# Patient Record
Sex: Female | Born: 1960
Health system: Southern US, Community
[De-identification: ages and names within clinical notes are randomized; demographics above are authoritative.]

## PROBLEM LIST (undated history)

## (undated) ENCOUNTER — Emergency Department (HOSPITAL_COMMUNITY): Discharge status: Absent without leave | Payer: BLUE CROSS/BLUE SHIELD

## (undated) DIAGNOSIS — I471 Supraventricular tachycardia, unspecified: Secondary | ICD-10-CM

## (undated) DIAGNOSIS — I493 Ventricular premature depolarization: Secondary | ICD-10-CM

## (undated) DIAGNOSIS — E739 Lactose intolerance, unspecified: Secondary | ICD-10-CM

## (undated) DIAGNOSIS — K219 Gastro-esophageal reflux disease without esophagitis: Secondary | ICD-10-CM

## (undated) DIAGNOSIS — R9230 Dense breasts, unspecified: Secondary | ICD-10-CM

## (undated) DIAGNOSIS — G4730 Sleep apnea, unspecified: Secondary | ICD-10-CM

## (undated) DIAGNOSIS — R011 Cardiac murmur, unspecified: Secondary | ICD-10-CM

## (undated) DIAGNOSIS — Z8742 Personal history of other diseases of the female genital tract: Secondary | ICD-10-CM

## (undated) DIAGNOSIS — E876 Hypokalemia: Secondary | ICD-10-CM

## (undated) DIAGNOSIS — I341 Nonrheumatic mitral (valve) prolapse: Secondary | ICD-10-CM

## (undated) DIAGNOSIS — R000 Tachycardia, unspecified: Secondary | ICD-10-CM

## (undated) DIAGNOSIS — R922 Inconclusive mammogram: Secondary | ICD-10-CM

## (undated) DIAGNOSIS — D649 Anemia, unspecified: Secondary | ICD-10-CM

## (undated) DIAGNOSIS — E079 Disorder of thyroid, unspecified: Secondary | ICD-10-CM

## (undated) DIAGNOSIS — M23306 Other meniscus derangements, unspecified meniscus, right knee: Secondary | ICD-10-CM

## (undated) DIAGNOSIS — K635 Polyp of colon: Secondary | ICD-10-CM

## (undated) DIAGNOSIS — F329 Major depressive disorder, single episode, unspecified: Secondary | ICD-10-CM

## (undated) DIAGNOSIS — A630 Anogenital (venereal) warts: Secondary | ICD-10-CM

## (undated) DIAGNOSIS — Z9889 Other specified postprocedural states: Secondary | ICD-10-CM

## (undated) DIAGNOSIS — F419 Anxiety disorder, unspecified: Secondary | ICD-10-CM

## (undated) DIAGNOSIS — R7611 Nonspecific reaction to tuberculin skin test without active tuberculosis: Secondary | ICD-10-CM

## (undated) DIAGNOSIS — F32A Depression, unspecified: Secondary | ICD-10-CM

## (undated) DIAGNOSIS — IMO0001 Reserved for inherently not codable concepts without codable children: Secondary | ICD-10-CM

## (undated) HISTORY — DX: Anemia, unspecified: D64.9

## (undated) HISTORY — DX: Sleep apnea, unspecified: G47.30

## (undated) HISTORY — DX: Lactose intolerance, unspecified: E73.9

## (undated) HISTORY — DX: Dense breasts, unspecified: R92.30

## (undated) HISTORY — DX: Inconclusive mammogram: R92.2

## (undated) HISTORY — PX: NOVASURE ABLATION: SHX5394

## (undated) HISTORY — DX: Other meniscus derangements, unspecified meniscus, right knee: M23.306

## (undated) HISTORY — DX: Depression, unspecified: F32.A

## (undated) HISTORY — DX: Anxiety disorder, unspecified: F41.9

## (undated) HISTORY — DX: Anogenital (venereal) warts: A63.0

## (undated) HISTORY — DX: Nonspecific reaction to tuberculin skin test without active tuberculosis: R76.11

## (undated) HISTORY — DX: Personal history of other diseases of the female genital tract: Z87.42

## (undated) HISTORY — DX: Cardiac murmur, unspecified: R01.1

## (undated) HISTORY — PX: CARDIAC CATHETERIZATION: SHX172

## (undated) HISTORY — DX: Major depressive disorder, single episode, unspecified: F32.9

## (undated) HISTORY — DX: Disorder of thyroid, unspecified: E07.9

## (undated) HISTORY — DX: Other specified postprocedural states: Z98.890

## (undated) HISTORY — DX: Polyp of colon: K63.5

---

## 1998-07-15 ENCOUNTER — Emergency Department (HOSPITAL_COMMUNITY): Admission: EM | Admit: 1998-07-15 | Discharge: 1998-07-15 | Payer: Self-pay | Admitting: Internal Medicine

## 1999-05-19 ENCOUNTER — Encounter: Payer: Self-pay | Admitting: Obstetrics & Gynecology

## 1999-05-19 ENCOUNTER — Ambulatory Visit (HOSPITAL_COMMUNITY): Admission: RE | Admit: 1999-05-19 | Discharge: 1999-05-19 | Payer: Self-pay | Admitting: Oral Surgery

## 2000-01-14 ENCOUNTER — Ambulatory Visit (HOSPITAL_COMMUNITY): Admission: RE | Admit: 2000-01-14 | Discharge: 2000-01-14 | Payer: Self-pay | Admitting: Radiation Oncology

## 2000-03-09 ENCOUNTER — Encounter (INDEPENDENT_AMBULATORY_CARE_PROVIDER_SITE_OTHER): Payer: Self-pay

## 2000-03-09 ENCOUNTER — Other Ambulatory Visit: Admission: RE | Admit: 2000-03-09 | Discharge: 2000-03-09 | Payer: Self-pay | Admitting: Obstetrics and Gynecology

## 2000-09-15 ENCOUNTER — Encounter: Payer: Self-pay | Admitting: Emergency Medicine

## 2000-09-15 ENCOUNTER — Emergency Department (HOSPITAL_COMMUNITY): Admission: EM | Admit: 2000-09-15 | Discharge: 2000-09-15 | Payer: Self-pay | Admitting: Emergency Medicine

## 2000-11-15 ENCOUNTER — Other Ambulatory Visit: Admission: RE | Admit: 2000-11-15 | Discharge: 2000-11-15 | Payer: Self-pay | Admitting: Obstetrics and Gynecology

## 2001-05-23 ENCOUNTER — Ambulatory Visit (HOSPITAL_COMMUNITY): Admission: RE | Admit: 2001-05-23 | Discharge: 2001-05-23 | Payer: Self-pay | Admitting: Internal Medicine

## 2001-05-23 ENCOUNTER — Encounter: Payer: Self-pay | Admitting: Internal Medicine

## 2001-06-08 ENCOUNTER — Other Ambulatory Visit: Admission: RE | Admit: 2001-06-08 | Discharge: 2001-06-08 | Payer: Self-pay | Admitting: Obstetrics and Gynecology

## 2001-06-19 ENCOUNTER — Emergency Department (HOSPITAL_COMMUNITY): Admission: EM | Admit: 2001-06-19 | Discharge: 2001-06-19 | Payer: Self-pay | Admitting: Emergency Medicine

## 2001-09-01 ENCOUNTER — Ambulatory Visit (HOSPITAL_COMMUNITY): Admission: RE | Admit: 2001-09-01 | Discharge: 2001-09-01 | Payer: Self-pay | Admitting: Obstetrics and Gynecology

## 2001-09-01 ENCOUNTER — Encounter: Payer: Self-pay | Admitting: Obstetrics and Gynecology

## 2002-06-06 ENCOUNTER — Emergency Department (HOSPITAL_COMMUNITY): Admission: EM | Admit: 2002-06-06 | Discharge: 2002-06-06 | Payer: Self-pay | Admitting: Emergency Medicine

## 2002-06-11 ENCOUNTER — Emergency Department (HOSPITAL_COMMUNITY): Admission: EM | Admit: 2002-06-11 | Discharge: 2002-06-11 | Payer: Self-pay | Admitting: Emergency Medicine

## 2002-06-12 ENCOUNTER — Emergency Department (HOSPITAL_COMMUNITY): Admission: EM | Admit: 2002-06-12 | Discharge: 2002-06-12 | Payer: Self-pay | Admitting: Emergency Medicine

## 2002-06-14 ENCOUNTER — Encounter: Admission: RE | Admit: 2002-06-14 | Discharge: 2002-06-14 | Payer: Self-pay | Admitting: Internal Medicine

## 2002-06-22 ENCOUNTER — Encounter: Payer: Self-pay | Admitting: Emergency Medicine

## 2002-06-22 ENCOUNTER — Encounter: Admission: RE | Admit: 2002-06-22 | Discharge: 2002-06-22 | Payer: Self-pay | Admitting: Emergency Medicine

## 2002-07-27 ENCOUNTER — Other Ambulatory Visit: Admission: RE | Admit: 2002-07-27 | Discharge: 2002-07-27 | Payer: Self-pay | Admitting: Obstetrics and Gynecology

## 2002-08-22 ENCOUNTER — Encounter: Admission: RE | Admit: 2002-08-22 | Discharge: 2002-09-18 | Payer: Self-pay | Admitting: Occupational Medicine

## 2003-01-09 ENCOUNTER — Ambulatory Visit (HOSPITAL_COMMUNITY): Admission: RE | Admit: 2003-01-09 | Discharge: 2003-01-09 | Payer: Self-pay | Admitting: Gastroenterology

## 2003-04-23 ENCOUNTER — Emergency Department (HOSPITAL_COMMUNITY): Admission: EM | Admit: 2003-04-23 | Discharge: 2003-04-24 | Payer: Self-pay | Admitting: Emergency Medicine

## 2003-04-24 ENCOUNTER — Encounter: Payer: Self-pay | Admitting: Emergency Medicine

## 2003-06-03 ENCOUNTER — Emergency Department (HOSPITAL_COMMUNITY): Admission: EM | Admit: 2003-06-03 | Discharge: 2003-06-03 | Payer: Self-pay | Admitting: Emergency Medicine

## 2003-08-16 ENCOUNTER — Other Ambulatory Visit: Admission: RE | Admit: 2003-08-16 | Discharge: 2003-08-16 | Payer: Self-pay | Admitting: Obstetrics and Gynecology

## 2003-09-24 ENCOUNTER — Ambulatory Visit (HOSPITAL_COMMUNITY): Admission: RE | Admit: 2003-09-24 | Discharge: 2003-09-24 | Payer: Self-pay | Admitting: Obstetrics and Gynecology

## 2003-10-18 ENCOUNTER — Emergency Department (HOSPITAL_COMMUNITY): Admission: EM | Admit: 2003-10-18 | Discharge: 2003-10-18 | Payer: Self-pay | Admitting: Emergency Medicine

## 2004-05-08 ENCOUNTER — Emergency Department (HOSPITAL_COMMUNITY): Admission: EM | Admit: 2004-05-08 | Discharge: 2004-05-08 | Payer: Self-pay | Admitting: Emergency Medicine

## 2004-05-15 ENCOUNTER — Other Ambulatory Visit: Admission: RE | Admit: 2004-05-15 | Discharge: 2004-05-15 | Payer: Self-pay | Admitting: Obstetrics and Gynecology

## 2004-05-30 ENCOUNTER — Emergency Department (HOSPITAL_COMMUNITY): Admission: EM | Admit: 2004-05-30 | Discharge: 2004-05-30 | Payer: Self-pay | Admitting: Emergency Medicine

## 2004-08-21 ENCOUNTER — Other Ambulatory Visit: Admission: RE | Admit: 2004-08-21 | Discharge: 2004-08-21 | Payer: Self-pay | Admitting: Obstetrics and Gynecology

## 2004-08-22 ENCOUNTER — Emergency Department (HOSPITAL_COMMUNITY): Admission: EM | Admit: 2004-08-22 | Discharge: 2004-08-22 | Payer: Self-pay | Admitting: *Deleted

## 2004-09-06 ENCOUNTER — Emergency Department (HOSPITAL_COMMUNITY): Admission: EM | Admit: 2004-09-06 | Discharge: 2004-09-06 | Payer: Self-pay

## 2004-09-13 ENCOUNTER — Emergency Department (HOSPITAL_COMMUNITY): Admission: EM | Admit: 2004-09-13 | Discharge: 2004-09-13 | Payer: Self-pay | Admitting: Family Medicine

## 2004-10-30 ENCOUNTER — Ambulatory Visit (HOSPITAL_COMMUNITY): Admission: RE | Admit: 2004-10-30 | Discharge: 2004-10-30 | Payer: Self-pay | Admitting: Obstetrics and Gynecology

## 2004-11-06 ENCOUNTER — Encounter: Admission: RE | Admit: 2004-11-06 | Discharge: 2004-11-06 | Payer: Self-pay | Admitting: Obstetrics and Gynecology

## 2004-11-19 ENCOUNTER — Emergency Department (HOSPITAL_COMMUNITY): Admission: EM | Admit: 2004-11-19 | Discharge: 2004-11-20 | Payer: Self-pay | Admitting: Emergency Medicine

## 2004-11-22 ENCOUNTER — Emergency Department (HOSPITAL_COMMUNITY): Admission: EM | Admit: 2004-11-22 | Discharge: 2004-11-22 | Payer: Self-pay | Admitting: Emergency Medicine

## 2005-01-10 ENCOUNTER — Emergency Department (HOSPITAL_COMMUNITY): Admission: EM | Admit: 2005-01-10 | Discharge: 2005-01-10 | Payer: Self-pay | Admitting: Emergency Medicine

## 2005-02-01 ENCOUNTER — Emergency Department (HOSPITAL_COMMUNITY): Admission: EM | Admit: 2005-02-01 | Discharge: 2005-02-02 | Payer: Self-pay | Admitting: *Deleted

## 2005-03-23 ENCOUNTER — Emergency Department (HOSPITAL_COMMUNITY): Admission: EM | Admit: 2005-03-23 | Discharge: 2005-03-23 | Payer: Self-pay | Admitting: Emergency Medicine

## 2005-04-11 ENCOUNTER — Emergency Department (HOSPITAL_COMMUNITY): Admission: EM | Admit: 2005-04-11 | Discharge: 2005-04-11 | Payer: Self-pay | Admitting: Emergency Medicine

## 2005-05-14 ENCOUNTER — Other Ambulatory Visit: Admission: RE | Admit: 2005-05-14 | Discharge: 2005-05-14 | Payer: Self-pay | Admitting: Obstetrics and Gynecology

## 2005-06-19 ENCOUNTER — Emergency Department (HOSPITAL_COMMUNITY): Admission: EM | Admit: 2005-06-19 | Discharge: 2005-06-20 | Payer: Self-pay | Admitting: Emergency Medicine

## 2005-08-09 ENCOUNTER — Encounter: Payer: Self-pay | Admitting: Internal Medicine

## 2005-08-09 ENCOUNTER — Ambulatory Visit (HOSPITAL_BASED_OUTPATIENT_CLINIC_OR_DEPARTMENT_OTHER): Admission: RE | Admit: 2005-08-09 | Discharge: 2005-08-09 | Payer: Self-pay | Admitting: Internal Medicine

## 2005-08-15 ENCOUNTER — Ambulatory Visit: Payer: Self-pay | Admitting: Internal Medicine

## 2005-09-05 ENCOUNTER — Emergency Department (HOSPITAL_COMMUNITY): Admission: EM | Admit: 2005-09-05 | Discharge: 2005-09-05 | Payer: Self-pay | Admitting: Emergency Medicine

## 2005-11-02 ENCOUNTER — Ambulatory Visit (HOSPITAL_COMMUNITY): Admission: RE | Admit: 2005-11-02 | Discharge: 2005-11-02 | Payer: Self-pay | Admitting: Obstetrics and Gynecology

## 2005-11-06 ENCOUNTER — Emergency Department (HOSPITAL_COMMUNITY): Admission: EM | Admit: 2005-11-06 | Discharge: 2005-11-06 | Payer: Self-pay | Admitting: Emergency Medicine

## 2005-11-11 ENCOUNTER — Ambulatory Visit (HOSPITAL_COMMUNITY): Admission: RE | Admit: 2005-11-11 | Discharge: 2005-11-11 | Payer: Self-pay | Admitting: Emergency Medicine

## 2005-11-25 ENCOUNTER — Emergency Department (HOSPITAL_COMMUNITY): Admission: EM | Admit: 2005-11-25 | Discharge: 2005-11-25 | Payer: Self-pay | Admitting: Emergency Medicine

## 2005-12-08 ENCOUNTER — Emergency Department (HOSPITAL_COMMUNITY): Admission: EM | Admit: 2005-12-08 | Discharge: 2005-12-08 | Payer: Self-pay | Admitting: Emergency Medicine

## 2005-12-13 ENCOUNTER — Encounter: Admission: RE | Admit: 2005-12-13 | Discharge: 2005-12-13 | Payer: Self-pay | Admitting: General Surgery

## 2005-12-16 ENCOUNTER — Other Ambulatory Visit: Admission: RE | Admit: 2005-12-16 | Discharge: 2005-12-16 | Payer: Self-pay | Admitting: Diagnostic Radiology

## 2005-12-16 ENCOUNTER — Encounter (INDEPENDENT_AMBULATORY_CARE_PROVIDER_SITE_OTHER): Payer: Self-pay | Admitting: Specialist

## 2005-12-16 ENCOUNTER — Encounter: Admission: RE | Admit: 2005-12-16 | Discharge: 2005-12-16 | Payer: Self-pay | Admitting: General Surgery

## 2005-12-20 ENCOUNTER — Observation Stay (HOSPITAL_COMMUNITY): Admission: AD | Admit: 2005-12-20 | Discharge: 2005-12-21 | Payer: Self-pay | Admitting: Cardiovascular Disease

## 2005-12-20 ENCOUNTER — Encounter: Payer: Self-pay | Admitting: Emergency Medicine

## 2006-01-30 ENCOUNTER — Emergency Department (HOSPITAL_COMMUNITY): Admission: EM | Admit: 2006-01-30 | Discharge: 2006-01-30 | Payer: Self-pay | Admitting: Emergency Medicine

## 2006-03-07 ENCOUNTER — Emergency Department (HOSPITAL_COMMUNITY): Admission: EM | Admit: 2006-03-07 | Discharge: 2006-03-07 | Payer: Self-pay | Admitting: Emergency Medicine

## 2006-05-07 ENCOUNTER — Emergency Department (HOSPITAL_COMMUNITY): Admission: EM | Admit: 2006-05-07 | Discharge: 2006-05-07 | Payer: Self-pay | Admitting: Emergency Medicine

## 2006-05-15 ENCOUNTER — Emergency Department: Payer: Self-pay | Admitting: Emergency Medicine

## 2006-05-26 ENCOUNTER — Encounter: Admission: RE | Admit: 2006-05-26 | Discharge: 2006-05-26 | Payer: Self-pay | Admitting: General Surgery

## 2006-05-28 ENCOUNTER — Emergency Department (HOSPITAL_COMMUNITY): Admission: EM | Admit: 2006-05-28 | Discharge: 2006-05-28 | Payer: Self-pay | Admitting: Emergency Medicine

## 2006-06-07 ENCOUNTER — Emergency Department (HOSPITAL_COMMUNITY): Admission: EM | Admit: 2006-06-07 | Discharge: 2006-06-07 | Payer: Self-pay | Admitting: Emergency Medicine

## 2006-07-04 ENCOUNTER — Emergency Department (HOSPITAL_COMMUNITY): Admission: EM | Admit: 2006-07-04 | Discharge: 2006-07-05 | Payer: Self-pay | Admitting: Emergency Medicine

## 2006-07-18 ENCOUNTER — Emergency Department (HOSPITAL_COMMUNITY): Admission: EM | Admit: 2006-07-18 | Discharge: 2006-07-18 | Payer: Self-pay | Admitting: Family Medicine

## 2006-08-01 ENCOUNTER — Emergency Department (HOSPITAL_COMMUNITY): Admission: EM | Admit: 2006-08-01 | Discharge: 2006-08-02 | Payer: Self-pay | Admitting: Emergency Medicine

## 2006-08-15 ENCOUNTER — Emergency Department (HOSPITAL_COMMUNITY): Admission: EM | Admit: 2006-08-15 | Discharge: 2006-08-15 | Payer: Self-pay | Admitting: Emergency Medicine

## 2006-11-15 ENCOUNTER — Encounter: Admission: RE | Admit: 2006-11-15 | Discharge: 2006-11-15 | Payer: Self-pay | Admitting: Obstetrics and Gynecology

## 2006-11-15 ENCOUNTER — Emergency Department (HOSPITAL_COMMUNITY): Admission: EM | Admit: 2006-11-15 | Discharge: 2006-11-15 | Payer: Self-pay | Admitting: Emergency Medicine

## 2006-12-27 ENCOUNTER — Emergency Department (HOSPITAL_COMMUNITY): Admission: EM | Admit: 2006-12-27 | Discharge: 2006-12-27 | Payer: Self-pay | Admitting: Emergency Medicine

## 2006-12-28 ENCOUNTER — Emergency Department (HOSPITAL_COMMUNITY): Admission: EM | Admit: 2006-12-28 | Discharge: 2006-12-29 | Payer: Self-pay | Admitting: Emergency Medicine

## 2007-04-09 ENCOUNTER — Emergency Department (HOSPITAL_COMMUNITY): Admission: EM | Admit: 2007-04-09 | Discharge: 2007-04-10 | Payer: Self-pay | Admitting: Emergency Medicine

## 2007-05-14 ENCOUNTER — Emergency Department (HOSPITAL_COMMUNITY): Admission: EM | Admit: 2007-05-14 | Discharge: 2007-05-15 | Payer: Self-pay | Admitting: Emergency Medicine

## 2007-05-26 ENCOUNTER — Emergency Department (HOSPITAL_COMMUNITY): Admission: EM | Admit: 2007-05-26 | Discharge: 2007-05-26 | Payer: Self-pay | Admitting: Emergency Medicine

## 2007-06-23 ENCOUNTER — Encounter: Admission: RE | Admit: 2007-06-23 | Discharge: 2007-06-23 | Payer: Self-pay | Admitting: Orthopedic Surgery

## 2007-07-10 ENCOUNTER — Ambulatory Visit (HOSPITAL_COMMUNITY): Admission: RE | Admit: 2007-07-10 | Discharge: 2007-07-10 | Payer: Self-pay | Admitting: Cardiovascular Disease

## 2007-07-18 ENCOUNTER — Encounter: Payer: Self-pay | Admitting: Internal Medicine

## 2007-07-18 ENCOUNTER — Ambulatory Visit (HOSPITAL_BASED_OUTPATIENT_CLINIC_OR_DEPARTMENT_OTHER): Admission: RE | Admit: 2007-07-18 | Discharge: 2007-07-18 | Payer: Self-pay | Admitting: Internal Medicine

## 2007-07-22 ENCOUNTER — Emergency Department (HOSPITAL_COMMUNITY): Admission: EM | Admit: 2007-07-22 | Discharge: 2007-07-22 | Payer: Self-pay | Admitting: Family Medicine

## 2007-07-23 ENCOUNTER — Ambulatory Visit: Payer: Self-pay | Admitting: Internal Medicine

## 2007-07-27 ENCOUNTER — Ambulatory Visit (HOSPITAL_COMMUNITY): Admission: RE | Admit: 2007-07-27 | Discharge: 2007-07-27 | Payer: Self-pay | Admitting: Cardiology

## 2007-08-08 ENCOUNTER — Emergency Department (HOSPITAL_COMMUNITY): Admission: EM | Admit: 2007-08-08 | Discharge: 2007-08-08 | Payer: Self-pay | Admitting: Emergency Medicine

## 2007-09-05 ENCOUNTER — Emergency Department (HOSPITAL_COMMUNITY): Admission: EM | Admit: 2007-09-05 | Discharge: 2007-09-06 | Payer: Self-pay | Admitting: Internal Medicine

## 2007-09-14 DIAGNOSIS — Z9889 Other specified postprocedural states: Secondary | ICD-10-CM

## 2007-09-14 HISTORY — DX: Other specified postprocedural states: Z98.890

## 2007-10-24 ENCOUNTER — Emergency Department (HOSPITAL_COMMUNITY): Admission: EM | Admit: 2007-10-24 | Discharge: 2007-10-24 | Payer: Self-pay | Admitting: Emergency Medicine

## 2007-10-28 ENCOUNTER — Emergency Department (HOSPITAL_COMMUNITY): Admission: EM | Admit: 2007-10-28 | Discharge: 2007-10-28 | Payer: Self-pay | Admitting: Emergency Medicine

## 2007-11-07 ENCOUNTER — Emergency Department (HOSPITAL_COMMUNITY): Admission: EM | Admit: 2007-11-07 | Discharge: 2007-11-07 | Payer: Self-pay | Admitting: Family Medicine

## 2007-11-08 ENCOUNTER — Emergency Department (HOSPITAL_COMMUNITY): Admission: EM | Admit: 2007-11-08 | Discharge: 2007-11-08 | Payer: Self-pay | Admitting: Emergency Medicine

## 2007-11-09 ENCOUNTER — Ambulatory Visit: Payer: Self-pay | Admitting: Internal Medicine

## 2007-11-12 ENCOUNTER — Emergency Department (HOSPITAL_COMMUNITY): Admission: EM | Admit: 2007-11-12 | Discharge: 2007-11-12 | Payer: Self-pay | Admitting: Family Medicine

## 2007-12-22 ENCOUNTER — Emergency Department: Payer: Self-pay | Admitting: Emergency Medicine

## 2007-12-22 ENCOUNTER — Emergency Department (HOSPITAL_COMMUNITY): Admission: EM | Admit: 2007-12-22 | Discharge: 2007-12-22 | Payer: Self-pay | Admitting: Family Medicine

## 2007-12-23 ENCOUNTER — Emergency Department (HOSPITAL_COMMUNITY): Admission: EM | Admit: 2007-12-23 | Discharge: 2007-12-23 | Payer: Self-pay | Admitting: Emergency Medicine

## 2007-12-26 ENCOUNTER — Emergency Department: Payer: Self-pay | Admitting: Emergency Medicine

## 2008-01-15 ENCOUNTER — Telehealth (INDEPENDENT_AMBULATORY_CARE_PROVIDER_SITE_OTHER): Payer: Self-pay | Admitting: *Deleted

## 2008-01-16 ENCOUNTER — Telehealth (INDEPENDENT_AMBULATORY_CARE_PROVIDER_SITE_OTHER): Payer: Self-pay | Admitting: *Deleted

## 2008-01-18 DIAGNOSIS — K5900 Constipation, unspecified: Secondary | ICD-10-CM | POA: Insufficient documentation

## 2008-01-18 DIAGNOSIS — R1013 Epigastric pain: Secondary | ICD-10-CM

## 2008-01-18 DIAGNOSIS — K589 Irritable bowel syndrome without diarrhea: Secondary | ICD-10-CM | POA: Insufficient documentation

## 2008-01-18 DIAGNOSIS — K3189 Other diseases of stomach and duodenum: Secondary | ICD-10-CM | POA: Insufficient documentation

## 2008-01-18 DIAGNOSIS — K219 Gastro-esophageal reflux disease without esophagitis: Secondary | ICD-10-CM | POA: Insufficient documentation

## 2008-01-19 ENCOUNTER — Ambulatory Visit: Payer: Self-pay | Admitting: Internal Medicine

## 2008-01-19 DIAGNOSIS — G4730 Sleep apnea, unspecified: Secondary | ICD-10-CM | POA: Insufficient documentation

## 2008-01-22 ENCOUNTER — Emergency Department: Payer: Self-pay | Admitting: Emergency Medicine

## 2008-01-23 ENCOUNTER — Encounter: Payer: Self-pay | Admitting: Internal Medicine

## 2008-01-23 ENCOUNTER — Ambulatory Visit (HOSPITAL_BASED_OUTPATIENT_CLINIC_OR_DEPARTMENT_OTHER): Admission: RE | Admit: 2008-01-23 | Discharge: 2008-01-23 | Payer: Self-pay | Admitting: Internal Medicine

## 2008-01-24 ENCOUNTER — Emergency Department: Payer: Self-pay | Admitting: Emergency Medicine

## 2008-02-01 ENCOUNTER — Emergency Department (HOSPITAL_COMMUNITY): Admission: EM | Admit: 2008-02-01 | Discharge: 2008-02-01 | Payer: Self-pay | Admitting: Emergency Medicine

## 2008-02-06 ENCOUNTER — Ambulatory Visit: Payer: Self-pay | Admitting: Internal Medicine

## 2008-02-10 ENCOUNTER — Emergency Department (HOSPITAL_COMMUNITY): Admission: EM | Admit: 2008-02-10 | Discharge: 2008-02-11 | Payer: Self-pay | Admitting: Emergency Medicine

## 2008-02-12 ENCOUNTER — Telehealth: Payer: Self-pay | Admitting: Internal Medicine

## 2008-02-16 ENCOUNTER — Telehealth: Payer: Self-pay | Admitting: Internal Medicine

## 2008-02-22 ENCOUNTER — Ambulatory Visit: Payer: Self-pay | Admitting: Internal Medicine

## 2008-02-22 ENCOUNTER — Ambulatory Visit: Payer: Self-pay

## 2008-02-28 ENCOUNTER — Telehealth: Payer: Self-pay | Admitting: Internal Medicine

## 2008-03-07 ENCOUNTER — Ambulatory Visit (HOSPITAL_COMMUNITY): Admission: RE | Admit: 2008-03-07 | Discharge: 2008-03-07 | Payer: Self-pay | Admitting: Obstetrics and Gynecology

## 2008-03-12 ENCOUNTER — Other Ambulatory Visit (HOSPITAL_COMMUNITY): Admission: RE | Admit: 2008-03-12 | Discharge: 2008-04-19 | Payer: Self-pay | Admitting: Psychiatry

## 2008-03-13 ENCOUNTER — Ambulatory Visit: Payer: Self-pay | Admitting: Psychiatry

## 2008-03-14 ENCOUNTER — Telehealth: Payer: Self-pay | Admitting: Internal Medicine

## 2008-04-08 ENCOUNTER — Emergency Department (HOSPITAL_COMMUNITY): Admission: EM | Admit: 2008-04-08 | Discharge: 2008-04-09 | Payer: Self-pay | Admitting: Emergency Medicine

## 2008-04-10 ENCOUNTER — Ambulatory Visit: Payer: Self-pay | Admitting: Cardiology

## 2008-04-22 ENCOUNTER — Ambulatory Visit (HOSPITAL_COMMUNITY): Admission: RE | Admit: 2008-04-22 | Discharge: 2008-04-22 | Payer: Self-pay | Admitting: Family Medicine

## 2008-04-23 ENCOUNTER — Encounter: Admission: RE | Admit: 2008-04-23 | Discharge: 2008-04-23 | Payer: Self-pay | Admitting: Family Medicine

## 2008-04-29 ENCOUNTER — Ambulatory Visit: Payer: Self-pay | Admitting: Internal Medicine

## 2008-05-24 ENCOUNTER — Ambulatory Visit: Payer: Self-pay | Admitting: Internal Medicine

## 2008-05-25 ENCOUNTER — Emergency Department (HOSPITAL_COMMUNITY): Admission: EM | Admit: 2008-05-25 | Discharge: 2008-05-25 | Payer: Self-pay | Admitting: Emergency Medicine

## 2008-05-28 ENCOUNTER — Emergency Department (HOSPITAL_COMMUNITY): Admission: EM | Admit: 2008-05-28 | Discharge: 2008-05-28 | Payer: Self-pay | Admitting: Emergency Medicine

## 2008-06-03 ENCOUNTER — Ambulatory Visit (HOSPITAL_COMMUNITY): Admission: RE | Admit: 2008-06-03 | Discharge: 2008-06-03 | Payer: Self-pay | Admitting: Interventional Cardiology

## 2008-06-07 ENCOUNTER — Encounter: Payer: Self-pay | Admitting: Emergency Medicine

## 2008-06-07 ENCOUNTER — Observation Stay (HOSPITAL_COMMUNITY): Admission: AD | Admit: 2008-06-07 | Discharge: 2008-06-08 | Payer: Self-pay | Admitting: Interventional Cardiology

## 2008-06-25 ENCOUNTER — Ambulatory Visit (HOSPITAL_COMMUNITY): Payer: Self-pay | Admitting: Marriage and Family Therapist

## 2008-07-03 ENCOUNTER — Ambulatory Visit (HOSPITAL_COMMUNITY): Payer: Self-pay | Admitting: Psychiatry

## 2008-07-15 ENCOUNTER — Ambulatory Visit (HOSPITAL_COMMUNITY): Payer: Self-pay | Admitting: Marriage and Family Therapist

## 2008-07-22 ENCOUNTER — Ambulatory Visit (HOSPITAL_COMMUNITY): Payer: Self-pay | Admitting: Marriage and Family Therapist

## 2008-07-29 ENCOUNTER — Ambulatory Visit (HOSPITAL_COMMUNITY): Payer: Self-pay | Admitting: Marriage and Family Therapist

## 2008-08-05 ENCOUNTER — Ambulatory Visit (HOSPITAL_COMMUNITY): Payer: Self-pay | Admitting: Marriage and Family Therapist

## 2008-08-07 ENCOUNTER — Ambulatory Visit (HOSPITAL_COMMUNITY): Payer: Self-pay | Admitting: Psychiatry

## 2008-08-09 ENCOUNTER — Emergency Department (HOSPITAL_COMMUNITY): Admission: EM | Admit: 2008-08-09 | Discharge: 2008-08-09 | Payer: Self-pay | Admitting: Emergency Medicine

## 2008-08-12 ENCOUNTER — Ambulatory Visit (HOSPITAL_COMMUNITY): Payer: Self-pay | Admitting: Marriage and Family Therapist

## 2008-08-21 ENCOUNTER — Ambulatory Visit (HOSPITAL_COMMUNITY): Payer: Self-pay | Admitting: Marriage and Family Therapist

## 2008-08-26 ENCOUNTER — Ambulatory Visit (HOSPITAL_COMMUNITY): Payer: Self-pay | Admitting: Marriage and Family Therapist

## 2008-09-02 ENCOUNTER — Ambulatory Visit (HOSPITAL_COMMUNITY): Payer: Self-pay | Admitting: Marriage and Family Therapist

## 2008-09-09 ENCOUNTER — Ambulatory Visit (HOSPITAL_COMMUNITY): Payer: Self-pay | Admitting: Marriage and Family Therapist

## 2008-09-17 ENCOUNTER — Emergency Department (HOSPITAL_COMMUNITY): Admission: EM | Admit: 2008-09-17 | Discharge: 2008-09-18 | Payer: Self-pay | Admitting: Emergency Medicine

## 2008-09-17 ENCOUNTER — Ambulatory Visit (HOSPITAL_COMMUNITY): Payer: Self-pay | Admitting: Marriage and Family Therapist

## 2008-09-18 ENCOUNTER — Ambulatory Visit: Payer: Self-pay | Admitting: Psychiatry

## 2008-09-18 ENCOUNTER — Inpatient Hospital Stay (HOSPITAL_COMMUNITY): Admission: AD | Admit: 2008-09-18 | Discharge: 2008-09-24 | Payer: Self-pay | Admitting: Psychiatry

## 2008-09-25 ENCOUNTER — Emergency Department (HOSPITAL_COMMUNITY): Admission: EM | Admit: 2008-09-25 | Discharge: 2008-09-26 | Payer: Self-pay | Admitting: Emergency Medicine

## 2008-09-27 ENCOUNTER — Emergency Department (HOSPITAL_COMMUNITY): Admission: EM | Admit: 2008-09-27 | Discharge: 2008-09-27 | Payer: Self-pay | Admitting: Emergency Medicine

## 2008-10-02 ENCOUNTER — Ambulatory Visit (HOSPITAL_COMMUNITY): Payer: Self-pay | Admitting: Marriage and Family Therapist

## 2008-10-09 ENCOUNTER — Ambulatory Visit (HOSPITAL_COMMUNITY): Payer: Self-pay | Admitting: Marriage and Family Therapist

## 2008-10-22 ENCOUNTER — Ambulatory Visit (HOSPITAL_COMMUNITY): Payer: Self-pay | Admitting: Marriage and Family Therapist

## 2008-10-28 ENCOUNTER — Ambulatory Visit (HOSPITAL_COMMUNITY): Payer: Self-pay | Admitting: Marriage and Family Therapist

## 2008-11-06 ENCOUNTER — Ambulatory Visit (HOSPITAL_COMMUNITY): Payer: Self-pay | Admitting: Marriage and Family Therapist

## 2008-11-11 ENCOUNTER — Ambulatory Visit (HOSPITAL_COMMUNITY): Payer: Self-pay | Admitting: Marriage and Family Therapist

## 2008-11-19 ENCOUNTER — Ambulatory Visit (HOSPITAL_COMMUNITY): Payer: Self-pay | Admitting: Marriage and Family Therapist

## 2008-11-19 ENCOUNTER — Emergency Department (HOSPITAL_COMMUNITY): Admission: EM | Admit: 2008-11-19 | Discharge: 2008-11-19 | Payer: Self-pay | Admitting: Emergency Medicine

## 2008-11-21 ENCOUNTER — Inpatient Hospital Stay (HOSPITAL_COMMUNITY): Admission: EM | Admit: 2008-11-21 | Discharge: 2008-11-22 | Payer: Self-pay | Admitting: Emergency Medicine

## 2008-11-24 ENCOUNTER — Emergency Department (HOSPITAL_COMMUNITY): Admission: EM | Admit: 2008-11-24 | Discharge: 2008-11-24 | Payer: Self-pay | Admitting: Emergency Medicine

## 2008-11-26 ENCOUNTER — Ambulatory Visit (HOSPITAL_COMMUNITY): Payer: Self-pay | Admitting: Marriage and Family Therapist

## 2008-12-02 ENCOUNTER — Ambulatory Visit (HOSPITAL_COMMUNITY): Payer: Self-pay | Admitting: Marriage and Family Therapist

## 2008-12-05 ENCOUNTER — Ambulatory Visit (HOSPITAL_COMMUNITY): Payer: Self-pay | Admitting: Marriage and Family Therapist

## 2008-12-10 ENCOUNTER — Ambulatory Visit (HOSPITAL_COMMUNITY): Payer: Self-pay | Admitting: Marriage and Family Therapist

## 2008-12-17 ENCOUNTER — Emergency Department (HOSPITAL_COMMUNITY): Admission: EM | Admit: 2008-12-17 | Discharge: 2008-12-17 | Payer: Self-pay | Admitting: Family Medicine

## 2008-12-20 ENCOUNTER — Emergency Department (HOSPITAL_COMMUNITY): Admission: EM | Admit: 2008-12-20 | Discharge: 2008-12-20 | Payer: Self-pay | Admitting: Emergency Medicine

## 2008-12-24 ENCOUNTER — Ambulatory Visit (HOSPITAL_COMMUNITY): Payer: Self-pay | Admitting: Marriage and Family Therapist

## 2008-12-31 ENCOUNTER — Ambulatory Visit (HOSPITAL_COMMUNITY): Payer: Self-pay | Admitting: Marriage and Family Therapist

## 2009-01-07 ENCOUNTER — Ambulatory Visit (HOSPITAL_COMMUNITY): Payer: Self-pay | Admitting: Marriage and Family Therapist

## 2009-02-17 ENCOUNTER — Ambulatory Visit (HOSPITAL_COMMUNITY): Payer: Self-pay | Admitting: Marriage and Family Therapist

## 2009-05-08 ENCOUNTER — Emergency Department (HOSPITAL_COMMUNITY): Admission: EM | Admit: 2009-05-08 | Discharge: 2009-05-08 | Payer: Self-pay | Admitting: Emergency Medicine

## 2009-05-21 ENCOUNTER — Encounter: Admission: RE | Admit: 2009-05-21 | Discharge: 2009-05-21 | Payer: Self-pay | Admitting: Obstetrics and Gynecology

## 2009-06-09 ENCOUNTER — Ambulatory Visit (HOSPITAL_BASED_OUTPATIENT_CLINIC_OR_DEPARTMENT_OTHER): Admission: RE | Admit: 2009-06-09 | Discharge: 2009-06-09 | Payer: Self-pay | Admitting: Internal Medicine

## 2009-06-16 ENCOUNTER — Ambulatory Visit: Payer: Self-pay | Admitting: Internal Medicine

## 2009-06-16 ENCOUNTER — Encounter: Admission: RE | Admit: 2009-06-16 | Discharge: 2009-06-16 | Payer: Self-pay | Admitting: Family Medicine

## 2009-06-25 ENCOUNTER — Encounter: Admission: RE | Admit: 2009-06-25 | Discharge: 2009-06-25 | Payer: Self-pay | Admitting: Family Medicine

## 2009-07-03 ENCOUNTER — Emergency Department (HOSPITAL_COMMUNITY): Admission: EM | Admit: 2009-07-03 | Discharge: 2009-07-03 | Payer: Self-pay | Admitting: Emergency Medicine

## 2009-11-07 ENCOUNTER — Ambulatory Visit (HOSPITAL_COMMUNITY): Admission: RE | Admit: 2009-11-07 | Discharge: 2009-11-07 | Payer: Self-pay | Admitting: Cardiovascular Disease

## 2009-12-19 ENCOUNTER — Emergency Department (HOSPITAL_COMMUNITY): Admission: EM | Admit: 2009-12-19 | Discharge: 2009-12-20 | Payer: Self-pay | Admitting: Emergency Medicine

## 2010-01-12 ENCOUNTER — Emergency Department (HOSPITAL_COMMUNITY): Admission: EM | Admit: 2010-01-12 | Discharge: 2010-01-12 | Payer: Self-pay | Admitting: Family Medicine

## 2010-02-26 ENCOUNTER — Emergency Department (HOSPITAL_COMMUNITY): Admission: EM | Admit: 2010-02-26 | Discharge: 2010-02-26 | Payer: Self-pay | Admitting: Emergency Medicine

## 2010-04-05 ENCOUNTER — Emergency Department (HOSPITAL_COMMUNITY): Admission: EM | Admit: 2010-04-05 | Discharge: 2010-04-05 | Payer: Self-pay | Admitting: Family Medicine

## 2010-04-29 ENCOUNTER — Emergency Department (HOSPITAL_COMMUNITY): Admission: EM | Admit: 2010-04-29 | Discharge: 2010-04-29 | Payer: Self-pay | Admitting: Emergency Medicine

## 2010-05-07 ENCOUNTER — Emergency Department (HOSPITAL_COMMUNITY): Admission: EM | Admit: 2010-05-07 | Discharge: 2010-05-07 | Payer: Self-pay | Admitting: Emergency Medicine

## 2010-05-20 ENCOUNTER — Emergency Department (HOSPITAL_COMMUNITY): Admission: EM | Admit: 2010-05-20 | Discharge: 2010-05-20 | Payer: Self-pay | Admitting: Emergency Medicine

## 2010-06-18 ENCOUNTER — Ambulatory Visit (HOSPITAL_COMMUNITY): Payer: Self-pay | Admitting: Marriage and Family Therapist

## 2010-06-29 ENCOUNTER — Encounter: Admission: RE | Admit: 2010-06-29 | Discharge: 2010-06-29 | Payer: Self-pay | Admitting: Nephrology

## 2010-07-05 ENCOUNTER — Emergency Department (HOSPITAL_COMMUNITY): Admission: EM | Admit: 2010-07-05 | Discharge: 2010-07-05 | Payer: Self-pay | Admitting: Emergency Medicine

## 2010-07-22 ENCOUNTER — Emergency Department (HOSPITAL_COMMUNITY): Admission: EM | Admit: 2010-07-22 | Discharge: 2010-07-22 | Payer: Self-pay | Admitting: Emergency Medicine

## 2010-07-24 ENCOUNTER — Emergency Department (HOSPITAL_COMMUNITY): Admission: EM | Admit: 2010-07-24 | Discharge: 2010-07-24 | Payer: Self-pay | Admitting: Emergency Medicine

## 2010-08-27 ENCOUNTER — Emergency Department (HOSPITAL_COMMUNITY)
Admission: EM | Admit: 2010-08-27 | Discharge: 2010-08-27 | Payer: Self-pay | Source: Home / Self Care | Admitting: Emergency Medicine

## 2010-09-01 ENCOUNTER — Encounter
Admission: RE | Admit: 2010-09-01 | Discharge: 2010-09-01 | Payer: Self-pay | Source: Home / Self Care | Attending: Obstetrics and Gynecology | Admitting: Obstetrics and Gynecology

## 2010-09-13 LAB — HM MAMMOGRAPHY

## 2010-09-13 LAB — HM PAP SMEAR

## 2010-10-04 ENCOUNTER — Encounter: Payer: Self-pay | Admitting: Obstetrics and Gynecology

## 2010-10-04 ENCOUNTER — Encounter: Payer: Self-pay | Admitting: Family Medicine

## 2010-10-04 ENCOUNTER — Encounter: Payer: Self-pay | Admitting: Emergency Medicine

## 2010-11-15 ENCOUNTER — Inpatient Hospital Stay (INDEPENDENT_AMBULATORY_CARE_PROVIDER_SITE_OTHER)
Admission: RE | Admit: 2010-11-15 | Discharge: 2010-11-15 | Disposition: A | Discharge status: Home / Self Care | Payer: BC Managed Care – PPO | Source: Ambulatory Visit | Attending: Emergency Medicine | Admitting: Emergency Medicine

## 2010-11-15 DIAGNOSIS — R071 Chest pain on breathing: Secondary | ICD-10-CM

## 2010-11-15 LAB — POCT I-STAT, CHEM 8
BUN: 6 mg/dL (ref 6–23)
Chloride: 105 mEq/L (ref 96–112)
HCT: 37 % (ref 36.0–46.0)
Sodium: 143 mEq/L (ref 135–145)

## 2010-11-15 LAB — POCT URINALYSIS DIPSTICK
Bilirubin Urine: NEGATIVE
Glucose, UA: NEGATIVE mg/dL
Ketones, ur: NEGATIVE mg/dL
Nitrite: NEGATIVE

## 2010-11-23 LAB — DIFFERENTIAL
Basophils Absolute: 0 10*3/uL (ref 0.0–0.1)
Basophils Relative: 0 % (ref 0–1)
Eosinophils Absolute: 0.1 10*3/uL (ref 0.0–0.7)
Eosinophils Relative: 1 % (ref 0–5)
Monocytes Absolute: 0.7 10*3/uL (ref 0.1–1.0)
Monocytes Relative: 10 % (ref 3–12)
Neutro Abs: 5 10*3/uL (ref 1.7–7.7)

## 2010-11-23 LAB — POCT I-STAT, CHEM 8
BUN: 8 mg/dL (ref 6–23)
Calcium, Ion: 1.11 mmol/L — ABNORMAL LOW (ref 1.12–1.32)
Creatinine, Ser: 0.9 mg/dL (ref 0.4–1.2)
Glucose, Bld: 106 mg/dL — ABNORMAL HIGH (ref 70–99)
Hemoglobin: 13.3 g/dL (ref 12.0–15.0)
TCO2: 24 mmol/L (ref 0–100)

## 2010-11-23 LAB — URINALYSIS, ROUTINE W REFLEX MICROSCOPIC
Glucose, UA: NEGATIVE mg/dL
Ketones, ur: NEGATIVE mg/dL
Nitrite: NEGATIVE
Protein, ur: 30 mg/dL — AB
pH: 5.5 (ref 5.0–8.0)

## 2010-11-23 LAB — CBC
HCT: 34.5 % — ABNORMAL LOW (ref 36.0–46.0)
Hemoglobin: 10.9 g/dL — ABNORMAL LOW (ref 12.0–15.0)
MCH: 23.1 pg — ABNORMAL LOW (ref 26.0–34.0)
MCHC: 31.6 g/dL (ref 30.0–36.0)
RDW: 15.7 % — ABNORMAL HIGH (ref 11.5–15.5)

## 2010-11-23 LAB — URINE MICROSCOPIC-ADD ON

## 2010-11-24 LAB — URINALYSIS, ROUTINE W REFLEX MICROSCOPIC
Bilirubin Urine: NEGATIVE
Glucose, UA: NEGATIVE mg/dL
Hgb urine dipstick: NEGATIVE
Ketones, ur: NEGATIVE mg/dL
Protein, ur: NEGATIVE mg/dL
Urobilinogen, UA: 0.2 mg/dL (ref 0.0–1.0)

## 2010-11-24 LAB — POCT RAPID STREP A (OFFICE): Streptococcus, Group A Screen (Direct): NEGATIVE

## 2010-11-24 LAB — URINE MICROSCOPIC-ADD ON

## 2010-11-28 LAB — POCT URINALYSIS DIP (DEVICE)
Glucose, UA: NEGATIVE mg/dL
Ketones, ur: NEGATIVE mg/dL
Protein, ur: NEGATIVE mg/dL

## 2010-11-29 LAB — DIFFERENTIAL
Lymphocytes Relative: 23 % (ref 12–46)
Lymphs Abs: 1.8 10*3/uL (ref 0.7–4.0)
Monocytes Relative: 10 % (ref 3–12)
Neutrophils Relative %: 66 % (ref 43–77)

## 2010-11-29 LAB — POCT I-STAT, CHEM 8
HCT: 39 % (ref 36.0–46.0)
Hemoglobin: 13.3 g/dL (ref 12.0–15.0)
Potassium: 3.8 mEq/L (ref 3.5–5.1)
Sodium: 140 mEq/L (ref 135–145)

## 2010-11-29 LAB — CBC
HCT: 35.8 % — ABNORMAL LOW (ref 36.0–46.0)
Platelets: 224 10*3/uL (ref 150–400)
RBC: 4.7 MIL/uL (ref 3.87–5.11)
WBC: 7.7 10*3/uL (ref 4.0–10.5)

## 2010-11-29 LAB — PROTIME-INR
INR: 0.99 (ref 0.00–1.49)
Prothrombin Time: 13 seconds (ref 11.6–15.2)

## 2010-12-02 LAB — BASIC METABOLIC PANEL
GFR calc Af Amer: >60 mL/min (ref >60)
GFR calc non Af Amer: >60 mL/min (ref >60)
Potassium: 4.1 mEq/L (ref 3.5–5.1)
Sodium: 140 mEq/L (ref 135–145)

## 2010-12-02 LAB — CBC
HCT: 34.4 % — ABNORMAL LOW (ref 36.0–46.0)
Hemoglobin: 10.6 g/dL — ABNORMAL LOW (ref 12.0–15.0)
Platelets: 264 10*3/uL (ref 150–400)
RBC: 4.61 MIL/uL (ref 3.87–5.11)
WBC: 10.2 10*3/uL (ref 4.0–10.5)

## 2010-12-02 LAB — DIFFERENTIAL
Eosinophils Relative: 0 % (ref 0–5)
Lymphocytes Relative: 14 % (ref 12–46)
Lymphs Abs: 1.5 10*3/uL (ref 0.7–4.0)
Monocytes Absolute: 1.1 10*3/uL — ABNORMAL HIGH (ref 0.1–1.0)

## 2010-12-02 LAB — POCT CARDIAC MARKERS: Troponin i, poc: <0.05 ng/mL (ref 0.00–0.09)

## 2010-12-08 ENCOUNTER — Encounter (HOSPITAL_COMMUNITY): Payer: Self-pay

## 2010-12-08 ENCOUNTER — Emergency Department (HOSPITAL_COMMUNITY): Payer: BC Managed Care – PPO

## 2010-12-08 ENCOUNTER — Emergency Department (HOSPITAL_COMMUNITY)
Admission: EM | Admit: 2010-12-08 | Discharge: 2010-12-08 | Disposition: A | Discharge status: Home / Self Care | Payer: BC Managed Care – PPO | Attending: Emergency Medicine | Admitting: Emergency Medicine

## 2010-12-08 DIAGNOSIS — Z79899 Other long term (current) drug therapy: Secondary | ICD-10-CM | POA: Insufficient documentation

## 2010-12-08 DIAGNOSIS — I059 Rheumatic mitral valve disease, unspecified: Secondary | ICD-10-CM | POA: Insufficient documentation

## 2010-12-08 DIAGNOSIS — R109 Unspecified abdominal pain: Secondary | ICD-10-CM | POA: Insufficient documentation

## 2010-12-08 LAB — COMPREHENSIVE METABOLIC PANEL
ALT: 14 U/L (ref 0–35)
AST: 20 U/L (ref 0–37)
Alkaline Phosphatase: 60 U/L (ref 39–117)
CO2: 25 mEq/L (ref 19–32)
Chloride: 109 mEq/L (ref 96–112)
GFR calc Af Amer: >60 mL/min (ref >60)
GFR calc non Af Amer: >60 mL/min (ref >60)
Glucose, Bld: 98 mg/dL (ref 70–99)
Potassium: 3.6 mEq/L (ref 3.5–5.1)
Sodium: 138 mEq/L (ref 135–145)

## 2010-12-08 LAB — DIFFERENTIAL
Basophils Absolute: 0 10*3/uL (ref 0.0–0.1)
Eosinophils Relative: 2 % (ref 0–5)
Lymphs Abs: 1.6 10*3/uL (ref 0.7–4.0)
Monocytes Absolute: 0.7 10*3/uL (ref 0.1–1.0)
Neutro Abs: 4.7 10*3/uL (ref 1.7–7.7)

## 2010-12-08 LAB — URINALYSIS, ROUTINE W REFLEX MICROSCOPIC
Bilirubin Urine: NEGATIVE
Glucose, UA: NEGATIVE mg/dL
Hgb urine dipstick: NEGATIVE
Ketones, ur: NEGATIVE mg/dL
Protein, ur: NEGATIVE mg/dL
Urobilinogen, UA: 0.2 mg/dL (ref 0.0–1.0)

## 2010-12-08 LAB — CBC
HCT: 33.8 % — ABNORMAL LOW (ref 36.0–46.0)
Hemoglobin: 10.7 g/dL — ABNORMAL LOW (ref 12.0–15.0)
MCH: 22.5 pg — ABNORMAL LOW (ref 26.0–34.0)
MCHC: 31.7 g/dL (ref 30.0–36.0)
RDW: 16.3 % — ABNORMAL HIGH (ref 11.5–15.5)

## 2010-12-08 LAB — LIPASE, BLOOD: Lipase: 28 U/L (ref 11–59)

## 2010-12-08 MED ORDER — IOHEXOL 300 MG/ML  SOLN
100.0000 mL | Freq: Once | INTRAMUSCULAR | Status: AC | PRN
  Administered 2010-12-08: 100 mL via INTRAVENOUS

## 2010-12-24 LAB — URINALYSIS, ROUTINE W REFLEX MICROSCOPIC
Ketones, ur: NEGATIVE mg/dL
Nitrite: NEGATIVE
Protein, ur: NEGATIVE mg/dL
Urobilinogen, UA: 0.2 mg/dL (ref 0.0–1.0)

## 2010-12-24 LAB — COMPREHENSIVE METABOLIC PANEL
ALT: 24 U/L (ref 0–35)
Albumin: 3.1 g/dL — ABNORMAL LOW (ref 3.5–5.2)
Alkaline Phosphatase: 53 U/L (ref 39–117)
BUN: 8 mg/dL (ref 6–23)
Calcium: 8.7 mg/dL (ref 8.4–10.5)
Glucose, Bld: 101 mg/dL — ABNORMAL HIGH (ref 70–99)
Glucose, Bld: 93 mg/dL (ref 70–99)
Potassium: 3.6 mEq/L (ref 3.5–5.1)
Sodium: 138 mEq/L (ref 135–145)
Total Protein: 5.2 g/dL — ABNORMAL LOW (ref 6.0–8.3)
Total Protein: 6 g/dL (ref 6.0–8.3)

## 2010-12-24 LAB — URINE MICROSCOPIC-ADD ON

## 2010-12-24 LAB — GLUCOSE, CAPILLARY
Glucose-Capillary: 107 mg/dL — ABNORMAL HIGH (ref 70–99)
Glucose-Capillary: 114 mg/dL — ABNORMAL HIGH (ref 70–99)
Glucose-Capillary: 167 mg/dL — ABNORMAL HIGH (ref 70–99)
Glucose-Capillary: 91 mg/dL (ref 70–99)
Glucose-Capillary: 94 mg/dL (ref 70–99)

## 2010-12-24 LAB — DIFFERENTIAL
Basophils Relative: 0 % (ref 0–1)
Basophils Relative: 1 % (ref 0–1)
Lymphocytes Relative: 15 % (ref 12–46)
Lymphs Abs: 1.5 10*3/uL (ref 0.7–4.0)
Monocytes Relative: 12 % (ref 3–12)
Monocytes Relative: 13 % — ABNORMAL HIGH (ref 3–12)
Neutro Abs: 4.9 10*3/uL (ref 1.7–7.7)
Neutro Abs: 5.6 10*3/uL (ref 1.7–7.7)
Neutrophils Relative %: 66 % (ref 43–77)
Neutrophils Relative %: 71 % (ref 43–77)

## 2010-12-24 LAB — POCT I-STAT, CHEM 8
BUN: 5 mg/dL — ABNORMAL LOW (ref 6–23)
Calcium, Ion: 1.15 mmol/L (ref 1.12–1.32)
Chloride: 103 mEq/L (ref 96–112)
Chloride: 105 mEq/L (ref 96–112)
Creatinine, Ser: 1 mg/dL (ref 0.4–1.2)
Glucose, Bld: 203 mg/dL — ABNORMAL HIGH (ref 70–99)
HCT: 43 % (ref 36.0–46.0)
Hemoglobin: 14.6 g/dL (ref 12.0–15.0)
Potassium: 3.3 mEq/L — ABNORMAL LOW (ref 3.5–5.1)
Sodium: 140 mEq/L (ref 135–145)
TCO2: 21 mmol/L (ref 0–100)

## 2010-12-24 LAB — HEMOGLOBIN A1C: Hgb A1c MFr Bld: 5.8 % (ref 4.6–6.1)

## 2010-12-24 LAB — TROPONIN I
Troponin I: 0.02 ng/mL (ref 0.00–0.06)
Troponin I: <0.01 ng/mL (ref 0.00–0.06)

## 2010-12-24 LAB — CBC
HCT: 36.7 % (ref 36.0–46.0)
Hemoglobin: 11.1 g/dL — ABNORMAL LOW (ref 12.0–15.0)
Hemoglobin: 11.6 g/dL — ABNORMAL LOW (ref 12.0–15.0)
Hemoglobin: 12 g/dL (ref 12.0–15.0)
MCHC: 31.5 g/dL (ref 30.0–36.0)
MCHC: 31.5 g/dL (ref 30.0–36.0)
MCV: 76.6 fL — ABNORMAL LOW (ref 78.0–100.0)
RBC: 4.97 MIL/uL (ref 3.87–5.11)
RDW: 13.4 % (ref 11.5–15.5)
RDW: 13.8 % (ref 11.5–15.5)
WBC: 6.3 10*3/uL (ref 4.0–10.5)
WBC: 7.8 10*3/uL (ref 4.0–10.5)

## 2010-12-24 LAB — CK TOTAL AND CKMB (NOT AT ARMC)
CK, MB: 2.3 ng/mL (ref 0.3–4.0)
Relative Index: 1.8 (ref 0.0–2.5)

## 2010-12-24 LAB — LIPID PANEL
Cholesterol: 112 mg/dL (ref 0–200)
LDL Cholesterol: 57 mg/dL (ref 0–99)

## 2010-12-24 LAB — APTT: aPTT: 32 seconds (ref 24–37)

## 2010-12-24 LAB — CARBAMAZEPINE LEVEL, TOTAL: Carbamazepine Lvl: 1.4 ug/mL — ABNORMAL LOW (ref 4.0–12.0)

## 2010-12-24 LAB — RAPID URINE DRUG SCREEN, HOSP PERFORMED
Amphetamines: NOT DETECTED
Benzodiazepines: NOT DETECTED

## 2010-12-24 LAB — CARDIAC PANEL(CRET KIN+CKTOT+MB+TROPI)
Relative Index: 1.4 (ref 0.0–2.5)
Troponin I: 0.03 ng/mL (ref 0.00–0.06)

## 2010-12-24 LAB — URINE CULTURE: Colony Count: 6000

## 2010-12-24 LAB — LIPASE, BLOOD: Lipase: 26 U/L (ref 11–59)

## 2010-12-28 LAB — CBC
HCT: 36.4 % (ref 36.0–46.0)
MCV: 78.7 fL (ref 78.0–100.0)
Platelets: 218 10*3/uL (ref 150–400)
RBC: 4.62 MIL/uL (ref 3.87–5.11)
RDW: 15.2 % (ref 11.5–15.5)
WBC: 5.7 10*3/uL (ref 4.0–10.5)
WBC: 8.5 10*3/uL (ref 4.0–10.5)

## 2010-12-28 LAB — BASIC METABOLIC PANEL
BUN: 7 mg/dL (ref 6–23)
Calcium: 8.6 mg/dL (ref 8.4–10.5)
Creatinine, Ser: 0.92 mg/dL (ref 0.4–1.2)
GFR calc non Af Amer: >60 mL/min (ref >60)
Potassium: 3.6 mEq/L (ref 3.5–5.1)

## 2010-12-28 LAB — URINALYSIS, ROUTINE W REFLEX MICROSCOPIC
Glucose, UA: NEGATIVE mg/dL
Ketones, ur: NEGATIVE mg/dL
Leukocytes, UA: NEGATIVE
Specific Gravity, Urine: 1.009 (ref 1.005–1.030)
pH: 7.5 (ref 5.0–8.0)

## 2010-12-28 LAB — POCT I-STAT, CHEM 8
BUN: 6 mg/dL (ref 6–23)
Creatinine, Ser: 0.9 mg/dL (ref 0.4–1.2)
Glucose, Bld: 91 mg/dL (ref 70–99)
Hemoglobin: 13.6 g/dL (ref 12.0–15.0)
Potassium: 3.4 mEq/L — ABNORMAL LOW (ref 3.5–5.1)
TCO2: 25 mmol/L (ref 0–100)

## 2010-12-28 LAB — URINE MICROSCOPIC-ADD ON

## 2010-12-28 LAB — PREGNANCY, URINE: Preg Test, Ur: NEGATIVE

## 2010-12-28 LAB — DIFFERENTIAL
Basophils Absolute: 0 10*3/uL (ref 0.0–0.1)
Eosinophils Absolute: 0 10*3/uL (ref 0.0–0.7)
Eosinophils Relative: 0 % (ref 0–5)
Lymphocytes Relative: 17 % (ref 12–46)
Lymphocytes Relative: 18 % (ref 12–46)
Lymphs Abs: 1 10*3/uL (ref 0.7–4.0)
Lymphs Abs: 1.4 10*3/uL (ref 0.7–4.0)
Monocytes Relative: 8 % (ref 3–12)
Neutro Abs: 4 10*3/uL (ref 1.7–7.7)
Neutrophils Relative %: 70 % (ref 43–77)

## 2010-12-28 LAB — POCT CARDIAC MARKERS
CKMB, poc: <1 ng/mL — ABNORMAL LOW (ref 1.0–8.0)
CKMB, poc: <1 ng/mL — ABNORMAL LOW (ref 1.0–8.0)
Myoglobin, poc: 42.8 ng/mL (ref 12–200)
Myoglobin, poc: 46.8 ng/mL (ref 12–200)

## 2010-12-28 LAB — RAPID URINE DRUG SCREEN, HOSP PERFORMED
Barbiturates: NOT DETECTED
Benzodiazepines: POSITIVE — AB
Cocaine: NOT DETECTED

## 2010-12-28 LAB — ETHANOL: Alcohol, Ethyl (B): <5 mg/dL (ref 0–10)

## 2011-01-16 ENCOUNTER — Inpatient Hospital Stay (INDEPENDENT_AMBULATORY_CARE_PROVIDER_SITE_OTHER)
Admission: RE | Admit: 2011-01-16 | Discharge: 2011-01-16 | Disposition: A | Discharge status: Home / Self Care | Payer: BC Managed Care – PPO | Source: Ambulatory Visit | Attending: Family Medicine | Admitting: Family Medicine

## 2011-01-16 DIAGNOSIS — M545 Low back pain, unspecified: Secondary | ICD-10-CM

## 2011-01-26 NOTE — Discharge Summary (Signed)
NAMEMarland Kitchen  MATIA, ZELADA NO.:  [id number]   MEDICAL RECORD NO.:  [id number]          PATIENT TYPE:  INP   LOCATION:  1430                         FACILITY:  [facility]   PHYSICIAN:  Thora Lance, M.D.  DATE OF BIRTH:  27-Jul-1961   DATE OF ADMISSION:  11/21/2008  DATE OF DISCHARGE:                               DISCHARGE SUMMARY   REASON FOR ADMISSION:  A 50 year old white female with a past  significant history significant for anxiety disorder, palpitations, and  sleep apnea who presents after an episode of syncope.  She had some  crampy abdominal pain.  Gone to the bathroom.  Had been straining.  She  had then got up, and while walking into her kitchen had a brief syncopal  episode and hit the front of her face.  There was no witnessed seizure  activity.  She was alert and oriented right after the fall.   SIGNIFICANT FINDINGS:  VITAL SIGNS:  Stable and normal.  LUNGS:  Clear.  HEART:  Regular rate and rhythm without murmur, gallop or rub.  ABDOMEN:  Soft, nontender, nondistended.  EXTREMITIES:  No edema.  NEUROLOGIC:  Nonfocal.   LABORATORIES:  WBC 7.8, hemoglobin 14.6.  Sodium 145, potassium 3.3,  creatinine 1.0.  Urinalysis:  11-20 wbc's and many bacteria.  Chest x-  ray showed no acute cardiopulmonary disease.  CT scan of the head:  No  acute findings.  CT angiogram of the chest was done and was negative for  pulmonary embolus.   HOSPITAL COURSE:  Syncope.  The patient was admitted to telemetry.  She  spent 24 hours in telemetry, and had no significant arrhythmias noted,  all normal sinus rhythm.  Her EKG showed normal sinus rhythm and normal  EKG.  She had no further symptoms.  The episode was felt to be  vasovagal.  The patient was counseled on how to minimize the possibility  of this occurring in the future.  It was suggested she lower her  Vistaril to 25 mg a day.  She had a 2-D echocardiogram pending.   DISCHARGE DIAGNOSIS:  Syncope.   PROCEDURES:  1. A CT scan of the head.  2. CT angiogram of the chest.  3. 2-D echocardiogram.   DISCHARGE MEDICATIONS:  1. Paxil 12.5 mg daily.  2. Aspirin 81 mg daily.  3. Atenolol 25 mg twice a day.  4. Cardizem 30 mg a day.  5. Tegretol 150 mg a day.  6. Vistaril 25 mg a day.   DISPOSITION:  Discharged to home.   FOLLOWUP:  Two weeks Dr. Valentina Lucks.   ACTIVITY:  As tolerated.           ______________________________  Thora Lance, M.D.     JJG/MEDQ  D:  11/22/2008  T:  11/22/2008  Job:  [id number]

## 2011-01-26 NOTE — Assessment & Plan Note (Signed)
[facility]                         ELECTROPHYSIOLOGY OFFICE NOTE   ZIYAH, CORDOBA                     MRN:          [id number]  DATE:11/09/2007                            DOB:          Feb 11, 1961    REFERRING PHYSICIAN:  Lyn Records, M.D.   Ms. Arca is referred today by Dr. Garnette Scheuermann for recommendations and  evaluation and treatment of palpitations without any documented SVT or  VT.  The patient is very pleasant 50 year old woman with a longstanding  history of tachy palpitations dating back approximately 12 years.  She  has noted that she had had one episode of sustained palpitations and  SVT, taking her the emergency department which occurred approximately 12  years ago.  Now when she has an episode of palpitations, she will take a  Xanax and relax and typically her symptoms improve.  She notes no rhyme  or reason as to when she experiences the palpitations.  She is worn  several monitors under the direction of Dr. Katrinka Blazing without any  significant documented arrhythmias.  She denies frank syncope.  She does  have some chest discomfort when she has her palpitations.  Otherwise,  she is asymptomatic.  She denies shortness of breath.  She denies  peripheral edema.   PAST MEDICAL HISTORY:  Is really fairly unremarkable.  She has  borderline hypertension.  She has questionable anxiety disorder.  She  has had stress testing in the past which demonstrated no ischemia.   FAMILY HISTORY:  Is negative for coronary artery disease or documented  SVT.   SOCIAL HISTORY:  The patient denies tobacco or ethanol abuse.  She is  divorced.   REVIEW OF SYSTEMS:  Her review of systems is otherwise negative except  as noted in the HPI.   PHYSICAL EXAMINATION:  On physical exam she is a pleasant well-appearing  middle-aged woman in no acute distress.  The blood pressure is 107/68,  the pulse was 70 and regular, respirations were 18, the weight was 191  pounds.  HEENT:  Normocephalic, atraumatic.  Pupils equal and round.  Oropharynx  moist.  Sclerae anicteric.  NECK:  Revealed no jugular venous distention.  There is no thyromegaly.  Trachea is midline.  Carotids are 2+ and symmetric.  LUNGS:  Clear bilaterally to auscultation.  No wheezes, rales or rhonchi  are present. There is no increased work of breathing.  CARDIOVASCULAR:  Exam regular rate and rhythm.  Normal S1-S2 and no  murmurs, rubs or gallops.  PMI was not enlarged or laterally displaced.  ABDOMINAL:  Soft, nontender, nondistended.  There is no organomegaly.  Bowel sounds are present.  There is no rebound or guarding.  EXTREMITIES:  Demonstrate no cyanosis, clubbing or edema.  Pulses were  2+ and symmetric.  NEUROLOGIC:  Alert and oriented x3, cranial nerves intact.  The strength  is 5/5 and symmetric.   EKG demonstrates sinus rhythm with normal axis and intervals.   IMPRESSION:  1. Symptomatic palpitations.  2. Borderline hypertension.  3. Anxiety problems.   DISCUSSION:  Ms. Rudy palpitations are bothersome to her.  This is  despite beta blocker therapy.  I do not note any significant sustained  arrhythmias.  I have recommended that we try switching her from a beta  blocker, to a calcium channel blocker and see if her symptoms of  palpitations improve.  I will see her back in approximately a month to  see how she is doing with this.     Doylene Canning. Ladona Ridgel, MD  Electronically Signed    GWT/MedQ  DD: 11/09/2007  DT: 11/10/2007  Job #: [id number]   cc:   Lyn Records, M.D.

## 2011-01-26 NOTE — Assessment & Plan Note (Signed)
[facility]                         ELECTROPHYSIOLOGY OFFICE NOTE   LILLE, KARIM                     MRN:          [id number]  DATE:05/24/2008                            DOB:          August 17, 1961    [name] returns today for followup.  She is a very pleasant middle-  aged woman with a history of tachy palpitations, as well as documented  chest pressure.  The patient has not ever had any significantly  documented SVT or VT.  Because of her palpitations, she has been on  multiple medications and currently is on metoprolol 50 twice a day.  She  relates an instant that occurred several weeks ago, when she was over in  [facility] and developed recurrent chest pain and palpitations.  On  presentation to the emergency room, she had an abnormal EKG.  She was  ultimately discharged from the hospital.  She continues to have some  intermittent chest discomfort.  There is no clear-cut relationship to  exertion with this.  There may be an emotional component.  Interestingly  enough, her EKG in [facility] demonstrated ST-segment depression,  though no ST elevation, which was significantly changed from prior ECGs.   PHYSICAL EXAMINATION:  GENERAL:  Today is notable for being a pleasant  well-appearing middle-aged woman in no distress.  VITAL SIGNS:  Blood pressure was 114/80.  The pulse was 64 and regular.  The respirations were 18.  Weight was 188 pounds. NECK:  No jugular  distention.  LUNGS:  Clear bilaterally to auscultation.  No wheezes, rales, or  rhonchi are present.  CARDIOVASCULAR:  Regular rate and rhythm.  Normal S1 and S2.  EXTREMITIES:  No cyanosis, clubbing, or edema.   IMPRESSION:  1. History of tachy palpitations.  2. Chest pain with an abnormal EKG, but a negative cath 2 years ago.   DISCUSSION:  The abnormal EKG with ST-segment changes in the absence of  known coronary artery disease suggest that the patient may well have  Prinzmetal's angina.  Because, she is on beta-blockers which could  exacerbate this, I have recommended that we switch her from a beta-  blocker to calcium channel blockers which might help with palpitations  as well.  I have recommended that she started on Cardizem and we have  given her prescription today to initially start on 120 mg a day with  plans to uptitrate to 180 mg a day after following up with Dr. Katrinka Blazing.  I  will see the patient back on a p.r.n. basis.      Doylene Canning. Ladona Ridgel, MD  Electronically Signed    GWT/MedQ  DD: 05/24/2008  DT: 05/25/2008  Job #: [id number]   cc:   Lyn Records, M.D.

## 2011-01-26 NOTE — Procedures (Signed)
NAME:  [name], [name] NO.:  [id number]   MEDICAL RECORD NO.:  [id number]          PATIENT TYPE:  OUT   LOCATION:  SLEEP CENTER                 FACILITY:  [facility]   PHYSICIAN:  Clinton D. Maple Hudson, MD, FCCP, FACPDATE OF BIRTH:  01-Feb-1959   DATE OF STUDY:  01/23/2008                            NOCTURNAL POLYSOMNOGRAM   REFERRING PHYSICIAN:  Clinton D. Young, MD, FCCP, FACP   INDICATIONS FOR PROCEDURE:  Hypersomnia with sleep apnea.   RESULTS:  Epward sleepiness score 15/24, BMI 37, weight 194 pounds,  height 66 inches, neck 13 inches.   HOME MEDICATIONS:  Charted and reviewed.   A diagnostic study on Novmber 27, 2006 had recorded an AHI of 18 per  hour. CPAP titration on July 18, 2007 set pressure at 15 CWP with AHI  zero. Most recently, she had been using a VP AP with pressure of 5/3,  pressure support of 15 and rate of 18.   SLEEP ARCHITECTURE:  Total sleep time 305.5 minutes with sleep  efficiency 72.7%. Stage 1 was 2.8%; Stage 2, 76.1%; Stage 3 absent REM  21.1% of total sleep time. Sleep latency 53.5 minutes, REM latency 71  minutes. Awake after sleep onset 61 minutes. Arousal index 4.3.  Metoprolol and Xanax were taken at 2:10 a.m.   RESPIRATORY DATA:  Initial recording was begun with inspiratory pressure  of 5, expiratory pressure of 3, recording an AHI of 4.7 per hour for 142  minutes. Pressure w2as then recorded inspiratory 6 and expiratory 3, for  86 minutes with an AHI of zero per hour. She was then converted to CPAP  with an AHI of zero at 5 and 6 CWP. Technician felt optimal pressure was  VP AP with an expiratory pressure of 6. Minimal pressure of 3, maximal  pressure of 15, respirations at 18, wore equally well with a CPAP of 6  CWP. She tolerated both settings well. She used a small Mirage Quattro  mask with heated humidifier.   OXYGEN DATA:  Snoring was prevented by all PAP pressures with mean  oxygen saturation through the study of 97.2% on  room air.   CARDIAC DATA:  Normal sinus rhythm.   MOVEMENT/PARASOMNIA:  No significant movement disturbance. Bathroom x1.   IMPRESSION/RECOMMENDATIONS:  1. Equally good control, AHI zero, with VP AP set at [facility] equals 6,      minimal PF's equal 3, maximum PF's equal 15, respiratory rate 18,      or with CPAP at 6 CWP. Both were tolerated well. She used a small      Mirage Quattro mask with heated humidifier.  2. Previous studies included August 09, 2005 when AHI was 18 per      hour at baseline. July 18, 2007, CPAP was titrated to 15 CWP,      AHI zero.      Clinton D. Maple Hudson, MD, [facility], FACP  Diplomate, Biomedical engineer of Sleep Medicine  Electronically Signed     CDY/MEDQ  D:  02/03/2008 20:19:04  T:  02/03/2008 23:32:30  Job:  [id number]

## 2011-01-26 NOTE — H&P (Signed)
[name], [name] NO.:  [id number]   MEDICAL RECORD NO.:  [id number]          PATIENT TYPE:  INP   LOCATION:  0103                         FACILITY:  [facility]   PHYSICIAN:  Michiel Cowboy, MDDATE OF BIRTH:  1961-09-12   DATE OF ADMISSION:  11/21/2008  DATE OF DISCHARGE:                              HISTORY & PHYSICAL   PRIMARY CARE Sheikh Leverich:  Dr. Kirby Funk.   CHIEF COMPLAINT:  Syncope.   The patient is a 50 year old female with past medical history  significant for an anxiety disorder, palpitations and sleep apnea.  The  patient was at her baseline of health up until she woke up tonight with  crampy-like sensation in her abdomen, feeling like she needs to go  defecate.  She went to the bathroom.  She was straining quite a lot and  then once she got up, felt a little funny.  She started to get a  little old diaphoretic.  No chest pain, no shortness of breath, although  feeling presyncopal.  She walked over to the kitchen to get a glass of  water and on the way there collapsed.  Her friend was in the house and  got there quickly.  She recovered rapidly after her fall.  She did not  have any witnessed seizure-like activity.  She was alert and oriented  right after the fall, though she did hit the front of her face.  She  denies ever having syncope episodes before this.   The patient thought that she probably had an episode of hypoglycemia.  She is not diabetic, but has frequent episodes of hypoglycemia, self-  reported.   REVIEW OF SYSTEMS:  No fevers, no chills.  No nausea, no vomiting.  Occasional constipation.  She has a history of recently thrombosed  hemorrhoid.  Otherwise review of systems unremarkable.  The patient  denies any lower extremity swelling and is very active.   PAST MEDICAL HISTORY:  1. Anxiety.  2. Mitral valve prolapse.  3. Palpitations.  4. Sleep apnea.  5. Heart murmur.   SOCIAL HISTORY:  The patient does not smoke or  drink, does not use  drugs.  In the past history, she had an addiction to Xanax, but has  undergone detoxification from that and currently states she is Xanax  free.   FAMILY HISTORY:  Significant for grandfather and grandmother with  coronary artery disease and diabetes, but nobody with early MI.   ALLERGIES:  1. DOXYCYCLINE.  2. ROCEPHIN.   MEDICATIONS:  1. Paxil 12.5 mg daily.  2. Atenolol 25 twice a day.  3. Cardizem 30 mg once a day.  4. Tegretol 150 once a day.   PHYSICAL EXAMINATION:  GENERAL:  The patient appears to be in no acute  distress.  HEENT:  Head nontraumatic.  Moist mucous membranes.  LUNGS:  Clear to auscultation bilaterally.  HEART:  Regular rate and rhythm.  No murmurs, rubs or gallops.  SKIN:  Clean, dry and intact.  There is a small laceration to the mouth.  ABDOMEN:  Soft, nontender, nondistended.  LOWER EXTREMITIES:  Without clubbing, cyanosis  or edema.  NEUROLOGICAL:  Intact.  Strength 5/5 in all four extremities.  Cranial  nerves intact.   LABORATORY DATA:  White blood cell count 7.8, hemoglobin 14.6.  Sodium  145, potassium 3.3, creatinine 1.0, D-dimer elevated at 0.65.  UA  positive for 11-20 white blood cells and many bacteria.  Chest x-ray  showing no acute cardiopulmonary disease.  CT scan of the head showing  no acute findings, but there is an incidental finding of cavum septum  pellucidum and septum vergae.  EKG showing T-wave inversions in lead 3-  4.  Of note, the patient had a negative cardiac catheterization in  September 2009.   CT angiogram of the chest is currently pending.   ASSESSMENT/PLAN:  This is a 50 year old female who had syncope after  straining in the bathroom.   1. Syncope, possibly vasovagal related to straining, but will check      orthostatics.  Check 2-D echo.  To be on the safe side, we will      cycle cardiac markers, but this is coronary artery disease related.      She had a clear cath 6 months ago.  We will  check her blood sugars      x12 hours.  Check hemoglobin A1c, check fasting lipid panel and      TSH.  2. History of palpitations, currently denies but will continue      Cardizem and atenolol with holding parameters to avoid      hypertension.  3. History of anxiety disorder and depression.  Continue Paxil and      Tegretol.  Check Trileptal level.  Check urine toxicity screen.  4. Prophylaxis.  Protonix plus Lovenox.    Dr. Kirby Funk is going to assume care in a.m.      Michiel Cowboy, MD  Electronically Signed     AVD/MEDQ  D:  11/21/2008  T:  11/21/2008  Job:  [id number]   cc:   Thora Lance, M.D.  Fax: [phone]

## 2011-01-26 NOTE — Procedures (Signed)
NAME:  [name], [name] NO.:  [id number]   MEDICAL RECORD NO.:  [id number]          PATIENT TYPE:  OUT   LOCATION:  SLEEP CENTER                 [facility]ACILITY[facility]   PHYSICIAN:  Clinton D. Maple Hudson, MD, FCCP, FACPDATE OF BIRTH:  Jul 05, 1961   DATE OF STUDY:  07/18/2007                            NOCTURNAL POLYSOMNOGRAM   REFERRING PHYSICIAN:  Candyce Churn, M.D.   INDICATION FOR STUDY:  Hypersomnia with sleep apnea.   EPWORTH SLEEPINESS SCORE:  11/24, BMI 31.6, weight 196 pounds, height 66  inches.   HOME MEDICATIONS:  Listed and reviewed.   A baseline diagnostic study on August 09, 2005, recorded an AHI of 18  per hour.  CPAP titration is now requested.   SLEEP ARCHITECTURE:  Total sleep time 268 minutes with sleep efficiency  66%.  Stage I was 2%, stage II 76%, stage III absent, REM 21% of total  sleep time.  Sleep latency 84 minutes.  REM latency 64 minutes.  Awake  after sleep onset 8 minutes.  Arousal index 0.9.  Metoprolol and Toprol  were both reported as being taken at bedtime, along with Xanax.  The  patient asked to delay lights out and started recording in order to  watch election returns with sleep onset at 12:30 a.m.   RESPIRATORY DATA:  CPAP titration protocol.  CPAP was titrated to 15-  CWP, AHI zero per hour.  A medium ResMed Quattro mask was used with  heated humidifier.   OXYGEN DATA:  No snoring noted on CPAP with mean oxygen saturation 97.5%  on room air.   CARDIAC DATA:  Normal sinus rhythm.   MOVEMENT-PARASOMNIA:  Occasional limb jerk with insignificant effect on  sleep.   IMPRESSIONS-RECOMMENDATIONS:  1. Successful CPAP titration to 15-CWP, apnea/hypopnea index zero per      hour.  A medium ResMed Quattro mask was used with heated      humidifier.  2. Baseline diagnostic nocturnal polysomnogram study on August 09, 2005, had recorded an apnea/hypopnea index of 18 per hour.  3. Note that the patient indicated to  technician that metoprolol and      Toprol were taken at 10:52 p.m. along with Xanax, suggest      clarification to make sure the patient is not mistakenly      duplicating medication.      Clinton D. Maple Hudson, MD, [facility], FACP  Diplomate, Biomedical engineer of Sleep Medicine  Electronically Signed     CDY/MEDQ  D:  07/23/2007 11:42:07  T:  07/23/2007 20:05:34  Job:  [id number]

## 2011-01-26 NOTE — Assessment & Plan Note (Signed)
[facility]                         ELECTROPHYSIOLOGY OFFICE NOTE   TALER, KUSHNER                     MRN:          [id number]  DATE:04/29/2008                            DOB:          [birth year]    Ms. Ghazarian returns today for followup.  She is a very pleasant middle-  aged woman with tachy palpitations and recent 3-week monitor, which  demonstrated 1episode, was probably atrial tachycardia at about 140  beats per minute.  She returns today for followup.  She has been under  increasing stress as she has lost her job and no longer has insurance.  She denies chest pain.  She denies shortness of breath except when she  has palpitations, which are infrequent.   Medicines include metoprolol 50 twice a day.   PHYSICAL EXAMINATION:  GENERAL:  She is a pleasant well-appearing middle-  aged woman in no distress.  VITAL SIGNS:  Blood pressure was 106/62, the pulse was 80 and regular,  the respirations were 18, the weight was 191 pounds.  NECK:  Revealed no jugular venous distention.  LUNGS:  Clear bilaterally with auscultation.  No wheezes, rales, or  rhonchi are present.  CARDIOVASCULAR:  Regular rate and rhythm with normal S1 and S2.  EXTREMITIES:  Demonstrate no cyanosis, clubbing, or edema.  The pulses  were 2+ symmetric.   IMPRESSION:  1. Palpitations.  2. Probable atrial tachycardia with fairly infrequent symptoms and      fairly well-controlled, but angst causing on beta-blockers.   DISCUSSION:  I have recommended the continuation of Ms. Comas's beta-  blockers and cautioned her on the benign nature of her symptoms.  Obviously, if her palpitations increased in frequency and severity, then  consideration of catheter ablation would be an option, though I told her  at the present time, I think the risk/benefit reward does not recommend  ablation therapy.  We will see her back in several months.      Doylene Canning. Ladona Ridgel, MD  Electronically  Signed    GWT/MedQ  DD: 04/29/2008  DT: 04/30/2008  Job #: [id number]   cc:   Lyn Records, M.D.

## 2011-01-26 NOTE — Cardiovascular Report (Signed)
NAMEMarland Kitchen  CHAVON, LUCARELLI NO.:  [id number]   MEDICAL RECORD NO.:  [id number]          PATIENT TYPE:  INP   LOCATION:  2807                         FACILITY:  [facility]   PHYSICIAN:  Lyn Records, M.D.   DATE OF BIRTH:  [birth year]   DATE OF PROCEDURE:  06/07/2008  DATE OF DISCHARGE:                            CARDIAC CATHETERIZATION   INDICATIONS:  [name] is a 50 year old who has had recurrent somewhat  atypical symptoms.  She recently underwent a stress Cardiolite study  that raised the possibility of mid to distal anterior wall ischemia  versus soft tissue artifact.  She awakened this morning with left arm  pain and had been having recurring episodes of left jaw pain and chest  pain.  This caused her to go to the [facility] emergency room.  She was  seen by Dr. Patria Mane.  He felt that she had EKGs about a week ago that  demonstrated ischemia, and even though her markers and recent nuclear  studies were not nondiagnostic/not definitive, she should be admitted to  the hospital for further evaluation.  Coronary angiography has been  schedule as a definitive means of determining if there is coronary  obstructive disease.   PROCEDURE PERFORMED:  1. Left heart catheterization.  2. Selective coronary angiogram.  3. Left ventriculography.  4. Hemostasis achieved with manual compression.   DESCRIPTION:  The patient was brought to the catheterization lab from  the [facility] Emergency Room where she had been transferred from the  [facility] ER.  She was seen on the catheterization table where history  was obtained and physical examination performed by P.A., Guy Franco, of  [facility].  Risks of the procedure were discussed with the  patient in detail.  She wanted to proceed.   A 6-French sheath was placed in the right femoral artery using the  modified Seldinger technique.  A 6-French A2 multipurpose catheter was  then used for hemodynamic recordings, and left  ventriculography by hand  injection.  Left ventricular contractility was hyperdynamic.  Heart  despite 2 mg of Versed and 50 mcg of fentanyl was above 100 beats per  minute.  The patient preferred not to be put completely asleep.  Left  ventricular contractility was vigorous.   We then switched to preformed Judkins catheters for selective left and  right coronary angiography.  A #4 6-French left and right catheters were  used.   No complications occurred.   The procedure was terminated and hemostasis achieved with manual  compression.   RESULTS:  1. Hemodynamic data:      a.     Aortic pressure 138/86.      b.     Left ventricular pressure 143/17.  2. Left ventriculography:  Left ventricular cavity size is normal.      Hypercontractility is noted.  Ejection fraction 80%.  No      intracavitary gradient is noted  3. Coronary angiography.      a.     Left main coronary:  Widely patent.      b.     Left anterior  descending coronary:  The left anterior       descending coronary artery is large.  It gives origin to a large       branching first diagonal.  The vessel is normal to the apex.  At       the apex, it bifurcates into 2 smaller branches.  No significant       obstruction or any significant regions of plaquing are noted.      c.     Ramus intermedius branch:  The ramus intermedius branch is a       large vessel that supplies the anterolateral wall and is entirely       normal.      d.     Circumflex artery:  Widely patent giving origin to 3 obtuse       marginal branches.      e.     Right coronary:  The right coronary artery is widely patent,       gives the PDA and AV-nodal branch.  Large acute marginal branch       arises from this vessel as well.   CONCLUSION:  1. Hyperdynamic left ventricular contractility without intracavitary      gradient.  No mitral regurgitation is noted.  EF greater than 80%.  2. Normal coronary arteries.  3. Chest pain syndrome and  palpitations likely related to      anxiety/neurosis.   PLAN:  Beta blockade.  Withdrawal baseline serum and urine  catecholamines.  Will check a 5HIAA level.  Will check full thyroid  function studies.      Lyn Records, M.D.  Electronically Signed     HWS/MEDQ  D:  06/07/2008  T:  06/07/2008  Job:  [id number]

## 2011-01-26 NOTE — H&P (Signed)
NAMEMarland Hanson  ASUKA, DUSSEAU NO.:  [id number]   MEDICAL RECORD NO.:  [id number]          PATIENT TYPE:  IPS   LOCATION:  0502                          FACILITY:  [facility]   PHYSICIAN:  Geoffery Lyons, M.D.      DATE OF BIRTH:  1960/11/14   DATE OF ADMISSION:  09/18/2008  DATE OF DISCHARGE:                       PSYCHIATRIC ADMISSION ASSESSMENT   IDENTIFICATION:  50 year old female.  This is a voluntary admission.   HISTORY OF PRESENT ILLNESS:  First [name] admission for this 50 year old  who is requesting help detoxing off alprazolam.  She reports she started  on taking alprazolam at 0.5 mg extended release about 4 years ago and  then gradually through the years has gone up on the dose.  She said that  aggravating factors included losing her job in June 2009 which also  increased her anxiety.  Also, through the years she has developed  palpitations for which she is followed by cardiology.  The palpitations  had caused her anxiety to increase and increasing her level of worry.  She denies any suicidal thoughts.  She reports now her dose of Xanax has  gradually been increased through the years to the point that she is  taking about 3.5 mg per day and she would like to be off of it.  She  denies any other substance abuse.  She has had episodes of feeling  anxious, increased rapid heart rate with some palpitations when she  attempts to cut back on the Xanax which in turn increases her anxiety  and fears and concerns about her heart condition.   PAST PSYCHIATRIC HISTORY:  Previously followed by Dr. Phillip Heal, MD  now followed by Saul Fordyce nurse practitioner as an outpatient.  She  also sees Joni her counselor at [facility]  outpatient clinic.  She was started [employer] by Dr. Alanson Aly, M.D.  several years ago for possible bipolar disorder but she denies having  any history of acute manic episodes.  She has taken Celexa in the past  but developed itching  on this medication.  Denies a history of suicide  attempts or suicidal thoughts.   SOCIAL HISTORY:  Previously worked in healthy care.  Now has not been  working since June 2009.  Endorsing some financial stressors.  No legal  problems.  Stable home situation.   MEDICAL HISTORY:  Dr. Denton Lank is her primary care physician and she is  followed by Dr. Katrinka Blazing at the [facility] cardiology.  Medical problems include  obstructive sleep apnea, uses CPAP and she has past medical history of  tachycardia with palpitations.  The past medical history positive for  benign thyroid nodules and mitral valve prolapse and palpitations NOS.   CURRENT MEDICATIONS:  1. Alprazolam.  Taking about 2.5 mg XR in the morning and      supplementing with 2.5 mg immediate release throughout the day.  2. Atenolol 25 mg q. 8 a.m. and 12:00 noon.  3. Cardizem 30 mg daily.  4. Aspirin 81 mg daily.  5. Paxil 12.5 mg daily.   PHYSICAL EXAMINATION:  Physical exam was done  in the emergency room as  noted in the record.  This is a healthy female in no distress with no  visible acute symptoms of withdrawal at this time.  She has complained  of some tachycardia this morning which was relieved by her dose of  Librium.   VITAL SIGNS:  On admission temperature 97.6, pulse 107, respirations 20,  blood pressure 149/93.  This morning after partial Librium protocol  pulse was decreased to 72 and blood pressure 119/80.  She is 5 feet 6  inches tall, 82 kg.   DIAGNOSTIC STUDIES:  Tegretol level 5.8.  Urine drug screen positive for  benzodiazepines.  Basic metabolic panel is well within normal limits.  CBC is essentially normal and alcohol level is less than 5.   MENTAL STATUS EXAM:  Fully alert female, cooperative, pleasant bright  affect, appropriate, fully engaged in conversation.  Speech normal.  Gives a coherent history, normal production, pace and form.  Mood is  anxious.  Thought process logical, coherent, goal directed.   Her  questions are appropriate.  Gives a reasonable description of her  symptoms.  No suicidal thoughts.  No dangerous thoughts.  No evidence of  psychosis.  No signs of confusion or delirium.  Cognition fully oriented  x4 in full contact with reality.  Immediate, recent, remote memory is  intact.  Impulse control and judgment within normal limits.  Insight is  good.   AXIS I:  Benzodiazepine dependence.  AXIS II:  Deferred.  AXIS III:  MVP obstructive sleep apnea, tachycardia NOS.  AXIS IV:  Deferred.  AXIS V:  Current 48, past year not known.   PLAN:  Voluntarily admit her with a goal of a safe detox from the  alprazolam.  We started her on a Librium protocol.  She will get four  doses of 25 mg today and reports that she feels she is tolerating those  well.  We will look at customizing the protocol as needed as she makes  progress here and she plans to return to current providers for follow-  up.      Margaret A. Scott, N.P.      Geoffery Lyons, M.D.  Electronically Signed    MAS/MEDQ  D:  09/19/2008  T:  09/19/2008  Job:  [id number]

## 2011-01-26 NOTE — Assessment & Plan Note (Signed)
[facility]                            CARDIOLOGY OFFICE NOTE   [name], [name]                     MRN:          [id number]  DATE:04/10/2008                            DOB:          27-Aug-1961    This is a 50 year old African American female patient of Dr. Verdis Prime  and Dr. Lewayne Bunting who has a history of palpitations without any  documented SVT or VT.  Dr. Ladona Ridgel saw her back in February 2009 and  tried switching her from beta blocker to a calcium channel blocker to  see if this would improve her palpitations.  She said one day off the  beta blocker she could not tolerate it and went back on her beta  blocker.  She has been under increased stress with loss of a job and has  gone to the emergency room on 3 different occasions since March 2009,  most recently being 2 days ago.  She has had some trouble with anxiety  and depression and has been placed on multiple medications including  Celexa and Cymbalta.  She said the Celexa made it feel like her heart  was pounding out of her chest and the Cymbalta caused her heart to race.  All labs were within normal limits when she was in the emergency room 2  days ago, but back in March 2009 her potassium had been low.  She is  followed by, I believe Dr. Lucianne Muss, for a thyroid nodule and just had her  thyroid checked, which has been stable per the patient.  She denies any  dizziness or presyncope, chest pain, or dyspnea.  She says she just  feels her heart racing, and she becomes extremely anxious and feels like  she is having a panic attack.  She is afraid to exercise because she  feels like she gets her heart rate up too high and it takes too long to  come down.   CURRENT MEDICATIONS:  1. Metoprolol 50 mg b.i.d.  2. Equetro 300 mg daily.   PHYSICAL EXAMINATION:  GENERAL:  This is a very pleasant 50 year old  African American female patient in no acute distress.  VITAL SIGNS:  Blood pressure  113/71, pulse 75, and weight 190.  NECK:  Without JVD, HJR, bruit, or thyroid enlargement.  LUNGS:  Clear anterior, posterior, and lateral.  HEART:  Regular rate and rhythm at 75 beats per minute.  Normal S1 and  S2.  No murmur, rub, bruit, thrill, or heave noted.  ABDOMEN:  Soft without organomegaly, masses, lesions, or abnormal  tenderness.  EXTREMITIES:  Without cyanosis, clubbing, or edema.  She has good distal  pulses.   EKG from the ER, normal sinus rhythm, no acute change from prior  tracings.   IMPRESSION:  1. Recurrent palpitations associated with anxiety.  2. History of palpitations without documented supraventricular      tachycardia or ventricular tachycardia.  3. Anxiety and depression.  4. Borderline hypertension.   PLAN:  I have asked the patient to increase her metoprolol to 50 mg  t.i.d.  I told her she could take an  additional metoprolol if she did  develop palpitations and asked her to see Dr. Ladona Ridgel back in 1 month for  followup.      Jacolyn Reedy, PA-C  Electronically Signed      Madolyn Frieze. Jens Som, MD, [facility]  Electronically Signed   ML/MedQ  DD: 04/10/2008  DT: 04/11/2008  Job #: [id number]

## 2011-01-26 NOTE — Letter (Signed)
November 09, 2007    Lyn Records, M.D.  [address]  [address]  [place] [address]   RE:  Danielle, Hanson  MRN:  [id number]  /  DOB:  October 23, 1960   Dear Erskine Emery:   Thank you for referring [name] for EP evaluation.  As you  know she is a very pleasant middle-aged woman with a history of tachy  palpitations and no significant documented SVT although she does note  she had one episode of documented SVT back in 1996 unfortunately for  which we do not have medical record.  The patient has a long history of  tachy palpitations.  She states that her heart races from time to time  but typically is not prolonged.  The patient does feel fatigue and  weakness and notes that when her heart becomes irregular she takes a  Xanax and relaxes and notes that typically things are better.  Otherwise  she had no specific complaints today.  Her EKG demonstrates sinus rhythm  with normal axis and intervals.  There is no pre-excitation.  The rest  of her physical exam has been well documented by you in the past.   [name] seems to be symptomatic still from her palpitations and I  suspect she may have some SVT although mostly it seems like her symptoms  are due to PACs and PVCs.  Because she is continuing to have  breakthroughs on beta-blockers I have asked that she try discontinuing  her beta-blocker and try verapamil long-acting initially 120 a day then  increasing the dose to 240 a day.  I will plan to see her back in about  1 month to see how she is doing.  At  this point I think medical therapy alone makes sense in that unless she  experiences much more documented SVT that catheter ablation would not be  warranted.   Thanks again for referring [name] for EP evaluation.    Sincerely,      Doylene Canning. Danielle Ridgel, MD  Electronically Signed    GWT/MedQ  DD: 11/09/2007  DT: 11/10/2007  Job #: [id number]

## 2011-01-26 NOTE — H&P (Signed)
NAMEMarland Hanson  LUIS, Danielle NO.:  [id number]   MEDICAL RECORD NO.:  [id number]          PATIENT TYPE:  INP   LOCATION:  2014                         FACILITY:  [facility]   PHYSICIAN:  Lyn Records, M.D.   DATE OF BIRTH:  [birth year]   DATE OF ADMISSION:  06/07/2008  DATE OF DISCHARGE:                              HISTORY & PHYSICAL   CHIEF COMPLAINT:  Left arm pain.   HISTORY OF PRESENT ILLNESS:  [name] is a 50 year old female who over  the past several weeks has had chest pain on and off with jaw pain.  She  sought medical attention from Dr. Katrinka Blazing and a Cardiolite was performed  which showed ischemia in the anterior region.  Over the 24 hours, she  has had intermittent chest and jaw pain and this morning, she awoke with  left arm pain.  She went to [facility] Emergency Room.  She was  provided with nitroglycerin paste and this relieved pain.  She is  currently pain-free.  She has been transferred over to [facility] for  cardiac catheterization.   PAST MEDICAL HISTORY:  History of anxiety.  She has a history of  palpitations and even seen Dr. Sharrell Ku in the past.  She has been on  Lopressor in the past but has not required it any longer.   SOCIAL HISTORY:  Lives in [place].  Works at [employer].  Denies  any tobacco, alcohol, or illicit drug use.   FAMILY HISTORY:  Mom had palpitations.  Father with no significant past  medical history.   ALLERGIES:  ROCEPHIN and DOXYCYCLINE.   MEDICATIONS:  1. Xanax 1 mg q.a.m.  2. Prilosec 20 mg a day.  3. Tegretol 200 mg a day for anxiety.  4. Vitamin B daily.   She is currently on 1-inch topical nitrate paste.   REVIEW OF SYSTEMS:  As above, otherwise negative.   PHYSICAL EXAMINATION:  VITAL SIGNS:  Temperature 97.9, pulse 83,  respirations 18, blood pressure 106/56, and O2 saturations 99% on room  air.  GENERAL:  She is in no acute distress, although she is somewhat anxious.  HEENT:  Grossly normal.   Sclerae clear.  Conjunctivae normal.  Nares  without drainage.  NECK:  No carotid or subclavian bruits.  No JVD or thyromegaly.  CHEST:  Clear to auscultation bilaterally.  No wheezing or rhonchi.  HEART:  Regular rate and rhythm.  No gross murmur.  ABDOMEN:  Good bowel sounds.  Nontender and nondistended.  No mass.  No  bruits.  EXTREMITIES:  No lower extremity edema.  No femoral bruits.  Palpable  distal PT pulses.  NEUROLOGIC:  Cranial nerves II through XII grossly intact.  She has  normal mood, but is in fact anxious.   Chest x-ray, nonacute.   LABORATORY STUDIES:  Urine pregnancy test negative.  Hemoglobin 12.5,  hematocrit 39.1, platelets 221, and white count 6.5.  Sodium 139,  potassium 3.4, BUN 8, creatinine 0.7, and glucose 80.  Point-of-care  markers negative x1.   EKG shows normal sinus rhythm with T-wave inversion in lead III,  rate  73.   ASSESSMENT AND PLAN:  1. Typical chest pain.  2. Palpitations.  3. Anxiety.   With the patient's history of recent positive Cardiolite and the fact  that her symptoms are so typical, she is being brought over to the  cardiac catheterization lab for coronary artery visualization.      Guy Franco, P.A.      Lyn Records, M.D.  Electronically Signed    LB/MEDQ  D:  06/07/2008  T:  06/08/2008  Job:  [id number]   cc:   Thora Lance, M.D.  Lyn Records, M.D.

## 2011-01-29 NOTE — Procedures (Signed)
NAME:  [name], [name] NO.:  [id number]   MEDICAL RECORD NO.:  [id number]          PATIENT TYPE:  OUT   LOCATION:  SLEEP CENTER                 FACILITY:  [facility]   PHYSICIAN:  Clinton D. Maple Hudson, M.D. DATE OF BIRTH:  12-21-1960   DATE OF STUDY:  08/09/2005                              NOCTURNAL POLYSOMNOGRAM   REFERRING PHYSICIAN:  Dr. Kirby Funk.   DATE OF STUDY:  August 09, 2005.   INDICATION FOR STUDY:  Insomnia with sleep apnea.   EPWORTH SLEEPINESS SCORE:  8/24.   BMI:  29.   WEIGHT:  185 pounds.   HOME MEDICATIONS:  Toprol XL and Xanax.   SLEEP ARCHITECTURE:  Total sleep time 386 minutes with sleep efficiency 90%.  Stage I was 6%, stage II 71%, stages III and IV were absent, REM 23% of  total sleep time. Sleep latency 11 minutes, REM latency 69 minutes, awake  after sleep onset 35 minutes, arousal index 29. Xanax was taken at bedtime.   RESPIRATORY DATA:  NPSG protocol requested. Apnea/hypopnea index (AHI, RDI)  18 obstructive events per hour indicating mild to moderate obstructive sleep  apnea/hypopnea syndrome. There were 28 obstructive apneas and 88 hypopneas.  Most sleep and therefore most events were while supine. REM AHI 48.5 per  hour.   OXYGEN DATA:  Moderate to loud snoring with oxygen desaturation to a nadir  of 88%. Mean oxygen saturation through the study was 96% on room air.   CARDIAC DATA:  Normal sinus rhythm.   MOVEMENT/PARASOMNIA:  A total of 65 limb jerks were reported but only two of  these were associated with arousal or awakening which is insignificant.  Bathroom x1.   IMPRESSION/RECOMMENDATIONS:  1.  Note that Xanax was taken at bedtime. Some changes of sleep architecture      may be related to this including sustained early stage II sleep.  2.  Mild to moderate obstructive sleep apnea/hypopnea syndrome, AHI 18 per      hour with moderate to loud snoring and oxygen desaturation to 88%.      Effective position change  cannot be determine from this studies since      she      slept only supine.  3.  Consider return for C-PAP titration or evaluate for alternative      therapies as appropriate.      Clinton D. Maple Hudson, M.D.  Diplomate, Biomedical engineer of Sleep Medicine  Electronically Signed     CDY/MEDQ  D:  08/15/2005 10:07:54  T:  08/15/2005 22:31:10  Job:  [id number]

## 2011-01-29 NOTE — H&P (Signed)
[name], KUKLINSKI NO.:  [id number]   MEDICAL RECORD NO.:  [id number]          PATIENT TYPE:  INP   LOCATION:  6526                         FACILITY:  [facility]   PHYSICIAN:  Ricki Rodriguez, M.D.  DATE OF BIRTH:  July 28, 1961   DATE OF ADMISSION:  12/20/2005  DATE OF DISCHARGE:                                HISTORY & PHYSICAL   CHIEF COMPLAINT:  Chest pain.   HISTORY OF PRESENT ILLNESS:  This 50 year old black female has recurrent  chest pain described as heaviness with shortness of breath without sweating  spell or radiation to left arm.  The patient had a treadmill stress test  with EKG changes of ischemia and had EKG of ischemia again with chest pain  without any activity.   The patient denies any drug use or known history of coronary artery disease.   PAST MEDICAL HISTORY:  Negative for diabetes, negative for hypertension,  negative for smoking, negative for alcohol use or drug use, and no history  of cholesterol abnormality, no history of myocardial infarction, no history  of obesity, no history of family history of premature coronary artery  disease.   PAST SURGICAL HISTORY:  None.   ALLERGIES:  DOXYCYCLINE giving rash.   MEDICATIONS:  1.  Toprol-XL 50 mg in the morning, 25 mg in the evening.  2.  Xanax 1 mg one or two daily as needed.  3.  Equetro 300 mg in the morning and 200 mg in the afternoon.  4.  Aspirin 81 mg daily.  5.  Plavix 75 mg one daily.  6.  Nexium 40 mg one daily.   PERSONAL HISTORY:  Patient is divorced in the last three months, has two  sons, 61 and 23 year old, and she works at [employer].   FAMILY HISTORY:  Mother living, age 18.  Father living, age 58.  Patient  does not have any brother or sister.   REVIEW OF SYSTEMS:  Positive for weight gain and positive for vision change  with patient wearing reading glasses.  No history of eye surgery, no history  of rhinorrhea, positive history of hearing loss in the right  ear and  positive history of tinnitus, no history of denture use, no history of  cough, hemoptysis, asthma, COPD, pneumonia, positive history of dyspnea and  palpitation, positive history of chest pain and leg edema, negative history  of claudication, nausea, vomiting, or diarrhea, positive history of  constipation, negative history of GI bleed, ulcer, hernia, hepatitis, blood  transfusion, kidney stone, stroke, seizure, psychiatric admissions except a  positive history of depression, no history of joint pain or skin rash.  Patient had a tetanus-diphtheria immunization shot 10 years ago.  No history  of flu shot or pneumonia shot intake.   PHYSICAL EXAMINATION:  VITAL SIGNS:  Pulse 90, respirations 14, blood  pressure 120/68, height 5 feet 6 inch, weight 185 pounds.  GENERAL:  Patient is alert and oriented x3.  HEAD:  Normocephalic, atraumatic.  EYES:  Manson Passey.  Pupils equally reacting to light.  Extraocular movement  intact.  EARS, NOSE, THROAT:  Mucous membranes  pink and moist.  NECK:  No JVD or carotid bruit.  LUNGS:  Clear bilaterally.  HEART:  Normal S1, S2.  ABDOMEN:  Soft and nontender.  EXTREMITIES:  No edema, cyanosis, clubbing.  CNS:  Cranial nerves grossly intact, and the patient has bilateral equal  grips.   LABORATORY DATA:  Pending.   EKG showed sinus rhythm with anterolateral T-wave changes.   ASSESSMENT:  1.  Chest pain, rule out myocardial infarction.  2.  Palpitation.  3.  Anxiety.   PLAN:  Admit patient to telemetry unit, rule out myocardial infarction, and  schedule for cardiac catheterization.      Ricki Rodriguez, M.D.  Electronically Signed     ASK/MEDQ  D:  12/20/2005  T:  12/20/2005  Job:  [id number]

## 2011-01-29 NOTE — Op Note (Signed)
   NAME:  [name], [name]                        ACCOUNT NO.:  [id number]   MEDICAL RECORD NO.:  [id number]                   PATIENT TYPE:  AMB   LOCATION:  ENDO                                 FACILITY:  [facility]   PHYSICIAN:  Danise Edge, M.D.                DATE OF BIRTH:  12-Feb-1961   DATE OF PROCEDURE:  01/09/2003  DATE OF DISCHARGE:                                 OPERATIVE REPORT   PROCEDURE:  Diagnostic colonoscopy.   REFERRING PHYSICIAN:  Thora Lance, M.D.   INDICATIONS:  The patient is a 50 year old female born 15-Jan-2061.  The patient  has intermittent abdominal pain with mushy stool but no gastrointestinal  bleeding.  Diagnostic colonoscopy is scheduled today.   ENDOSCOPIST:  Danise Edge, M.D.   PREMEDICATION:  Versed 5 mg, Demerol 30 mg.   DESCRIPTION OF PROCEDURE:  After obtaining informed consent, the patient was  placed in the left lateral decubitus position.  I administered intravenous  Demerol and intravenous Versed to achieve conscious sedation for the  procedure.  The patient's blood pressure, oxygen saturation, and cardiac  rhythm were monitored throughout the procedure and documented in the medical  record.   Anal inspection was normal.  Digital rectal exam was normal.  The Olympus  pediatric colonoscope was introduced into the rectum and easily advanced to  the cecum.  A normal-appearing ileocecal valve was intubated and the distal  ileum inspected.  Colonic preparation for the exam today was excellent.   Rectum normal.   Sigmoid colon and descending colon normal.   Splenic flexure normal.   Transverse colon normal.   Hepatic flexure normal.   Ascending colon normal.   Cecum and ileocecal valve normal.   Distal ileum normal.   ASSESSMENT:  Normal proctocolonoscopy to the cecum, including inspection of  the distal ileum.                                               Danise Edge, M.D.    MJ/MEDQ  D:  01/09/2003  T:  01/09/2003   Job:  [id number]   cc:   Thora Lance, M.D.  [address]  [place]  [place] [address]  Fax: [phone]

## 2011-01-29 NOTE — Discharge Summary (Signed)
NAME:  [name], [name] NO.:  [id number]   MEDICAL RECORD NO.:  [id number]          PATIENT TYPE:  IPS   LOCATION:  0502                          FACILITY:  [facility]   PHYSICIAN:  Geoffery Lyons, M.D.      DATE OF BIRTH:  1961/03/07   DATE OF ADMISSION:  09/18/2008  DATE OF DISCHARGE:  09/24/2008                               DISCHARGE SUMMARY   CHIEF COMPLAINT AND PRESENT ILLNESS:  This was the first admission to  [facility] for this 50 year old female requesting  detox from alprazolam.  She reports taking alprazolam at 0.5 extended  release about 4 years prior to this admission and then gradually through  the years had gone up on dose.  Aggravating factors include losing her  job in June 2009 which also increased her anxiety.  Through the years,  she has also developed palpitations for which she is being followed by a  cardiologist.  The palpitations cause her anxiety to increase and  increasing her to level of worrying for her health what triggers panic  attacks.  The dose of Xanax has been increased through the years, taking  about 3.5 mg per day and she would like to be off it.  Denies any other  substance use.  When she had tried to cut the Xanax, she experienced  increased anxiety, increase in her palpitations, rapid heart rate which  triggers her anxiety and her fears about her heart condition.   PAST PSYCHIATRIC HISTORY:  Used to see Phillip Heal, now followed by  Saul Fordyce, nurse practitioner.  Sees Bernadene Bell at [facility] for counseling.  She had been on  Equetro started by Dr. Jennelle Human several years ago when they thought that  she was possibly having bipolar disorder but she feels that she does not  have bipolarity and is more so anxiety.   MEDICAL HISTORY:  As already stated, palpitations, tachycardia.   MEDICATIONS:  1. Alprazolam taking up to 3.5 mg per day.  2. Atenolol 25 mg twice a day.  3.  Cardizem 30 mg per day.  4. Aspirin 81 mg per day.  5. Paxil 12.5 mg per day.   PHYSICAL EXAM:  Failed to show any acute findings.   LABORATORY WORKUP:  Carbamazepine level 5.8.  UDS positive for  benzodiazepines.  Basic metabolic panel within normal limits.  CBC  within normal limits.   MENTAL STATUS EXAM:  Reveals alert, cooperative female.  Pleasant,  appropriate.  Engaged in conversation.  Mood, anxiety.  Affect was of  anxiety.  Speech was normal in rate, tempo, and production.  Thought  processes were logical, coherent, and relevant.  No evidence of any  delusions.  No active suicidal or homicidal ideations.  Content mostly  with her wanting to be off the Xanax and the symptoms associated when  she tries to come off.  No hallucinations.  Cognition well preserved.   ADMITTING DIAGNOSES:  AXIS I:  Benzodiazepine dependence.  Anxiety  disorder, not otherwise specified.  AXIS II:  No diagnosis.  AXIS III:  Tachycardia.  Palpitations.  Sleep apnea.  AXIS IV:  Moderate.  AXIS V:  Upon admission, 35 to 40.  Her Global Assessment of Functioning  in the last year 77.   COURSE IN THE HOSPITAL:  She was admitted, started individual and group  psychotherapy.  We started detoxing with Librium.  She was reassured of  the fact that she was taking an anti-convulsant, was probably going to  be helpful when coming off of the benzodiazepine in terms of the  possibility of having any seizures coming off.  As the hospitalization  progressed, she was able to continue opening up.  Endorsed anxiety all  her life, last few years worse.  Endorsed having lost her job, financial  difficulty, health issues, all that were contributing factors in  maintaining the anxiety going on.  She was placed on Celexa before and  she had a skin reaction to it for what she is pretty worried about  taking any antidepressant.  On September 20, 2008, she endorsed that she  was starting to feel better.  Did sleep which she  has not been able to  accomplish for several days before she came in.  Worked on Materials engineer, stress management.  Did endorse that she was already benefitting  from being in the hospital.  Her therapist, Bernadene Bell, came by and  worked with her while she was in the unit.  We continued to work with  the Librium.  She experienced no worsening of her tachycardia or her  palpitations thus we went down on the Librium.  She felt so much better  so she started choosing not to take the Librium as it was scheduled  having no adverse consequences.  By September 24, 2008, she was in full  contact with reality, off the Xanax, off the Librium.  Anxiety she felt  was under better control.  No evidence of worsening of the tachycardia  or the palpitation.  Encouraged by the fact that it went easier than she  expected.  Willing and motivated to pursue outpatient treatment, pretty  satisfied that she was off benzodiazepine.   DISCHARGE DIAGNOSES:  AXIS I:  Benzodiazepine dependence status post  benzodiazepine withdrawal.  Anxiety disorder, not otherwise specified.  AXIS II:  No diagnosis.  AXIS III:  Tachycardia.  Palpitations.  Obstructive sleep apnea.  AXIS IV:  Moderate.  AXIS V:  Upon discharge, 60.   Discharged on:  1. Atenolol 25 mg twice a day.  2. Cardizem 20 mg per day.  3. Paxil CR 12.5 mg per day.  4. Equetro 300 mg per day.  5. Aspirin 81 mg per day.   She is going to ask her prescriber, Saul Fordyce, to reassess the need  for the [facility].   FOLLOWUP:  Saul Fordyce, [address], and Guerry Bruin  [facility].      Geoffery Lyons, M.D.  Electronically Signed     IL/MEDQ  D:  10/11/2008  T:  10/11/2008  Job:  [id number]

## 2011-01-29 NOTE — Cardiovascular Report (Signed)
NAMEMARCELLA, CHARLSON NO.:  [id number]   MEDICAL RECORD NO.:  [id number]          PATIENT TYPE:  INP   LOCATION:  6526                         FACILITY:  [facility]   PHYSICIAN:  Ricki Rodriguez, M.D.  DATE OF BIRTH:  1961-07-25   DATE OF PROCEDURE:  12/21/2005  DATE OF DISCHARGE:                              CARDIAC CATHETERIZATION   PROCEDURES:  1.  Left heart catheterization.  2.  Selective coronary angiography.  3.  Left ventricle function study.   INDICATIONS FOR PROCEDURE:  This 50 year old black female with recurrent  chest pain, EKG changes of ischemia, and abnormal stress test.   APPROACH:  Right femoral artery using 4 French sheath and catheters.   COMPLICATIONS:  None.   HEMODYNAMIC DATA:  The left ventricular pressure was 150/13/23, and aortic  pressure was 150/102/124.   Coronary anatomy:  The left main coronary artery was unremarkable.   Left anterior descending coronary artery:  The left anterior descending  coronary artery was also unremarkable.  Near apex of the heart it split into  two branches, giving the appearance of abrupt ending or distal blockage.  No  change in distal vessel with 200 mcg intra-coronary nitroglycerin.   Diagonal #1 vessel was a large vessel and unremarkable.   Left circumflex coronary artery was essentially unremarkable.  It had a  large ramus branch.  Obtuse marginal branch #1 and #2 were very small, and  obtuse marginal branch #3, #4, and #5 were unremarkable.   Right coronary artery:  The right coronary artery was dominant and  unremarkable.  Its posterior lateral branch was unremarkable and posterior  descending coronary artery was also normal.   Left ventriculogram:  The left ventriculogram had catheter-induced  tachycardia, otherwise, had normal left ventricular systolic function with  ejection fraction of 70%.   IMPRESSION:  1.  Near normal coronaries.  2.  Normal left ventricular systolic function.   RECOMMENDATIONS:  This patient will undergo non-cardiac chest pain  evaluation.      Ricki Rodriguez, M.D.  Electronically Signed     ASK/MEDQ  D:  12/21/2005  T:  12/21/2005  Job:  [id number]

## 2011-01-29 NOTE — Discharge Summary (Signed)
[name], [name]              ACCOUNT NO.:  [id number]   MEDICAL RECORD NO.:  [id number]          PATIENT TYPE:  INP   LOCATION:  6526                         FACILITY:  [facility]   PHYSICIAN:  Ricki Rodriguez, M.D.  DATE OF BIRTH:  03/10/1961   DATE OF ADMISSION:  12/20/2005  DATE OF DISCHARGE:  12/21/2005                                 DISCHARGE SUMMARY   DISCHARGE DIAGNOSES:  1.  Noncardiac chest pain.  2.  Hyperkalemia.  3.  Anxiety.   DISCHARGE MEDICATIONS:  1.  The patient is to continue Toprol 50 mg in the a.m. and 25 mg in the      p.m.  2.  Xanax 1 mg one or two daily as needed.  3.  Equetro 300 mg in the a.m. and 200 mg in the afternoon.   DISCHARGE INSTRUCTIONS:  The patient is to continue caffeine-free soda  intake as needed.   HISTORY OF PRESENT ILLNESS:  This 50 year old black female presented with  recurrent chest pain, EKG abnormality of anterior wall ischemia, and  palpitations off and on.  The patient had abnormal stress test, but  unremarkable stress echocardiogram.   PHYSICAL EXAMINATION:  VITAL SIGNS:  Pulse 70, respirations 12, blood  pressure 120s/80s, height 66 inches, weight approximately 185 pounds.  GENERAL:  The patient was alert and oriented x3 in no acute distress.  HEENT:  The patient was normocephalic and atraumatic with brown eyes.  Pupils equal, round, and reactive to light.  Extraocular movements intact.  Mucous membranes pink and moist.  NECK:  No JVD, no carotid bruit.  LUNGS:  Clear bilaterally.  HEART:  Normal S1 and S2.  ABDOMEN:  Soft and nontender.  EXTREMITIES:  No cyanosis, clubbing, or edema.  NEUROLOGY:  Cranial nerves II-XII grossly intact.  Bilateral equal grips.   LABORATORY DATA:  Normal hemoglobin, hematocrit, WBC count, and platelet  count.  Normal electrolytes except for potassium of 2.7, improved to 3.9  after potassium supplement.   Cardiac catheterization showed near normal coronaries with a small area at  the  apex of the heart showing abrupt cutoff of left anterior descending  coronary artery and a good left ventricular systolic function.   HOSPITAL COURSE:  The patient was admitted to telemetry unit.  She underwent  cardiac catheterization after correcting her hypokalemia with potassium  supplements.  Her cardiac catheterization failed to show any significant  coronary artery disease.  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  She had  no complications post procedure, hence, she was discharged home in  satisfactory condition with follow-up by me in 1 week.      Ricki Rodriguez, M.D.  Electronically Signed     ASK/MEDQ  D:  12/21/2005  T:  12/21/2005  Job:  [id number]

## 2011-02-24 ENCOUNTER — Emergency Department (HOSPITAL_COMMUNITY)
Admission: EM | Admit: 2011-02-24 | Discharge: 2011-02-25 | Disposition: A | Discharge status: Home / Self Care | Payer: BC Managed Care – PPO | Attending: Emergency Medicine | Admitting: Emergency Medicine

## 2011-02-24 DIAGNOSIS — R112 Nausea with vomiting, unspecified: Secondary | ICD-10-CM | POA: Insufficient documentation

## 2011-02-24 DIAGNOSIS — R197 Diarrhea, unspecified: Secondary | ICD-10-CM | POA: Insufficient documentation

## 2011-02-24 DIAGNOSIS — I059 Rheumatic mitral valve disease, unspecified: Secondary | ICD-10-CM | POA: Insufficient documentation

## 2011-02-24 DIAGNOSIS — Z79899 Other long term (current) drug therapy: Secondary | ICD-10-CM | POA: Insufficient documentation

## 2011-02-25 LAB — DIFFERENTIAL
Basophils Absolute: 0 10*3/uL (ref 0.0–0.1)
Basophils Relative: 0 % (ref 0–1)
Eosinophils Absolute: 0 10*3/uL (ref 0.0–0.7)
Lymphocytes Relative: 11 % — ABNORMAL LOW (ref 12–46)
Lymphs Abs: 0.8 10*3/uL (ref 0.7–4.0)
Monocytes Absolute: 0.7 10*3/uL (ref 0.1–1.0)
Neutro Abs: 5.6 10*3/uL (ref 1.7–7.7)

## 2011-02-25 LAB — CBC
MCH: 21.9 pg — ABNORMAL LOW (ref 26.0–34.0)
MCHC: 31.5 g/dL (ref 30.0–36.0)
MCV: 69.6 fL — ABNORMAL LOW (ref 78.0–100.0)
Platelets: 223 10*3/uL (ref 150–400)
RDW: 16.7 % — ABNORMAL HIGH (ref 11.5–15.5)

## 2011-02-25 LAB — POCT I-STAT, CHEM 8
BUN: 4 mg/dL — ABNORMAL LOW (ref 6–23)
Calcium, Ion: 1 mmol/L — ABNORMAL LOW (ref 1.12–1.32)
Creatinine, Ser: 0.7 mg/dL (ref 0.4–1.2)
Hemoglobin: 12.9 g/dL (ref 12.0–15.0)
Sodium: 137 mEq/L (ref 135–145)
TCO2: 24 mmol/L (ref 0–100)

## 2011-02-26 ENCOUNTER — Ambulatory Visit (HOSPITAL_COMMUNITY): Admission: RE | Admit: 2011-02-26 | Payer: Self-pay | Source: Ambulatory Visit | Admitting: Obstetrics and Gynecology

## 2011-04-29 ENCOUNTER — Emergency Department (HOSPITAL_COMMUNITY)
Admission: EM | Admit: 2011-04-29 | Discharge: 2011-04-29 | Disposition: A | Discharge status: Home / Self Care | Payer: BC Managed Care – PPO | Attending: Emergency Medicine | Admitting: Emergency Medicine

## 2011-04-29 DIAGNOSIS — R21 Rash and other nonspecific skin eruption: Secondary | ICD-10-CM | POA: Insufficient documentation

## 2011-04-29 DIAGNOSIS — F411 Generalized anxiety disorder: Secondary | ICD-10-CM | POA: Insufficient documentation

## 2011-06-04 LAB — CBC
HCT: 37.7
Hemoglobin: 12.1
MCHC: 32.2
MCV: 77.4 — ABNORMAL LOW
Platelets: 203
Platelets: 214
RBC: 4.87
RDW: 14.5
WBC: 6
WBC: 7

## 2011-06-04 LAB — POCT CARDIAC MARKERS
CKMB, poc: <1 — ABNORMAL LOW
Myoglobin, poc: 32.5
Operator id: 1192
Operator id: 1192

## 2011-06-04 LAB — DIFFERENTIAL
Basophils Absolute: 0
Basophils Relative: 1
Eosinophils Absolute: 0
Eosinophils Absolute: 0
Eosinophils Relative: 1
Lymphocytes Relative: 22
Lymphocytes Relative: 24
Lymphs Abs: 1.3
Lymphs Abs: 1.7
Monocytes Absolute: 0.8
Monocytes Relative: 11
Neutro Abs: 3.9
Neutro Abs: 4.4
Neutrophils Relative %: 64
Neutrophils Relative %: 64

## 2011-06-04 LAB — URINALYSIS, ROUTINE W REFLEX MICROSCOPIC
Glucose, UA: NEGATIVE
pH: 7

## 2011-06-04 LAB — URINE MICROSCOPIC-ADD ON

## 2011-06-04 LAB — BASIC METABOLIC PANEL WITH GFR
CO2: 29
Glucose, Bld: 82
Potassium: 3.4 — ABNORMAL LOW
Sodium: 139

## 2011-06-04 LAB — BASIC METABOLIC PANEL
BUN: 3 — ABNORMAL LOW
BUN: 5 — ABNORMAL LOW
Calcium: 8.7
Calcium: 9.1
Chloride: 105
Creatinine, Ser: 0.78
Creatinine, Ser: 0.87
GFR calc Af Amer: >60
GFR calc non Af Amer: >60
GFR calc non Af Amer: >60

## 2011-06-04 LAB — POCT URINALYSIS DIP (DEVICE)
Bilirubin Urine: NEGATIVE
Glucose, UA: NEGATIVE
Ketones, ur: NEGATIVE
Operator id: 116391
Specific Gravity, Urine: 1.02
Urobilinogen, UA: 0.2

## 2011-06-08 LAB — CBC
MCHC: 32.6
Platelets: 193
RDW: 15

## 2011-06-08 LAB — POCT URINALYSIS DIP (DEVICE)
Glucose, UA: NEGATIVE
Hgb urine dipstick: NEGATIVE
Nitrite: NEGATIVE
Urobilinogen, UA: 0.2

## 2011-06-08 LAB — D-DIMER, QUANTITATIVE: D-Dimer, Quant: 0.27

## 2011-06-08 LAB — DIFFERENTIAL
Basophils Absolute: 0.1
Basophils Relative: 1
Monocytes Absolute: 0.9
Neutro Abs: 4.6
Neutrophils Relative %: 63

## 2011-06-08 LAB — URINALYSIS, ROUTINE W REFLEX MICROSCOPIC
Glucose, UA: NEGATIVE
Protein, ur: NEGATIVE

## 2011-06-08 LAB — URINE MICROSCOPIC-ADD ON

## 2011-06-08 LAB — BASIC METABOLIC PANEL
GFR calc non Af Amer: >60
Potassium: 3.2 — ABNORMAL LOW
Sodium: 138

## 2011-06-08 LAB — URINE CULTURE

## 2011-06-09 LAB — CBC
HCT: 36.3
HCT: 41
MCV: 77.4 — ABNORMAL LOW
MCV: 79.1
RBC: 4.69
RBC: 5.18 — ABNORMAL HIGH
WBC: 4.2
WBC: 7

## 2011-06-09 LAB — POCT I-STAT, CHEM 8
BUN: 5 — ABNORMAL LOW
Chloride: 104
Creatinine, Ser: 1.1
Glucose, Bld: 107 — ABNORMAL HIGH
Hemoglobin: 15
Potassium: 3.3 — ABNORMAL LOW
Sodium: 140

## 2011-06-09 LAB — POCT CARDIAC MARKERS
CKMB, poc: <1 — ABNORMAL LOW
Myoglobin, poc: 36.8
Operator id: 264421
Troponin i, poc: <0.05

## 2011-06-09 LAB — DIFFERENTIAL
Eosinophils Absolute: 0
Eosinophils Relative: 0
Lymphs Abs: 1.6
Monocytes Relative: 16 — ABNORMAL HIGH

## 2011-06-09 LAB — URINALYSIS, ROUTINE W REFLEX MICROSCOPIC
Bilirubin Urine: NEGATIVE
Hgb urine dipstick: NEGATIVE
Protein, ur: NEGATIVE
Urobilinogen, UA: 0.2

## 2011-06-09 LAB — D-DIMER, QUANTITATIVE: D-Dimer, Quant: 0.26

## 2011-06-09 LAB — URINE MICROSCOPIC-ADD ON

## 2011-06-11 LAB — CBC
MCHC: 31.4
Platelets: 194
RBC: 4.67
RDW: 14.4

## 2011-06-11 LAB — BASIC METABOLIC PANEL
BUN: 7
CO2: 25
Calcium: 8.7
Creatinine, Ser: 0.83
GFR calc Af Amer: >60
Glucose, Bld: 92

## 2011-06-11 LAB — POCT CARDIAC MARKERS
CKMB, poc: <1 — ABNORMAL LOW
Myoglobin, poc: 46.5
Troponin i, poc: <0.05

## 2011-06-11 LAB — DIFFERENTIAL
Basophils Absolute: 0
Basophils Relative: 0
Eosinophils Absolute: 0
Neutro Abs: 5.8
Neutrophils Relative %: 79 — ABNORMAL HIGH

## 2011-06-14 LAB — URINE MICROSCOPIC-ADD ON

## 2011-06-14 LAB — POCT I-STAT, CHEM 8
Calcium, Ion: 1.12
Glucose, Bld: 85
HCT: 40
TCO2: 23

## 2011-06-14 LAB — CBC
MCHC: 32
MCV: 78.8
Platelets: 221
RBC: 4.96
WBC: 6.5

## 2011-06-14 LAB — CATECHOLAMINES, FRACTIONATED, PLASMA
Dopamine: 35 pg/mL — ABNORMAL HIGH (ref <60)
Epinephrine: 35 pg/mL (ref <84)
Norepinephrine: 159 pg/mL (ref <420)
Total Catecholamines (Nor+Epi): 194 pg/mL (ref <504)

## 2011-06-14 LAB — BASIC METABOLIC PANEL
BUN: 5 — ABNORMAL LOW
Chloride: 110
Creatinine, Ser: 0.64

## 2011-06-14 LAB — T4: T4, Total: 6.6

## 2011-06-14 LAB — DIFFERENTIAL
Eosinophils Absolute: 0.1
Eosinophils Relative: 1
Lymphocytes Relative: 23
Lymphs Abs: 1.5
Monocytes Absolute: 0.9
Monocytes Relative: 14 — ABNORMAL HIGH

## 2011-06-14 LAB — POCT CARDIAC MARKERS
Myoglobin, poc: 30.1
Myoglobin, poc: 42.9

## 2011-06-14 LAB — URINALYSIS, ROUTINE W REFLEX MICROSCOPIC
Glucose, UA: NEGATIVE
Ketones, ur: NEGATIVE
Protein, ur: NEGATIVE
pH: 7

## 2011-06-14 LAB — COMPREHENSIVE METABOLIC PANEL
ALT: 17
AST: 21
Albumin: 3.8
CO2: 29
Calcium: 8.9
Creatinine, Ser: 0.7
GFR calc Af Amer: >60
Sodium: 139
Total Protein: 6.8

## 2011-06-14 LAB — T4, FREE: Free T4: 0.95

## 2011-06-14 LAB — T3: T3, Total: 126.2 (ref 80.0–204.0)

## 2011-06-14 LAB — T3 UPTAKE: T3 Uptake Ratio: 32.8

## 2011-06-16 LAB — POCT I-STAT, CHEM 8
Creatinine, Ser: 0.8
HCT: 37
Hemoglobin: 12.6
Potassium: 3 — ABNORMAL LOW
Sodium: 142

## 2011-06-16 LAB — POCT CARDIAC MARKERS
CKMB, poc: <1 — ABNORMAL LOW
Myoglobin, poc: 36.5
Troponin i, poc: <0.05

## 2011-06-16 LAB — DIFFERENTIAL
Basophils Relative: 1
Eosinophils Absolute: 0
Lymphs Abs: 1.5
Neutrophils Relative %: 61

## 2011-06-16 LAB — CBC
MCV: 79.7
Platelets: 200
WBC: 5.8

## 2011-06-18 LAB — BASIC METABOLIC PANEL
BUN: 3 — ABNORMAL LOW
Calcium: 8.3 — ABNORMAL LOW
Creatinine, Ser: 0.65
GFR calc non Af Amer: >60

## 2011-06-18 LAB — POCT CARDIAC MARKERS: Operator id: 2725

## 2011-06-22 LAB — CBC
HCT: 38.6
MCV: 77.4 — ABNORMAL LOW
Platelets: 247
RDW: 14.3

## 2011-06-22 LAB — COMPREHENSIVE METABOLIC PANEL
Albumin: 3.5
BUN: 5 — ABNORMAL LOW
Creatinine, Ser: 0.72
Total Bilirubin: 0.6
Total Protein: 6.1

## 2011-06-22 LAB — DIFFERENTIAL
Basophils Absolute: 0
Lymphocytes Relative: 19
Monocytes Absolute: 0.4
Neutro Abs: 3.3
Neutrophils Relative %: 71

## 2011-06-25 LAB — DIFFERENTIAL
Basophils Absolute: 0
Basophils Relative: 1
Eosinophils Absolute: 0.1
Eosinophils Relative: 1
Lymphocytes Relative: 23
Lymphs Abs: 1.3
Monocytes Absolute: 0.6
Monocytes Relative: 11
Neutro Abs: 3.7
Neutrophils Relative %: 64

## 2011-06-25 LAB — URINALYSIS, ROUTINE W REFLEX MICROSCOPIC
Bilirubin Urine: NEGATIVE
Glucose, UA: NEGATIVE
Hgb urine dipstick: NEGATIVE
Ketones, ur: NEGATIVE
Nitrite: NEGATIVE
Protein, ur: NEGATIVE
Specific Gravity, Urine: 1.01
Urobilinogen, UA: 0.2
pH: 7.5

## 2011-06-25 LAB — BASIC METABOLIC PANEL
GFR calc Af Amer: >60
GFR calc non Af Amer: >60
Glucose, Bld: 99
Potassium: 3.4 — ABNORMAL LOW
Sodium: 143

## 2011-06-25 LAB — URINE MICROSCOPIC-ADD ON

## 2011-06-25 LAB — CBC
HCT: 35.5 — ABNORMAL LOW
Hemoglobin: 11.5 — ABNORMAL LOW
MCHC: 32.3
MCV: 78.2
Platelets: 221
RBC: 4.55
RDW: 14.9 — ABNORMAL HIGH
WBC: 5.8

## 2011-06-25 LAB — PREGNANCY, URINE: Preg Test, Ur: NEGATIVE

## 2011-06-28 LAB — CBC
Hemoglobin: 11.9 — ABNORMAL LOW
MCHC: 32.5
RBC: 4.71
RDW: 14.4 — ABNORMAL HIGH

## 2011-06-28 LAB — DIFFERENTIAL
Basophils Absolute: 0.1
Basophils Relative: 1
Lymphocytes Relative: 12
Monocytes Absolute: 0.7
Monocytes Relative: 8
Neutro Abs: 6.7
Neutrophils Relative %: 78 — ABNORMAL HIGH

## 2011-06-28 LAB — BASIC METABOLIC PANEL
CO2: 25
Calcium: 8.8
Creatinine, Ser: 0.71
GFR calc Af Amer: >60
GFR calc non Af Amer: >60
Sodium: 140

## 2011-07-19 ENCOUNTER — Ambulatory Visit
Admission: RE | Admit: 2011-07-19 | Discharge: 2011-07-19 | Disposition: A | Discharge status: Home / Self Care | Payer: BC Managed Care – PPO | Source: Ambulatory Visit | Attending: Cardiovascular Disease | Admitting: Cardiovascular Disease

## 2011-07-19 ENCOUNTER — Other Ambulatory Visit: Payer: Self-pay | Admitting: Cardiovascular Disease

## 2011-07-19 DIAGNOSIS — M549 Dorsalgia, unspecified: Secondary | ICD-10-CM

## 2011-09-14 DIAGNOSIS — I499 Cardiac arrhythmia, unspecified: Secondary | ICD-10-CM

## 2011-09-14 HISTORY — DX: Cardiac arrhythmia, unspecified: I49.9

## 2011-09-20 ENCOUNTER — Other Ambulatory Visit: Payer: Self-pay | Admitting: Obstetrics and Gynecology

## 2011-09-20 ENCOUNTER — Other Ambulatory Visit: Payer: Self-pay | Admitting: Cardiovascular Disease

## 2011-09-20 ENCOUNTER — Ambulatory Visit
Admission: RE | Admit: 2011-09-20 | Discharge: 2011-09-20 | Disposition: A | Discharge status: Home / Self Care | Payer: BC Managed Care – PPO | Source: Ambulatory Visit | Attending: Cardiovascular Disease | Admitting: Cardiovascular Disease

## 2011-09-20 DIAGNOSIS — J40 Bronchitis, not specified as acute or chronic: Secondary | ICD-10-CM

## 2011-09-20 DIAGNOSIS — Z1231 Encounter for screening mammogram for malignant neoplasm of breast: Secondary | ICD-10-CM

## 2011-09-23 ENCOUNTER — Ambulatory Visit: Payer: BC Managed Care – PPO

## 2011-09-27 DIAGNOSIS — J40 Bronchitis, not specified as acute or chronic: Secondary | ICD-10-CM | POA: Insufficient documentation

## 2011-09-27 DIAGNOSIS — R079 Chest pain, unspecified: Secondary | ICD-10-CM | POA: Insufficient documentation

## 2011-09-27 DIAGNOSIS — R070 Pain in throat: Secondary | ICD-10-CM | POA: Insufficient documentation

## 2011-09-27 DIAGNOSIS — J3489 Other specified disorders of nose and nasal sinuses: Secondary | ICD-10-CM | POA: Insufficient documentation

## 2011-09-27 DIAGNOSIS — R0602 Shortness of breath: Secondary | ICD-10-CM | POA: Insufficient documentation

## 2011-09-27 DIAGNOSIS — R05 Cough: Secondary | ICD-10-CM | POA: Insufficient documentation

## 2011-09-27 DIAGNOSIS — R059 Cough, unspecified: Secondary | ICD-10-CM | POA: Insufficient documentation

## 2011-09-28 ENCOUNTER — Other Ambulatory Visit: Payer: Self-pay

## 2011-09-28 ENCOUNTER — Encounter (HOSPITAL_COMMUNITY): Payer: Self-pay

## 2011-09-28 ENCOUNTER — Emergency Department (HOSPITAL_COMMUNITY)
Admission: EM | Admit: 2011-09-28 | Discharge: 2011-09-28 | Disposition: A | Discharge status: Home / Self Care | Payer: BC Managed Care – PPO | Attending: Emergency Medicine | Admitting: Emergency Medicine

## 2011-09-28 DIAGNOSIS — J40 Bronchitis, not specified as acute or chronic: Secondary | ICD-10-CM

## 2011-09-28 HISTORY — DX: Nonrheumatic mitral (valve) prolapse: I34.1

## 2011-09-28 MED ORDER — BENZONATATE 100 MG PO CAPS
100.0000 mg | ORAL_CAPSULE | Freq: Once | ORAL | Status: AC
  Administered 2011-09-28: 100 mg via ORAL
  Filled 2011-09-28 (×2): qty 1

## 2011-09-28 MED ORDER — BENZONATATE 100 MG PO CAPS: 100.0000 mg | ORAL_CAPSULE | Freq: Two times a day (BID) | ORAL | Status: DC | PRN

## 2011-09-28 MED ORDER — ALBUTEROL SULFATE HFA 108 (90 BASE) MCG/ACT IN AERS
2.0000 | INHALATION_SPRAY | Freq: Once | RESPIRATORY_TRACT | Status: AC
  Administered 2011-09-28: 2 via RESPIRATORY_TRACT
  Filled 2011-09-28: qty 6.7

## 2011-09-28 NOTE — ED Provider Notes (Signed)
Medical screening examination/treatment/procedure(s) were performed by non-physician practitioner and as supervising physician I was immediately available for consultation/collaboration.   Trayvion Embleton A. [place], MD 09/28/11 2258 

## 2011-09-28 NOTE — ED Notes (Signed)
Pt complains of a dry cough and congestion, taking the zpack now, has been sick for over one week, pt continues to feel weak and congested

## 2011-09-28 NOTE — ED Provider Notes (Signed)
History     CSN: [id number]  Arrival date & time 09/27/11  2347   First MD Initiated Contact with Patient 09/28/11 0046      Chief Complaint  Patient presents with  . Nasal Congestion  . Cough    (Consider location/radiation/quality/duration/timing/severity/associated sxs/prior treatment) HPI Comments: Patient here with complaints of cough for 1 week - states just diagnosed with bronchitis and is supposed to take last zithromax tomorrow - states continues with cough but shortness of breath started tonight associated with cough - no fever or chills.  Patient is a 51 y.o. female presenting with cough. The history is provided by the patient. No language interpreter was used.  Cough This is a recurrent problem. The current episode started more than 1 week ago. The problem occurs constantly. The problem has not changed since onset.The cough is productive of sputum. There has been no fever. Associated symptoms include chest pain, rhinorrhea, sore throat and shortness of breath. Pertinent negatives include no chills, no sweats, no weight loss, no ear congestion, no ear pain, no headaches, no myalgias and no wheezing. She has tried cough syrup for the symptoms. The treatment provided no relief. She is not a smoker. Her past medical history is significant for bronchitis. Her past medical history does not include pneumonia, bronchiectasis, COPD, emphysema or asthma.    Past Medical History  Diagnosis Date  . Mitral valve prolapse     History reviewed. No pertinent past surgical history.  History reviewed. No pertinent family history.  History  Substance Use Topics  . Smoking status: Not on file  . Smokeless tobacco: Not on file  . Alcohol Use: No    OB History    Grav Para Term Preterm Abortions TAB SAB Ect Mult Living                  Review of Systems  Constitutional: Negative for chills and weight loss.  HENT: Positive for sore throat and rhinorrhea. Negative for ear pain.     Respiratory: Positive for cough and shortness of breath. Negative for wheezing.   Cardiovascular: Positive for chest pain.  Musculoskeletal: Negative for myalgias.  Neurological: Negative for headaches.  All other systems reviewed and are negative.    Allergies  Ceftriaxone sodium and Doxycycline  Home Medications   Current Outpatient Rx  Name Route Sig Dispense Refill  . ATENOLOL 100 MG PO TABS Oral Take 100 mg by mouth daily.      BP 100/63  Pulse 75  Temp(Src) 98.7 F (37.1 C) (Oral)  Resp 20  SpO2 100%  Physical Exam  Nursing note and vitals reviewed. Constitutional: She is oriented to person, place, and time. She appears well-developed and well-nourished. No distress.  HENT:  Head: Normocephalic and atraumatic.  Right Ear: External ear normal.  Left Ear: External ear normal.  Nose: Nose normal.  Mouth/Throat: Oropharynx is clear and moist. No oropharyngeal exudate.  Eyes: Conjunctivae are normal. Pupils are equal, round, and reactive to light. No scleral icterus.  Neck: Normal range of motion. Neck supple.  Cardiovascular: Normal rate, regular rhythm and normal heart sounds.  Exam reveals no gallop and no friction rub.   No murmur heard. Pulmonary/Chest: Effort normal and breath sounds normal. She exhibits no tenderness.  Abdominal: Soft. Bowel sounds are normal. She exhibits no distension and no mass. There is no tenderness.  Musculoskeletal: Normal range of motion.  Lymphadenopathy:    She has no cervical adenopathy.  Neurological: She is alert and oriented  to person, place, and time.  Skin: Skin is warm and dry.  Psychiatric: She has a normal mood and affect. Her behavior is normal. Judgment and thought content normal.    ED Course  Procedures (including critical care time)  Labs Reviewed - No data to display No results found.   Date: 09/28/2011  Rate: 64  Rhythm: normal sinus rhythm  QRS Axis: normal  Intervals: normal  ST/T Wave abnormalities:  normal  Conduction Disutrbances:none  Narrative Interpretation: Reviewed by Dr. Juleen China  Old EKG Reviewed: none available   Bronchitis    MDM  Patient continues with symptoms of bronchitis - I discussed with the patient that we would treat her with the same thing, I have added an inhaler to help with the bronchospasm as well - have suggested a decongestant as well.        [name], [place] 09/28/11 0110

## 2011-10-03 ENCOUNTER — Encounter (HOSPITAL_COMMUNITY): Payer: Self-pay | Admitting: Emergency Medicine

## 2011-10-03 ENCOUNTER — Emergency Department (HOSPITAL_COMMUNITY): Payer: BC Managed Care – PPO

## 2011-10-03 ENCOUNTER — Emergency Department (HOSPITAL_COMMUNITY)
Admission: EM | Admit: 2011-10-03 | Discharge: 2011-10-03 | Disposition: A | Discharge status: Home / Self Care | Payer: BC Managed Care – PPO | Attending: Emergency Medicine | Admitting: Emergency Medicine

## 2011-10-03 DIAGNOSIS — R0989 Other specified symptoms and signs involving the circulatory and respiratory systems: Secondary | ICD-10-CM | POA: Insufficient documentation

## 2011-10-03 DIAGNOSIS — R05 Cough: Secondary | ICD-10-CM

## 2011-10-03 DIAGNOSIS — R059 Cough, unspecified: Secondary | ICD-10-CM | POA: Insufficient documentation

## 2011-10-03 DIAGNOSIS — IMO0001 Reserved for inherently not codable concepts without codable children: Secondary | ICD-10-CM | POA: Insufficient documentation

## 2011-10-03 MED ORDER — HYDROCODONE-HOMATROPINE 5-1.5 MG/5ML PO SYRP: 5.0000 mL | ORAL_SOLUTION | Freq: Four times a day (QID) | ORAL | Status: DC | PRN

## 2011-10-03 NOTE — ED Notes (Signed)
Has been to md x2 for the cough and was given a z pack 1 week ago still not getting any better non productive cough, sats 100%, had an chest x ray done a few weeks ago

## 2011-10-03 NOTE — ED Provider Notes (Signed)
Medical screening examination/treatment/procedure(s) were performed by non-physician practitioner and as supervising physician I was immediately available for consultation/collaboration.   Abdulai Blaylock, MD 10/03/11 1514 

## 2011-10-03 NOTE — ED Provider Notes (Signed)
History     CSN: [id number]  Arrival date & time 10/03/11  1226   First MD Initiated Contact with Patient 10/03/11 1342      Chief Complaint  Patient presents with  . Cough  . Nasal Congestion    (Consider location/radiation/quality/duration/timing/severity/associated sxs/prior treatment) HPI Comments: Patient presents emergency department after having flulike symptoms for 3 weeks.  Patient states that she was given Z-Pak in the beginning and then evaluated here being told that she had a viral syndrome.  Patient denies fevers, night sweats, chills and states that she no longer is having symptoms of congestion and headache.  However she presents with a lingering cough.  Her cough is dry in nature.  Patient denies chest pain shortness of breath.  The history is provided by the patient.    Past Medical History  Diagnosis Date  . Mitral valve prolapse     History reviewed. No pertinent past surgical history.  No family history on file.  History  Substance Use Topics  . Smoking status: Not on file  . Smokeless tobacco: Not on file  . Alcohol Use: No    OB History    Grav Para Term Preterm Abortions TAB SAB Ect Mult Living                  Review of Systems  Constitutional: Negative for fever, chills and fatigue.  HENT: Negative for ear pain, congestion, sore throat, rhinorrhea, sneezing, neck pain, neck stiffness, sinus pressure and tinnitus.   Eyes: Negative for visual disturbance.  Respiratory: Positive for cough. Negative for chest tightness.   Cardiovascular: Negative for chest pain and palpitations.  Gastrointestinal: Negative for nausea, vomiting, abdominal pain and diarrhea.  Genitourinary: Negative for dysuria.  Musculoskeletal: Positive for myalgias.  Skin: Negative for color change and rash.  Neurological: Negative for dizziness and weakness.  Hematological: Does not bruise/bleed easily.  Psychiatric/Behavioral: Negative for confusion.  All other systems  reviewed and are negative.    Allergies  Ceftriaxone sodium and Doxycycline  Home Medications   Current Outpatient Rx  Name Route Sig Dispense Refill  . ATENOLOL 25 MG PO TABS Oral Take 25 mg by mouth daily.    [name] VITAMIN D 1000 UNITS PO TABS Oral Take 1,000 Units by mouth daily.    [name] PAROXETINE HCL ER 25 MG PO TB24 Oral Take 25 mg by mouth every morning.      BP 125/68  Pulse 71  Temp(Src) 98.2 F (36.8 C) (Oral)  Resp 18  SpO2 100%  Physical Exam  Vitals reviewed. Constitutional: She is oriented to person, place, and time. Vital signs are normal. She appears well-developed and well-nourished. No distress.  HENT:  Head: Normocephalic and atraumatic. No trismus in the jaw.  Right Ear: External ear normal. No drainage or tenderness. No mastoid tenderness.  Left Ear: External ear normal. No drainage or tenderness. No mastoid tenderness.  Nose: Nose normal. No rhinorrhea or sinus tenderness.  Mouth/Throat: Uvula is midline, oropharynx is clear and moist and mucous membranes are normal. No uvula swelling. No oropharyngeal exudate.  Eyes: Conjunctivae and EOM are normal. Right eye exhibits no discharge. Left eye exhibits no discharge. No scleral icterus.  Neck: Normal range of motion. Neck supple.  Cardiovascular: Normal rate, regular rhythm and normal heart sounds.   Pulmonary/Chest: Effort normal and breath sounds normal. No stridor. No respiratory distress. She has no wheezes. She exhibits no tenderness.  Abdominal: Soft. There is no tenderness.  Musculoskeletal: Normal range of  motion.  Neurological: She is alert and oriented to person, place, and time.  Skin: Skin is warm and dry. No rash noted. She is not diaphoretic.  Psychiatric: She has a normal mood and affect. Her behavior is normal.    ED Course  Procedures (including critical care time)  Labs Reviewed - No data to display Dg Chest 2 View  10/03/2011  *RADIOLOGY REPORT*  Clinical Data: Cough, congestion  CHEST -  2 VIEW  Comparison: 09/20/2011  Findings: Cardiomediastinal silhouette is within normal limits. The lungs are clear. No pleural effusion.  No pneumothorax.  No acute osseous abnormality.  IMPRESSION: Normal chest.  Original Report Authenticated By: Harrel Lemon, M.D.     No diagnosis found.    MDM  linguring cough.  Due to patient's normal chest x-ray and oxygen saturation 100% on room air patient will be discharged with a different cough suppressant and she was previously prescribed.      Jaci Carrel, [place] 10/03/11 1446

## 2011-10-04 ENCOUNTER — Ambulatory Visit: Payer: BC Managed Care – PPO

## 2011-10-08 ENCOUNTER — Ambulatory Visit
Admission: RE | Admit: 2011-10-08 | Discharge: 2011-10-08 | Disposition: A | Discharge status: Home / Self Care | Payer: BC Managed Care – PPO | Source: Ambulatory Visit | Attending: Obstetrics and Gynecology | Admitting: Obstetrics and Gynecology

## 2011-10-08 DIAGNOSIS — Z1231 Encounter for screening mammogram for malignant neoplasm of breast: Secondary | ICD-10-CM

## 2011-10-10 ENCOUNTER — Encounter (HOSPITAL_COMMUNITY): Payer: Self-pay | Admitting: *Deleted

## 2011-10-10 ENCOUNTER — Emergency Department (HOSPITAL_COMMUNITY)
Admission: EM | Admit: 2011-10-10 | Discharge: 2011-10-10 | Disposition: A | Discharge status: Home / Self Care | Payer: BC Managed Care – PPO | Source: Home / Self Care | Attending: Emergency Medicine | Admitting: Emergency Medicine

## 2011-10-10 DIAGNOSIS — R05 Cough: Secondary | ICD-10-CM

## 2011-10-10 DIAGNOSIS — R059 Cough, unspecified: Secondary | ICD-10-CM

## 2011-10-10 HISTORY — DX: Tachycardia, unspecified: R00.0

## 2011-10-10 MED ORDER — SUCRALFATE 1 GM/10ML PO SUSP: 1.0000 g | Freq: Four times a day (QID) | ORAL | Status: AC

## 2011-10-10 MED ORDER — TRAMADOL HCL 50 MG PO TABS: 100.0000 mg | ORAL_TABLET | Freq: Three times a day (TID) | ORAL | Status: DC | PRN

## 2011-10-10 MED ORDER — AMOXICILLIN-POT CLAVULANATE 875-125 MG PO TABS: 1.0000 | ORAL_TABLET | Freq: Two times a day (BID) | ORAL | Status: AC

## 2011-10-10 MED ORDER — FLUTICASONE PROPIONATE 50 MCG/ACT NA SUSP: 2.0000 | Freq: Every day | NASAL | Status: DC

## 2011-10-10 NOTE — ED Provider Notes (Signed)
Chief Complaint  Patient presents with  . Cough  . Sore Throat    History of Present Illness:  [name] has a three-week history of a cough. This initially began when she got choked on some orange juice she was drinking before bedtime. It's persisted for the entire 3 weeks. It's been productive of yellow-green sputum with occasional blood. She also has sore throat, irritated throat, nasal congestion, dry nose, bloody nasal drainage, chills, and she had been somewhat short of breath.  She saw her cardiologist when this first began and had an x-ray and was given a Z-Pak. She felt somewhat better but did not improve. She's been to the emergency room twice, the first time she had an EKG and the second time she had another chest x-ray. She was given some Hycodan cough syrup and Tessalon Perles for the cough but no further antibiotics.  She remains symptomatic. She has an appointment with a gastroenterologist in 3 days. He plans to see Dr. Leone Payor. She denies any dysphagia, indigestion, heartburn, or water brash. She does not now have any chest pain or shortness of breath.  Review of Systems:  Other than noted above, the patient denies any of the following symptoms. Systemic:  No fever, chills, sweats, fatigue, myalgias, headache, or anorexia. Eye:  No redness, pain or drainage. ENT:  No earache, nasal congestion, rhinorrhea, sinus pressure, or sore throat. Lungs:  No cough, sputum production, wheezing, shortness of breath. Or chest pain. GI:  No nausea, vomiting, abdominal pain or diarrhea. Skin:  No rash or itching.  PMFSH:  Past medical history, family history, social history, meds, and allergies were reviewed.  Physical Exam:   Vital signs:  BP 120/70  Pulse 70  Temp(Src) 97.6 F (36.4 C) (Oral)  Resp 20  SpO2 98% General:  Alert, in no distress. Eye:  No conjunctival injection or drainage. ENT:  TMs and canals were normal, without erythema or inflammation.  Nasal mucosa was clear and  uncongested, without drainage.  Mucous membranes were moist.  Pharynx was clear, without exudate or drainage.  There were no oral ulcerations or lesions. Neck:  Supple, no adenopathy, tenderness or mass. Lungs:  No respiratory distress.  Lungs were clear to auscultation, without wheezes, rales or rhonchi.  Breath sounds were clear and equal bilaterally. Heart:  Regular rhythm, without gallops, murmers or rubs. Skin:  Clear, warm, and dry, without rash or lesions.  Labs:      Radiology:  Dg Neck Soft Tissue  09/20/2011  *RADIOLOGY REPORT*  Clinical Data: Choking episode with cough and sore throat.  Has a sensation of something feeling stuck in the throat.  NECK SOFT TISSUES - 1+ VIEW  Comparison: None.  Findings: No radiopaque foreign body.  Prevertebral soft tissues are normal. Mild degenerative changes are seen in the cervical spine.  IMPRESSION: No acute findings.  Mild cervical spondylosis.  Original Report Authenticated By: Reyes Ivan, M.D.   Dg Chest 2 View  10/03/2011  *RADIOLOGY REPORT*  Clinical Data: Cough, congestion  CHEST - 2 VIEW  Comparison: 09/20/2011  Findings: Cardiomediastinal silhouette is within normal limits. The lungs are clear. No pleural effusion.  No pneumothorax.  No acute osseous abnormality.  IMPRESSION: Normal chest.  Original Report Authenticated By: Harrel Lemon, M.D.   Dg Chest 2 View  09/20/2011  *RADIOLOGY REPORT*  Clinical Data: Bronchitis.  Cough after choking episode.  CHEST - 2 VIEW  Comparison: 08/27/2010.  Findings: Trachea is midline.  Heart size normal.  Lungs are clear. No pleural fluid.  IMPRESSION: No acute findings.  Original Report Authenticated By: Reyes Ivan, M.D.    Medications given in [facility]:  None  Assessment:   Diagnoses that have been ruled out:  None  Diagnoses that are still under consideration:  None  Final diagnoses:  Cough - I think this is most likely due to acid reflux and I think is an excellent idea for her to  see a gastroenterologist.       Plan:   1.  The following meds were prescribed:   New Prescriptions   AMOXICILLIN-CLAVULANATE (AUGMENTIN) 875-125 MG PER TABLET    Take 1 tablet by mouth 2 (two) times daily.   FLUTICASONE (FLONASE) 50 MCG/ACT NASAL SPRAY    Place 2 sprays into the nose daily.   SUCRALFATE (CARAFATE) 1 GM/10ML SUSPENSION    Take 10 mLs (1 g total) by mouth 4 (four) times daily.   TRAMADOL (ULTRAM) 50 MG TABLET    Take 2 tablets (100 mg total) by mouth every 8 (eight) hours as needed for pain.   2.  The patient was instructed in symptomatic care and handouts were given. 3.  The patient was told to return if becoming worse in any way, if no better in 3 or 4 days, and given some red flag symptoms that would indicate earlier return. I encouraged her to followup with the gastroenterologist on Tuesday.   Roque Lias, MD 10/10/11 1126

## 2011-10-10 NOTE — ED Notes (Signed)
Pt with c/o cough/congestion/sorethroat - x 2 weeks  treated with z pack now continues with cough/pain throat - now with blood sputum - per pt drinking orange juice x 2 weeks ago choked onset of symptoms - pt has appt with GI Tuesday -

## 2011-10-11 ENCOUNTER — Encounter: Payer: Self-pay | Admitting: Internal Medicine

## 2011-10-12 ENCOUNTER — Encounter: Payer: Self-pay | Admitting: Internal Medicine

## 2011-10-12 ENCOUNTER — Ambulatory Visit (INDEPENDENT_AMBULATORY_CARE_PROVIDER_SITE_OTHER): Payer: BC Managed Care – PPO | Admitting: Internal Medicine

## 2011-10-12 DIAGNOSIS — R1310 Dysphagia, unspecified: Secondary | ICD-10-CM

## 2011-10-12 DIAGNOSIS — K219 Gastro-esophageal reflux disease without esophagitis: Secondary | ICD-10-CM

## 2011-10-12 DIAGNOSIS — K5900 Constipation, unspecified: Secondary | ICD-10-CM

## 2011-10-12 MED ORDER — OMEPRAZOLE 40 MG PO CPDR: 40.0000 mg | DELAYED_RELEASE_CAPSULE | Freq: Every day | ORAL | Status: DC

## 2011-10-12 NOTE — Patient Instructions (Addendum)
You have been scheduled for an endoscopy. Please follow written instructions given to you at your visit today.  We have sent the following medications to your pharmacy for you to pick up at your convenience:Protonix  Dr. Rhea Belton would like you to hold your augmentin

## 2011-10-12 NOTE — Progress Notes (Signed)
Subjective:    Patient ID: [name], female    DOB: [birth year], 51 y.o.   MRN: [id number]  HPI [name] is a 51 yo female with PMH of mitral valve prolapse, GERD, constipation, who is seen for evaluation of GERD and dysphagia.  The patient reports over the last 3-4 weeks she's been dealing with worsening heartburn and reflux symptoms along with cough and globus sensation. She reports 3 weeks ago having an aspiration type event after drinking orange juice. Since this time she reports ongoing cough and a mild sore throat. She has been evaluated by chest x-ray for pneumonia and treated empirically with a Z-Pak for her cough. Prior to azithromycin therapy she was reporting productive cough with greenish yellow sputum. Her sputum production has resolved but she still has a dry irritating cough. She also continues to have globus sensation and occasionally she feels like she has a hard time swallowing solid food. She continues to have occasional heartburn with some chest pain. She recently restarted Protonix but has been taking this and consistently. She has used this in years past with good benefit. She does endorse water brash. Spicy food makes her symptoms worse. She denies nausea or vomiting. She denies weight loss. She denies hematemesis, melena or bright rectal blood per rectum. She does endorse chronic ongoing constipation which she has tried to treat by adjusting her diet. She denies trouble with bloating. She was recently seen by urgent care and given a prescription for Augmentin, but she did not start this as she was waiting on her appointment with me. No fevers or chills.  Review of Systems Constitutional: Negative for fever, chills, night sweats, activity change, appetite change and unexpected weight change HEENT: See history of present illness Eyes: Negative for visual disturbance Respiratory: Positive for cough, negative for chest tightness and shortness of breath Cardiovascular: Negative  for chest pain that she associates with reflux, negative for palpitations and lower extremity swelling Gastrointestinal: See history of present illness Genitourinary: Negative for dysuria and hematuria. Musculoskeletal: Negative for back pain, arthralgias and myalgias Skin: Negative for rash or color change Neurological: Negative for headaches, weakness, numbness Hematological: Negative for adenopathy, negative for easy bruising/bleeding Psychiatric/behavioral: Negative for depressed mood, negative for anxiety  Patient Active Problem List  Diagnoses  . GERD  . DYSPEPSIA  . CONSTIPATION  . IRRITABLE BOWEL SYNDROME  . SLEEP APNEA  - mitral valve prolapse  Past Surgical History  Procedure Date  . Novasure ablation   . Cardiac catheterization    Current Outpatient Prescriptions  Medication Sig Dispense Refill  . atenolol (TENORMIN) 25 MG tablet Take 25 mg by mouth daily.      . cholecalciferol (VITAMIN D) 1000 UNITS tablet Take 1,000 Units by mouth daily.      . fluticasone (FLONASE) 50 MCG/ACT nasal spray Place 2 sprays into the nose daily.  16 g  0  . OVER THE COUNTER MEDICATION OTC iron supp. prn      . PARoxetine (PAXIL-CR) 25 MG 24 hr tablet Take 25 mg by mouth every morning.      . sucralfate (CARAFATE) 1 GM/10ML suspension Take 10 mLs (1 g total) by mouth 4 (four) times daily.  420 mL  0  . amoxicillin-clavulanate (AUGMENTIN) 875-125 MG per tablet Take 1 tablet by mouth 2 (two) times daily.  20 tablet  0  . omeprazole (PRILOSEC) 40 MG capsule Take 1 capsule (40 mg total) by mouth daily.  30 capsule  5   Allergies  Allergen Reactions  . Ceftriaxone Sodium Itching  . Doxycycline Rash   Family History  Problem Relation Age of Onset  . Colon cancer Father   . Hypertension Maternal Grandfather   . Diabetes Maternal Grandfather   . Diabetes Maternal Grandmother   . Heart disease Maternal Grandfather    Social History  . Marital Status: Divorced    Number of Children: 2     Occupational History  . dialysis tech    Social History Main Topics  . Smoking status: Never Smoker   . Smokeless tobacco: Never Used  . Alcohol Use: No  . Drug Use: No      Objective:   Physical Exam BP 110/70  Ht 5\' 6"  (1.676 m)  Wt 204 lb 6.4 oz (92.715 kg)  BMI 32.99 kg/m2 Constitutional: Well-developed and well-nourished. No distress. HEENT: Normocephalic and atraumatic. Oropharynx is clear and moist. No oropharyngeal exudate. Conjunctivae are normal. Pupils are equal round and reactive to light. No scleral icterus. Neck: Neck supple. Trachea midline. Cardiovascular: Normal rate, regular rhythm and intact distal pulses. No M/R/G Pulmonary/chest: Effort normal and breath sounds normal. No wheezing, rales or rhonchi. Abdominal: Soft, nontender, nondistended. Bowel sounds active throughout. There are no masses palpable. No hepatosplenomegaly. Extremities: no clubbing, cyanosis, or edema Lymphadenopathy: No cervical adenopathy noted. Neurological: Alert and oriented to person place and time. Skin: Skin is warm and dry. No rashes noted. Psychiatric: Normal mood and affect. Behavior is normal.  The patient reportedly had previous EGD and colonoscopy at [facility]. These records are not available for review today but have been requested    Assessment & Plan:   51 yo female with PMH of mitral valve prolapse, GERD, constipation, who is seen for evaluation of GERD and dysphagia  1. GERD -- the patient's symptoms are consistent with acid reflux disease and at present I feel that she is having more globus sensation than true dysphagia.  She has been inconsistent with PPI therapy, and I would like for her to start taking pantoprazole 40 mg daily. We discussed how best to take this, specifically 30 minutes to one hour prior to her first meal of the day.  I will refill this prescription today.  I also have recommended upper endoscopy to rule out erosive esophagitis, ulcer  disease, or any structural abnormality such as stricture.  We discussed this test and she is agreeable to proceed. I do not think based on her current symptoms and negative recent chest x-ray, that her cough is secondary to infection such as pneumonia.  At this point, I feel that she would likely not benefit greatly from the Augmentin therapy. She will likely hold this medication, but I have asked that if her symptoms worsen or she develops fever, productive cough, that she notify me and likely will begin taking that medication.  2. Constipation -- the patient has had previous screening colonoscopy, and I'm waiting for these results. Her constipation is somewhat long-standing, and I recommended Colace 100 mg daily as a stool softener. If this is not sufficient to relieve her constipation, I recommend MiraLAX 17 g daily. She can titrate this as needed for constipation. I've also encouraged her to drink plenty of fluids each day and also attempt a high-fiber diet. Repeat colonoscopy interval to be determined based on review of her previous colonoscopy.

## 2011-11-01 ENCOUNTER — Telehealth: Payer: Self-pay | Admitting: Internal Medicine

## 2011-11-01 NOTE — Telephone Encounter (Signed)
No charge. 

## 2011-11-02 ENCOUNTER — Other Ambulatory Visit: Payer: BC Managed Care – PPO | Admitting: Internal Medicine

## 2011-11-08 ENCOUNTER — Ambulatory Visit (AMBULATORY_SURGERY_CENTER): Payer: BC Managed Care – PPO | Admitting: Internal Medicine

## 2011-11-08 ENCOUNTER — Encounter: Payer: Self-pay | Admitting: Internal Medicine

## 2011-11-08 DIAGNOSIS — R1310 Dysphagia, unspecified: Secondary | ICD-10-CM

## 2011-11-08 DIAGNOSIS — K219 Gastro-esophageal reflux disease without esophagitis: Secondary | ICD-10-CM

## 2011-11-08 MED ORDER — SODIUM CHLORIDE 0.9 % IV SOLN: 500.0000 mL | INTRAVENOUS | Status: DC

## 2011-11-08 NOTE — Patient Instructions (Signed)
Discharge instructions given with verbal understanding. Normal exam. Resume previous medications. YOU HAD AN ENDOSCOPIC PROCEDURE TODAY AT THE [facility]: Refer to the procedure report that was given to you for any specific questions about what was found during the examination.  If the procedure report does not answer your questions, please call your gastroenterologist to clarify.  If you requested that your care partner not be given the details of your procedure findings, then the procedure report has been included in a sealed envelope for you to review at your convenience later.  YOU SHOULD EXPECT: Some feelings of bloating in the abdomen. Passage of more gas than usual.  Walking can help get rid of the air that was put into your GI tract during the procedure and reduce the bloating. If you had a lower endoscopy (such as a colonoscopy or flexible sigmoidoscopy) you may notice spotting of blood in your stool or on the toilet paper. If you underwent a bowel prep for your procedure, then you may not have a normal bowel movement for a few days.  DIET: Your first meal following the procedure should be a light meal and then it is ok to progress to your normal diet.  A half-sandwich or bowl of soup is an example of a good first meal.  Heavy or fried foods are harder to digest and may make you feel nauseous or bloated.  Likewise meals heavy in dairy and vegetables can cause extra gas to form and this can also increase the bloating.  Drink plenty of fluids but you should avoid alcoholic beverages for 24 hours.  ACTIVITY: Your care partner should take you home directly after the procedure.  You should plan to take it easy, moving slowly for the rest of the day.  You can resume normal activity the day after the procedure however you should NOT DRIVE or use heavy machinery for 24 hours (because of the sedation medicines used during the test).    SYMPTOMS TO REPORT IMMEDIATELY: A gastroenterologist  can be reached at any hour.  During normal business hours, 8:30 AM to 5:00 PM Monday through Friday, call [phone].  After hours and on weekends, please call the GI answering service at [phone] who will take a message and have the physician on call contact you.   Following upper endoscopy (EGD)  Vomiting of blood or coffee ground material  New chest pain or pain under the shoulder blades  Painful or persistently difficult swallowing  New shortness of breath  Fever of 100F or higher  Black, tarry-looking stools  FOLLOW UP: If any biopsies were taken you will be contacted by phone or by letter within the next 1-3 weeks.  Call your gastroenterologist if you have not heard about the biopsies in 3 weeks.  Our staff will call the home number listed on your records the next business day following your procedure to check on you and address any questions or concerns that you may have at that time regarding the information given to you following your procedure. This is a courtesy call and so if there is no answer at the home number and we have not heard from you through the emergency physician on call, we will assume that you have returned to your regular daily activities without incident.  SIGNATURES/CONFIDENTIALITY: You and/or your care partner have signed paperwork which will be entered into your electronic medical record.  These signatures attest to the fact that that the information above on your After   Visit Summary has been reviewed and is understood.  Full responsibility of the confidentiality of this discharge information lies with you and/or your care-partner. 

## 2011-11-08 NOTE — Op Note (Signed)
[facility] [address] [place], [place]  [address]  ENDOSCOPY PROCEDURE REPORT  PATIENT:  [name], [name]  MR#:  [id number] BIRTHDATE:  [birth year], 50 yrs. old  GENDER:  female ENDOSCOPIST:  Carie Caddy. Mignon Bechler, MD Referred by:  Orpah Cobb, M.D. PROCEDURE DATE:  11/08/2011 PROCEDURE:  EGD, diagnostic 43235 ASA CLASS:  Class I INDICATIONS:  GERD, dysphagia (now improved on PPI) MEDICATIONS:   These medications were titrated to patient response per physician's verbal order, Versed 3 mg IV, Fentanyl 50 mcg IV TOPICAL ANESTHETIC:  Cetacaine Spray  DESCRIPTION OF PROCEDURE:   After the risks benefits and alternatives of the procedure were thoroughly explained, informed consent was obtained.  The LB [id number] endoscope was introduced through the mouth and advanced to the second portion of the duodenum, without limitations.  The instrument was slowly withdrawn as the mucosa was fully examined. <<PROCEDUREIMAGES>>  The esophagus and gastroesophageal junction were completely normal in appearance.  The stomach was entered and closely examined. The antrum, angularis, and lesser curvature were well visualized, including a retroflexed view of the cardia and fundus. The stomach wall was normally distensable. The scope passed easily through the pylorus into the duodenum.  The duodenal bulb was normal in appearance, as was the postbulbar duodenum.    Retroflexed views revealed no abnormalities.    The scope was then withdrawn from the patient and the procedure completed.  COMPLICATIONS:  None  ENDOSCOPIC IMPRESSION: 1) Normal esophagus 2) Normal stomach 3) Normal duodenum RECOMMENDATIONS: 1) Continue current medications, including omeprazole 40 mg daily. 2) Office follow-up in about 3 months.  Carie Caddy. Rhea Belton, MD  CC:  Orpah Cobb, MD The Patient  n. eSIGNEDCarie Caddy. Andyn Sales at 11/08/2011 11:41 AM  Ladona Ridgel, [phone]

## 2011-11-08 NOTE — Progress Notes (Signed)
Patient did not experience any of the following events: a burn prior to discharge; a fall within the facility; wrong site/side/patient/procedure/implant event; or a hospital transfer or hospital admission upon discharge from the facility. (G8907) Patient did not have preoperative order for IV antibiotic SSI prophylaxis. (G8918)  

## 2011-11-09 ENCOUNTER — Telehealth: Payer: Self-pay | Admitting: *Deleted

## 2011-11-09 NOTE — Telephone Encounter (Signed)
  Follow up Call-  Call back number 11/08/2011  Post procedure Call Back phone  # [phone]  Permission to leave phone message Yes     Patient questions:  Do you have a fever, pain , or abdominal swelling? no Pain Score  0 *  Have you tolerated food without any problems? yes  Have you been able to return to your normal activities? yes  Do you have any questions about your discharge instructions: Diet   no Medications  no Follow up visit  no  Do you have questions or concerns about your Care? no  Actions: * If pain score is 4 or above: No action needed, pain <4.

## 2012-01-03 ENCOUNTER — Telehealth: Payer: Self-pay | Admitting: *Deleted

## 2012-01-03 NOTE — Telephone Encounter (Signed)
Message copied by Florene Glen on Mon Jan 03, 2012  9:50 AM ------      Message from: Florene Glen      Created: Mon Dec 06, 2011  9:01 AM       Pt needs 3 month f/u from 11/08/11 EGD

## 2012-01-03 NOTE — Telephone Encounter (Signed)
lmom for pt to call back

## 2012-01-05 NOTE — Telephone Encounter (Signed)
Pt returned my call and left a message and then I left a message for her.

## 2012-01-05 NOTE — Telephone Encounter (Signed)
Informed pt that Dr Rhea Belton would like a f/u with her to assess her following February, 2013 EGD; pt stated understanding and f/u visit is June 6,2013.

## 2012-01-12 HISTORY — PX: UPPER GASTROINTESTINAL ENDOSCOPY: SHX188

## 2012-01-15 ENCOUNTER — Emergency Department (HOSPITAL_COMMUNITY)
Admission: EM | Admit: 2012-01-15 | Discharge: 2012-01-15 | Disposition: A | Discharge status: Home / Self Care | Payer: BC Managed Care – PPO | Source: Home / Self Care | Attending: Family Medicine | Admitting: Family Medicine

## 2012-01-15 ENCOUNTER — Encounter (HOSPITAL_COMMUNITY): Payer: Self-pay | Admitting: *Deleted

## 2012-01-15 DIAGNOSIS — R51 Headache: Secondary | ICD-10-CM

## 2012-01-15 NOTE — ED Notes (Signed)
Per pt hit head  4/30 on hood of vehicle  left top of head sore - no loss of conciousness - no treatment at time of injury - onset of headache today and intermittent  sharp pain right side of head - recent sinus congestion - denies n/v or other symptoms

## 2012-01-15 NOTE — Discharge Instructions (Signed)
Your neurological exam was reassuring today and not suspicious for any acute neurological finding. Your headache is likely related to your recent head injury (striking your head). In the absence of other sinister symptoms, for which you should continue to monitor, such as worsening headache, vision changes, nausea, persistent vomiting, increased sleepiness, confusion, numbness, tingling, or weakness, your symptoms should continue to improve. Should you experience any of the above symptoms, please return to the Emergency Department immediately. You may use ibuprofen (Advil, Motrin) and/or acetaminophen (Tylenol) as needed for pain relief.
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
## 2012-01-24 ENCOUNTER — Emergency Department (INDEPENDENT_AMBULATORY_CARE_PROVIDER_SITE_OTHER): Payer: BC Managed Care – PPO

## 2012-01-24 ENCOUNTER — Emergency Department (HOSPITAL_COMMUNITY)
Admission: EM | Admit: 2012-01-24 | Discharge: 2012-01-24 | Disposition: A | Discharge status: Home / Self Care | Payer: BC Managed Care – PPO | Source: Home / Self Care | Attending: Family Medicine | Admitting: Family Medicine

## 2012-01-24 ENCOUNTER — Encounter (HOSPITAL_COMMUNITY): Payer: Self-pay | Admitting: *Deleted

## 2012-01-24 DIAGNOSIS — R0789 Other chest pain: Secondary | ICD-10-CM

## 2012-01-24 DIAGNOSIS — R5383 Other fatigue: Secondary | ICD-10-CM

## 2012-01-24 DIAGNOSIS — R5381 Other malaise: Secondary | ICD-10-CM

## 2012-01-24 DIAGNOSIS — R071 Chest pain on breathing: Secondary | ICD-10-CM

## 2012-01-24 LAB — POCT I-STAT, CHEM 8
Chloride: 105 mEq/L (ref 96–112)
Creatinine, Ser: 0.8 mg/dL (ref 0.50–1.10)
Glucose, Bld: 78 mg/dL (ref 70–99)
Hemoglobin: 15.3 g/dL — ABNORMAL HIGH (ref 12.0–15.0)
Potassium: 3.8 mEq/L (ref 3.5–5.1)

## 2012-01-24 MED ORDER — CYCLOBENZAPRINE HCL 10 MG PO TABS: 10.0000 mg | ORAL_TABLET | Freq: Three times a day (TID) | ORAL | Status: AC | PRN

## 2012-01-24 MED ORDER — IBUPROFEN 400 MG PO TABS: 400.0000 mg | ORAL_TABLET | Freq: Four times a day (QID) | ORAL | Status: DC | PRN

## 2012-01-24 MED ORDER — ACETAMINOPHEN-CODEINE #3 300-30 MG PO TABS: 1.0000 | ORAL_TABLET | Freq: Four times a day (QID) | ORAL | Status: AC | PRN

## 2012-01-24 NOTE — ED Provider Notes (Signed)
History     CSN: [id number]  Arrival date & time 01/24/12  1623   First MD Initiated Contact with Patient 01/24/12 1713      Chief Complaint  Patient presents with  . Fatigue  . Chest Pain  . Back Pain    (Consider location/radiation/quality/duration/timing/severity/associated sxs/prior treatment) HPI Comments: 51 year old female with past medical history significant for mitral valve prolapse and on beta blocker for chronic tachycardia. Poor inconsistent historian. Comes with multiple complaints like decreased energy and fatigue for 1 week associated with pain in the anterior right side of her chest, she describes as "pulling or a catch sensation " worse with movement, deep breath or touch. 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 She also complains of a right lower back pain worse with movement like bending.  Denies chest pain or shortness of breath here. States that she also has been on a new diet to lose weight during the last week. Her cardiologist is Dr. Algie Coffer and states she had a stress test that was normal last year. No chest pain with walking or excertion. No diaphoreses, no nausea or vomiting. No leg swelling or PND.     Past Medical History  Diagnosis Date  . Mitral valve prolapse   . Rapid heart beat     States "irreg heart beat"  . Hemorrhoids   . Anemia     Past Surgical History  Procedure Date  . Novasure ablation   . Cardiac catheterization     Family History  Problem Relation Age of Onset  . Colon cancer Father   . Hypertension Maternal Grandfather   . Diabetes Maternal Grandfather   . Diabetes Maternal Grandmother   . Heart disease Maternal Grandfather     History  Substance Use Topics  . Smoking status: Never Smoker   . Smokeless tobacco: Never Used  . Alcohol Use: No    OB History    Grav Para Term Preterm Abortions TAB SAB Ect Mult Living                  Review of Systems  Constitutional:  Positive for fatigue. Negative for fever, chills, diaphoresis, activity change, appetite change and unexpected weight change.  HENT: Negative for congestion and rhinorrhea.   Respiratory: Positive for shortness of breath. Negative for cough, chest tightness and wheezing.   Cardiovascular: Positive for chest pain. Negative for palpitations and leg swelling.  Gastrointestinal: Negative for nausea, vomiting and abdominal pain.  Genitourinary: Negative for dysuria, urgency, frequency, hematuria, flank pain, vaginal bleeding, vaginal discharge and pelvic pain.  Musculoskeletal: Positive for back pain.  Skin: Negative for rash.  Neurological: Negative for dizziness, syncope, light-headedness and headaches.    Allergies  Ceftriaxone sodium and Doxycycline  Home Medications   Current Outpatient Rx  Name Route Sig Dispense Refill  . ATENOLOL 25 MG PO TABS Oral Take 25 mg by mouth daily.    [name] VITAMIN D 1000 UNITS PO TABS Oral Take 2,000 Units by mouth daily.     [name] OVER THE COUNTER MEDICATION  OTC iron supp. prn    . PAROXETINE HCL ER 25 MG PO TB24 Oral Take 25 mg by mouth every morning.    . ACETAMINOPHEN-CODEINE #3 300-30 MG PO TABS Oral Take 1-2 tablets by mouth every 6 (six) hours as needed for pain. 15 tablet 0  . CYCLOBENZAPRINE HCL 10 MG PO TABS Oral Take 1 tablet (10 mg total) by mouth 3 (  three) times daily as needed for muscle spasms. 15 tablet 0  . FLUTICASONE PROPIONATE 50 MCG/ACT NA SUSP Nasal Place 2 sprays into the nose daily. 16 g 0  . IBUPROFEN 400 MG PO TABS Oral Take 1 tablet (400 mg total) by mouth every 6 (six) hours as needed for pain. 20 tablet 0  . OMEPRAZOLE 40 MG PO CPDR Oral Take 1 capsule (40 mg total) by mouth daily. 30 capsule 5    BP 122/74  Pulse 64  Temp(Src) 98.5 F (36.9 C) (Oral)  Resp 18  SpO2 98%  LMP 01/20/2012  Physical Exam  Nursing note and vitals reviewed. Constitutional: She is oriented to person, place, and time. She appears well-developed and  well-nourished. No distress.  HENT:  Head: Normocephalic and atraumatic.  Mouth/Throat: Oropharynx is clear and moist. No oropharyngeal exudate.  Eyes: Conjunctivae are normal. Pupils are equal, round, and reactive to light. No scleral icterus.  Neck: Neck supple. No thyromegaly present.  Cardiovascular: Normal rate, regular rhythm and normal heart sounds.   Pulmonary/Chest: Effort normal and breath sounds normal. No respiratory distress. She has no wheezes. She has no rales.       Chest wall tenderness with palpation over right low parasternal border as per MS exam.  Abdominal: Soft. She exhibits no distension and no mass. There is no tenderness. There is no rebound and no guarding.  Musculoskeletal:       Tenderness to deep palpation over right lower parasternal area. Also diffuse right side para vertebral muscle tenderness to palpation at lumbosacral level.  Lymphadenopathy:    She has no cervical adenopathy.  Neurological: She is alert and oriented to person, place, and time.  Skin: No rash noted.    ED Course  Procedures (including critical care time)  Labs Reviewed  POCT I-STAT, CHEM 8 - Abnormal; Notable for the following:    Hemoglobin 15.3 (*)    All other components within normal limits   Dg Chest 2 View  01/24/2012  *RADIOLOGY REPORT*  Clinical Data: Shortness of breath and chest pain  CHEST - 2 VIEW  Comparison: 10/03/2011  Findings: Cardiomediastinal silhouette is within normal limits. The lungs are clear. No pleural effusion.  No pneumothorax.  No acute osseous abnormality.  IMPRESSION: Normal chest.  Original Report Authenticated By: Harrel Lemon, M.D.     1. Costochondral chest pain   2. Fatigue       MDM  EKG with rate 56 bpm, no acute ischemic changes. Unchanged from EKG 01/20013. Impress muscle skeletal chest pain, reproducible buy touch.  Normal chest x-ray, hemoglobin, electrolytes and glucose. Normal vital signs. Asked to avoid extreme dieting and  follow up with a PCP to work out fatigue and decreased energy symptoms.  Also asked to follow up with cardiologist patient treatment with betablocker could also contribute to fatigue. Red flags that should prompt her return to the ED also discussed.        Sharin Grave, MD 01/25/12 [phone]

## 2012-01-24 NOTE — ED Notes (Addendum)
C/O decreased energy, fatigue since Sat.  Started today with some severe SOB, but states it is much better now.  8 days ago started w/ chest pains that felt "like a catch" substernally radiating into right breast; now has a "soreness".  Also has right lower back soreness with certain movements.  Denies UTI sxs.  Admits to lifting a lot at job.  BBS clear.

## 2012-01-24 NOTE — Discharge Instructions (Signed)
Your chest X-ray is normal. Your EKG (electric heart activity) does not present acute changes and is basically unchanged when compared with prior EKG in january. Your basic blood work consistent mouth hemoglobin and electrolytes is normal as well. Is likely your right-sided chest and right-sided back discomfort is related to musculoskeletal strain in relation to you job duties. Avoid lifting heavy weight during the last week and take the prescribed medications as instructed. Be aware that Tylenol #3 and Flexeril can make you sleepy and drowsy and she should not drive after taking this medications. You need to go to the emergency department if persistent chest pain or recurrent shortness of breath. Is possible fatigue could be related to your recent new diet region. I recommend to avoid any sudden changes in your diet or starving as a means to lose weight. Avoid any diets below 2000 calories a day. There other causes of fatigue like thyroid disease, diabetes or underlying conditions that if your symptoms are persistent I recommend that you followup with a primary care provider to start a workup. Otherwise followup with your cardiologist to monitor your symptoms.

## 2012-02-15 ENCOUNTER — Encounter: Payer: Self-pay | Admitting: Internal Medicine

## 2012-02-21 ENCOUNTER — Ambulatory Visit: Payer: BC Managed Care – PPO | Admitting: Internal Medicine

## 2012-02-29 ENCOUNTER — Other Ambulatory Visit: Payer: Self-pay | Admitting: Cardiovascular Disease

## 2012-02-29 ENCOUNTER — Ambulatory Visit
Admission: RE | Admit: 2012-02-29 | Discharge: 2012-02-29 | Disposition: A | Discharge status: Home / Self Care | Payer: BC Managed Care – PPO | Source: Ambulatory Visit | Attending: Cardiovascular Disease | Admitting: Cardiovascular Disease

## 2012-02-29 DIAGNOSIS — R51 Headache: Secondary | ICD-10-CM

## 2012-03-09 ENCOUNTER — Encounter (HOSPITAL_COMMUNITY): Payer: Self-pay | Admitting: Emergency Medicine

## 2012-03-09 ENCOUNTER — Emergency Department (HOSPITAL_COMMUNITY): Payer: BC Managed Care – PPO

## 2012-03-09 ENCOUNTER — Observation Stay (HOSPITAL_COMMUNITY)
Admission: EM | Admit: 2012-03-09 | Discharge: 2012-03-10 | Disposition: A | Discharge status: Home / Self Care | Payer: BC Managed Care – PPO | Attending: Cardiovascular Disease | Admitting: Cardiovascular Disease

## 2012-03-09 DIAGNOSIS — I059 Rheumatic mitral valve disease, unspecified: Secondary | ICD-10-CM | POA: Insufficient documentation

## 2012-03-09 DIAGNOSIS — R000 Tachycardia, unspecified: Secondary | ICD-10-CM | POA: Diagnosis present

## 2012-03-09 DIAGNOSIS — IMO0001 Reserved for inherently not codable concepts without codable children: Secondary | ICD-10-CM

## 2012-03-09 DIAGNOSIS — R0602 Shortness of breath: Secondary | ICD-10-CM | POA: Insufficient documentation

## 2012-03-09 DIAGNOSIS — R0789 Other chest pain: Principal | ICD-10-CM | POA: Insufficient documentation

## 2012-03-09 DIAGNOSIS — F411 Generalized anxiety disorder: Secondary | ICD-10-CM | POA: Insufficient documentation

## 2012-03-09 DIAGNOSIS — R079 Chest pain, unspecified: Secondary | ICD-10-CM | POA: Diagnosis present

## 2012-03-09 DIAGNOSIS — I498 Other specified cardiac arrhythmias: Secondary | ICD-10-CM | POA: Insufficient documentation

## 2012-03-09 DIAGNOSIS — K219 Gastro-esophageal reflux disease without esophagitis: Secondary | ICD-10-CM | POA: Insufficient documentation

## 2012-03-09 HISTORY — DX: Reserved for inherently not codable concepts without codable children: IMO0001

## 2012-03-09 HISTORY — DX: Gastro-esophageal reflux disease without esophagitis: K21.9

## 2012-03-09 LAB — BASIC METABOLIC PANEL
BUN: 8 mg/dL (ref 6–23)
CO2: 23 mEq/L (ref 19–32)
Calcium: 8.9 mg/dL (ref 8.4–10.5)
Chloride: 105 mEq/L (ref 96–112)
Creatinine, Ser: 0.67 mg/dL (ref 0.50–1.10)
GFR calc Af Amer: >90 mL/min (ref >90)
GFR calc non Af Amer: >90 mL/min (ref >90)
Glucose, Bld: 107 mg/dL — ABNORMAL HIGH (ref 70–99)
Potassium: 3.5 mEq/L (ref 3.5–5.1)
Sodium: 139 mEq/L (ref 135–145)

## 2012-03-09 LAB — CBC
HCT: 38.3 % (ref 36.0–46.0)
Hemoglobin: 12.3 g/dL (ref 12.0–15.0)
MCH: 24.1 pg — ABNORMAL LOW (ref 26.0–34.0)
MCHC: 32.1 g/dL (ref 30.0–36.0)
MCV: 75.1 fL — ABNORMAL LOW (ref 78.0–100.0)
Platelets: 201 10*3/uL (ref 150–400)
RBC: 5.1 MIL/uL (ref 3.87–5.11)
RDW: 15 % (ref 11.5–15.5)
WBC: 7.2 10*3/uL (ref 4.0–10.5)

## 2012-03-09 LAB — TROPONIN I: Troponin I: <0.3 ng/mL (ref <0.30)

## 2012-03-09 MED ORDER — ONDANSETRON HCL 4 MG/2ML IJ SOLN
4.0000 mg | Freq: Once | INTRAMUSCULAR | Status: AC
  Administered 2012-03-09: 4 mg via INTRAVENOUS
  Filled 2012-03-09: qty 2

## 2012-03-09 MED ORDER — ASPIRIN 81 MG PO CHEW
324.0000 mg | CHEWABLE_TABLET | Freq: Once | ORAL | Status: AC
  Administered 2012-03-09: 324 mg via ORAL
  Filled 2012-03-09: qty 4

## 2012-03-09 NOTE — ED Notes (Signed)
Pt. Is from home who presented to Ed with complaint of chest pain at the scale of 6/10, with SOB, pt. Also claimed  Nausea w/ HA. Pt. Reported of being on maintenance of Atenolol for about 5 years for heart irregularities. A/o x3.

## 2012-03-09 NOTE — H&P (Signed)
[name] is an 51 y.o. female.   Chief Complaint: Chest pain HPI: 51 years old black female with 1 day history of under the left breast chest pain without radiation. + SOB and nausea. EKG showed Sinus tachycardia with non-specific ST-T changes.  Past Medical History  Diagnosis Date  . Mitral valve prolapse   . Rapid heart beat     States "irreg heart beat"  . Hemorrhoids   . Anemia   . GERD (gastroesophageal reflux disease)       Past Surgical History  Procedure Date  . Novasure ablation   . Cardiac catheterization   . Upper gastrointestinal endoscopy May 2013    Family History  Problem Relation Age of Onset  . Colon cancer Father   . Hypertension Maternal Grandfather   . Diabetes Maternal Grandfather   . Diabetes Maternal Grandmother   . Heart disease Maternal Grandfather    Social History:  reports that she has never smoked. She has never used smokeless tobacco. She reports that she does not drink alcohol or use illicit drugs.  Allergies:  Allergies  Allergen Reactions  . Ceftriaxone Sodium Itching  . Doxycycline Rash     (Not in a hospital admission)  Results for orders placed during the hospital encounter of 03/09/12 (from the past 48 hour(s))  CBC     Status: Abnormal   Collection Time   03/09/12  7:55 PM      Component Value Range Comment   WBC 7.2  4.0 - 10.5 K/uL    RBC 5.10  3.87 - 5.11 MIL/uL    Hemoglobin 12.3  12.0 - 15.0 g/dL    HCT 78.4  69.6 - 29.5 %    MCV 75.1 (*) 78.0 - 100.0 fL    MCH 24.1 (*) 26.0 - 34.0 pg    MCHC 32.1  30.0 - 36.0 g/dL    RDW 28.4  13.2 - 44.0 %    Platelets 201  150 - 400 K/uL   TROPONIN I     Status: Normal   Collection Time   03/09/12  7:55 PM      Component Value Range Comment   Troponin I <0.30  <0.30 ng/mL   BASIC METABOLIC PANEL     Status: Abnormal   Collection Time   03/09/12  7:55 PM      Component Value Range Comment   Sodium 139  135 - 145 mEq/L    Potassium 3.5  3.5 - 5.1 mEq/L    Chloride 105   96 - 112 mEq/L    CO2 23  19 - 32 mEq/L    Glucose, Bld 107 (*) 70 - 99 mg/dL    BUN 8  6 - 23 mg/dL    Creatinine, Ser 1.02  0.50 - 1.10 mg/dL    Calcium 8.9  8.4 - 72.5 mg/dL    GFR calc non Af Amer >90  >90 mL/min    GFR calc Af Amer >90  >90 mL/min    Dg Chest 2 View  03/09/2012  *RADIOLOGY REPORT*  Clinical Data: Chest pain, short of breath  CHEST - 2 VIEW  Comparison: Chest radiograph 01/24/2012  Findings: Normal cardiac silhouette.  No effusion, infiltrate, or pneumothorax. No acute osseous abnormality.  IMPRESSION: No acute cardiopulmonary process.  Original Report Authenticated By: Genevive Bi, M.D.    @ROS @ Positive for weight gain and positive for vision change with patient wearing reading glasses. No history of eye surgery, no history of rhinorrhea, positive  history of hearing loss in the right ear and positive history of tinnitus, no history of denture use, no history of cough, hemoptysis, asthma, COPD, pneumonia, positive history of dyspnea and palpitation, positive history of chest pain and leg edema, negative history of claudication, nausea, vomiting, or diarrhea, positive history of constipation, negative history of GI bleed, ulcer, hernia, hepatitis, blood transfusion, kidney stone, stroke, seizure, psychiatric admissions except a positive history of depression, no history of joint pain or skin rash.  PHYSICAL EXAMINATION: Blood pressure 151/72, pulse 103, temperature 98.6 F (37 C), temperature source Oral, resp. rate 20, height 5\' 6"  (1.676 m), last menstrual period 03/09/2012, SpO2 100.00%. GENERAL: Patient is alert and oriented x3.  HEAD: Normocephalic, atraumatic.  EYES: Manson Passey. Pupils equally reacting to light. Extraocular movement intact.  EARS, NOSE, THROAT: Mucous membranes pink and moist.  NECK: No JVD or carotid bruit.  LUNGS: Clear bilaterally.  HEART: Normal S1, S2.  ABDOMEN: Soft and nontender.  EXTREMITIES: No edema, cyanosis, clubbing.  CNS: Cranial  nerves grossly intact, and the patient has bilateral equal grips. SKIN: Warm and dry.  Assessment/Plan Chest pain Anxiety Sinus tachycardia  Plan: Nuclear stress test v/s C. Cath. R/O MI.  Nicandro Perrault S 03/09/2012, 9:42 PM

## 2012-03-09 NOTE — ED Provider Notes (Signed)
History    50yf with cp. Onset about 45 minutes pta. Awakened from nap with it this evening. Felt fine when went to bed. Hurt beneath L breast. Pressure. Gradually subsided over course of about 30 minutes. Associated with nausea and palpitations. Hx of MV prolapse. Pt denies hx of similar pain. No cough. No fever or chills. Denies trauma. No rash. Denies hx of blood clot.  CSN: [id number]  Arrival date & time 03/09/12  1918   First MD Initiated Contact with Patient 03/09/12 1926      Chief Complaint  Patient presents with  . Chest Pain    (Consider location/radiation/quality/duration/timing/severity/associated sxs/prior treatment) HPI  Past Medical History  Diagnosis Date  . Mitral valve prolapse   . Rapid heart beat     States "irreg heart beat"  . Hemorrhoids   . Anemia     Past Surgical History  Procedure Date  . Novasure ablation   . Cardiac catheterization     Family History  Problem Relation Age of Onset  . Colon cancer Father   . Hypertension Maternal Grandfather   . Diabetes Maternal Grandfather   . Diabetes Maternal Grandmother   . Heart disease Maternal Grandfather     History  Substance Use Topics  . Smoking status: Never Smoker   . Smokeless tobacco: Never Used  . Alcohol Use: No    OB History    Grav Para Term Preterm Abortions TAB SAB Ect Mult Living                  Review of Systems   Review of symptoms negative unless otherwise noted in HPI.   Allergies  Ceftriaxone sodium and Doxycycline  Home Medications   Current Outpatient Rx  Name Route Sig Dispense Refill  . ATENOLOL 25 MG PO TABS Oral Take 25 mg by mouth daily.    [name] CALCIUM CARBONATE-VITAMIN D 500-200 MG-UNIT PO TABS Oral Take 1 tablet by mouth daily.    [name] VITAMIN D 1000 UNITS PO TABS Oral Take 2,000 Units by mouth daily.     [name] FERROUS SULFATE 325 (65 FE) MG PO TABS Oral Take 325 mg by mouth daily with breakfast.    . OMEPRAZOLE 40 MG PO CPDR Oral Take 40 mg by mouth daily  as needed. Acid reflux    . PAROXETINE HCL ER 25 MG PO TB24 Oral Take 25 mg by mouth every morning.      BP 151/72  Pulse 103  Temp 98.6 F (37 C) (Oral)  Resp 20  Ht 5\' 6"  (1.676 m)  SpO2 100%  LMP 03/09/2012  Physical Exam  Nursing note and vitals reviewed. Constitutional: She appears well-developed and well-nourished.  HENT:  Head: Normocephalic and atraumatic.  Eyes: Conjunctivae are normal. Right eye exhibits no discharge. Left eye exhibits no discharge.  Neck: Neck supple.  Cardiovascular: Normal rate, regular rhythm and normal heart sounds.  Exam reveals no gallop and no friction rub.   No murmur heard. Pulmonary/Chest: Effort normal and breath sounds normal. No respiratory distress. She exhibits no tenderness.       Cp not reproducible  Abdominal: Soft. She exhibits no distension. There is no tenderness.  Musculoskeletal: She exhibits no edema and no tenderness.       Lower extremities symmetric as compared to each other. No calf tenderness. Negative Homan's. No palpable cords.   Neurological: She is alert.  Skin: Skin is warm. She is diaphoretic.  Psychiatric: She has a normal mood and affect.  Her behavior is normal. Thought content normal.    ED Course  Procedures (including critical care time)  Labs Reviewed  CBC - Abnormal; Notable for the following:    MCV 75.1 (*)     MCH 24.1 (*)     All other components within normal limits  BASIC METABOLIC PANEL - Abnormal; Notable for the following:    Glucose, Bld 107 (*)     All other components within normal limits  TROPONIN I   Dg Chest 2 View  03/09/2012  *RADIOLOGY REPORT*  Clinical Data: Chest pain, short of breath  CHEST - 2 VIEW  Comparison: Chest radiograph 01/24/2012  Findings: Normal cardiac silhouette.  No effusion, infiltrate, or pneumothorax. No acute osseous abnormality.  IMPRESSION: No acute cardiopulmonary process.  Original Report Authenticated By: Genevive Bi, M.D.    EKG:  Rhythm:sinus  tach Rate: 122 Axis: normal Intervals: normal ST segments: ST depression anteriorly and inferiorly    1. Chest pain       MDM  [name] with CP. Concerning for possible angina. EKG with ischemic changes and changes from prior. CXR clear. Trop normal. Admit for r/o and potential further eval.        Raeford Razor, MD 03/09/12 2356

## 2012-03-10 ENCOUNTER — Observation Stay (HOSPITAL_COMMUNITY)
Admit: 2012-03-10 | Discharge: 2012-03-10 | Disposition: A | Discharge status: Home / Self Care | Payer: BC Managed Care – PPO | Attending: Cardiovascular Disease | Admitting: Cardiovascular Disease

## 2012-03-10 ENCOUNTER — Observation Stay (HOSPITAL_COMMUNITY)
Admission: RE | Admit: 2012-03-10 | Discharge: 2012-03-10 | Disposition: A | Discharge status: Home / Self Care | Payer: BC Managed Care – PPO | Source: Ambulatory Visit | Attending: Cardiovascular Disease | Admitting: Cardiovascular Disease

## 2012-03-10 LAB — CBC
Hemoglobin: 11.9 g/dL — ABNORMAL LOW (ref 12.0–15.0)
MCH: 24.2 pg — ABNORMAL LOW (ref 26.0–34.0)
MCV: 75.2 fL — ABNORMAL LOW (ref 78.0–100.0)
Platelets: 187 10*3/uL (ref 150–400)
RBC: 4.91 MIL/uL (ref 3.87–5.11)
WBC: 7.5 10*3/uL (ref 4.0–10.5)

## 2012-03-10 LAB — PROTIME-INR: Prothrombin Time: 12.9 seconds (ref 11.6–15.2)

## 2012-03-10 LAB — CARDIAC PANEL(CRET KIN+CKTOT+MB+TROPI)
CK, MB: 2.8 ng/mL (ref 0.3–4.0)
CK, MB: 2.3 ng/mL (ref 0.3–4.0)
Relative Index: 1.8 (ref 0.0–2.5)
Relative Index: 1.6 (ref 0.0–2.5)
Total CK: 150 U/L (ref 7–177)
Total CK: 136 U/L (ref 7–177)
Total CK: 142 U/L (ref 7–177)
Troponin I: <0.3 ng/mL (ref <0.30)

## 2012-03-10 LAB — HEPARIN LEVEL (UNFRACTIONATED): Heparin Unfractionated: 0.48 IU/mL (ref 0.30–0.70)

## 2012-03-10 LAB — LIPID PANEL
Cholesterol: 115 mg/dL (ref 0–200)
Total CHOL/HDL Ratio: 2.3 RATIO

## 2012-03-10 LAB — BASIC METABOLIC PANEL
CO2: 25 mEq/L (ref 19–32)
Chloride: 107 mEq/L (ref 96–112)
Creatinine, Ser: 0.71 mg/dL (ref 0.50–1.10)
Glucose, Bld: 95 mg/dL (ref 70–99)

## 2012-03-10 MED ORDER — ASPIRIN 300 MG RE SUPP
300.0000 mg | RECTAL | Status: DC
  Filled 2012-03-10: qty 1

## 2012-03-10 MED ORDER — TECHNETIUM TC 99M TETROFOSMIN IV KIT: 30.0000 | PACK | Freq: Once | INTRAVENOUS | Status: DC | PRN

## 2012-03-10 MED ORDER — ALPRAZOLAM 0.25 MG PO TABS: 0.2500 mg | ORAL_TABLET | Freq: Two times a day (BID) | ORAL | Status: DC | PRN

## 2012-03-10 MED ORDER — ASPIRIN EC 81 MG PO TBEC: 81.0000 mg | DELAYED_RELEASE_TABLET | Freq: Every day | ORAL | Status: DC

## 2012-03-10 MED ORDER — METOPROLOL TARTRATE 50 MG PO TABS
50.0000 mg | ORAL_TABLET | Freq: Two times a day (BID) | ORAL | Status: DC
  Filled 2012-03-10 (×3): qty 1

## 2012-03-10 MED ORDER — SODIUM CHLORIDE 0.9 % IJ SOLN: 3.0000 mL | INTRAMUSCULAR | Status: DC | PRN

## 2012-03-10 MED ORDER — NITROGLYCERIN 0.4 MG SL SUBL: 0.4000 mg | SUBLINGUAL_TABLET | SUBLINGUAL | Status: DC | PRN

## 2012-03-10 MED ORDER — SODIUM CHLORIDE 0.9 % IJ SOLN: 3.0000 mL | Freq: Two times a day (BID) | INTRAMUSCULAR | Status: DC

## 2012-03-10 MED ORDER — TECHNETIUM TC 99M TETROFOSMIN IV KIT
10.0000 | PACK | Freq: Once | INTRAVENOUS | Status: AC | PRN
  Administered 2012-03-10: 10 via INTRAVENOUS

## 2012-03-10 MED ORDER — ACETAMINOPHEN 325 MG PO TABS: 650.0000 mg | ORAL_TABLET | ORAL | Status: DC | PRN

## 2012-03-10 MED ORDER — ONDANSETRON HCL 4 MG/2ML IJ SOLN: 4.0000 mg | Freq: Four times a day (QID) | INTRAMUSCULAR | Status: DC | PRN

## 2012-03-10 MED ORDER — ASPIRIN 81 MG PO CHEW
324.0000 mg | CHEWABLE_TABLET | ORAL | Status: DC
  Filled 2012-03-10: qty 4

## 2012-03-10 MED ORDER — SODIUM CHLORIDE 0.9 % IV SOLN: 250.0000 mL | INTRAVENOUS | Status: DC | PRN

## 2012-03-10 MED ORDER — HEPARIN (PORCINE) IN NACL 100-0.45 UNIT/ML-% IJ SOLN
1000.0000 [IU]/h | INTRAMUSCULAR | Status: DC
  Administered 2012-03-10: 1000 [IU]/h via INTRAVENOUS
  Filled 2012-03-10: qty 250

## 2012-03-10 MED ORDER — SIMVASTATIN 20 MG PO TABS
20.0000 mg | ORAL_TABLET | Freq: Every day | ORAL | Status: DC
  Administered 2012-03-10: 20 mg via ORAL
  Filled 2012-03-10 (×2): qty 1

## 2012-03-10 MED ORDER — HEPARIN BOLUS VIA INFUSION
4000.0000 [IU] | Freq: Once | INTRAVENOUS | Status: AC
  Administered 2012-03-10: 4000 [IU] via INTRAVENOUS
  Filled 2012-03-10: qty 4000

## 2012-03-10 NOTE — Progress Notes (Signed)
Heparin drip  Stopped @ 0730am

## 2012-03-10 NOTE — Progress Notes (Signed)
ANTICOAGULATION CONSULT NOTE - Initial Consult  Pharmacy Consult for heparin Indication: r/o MI  Allergies  Allergen Reactions  . Ceftriaxone Sodium Itching  . Doxycycline Rash    Patient Measurements: Height: 5\' 6"  (167.6 cm) Weight: 205 lb 0.4 oz (93 kg) IBW/kg (Calculated) : 59.3  Heparin Dosing Weight:   Vital Signs: Temp: 98.7 F (37.1 C) (06/28 0016) Temp src: Oral (06/28 0016) BP: 121/72 mmHg (06/28 0016) Pulse Rate: 93  (06/28 0016)  Labs:  Basename 03/10/12 0040 03/09/12 1955  HGB -- 12.3  HCT -- 38.3  PLT -- 201  APTT -- --  LABPROT -- --  INR -- --  HEPARINUNFRC -- --  CREATININE -- 0.67  CKTOTAL 150 --  CKMB 2.8 --  TROPONINI <0.30 <0.30    Estimated Creatinine Clearance: 94.5 ml/min (by C-G formula based on Cr of 0.67).   Medical History: Past Medical History  Diagnosis Date  . Mitral valve prolapse   . Rapid heart beat     States "irreg heart beat"  . Hemorrhoids   . Anemia   . GERD (gastroesophageal reflux disease)     Medications:  Infusions:    . heparin 1,000 Units/hr (03/10/12 0103)    Assessment: Patient with r/o MI.    Goal of Therapy:  Heparin level 0.3-0.7 units/ml Monitor platelets by anticoagulation protocol: Yes   Plan:  Heparin bolus 4000 units iv, then 1000 units/hr. Check heparin level at 0800 and then daily heparin level and CBC  Aleene Davidson Crowford 03/10/2012,6:24 AM

## 2012-03-10 NOTE — Progress Notes (Signed)
   CARE MANAGEMENT NOTE 03/10/2012  Patient:  [name], [name]   Account Number:  [id number]  Date Initiated:  03/10/2012  Documentation initiated by:  Jiles Crocker  Subjective/Objective Assessment:   ADMITTED TO OBSERVATION WITH CHEST PAIN     Action/Plan:   INDEPENDENT PRIOR TO ADMISSION   Anticipated DC Date:  03/11/2012   Anticipated DC Plan:  HOME/SELF CARE      DC Planning Services  CM consult              Status of service:  In process, will continue to follow Medicare Important Message given?  NA - LOS <3 / Initial given by admissions (If response is "NO", the following Medicare IM given date fields will be blank)  Per UR Regulation:  Reviewed for med. necessity/level of care/duration of stay  Comments:  03/10/2012- B Maida Widger RN, BSN, MHA

## 2012-03-10 NOTE — Discharge Summary (Signed)
Physician Discharge Summary  Patient ID: [name] MRN: [id number] DOB/AGE: 02/28/1961 51 y.o.  Admit date: 03/09/2012 Discharge date: 03/10/2012  Admission Diagnoses: Chest pain at rest Sinus tachycardia  Discharge Diagnoses:  Principal Problem:  *Chest pain at rest Active Problems:  Sinus tachycardia  Anxiety  Discharged Condition: good  Hospital Course: 51 years old black female with near normal coronaries 6 years ago had left under the breast chest pain with shortness of breath and nausea. Her cardiac enzymes were unremarkable and she had no ischemia on nuclear stress test. She was discharged home in stable condition with follow up by me in 2 weeks.  Consults: None  Significant Diagnostic Studies: labs: Normal with borderline low MCV on CBC.  EKG: Sinus tachycardia and non-specific ST-T changes.  Nuclear medicine: No reversible ischemia and EF 73 %.  Treatments: anticoagulation: heparin and Metoprolol.  Discharge Exam: Blood pressure 124/76, pulse 71, temperature 98.5 F (36.9 C), temperature source Oral, resp. rate 18, height 5\' 6"  (1.676 m), weight 93 kg (205 lb 0.4 oz), last menstrual period 03/09/2012, SpO2 99.00%. GENERAL: Patient is alert and oriented x3.  HEAD: Normocephalic, atraumatic.  EYES: Manson Passey. Pupils equally reacting to light. Extraocular movement intact.  EARS, NOSE, THROAT: Mucous membranes pink and moist.  NECK: No JVD or carotid bruit.  LUNGS: Clear bilaterally. Chest wall non-tender on palpation. HEART: Normal S1, S2.  ABDOMEN: Soft and nontender.  EXTREMITIES: No edema, cyanosis, clubbing.  CNS: Cranial nerves grossly intact, and the patient has bilateral equal grips.  SKIN: Warm and dry.   Disposition: 01-Home or Self Care   Medication List  As of 03/10/2012  6:16 PM   TAKE these medications         atenolol 25 MG tablet   Commonly known as: TENORMIN   Take 25 mg by mouth daily.      calcium-vitamin D 500-200 MG-UNIT per tablet   Commonly known as: OSCAL WITH D   Take 1 tablet by mouth daily.      cholecalciferol 1000 UNITS tablet   Commonly known as: VITAMIN D   Take 2,000 Units by mouth daily.      ferrous sulfate 325 (65 FE) MG tablet   Take 325 mg by mouth daily with breakfast.      omeprazole 40 MG capsule   Commonly known as: PRILOSEC   Take 40 mg by mouth daily as needed. Acid reflux      PARoxetine 25 MG 24 hr tablet   Commonly known as: PAXIL-CR   Take 25 mg by mouth every morning.             SignedRicki Rodriguez 03/10/2012, 6:16 PM

## 2012-03-10 NOTE — Progress Notes (Signed)
Discharge to home ambulatory, no complaints of any pain or discomfort. PIV removed no s/s of infiltration, no swelling noted. All discharged instructions  Done and given to patient, verbalized understanding.

## 2012-05-02 ENCOUNTER — Emergency Department (INDEPENDENT_AMBULATORY_CARE_PROVIDER_SITE_OTHER): Payer: BC Managed Care – PPO

## 2012-05-02 ENCOUNTER — Encounter (HOSPITAL_COMMUNITY): Payer: Self-pay | Admitting: *Deleted

## 2012-05-02 ENCOUNTER — Emergency Department (HOSPITAL_COMMUNITY)
Admission: EM | Admit: 2012-05-02 | Discharge: 2012-05-02 | Disposition: A | Discharge status: Home / Self Care | Payer: BC Managed Care – PPO | Source: Home / Self Care | Attending: Emergency Medicine | Admitting: Emergency Medicine

## 2012-05-02 DIAGNOSIS — M940 Chondrocostal junction syndrome [Tietze]: Secondary | ICD-10-CM

## 2012-05-02 MED ORDER — MELOXICAM 15 MG PO TABS: 15.0000 mg | ORAL_TABLET | Freq: Every day | ORAL | Status: DC

## 2012-05-02 MED ORDER — PREDNISONE 5 MG PO KIT: 1.0000 | PACK | Freq: Every day | ORAL | Status: DC

## 2012-05-02 NOTE — ED Provider Notes (Signed)
Chief Complaint  Patient presents with  . 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 This is worse with palpation and also with movement. It does not hurt with deep inspiration, coughing, or sneezing. She denies any trauma to the area. She's never had this before. She denies any fever, chills, coughing, wheezing, shortness of breath, dizziness, syncope, abdominal pain, nausea, or vomiting. Her back feels a little bit tired and she does have occasional palpitations. She had some chest pain in June which was different from this pain and was hospitalized for observation and a stress test all of which were normal.  Review of Systems:  Other than noted above, the patient denies any of the following symptoms. Systemic:  No fever, chills, sweats, or fatigue. ENT:  No nasal congestion, rhinorrhea, or sore throat. Pulmonary:  No cough, wheezing, shortness of breath, sputum production, hemoptysis. Cardiac:  No palpitations, rapid heartbeat, dizziness, presyncope or syncope. GI:  No abdominal pain, heartburn, nausea, or vomiting. Skin:  No rash or itching. Ext:  No leg pain or swelling.   PMFSH:  Past medical history, family history, social history, meds, and allergies were reviewed and updated as needed.  Physical Exam:   Vital signs:  BP 109/59  Pulse 66  Temp 98.4 F (36.9 C) (Oral)  Resp 18  SpO2 100%  LMP 04/20/2012 Gen:  Alert, oriented, in no distress, skin warm and dry. Eye:  PERRL, lids and conjunctivas normal.  Sclera non-icteric. ENT:  Mucous membranes moist, pharynx clear. Neck:  Supple, no adenopathy or tenderness.  No JVD. Lungs:  Clear to auscultation, no wheezes, rales or rhonchi.  No respiratory distress. Heart:  Regular rhythm.  No gallops, murmers, clicks or rubs. Chest:  She has localized tenderness to palpation over the left lower anterolateral rib cage area  at the costal margin. Abdomen:  Soft, nontender, no organomegaly or mass.  Bowel sounds normal.  No pulsatile abdominal mass or bruit. Ext:  No edema.  No calf tenderness and Homann's sign negative.  Pulses full and equal. Skin:  Warm and dry.  No rash.   Radiology:  Dg Ribs Unilateral W/chest Left  05/02/2012  *RADIOLOGY REPORT*  Clinical Data: Left flank pain.  No trauma.  LEFT RIBS AND CHEST - 3+ VIEW  Comparison: 03/09/2012  Findings: Cardiac and mediastinal contours are normal.  Negative for heart failure.  Negative for pneumonia or effusion.  No pneumothorax.  Left rib detail reveals no rib fracture or rib lesion.  IMPRESSION: Negative   Original Report Authenticated By: Camelia Phenes, M.D.    Assessment:  The encounter diagnosis was Costochondritis.  Plan:   1.  The following meds were prescribed:   New Prescriptions   MELOXICAM (MOBIC) 15 MG TABLET    Take 1 tablet (15 mg total) by mouth daily.   PREDNISONE 5 MG KIT    Take 1 kit (5 mg total) by mouth daily after breakfast. Prednisone 5 mg 6 day dosepack.  Take as directed.   2.  The patient was instructed in symptomatic care and handouts were given. 3.  The patient was told to return if becoming worse in any way, if no better in 3 or 4 days, and given some red flag symptoms that would indicate earlier return.    Reuben Likes, MD 05/02/12 [phone]

## 2012-05-02 NOTE — ED Notes (Signed)
Pt  Reports   l  Side   Flank  And  l  Rib  Cage    Pain    -m  Symptoms  X  3  Days  She  denys  Any nausea  Vomiting  Or  Diarrhea  She        Reports   Pain is  Worse  On certain movements  And  Or  posistion     She  Is  Sitting  Upright on  Exam table  Speaking in  Complete  sentances

## 2012-05-12 ENCOUNTER — Encounter: Payer: Self-pay | Admitting: Endocrinology

## 2012-05-12 ENCOUNTER — Ambulatory Visit (INDEPENDENT_AMBULATORY_CARE_PROVIDER_SITE_OTHER): Payer: BC Managed Care – PPO | Admitting: Endocrinology

## 2012-05-12 ENCOUNTER — Other Ambulatory Visit (INDEPENDENT_AMBULATORY_CARE_PROVIDER_SITE_OTHER): Payer: BC Managed Care – PPO

## 2012-05-12 VITALS — BP 112/70 | HR 77 | Temp 98.1°F | Ht 66.0 in | Wt 201.0 lb

## 2012-05-12 DIAGNOSIS — E042 Nontoxic multinodular goiter: Secondary | ICD-10-CM

## 2012-05-12 LAB — TSH: TSH: 0.58 u[IU]/mL (ref 0.35–5.50)

## 2012-05-12 NOTE — Patient Instructions (Addendum)
Let's recheck the thyroid ultrasound.  you will receive a phone call, about a day and time for an appointment.   blood tests are being requested for you today.  You will receive a letter with results.   most of the time, a "lumpy thyroid" will eventually become overactive.  this is usually a slow process, happening over the span of many years.

## 2012-05-12 NOTE — Progress Notes (Signed)
Subjective:    Patient ID: [name], female    DOB: [birth year], 51 y.o.   MRN: [id number]  HPI Pt was noted 6 years ago to have a multinodular goiter.  bx of the largest right nodule showed "follicular lesion."  She does not notice the goiter.  She has few years of slight intermittent palpitations in the chest, but no assoc chest pain.   Past Medical History  Diagnosis Date  . Mitral valve prolapse   . Rapid heart beat     States "irreg heart beat"  . Hemorrhoids   . Anemia   . GERD (gastroesophageal reflux disease)   . Positive PPD   . Thyroid disease   . Genital warts     Past Surgical History  Procedure Date  . Novasure ablation   . Cardiac catheterization   . Upper gastrointestinal endoscopy May 2013    History   Social History  . Marital Status: Divorced    Spouse Name: N/A    Number of Children: 2  . Years of Education: 16   Occupational History  . dialysis tech    Social History Main Topics  . Smoking status: Never Smoker   . Smokeless tobacco: Never Used  . Alcohol Use: No  . Drug Use: No  . Sexually Active: Not on file   Other Topics Concern  . Not on file   Social History Narrative  . No narrative on file    Current Outpatient Prescriptions on File Prior to Visit  Medication Sig Dispense Refill  . atenolol (TENORMIN) 25 MG tablet Take 25 mg by mouth daily.      . calcium-vitamin D (OSCAL WITH D) 500-200 MG-UNIT per tablet Take 1 tablet by mouth daily.      . cholecalciferol (VITAMIN D) 1000 UNITS tablet Take 2,000 Units by mouth daily.       . ferrous sulfate 325 (65 FE) MG tablet Take 325 mg by mouth daily with breakfast.      . omeprazole (PRILOSEC) 40 MG capsule Take 40 mg by mouth daily as needed. Acid reflux      . PARoxetine (PAXIL-CR) 25 MG 24 hr tablet Take 25 mg by mouth every morning.      . meloxicam (MOBIC) 15 MG tablet Take 1 tablet (15 mg total) by mouth daily.  15 tablet  0  . PredniSONE 5 MG KIT Take 1 kit (5 mg total) by  mouth daily after breakfast. Prednisone 5 mg 6 day dosepack.  Take as directed.  1 kit  0    Allergies  Allergen Reactions  . Ceftriaxone Sodium Itching  . Doxycycline Rash    Family History  Problem Relation Age of Onset  . Colon cancer Father   . Hypertension Maternal Grandfather   . Diabetes Maternal Grandfather   . Diabetes Maternal Grandmother   . Heart disease Maternal Grandfather   no goiter or other thyroid probs  BP 112/70  Pulse 77  Temp 98.1 F (36.7 C) (Oral)  Ht 5\' 6"  (1.676 m)  Wt 201 lb (91.173 kg)  BMI 32.44 kg/m2  SpO2 98%  LMP 04/20/2012  Review of Systems denies headache, hoarseness, double vision, sob, diarrhea, polyuria, numbness, tremor, anxiety, easy bruising, and rhinorrhea.  She reports weight gain, excessive diaphoresis, and arthralgias.  She has infrequent menses.    Objective:   Physical Exam VS: see vs page GEN: no distress HEAD: head: no deformity eyes: no periorbital swelling, no proptosis external nose and ears are  normal mouth: no lesion seen NECK: supple, thyroid is not enlarged CHEST WALL: no deformity LUNGS:  Clear to auscultation.  CV: reg rate and rhythm, no murmur ABD: abdomen is soft, nontender.  no hepatosplenomegaly.  not distended.  no hernia MUSCULOSKELETAL: muscle bulk and strength are grossly normal.  no obvious joint swelling.  gait is normal and steady. EXTEMITIES: no deformity.  no ulcer on the feet.  feet are of normal color and temp.  no edema PULSES: dorsalis pedis intact bilat.   NEURO:  cn 2-12 grossly intact.   readily moves all 4's.  sensation is intact to touch on the feet SKIN:  Normal texture and temperature.  No rash or suspicious lesion is visible.   NODES:  None palpable at the neck PSYCH: alert, oriented x3.  Does not appear anxious nor depressed.   Lab Results  Component Value Date   TSH 0.58 05/12/2012   T3TOTAL 126.2 06/07/2008   T4TOTAL 6.6 06/07/2008   (i reviewed 2007 cytol and 2010 [place]  reports)    Assessment & Plan:  Multinodular goiter, which is usually hereditary Palpitations, not thyroid-related Weight-gain, not thyroid-related

## 2012-05-24 ENCOUNTER — Encounter: Payer: Self-pay | Admitting: Endocrinology

## 2012-05-24 ENCOUNTER — Ambulatory Visit
Admission: RE | Admit: 2012-05-24 | Discharge: 2012-05-24 | Disposition: A | Discharge status: Home / Self Care | Payer: BC Managed Care – PPO | Source: Ambulatory Visit | Attending: Endocrinology | Admitting: Endocrinology

## 2012-05-24 DIAGNOSIS — E042 Nontoxic multinodular goiter: Secondary | ICD-10-CM

## 2012-06-04 ENCOUNTER — Emergency Department (INDEPENDENT_AMBULATORY_CARE_PROVIDER_SITE_OTHER)
Admission: EM | Admit: 2012-06-04 | Discharge: 2012-06-04 | Disposition: A | Discharge status: Home / Self Care | Payer: BC Managed Care – PPO | Source: Home / Self Care | Attending: Emergency Medicine | Admitting: Emergency Medicine

## 2012-06-04 ENCOUNTER — Encounter (HOSPITAL_COMMUNITY): Payer: Self-pay | Admitting: Emergency Medicine

## 2012-06-04 DIAGNOSIS — I471 Supraventricular tachycardia: Secondary | ICD-10-CM

## 2012-06-04 LAB — POCT I-STAT, CHEM 8
Calcium, Ion: 1.24 mmol/L — ABNORMAL HIGH (ref 1.12–1.23)
Chloride: 105 mEq/L (ref 96–112)
Glucose, Bld: 86 mg/dL (ref 70–99)
HCT: 44 % (ref 36.0–46.0)
Hemoglobin: 15 g/dL (ref 12.0–15.0)
TCO2: 25 mmol/L (ref 0–100)

## 2012-06-04 LAB — MAGNESIUM: Magnesium: 1.9 mg/dL (ref 1.5–2.5)

## 2012-06-04 LAB — PHOSPHORUS: Phosphorus: 3 mg/dL (ref 2.3–4.6)

## 2012-06-04 NOTE — ED Provider Notes (Signed)
Chief Complaint  Patient presents with  . 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 She has a history of supraventricular tachycardia and mitral valve prolapse. She is followed by Dr. Algie Coffer. She went on vacation to [facility] when she had an episode of very rapid heartbeat a week ago. She went to the emergency room there at [facility] where her heart rate was found to be between 170 and 180. She was given adenosine and Lopressor. The Lopressor seemed to work better than the adenosine did. Her heart rate was then normalized. She had blood work done including a potassium of 2.7, low magnesium and low phosphate. She was given supplemental potassium and magnesium: Klor-Con 20 mEq twice a day and magnesium oxide 400 mg per day. She was also sent home on Lopressor 25 mg twice daily. She had another episode of palpitations this past Wednesday and returned to the emergency room there in [facility] where potassium was a little bit better at 3.3. This episode went away on its own. She had an episode today of rapid heartbeat beginning around 7:15 AM. This went away on its own after a few minutes. There was no associated chest pain, tightness, pressure, shortness of breath, dizziness, diaphoresis, syncope, or presyncope. She did have some muscle cramps and tingling in her hands and feet and lower back pain.  Review of Systems:  Other than noted above, the patient denies any of the following symptoms. Systemic:  No fever, chills, or fatigue. Pulmonary:  No cough, wheezing, shortness of breath. Cardiac:  No chest pain, tightness, pressure, dizziness, presyncope, syncope, PND, orthopnea, or edema. Ext:  No leg pain or swelling. Neuro:  No weakness, paresthesias, or difficulty with speech or gait. Psych:  No anxiety or depression. Endo:  No weight loss, tremor, sweats, or heat intolerance.  PMFSH:  Past  medical history, family history, social history, meds, and allergies were reviewed and updated as needed. No history of cardiac disease.  No history of excessive alcohol intake.  Physical Exam:   Vital signs:  BP 134/87  Pulse 63  Temp 98.5 F (36.9 C) (Oral)  Resp 16  SpO2 99% Gen:  Alert, oriented, in no distress, skin warm and dry. Eye:  PERRL, lids and conjunctivas normal.  No stare or lid lag. ENT:  Mucous membranes moist, pharynx clear. Neck:  Supple, no adenopathy or tenderness.  No JVD.  Thyroid not enlarged. Lungs:  Clear to auscultation, no wheezes, rales or rhonchi.  No respiratory distress. Heart:  Regular rhythm, no extrasystoles.  No gallops, murmers, clicks or rubs. Abdomen:  Soft, nontender, no organomegaly or mass.  Bowel sounds normal.  No pulsatile abdominal mass or bruit. Ext:  No edema. Pulses full and equal. Skin:  Warm and dry.  No rash.  Labs:   Results for orders placed during the hospital encounter of 06/04/12  MAGNESIUM      Component Value Range   Magnesium 1.9  1.5 - 2.5 mg/dL  PHOSPHORUS      Component Value Range   Phosphorus 3.0  2.3 - 4.6 mg/dL  POCT I-STAT, CHEM 8      Component Value Range   Sodium 144  135 - 145 mEq/L   Potassium 4.2  3.5 - 5.1 mEq/L   Chloride 105  96 - 112 mEq/L   BUN 8  6 - 23 mg/dL   Creatinine,  Ser 0.90  0.50 - 1.10 mg/dL   Glucose, Bld 86  70 - 99 mg/dL   Calcium, Ion 1.61 (*) 1.12 - 1.23 mmol/L   TCO2 25  0 - 100 mmol/L   Hemoglobin 15.0  12.0 - 15.0 g/dL   HCT 09.6  04.5 - 40.9 %     EKG:   Date: 06/04/2012  Rate: 53  Rhythm: sinus bradycardia  QRS Axis: normal  Intervals: normal  ST/T Wave abnormalities: normal  Conduction Disutrbances:none  Narrative Interpretation: Sinus bradycardia with marked sinus arrhythmia, otherwise normal.  Old EKG Reviewed: none available  Assessment:  The encounter diagnosis was PSVT (paroxysmal supraventricular tachycardia).  Plan:   1.  The following meds were prescribed:    New Prescriptions   No medications on file   2.  The patient was instructed in symptomatic care and handouts were given. 3.  The patient was told to return if becoming worse in any way, if no better in 3 or 4 days, and given some red flag symptoms including syncope, presyncope, dyspnea, or chest pain that would indicate earlier return.  Follow up:  The patient was told to follow up with Dr. Algie Coffer later on this week. She should continue on with the Klor-Con, magnesium, and try to get extra dietary phosphate. She may continue with the Lopressor 25 mg twice daily and add an extra pill for acute episodes of palpitations. I suggested she discuss the dosage of the Lopressor with Dr. Algie Coffer. He may want to increase the dose little bit, although her heart rate is very low.     Reuben Likes, MD 06/04/12 2013

## 2012-06-04 NOTE — Discharge Instructions (Signed)
Supraventricular Tachycardia  Supraventricular tachycardia (SVT) is an abnormal heart rhythm (arrhythmia) that causes the heart to beat very fast (tachycardia). This kind of fast heartbeat originates in the upper chambers of the heart (atria). SVT can cause the heart to beat greater than 100 beats per minute. SVT can have a rapid burst of heartbeats. This can start and stop suddenly without warning and is called nonsustained. SVT can also be sustained, in which the heart beats at a continuous fast rate.   CAUSES   There can be different causes of SVT. Some of these include:   Heart valve problems such as mitral valve prolapse.   An enlarged heart (hypertrophic cardiomyopathy).   Congenital heart problems.   Heart inflammation (pericarditis).   Hyperthyroidism.   Low potassium or magnesium levels.   Caffeine.   Drug use such as cocaine, methamphetamines, or stimulants.   Some over-the-counter medicines such as:   Decongestants.   Diet medicines.   Herbal medicines.  SYMPTOMS   Symptoms of SVT can vary. Symptoms depend on whether the SVT is sustained or nonsustained. You may experience:   No symptoms (asymptomatic).   An awareness of your heart beating rapidly (palpitations).   Shortness of breath.   Chest pain or pressure.  If your blood pressure drops because of the SVT, you may experience:   Fainting or near fainting.   Weakness.   Dizziness.  DIAGNOSIS   Different tests can be performed to diagnose SVT, such as:   An electrocardiogram (EKG). This is a painless test that records the electrical activity of your heart.   Holter monitor. This is a 24 hour recording of your heart rhythm. You will be given a diary. Write down all symptoms that you have and what you were doing at the time you experienced symptoms.   Arrhythmia monitor. This is a small device that your wear for several weeks. It records the heart rhythm when you have symptoms.   Echocardiogram. This is an imaging test to help detect  abnormal heart structure such as congenital abnormalities, heart valve problems, or heart enlargement.   Stress test. This test can help determine if the SVT is related to exercise.   Electrophysiology study (EPS). This is a procedure that evaluates your heart's electrical system and can help your caregiver find the cause of your SVT.  TREATMENT   Treatment of SVT depends on the symptoms, how often it recurs, and whether there are any underlying heart problems.    If symptoms are rare and no other cardiac disease is present, no treatment may be needed.   Blood work may be done to check potassium, magnesium, and thyroid hormone levels to see if they are abnormal. If these levels are abnormal, treatment to correct the problems will occur.  Medicines  Your caregiver may use oral medicines to treat SVT. These medicines are given for long-term control of SVT. Medicines may be used alone or in combination with other treatments. These medicines work to slow nerve impulses in the heart muscle. These medicines can also be used to treat high blood pressure. Some of these medicines may include:   Calcium channel blockers.   Beta blockers.   Digoxin.  Nonsurgical procedures  Nonsurgical techniques may be used if oral medicines do not work. Some examples include:   Cardioversion. This technique uses either drugs or an electrical shock to restore a normal heart rhythm.   Cardioversion drugs may be given through an intravenous (IV) line to help "reset" the   heart rhythm.   In electrical cardioversion, the caregiver shocks your heart to stop its beat for a split second. This helps to reset the heart to a normal rhythm.   Ablation. This procedure is done under mild sedation. High frequency radio wave energy is used to destroy the area of heart tissue responsible for the SVT.  HOME CARE INSTRUCTIONS    Do not smoke.   Only take medicines prescribed by your caregiver. Check with your caregiver before using over-the-counter  medicines.   Check with your caregiver about how much alcohol and caffeine (coffee, tea, colas, or chocolate) you may have.   It is very important to keep all follow-up referrals and appointments in order to properly manage this problem.  SEEK IMMEDIATE MEDICAL CARE IF:   You have dizziness.   You faint or nearly faint.   You have shortness of breath.   You have chest pain or pressure.   You have sudden nausea or vomiting.   You have profuse sweating.   You are concerned about how long your symptoms last.   You are concerned about the frequency of your SVT episodes.  If you have the above symptoms, call your local emergency services (911 in [place] immediately. Do not drive yourself to the hospital.  MAKE SURE YOU:    Understand these instructions.   Will watch your condition.   Will get help right away if you are not doing well or get worse.  Document Released: 08/30/2005 Document Revised: 08/19/2011 Document Reviewed: 12/12/2008  [employer] Patient Information 2012 [employer]

## 2012-06-04 NOTE — ED Notes (Signed)
C/o palpitations, irregular heartbeat.  Patient reports symptoms occurred last week, was seen and reported low electrolytes and treated.

## 2012-06-04 NOTE — ED Notes (Signed)
Patient went to the beach last week and was hospitalized and treated for fast heart rate.  Today she did take metoprolol and potassium.  Patient has lower back pain, somewhat relates to sleeping on a different bed than accustomed to sleeping on.

## 2012-06-09 ENCOUNTER — Other Ambulatory Visit: Payer: Self-pay | Admitting: Cardiovascular Disease

## 2012-06-09 DIAGNOSIS — R109 Unspecified abdominal pain: Secondary | ICD-10-CM

## 2012-06-09 DIAGNOSIS — R319 Hematuria, unspecified: Secondary | ICD-10-CM

## 2012-06-09 DIAGNOSIS — R3129 Other microscopic hematuria: Secondary | ICD-10-CM

## 2012-06-13 ENCOUNTER — Other Ambulatory Visit: Payer: BC Managed Care – PPO

## 2012-06-16 ENCOUNTER — Ambulatory Visit
Admission: RE | Admit: 2012-06-16 | Discharge: 2012-06-16 | Disposition: A | Discharge status: Home / Self Care | Payer: BC Managed Care – PPO | Source: Ambulatory Visit | Attending: Cardiovascular Disease | Admitting: Cardiovascular Disease

## 2012-06-16 DIAGNOSIS — R319 Hematuria, unspecified: Secondary | ICD-10-CM

## 2012-06-23 ENCOUNTER — Ambulatory Visit (INDEPENDENT_AMBULATORY_CARE_PROVIDER_SITE_OTHER): Payer: BC Managed Care – PPO | Admitting: Internal Medicine

## 2012-06-23 ENCOUNTER — Encounter: Payer: Self-pay | Admitting: Internal Medicine

## 2012-06-23 VITALS — BP 94/64 | HR 87 | Temp 98.2°F | Ht 66.0 in | Wt 200.0 lb

## 2012-06-23 DIAGNOSIS — R7611 Nonspecific reaction to tuberculin skin test without active tuberculosis: Secondary | ICD-10-CM

## 2012-06-23 DIAGNOSIS — E876 Hypokalemia: Secondary | ICD-10-CM | POA: Insufficient documentation

## 2012-06-23 DIAGNOSIS — K219 Gastro-esophageal reflux disease without esophagitis: Secondary | ICD-10-CM

## 2012-06-23 DIAGNOSIS — Z8679 Personal history of other diseases of the circulatory system: Secondary | ICD-10-CM

## 2012-06-23 DIAGNOSIS — L259 Unspecified contact dermatitis, unspecified cause: Secondary | ICD-10-CM

## 2012-06-23 DIAGNOSIS — Z9889 Other specified postprocedural states: Secondary | ICD-10-CM

## 2012-06-23 DIAGNOSIS — E042 Nontoxic multinodular goiter: Secondary | ICD-10-CM

## 2012-06-23 DIAGNOSIS — L538 Other specified erythematous conditions: Secondary | ICD-10-CM

## 2012-06-23 DIAGNOSIS — L304 Erythema intertrigo: Secondary | ICD-10-CM

## 2012-06-23 DIAGNOSIS — D649 Anemia, unspecified: Secondary | ICD-10-CM

## 2012-06-23 DIAGNOSIS — K589 Irritable bowel syndrome without diarrhea: Secondary | ICD-10-CM

## 2012-06-23 LAB — CBC WITH DIFFERENTIAL/PLATELET
Basophils Absolute: 0 10*3/uL (ref 0.0–0.1)
Basophils Relative: 0.2 % (ref 0.0–3.0)
Eosinophils Absolute: 0.1 10*3/uL (ref 0.0–0.7)
Lymphocytes Relative: 17.8 % (ref 12.0–46.0)
MCHC: 30.6 g/dL (ref 30.0–36.0)
Monocytes Relative: 12.3 % — ABNORMAL HIGH (ref 3.0–12.0)
Neutrophils Relative %: 68.1 % (ref 43.0–77.0)
RBC: 5.15 Mil/uL — ABNORMAL HIGH (ref 3.87–5.11)

## 2012-06-23 LAB — BASIC METABOLIC PANEL
CO2: 29 mEq/L (ref 19–32)
Calcium: 8.3 mg/dL — ABNORMAL LOW (ref 8.4–10.5)
Creatinine, Ser: 0.9 mg/dL (ref 0.4–1.2)
GFR: 85.31 mL/min (ref >60.00)
Sodium: 141 mEq/L (ref 135–145)

## 2012-06-23 LAB — FERRITIN: Ferritin: 26.6 ng/mL (ref 10.0–291.0)

## 2012-06-23 MED ORDER — PREDNISONE 10 MG PO TABS: ORAL_TABLET | ORAL | Status: DC

## 2012-06-23 MED ORDER — FLUOCINONIDE-E 0.05 % EX CREA: TOPICAL_CREAM | Freq: Two times a day (BID) | CUTANEOUS | Status: DC

## 2012-06-23 NOTE — Patient Instructions (Signed)
This rash appears to be contact dermatitis can use a topical cortisone that I gave you twice a day but not on the face. If it is progressing you can take a 12 day course of prednisone. Avoid re\re exposure.  Laboratory tests today will let you know the results in regard to your potassium.  Symptoms on your foot maybe Morton's neuroma  Plan followup depending on how you were doing  Keep the area under your breast dry as possible powders cornstarch etc. can use the cortisone antifungal cream that you have twice a day for up to 2 weeks at this time to calm it down. Followup if persistent or progressive.  Contact Dermatitis Contact dermatitis is a reaction to certain substances that touch the skin. Contact dermatitis can be either irritant contact dermatitis or allergic contact dermatitis. Irritant contact dermatitis does not require previous exposure to the substance for a reaction to occur.Allergic contact dermatitis only occurs if you have been exposed to the substance before. Upon a repeat exposure, your body reacts to the substance.  CAUSES  Many substances can cause contact dermatitis. Irritant dermatitis is most commonly caused by repeated exposure to mildly irritating substances, such as:  Makeup.  Soaps.  Detergents.  Bleaches.  Acids.  Metal salts, such as nickel. Allergic contact dermatitis is most commonly caused by exposure to:  Poisonous plants.  Chemicals (deodorants, shampoos).  Jewelry.  Latex.  Neomycin in triple antibiotic cream.  Preservatives in products, including clothing. SYMPTOMS  The area of skin that is exposed may develop:  Dryness or flaking.  Redness.  Cracks.  Itching.  Pain or a burning sensation.  Blisters. With allergic contact dermatitis, there may also be swelling in areas such as the eyelids, mouth, or genitals.  DIAGNOSIS  Your caregiver can usually tell what the problem is by doing a physical exam. In cases where the cause is  uncertain and an allergic contact dermatitis is suspected, a patch skin test may be performed to help determine the cause of your dermatitis. TREATMENT Treatment includes protecting the skin from further contact with the irritating substance by avoiding that substance if possible. Barrier creams, powders, and gloves may be helpful. Your caregiver may also recommend:  Steroid creams or ointments applied 2 times daily. For best results, soak the rash area in cool water for 20 minutes. Then apply the medicine. Cover the area with a plastic wrap. You can store the steroid cream in the refrigerator for a "chilly" effect on your rash. That may decrease itching. Oral steroid medicines may be needed in more severe cases.  Antibiotics or antibacterial ointments if a skin infection is present.  Antihistamine lotion or an antihistamine taken by mouth to ease itching.  Lubricants to keep moisture in your skin.  Burow's solution to reduce redness and soreness or to dry a weeping rash. Mix one packet or tablet of solution in 2 cups cool water. Dip a clean washcloth in the mixture, wring it out a bit, and put it on the affected area. Leave the cloth in place for 30 minutes. Do this as often as possible throughout the day.  Taking several cornstarch or baking soda baths daily if the area is too large to cover with a washcloth. Harsh chemicals, such as alkalis or acids, can cause skin damage that is like a burn. You should flush your skin for 15 to 20 minutes with cold water after such an exposure. You should also seek immediate medical care after exposure. Bandages (dressings), antibiotics,  and pain medicine may be needed for severely irritated skin.  HOME CARE INSTRUCTIONS  Avoid the substance that caused your reaction.  Keep the area of skin that is affected away from hot water, soap, sunlight, chemicals, acidic substances, or anything else that would irritate your skin.  Do not scratch the rash. Scratching  may cause the rash to become infected.  You may take cool baths to help stop the itching.  Only take over-the-counter or prescription medicines as directed by your caregiver.  See your caregiver for follow-up care as directed to make sure your skin is healing properly. SEEK MEDICAL CARE IF:   Your condition is not better after 3 days of treatment.  You seem to be getting worse.  You see signs of infection such as swelling, tenderness, redness, soreness, or warmth in the affected area.  You have any problems related to your medicines. Document Released: 08/27/2000 Document Revised: 11/22/2011 Document Reviewed: 02/02/2011 [facility] Patient Information 2013 [place], [place].   Intertrigo Intertrigo is a skin condition that occurs in between folds of skin in places on the body that rub together a lot and do not get much ventilation. It is caused by heat, moisture, friction, sweat retention, and lack of air circulation, which produces red, irritated patches and, sometimes, scaling or drainage. People who have diabetes, who are obese, or who have treatment with antibiotics are at increased risk for intertrigo. The most common sites for intertrigo to occur include:  The groin.  The breasts.  The armpits.  Folds of abdominal skin.  Webbed spaces between the fingers or toes. Intertrigo may be aggravated by:  Sweat.  Feces.  Yeast or bacteria that are present near skin folds.  Urine.  Vaginal discharge. HOME CARE INSTRUCTIONS  The following steps can be taken to reduce friction and keep the affected area cool and dry:  Expose skin folds to the air.  Keep deep skin folds separated with cotton or linen cloth. Avoid tight fitting clothing that could cause chafing.  Wear open-toed shoes or sandals to help reduce moisture between the toes.  Apply absorbent powders to affected areas as directed by your caregiver.  Apply over-the-counter barrier pastes, such as zinc oxide, as  directed by your caregiver.  If you develop a fungal infection in the affected area, your caregiver may have you use antifungal creams. SEEK MEDICAL CARE IF:   The rash is not improving after 1 week of treatment.  The rash is getting worse (more red, more swollen, more painful, or spreading).  You have a fever or chills. MAKE SURE YOU:   Understand these instructions.  Will watch your condition.  Will get help right away if you are not doing well or get worse. Document Released: 08/30/2005 Document Revised: 11/22/2011 Document Reviewed: 02/12/2010 [facility] Patient Information 2013 [place], [place].

## 2012-06-23 NOTE — Progress Notes (Signed)
Subjective:    Patient ID: [name], female    DOB: 09/18/1960, 51 y.o.   MRN: [id number]  HPI Patient comes in as new patient visit . Previous care has been via specialist   For SDA new problem . Over the last week she's had itchy rash in her left arm some on her trunk right neck and a few areas on her right lower cheek. She has been outside wonders if it could be plant dermatitis. She has a number of other medical problems that have been relatively stable on medications. She has no new medications except atenolol was changed to metoprolol a month or so ago when she presented to the emergency room with palpitations a low potassium of 2.8. This is without a diuretic unknown etiology.  She has a history of thyroid nodules that have been biopsied as benign but being followed. 2010 she's been seen by cardiology for mitral valve prolapse heart murmur and heart arrhythmias at times. Had a cardiac catheter for chest pain in 2009 normal coronary arteries.  She has a history of heavy periods had no for sure done in 2012 sees gynecologist Dr. Senaida Ores.last pap 2012   She has a problem with sweating a lot in her job because she has to be totally gowned up for time and sweats a good bit drinks plain water to replenish wonders if that was related to her low potassium. She has a rash under her breasts a come and go is more irritated recently with itching asks if she can use triamcinolone nystatin that was given to her by someone else.  Hx of anemia ? Better after novasure.? stilltakes iron   Ms. Lobello is a divorced [place] American female who works as a Teacher, early years/pre who has a number of specialists but no primary care physician. She lived in [place] and in [place] for a number of years her mom is from [place]. 16 years of education went to college at [employer] lives at home with her son who is in his 59s no pets Neg ets tob etoh hx PA 6 hours of sleep G2P2  TD2010   colonoscopy 2009  Review of Systems  No current chest pain shortness of breath fever chills UTI symptoms change in bowel habits blood in stool she is on Paxil for stress when she was divorced and has been on that for quite a while sees Saul Fordyce at [facility]. She hasn't missed any doses. Rest as per hpi hs had some numbness mid toe at times / mortons neuroma  Outpatient Encounter Prescriptions as of 06/23/2012  Medication Sig Dispense Refill  . calcium-vitamin D (OSCAL WITH D) 500-200 MG-UNIT per tablet Take 1 tablet by mouth daily.      . cholecalciferol (VITAMIN D) 1000 UNITS tablet Take 2,000 Units by mouth daily.       . ferrous sulfate 325 (65 FE) MG tablet Take 325 mg by mouth daily with breakfast.      . Magnesium 400 MG CAPS Take by mouth.      . metoprolol succinate (TOPROL-XL) 25 MG 24 hr tablet Take 25 mg by mouth daily.      . pantoprazole (PROTONIX) 40 MG tablet Take 40 mg by mouth as needed.      [name] PARoxetine (PAXIL-CR) 25 MG 24 hr tablet Take 25 mg by mouth every morning.      . Potassium Chloride Crys CR (KLOR-CON M20 PO) Take by mouth.      [name]  DISCONTD: omeprazole (PRILOSEC) 40 MG capsule Take 40 mg by mouth daily as needed. Acid reflux      . DISCONTD: atenolol (TENORMIN) 25 MG tablet Take 25 mg by mouth daily.      [name] DISCONTD: meloxicam (MOBIC) 15 MG tablet Take 1 tablet (15 mg total) by mouth daily.  15 tablet  0  . DISCONTD: PredniSONE 5 MG KIT Take 1 kit (5 mg total) by mouth daily after breakfast. Prednisone 5 mg 6 day dosepack.  Take as directed.  1 kit  0   Past history family history social history enetered  And updated as best possible  in the electronic medical record.     Objective:   Physical Exam BP 94/64  Pulse 87  Temp 98.2 F (36.8 C) (Oral)  Ht 5\' 6"  (1.676 m)  Wt 200 lb (90.719 kg)  BMI 32.28 kg/m2  SpO2 98%  LMP 06/16/2012 WDWN in nad looks well Skin: normal capillary refill ,turgor , color: No  ,petechiae or bruising  Inframammary intertrigo with some satellite lesions    No vesicles  Rash linear  [papulr red left forearm right neck and ? Few on lower cheek right  Chest:  Clear to A&P without wheezes rales or rhonchi CV:  S1-S2 no gallops or murmurs peripheral perfusion is normal Oriented x 3. Normal cognition, attention, speech. Not anxious or depressed appearing   Good eye contact . Feet toe nail 2 thickened .  See notes has had 4 ed visit in the last 6 months       Assessment & Plan:  Rash itching  prob plant dermatitis   Can use topical lidex but if needed may need to add pred for 12 days. rx given   Intertrigo  From sweating and job dress   Disc   Keep dry  Can use the mycolog for 2 weeks  Care with using potent steroid for too long  Local care  Hx of Hypokalemia 2.8 sending her to ed / cause  Has not been delineated will need to review record .  MVP palpitations  On meds hx psvt GERD on meds Hx of adjustment reaction after relationship break up  Years ago stable on paxil  Not missed meds. Hx anemia from menorrhagia rx with Novasure.  Goiter with thyroid nodules followed by Dr Everardo All currently.  Has had 4 ed visit in last 6 months for various reasons  Has had neg head ct chest x ray  Speck card scan [place] of abd  ( enlarged uterus) done for ? Hematuria?  Thyroid ultrasound following nodules .

## 2012-06-24 DIAGNOSIS — Z8679 Personal history of other diseases of the circulatory system: Secondary | ICD-10-CM | POA: Insufficient documentation

## 2012-06-27 ENCOUNTER — Telehealth: Payer: Self-pay | Admitting: Internal Medicine

## 2012-06-27 NOTE — Telephone Encounter (Signed)
Patient called stating that she would like a call back with lab  Results. Please assist.

## 2012-06-28 NOTE — Telephone Encounter (Signed)
See result note  About results

## 2012-06-28 NOTE — Telephone Encounter (Signed)
Labs have not been reviewed yet. Pt requesting results. Please advise

## 2012-06-29 ENCOUNTER — Other Ambulatory Visit: Payer: Self-pay | Admitting: Internal Medicine

## 2012-06-29 ENCOUNTER — Telehealth: Payer: Self-pay | Admitting: Internal Medicine

## 2012-06-29 NOTE — Telephone Encounter (Signed)
Another phone note in chart re: same, attmpted to call all numbers, LMTCB

## 2012-06-29 NOTE — Progress Notes (Signed)
Quick Note:  LMTCB on cell phone ______

## 2012-06-29 NOTE — Telephone Encounter (Signed)
Attempted to call pt work phone [facility]), and had multiple extension selections?[name]  LMTCB on pt cell phone

## 2012-06-29 NOTE — Telephone Encounter (Signed)
Patient came in today, no one has called her with her lab results for her potassium level.  She needs them so she can know how to take her medication.  Could you please call this patient today with this information.

## 2012-07-03 ENCOUNTER — Other Ambulatory Visit: Payer: Self-pay | Admitting: Family Medicine

## 2012-07-03 NOTE — Telephone Encounter (Signed)
Pt notified by telephone. 

## 2012-07-04 ENCOUNTER — Other Ambulatory Visit (INDEPENDENT_AMBULATORY_CARE_PROVIDER_SITE_OTHER): Payer: BC Managed Care – PPO

## 2012-07-04 LAB — PHOSPHORUS: Phosphorus: 3 mg/dL (ref 2.3–4.6)

## 2012-07-04 LAB — BASIC METABOLIC PANEL
Chloride: 103 mEq/L (ref 96–112)
GFR: 107.22 mL/min (ref >60.00)
Potassium: 2.9 mEq/L — ABNORMAL LOW (ref 3.5–5.1)
Sodium: 139 mEq/L (ref 135–145)

## 2012-07-05 LAB — PTH, INTACT AND CALCIUM: Calcium, Total (PTH): 9.1 mg/dL (ref 8.4–10.5)

## 2012-07-12 ENCOUNTER — Telehealth: Payer: Self-pay | Admitting: Family Medicine

## 2012-07-12 ENCOUNTER — Other Ambulatory Visit: Payer: Self-pay | Admitting: Internal Medicine

## 2012-07-12 MED ORDER — POTASSIUM CHLORIDE ER 10 MEQ PO TBCR: 20.0000 meq | EXTENDED_RELEASE_TABLET | Freq: Every day | ORAL | Status: DC

## 2012-07-12 NOTE — Telephone Encounter (Signed)
Sent to the pharmacy

## 2012-07-12 NOTE — Telephone Encounter (Signed)
Last set of labs revealed low potassium.  You wanted to know if pt is taking any medication.  She is not.  She is [facility] [address]  Please advise.  Thanks!!!

## 2012-07-12 NOTE — Telephone Encounter (Signed)
Please send in KCL 10 meq take 2 po qd  Disp 60 refill x 1   recheck  BMp as discussed in 7- 10 days

## 2012-07-28 ENCOUNTER — Emergency Department (HOSPITAL_COMMUNITY): Payer: BC Managed Care – PPO

## 2012-07-28 ENCOUNTER — Encounter (HOSPITAL_COMMUNITY): Payer: Self-pay | Admitting: *Deleted

## 2012-07-28 ENCOUNTER — Emergency Department (HOSPITAL_COMMUNITY)
Admission: EM | Admit: 2012-07-28 | Discharge: 2012-07-28 | Disposition: A | Discharge status: Home / Self Care | Payer: BC Managed Care – PPO | Attending: Emergency Medicine | Admitting: Emergency Medicine

## 2012-07-28 DIAGNOSIS — Z862 Personal history of diseases of the blood and blood-forming organs and certain disorders involving the immune mechanism: Secondary | ICD-10-CM | POA: Insufficient documentation

## 2012-07-28 DIAGNOSIS — Z8679 Personal history of other diseases of the circulatory system: Secondary | ICD-10-CM | POA: Insufficient documentation

## 2012-07-28 DIAGNOSIS — R002 Palpitations: Secondary | ICD-10-CM | POA: Insufficient documentation

## 2012-07-28 DIAGNOSIS — Z8619 Personal history of other infectious and parasitic diseases: Secondary | ICD-10-CM | POA: Insufficient documentation

## 2012-07-28 DIAGNOSIS — K219 Gastro-esophageal reflux disease without esophagitis: Secondary | ICD-10-CM | POA: Insufficient documentation

## 2012-07-28 DIAGNOSIS — Z8639 Personal history of other endocrine, nutritional and metabolic disease: Secondary | ICD-10-CM | POA: Insufficient documentation

## 2012-07-28 DIAGNOSIS — D649 Anemia, unspecified: Secondary | ICD-10-CM | POA: Insufficient documentation

## 2012-07-28 DIAGNOSIS — Z79899 Other long term (current) drug therapy: Secondary | ICD-10-CM | POA: Insufficient documentation

## 2012-07-28 HISTORY — DX: Reserved for inherently not codable concepts without codable children: IMO0001

## 2012-07-28 LAB — COMPREHENSIVE METABOLIC PANEL
Albumin: 3.1 g/dL — ABNORMAL LOW (ref 3.5–5.2)
Alkaline Phosphatase: 68 U/L (ref 39–117)
BUN: 14 mg/dL (ref 6–23)
Potassium: 3.5 mEq/L (ref 3.5–5.1)
Sodium: 139 mEq/L (ref 135–145)
Total Protein: 6 g/dL (ref 6.0–8.3)

## 2012-07-28 LAB — CBC
HCT: 37.2 % (ref 36.0–46.0)
MCHC: 32.5 g/dL (ref 30.0–36.0)
Platelets: 215 10*3/uL (ref 150–400)
RDW: 14.9 % (ref 11.5–15.5)

## 2012-07-28 LAB — MAGNESIUM: Magnesium: 2 mg/dL (ref 1.5–2.5)

## 2012-07-28 NOTE — ED Notes (Signed)
MD at bedside. 

## 2012-07-28 NOTE — ED Notes (Signed)
Pt alert and oriented from home sts that she has been feeling heart palpitations on and off all day today- sts that when she feels her heart do this she usually has hypokalemia. Patient sts she has also had left arm pain as well but is not experiencing it at this time. Pt sts she has had shortness of breath along with this. Pt denies pain.

## 2012-07-28 NOTE — ED Provider Notes (Signed)
History     CSN: [id number]  Arrival date & time 07/28/12  0106   First MD Initiated Contact with Patient 07/28/12 0140      Chief Complaint  Patient presents with  . Palpitations     HPI Pt was seen at 0150.  Per pt, c/o gradual onset and persistence of multiple intermittent episodes of "irregular HR" all day yesterday.  Pt states she has previous hx of same, dx with "low potassium," and rx potassium supplements.  Pt states she came to the ED today to have her potassium level checked.  Specifically denies CP/SOB, no cough, no abd pain, no back pain, no N/V/D.    Past Medical History  Diagnosis Date  . Mitral valve prolapse   . Rapid heart beat     States "irreg heart beat"  . Hemorrhoids   . Anemia   . GERD (gastroesophageal reflux disease)   . Positive PPD   . Thyroid disease   . Genital warts   . Hx of cardiac catheterization 2009    normal coronary arteries  . Hx of menorrhagia     Novasure  . Normal cardiac stress test 03/09/2012    no ischemia    Past Surgical History  Procedure Date  . Novasure ablation   . Cardiac catheterization   . Upper gastrointestinal endoscopy May 2013    Family History  Problem Relation Age of Onset  . Colon cancer Father     MGF not father ?  [name] Hypertension Maternal Grandfather   . Diabetes Maternal Grandfather   . Diabetes Maternal Grandmother   . Heart disease Maternal Grandfather   . Heart disease Father     died Mi 47    History  Substance Use Topics  . Smoking status: Never Smoker   . Smokeless tobacco: Never Used  . Alcohol Use: No    Review of Systems ROS: Statement: All systems negative except as marked or noted in the HPI; Constitutional: Negative for fever and chills. ; ; Eyes: Negative for eye pain, redness and discharge. ; ; ENMT: Negative for ear pain, hoarseness, nasal congestion, sinus pressure and sore throat. ; ; Cardiovascular: +"irregular HR." Negative for chest pain, diaphoresis, dyspnea and  peripheral edema. ; ; Respiratory: Negative for cough, wheezing and stridor. ; ; Gastrointestinal: Negative for nausea, vomiting, diarrhea, abdominal pain, blood in stool, hematemesis, jaundice and rectal bleeding. . ; ; Genitourinary: Negative for dysuria, flank pain and hematuria. ; ; Musculoskeletal: Negative for back pain and neck pain. Negative for swelling and trauma.; ; Skin: Negative for pruritus, rash, abrasions, blisters, bruising and skin lesion.; ; Neuro: Negative for headache, lightheadedness and neck stiffness. Negative for weakness, altered level of consciousness , altered mental status, extremity weakness, paresthesias, involuntary movement, seizure and syncope.       Allergies  Ceftriaxone sodium and Doxycycline  Home Medications   Current Outpatient Rx  Name  Route  Sig  Dispense  Refill  . CALCIUM CARBONATE-VITAMIN D 500-200 MG-UNIT PO TABS   Oral   Take 1 tablet by mouth daily.         [name] VITAMIN D 1000 UNITS PO TABS   Oral   Take 2,000 Units by mouth daily.          [name] FERROUS SULFATE 325 (65 FE) MG PO TABS   Oral   Take 325 mg by mouth daily with breakfast.         . MAGNESIUM 400 MG PO CAPS  Oral   Take 1 tablet by mouth daily.          [name] METOPROLOL TARTRATE 25 MG PO TABS   Oral   Take 12.5 mg by mouth 2 (two) times daily.         [name] PAROXETINE HCL ER 25 MG PO TB24   Oral   Take 25 mg by mouth every morning.         [name] POTASSIUM CHLORIDE ER 10 MEQ PO TBCR   Oral   Take 20 mEq by mouth daily.           BP 110/64  Pulse 69  Temp 97.9 F (36.6 C) (Oral)  Resp 16  Ht 5\' 6"  (1.676 m)  Wt 200 lb (90.719 kg)  BMI 32.28 kg/m2  SpO2 99%  LMP 07/16/2012  Physical Exam 0155: Physical examination:  Nursing notes reviewed; Vital signs and O2 SAT reviewed;  Constitutional: Well developed, Well nourished, Well hydrated, In no acute distress; Head:  Normocephalic, atraumatic; Eyes: EOMI, PERRL, No scleral icterus; ENMT: Mouth and pharynx  normal, Mucous membranes moist; Neck: Supple, Full range of motion, No lymphadenopathy; Cardiovascular: Regular rate and rhythm, No murmur, rub, or gallop; Respiratory: Breath sounds clear & equal bilaterally, No rales, rhonchi, wheezes.  Speaking full sentences with ease, Normal respiratory effort/excursion; Chest: Nontender, Movement normal; Abdomen: Soft, Nontender, Nondistended, Normal bowel sounds;; Extremities: Pulses normal, No tenderness, No edema, No calf edema or asymmetry.; Neuro: AA&Ox3, Major CN grossly intact.  Speech clear. No gross focal motor or sensory deficits in extremities.; Skin: Color normal, Warm, Dry.   ED Course  Procedures   03/09/2012 NM stress test: no ischemia  06/07/2008 Cardiac cath: normal coronary arteries  MDM  MDM Reviewed: nursing note, vitals and previous chart Reviewed previous: ECG and labs Interpretation: ECG, labs and x-ray    Date: 07/28/2012  Rate: 67  Rhythm: normal sinus rhythm  QRS Axis: normal  Intervals: normal  ST/T Wave abnormalities: normal  Conduction Disutrbances:none  Narrative Interpretation:   Old EKG Reviewed: unchanged; no significant changes from previous EKGs dated 06/04/2012, 03/10/2312.     Results for orders placed during the hospital encounter of 07/28/12  CBC      Component Value Range   WBC 5.9  4.0 - 10.5 K/uL   RBC 4.94  3.87 - 5.11 MIL/uL   Hemoglobin 12.1  12.0 - 15.0 g/dL   HCT 52.8  41.3 - 24.4 %   MCV 75.3 (*) 78.0 - 100.0 fL   MCH 24.5 (*) 26.0 - 34.0 pg   MCHC 32.5  30.0 - 36.0 g/dL   RDW 01.0  27.2 - 53.6 %   Platelets 215  150 - 400 K/uL  COMPREHENSIVE METABOLIC PANEL      Component Value Range   Sodium 139  135 - 145 mEq/L   Potassium 3.5  3.5 - 5.1 mEq/L   Chloride 105  96 - 112 mEq/L   CO2 26  19 - 32 mEq/L   Glucose, Bld 101 (*) 70 - 99 mg/dL   BUN 14  6 - 23 mg/dL   Creatinine, Ser 6.44  0.50 - 1.10 mg/dL   Calcium 8.7  8.4 - 03.4 mg/dL   Total Protein 6.0  6.0 - 8.3 g/dL   Albumin 3.1  (*) 3.5 - 5.2 g/dL   AST 17  0 - 37 U/L   ALT 13  0 - 35 U/L   Alkaline Phosphatase 68  39 - 117 U/L  Total Bilirubin 0.2 (*) 0.3 - 1.2 mg/dL   GFR calc non Af Amer >90  >90 mL/min   GFR calc Af Amer >90  >90 mL/min  MAGNESIUM      Component Value Range   Magnesium 2.0  1.5 - 2.5 mg/dL   Dg Chest 2 View 16/06/9603  *RADIOLOGY REPORT*  Clinical Data: Heart palpitations.  CHEST - 2 VIEW  Comparison: 05/02/2012  Findings: Heart and mediastinal contours are within normal limits. No focal opacities or effusions.  No acute bony abnormality.  IMPRESSION: No active cardiopulmonary disease.   Original Report Authenticated By: Charlett Nose, M.D.      [phone]:  Pt has been sleeping soundly most of her ED visit.  Monitor with NSR, no ectopy, no tachycardia during her several hour ED stay.  Pt ambulatory around the ED with steady gait, easy resps.  Continues to deny CP/SOB.  No palpitations while in the ED.  Wants to go home now; will take her morning potassium dose when she gets home.  Dx and testing d/w pt.  Questions answered.  Verb understanding, agreeable to d/c home with outpt f/u.       Laray Anger, DO 07/30/12 [phone]

## 2012-08-09 ENCOUNTER — Telehealth: Payer: Self-pay | Admitting: Internal Medicine

## 2012-08-09 NOTE — Telephone Encounter (Signed)
Pt reports a change in bowel habits; cramping, diarrhea and she found blood in her stool. She saw Dr Rhea Belton earlier and had an EGD, but this is new to her. She will see Mike Gip, PA on 08/15/12 and I scheduled her COLON d/t lack of appts

## 2012-08-15 ENCOUNTER — Other Ambulatory Visit (INDEPENDENT_AMBULATORY_CARE_PROVIDER_SITE_OTHER): Payer: BC Managed Care – PPO

## 2012-08-15 ENCOUNTER — Encounter: Payer: Self-pay | Admitting: Physician Assistant

## 2012-08-15 ENCOUNTER — Ambulatory Visit (INDEPENDENT_AMBULATORY_CARE_PROVIDER_SITE_OTHER): Payer: BC Managed Care – PPO | Admitting: Physician Assistant

## 2012-08-15 VITALS — BP 104/60 | HR 72 | Ht 66.0 in | Wt 201.0 lb

## 2012-08-15 DIAGNOSIS — E876 Hypokalemia: Secondary | ICD-10-CM

## 2012-08-15 DIAGNOSIS — Z800 Family history of malignant neoplasm of digestive organs: Secondary | ICD-10-CM

## 2012-08-15 DIAGNOSIS — K921 Melena: Secondary | ICD-10-CM

## 2012-08-15 MED ORDER — MOVIPREP 100 G PO SOLR: 1.0000 | Freq: Once | ORAL | Status: AC

## 2012-08-15 NOTE — Progress Notes (Signed)
Agree with assessment and plans as outlined 

## 2012-08-15 NOTE — Patient Instructions (Addendum)
You have been scheduled for a colonoscopy with propofol. Please follow written instructions given to you at your visit today.  Please pick up your prep kit at the pharmacy within the next 1-3 days. [facility] [address]. If you use inhalers (even only as needed) or a CPAP machine, please bring them with you on the day of your procedure.

## 2012-08-15 NOTE — Progress Notes (Signed)
Subjective:    Patient ID: [name], female    DOB: 10-22-1960, 51 y.o.   MRN: [id number]  HPI Phillipa is a pleasant 51 year old female known to Dr. Rhea Belton with history of GERD. She underwent upper endoscopy in February 2013 and this was a normal exam. She comes in today as an add-on with complaints of bright red blood noted mixed with her bowel movements intermittently over the past several weeks.. She says she has alternating bowel habits with days of diarrhea alternating with days of constipation. She has no complaint of hemorrhoids rectal pain discomfort etc. She's not taking any regular aspirin or NSAIDs . She denies any ongoing abdominal pain. He reports seeing primarily bright red blood and dark red blood streaked and mixed with her stool. She did have a colonoscopy remotely with Dr. Laural Benes, I do not have a copy of that report. She does have family history of colon cancer in her maternal grandfather diagnosed in his 41s.  Patient has history of iron deficiency anemia which has been attributed to menorrhagia. Her last hemoglobin was 12.1 hematocrit of 37.1 with an MCV of 75 in mid November. She had a ferritin done in October which was low normal and she is maintained on ferrous sulfate 325 mg by mouth daily currently by her primary care physician.    Review of Systems  Constitutional: Negative.   HENT: Negative.   Eyes: Negative.   Respiratory: Negative.   Cardiovascular: Negative.   Gastrointestinal: Positive for diarrhea, constipation and blood in stool.  Genitourinary: Negative.   Musculoskeletal: Negative.   Neurological: Negative.   Hematological: Negative.   Psychiatric/Behavioral: Negative.    Outpatient Prescriptions Prior to Visit  Medication Sig Dispense Refill  . calcium-vitamin D (OSCAL WITH D) 500-200 MG-UNIT per tablet Take 1 tablet by mouth daily.      . cholecalciferol (VITAMIN D) 1000 UNITS tablet Take 2,000 Units by mouth daily.       . ferrous sulfate 325  (65 FE) MG tablet Take 325 mg by mouth daily with breakfast.      . Magnesium 400 MG CAPS Take 1 tablet by mouth daily.       . metoprolol tartrate (LOPRESSOR) 25 MG tablet Take 12.5 mg by mouth 2 (two) times daily.      [name] PARoxetine (PAXIL-CR) 25 MG 24 hr tablet Take 25 mg by mouth every morning.      . potassium chloride (K-DUR) 10 MEQ tablet Take 20 mEq by mouth daily.       Allergies  Allergen Reactions  . Ceftriaxone Sodium Itching  . Doxycycline Rash   Patient Active Problem List  Diagnosis  . GERD  . DYSPEPSIA  . CONSTIPATION  . IRRITABLE BOWEL SYNDROME  . SLEEP APNEA  . Chest pain at rest  . Sinus tachycardia  . Multinodular goiter  . Contact dermatitis  . Hypokalemia  . Intertrigo  . Hx of cardiac catheterization  . Positive PPD  . Anemia  . History of PSVT (paroxysmal supraventricular tachycardia)   History  Substance Use Topics  . Smoking status: Never Smoker   . Smokeless tobacco: Never Used  . Alcohol Use: No       Objective:   Physical Exam  well-developed African American female in no acute distress, pleasant blood pressure 104/60 pulse 72 height 5 foot 6 weight 201. HEENT; nontraumatic normocephalic EOMI PERRLA sclera anicteric,Neck; Supple no JVD, Cardiovascular; regular rate and rhythm with S1-S2 no significant murmur rub or gallop, Pulmonary; clear bilaterally,  Abdomen ;soft she is minimally tender in the right lower quadrant there is no guarding or rebound, no palpable mass or hepatosplenomegaly bowel sounds are active, Rectal; exam not done, Extremities; no clubbing cyanosis or edema- skin warm and dry, Psych; mood and affect normal and appropriate        Assessment & Plan:  #59  51 year old female with change in bowel habits and intermittent blood in stool over the past several weeks. Rule out local anorectal source, rule out occult neoplasm.  #2 chronic GERD-stable on Protonix  #3 history of iron deficiency anemia previously attributed  to  menorrhagia.  Plan; patient is scheduled for colonoscopy with Dr. Rhea Belton next week. Procedure was discussed in detail and she is agreeable to proceed. She specifically asks not to be heavily sedated and does not want propofol and therefore will be set up with moderate sedation.  Patient has had recent mild hypokalemia, and is now on replacement- will repeat today

## 2012-08-22 ENCOUNTER — Ambulatory Visit (AMBULATORY_SURGERY_CENTER): Payer: BC Managed Care – PPO | Admitting: Internal Medicine

## 2012-08-22 ENCOUNTER — Encounter: Payer: Self-pay | Admitting: Internal Medicine

## 2012-08-22 ENCOUNTER — Encounter: Payer: BC Managed Care – PPO | Admitting: Internal Medicine

## 2012-08-22 VITALS — BP 119/70 | HR 63 | Temp 97.7°F | Resp 19 | Ht 66.0 in | Wt 201.0 lb

## 2012-08-22 DIAGNOSIS — K625 Hemorrhage of anus and rectum: Secondary | ICD-10-CM

## 2012-08-22 DIAGNOSIS — R198 Other specified symptoms and signs involving the digestive system and abdomen: Secondary | ICD-10-CM

## 2012-08-22 DIAGNOSIS — R194 Change in bowel habit: Secondary | ICD-10-CM

## 2012-08-22 DIAGNOSIS — D126 Benign neoplasm of colon, unspecified: Secondary | ICD-10-CM

## 2012-08-22 DIAGNOSIS — R109 Unspecified abdominal pain: Secondary | ICD-10-CM

## 2012-08-22 MED ORDER — SODIUM CHLORIDE 0.9 % IV SOLN: 500.0000 mL | INTRAVENOUS | Status: DC

## 2012-08-22 NOTE — Progress Notes (Signed)
Patient did not have preoperative order for IV antibiotic SSI prophylaxis. (G8918)  Patient did not experience any of the following events: a burn prior to discharge; a fall within the facility; wrong site/side/patient/procedure/implant event; or a hospital transfer or hospital admission upon discharge from the facility. (G8907)  

## 2012-08-22 NOTE — Op Note (Signed)
[facility] [address] [place], [address]   COLONOSCOPY PROCEDURE REPORT  PATIENT: [name], [name]  MR#: [id number] BIRTHDATE: 04-Jul-1961 , 51  yrs. old GENDER: Female ENDOSCOPIST: Beverley Fiedler, MD REFERRED XB:[id number], Amy PROCEDURE DATE:  08/22/2012 PROCEDURE:   Colonoscopy with snare polypectomy ASA CLASS:   Class II INDICATIONS:Change in bowel habits and rectal bleeding. MEDICATIONS: These medications were titrated to patient response per physician's verbal order, Fentanyl 100 mcg IV, and Versed 1mg  IV  DESCRIPTION OF PROCEDURE:   After the risks benefits and alternatives of the procedure were thoroughly explained, informed consent was obtained.  A digital rectal exam revealed no rectal mass and A digital rectal exam revealed several skin tags.   The LB [id number]  endoscope was introduced through the anus and advanced to the terminal ileum which was intubated for a short distance. No adverse events experienced.   The quality of the prep was good, using MoviPrep  The instrument was then slowly withdrawn as the colon was fully examined.   COLON FINDINGS: The mucosa appeared normal in the terminal ileum. A sessile polyp measuring 4 mm in size was found in the ascending colon.  A polypectomy was performed with a cold snare.  The resection was complete and the polyp tissue was completely retrieved.   There was mild scattered diverticulosis noted in the sigmoid colon.  Otherwise the colon mucosa appeared normal without evidence for inflammation. Retroflexed views revealed no abnormalities. The time to cecum=7 minutes 32 seconds.  Withdrawal time=15 minutes 12 seconds.  The scope was withdrawn and the procedure completed.  COMPLICATIONS: There were no complications.  ENDOSCOPIC IMPRESSION: 1.   Normal mucosa in the terminal ileum 2.   Sessile polyp measuring 4 mm in size was found in the ascending colon; polypectomy was performed with a cold  snare 3.   There was mild diverticulosis noted in the sigmoid colon 4.   Perianal skin tags, raising suspicion for hemorrhoidal disease  RECOMMENDATIONS: 1.  Await pathology results 2.  If the polyp removed today is proven to be adenomatous (pre-cancerous), you will need a repeat colonoscopy in 5 years. Otherwise you should continue to follow colorectal cancer screening guidelines for "routine risk" patients with colonoscopy in 10 years.  You will receive a letter within 1-2 weeks with the results of your biopsy as well as final recommendations.  Please call my office if you have not received a letter after 3 weeks. 3.  Trial of Align 1 capsule daily given recent alternating constipation and loose stools. 4.  Office follow-up if bowel habits fail to normalizeeSigned:  Beverley Fiedler, MD 08/22/2012 10:30 AM   cc: The Patient and Madelin Headings, MD   PATIENT NAME:  Danielle, Hanson MR#: [id number]

## 2012-08-22 NOTE — Progress Notes (Signed)
VSS-uneventful anesthesia awake & responsive in pacu

## 2012-08-22 NOTE — Progress Notes (Signed)
Called to room to assist during endoscopic procedure.  Patient ID and intended procedure confirmed with present staff. Received instructions for my participation in the procedure from the performing physician.  

## 2012-08-22 NOTE — Patient Instructions (Addendum)
YOU HAD AN ENDOSCOPIC PROCEDURE TODAY AT THE [facility]: Refer to the procedure report that was given to you for any specific questions about what was found during the examination.  If the procedure report does not answer your questions, please call your gastroenterologist to clarify.  If you requested that your care partner not be given the details of your procedure findings, then the procedure report has been included in a sealed envelope for you to review at your convenience later.  YOU SHOULD EXPECT: Some feelings of bloating in the abdomen. Passage of more gas than usual.  Walking can help get rid of the air that was put into your GI tract during the procedure and reduce the bloating. If you had a lower endoscopy (such as a colonoscopy or flexible sigmoidoscopy) you may notice spotting of blood in your stool or on the toilet paper. If you underwent a bowel prep for your procedure, then you may not have a normal bowel movement for a few days.  DIET: Your first meal following the procedure should be a light meal and then it is ok to progress to your normal diet.  A half-sandwich or bowl of soup is an example of a good first meal.  Heavy or fried foods are harder to digest and may make you feel nauseous or bloated.  Likewise meals heavy in dairy and vegetables can cause extra gas to form and this can also increase the bloating.  Drink plenty of fluids but you should avoid alcoholic beverages for 24 hours.  ACTIVITY: Your care partner should take you home directly after the procedure.  You should plan to take it easy, moving slowly for the rest of the day.  You can resume normal activity the day after the procedure however you should NOT DRIVE or use heavy machinery for 24 hours (because of the sedation medicines used during the test).    SYMPTOMS TO REPORT IMMEDIATELY: A gastroenterologist can be reached at any hour.  During normal business hours, 8:30 AM to 5:00 PM Monday through Friday,  call [phone].  After hours and on weekends, please call the GI answering service at [phone] who will take a message and have the physician on call contact you.   Following lower endoscopy (colonoscopy or flexible sigmoidoscopy):  Excessive amounts of blood in the stool  Significant tenderness or worsening of abdominal pains  Swelling of the abdomen that is new, acute  Fever of 100F or higher  Following upper endoscopy (EGD)  Vomiting of blood or coffee ground material  New chest pain or pain under the shoulder blades  Painful or persistently difficult swallowing  New shortness of breath  Fever of 100F or higher  Black, tarry-looking stools  FOLLOW UP: If any biopsies were taken you will be contacted by phone or by letter within the next 1-3 weeks.  Call your gastroenterologist if you have not heard about the biopsies in 3 weeks.  Our staff will call the home number listed on your records the next business day following your procedure to check on you and address any questions or concerns that you may have at that time regarding the information given to you following your procedure. This is a courtesy call and so if there is no answer at the home number and we have not heard from you through the emergency physician on call, we will assume that you have returned to your regular daily activities without incident.  SIGNATURES/CONFIDENTIALITY: You and/or your care   partner have signed paperwork which will be entered into your electronic medical record.  These signatures attest to the fact that that the information above on your After Visit Summary has been reviewed and is understood.  Full responsibility of the confidentiality of this discharge information lies with you and/or your care-partner.  

## 2012-08-23 ENCOUNTER — Telehealth: Payer: Self-pay

## 2012-08-23 NOTE — Telephone Encounter (Signed)
Not available

## 2012-08-28 ENCOUNTER — Encounter: Payer: Self-pay | Admitting: Internal Medicine

## 2012-08-28 DIAGNOSIS — K635 Polyp of colon: Secondary | ICD-10-CM

## 2012-08-28 HISTORY — DX: Polyp of colon: K63.5

## 2012-08-29 ENCOUNTER — Ambulatory Visit: Payer: BC Managed Care – PPO | Admitting: Internal Medicine

## 2012-09-01 ENCOUNTER — Encounter (HOSPITAL_COMMUNITY): Payer: Self-pay | Admitting: *Deleted

## 2012-09-01 ENCOUNTER — Emergency Department (HOSPITAL_COMMUNITY)
Admission: EM | Admit: 2012-09-01 | Discharge: 2012-09-01 | Disposition: A | Discharge status: Home / Self Care | Payer: BC Managed Care – PPO | Attending: Emergency Medicine | Admitting: Emergency Medicine

## 2012-09-01 ENCOUNTER — Emergency Department (HOSPITAL_COMMUNITY): Payer: BC Managed Care – PPO

## 2012-09-01 DIAGNOSIS — Z8679 Personal history of other diseases of the circulatory system: Secondary | ICD-10-CM | POA: Insufficient documentation

## 2012-09-01 DIAGNOSIS — Z79899 Other long term (current) drug therapy: Secondary | ICD-10-CM | POA: Insufficient documentation

## 2012-09-01 DIAGNOSIS — R002 Palpitations: Secondary | ICD-10-CM | POA: Insufficient documentation

## 2012-09-01 DIAGNOSIS — I471 Supraventricular tachycardia, unspecified: Secondary | ICD-10-CM | POA: Insufficient documentation

## 2012-09-01 DIAGNOSIS — Z9889 Other specified postprocedural states: Secondary | ICD-10-CM | POA: Insufficient documentation

## 2012-09-01 DIAGNOSIS — E876 Hypokalemia: Secondary | ICD-10-CM | POA: Insufficient documentation

## 2012-09-01 DIAGNOSIS — Z8639 Personal history of other endocrine, nutritional and metabolic disease: Secondary | ICD-10-CM | POA: Insufficient documentation

## 2012-09-01 DIAGNOSIS — Z862 Personal history of diseases of the blood and blood-forming organs and certain disorders involving the immune mechanism: Secondary | ICD-10-CM | POA: Insufficient documentation

## 2012-09-01 LAB — CBC
MCH: 24.5 pg — ABNORMAL LOW (ref 26.0–34.0)
MCHC: 32.5 g/dL (ref 30.0–36.0)
MCV: 75.5 fL — ABNORMAL LOW (ref 78.0–100.0)
Platelets: 252 10*3/uL (ref 150–400)
RDW: 14.5 % (ref 11.5–15.5)
WBC: 9.9 10*3/uL (ref 4.0–10.5)

## 2012-09-01 LAB — COMPREHENSIVE METABOLIC PANEL
AST: 17 U/L (ref 0–37)
Albumin: 3.6 g/dL (ref 3.5–5.2)
Alkaline Phosphatase: 68 U/L (ref 39–117)
BUN: 11 mg/dL (ref 6–23)
Chloride: 100 mEq/L (ref 96–112)
Potassium: 3 mEq/L — ABNORMAL LOW (ref 3.5–5.1)
Total Bilirubin: 0.2 mg/dL — ABNORMAL LOW (ref 0.3–1.2)

## 2012-09-01 LAB — MAGNESIUM: Magnesium: 1.8 mg/dL (ref 1.5–2.5)

## 2012-09-01 LAB — NA AND K (SODIUM & POTASSIUM), RAND UR
Potassium Urine: 61 mEq/L
Sodium, Ur: 96 mEq/L

## 2012-09-01 LAB — TROPONIN I: Troponin I: <0.3 ng/mL (ref <0.30)

## 2012-09-01 MED ORDER — POTASSIUM CHLORIDE CRYS ER 20 MEQ PO TBCR
40.0000 meq | EXTENDED_RELEASE_TABLET | Freq: Once | ORAL | Status: AC
  Administered 2012-09-01: 40 meq via ORAL
  Filled 2012-09-01: qty 2

## 2012-09-01 MED ORDER — POTASSIUM CHLORIDE CRYS ER 20 MEQ PO TBCR: EXTENDED_RELEASE_TABLET | ORAL | Status: DC

## 2012-09-01 NOTE — ED Notes (Signed)
Ambulated patient in hall. Heart rate at rest was 69, while ambulating was 86. Patients gait was stable while ambulating

## 2012-09-01 NOTE — ED Provider Notes (Signed)
History     CSN: [id number]  Arrival date & time 09/01/12  0530   First MD Initiated Contact with Patient 09/01/12 0701      Chief Complaint  Patient presents with  . Palpitations    (Consider location/radiation/quality/duration/timing/severity/associated sxs/prior treatment) HPI  Patient reports on her way to work today she stopped to put air in her tires and noticed that it made her feel short of breath to bend over. She states she got back in her car and then noted she started having racing of her heart and felt her heart was beating out of her chest. She states it made her feel lightheaded and dizzy and had some shortness of breath. She denies chest pain or diaphoresis. She states she took two whole metoprolol which is 50 mg and within 40 minutes her symptoms have improved and the feeling of her heart racing has resolved when she arrived to the ED.  She reports in September she was at the beach and had a similar episode. At that time her heart rate was 170 and adenosine did not convert her to sinus rhythm. They ended up using IV Lopressor which controlled her heart rate. She was also found at that time to have a low potassium and low magnesium and phosphorus. She reports she takes potassium 10 mEq a day. She has been seen by her PCP since then and her magnesium and thyroid test were normal.  PCP Dr Fabian Sharp Cardiologist Dr Collier Flowers Nephrologist Dr Juanell Fairly  Past Medical History  Diagnosis Date  . Mitral valve prolapse   . Rapid heart beat     States "irreg heart beat"  . Hemorrhoids   . Anemia   . GERD (gastroesophageal reflux disease)   . Positive PPD   . Thyroid disease   . Genital warts   . Hx of cardiac catheterization 2009    normal coronary arteries  . Hx of menorrhagia     Novasure  . Normal cardiac stress test 03/09/2012    no ischemia    Past Surgical History  Procedure Date  . Novasure ablation   . Cardiac catheterization   . Upper gastrointestinal  endoscopy May 2013    Family History  Problem Relation Age of Onset  . Heart disease Father     died Mi 54  . Hypertension Maternal Grandfather   . Diabetes Maternal Grandfather   . Heart disease Maternal Grandfather   . Colon cancer Maternal Grandfather   . Diabetes Maternal Grandmother     History  Substance Use Topics  . Smoking status: Never Smoker   . Smokeless tobacco: Never Used  . Alcohol Use: No  employed  OB History    Grav Para Term Preterm Abortions TAB SAB Ect Mult Living                  Review of Systems  All other systems reviewed and are negative.    Allergies  Ceftriaxone sodium and Doxycycline  Home Medications   Current Outpatient Rx  Name  Route  Sig  Dispense  Refill  . CALCIUM CARBONATE-VITAMIN D 500-200 MG-UNIT PO TABS   Oral   Take 1 tablet by mouth daily.         [name] VITAMIN D 1000 UNITS PO TABS   Oral   Take 2,000 Units by mouth daily.          [name] METOPROLOL TARTRATE 25 MG PO TABS   Oral   Take 12.5 mg by mouth  2 (two) times daily.         [name] PANTOPRAZOLE SODIUM 40 MG PO TBEC   Oral   Take 40 mg by mouth daily as needed. For acid reflux         . PAROXETINE HCL ER 25 MG PO TB24   Oral   Take 25 mg by mouth every morning.         [name] POTASSIUM CHLORIDE ER 10 MEQ PO TBCR   Oral   Take 20 mEq by mouth daily.           BP 117/64  Pulse 81  Temp 97.5 F (36.4 C)  Resp 19  SpO2 96%  LMP 08/14/2012  Vital signs normal HR during my exam was 76   Physical Exam  Nursing note and vitals reviewed. Constitutional: She is oriented to person, place, and time. She appears well-developed and well-nourished.  Non-toxic appearance. She does not appear ill. No distress.  HENT:  Head: Normocephalic and atraumatic.  Right Ear: External ear normal.  Left Ear: External ear normal.  Nose: Nose normal. No mucosal edema or rhinorrhea.  Mouth/Throat: Oropharynx is clear and moist and mucous membranes are normal. No dental  abscesses or uvula swelling.  Eyes: Conjunctivae normal and EOM are normal. Pupils are equal, round, and reactive to light.  Neck: Normal range of motion and full passive range of motion without pain. Neck supple.  Cardiovascular: Normal rate, regular rhythm and normal heart sounds.  Exam reveals no gallop and no friction rub.   No murmur heard. Pulmonary/Chest: Effort normal and breath sounds normal. No respiratory distress. She has no wheezes. She has no rhonchi. She has no rales. She exhibits no tenderness and no crepitus.  Abdominal: Normal appearance.  Musculoskeletal: Normal range of motion. She exhibits no edema and no tenderness.       Moves all extremities well.   Neurological: She is alert and oriented to person, place, and time. She has normal strength. No cranial nerve deficit.  Skin: Skin is warm, dry and intact. No rash noted. No erythema. No pallor.  Psychiatric: She has a normal mood and affect. Her speech is normal and behavior is normal. Her mood appears not anxious.    ED Course  Procedures (including critical care time)   Medications  potassium chloride SA (K-DUR,KLOR-CON) CR tablet 4 0 mEq (40 mEq Oral Given 09/01/12 0751)   Review of her labs shows that she did have normal magnesium and thyroid test in October.  PT ambulated and her HR was 69-86 and she felt fine.   Results for orders placed during the hospital encounter of 09/01/12  COMPREHENSIVE METABOLIC PANEL      Component Value Range   Sodium 136  135 - 145 mEq/L   Potassium 3.0 (*) 3.5 - 5.1 mEq/L   Chloride 100  96 - 112 mEq/L   CO2 24  19 - 32 mEq/L   Glucose, Bld 108 (*) 70 - 99 mg/dL   BUN 11  6 - 23 mg/dL   Creatinine, Ser 6.44  0.50 - 1.10 mg/dL   Calcium 9.2  8.4 - 03.4 mg/dL   Total Protein 7.1  6.0 - 8.3 g/dL   Albumin 3.6  3.5 - 5.2 g/dL   AST 17  0 - 37 U/L   ALT 12  0 - 35 U/L   Alkaline Phosphatase 68  39 - 117 U/L   Total Bilirubin 0.2 (*) 0.3 - 1.2 mg/dL   GFR calc  non Af Amer >90   >90 mL/min   GFR calc Af Amer >90  >90 mL/min  CBC      Component Value Range   WBC 9.9  4.0 - 10.5 K/uL   RBC 5.75 (*) 3.87 - 5.11 MIL/uL   Hemoglobin 14.1  12.0 - 15.0 g/dL   HCT 16.1  09.6 - 04.5 %   MCV 75.5 (*) 78.0 - 100.0 fL   MCH 24.5 (*) 26.0 - 34.0 pg   MCHC 32.5  30.0 - 36.0 g/dL   RDW 40.9  81.1 - 91.4 %   Platelets 252  150 - 400 K/uL  TROPONIN I      Component Value Range   Troponin I <0.30  <0.30 ng/mL   Laboratory interpretation all normal except hypokalemia   Dg Chest 2 View  09/01/2012  *RADIOLOGY REPORT*  Clinical Data: Palpitations  CHEST - 2 VIEW  Comparison: 07/28/2012  Findings: Cardiomediastinal contours remain within normal range. Lungs are predominately clear.  No pleural effusion or pneumothorax.  No acute osseous finding.  IMPRESSION: No radiographic evidence of acute cardiopulmonary process.   Original Report Authenticated By: Jearld Lesch, M.D.     Date: 09/01/2012  Rate: 122  Rhythm: sinus tachycardia  QRS Axis: normal  Intervals: normal  ST/T Wave abnormalities: normal  Conduction Disutrbances:none  Narrative Interpretation:   Old EKG Reviewed: changes noted from 07/28/2012 HR was 67    1. Palpitation   2. SVT (supraventricular tachycardia)   3. Hypokalemia      New Prescriptions   POTASSIUM CHLORIDE SA (K-DUR,KLOR-CON) 20 MEQ TABLET    Take 2 at noon and 2 tabs this evening then twice a day until gone.    Plan discharge  Devoria Albe, MD, FACEP    MDM          Ward Givens, MD 09/01/12 [phone]

## 2012-09-01 NOTE — ED Notes (Addendum)
Patient reports driving to work this morning and onset of her heart beating fast,  started around 0515 this morning. Pt says that she took 50mg  of Lopressor, and 10 meq of Potassium at onset of palpitations

## 2012-09-05 ENCOUNTER — Emergency Department (HOSPITAL_COMMUNITY): Payer: BC Managed Care – PPO

## 2012-09-05 ENCOUNTER — Encounter (HOSPITAL_COMMUNITY): Payer: Self-pay | Admitting: *Deleted

## 2012-09-05 ENCOUNTER — Emergency Department (HOSPITAL_COMMUNITY)
Admission: EM | Admit: 2012-09-05 | Discharge: 2012-09-06 | Disposition: A | Discharge status: Home / Self Care | Payer: BC Managed Care – PPO | Attending: Emergency Medicine | Admitting: Emergency Medicine

## 2012-09-05 DIAGNOSIS — Z8742 Personal history of other diseases of the female genital tract: Secondary | ICD-10-CM | POA: Insufficient documentation

## 2012-09-05 DIAGNOSIS — Z8719 Personal history of other diseases of the digestive system: Secondary | ICD-10-CM | POA: Insufficient documentation

## 2012-09-05 DIAGNOSIS — R002 Palpitations: Secondary | ICD-10-CM

## 2012-09-05 DIAGNOSIS — R6884 Jaw pain: Secondary | ICD-10-CM | POA: Insufficient documentation

## 2012-09-05 DIAGNOSIS — R109 Unspecified abdominal pain: Secondary | ICD-10-CM | POA: Insufficient documentation

## 2012-09-05 DIAGNOSIS — Z9861 Coronary angioplasty status: Secondary | ICD-10-CM | POA: Insufficient documentation

## 2012-09-05 DIAGNOSIS — Z862 Personal history of diseases of the blood and blood-forming organs and certain disorders involving the immune mechanism: Secondary | ICD-10-CM | POA: Insufficient documentation

## 2012-09-05 DIAGNOSIS — Z79899 Other long term (current) drug therapy: Secondary | ICD-10-CM | POA: Insufficient documentation

## 2012-09-05 DIAGNOSIS — Z8679 Personal history of other diseases of the circulatory system: Secondary | ICD-10-CM | POA: Insufficient documentation

## 2012-09-05 DIAGNOSIS — R0789 Other chest pain: Secondary | ICD-10-CM

## 2012-09-05 DIAGNOSIS — Z8619 Personal history of other infectious and parasitic diseases: Secondary | ICD-10-CM | POA: Insufficient documentation

## 2012-09-05 DIAGNOSIS — Z8639 Personal history of other endocrine, nutritional and metabolic disease: Secondary | ICD-10-CM | POA: Insufficient documentation

## 2012-09-05 LAB — CBC WITH DIFFERENTIAL/PLATELET
Basophils Absolute: 0 10*3/uL (ref 0.0–0.1)
Eosinophils Relative: 1 % (ref 0–5)
Lymphocytes Relative: 24 % (ref 12–46)
Monocytes Relative: 10 % (ref 3–12)
Neutrophils Relative %: 65 % (ref 43–77)
Platelets: 217 10*3/uL (ref 150–400)
RBC: 5.06 MIL/uL (ref 3.87–5.11)
RDW: 14.4 % (ref 11.5–15.5)
WBC: 7.7 10*3/uL (ref 4.0–10.5)

## 2012-09-05 LAB — BASIC METABOLIC PANEL
CO2: 28 mEq/L (ref 19–32)
Chloride: 102 mEq/L (ref 96–112)
Creatinine, Ser: 0.9 mg/dL (ref 0.50–1.10)
GFR calc Af Amer: 83 mL/min — ABNORMAL LOW (ref >90)
Sodium: 137 mEq/L (ref 135–145)

## 2012-09-05 LAB — TROPONIN I: Troponin I: <0.3 ng/mL (ref <0.30)

## 2012-09-05 MED ORDER — ASPIRIN 81 MG PO CHEW
324.0000 mg | CHEWABLE_TABLET | Freq: Once | ORAL | Status: AC
  Administered 2012-09-05: 324 mg via ORAL
  Filled 2012-09-05: qty 4

## 2012-09-05 NOTE — ED Notes (Signed)
Jaw pain started this am and was intermittent  Throughout day

## 2012-09-05 NOTE — ED Provider Notes (Signed)
History     CSN: [id number]  Arrival date & time 09/05/12  2140   First MD Initiated Contact with Patient 09/05/12 2226      Chief Complaint  Patient presents with  . Abdominal Cramping  . Chest Pain  . Jaw Pain   HPI  History provided by the patient. Patient is a 51 year old female with history of mitral valve prolapse, hypokalemia who presents with concerns for a low potassium and heart palpitations. Patient reports she has been having issues with her potassium at her primary care provider and cardiologist have been working with her. She has been taking potassium but states it tends to be low regardless. She was recently seen in emergency room last week for similar symptoms and had her potassium increased. She reports having continued episodes of similar symptoms of heart palpitations. Today she also reports having some central chest discomfort is and left jaw pain. The symptoms have been intermittent and brief. Jaw pain has been more persistent and frequent. She denies any aggravating or alleviating factors. She also reports having some occasional cramps to her bilateral calf areas. He denies any swelling of lower extremities. Denies any cough or shortness of breath symptoms.   Past Medical History  Diagnosis Date  . Mitral valve prolapse   . Rapid heart beat     States "irreg heart beat"  . Hemorrhoids   . Anemia   . GERD (gastroesophageal reflux disease)   . Positive PPD   . Thyroid disease   . Genital warts   . Hx of cardiac catheterization 2009    normal coronary arteries  . Hx of menorrhagia     Novasure  . Normal cardiac stress test 03/09/2012    no ischemia    Past Surgical History  Procedure Date  . Novasure ablation   . Cardiac catheterization   . Upper gastrointestinal endoscopy May 2013    Family History  Problem Relation Age of Onset  . Heart disease Father     died Mi 65  . Hypertension Maternal Grandfather   . Diabetes Maternal Grandfather   . Heart  disease Maternal Grandfather   . Colon cancer Maternal Grandfather   . Diabetes Maternal Grandmother     History  Substance Use Topics  . Smoking status: Never Smoker   . Smokeless tobacco: Never Used  . Alcohol Use: No    OB History    Grav Para Term Preterm Abortions TAB SAB Ect Mult Living                  Review of Systems  Constitutional: Negative for fever, chills and diaphoresis.  Respiratory: Negative for cough and shortness of breath.   Cardiovascular: Positive for chest pain and palpitations. Negative for leg swelling.  Gastrointestinal: Negative for nausea, vomiting, abdominal pain, diarrhea and constipation.  Neurological: Negative for dizziness and light-headedness.  All other systems reviewed and are negative.    Allergies  Ceftriaxone sodium and Doxycycline  Home Medications   Current Outpatient Rx  Name  Route  Sig  Dispense  Refill  . CALCIUM CARBONATE-VITAMIN D 500-200 MG-UNIT PO TABS   Oral   Take 1 tablet by mouth daily.         [name] VITAMIN D 1000 UNITS PO TABS   Oral   Take 2,000 Units by mouth daily.          [name] METOPROLOL TARTRATE 25 MG PO TABS   Oral   Take 12.5 mg by mouth 2 (  two) times daily.         [name] PAROXETINE HCL ER 25 MG PO TB24   Oral   Take 25 mg by mouth every morning.         [name] POTASSIUM CHLORIDE ER 10 MEQ PO TBCR   Oral   Take 80 mEq by mouth daily. Take for 5 day this dose then check by with Dr.           BP 109/57  Pulse 67  Temp 98.3 F (36.8 C) (Oral)  Resp 18  Ht 5\' 6"  (1.676 m)  Wt 201 lb (91.173 kg)  BMI 32.44 kg/m2  SpO2 99%  LMP 08/14/2012  Physical Exam  Nursing note and vitals reviewed. Constitutional: She is oriented to person, place, and time. She appears well-developed and well-nourished. No distress.  HENT:  Head: Normocephalic.  Cardiovascular: Normal rate and regular rhythm.   Pulmonary/Chest: Effort normal and breath sounds normal. No respiratory distress. She has no wheezes. She has  no rales.  Abdominal: Soft. There is no tenderness. There is no rebound and no guarding.  Musculoskeletal: She exhibits no edema and no tenderness.  Neurological: She is alert and oriented to person, place, and time.  Skin: Skin is warm and dry.  Psychiatric: She has a normal mood and affect. Her behavior is normal.    ED Course  Procedures   Results for orders placed during the hospital encounter of 09/05/12  CBC WITH DIFFERENTIAL      Component Value Range   WBC 7.7  4.0 - 10.5 K/uL   RBC 5.06  3.87 - 5.11 MIL/uL   Hemoglobin 12.5  12.0 - 15.0 g/dL   HCT 16.1  09.6 - 04.5 %   MCV 74.5 (*) 78.0 - 100.0 fL   MCH 24.7 (*) 26.0 - 34.0 pg   MCHC 33.2  30.0 - 36.0 g/dL   RDW 40.9  81.1 - 91.4 %   Platelets 217  150 - 400 K/uL   Neutrophils Relative 65  43 - 77 %   Lymphocytes Relative 24  12 - 46 %   Monocytes Relative 10  3 - 12 %   Eosinophils Relative 1  0 - 5 %   Basophils Relative 0  0 - 1 %   Neutro Abs 5.0  1.7 - 7.7 K/uL   Lymphs Abs 1.8  0.7 - 4.0 K/uL   Monocytes Absolute 0.8  0.1 - 1.0 K/uL   Eosinophils Absolute 0.1  0.0 - 0.7 K/uL   Basophils Absolute 0.0  0.0 - 0.1 K/uL   RBC Morphology POLYCHROMASIA PRESENT    BASIC METABOLIC PANEL      Component Value Range   Sodium 137  135 - 145 mEq/L   Potassium 4.0  3.5 - 5.1 mEq/L   Chloride 102  96 - 112 mEq/L   CO2 28  19 - 32 mEq/L   Glucose, Bld 96  70 - 99 mg/dL   BUN 12  6 - 23 mg/dL   Creatinine, Ser 7.82  0.50 - 1.10 mg/dL   Calcium 9.2  8.4 - 95.6 mg/dL   GFR calc non Af Amer 72 (*) >90 mL/min   GFR calc Af Amer 83 (*) >90 mL/min  TROPONIN I      Component Value Range   Troponin I <0.30  <0.30 ng/mL  TROPONIN I      Component Value Range   Troponin I <0.30  <0.30 ng/mL  Dg Chest 2 View  09/05/2012  *RADIOLOGY REPORT*  Clinical Data: Chest pain, tachycardia  CHEST - 2 VIEW  Comparison: 09/01/2012  Findings: Upper-normal size of cardiac silhouette. Mediastinal contours and pulmonary vascularity  normal. Lungs clear. No pleural effusion or pneumothorax. Bones unremarkable.  IMPRESSION: No acute abnormalities.   Original Report Authenticated By: Ulyses Southward, M.D.      1. Chest discomfort   2. Palpitations       MDM  10:15 PM patient seen and evaluated. Patient resting comfortably he appears calm in no acute distress. Patient without significant symptoms at this time.  Patient is normally followed with Dr. Algie Coffer with cardiology. She reports having normal stress tests 3-4 months ago.  patient discussed with attending physician. Agrees with current workup. Patient with atypical symptoms negative troponin x2. Unremarkable EKG without change. At this time patient felt safe for discharge home with continued outpatient followup.     Date: 09/05/2012  Rate: 67  Rhythm: normal sinus rhythm  QRS Axis: normal  Intervals: normal  ST/T Wave abnormalities: normal  Conduction Disutrbances:none  Narrative Interpretation:   Old EKG Reviewed: unchanged    Date: 09/06/2012  Rate: 64  Rhythm: normal sinus rhythm  QRS Axis: normal  Intervals: normal  ST/T Wave abnormalities: normal  Conduction Disutrbances:none  Narrative Interpretation:   Old EKG Reviewed: unchanged      Angus Seller, PA 09/06/12 2016

## 2012-09-05 NOTE — ED Notes (Signed)
Pt states she was seen here last Thursday for leg cramping and arhythmia due to low potassium and she doesn't feel any better.  Pt is alert and oriented in NAD

## 2012-09-06 LAB — TROPONIN I: Troponin I: <0.3 ng/mL (ref <0.30)

## 2012-09-06 NOTE — ED Provider Notes (Signed)
Medical screening examination/treatment/procedure(s) were performed by non-physician practitioner and as supervising physician I was immediately available for consultation/collaboration.    Celene Kras, MD 09/06/12 2204

## 2012-09-06 NOTE — ED Notes (Signed)
Pt c/o intermittent chest pain and recent jaw pain that started yesterday morning. Pt stated she usually has heart arrhthymias when her potassium is low. Potassium is 4.0 today.

## 2012-09-09 ENCOUNTER — Other Ambulatory Visit: Payer: Self-pay | Admitting: Internal Medicine

## 2012-09-12 ENCOUNTER — Other Ambulatory Visit: Payer: Self-pay | Admitting: Internal Medicine

## 2012-09-12 ENCOUNTER — Telehealth: Payer: Self-pay | Admitting: Internal Medicine

## 2012-09-12 MED ORDER — POTASSIUM CHLORIDE CRYS ER 20 MEQ PO TBCR: 20.0000 meq | EXTENDED_RELEASE_TABLET | Freq: Two times a day (BID) | ORAL | Status: DC

## 2012-09-12 NOTE — Telephone Encounter (Signed)
Sent in bid #[phone] to the pharmacy.

## 2012-09-12 NOTE — Telephone Encounter (Signed)
Pt requesting another rx for Potassium. States that the last time, she had to go to hospital and they put her on a higher dosage - . Pt states you can call her for more info. Thinks she may need to get ehr K+ drawn before correct dose is known. Uses [facility] on [address].

## 2012-09-12 NOTE — Telephone Encounter (Signed)
Tried reaching the pt by telephone.  Per WP, the pt should have a BMP and Magnesium along with a follow up (15).  Will send in a new rx for potassium.

## 2012-09-12 NOTE — Telephone Encounter (Signed)
Patient called to inquire on previous request.

## 2012-09-14 ENCOUNTER — Other Ambulatory Visit: Payer: Self-pay | Admitting: Family Medicine

## 2012-09-14 DIAGNOSIS — E876 Hypokalemia: Secondary | ICD-10-CM

## 2012-09-14 NOTE — Telephone Encounter (Signed)
The pt will have labwork on 09/15/12.  She will call back to make appt with Dr. Fabian Sharp when she sees what day she is off next week.

## 2012-09-15 ENCOUNTER — Other Ambulatory Visit (INDEPENDENT_AMBULATORY_CARE_PROVIDER_SITE_OTHER): Payer: BC Managed Care – PPO

## 2012-09-15 DIAGNOSIS — E876 Hypokalemia: Secondary | ICD-10-CM

## 2012-09-15 LAB — MAGNESIUM: Magnesium: 1.7 mg/dL (ref 1.5–2.5)

## 2012-09-15 LAB — BASIC METABOLIC PANEL
BUN: 10 mg/dL (ref 6–23)
Chloride: 105 mEq/L (ref 96–112)
Creatinine, Ser: 0.8 mg/dL (ref 0.4–1.2)
GFR: 103.85 mL/min (ref >60.00)
Potassium: 3.8 mEq/L (ref 3.5–5.1)

## 2012-09-20 ENCOUNTER — Telehealth: Payer: Self-pay | Admitting: Internal Medicine

## 2012-09-20 ENCOUNTER — Other Ambulatory Visit: Payer: Self-pay | Admitting: Internal Medicine

## 2012-09-20 NOTE — Telephone Encounter (Signed)
Left message on home/cell for the pt to return my call. 

## 2012-09-20 NOTE — Telephone Encounter (Signed)
Change potassium to take 20 meq once a day  Disp 30 no refills   and have her come in in about  7- 10 days and we will decide on next step .  May repeat at that time and get more info about  Referral possible.

## 2012-09-20 NOTE — Telephone Encounter (Signed)
Pt is calling to get the results of her blood work drawn on 09/15/12.  RN gave patient results and also gave pt message from Dr Fabian Sharp (recorded 09/21/11 at 0908).  Pt states she does not have any one else managing her potassium.  She wants Dr Fabian Sharp to manage or refer her to a specialist (kidney or endocrine).   Pt also states she needs a refill of her Potassium ( ).  OFFICE PLEASE FOLLOW UP WITH PT REGARDING REFERRAL AND ALSO RX REFILL

## 2012-09-21 ENCOUNTER — Encounter: Payer: Self-pay | Admitting: Internal Medicine

## 2012-09-21 ENCOUNTER — Ambulatory Visit (INDEPENDENT_AMBULATORY_CARE_PROVIDER_SITE_OTHER): Payer: BC Managed Care – PPO | Admitting: Internal Medicine

## 2012-09-21 VITALS — BP 104/66 | HR 64 | Temp 98.2°F | Wt 206.0 lb

## 2012-09-21 DIAGNOSIS — IMO0001 Reserved for inherently not codable concepts without codable children: Secondary | ICD-10-CM

## 2012-09-21 DIAGNOSIS — E876 Hypokalemia: Secondary | ICD-10-CM

## 2012-09-21 DIAGNOSIS — E042 Nontoxic multinodular goiter: Secondary | ICD-10-CM

## 2012-09-21 DIAGNOSIS — K219 Gastro-esophageal reflux disease without esophagitis: Secondary | ICD-10-CM

## 2012-09-21 DIAGNOSIS — Z8679 Personal history of other diseases of the circulatory system: Secondary | ICD-10-CM

## 2012-09-21 DIAGNOSIS — R61 Generalized hyperhidrosis: Secondary | ICD-10-CM

## 2012-09-21 MED ORDER — POTASSIUM CHLORIDE CRYS ER 20 MEQ PO TBCR: 20.0000 meq | EXTENDED_RELEASE_TABLET | Freq: Every day | ORAL | Status: DC

## 2012-09-21 MED ORDER — POTASSIUM CHLORIDE CRYS ER 20 MEQ PO TBCR: 20.0000 meq | EXTENDED_RELEASE_TABLET | Freq: Two times a day (BID) | ORAL | Status: DC

## 2012-09-21 NOTE — Telephone Encounter (Signed)
Patient notified in her OV on 09/22/11

## 2012-09-21 NOTE — Patient Instructions (Addendum)
You will be contacted about referral appointment to nephrology. Because k dropped on 20 per day then will continue on 40 per day  we will check laboratory studies in one week to include chemistries aldosterone levels and thyroid levels. And hemoglobin A1c You'll probably need urine evaluation for potassium excretion.eventually  Contact us in the meantime if getting worse.  There can be many causes of low potassium but you do not appear to have any obvious underlying cause for this. I do not think medications are causing this.

## 2012-09-21 NOTE — Telephone Encounter (Signed)
09/21/12 

## 2012-09-21 NOTE — Progress Notes (Signed)
Chief Complaint  Patient presents with  . Follow-up    hypokalemia    HPI: Pt comes in today SDA  for evaluation of hypokalemia .   Originally was feeling very weak when on vac at [employer] and went to [place] and had K of 2.7  For no particular reason.had low magnesium and phosphorous also off.    Then went low again. And seen in [facility] and k was 3.0   Had inc oral supplement  Advised 80 per day  And then on 40 per day.  No supplement diuretic fever or reg diarrhea .  No etoh .  Diarrhea   With some dairy but no t a regular . Hx of renal ultrasound.    Ok  Was told may need to see renal doc.  No family hx of potassium problems  Thyroid  Problem   ROS: See pertinent positives and negatives per HPI. 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  Heat intolerance     Was on paxil when son having problems for a number of years.  Dr Phillip Heal.   And. then kelly virgil.  Stable   Past Medical History  Diagnosis Date  . Mitral valve prolapse   . Rapid heart beat     States "irreg heart beat"  . Hemorrhoids   . Anemia   . GERD (gastroesophageal reflux disease)   . Positive PPD   . Thyroid disease   . Genital warts   . Hx of cardiac catheterization 2009    normal coronary arteries  . Hx of menorrhagia     Novasure  . Normal cardiac stress test 03/09/2012    no ischemia    Family History  Problem Relation Age of Onset  . Heart disease Father     died Mi 69  . Hypertension Maternal Grandfather   . Diabetes Maternal Grandfather   . Heart disease Maternal Grandfather   . Colon cancer Maternal Grandfather   . Diabetes Maternal Grandmother     History   Social History  . Marital Status: Divorced    Spouse Name: N/A    Number of Children: 2  . Years of Education: 16   Occupational History  . dialysis  tech    Social History Main Topics  . Smoking status: Never Smoker   . Smokeless tobacco: Never Used  . Alcohol Use: No  . Drug Use: No  . Sexually Active: None   Other Topics Concern  . None   Social History Narrative   [name] is a divorced [place] American female who works as a Teacher, early years/pre who has a number of specialists but no primary care physician. She lived in [place] and in [place] for a number of years her mom is from [place]. 16 years of education went to college at [employer] lives at home with her son who is in his 10s no petsNeg ets tob etoh hx PA6 hours of sleepG2P2 TD2010  colonoscopy 2009    Outpatient Encounter Prescriptions as of 09/21/2012  Medication Sig Dispense Refill  . calcium-vitamin D (OSCAL WITH D) 500-200 MG-UNIT per tablet Take 1 tablet by mouth daily.      . cholecalciferol (VITAMIN D) 1000 UNITS tablet Take 2,000  Units by mouth daily.       . metoprolol tartrate (LOPRESSOR) 25 MG tablet Take 12.5 mg by mouth 2 (two) times daily.      . pantoprazole (PROTONIX) 40 MG tablet Take 40 mg by mouth daily.      [name] PARoxetine (PAXIL-CR) 25 MG 24 hr tablet Take 25 mg by mouth every morning.      . [DISCONTINUED] potassium chloride (K-DUR) 10 MEQ tablet Take 80 mEq by mouth daily. Take for 5 day this dose then check by with Dr.      . [DISCONTINUED] potassium chloride SA (K-DUR,KLOR-CON) 20 MEQ tablet Take 1 tablet (20 mEq total) by mouth 2 (two) times daily.  14 tablet  0  . potassium chloride SA (K-DUR,KLOR-CON) 20 MEQ tablet Take 1 tablet (20 mEq total) by mouth 2 (two) times daily.  60 tablet  0  . [DISCONTINUED] potassium chloride SA (K-DUR,KLOR-CON) 20 MEQ tablet Take 1 tablet (20 mEq total) by mouth daily.  30 tablet  0  . [DISCONTINUED] potassium chloride SA (K-DUR,KLOR-CON) 20 MEQ tablet Take 20 mEq by mouth 2 (two) times daily.        EXAM:  BP 104/66  Pulse 64  Temp 98.2 F (36.8 C) (Oral)  Wt 206 lb (93.441 kg)  SpO2 98%   LMP 09/14/2012  There is no height on file to calculate BMI.  GENERAL: vitals reviewed and listed above, alert, oriented, appears well hydrated and in no acute distress  HEENT: atraumatic, conjunctiva  clear, no obvious abnormalities on inspection of external nose and ears OP : no lesion edema or exudate   NECK: no obvious masses on inspection palpation  Thyroid easily palpable no nodule noted   LUNGS: clear to auscultation bilaterally, no wheezes, rales or rhonchi, good air movement  CV: HRRR, no clubbing cyanosis or  peripheral edema nl cap refill   MS: moves all extremities without noticeable focal  abnormality  PSYCH: pleasant and cooperative, no obvious depression or anxiety Lab Results  Component Value Date   WBC 7.7 09/05/2012   HGB 12.5 09/05/2012   HCT 37.7 09/05/2012   PLT 217 09/05/2012   GLUCOSE 64* 09/15/2012   CHOL 115 03/10/2012   TRIG 61 03/10/2012   HDL 50 03/10/2012   LDLCALC 53 03/10/2012   ALT 12 09/01/2012   AST 17 09/01/2012   NA 140 09/15/2012   K 3.8 09/15/2012   CL 105 09/15/2012   CREATININE 0.8 09/15/2012   BUN 10 09/15/2012   CO2 28 09/15/2012   TSH 0.58 05/12/2012   INR 0.95 03/10/2012   HGBA1C  Value: 5.8 (NOTE)   The ADA recommends the following therapeutic goal for glycemic   control related to Hgb A1C measurement:   Goal of Therapy:   < 7.0% Hgb A1C   Reference: American Diabetes Association: Clinical Practice   Recommendations 2008, Diabetes Care,  2008, 31:(Suppl 1). 11/22/2008    ASSESSMENT AND PLAN:  Discussed the following assessment and plan:  1. Hypokalemia  pantoprazole (PROTONIX) 40 MG tablet, potassium chloride SA (K-DUR,KLOR-CON) 20 MEQ tablet, Basic metabolic panel, TSH, T4, free, T3, free, Magnesium, Hemoglobin A1c, Aldosterone + renin activity w/ ratio, Urinalysis, Ambulatory referral to Nephrology   recurrent uncertain cause   2 ed visits  check repeat lab aldo thyroid renal consult   2. Sweating  pantoprazole (PROTONIX) 40 MG tablet,  potassium chloride SA (K-DUR,KLOR-CON) 20 MEQ tablet, Basic metabolic panel, TSH, T4, free, T3, free, Magnesium, Hemoglobin A1c, Aldosterone + renin  activity w/ ratio, Urinalysis   states heat intolerance and sweats a lot   3. GERD     taking protonix only prn and not daily   4. Multinodular goiter    5. History of PSVT (paroxysmal supraventricular tachycardia)    pt concern about pre diabetes with pp sx  Will get hg a1c .   -Patient advised to return or notify health care team  immediately if symptoms worsen or persist or new concerns arise.  Patient Instructions  You will be contacted about referral appointment to nephrology. Because k dropped on 20 per day then will continue on 40 per day  we will check laboratory studies in one week to include chemistries aldosterone levels and thyroid levels. And hemoglobin A1c You'll probably need urine evaluation for potassium excretion.eventually  Contact us in the meantime if getting worse.  There can be many causes of low potassium but you do not appear to have any obvious underlying cause for this. I do not think medications are causing this.  Neta Mends. Kashae Carstens M.D.

## 2012-09-29 ENCOUNTER — Other Ambulatory Visit (INDEPENDENT_AMBULATORY_CARE_PROVIDER_SITE_OTHER): Payer: BC Managed Care – PPO

## 2012-09-29 ENCOUNTER — Telehealth: Payer: Self-pay | Admitting: *Deleted

## 2012-09-29 DIAGNOSIS — R61 Generalized hyperhidrosis: Secondary | ICD-10-CM

## 2012-09-29 DIAGNOSIS — IMO0001 Reserved for inherently not codable concepts without codable children: Secondary | ICD-10-CM

## 2012-09-29 DIAGNOSIS — E876 Hypokalemia: Secondary | ICD-10-CM

## 2012-09-29 LAB — URINALYSIS, ROUTINE W REFLEX MICROSCOPIC
Ketones, ur: NEGATIVE
Specific Gravity, Urine: >=1.03 (ref 1.000–1.030)
Total Protein, Urine: NEGATIVE
Urine Glucose: NEGATIVE
pH: 5.5 (ref 5.0–8.0)

## 2012-09-29 LAB — BASIC METABOLIC PANEL
Chloride: 105 mEq/L (ref 96–112)
GFR: 103.83 mL/min (ref >60.00)
Potassium: 3.9 mEq/L (ref 3.5–5.1)
Sodium: 138 mEq/L (ref 135–145)

## 2012-09-29 LAB — T3, FREE: T3, Free: 2.8 pg/mL (ref 2.3–4.2)

## 2012-09-29 LAB — MAGNESIUM: Magnesium: 2 mg/dL (ref 1.5–2.5)

## 2012-09-29 LAB — TSH: TSH: 0.78 u[IU]/mL (ref 0.35–5.50)

## 2012-09-29 LAB — T4, FREE: Free T4: 0.62 ng/dL (ref 0.60–1.60)

## 2012-09-29 NOTE — Telephone Encounter (Signed)
Let patient know labs look good per Dr. Fabian Sharp.

## 2012-10-26 ENCOUNTER — Emergency Department (HOSPITAL_COMMUNITY)
Admission: EM | Admit: 2012-10-26 | Discharge: 2012-10-26 | Disposition: A | Discharge status: Home / Self Care | Payer: BC Managed Care – PPO | Attending: Emergency Medicine | Admitting: Emergency Medicine

## 2012-10-26 ENCOUNTER — Encounter (HOSPITAL_COMMUNITY): Payer: Self-pay | Admitting: Emergency Medicine

## 2012-10-26 DIAGNOSIS — K219 Gastro-esophageal reflux disease without esophagitis: Secondary | ICD-10-CM | POA: Insufficient documentation

## 2012-10-26 DIAGNOSIS — Z8679 Personal history of other diseases of the circulatory system: Secondary | ICD-10-CM | POA: Insufficient documentation

## 2012-10-26 DIAGNOSIS — Z862 Personal history of diseases of the blood and blood-forming organs and certain disorders involving the immune mechanism: Secondary | ICD-10-CM | POA: Insufficient documentation

## 2012-10-26 DIAGNOSIS — Z9889 Other specified postprocedural states: Secondary | ICD-10-CM | POA: Insufficient documentation

## 2012-10-26 DIAGNOSIS — Z8619 Personal history of other infectious and parasitic diseases: Secondary | ICD-10-CM | POA: Insufficient documentation

## 2012-10-26 DIAGNOSIS — R002 Palpitations: Secondary | ICD-10-CM | POA: Insufficient documentation

## 2012-10-26 DIAGNOSIS — Z8742 Personal history of other diseases of the female genital tract: Secondary | ICD-10-CM | POA: Insufficient documentation

## 2012-10-26 DIAGNOSIS — Z79899 Other long term (current) drug therapy: Secondary | ICD-10-CM | POA: Insufficient documentation

## 2012-10-26 DIAGNOSIS — Z8639 Personal history of other endocrine, nutritional and metabolic disease: Secondary | ICD-10-CM | POA: Insufficient documentation

## 2012-10-26 LAB — COMPREHENSIVE METABOLIC PANEL
ALT: 10 U/L (ref 0–35)
BUN: 10 mg/dL (ref 6–23)
CO2: 23 mEq/L (ref 19–32)
Calcium: 8.4 mg/dL (ref 8.4–10.5)
Creatinine, Ser: 0.72 mg/dL (ref 0.50–1.10)
GFR calc Af Amer: >90 mL/min (ref >90)
GFR calc non Af Amer: >90 mL/min (ref >90)
Glucose, Bld: 127 mg/dL — ABNORMAL HIGH (ref 70–99)
Sodium: 136 mEq/L (ref 135–145)
Total Protein: 6.2 g/dL (ref 6.0–8.3)

## 2012-10-26 LAB — CBC WITH DIFFERENTIAL/PLATELET
Eosinophils Absolute: 0.1 10*3/uL (ref 0.0–0.7)
Eosinophils Relative: 1 % (ref 0–5)
HCT: 38.8 % (ref 36.0–46.0)
Lymphocytes Relative: 19 % (ref 12–46)
Lymphs Abs: 1.2 10*3/uL (ref 0.7–4.0)
MCH: 24.2 pg — ABNORMAL LOW (ref 26.0–34.0)
MCV: 76.2 fL — ABNORMAL LOW (ref 78.0–100.0)
Monocytes Absolute: 0.9 10*3/uL (ref 0.1–1.0)
Monocytes Relative: 14 % — ABNORMAL HIGH (ref 3–12)
RBC: 5.09 MIL/uL (ref 3.87–5.11)
WBC: 6.3 10*3/uL (ref 4.0–10.5)

## 2012-10-26 LAB — MAGNESIUM: Magnesium: 1.7 mg/dL (ref 1.5–2.5)

## 2012-10-26 LAB — PHOSPHORUS: Phosphorus: 2.2 mg/dL — ABNORMAL LOW (ref 2.3–4.6)

## 2012-10-26 NOTE — ED Provider Notes (Addendum)
History     CSN: [id number]  Arrival date & time 10/26/12  0424   First MD Initiated Contact with Patient 10/26/12 0441      Chief Complaint  Patient presents with  . Palpitations    (Consider location/radiation/quality/duration/timing/severity/associated sxs/prior treatment) HPI Comments: Pt states this has happened in the past due to abnormalities with K, Mg and Phosphorus.  She has been on a suppliment since but unclear why this is the case.  No etoh use and not on loop diuretics.  Pt denies any recent med or diet changes.  She states yesterday she was having some irregular beats and took of K instead of 40 but just wants to be checked.  Now she is assymptomatic.  Patient is a 52 y.o. female presenting with palpitations. The history is provided by the patient.  Palpitations  This is a recurrent problem. The current episode started 1 to 2 hours ago. The problem occurs constantly. The problem has been resolved. Associated with: woke her from sleep however she noticed yesterday some irregular beats. Associated symptoms include irregular heartbeat. Pertinent negatives include no diaphoresis, no fever, no chest pain, no chest pressure, no near-syncope, no syncope, no abdominal pain, no nausea, no vomiting, no lower extremity edema, no cough and no shortness of breath. Treatments tried: took 2 metoprolol. The treatment provided significant relief. Her past medical history is significant for valve disorder. Her past medical history does not include anemia.    Past Medical History  Diagnosis Date  . Mitral valve prolapse   . Rapid heart beat     States "irreg heart beat"  . Hemorrhoids   . Anemia   . GERD (gastroesophageal reflux disease)   . Positive PPD   . Thyroid disease   . Genital warts   . Hx of cardiac catheterization 2009    normal coronary arteries  . Hx of menorrhagia     Novasure  . Normal cardiac stress test 03/09/2012    no ischemia    Past Surgical History   Procedure Laterality Date  . Novasure ablation    . Cardiac catheterization    . Upper gastrointestinal endoscopy  May 2013    Family History  Problem Relation Age of Onset  . Heart disease Father     died Mi 60  . Hypertension Maternal Grandfather   . Diabetes Maternal Grandfather   . Heart disease Maternal Grandfather   . Colon cancer Maternal Grandfather   . Diabetes Maternal Grandmother     History  Substance Use Topics  . Smoking status: Never Smoker   . Smokeless tobacco: Never Used  . Alcohol Use: No    OB History   Grav Para Term Preterm Abortions TAB SAB Ect Mult Living                  Review of Systems  Constitutional: Negative for fever and diaphoresis.  Respiratory: Negative for cough and shortness of breath.   Cardiovascular: Positive for palpitations. Negative for chest pain, leg swelling, syncope and near-syncope.  Gastrointestinal: Negative for nausea, vomiting and abdominal pain.  All other systems reviewed and are negative.    Allergies  Ceftriaxone sodium and Doxycycline  Home Medications   Current Outpatient Rx  Name  Route  Sig  Dispense  Refill  . calcium-vitamin D (OSCAL WITH D) 500-200 MG-UNIT per tablet   Oral   Take 1 tablet by mouth daily.         . cholecalciferol (VITAMIN D)  1000 UNITS tablet   Oral   Take 2,000 Units by mouth daily.          . metoprolol tartrate (LOPRESSOR) 25 MG tablet   Oral   Take 12.5 mg by mouth 2 (two) times daily.         . pantoprazole (PROTONIX) 40 MG tablet   Oral   Take 40 mg by mouth daily.         [name] PARoxetine (PAXIL-CR) 25 MG 24 hr tablet   Oral   Take 25 mg by mouth every morning.         . potassium chloride SA (K-DUR,KLOR-CON) 20 MEQ tablet   Oral   Take 1 tablet (20 mEq total) by mouth 2 (two) times daily.   60 tablet   0     BP 116/65  Pulse 87  Temp(Src) 98.8 F (37.1 C) (Oral)  Resp 14  SpO2 100%  Physical Exam  Nursing note and vitals  reviewed. Constitutional: She is oriented to person, place, and time. She appears well-developed and well-nourished. No distress.  HENT:  Head: Normocephalic and atraumatic.  Mouth/Throat: Oropharynx is clear and moist.  Eyes: Conjunctivae and EOM are normal. Pupils are equal, round, and reactive to light.  Neck: Normal range of motion. Neck supple.  Cardiovascular: Normal rate, regular rhythm and intact distal pulses.   No murmur heard. Pulmonary/Chest: Effort normal and breath sounds normal. No respiratory distress. She has no wheezes. She has no rales.  Abdominal: Soft. She exhibits no distension. There is no tenderness. There is no rebound and no guarding.  Musculoskeletal: Normal range of motion. She exhibits no edema and no tenderness.  Neurological: She is alert and oriented to person, place, and time.  Skin: Skin is warm and dry. No rash noted. No erythema.  Psychiatric: She has a normal mood and affect. Her behavior is normal.    ED Course  Procedures (including critical care time)  Labs Reviewed  CBC WITH DIFFERENTIAL - Abnormal; Notable for the following:    MCV 76.2 (*)    MCH 24.2 (*)    Monocytes Relative 14 (*)    All other components within normal limits  COMPREHENSIVE METABOLIC PANEL - Abnormal; Notable for the following:    Glucose, Bld 127 (*)    Albumin 3.0 (*)    All other components within normal limits  PHOSPHORUS - Abnormal; Notable for the following:    Phosphorus 2.2 (*)    All other components within normal limits  MAGNESIUM   No results found.   Date: 10/26/2012  Rate: 89  Rhythm: normal sinus rhythm  QRS Axis: normal  Intervals: normal  ST/T Wave abnormalities: normal  Conduction Disutrbances: none  Narrative Interpretation: unremarkable      1. Palpitation       MDM   Patient with a prior history of palpitations with a history of thyroid disease, mitral valve prolapse and SVT who states yesterday and felt like her heart was  skipping however tonight she woke up and her heart was racing to where she could not count the beats because they were too fast. She took 2 metoprolol and on arrival here her heart rate has normalized and she is now in a sinus rhythm. She states in the past when this occurred it was because her potassium, phosphorus and magnesium were all low. She is has been taking supplements since that time but thinks there may be something wrong causing the palpitations today. She denies any  chest pain or shortness of breath and had a normal catheterization within the last 5 years. Patient is well-appearing on exam with normal blood pressure and heart rate. She has no focal findings. CMP, magnesium and phosphorus pending.  EKG within normal limits.  5:37 AM All labs wnl.  No other signs of dysrhythmia.  Will d/c home.     Gwyneth Sprout, MD 10/26/12 1308  Gwyneth Sprout, MD 10/26/12 [phone]

## 2012-10-26 NOTE — ED Notes (Signed)
Pt reported that she took Metoprolol 25 mg 2 tablets prior to EMS' arrival at her home.

## 2012-10-26 NOTE — ED Notes (Signed)
Brought in by EMS from home with c/o "heart racing". Per EMS, pt woke with "racing heart" and took beta-blocker(Metoprolo) as prescribed, pt thinks "something wrong with her potassium" and wanted to be checked--- pt thought of driving self to ED but decided to call EMS when she saw the road "icy"; pt arrived to ED room A/Ox4, in no s/s apparent distress, per EMS: pt on her phone all the way to ED.

## 2012-10-26 NOTE — ED Notes (Signed)
[id number]<AV> Expected date:<BR> Expected time:<BR> Means of arrival:<BR> Comments:<BR> EMS/52 yo with hx &quot;racing heart&quot; due to calcium def-took 2 metoprolol prior to EMS arrival

## 2012-10-31 ENCOUNTER — Other Ambulatory Visit: Payer: Self-pay | Admitting: Internal Medicine

## 2012-11-23 ENCOUNTER — Encounter: Payer: Self-pay | Admitting: Family Medicine

## 2012-11-23 ENCOUNTER — Ambulatory Visit (INDEPENDENT_AMBULATORY_CARE_PROVIDER_SITE_OTHER): Payer: BC Managed Care – PPO | Admitting: Family Medicine

## 2012-11-23 VITALS — BP 124/80 | Temp 98.4°F | Wt 202.0 lb

## 2012-11-23 DIAGNOSIS — T148XXA Other injury of unspecified body region, initial encounter: Secondary | ICD-10-CM

## 2012-11-23 NOTE — Progress Notes (Signed)
Chief Complaint  Patient presents with  . Neck Pain    HPI:  Acute visit for neck pain: -has chronic neck pain issues on and off -pain in L shoulder and neck - moderate -flare for about 2 weeks -she has been working more and under more stress - lots of arm and shoulder work with dialysis and lifting -has contour pillow -worse with lifting and turning head in certain ways -better with rest -has tried heat and ibuprofen which helps -Denies: fevers, HA, vision changes, weakness or numbness in UEs  ROS: See pertinent positives and negatives per HPI.  Past Medical History  Diagnosis Date  . Mitral valve prolapse   . Rapid heart beat     States "irreg heart beat"  . Hemorrhoids   . Anemia   . GERD (gastroesophageal reflux disease)   . Positive PPD   . Thyroid disease   . Genital warts   . Hx of cardiac catheterization 2009    normal coronary arteries  . Hx of menorrhagia     Novasure  . Normal cardiac stress test 03/09/2012    no ischemia    Family History  Problem Relation Age of Onset  . Heart disease Father     died Mi 51  . Hypertension Maternal Grandfather   . Diabetes Maternal Grandfather   . Heart disease Maternal Grandfather   . Colon cancer Maternal Grandfather   . Diabetes Maternal Grandmother     History   Social History  . Marital Status: Divorced    Spouse Name: N/A    Number of Children: 2  . Years of Education: 16   Occupational History  . dialysis tech    Social History Main Topics  . Smoking status: Never Smoker   . Smokeless tobacco: Never Used  . Alcohol Use: No  . Drug Use: No  . Sexually Active: None   Other Topics Concern  . None   Social History Narrative   Ms. Earnhart is a divorced [place] American female who works as a Teacher, early years/pre who has a number of specialists but no primary care physician. She lived in [place] and in [place] for a number of years her mom is from [place]. 16 years of education  went to college at [employer] lives at home with her son who is in his 64s no pets   Neg ets tob etoh hx PA   6 hours of sleep   G2P2    TD2010  colonoscopy 2009             Current outpatient prescriptions:calcium-vitamin D (OSCAL WITH D) 500-200 MG-UNIT per tablet, Take 1 tablet by mouth every morning. , Disp: , Rfl: ;  cholecalciferol (VITAMIN D) 1000 UNITS tablet, Take 2,000 Units by mouth every morning. , Disp: , Rfl: ;  KLOR-CON M20 20 MEQ tablet, TAKE 1 TABLET BY MOUTH 2 TIMES DAILY., Disp: 60 tablet, Rfl: 2;  metoprolol tartrate (LOPRESSOR) 25 MG tablet, Take 25 mg by mouth every morning. , Disp: , Rfl:  pantoprazole (PROTONIX) 40 MG tablet, Take 40 mg by mouth daily as needed (for heartburn). , Disp: , Rfl: ;  PARoxetine (PAXIL-CR) 25 MG 24 hr tablet, Take 25 mg by mouth every morning. , Disp: , Rfl:   EXAM:  Filed Vitals:   11/23/12 1544  BP: 124/80  Temp: 98.4 F (36.9 C)    Body mass index is 32.62 kg/(m^2).  GENERAL: vitals reviewed and listed above, alert, oriented, appears well  hydrated and in no acute distress  HEENT: atraumatic, conjunttiva clear, no obvious abnormalities on inspection of external nose and ears  NECK: no obvious masses on inspection  LUNGS: clear to auscultation bilaterally, no wheezes, rales or rhonchi, good air movement  CV: HRRR, no peripheral edema  MS: moves all extremities without noticeable abnormality -normal ROM, sensation and muscle strength in upper extremities bilaterally -slightly decreased ROM and neck rotation to R and L -TTP and muscle tension in traps bilat L > R -spurling normal, no bony TTP -head and shoulders forward posture  PSYCH: pleasant and cooperative, no obvious depression or anxiety  ASSESSMENT AND PLAN:  Discussed the following assessment and plan:  Muscle strain - Plan: Ambulatory referral to Physical Therapy  HEP Posture exercises PT referral  Supportive recs Follow up with PCP in 1 month -Patient advised  to return or notify a doctor immediately if symptoms worsen or persist or new concerns arise.  There are no Patient Instructions on file for this visit.   Kriste Basque R.

## 2012-12-05 ENCOUNTER — Ambulatory Visit: Payer: BC Managed Care – PPO | Attending: Family Medicine | Admitting: Physical Therapy

## 2012-12-05 DIAGNOSIS — IMO0001 Reserved for inherently not codable concepts without codable children: Secondary | ICD-10-CM | POA: Insufficient documentation

## 2012-12-05 DIAGNOSIS — M542 Cervicalgia: Secondary | ICD-10-CM | POA: Insufficient documentation

## 2012-12-13 ENCOUNTER — Ambulatory Visit: Payer: BC Managed Care – PPO | Attending: Family Medicine | Admitting: Physical Therapy

## 2012-12-13 DIAGNOSIS — IMO0001 Reserved for inherently not codable concepts without codable children: Secondary | ICD-10-CM | POA: Insufficient documentation

## 2012-12-13 DIAGNOSIS — M542 Cervicalgia: Secondary | ICD-10-CM | POA: Insufficient documentation

## 2012-12-14 ENCOUNTER — Ambulatory Visit: Payer: BC Managed Care – PPO | Admitting: Physical Therapy

## 2012-12-14 ENCOUNTER — Emergency Department (HOSPITAL_COMMUNITY): Payer: BC Managed Care – PPO

## 2012-12-14 ENCOUNTER — Emergency Department (HOSPITAL_COMMUNITY)
Admission: EM | Admit: 2012-12-14 | Discharge: 2012-12-14 | Disposition: A | Discharge status: Home / Self Care | Payer: BC Managed Care – PPO | Attending: Emergency Medicine | Admitting: Emergency Medicine

## 2012-12-14 ENCOUNTER — Encounter (HOSPITAL_COMMUNITY): Payer: Self-pay | Admitting: Emergency Medicine

## 2012-12-14 DIAGNOSIS — Z8742 Personal history of other diseases of the female genital tract: Secondary | ICD-10-CM | POA: Insufficient documentation

## 2012-12-14 DIAGNOSIS — Z8619 Personal history of other infectious and parasitic diseases: Secondary | ICD-10-CM | POA: Insufficient documentation

## 2012-12-14 DIAGNOSIS — Z79899 Other long term (current) drug therapy: Secondary | ICD-10-CM | POA: Insufficient documentation

## 2012-12-14 DIAGNOSIS — R0602 Shortness of breath: Secondary | ICD-10-CM | POA: Insufficient documentation

## 2012-12-14 DIAGNOSIS — I471 Supraventricular tachycardia, unspecified: Secondary | ICD-10-CM | POA: Insufficient documentation

## 2012-12-14 DIAGNOSIS — K219 Gastro-esophageal reflux disease without esophagitis: Secondary | ICD-10-CM | POA: Insufficient documentation

## 2012-12-14 DIAGNOSIS — Z862 Personal history of diseases of the blood and blood-forming organs and certain disorders involving the immune mechanism: Secondary | ICD-10-CM | POA: Insufficient documentation

## 2012-12-14 DIAGNOSIS — Z9861 Coronary angioplasty status: Secondary | ICD-10-CM | POA: Insufficient documentation

## 2012-12-14 DIAGNOSIS — Z7982 Long term (current) use of aspirin: Secondary | ICD-10-CM | POA: Insufficient documentation

## 2012-12-14 DIAGNOSIS — R000 Tachycardia, unspecified: Secondary | ICD-10-CM | POA: Insufficient documentation

## 2012-12-14 DIAGNOSIS — Z8679 Personal history of other diseases of the circulatory system: Secondary | ICD-10-CM | POA: Insufficient documentation

## 2012-12-14 DIAGNOSIS — Z8639 Personal history of other endocrine, nutritional and metabolic disease: Secondary | ICD-10-CM | POA: Insufficient documentation

## 2012-12-14 HISTORY — DX: Hypokalemia: E87.6

## 2012-12-14 LAB — URINALYSIS, ROUTINE W REFLEX MICROSCOPIC
Bilirubin Urine: NEGATIVE
Glucose, UA: NEGATIVE mg/dL
Hgb urine dipstick: NEGATIVE
Ketones, ur: NEGATIVE mg/dL
Nitrite: NEGATIVE
Protein, ur: NEGATIVE mg/dL
Specific Gravity, Urine: 1.016 (ref 1.005–1.030)
Urobilinogen, UA: 0.2 mg/dL (ref 0.0–1.0)
pH: 7.5 (ref 5.0–8.0)

## 2012-12-14 LAB — CBC WITH DIFFERENTIAL/PLATELET
Basophils Absolute: 0 10*3/uL (ref 0.0–0.1)
Basophils Relative: 0 % (ref 0–1)
Eosinophils Absolute: 0.1 10*3/uL (ref 0.0–0.7)
Eosinophils Relative: 1 % (ref 0–5)
HCT: 37.1 % (ref 36.0–46.0)
Hemoglobin: 12.1 g/dL (ref 12.0–15.0)
Lymphocytes Relative: 16 % (ref 12–46)
Lymphs Abs: 1.3 10*3/uL (ref 0.7–4.0)
MCH: 24.3 pg — ABNORMAL LOW (ref 26.0–34.0)
MCHC: 32.6 g/dL (ref 30.0–36.0)
MCV: 74.6 fL — ABNORMAL LOW (ref 78.0–100.0)
Monocytes Absolute: 1 10*3/uL (ref 0.1–1.0)
Monocytes Relative: 13 % — ABNORMAL HIGH (ref 3–12)
Neutro Abs: 5.6 10*3/uL (ref 1.7–7.7)
Neutrophils Relative %: 70 % (ref 43–77)
Platelets: 195 10*3/uL (ref 150–400)
RBC: 4.97 MIL/uL (ref 3.87–5.11)
RDW: 14.4 % (ref 11.5–15.5)
WBC: 8 10*3/uL (ref 4.0–10.5)

## 2012-12-14 LAB — BASIC METABOLIC PANEL
BUN: 11 mg/dL (ref 6–23)
CO2: 24 mEq/L (ref 19–32)
Calcium: 8.4 mg/dL (ref 8.4–10.5)
Chloride: 103 mEq/L (ref 96–112)
Creatinine, Ser: 0.73 mg/dL (ref 0.50–1.10)
GFR calc Af Amer: >90 mL/min (ref >90)
GFR calc non Af Amer: >90 mL/min (ref >90)
Glucose, Bld: 112 mg/dL — ABNORMAL HIGH (ref 70–99)
Potassium: 3.4 mEq/L — ABNORMAL LOW (ref 3.5–5.1)
Sodium: 136 mEq/L (ref 135–145)

## 2012-12-14 LAB — URINE MICROSCOPIC-ADD ON

## 2012-12-14 NOTE — ED Provider Notes (Signed)
History     CSN: [id number]  Arrival date & time 12/14/12  4098   First MD Initiated Contact with Patient 12/14/12 [phone]      Chief Complaint  Patient presents with  . Rapid Heart Rate     (Consider location/radiation/quality/duration/timing/severity/associated sxs/prior treatment) HPI [name] is a 52 year old female with a past history of hypokalemia, tachycardia, and premature heartbeats who presents to the ED for tachycardia. She reports having had hypokalemia and an irregular heartbeat since September 2013. She states this episode was similar to her initial episode. She felt her heart "skipping beats" last night so she took some extra potassium (20 mEq). She reports seeing a nephrologist 1 month ago, and he told her to drop her potassium from 40 mEq/day to 20 mEq/day. She reports that her kidneys were functionally fine according to the nephrologist. She believes this is why she is having palpitations. She woke up at 2:30 am this morning with tachycardia so she took her metoprolol pill and another potassium 20 mEq. She called EMS, and states her HR was between 130-160 on the truck. She reports the paramedic had her do vagal maneuvers, but she states these did not help with her HR. She reports the palpitations and tachycardia was associated with SOB. While checking in to the ED her HR was in the 90s and while interviewing her it was in the 80s. She denies chest pain, nausea, vomiting, diarrhea, and constipation. She states she has had some cramping of her foot. She denies any new medications. She does not take diuretics.  Past Medical History  Diagnosis Date  . Mitral valve prolapse   . Rapid heart beat     States "irreg heart beat"  . Hemorrhoids   . Anemia   . GERD (gastroesophageal reflux disease)   . Positive PPD   . Thyroid disease   . Genital warts   . Hx of cardiac catheterization 2009    normal coronary arteries  . Hx of menorrhagia     Novasure  . Normal cardiac stress  test 03/09/2012    no ischemia  . Hypokalemia     Past Surgical History  Procedure Laterality Date  . Novasure ablation    . Cardiac catheterization    . Upper gastrointestinal endoscopy  May 2013    Family History  Problem Relation Age of Onset  . Heart disease Father     died Mi 53  . Hypertension Maternal Grandfather   . Diabetes Maternal Grandfather   . Heart disease Maternal Grandfather   . Colon cancer Maternal Grandfather   . Diabetes Maternal Grandmother     History  Substance Use Topics  . Smoking status: Never Smoker   . Smokeless tobacco: Never Used  . Alcohol Use: No    OB History   Grav Para Term Preterm Abortions TAB SAB Ect Mult Living                  Review of Systems All other systems negative except as documented in the HPI. All pertinent positives and negatives as reviewed in the HPI.  Allergies  Ceftriaxone sodium and Doxycycline  Home Medications   Current Outpatient Rx  Name  Route  Sig  Dispense  Refill  . aspirin 81 MG chewable tablet   Oral   Chew 81 mg by mouth daily.         . calcium-vitamin D (OSCAL WITH D) 500-200 MG-UNIT per tablet   Oral   Take  1 tablet by mouth every morning.          . cholecalciferol (VITAMIN D) 1000 UNITS tablet   Oral   Take 2,000 Units by mouth every morning.          . metoprolol tartrate (LOPRESSOR) 25 MG tablet   Oral   Take 25 mg by mouth every morning.          . Multiple Vitamin (MULTIVITAMIN WITH MINERALS) TABS   Oral   Take 1 tablet by mouth daily.         . pantoprazole (PROTONIX) 40 MG tablet   Oral   Take 40 mg by mouth daily as needed (for heartburn).          [name] PARoxetine (PAXIL-CR) 25 MG 24 hr tablet   Oral   Take 25 mg by mouth every morning.          . potassium chloride SA (K-DUR,KLOR-CON) 20 MEQ tablet   Oral   Take 20 mEq by mouth daily.           BP 122/75  Pulse 97  Temp(Src) 98.2 F (36.8 C) (Oral)  Resp 18  SpO2 100%  LMP  11/25/2012  Physical Exam  Constitutional: She is oriented to person, place, and time. She appears well-developed and well-nourished.  HENT:  Head: Normocephalic and atraumatic.  Neck: Normal range of motion. Neck supple.  Cardiovascular: Normal rate and regular rhythm.   Neurological: She is alert and oriented to person, place, and time.  Skin: Skin is warm and dry.    ED Course  Procedures (including critical care time)  Labs Reviewed  CBC WITH DIFFERENTIAL - Abnormal; Notable for the following:    MCV 74.6 (*)    MCH 24.3 (*)    All other components within normal limits  BASIC METABOLIC PANEL  URINALYSIS, ROUTINE W REFLEX MICROSCOPIC   Dg Chest 2 View  12/14/2012  *RADIOLOGY REPORT*  Clinical Data: Heart palpitations.  CHEST - 2 VIEW  Comparison: 09/05/2012  Findings: Mild hyperinflation. The heart size and pulmonary vascularity are normal. The lungs appear clear and expanded without focal air space disease or consolidation. No blunting of the costophrenic angles. No pneumothorax.  Mediastinal contours appear intact.  No significant change since previous study.  IMPRESSION: No evidence of active pulmonary disease.   Original Report Authenticated By: Burman Nieves, M.D.     Patient's heart rate, has been normal during her visit here in the emergency department is maintained in the 80s and upper 70's.  Patient is resting comfortably in the room and she is advised followup with cardiology for further evaluation.  Told to return here for any worsening in her condition.  Patient is concerned about her potassium level and at this time.  Her level is 3.4        MDM  MDM Reviewed: vitals, nursing note and previous chart Reviewed previous: labs, x-ray and ECG Interpretation: labs, ECG and x-ray    Date: 12/14/2012  Rate: 70  Rhythm: normal sinus rhythm  QRS Axis: normal  Intervals: normal  ST/T Wave abnormalities: normal  Conduction Disutrbances:none  Narrative  Interpretation:   Old EKG Reviewed: unchanged            Carlyle Dolly, PA-C 12/14/12 [phone]

## 2012-12-14 NOTE — ED Notes (Signed)
Per EMS, pt has hx of hypokalemia and arrythmia.  Pt stated she took "extra potassium" because she was not feeling well.  Pt HR 140, stated she will go into SVT and need adenosine in the past.  EMS stated 168 highest, vagal down and went back down to 120.  Denies pain, anxious.

## 2012-12-14 NOTE — ED Provider Notes (Signed)
Medical screening examination/treatment/procedure(s) were performed by non-physician practitioner and as supervising physician I was immediately available for consultation/collaboration.  Lyanne Co, MD 12/14/12 [phone]

## 2012-12-14 NOTE — ED Notes (Signed)
EKG given to PA, Lawyer. 

## 2012-12-14 NOTE — ED Notes (Signed)
[id number]<JY> Expected date:<BR> Expected time:<BR> Means of arrival:<BR> Comments:<BR> EMS/&quot;felt like heart racing&quot;-EMS states HR 140-hx of same

## 2013-01-03 ENCOUNTER — Other Ambulatory Visit: Payer: Self-pay

## 2013-01-03 DIAGNOSIS — Z1231 Encounter for screening mammogram for malignant neoplasm of breast: Secondary | ICD-10-CM

## 2013-01-04 ENCOUNTER — Encounter (HOSPITAL_COMMUNITY): Payer: Self-pay | Admitting: *Deleted

## 2013-01-04 ENCOUNTER — Emergency Department (HOSPITAL_COMMUNITY)
Admission: EM | Admit: 2013-01-04 | Discharge: 2013-01-04 | Disposition: A | Discharge status: Home / Self Care | Payer: BC Managed Care – PPO | Attending: Emergency Medicine | Admitting: Emergency Medicine

## 2013-01-04 DIAGNOSIS — G479 Sleep disorder, unspecified: Secondary | ICD-10-CM | POA: Insufficient documentation

## 2013-01-04 DIAGNOSIS — Z8639 Personal history of other endocrine, nutritional and metabolic disease: Secondary | ICD-10-CM | POA: Insufficient documentation

## 2013-01-04 DIAGNOSIS — Z862 Personal history of diseases of the blood and blood-forming organs and certain disorders involving the immune mechanism: Secondary | ICD-10-CM | POA: Insufficient documentation

## 2013-01-04 DIAGNOSIS — Z8679 Personal history of other diseases of the circulatory system: Secondary | ICD-10-CM | POA: Insufficient documentation

## 2013-01-04 DIAGNOSIS — K219 Gastro-esophageal reflux disease without esophagitis: Secondary | ICD-10-CM | POA: Insufficient documentation

## 2013-01-04 DIAGNOSIS — Z79899 Other long term (current) drug therapy: Secondary | ICD-10-CM | POA: Insufficient documentation

## 2013-01-04 DIAGNOSIS — Z8742 Personal history of other diseases of the female genital tract: Secondary | ICD-10-CM | POA: Insufficient documentation

## 2013-01-04 DIAGNOSIS — R42 Dizziness and giddiness: Secondary | ICD-10-CM | POA: Insufficient documentation

## 2013-01-04 DIAGNOSIS — Z9861 Coronary angioplasty status: Secondary | ICD-10-CM | POA: Insufficient documentation

## 2013-01-04 DIAGNOSIS — J029 Acute pharyngitis, unspecified: Secondary | ICD-10-CM | POA: Insufficient documentation

## 2013-01-04 DIAGNOSIS — J3489 Other specified disorders of nose and nasal sinuses: Secondary | ICD-10-CM | POA: Insufficient documentation

## 2013-01-04 DIAGNOSIS — R05 Cough: Secondary | ICD-10-CM

## 2013-01-04 DIAGNOSIS — J069 Acute upper respiratory infection, unspecified: Secondary | ICD-10-CM | POA: Insufficient documentation

## 2013-01-04 DIAGNOSIS — R059 Cough, unspecified: Secondary | ICD-10-CM | POA: Insufficient documentation

## 2013-01-04 MED ORDER — IBUPROFEN 600 MG PO TABS: 600.0000 mg | ORAL_TABLET | Freq: Four times a day (QID) | ORAL | Status: DC | PRN

## 2013-01-04 MED ORDER — GUAIFENESIN ER 600 MG PO TB12: 600.0000 mg | ORAL_TABLET | Freq: Two times a day (BID) | ORAL | Status: DC

## 2013-01-04 MED ORDER — GUAIFENESIN ER 600 MG PO TB12
1200.0000 mg | ORAL_TABLET | Freq: Two times a day (BID) | ORAL | Status: DC
  Administered 2013-01-04: 1200 mg via ORAL
  Filled 2013-01-04: qty 2

## 2013-01-04 MED ORDER — OXYMETAZOLINE HCL 0.05 % NA SOLN
1.0000 | Freq: Two times a day (BID) | NASAL | Status: DC
  Administered 2013-01-04: 1 via NASAL
  Filled 2013-01-04: qty 15

## 2013-01-04 MED ORDER — BENZONATATE 100 MG PO CAPS
200.0000 mg | ORAL_CAPSULE | Freq: Once | ORAL | Status: AC
  Administered 2013-01-04: 200 mg via ORAL
  Filled 2013-01-04: qty 2

## 2013-01-04 MED ORDER — IBUPROFEN 200 MG PO TABS
600.0000 mg | ORAL_TABLET | Freq: Once | ORAL | Status: AC
  Administered 2013-01-04: 600 mg via ORAL
  Filled 2013-01-04: qty 3

## 2013-01-04 MED ORDER — BENZONATATE 100 MG PO CAPS: 200.0000 mg | ORAL_CAPSULE | Freq: Three times a day (TID) | ORAL | Status: DC

## 2013-01-04 NOTE — ED Notes (Signed)
rx x 3 given

## 2013-01-04 NOTE — ED Provider Notes (Signed)
History     CSN: [id number]  Arrival date & time 01/04/13  0231   First MD Initiated Contact with Patient 01/04/13 [phone]      Chief Complaint  Patient presents with  . URI    [name] is a 52 y.o. female  HPI chief complaint of URI symptoms including rhinorrhea, congestion, mild sore throat and productive cough that was productive for the first 2 days and has since been a dry cough presents to the ER. Patient is having troubles sleeping with her cough, he says it severe it has not responded to over-the-counter medications.  No sick contacts, no fever, no chills, no chest pain, shortness of breath. Patient had one episode of transient dizziness which self resolved and has not recurred yesterday.  Past Medical History  Diagnosis Date  . Mitral valve prolapse   . Rapid heart beat     States "irreg heart beat"  . Hemorrhoids   . Anemia   . GERD (gastroesophageal reflux disease)   . Positive PPD   . Thyroid disease   . Genital warts   . Hx of cardiac catheterization 2009    normal coronary arteries  . Hx of menorrhagia     Novasure  . Normal cardiac stress test 03/09/2012    no ischemia  . Hypokalemia     Past Surgical History  Procedure Laterality Date  . Novasure ablation    . Cardiac catheterization    . Upper gastrointestinal endoscopy  May 2013    Family History  Problem Relation Age of Onset  . Heart disease Father     died Mi 38  . Hypertension Maternal Grandfather   . Diabetes Maternal Grandfather   . Heart disease Maternal Grandfather   . Colon cancer Maternal Grandfather   . Diabetes Maternal Grandmother     History  Substance Use Topics  . Smoking status: Never Smoker   . Smokeless tobacco: Never Used  . Alcohol Use: No    OB History   Grav Para Term Preterm Abortions TAB SAB Ect Mult Living                  Review of Systems At least 10pt or greater review of systems completed and are negative except where specified in the  HPI.  Allergies  Ceftriaxone sodium and Doxycycline  Home Medications   Current Outpatient Rx  Name  Route  Sig  Dispense  Refill  . cholecalciferol (VITAMIN D) 1000 UNITS tablet   Oral   Take 2,000 Units by mouth every morning.          . metoprolol tartrate (LOPRESSOR) 25 MG tablet   Oral   Take 25 mg by mouth 2 (two) times daily.          . Multiple Vitamin (MULTIVITAMIN WITH MINERALS) TABS   Oral   Take 1 tablet by mouth daily.         [name] PARoxetine (PAXIL-CR) 25 MG 24 hr tablet   Oral   Take 25 mg by mouth every morning.          . potassium chloride SA (K-DUR,KLOR-CON) 20 MEQ tablet   Oral   Take 20 mEq by mouth 2 (two) times daily.          . calcium-vitamin D (OSCAL WITH D) 500-200 MG-UNIT per tablet   Oral   Take 1 tablet by mouth every morning.          . pantoprazole (PROTONIX) 40 MG  tablet   Oral   Take 40 mg by mouth daily as needed (for heartburn).            BP 102/81  Pulse 88  Temp(Src) 98.5 F (36.9 C) (Oral)  Resp 20  Ht 5\' 6"  (1.676 m)  Wt 200 lb (90.719 kg)  BMI 32.3 kg/m2  SpO2 99%  LMP 11/25/2012  Physical Exam  Nursing notes reviewed.  Electronic medical record reviewed. VITAL SIGNS:   Filed Vitals:   01/04/13 0241  BP: 102/81  Pulse: 88  Temp: 98.5 F (36.9 C)  TempSrc: Oral  Resp: 20  Height: 5\' 6"  (1.676 m)  Weight: 200 lb (90.719 kg)  SpO2: 99%   CONSTITUTIONAL: Awake, oriented, appears non-toxic HENT: Atraumatic, normocephalic, oral mucosa pink and moist, airway patent. Nares patent without drainage. External ears normal. EYES: Conjunctiva clear, EOMI, PERRLA NECK: Trachea midline, non-tender, supple CARDIOVASCULAR: Normal heart rate, Normal rhythm, No murmurs, rubs, gallops PULMONARY/CHEST: Clear to auscultation, no rhonchi, wheezes, or rales. Symmetrical breath sounds. Non-tender. ABDOMINAL: Non-distended, soft, non-tender - no rebound or guarding.  BS normal. NEUROLOGIC: Non-focal, moving all four  extremities, no gross sensory or motor deficits. EXTREMITIES: No clubbing, cyanosis, or edema SKIN: Warm, Dry, No erythema, No rash  ED Course  Procedures (including critical care time)  Labs Reviewed - No data to display No results found.   1. URI (upper respiratory infection)   2. Cough    Medications  benzonatate (TESSALON) capsule 200 mg (200 mg Oral Given 01/04/13 0438)  ibuprofen (ADVIL,MOTRIN) tablet 600 mg (600 mg Oral Given 01/04/13 0438)      MDM  Nonsmoking female presenting with upper respiratory symptoms, dry cough, lungs are clear bilaterally. I do not think this patient has a pneumonia, she is afebrile, nontoxic, vital signs are stable and within normal limits. Do not think labs or imaging is indicated at this time. We'll treat the patient's symptoms.  I explained the diagnosis and have given explicit precautions to return to the ER including any other new or worsening symptoms. The patient understands and accepts the medical plan as it's been dictated and I have answered their questions. Discharge instructions concerning home care and prescriptions have been given.  The patient is STABLE and is discharged to home in good condition.         Jones Skene, MD 01/04/13 2222

## 2013-01-04 NOTE — ED Notes (Signed)
Pt states since last Friday, cold symptoms, has tried OTC meds without relief. Pt states mucus is now green colored

## 2013-01-10 ENCOUNTER — Encounter (HOSPITAL_COMMUNITY): Payer: Self-pay | Admitting: *Deleted

## 2013-01-10 ENCOUNTER — Emergency Department (HOSPITAL_COMMUNITY)
Admission: EM | Admit: 2013-01-10 | Discharge: 2013-01-10 | Disposition: A | Discharge status: Home / Self Care | Payer: BC Managed Care – PPO | Source: Home / Self Care | Attending: Emergency Medicine | Admitting: Emergency Medicine

## 2013-01-10 DIAGNOSIS — J309 Allergic rhinitis, unspecified: Secondary | ICD-10-CM

## 2013-01-10 MED ORDER — FLUTICASONE PROPIONATE 50 MCG/ACT NA SUSP: 2.0000 | Freq: Every day | NASAL | Status: DC

## 2013-01-10 NOTE — ED Provider Notes (Signed)
History     CSN: [id number]  Arrival date & time 01/10/13  1308   First MD Initiated Contact with Patient 01/10/13 2010      Chief Complaint  Patient presents with  . Laryngitis    (Consider location/radiation/quality/duration/timing/severity/associated sxs/prior treatment) HPI Comments: Pt reports having a cold for 2.5 weeks, initially much worse with sore throat, congestion, green sputum/cough. Improved about a week ago, but throat irritation, runny nose (clear drainage) and hoarse voice persist.  Pt doesn't feel ill/sick, but has sx that aren't going away.   Patient is a 52 y.o. female presenting with URI. The history is provided by the patient.  URI Presenting symptoms: congestion, cough, fever, rhinorrhea and sore throat   Presenting symptoms: no ear pain and no facial pain   Severity:  Mild Onset quality:  Gradual Duration:  3 weeks Timing:  Constant Progression:  Partially resolved Chronicity:  New Relieved by:  Nothing Worsened by:  Nothing tried Ineffective treatments: hot tea, mucinex dm. Associated symptoms: no arthralgias, no headaches, no sinus pain and no swollen glands     Past Medical History  Diagnosis Date  . Mitral valve prolapse   . Rapid heart beat     States "irreg heart beat"  . Hemorrhoids   . Anemia   . GERD (gastroesophageal reflux disease)   . Positive PPD   . Thyroid disease   . Genital warts   . Hx of cardiac catheterization 2009    normal coronary arteries  . Hx of menorrhagia     Novasure  . Normal cardiac stress test 03/09/2012    no ischemia  . Hypokalemia     Past Surgical History  Procedure Laterality Date  . Novasure ablation    . Cardiac catheterization    . Upper gastrointestinal endoscopy  May 2013    Family History  Problem Relation Age of Onset  . Heart disease Father     died Mi 65  . Hypertension Maternal Grandfather   . Diabetes Maternal Grandfather   . Heart disease Maternal Grandfather   . Colon cancer  Maternal Grandfather   . Diabetes Maternal Grandmother     History  Substance Use Topics  . Smoking status: Never Smoker   . Smokeless tobacco: Never Used  . Alcohol Use: No    OB History   Grav Para Term Preterm Abortions TAB SAB Ect Mult Living                  Review of Systems  Constitutional: Positive for fever. Negative for chills.       No fever for over a week  HENT: Positive for congestion, sore throat, rhinorrhea and postnasal drip. Negative for ear pain and sinus pressure.   Respiratory: Positive for cough.   Musculoskeletal: Negative for arthralgias.  Neurological: Negative for headaches.    Allergies  Ceftriaxone sodium and Doxycycline  Home Medications   Current Outpatient Rx  Name  Route  Sig  Dispense  Refill  . benzonatate (TESSALON) 100 MG capsule   Oral   Take 2 capsules (200 mg total) by mouth every 8 (eight) hours.   21 capsule   0   . calcium-vitamin D (OSCAL WITH D) 500-200 MG-UNIT per tablet   Oral   Take 1 tablet by mouth every morning.          . cholecalciferol (VITAMIN D) 1000 UNITS tablet   Oral   Take 2,000 Units by mouth every morning.          [name]  fluticasone (FLONASE) 50 MCG/ACT nasal spray   Nasal   Place 2 sprays into the nose daily.   16 g   2   . guaiFENesin (MUCINEX) 600 MG 12 hr tablet   Oral   Take 1 tablet (600 mg total) by mouth 2 (two) times daily.   30 tablet   0   . ibuprofen (ADVIL,MOTRIN) 600 MG tablet   Oral   Take 1 tablet (600 mg total) by mouth every 6 (six) hours as needed for pain.   30 tablet   0   . metoprolol tartrate (LOPRESSOR) 25 MG tablet   Oral   Take 25 mg by mouth 2 (two) times daily.          . Multiple Vitamin (MULTIVITAMIN WITH MINERALS) TABS   Oral   Take 1 tablet by mouth daily.         . pantoprazole (PROTONIX) 40 MG tablet   Oral   Take 40 mg by mouth daily as needed (for heartburn).          [name] PARoxetine (PAXIL-CR) 25 MG 24 hr tablet   Oral   Take 25 mg by  mouth every morning.          . potassium chloride SA (K-DUR,KLOR-CON) 20 MEQ tablet   Oral   Take 20 mEq by mouth 2 (two) times daily.            BP 107/73  Pulse 63  Temp(Src) 98 F (36.7 C) (Oral)  Resp 16  SpO2 100%  LMP 11/25/2012  Physical Exam  Constitutional: She appears well-developed and well-nourished. She does not appear ill. No distress.  HENT:  Right Ear: Tympanic membrane, external ear and ear canal normal.  Left Ear: Tympanic membrane, external ear and ear canal normal.  Nose: Mucosal edema and rhinorrhea present. Right sinus exhibits no maxillary sinus tenderness and no frontal sinus tenderness. Left sinus exhibits no maxillary sinus tenderness and no frontal sinus tenderness.  Mouth/Throat: Oropharynx is clear and moist and mucous membranes are normal.  Lymphadenopathy:       Head (right side): No submental, no submandibular and no tonsillar adenopathy present.       Head (left side): No submental, no submandibular and no tonsillar adenopathy present.    She has no cervical adenopathy.    ED Course  Procedures (including critical care time)  Labs Reviewed - No data to display No results found.   1. Allergic rhinitis       MDM  I suspect pt had a URI that has resolved, but also is having allergy symptoms. Rx flonase 2 sprays each nostril qd with 2 refills; pt is to get otc antihistamine and take daily. Pt to f/u with pcp if no improvement with this tx.         Cathlyn Parsons, NP 01/10/13 2019

## 2013-01-10 NOTE — ED Provider Notes (Signed)
Medical screening examination/treatment/ procedure(s) were performed by non-physician practitioner and as supervising physician I was immediately available for consultations/colaborattion.   Treyvin Glidden [place],M.D.  Brittaney Beaulieu M de [place], MD 01/10/13 2110 

## 2013-01-10 NOTE — ED Notes (Signed)
C/o sore throat onset 2 1/2 weeks ago for 2 days.  Then runny nose and greenish yellow mucous from her nose and cough.  No fever. Has had laryngititis and irritated throat for 1 week.

## 2013-01-21 ENCOUNTER — Emergency Department (HOSPITAL_COMMUNITY): Payer: BC Managed Care – PPO

## 2013-01-21 ENCOUNTER — Encounter (HOSPITAL_COMMUNITY): Payer: Self-pay | Admitting: *Deleted

## 2013-01-21 DIAGNOSIS — Z8742 Personal history of other diseases of the female genital tract: Secondary | ICD-10-CM | POA: Insufficient documentation

## 2013-01-21 DIAGNOSIS — Z79899 Other long term (current) drug therapy: Secondary | ICD-10-CM | POA: Insufficient documentation

## 2013-01-21 DIAGNOSIS — K219 Gastro-esophageal reflux disease without esophagitis: Secondary | ICD-10-CM | POA: Insufficient documentation

## 2013-01-21 DIAGNOSIS — Z8679 Personal history of other diseases of the circulatory system: Secondary | ICD-10-CM | POA: Insufficient documentation

## 2013-01-21 DIAGNOSIS — R002 Palpitations: Secondary | ICD-10-CM | POA: Insufficient documentation

## 2013-01-21 DIAGNOSIS — Z8619 Personal history of other infectious and parasitic diseases: Secondary | ICD-10-CM | POA: Insufficient documentation

## 2013-01-21 DIAGNOSIS — Z8639 Personal history of other endocrine, nutritional and metabolic disease: Secondary | ICD-10-CM | POA: Insufficient documentation

## 2013-01-21 DIAGNOSIS — Z862 Personal history of diseases of the blood and blood-forming organs and certain disorders involving the immune mechanism: Secondary | ICD-10-CM | POA: Insufficient documentation

## 2013-01-21 DIAGNOSIS — Z9861 Coronary angioplasty status: Secondary | ICD-10-CM | POA: Insufficient documentation

## 2013-01-21 LAB — CBC
Hemoglobin: 13.2 g/dL (ref 12.0–15.0)
MCH: 24.5 pg — ABNORMAL LOW (ref 26.0–34.0)
MCHC: 32.7 g/dL (ref 30.0–36.0)
MCV: 75 fL — ABNORMAL LOW (ref 78.0–100.0)
Platelets: 239 10*3/uL (ref 150–400)
RBC: 5.39 MIL/uL — ABNORMAL HIGH (ref 3.87–5.11)

## 2013-01-21 LAB — BASIC METABOLIC PANEL
CO2: 27 mEq/L (ref 19–32)
Calcium: 9.7 mg/dL (ref 8.4–10.5)
Creatinine, Ser: 0.98 mg/dL (ref 0.50–1.10)
GFR calc non Af Amer: 65 mL/min — ABNORMAL LOW (ref >90)
Glucose, Bld: 115 mg/dL — ABNORMAL HIGH (ref 70–99)
Sodium: 140 mEq/L (ref 135–145)

## 2013-01-21 LAB — POCT I-STAT TROPONIN I: Troponin i, poc: 0 ng/mL (ref 0.00–0.08)

## 2013-01-21 NOTE — ED Notes (Signed)
Pt states SOB and palpations. Denies pain, N/V.

## 2013-01-22 ENCOUNTER — Emergency Department (HOSPITAL_COMMUNITY)
Admission: EM | Admit: 2013-01-22 | Discharge: 2013-01-22 | Disposition: A | Discharge status: Home / Self Care | Payer: BC Managed Care – PPO | Attending: Emergency Medicine | Admitting: Emergency Medicine

## 2013-01-22 DIAGNOSIS — R002 Palpitations: Secondary | ICD-10-CM
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
## 2013-01-22 NOTE — ED Notes (Signed)
Pt discharged.Vital signs stable and GCS 15 

## 2013-01-30 ENCOUNTER — Ambulatory Visit
Admission: RE | Admit: 2013-01-30 | Discharge: 2013-01-30 | Disposition: A | Discharge status: Home / Self Care | Payer: BC Managed Care – PPO | Source: Ambulatory Visit

## 2013-01-30 DIAGNOSIS — Z1231 Encounter for screening mammogram for malignant neoplasm of breast: Secondary | ICD-10-CM

## 2013-02-01 ENCOUNTER — Ambulatory Visit: Payer: BC Managed Care – PPO

## 2013-02-15 ENCOUNTER — Encounter: Payer: BC Managed Care – PPO | Admitting: Family Medicine

## 2013-02-15 DIAGNOSIS — Z0289 Encounter for other administrative examinations: Secondary | ICD-10-CM

## 2013-02-15 NOTE — Progress Notes (Signed)
error    This encounter was created in error - please disregard.

## 2013-02-16 ENCOUNTER — Ambulatory Visit (INDEPENDENT_AMBULATORY_CARE_PROVIDER_SITE_OTHER): Payer: BC Managed Care – PPO | Admitting: Family

## 2013-02-16 ENCOUNTER — Encounter: Payer: Self-pay | Admitting: Family

## 2013-02-16 ENCOUNTER — Other Ambulatory Visit (INDEPENDENT_AMBULATORY_CARE_PROVIDER_SITE_OTHER): Payer: BC Managed Care – PPO

## 2013-02-16 ENCOUNTER — Encounter (HOSPITAL_COMMUNITY): Payer: Self-pay | Admitting: Emergency Medicine

## 2013-02-16 ENCOUNTER — Emergency Department (INDEPENDENT_AMBULATORY_CARE_PROVIDER_SITE_OTHER)
Admission: EM | Admit: 2013-02-16 | Discharge: 2013-02-16 | Disposition: A | Discharge status: Home / Self Care | Payer: Self-pay | Source: Home / Self Care | Attending: Emergency Medicine | Admitting: Emergency Medicine

## 2013-02-16 VITALS — BP 116/76 | HR 64 | Temp 98.0°F | Wt 203.0 lb

## 2013-02-16 DIAGNOSIS — J309 Allergic rhinitis, unspecified: Secondary | ICD-10-CM

## 2013-02-16 DIAGNOSIS — E876 Hypokalemia: Secondary | ICD-10-CM

## 2013-02-16 DIAGNOSIS — S161XXA Strain of muscle, fascia and tendon at neck level, initial encounter: Secondary | ICD-10-CM

## 2013-02-16 DIAGNOSIS — G44309 Post-traumatic headache, unspecified, not intractable: Secondary | ICD-10-CM

## 2013-02-16 DIAGNOSIS — J029 Acute pharyngitis, unspecified: Secondary | ICD-10-CM

## 2013-02-16 LAB — BASIC METABOLIC PANEL
BUN: 9 mg/dL (ref 6–23)
Chloride: 106 mEq/L (ref 96–112)
Creatinine, Ser: 0.8 mg/dL (ref 0.4–1.2)
Glucose, Bld: 75 mg/dL (ref 70–99)
Potassium: 3.8 mEq/L (ref 3.5–5.1)

## 2013-02-16 MED ORDER — NAPROXEN 500 MG PO TABS: 500.0000 mg | ORAL_TABLET | Freq: Two times a day (BID) | ORAL | Status: DC

## 2013-02-16 MED ORDER — METHYLPREDNISOLONE 4 MG PO KIT: PACK | ORAL | Status: AC

## 2013-02-16 MED ORDER — ACETAMINOPHEN 325 MG PO TABS
ORAL_TABLET | ORAL | Status: AC
  Filled 2013-02-16: qty 2

## 2013-02-16 MED ORDER — METHOCARBAMOL 500 MG PO TABS: 500.0000 mg | ORAL_TABLET | Freq: Three times a day (TID) | ORAL | Status: DC

## 2013-02-16 MED ORDER — ACETAMINOPHEN 325 MG PO TABS
650.0000 mg | ORAL_TABLET | Freq: Once | ORAL | Status: AC
  Administered 2013-02-16: 650 mg via ORAL

## 2013-02-16 MED ORDER — HYDROCODONE-ACETAMINOPHEN 5-325 MG PO TABS: ORAL_TABLET | ORAL | Status: DC
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
## 2013-02-16 NOTE — Patient Instructions (Addendum)
1. Medrol pack x 6 days. Then start Claritin once a day and nasal saline as needed.   Allergic Rhinitis Allergic rhinitis is when the mucous membranes in the nose respond to allergens. Allergens are particles in the air that cause your body to have an allergic reaction. This causes you to release allergic antibodies. Through a chain of events, these eventually cause you to release histamine into the blood stream (hence the use of antihistamines). Although meant to be protective to the body, it is this release that causes your discomfort, such as frequent sneezing, congestion and an itchy runny nose.  CAUSES  The pollen allergens may come from grasses, trees, and weeds. This is seasonal allergic rhinitis, or "hay fever." Other allergens cause year-round allergic rhinitis (perennial allergic rhinitis) such as house dust mite allergen, pet dander and mold spores.  SYMPTOMS   Nasal stuffiness (congestion).  Runny, itchy nose with sneezing and tearing of the eyes.  There is often an itching of the mouth, eyes and ears. It cannot be cured, but it can be controlled with medications. DIAGNOSIS  If you are unable to determine the offending allergen, skin or blood testing may find it. TREATMENT   Avoid the allergen.  Medications and allergy shots (immunotherapy) can help.  Hay fever may often be treated with antihistamines in pill or nasal spray forms. Antihistamines block the effects of histamine. There are over-the-counter medicines that may help with nasal congestion and swelling around the eyes. Check with your caregiver before taking or giving this medicine. If the treatment above does not work, there are many new medications your caregiver can prescribe. Stronger medications may be used if initial measures are ineffective. Desensitizing injections can be used if medications and avoidance fails. Desensitization is when a patient is given ongoing shots until the body becomes less sensitive to the  allergen. Make sure you follow up with your caregiver if problems continue. SEEK MEDICAL CARE IF:   You develop fever (more than 100.5 F (38.1 C).  You develop a cough that does not stop easily (persistent).  You have shortness of breath.  You start wheezing.  Symptoms interfere with normal daily activities. Document Released: 05/25/2001 Document Revised: 11/22/2011 Document Reviewed: 12/04/2008 [facility] Patient Information 2014 [place], [place].
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
## 2013-02-16 NOTE — Progress Notes (Signed)
Subjective:    Patient ID: [name], female    DOB: 02-09-1961, 52 y.o.   MRN: [id number]  HPI  52 year old [place] American female, nonsmoker, patient of Dr. Fabian Sharp is in today with complaints of sore throat, congestion and cough x2 months. She's been seen at urgent care clinic who prescribed Flonase and recommended an antihistamine. Patient reports taking the Flonase 2 days and not tolerating Allegra therefore she did not take it. She is a known allergy to pollen. Denies any other environmental changes.  Has a history of hypokalemia.   Review of Systems  Constitutional: Negative.   HENT: Positive for congestion, sore throat, rhinorrhea, sneezing and postnasal drip.   Eyes: Negative.   Respiratory: Positive for cough.   Cardiovascular: Negative.   Skin: Negative.   Allergic/Immunologic: Positive for environmental allergies. Negative for food allergies and immunocompromised state.  Neurological: Negative.   Psychiatric/Behavioral: Negative.    Past Medical History  Diagnosis Date  . Mitral valve prolapse   . Rapid heart beat     States "irreg heart beat"  . Hemorrhoids   . Anemia   . GERD (gastroesophageal reflux disease)   . Positive PPD   . Genital warts   . Hx of cardiac catheterization 2009    normal coronary arteries  . Hx of menorrhagia     Novasure  . Normal cardiac stress test 03/09/2012    no ischemia  . Hypokalemia   . Thyroid disease     Thyroid nodules    History   Social History  . Marital Status: Divorced    Spouse Name: N/A    Number of Children: 2  . Years of Education: 16   Occupational History  . dialysis tech    Social History Main Topics  . Smoking status: Never Smoker   . Smokeless tobacco: Never Used  . Alcohol Use: No  . Drug Use: No  . Sexually Active: Not on file   Other Topics Concern  . 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 She lived in [place] and in [place] for a number of years her mom is from [place]. 16 years of education went to college at [employer] lives at home with her son who is in his 53s no pets   Neg ets tob etoh hx PA   6 hours of sleep   G2P2    TD2010  colonoscopy 2009             Past Surgical History  Procedure Laterality Date  . Novasure ablation    . Cardiac catheterization    . Upper gastrointestinal endoscopy  May 2013    Family History  Problem Relation Age of Onset  . Heart disease Father     died Mi 65  . Hypertension Maternal Grandfather   . Diabetes Maternal Grandfather   . Heart disease Maternal Grandfather   . Colon cancer Maternal Grandfather   . Diabetes Maternal Grandmother     Allergies  Allergen Reactions  . Ceftriaxone Sodium Itching  . Doxycycline Rash    Current Outpatient Prescriptions on File Prior to Visit  Medication Sig Dispense Refill  . cholecalciferol (VITAMIN D) 1000 UNITS tablet Take 2,000 Units by mouth every morning.       . metoprolol tartrate (LOPRESSOR) 25 MG tablet Take 25 mg by  mouth 2 (two) times daily.       . Multiple Vitamin (MULTIVITAMIN WITH MINERALS) TABS Take 1 tablet by mouth daily.      . pantoprazole (PROTONIX) 40 MG tablet Take 40 mg by mouth daily as needed (for heartburn).       [name] PARoxetine (PAXIL-CR) 25 MG 24 hr tablet Take 25 mg by mouth every morning.       . potassium chloride SA (K-DUR,KLOR-CON) 20 MEQ tablet Take 20 mEq by mouth 2 (two) times daily.       . calcium-vitamin D (OSCAL WITH D) 500-200 MG-UNIT per tablet Take 1 tablet by mouth every morning.        No current facility-administered medications on file prior to visit.    BP 116/76  Pulse 64  Temp(Src) 98 F (36.7 C) (Oral)  Wt 203 lb (92.08 kg)  BMI 32.78 kg/m2  SpO2 98%  LMP 05/04/2014chart    Objective:   Physical Exam  Constitutional: She is oriented to person, place, and  time. She appears well-developed and well-nourished.  HENT:  Right Ear: External ear normal.  Left Ear: External ear normal.  Nose: Nose normal.  Mouth/Throat: Oropharynx is clear and moist.  Neck: Normal range of motion. Neck supple.  Cardiovascular: Normal rate, regular rhythm and normal heart sounds.   Pulmonary/Chest: Effort normal and breath sounds normal.  Abdominal: Soft. Bowel sounds are normal.  Neurological: She is alert and oriented to person, place, and time.  Skin: Skin is warm and dry.  Psychiatric: She has a normal mood and affect.          Assessment & Plan:  Assessment:  1. Allergic rhinitis 2. Pharyngitis 3. Postnasal drip 4. Hypokalemia  Plan: Medrol Dosepak as directed to help get the symptoms under control. After day 6, start Claritin 10mg  once daily. Nasal saline as needed for dryness. Patient call the office if symptoms worsen or persist. Recheck a schedule, and as needed. BMP sent.

## 2013-02-16 NOTE — Addendum Note (Signed)
Addended by: Beverely Low on: 02/16/2013 01:10 PM   Modules accepted: Orders

## 2013-02-28 ENCOUNTER — Telehealth: Payer: Self-pay | Admitting: Internal Medicine

## 2013-02-28 MED ORDER — POTASSIUM CHLORIDE CRYS ER 20 MEQ PO TBCR: 20.0000 meq | EXTENDED_RELEASE_TABLET | Freq: Two times a day (BID) | ORAL | Status: DC

## 2013-02-28 NOTE — Telephone Encounter (Signed)
PT calling to request a 3 month supply with refills of her potassium chloride SA (K-DUR,KLOR-CON) CR tablet 40 mEq. She would like it sent to [facility] on [address]. Please assist.

## 2013-02-28 NOTE — Telephone Encounter (Signed)
Sent to the pharmacy by e-scribe. 

## 2013-03-29 ENCOUNTER — Ambulatory Visit (INDEPENDENT_AMBULATORY_CARE_PROVIDER_SITE_OTHER): Payer: BC Managed Care – PPO | Admitting: Internal Medicine

## 2013-03-29 ENCOUNTER — Encounter: Payer: Self-pay | Admitting: Internal Medicine

## 2013-03-29 VITALS — BP 110/72 | HR 66 | Temp 98.1°F | Wt 204.0 lb

## 2013-03-29 DIAGNOSIS — R209 Unspecified disturbances of skin sensation: Secondary | ICD-10-CM

## 2013-03-29 DIAGNOSIS — M79675 Pain in left toe(s): Secondary | ICD-10-CM

## 2013-03-29 DIAGNOSIS — M2140 Flat foot [pes planus] (acquired), unspecified foot: Secondary | ICD-10-CM

## 2013-03-29 DIAGNOSIS — R200 Anesthesia of skin: Secondary | ICD-10-CM | POA: Insufficient documentation

## 2013-03-29 DIAGNOSIS — M79676 Pain in unspecified toe(s): Secondary | ICD-10-CM | POA: Insufficient documentation

## 2013-03-29 DIAGNOSIS — M2141 Flat foot [pes planus] (acquired), right foot: Secondary | ICD-10-CM | POA: Insufficient documentation

## 2013-03-29 DIAGNOSIS — M79609 Pain in unspecified limb: Secondary | ICD-10-CM

## 2013-03-29 NOTE — Patient Instructions (Signed)
This acts like a mechanical foot problem with seondary  Nerve irritaion. Poss aggravated by your flat feet.   Soak the foot toe at least once a day in warm soapy water   To help the toenail are get better  . Good fitting shoes in the meantime and no heels. Will be contacted  About sports medicine  Dr Darrick Penna clic to evaluate feet and foot pain and numbness .

## 2013-03-29 NOTE — Progress Notes (Signed)
Chief Complaint  Patient presents with  . Foot Pain    Rt foot.  Pain started last week.    HPI: Patient comes in today for SDA for  new problem evaluation.   Onset a week ago of right foot great toe  pain  Left great toe near nail after pedicure   Mild redness  No rx   But more concern about  Foot pain and toe numbness on the right .  Numb for a while   Has mortons neuroma in 3 and 4 th but now  12 hours  4-5 days per week  On feet  No heels   Dialysis    feet hurt by end of day .    No specific injury .   Tried an orthotic otc a while back and feet felt better but knee began to hurt .    ROS: See pertinent positives and negatives per HPI.  Past Medical History  Diagnosis Date  . Mitral valve prolapse   . Rapid heart beat     States "irreg heart beat"  . Hemorrhoids   . Anemia   . GERD (gastroesophageal reflux disease)   . Positive PPD   . Genital warts   . Hx of cardiac catheterization 2009    normal coronary arteries  . Hx of menorrhagia     Novasure  . Normal cardiac stress test 03/09/2012    no ischemia  . Hypokalemia   . Thyroid disease     Thyroid nodules    Family History  Problem Relation Age of Onset  . Heart disease Father     died Mi 87  . Hypertension Maternal Grandfather   . Diabetes Maternal Grandfather   . Heart disease Maternal Grandfather   . Colon cancer Maternal Grandfather   . Diabetes Maternal Grandmother     History   Social History  . Marital Status: Divorced    Spouse Name: N/A    Number of Children: 2  . Years of Education: 16   Occupational History  . dialysis tech    Social History Main Topics  . Smoking status: Never Smoker   . Smokeless tobacco: Never Used  . Alcohol Use: No  . Drug Use: No  . Sexually Active: None   Other Topics Concern  . None   Social History Narrative   Ms. 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 She lived in [place] and in [place] for a number of years her mom is from [place]. 16 years of education went to college at [employer] lives at home with her son who is in his 72s no pets   Neg ets tob etoh hx PA   6 hours of sleep   G2P2    TD2010  colonoscopy 2009             Outpatient Encounter Prescriptions as of 03/29/2013  Medication Sig Dispense Refill  . calcium-vitamin D (OSCAL WITH D) 500-200 MG-UNIT per tablet Take 1 tablet by mouth every morning.       . cholecalciferol (VITAMIN D) 1000 UNITS tablet Take 2,000 Units by mouth every morning.       . metoprolol tartrate (LOPRESSOR) 25 MG tablet Take 25 mg by mouth 2 (two) times daily.       . Multiple Vitamin (MULTIVITAMIN WITH MINERALS) TABS Take 1 tablet by mouth daily.      [name]  pantoprazole (PROTONIX) 40 MG tablet Take 40 mg by mouth daily as needed (for heartburn).       [name] PARoxetine (PAXIL-CR) 25 MG 24 hr tablet Take 25 mg by mouth every morning.       . potassium chloride SA (K-DUR,KLOR-CON) 20 MEQ tablet Take 1 tablet (20 mEq total) by mouth 2 (two) times daily.  90 tablet  1  . [DISCONTINUED] HYDROcodone-acetaminophen (NORCO/VICODIN) 5-325 MG per tablet 1 to 2 tabs every 4 to 6 hours as needed for pain.  20 tablet  0  . [DISCONTINUED] methocarbamol (ROBAXIN) 500 MG tablet Take 1 tablet (500 mg total) by mouth 3 (three) times daily.  30 tablet  0  . [DISCONTINUED] naproxen (NAPROSYN) 500 MG tablet Take 1 tablet (500 mg total) by mouth 2 (two) times daily.  30 tablet  0   No facility-administered encounter medications on file as of 03/29/2013.    EXAM:  BP 110/72  Pulse 66  Temp(Src) 98.1 F (36.7 C) (Oral)  Wt 204 lb (92.534 kg)  BMI 32.94 kg/m2  SpO2 99%  LMP 03/27/2013  Body mass index is 32.94 kg/(m^2).  GENERAL: vitals reviewed and listed above, alert, oriented, appears well hydrated and in no acute distress  MS: moves all extremities without noticeable focal  Abnormality right  foot  Pedicure  Minima;l redness at medial nail no dc no tendernss  Bunion deformity and  Dec sense medial great toe   To MTP   rom nl NV otherwise ok  .  PSYCH: pleasant and cooperative, no obvious depression or anxiety  ASSESSMENT AND PLAN:  Discussed the following assessment and plan:  Toe pain, left - Plan: Ambulatory referral to Sports Medicine  Numbness of toes - Plan: Ambulatory referral to Sports Medicine  Pes planus (flat feet), unspecified laterality - Plan: Ambulatory referral to Sports Medicine  -Patient advised to return or notify health care team  if symptoms worsen or persist or new concerns arise.  Patient Instructions  This acts like a mechanical foot problem with seondary  Nerve irritaion. Poss aggravated by your flat feet.   Soak the foot toe at least once a day in warm soapy water   To help the toenail are get better  . Good fitting shoes in the meantime and no heels. Will be contacted  About sports medicine  Dr Darrick Penna clic to evaluate feet and foot pain and numbness .     Neta Mends. Panosh M.D.

## 2013-04-18 ENCOUNTER — Telehealth: Payer: Self-pay | Admitting: Internal Medicine
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
## 2013-04-18 NOTE — Telephone Encounter (Signed)
She should  Have OV   And thus see cardiologist.   She needs more evaluation than a lab test.

## 2013-04-18 NOTE — Telephone Encounter (Signed)
Patient notified.  Stated she seen cardiology this morning and they performed testing.  She will continue to follow up with them.

## 2013-05-05 ENCOUNTER — Encounter (HOSPITAL_COMMUNITY): Payer: Self-pay | Admitting: Emergency Medicine

## 2013-05-05 ENCOUNTER — Emergency Department (HOSPITAL_COMMUNITY)
Admission: EM | Admit: 2013-05-05 | Discharge: 2013-05-05 | Disposition: A | Discharge status: Home / Self Care | Payer: BC Managed Care – PPO | Source: Home / Self Care

## 2013-05-05 DIAGNOSIS — M79609 Pain in unspecified limb: Secondary | ICD-10-CM

## 2013-05-05 DIAGNOSIS — M5416 Radiculopathy, lumbar region: Secondary | ICD-10-CM

## 2013-05-05 DIAGNOSIS — M79674 Pain in right toe(s): Secondary | ICD-10-CM

## 2013-05-05 DIAGNOSIS — IMO0002 Reserved for concepts with insufficient information to code with codable children: Secondary | ICD-10-CM

## 2013-05-05 MED ORDER — PREDNISONE (PAK) 10 MG PO TABS: 10.0000 mg | ORAL_TABLET | Freq: Every day | ORAL | Status: DC

## 2013-05-05 NOTE — ED Notes (Signed)
Patient reports numbness in right lateral leg , intermittent and gradually moving up right leg to hip.  Patient has a new right great toe complaint and has appt scheduled for sports medicine on Monday for toe.  Patient has chronic back pain.  Numbness noticed 1 1/2-2 weeks ago.

## 2013-05-05 NOTE — ED Provider Notes (Signed)
CSN: [id number]     Arrival date & time 05/05/13  1006 History     None    Chief Complaint  Patient presents with  . Numbness   (Consider location/radiation/quality/duration/timing/severity/associated sxs/prior Treatment) HPI Comments: [name] presents with right lower leg sporadic numbness and right great toe "discomfort" for a week. No injury. History of DDD in the lumbar spine. No weakness. No foot drop. No loss of bowel or bladder control. Constant lower back pain through the years.   The history is provided by the patient.    Past Medical History  Diagnosis Date  . Mitral valve prolapse   . Rapid heart beat     States "irreg heart beat"  . Hemorrhoids   . Anemia   . GERD (gastroesophageal reflux disease)   . Positive PPD   . Genital warts   . Hx of cardiac catheterization 2009    normal coronary arteries  . Hx of menorrhagia     Novasure  . Normal cardiac stress test 03/09/2012    no ischemia  . Hypokalemia   . Thyroid disease     Thyroid nodules   Past Surgical History  Procedure Laterality Date  . Novasure ablation    . Cardiac catheterization    . Upper gastrointestinal endoscopy  May 2013   Family History  Problem Relation Age of Onset  . Heart disease Father     died Mi 26  . Hypertension Maternal Grandfather   . Diabetes Maternal Grandfather   . Heart disease Maternal Grandfather   . Colon cancer Maternal Grandfather   . Diabetes Maternal Grandmother    History  Substance Use Topics  . Smoking status: Never Smoker   . Smokeless tobacco: Never Used  . Alcohol Use: No   OB History   Grav Para Term Preterm Abortions TAB SAB Ect Mult Living                 Review of Systems  Neurological: Positive for numbness. Negative for dizziness, tremors and weakness.  Psychiatric/Behavioral: Negative.   All other systems reviewed and are negative.    Allergies  Ceftriaxone sodium and Doxycycline  Home Medications   Current Outpatient Rx  Name   Route  Sig  Dispense  Refill  . calcium-vitamin D (OSCAL WITH D) 500-200 MG-UNIT per tablet   Oral   Take 1 tablet by mouth every morning.          . cholecalciferol (VITAMIN D) 1000 UNITS tablet   Oral   Take 2,000 Units by mouth every morning.          . metoprolol tartrate (LOPRESSOR) 25 MG tablet   Oral   Take 25 mg by mouth 2 (two) times daily.          . Multiple Vitamin (MULTIVITAMIN WITH MINERALS) TABS   Oral   Take 1 tablet by mouth daily.         . pantoprazole (PROTONIX) 40 MG tablet   Oral   Take 40 mg by mouth daily as needed (for heartburn).          [name] PARoxetine (PAXIL-CR) 25 MG 24 hr tablet   Oral   Take 25 mg by mouth every morning.          . potassium chloride SA (K-DUR,KLOR-CON) 20 MEQ tablet   Oral   Take 1 tablet (20 mEq total) by mouth 2 (two) times daily.   90 tablet   1    BP  110/74  Pulse 68  Temp(Src) 98.5 F (36.9 C) (Oral)  Resp 14  SpO2 98% Physical Exam  Nursing note and vitals reviewed. Constitutional: She is oriented to person, place, and time. She appears well-developed and well-nourished.  HENT:  Head: Normocephalic and atraumatic.  Neck: Neck supple.  Musculoskeletal: Normal range of motion. She exhibits no edema and no tenderness.  Neurological: She is alert and oriented to person, place, and time. She has normal reflexes. She displays normal reflexes. No cranial nerve deficit. She exhibits normal muscle tone. Coordination normal.  Slight decrease to light touch along right lateral calf, no swelling  Skin: Skin is warm and dry.  Psychiatric: She has a normal mood and affect. Her behavior is normal.    ED Course   Procedures (including critical care time)  Labs Reviewed - No data to display No results found. 1. Right lumbar radiculopathy   2. Toe pain, right     MDM  Mild lumbar radiculopathy neuro deficit/U with ortho is worsens. Pred Pack In the interim  Azucena Fallen, PA-C 05/05/13 1119

## 2013-05-05 NOTE — ED Provider Notes (Signed)
Medical screening examination/treatment/procedure(s) were performed by a resident physician or non-physician practitioner and as the supervising physician I was immediately available for consultation/collaboration.  Brice Potteiger, MD   Keirra Zeimet S Jonpaul Lumm, MD 05/05/13 1953 

## 2013-05-05 NOTE — ED Notes (Signed)
Patient talking with another patient

## 2013-05-07 ENCOUNTER — Ambulatory Visit: Payer: BC Managed Care – PPO | Admitting: Family Medicine

## 2013-05-08 ENCOUNTER — Ambulatory Visit: Payer: BC Managed Care – PPO | Admitting: Family Medicine

## 2013-07-01 ENCOUNTER — Emergency Department (INDEPENDENT_AMBULATORY_CARE_PROVIDER_SITE_OTHER)
Admission: EM | Admit: 2013-07-01 | Discharge: 2013-07-01 | Disposition: A | Discharge status: Home / Self Care | Payer: BC Managed Care – PPO | Source: Home / Self Care | Attending: Emergency Medicine | Admitting: Emergency Medicine

## 2013-07-01 ENCOUNTER — Encounter (HOSPITAL_COMMUNITY): Payer: Self-pay | Admitting: Emergency Medicine

## 2013-07-01 DIAGNOSIS — R5383 Other fatigue: Secondary | ICD-10-CM

## 2013-07-01 DIAGNOSIS — R002 Palpitations: Secondary | ICD-10-CM

## 2013-07-01 DIAGNOSIS — R5381 Other malaise: Secondary | ICD-10-CM

## 2013-07-01 HISTORY — DX: Hypomagnesemia: E83.42

## 2013-07-01 HISTORY — DX: Other disorders of phosphorus metabolism: E83.39

## 2013-07-01 LAB — TSH: TSH: 1.048 u[IU]/mL (ref 0.350–4.500)

## 2013-07-01 LAB — POCT I-STAT, CHEM 8
BUN: 10 mg/dL (ref 6–23)
Calcium, Ion: 1.3 mmol/L — ABNORMAL HIGH (ref 1.12–1.23)
Chloride: 104 mEq/L (ref 96–112)
Creatinine, Ser: 1 mg/dL (ref 0.50–1.10)
Glucose, Bld: 98 mg/dL (ref 70–99)
TCO2: 24 mmol/L (ref 0–100)

## 2013-07-01 MED ORDER — IBUPROFEN 800 MG PO TABS
ORAL_TABLET | ORAL | Status: AC
  Filled 2013-07-01: qty 1

## 2013-07-01 MED ORDER — IBUPROFEN 800 MG PO TABS
800.0000 mg | ORAL_TABLET | Freq: Once | ORAL | Status: AC
  Administered 2013-07-01: 800 mg via ORAL

## 2013-07-01 NOTE — ED Provider Notes (Signed)
Chief Complaint:   Chief Complaint  Patient presents with  . Fatigue  . Tachycardia    History of Present Illness:   [name] is a 49 old female who has felt completely exhausted and fatigued for the past 3 days. She's had daytime and nighttime sweats for the past 8 months. She saw her gynecologist and had testing for hormone levels, but she was told that she was not postmenopausal. She has a goiter, but has not been tested for thyroid function recently. This morning she noticed rapid heartbeat shortly after getting up. She took 2 of her metoprolol at around 7 AM and 8 AM. This seemed to slow her heart down. She denies any chest pain, tightness, pressure, dizziness, or presyncope. She's had lower back pain for months. Occasionally she feels nauseated. She's had a history of low potassium in the past and she takes a potassium supplement. She also has a history of PSVT. She had to go to the hospital on September 13 and received adenosine.  Review of Systems:  Other than noted above, the patient denies any of the following symptoms. Systemic:  No fever, chills, or fatigue. Pulmonary:  No cough, wheezing, shortness of breath. Cardiac:  No chest pain, tightness, pressure, dizziness, presyncope, syncope, PND, orthopnea, or edema. Ext:  No leg pain or swelling. Neuro:  No weakness, paresthesias, or difficulty with speech or gait. Psych:  No anxiety or depression. Endo:  No weight loss, tremor, sweats, or heat intolerance.   PMFSH:  Past medical history, family history, social history, meds, and allergies were reviewed and updated as needed. No history of cardiac disease.  No history of excessive alcohol intake.   Physical Exam:   Vital signs:  BP 152/78  Pulse 76  Temp(Src) 98.4 F (36.9 C) (Oral)  Resp 16  SpO2 99%  LMP 06/14/2013 Gen:  Alert, oriented, in no distress, skin warm and dry. Eye:  PERRL, lids and conjunctivas normal.  No stare or lid lag. ENT:  Mucous membranes moist,  pharynx clear. Neck:  Supple, no adenopathy or tenderness.  No JVD.  Thyroid not enlarged. Lungs:  Clear to auscultation, no wheezes, rales or rhonchi.  No respiratory distress. Heart:  Regular rhythm, no extrasystoles.  No gallops, murmers, clicks or rubs. Abdomen:  Soft, nontender, no organomegaly or mass.  Bowel sounds normal.  No pulsatile abdominal mass or bruit. Ext:  No edema. Pulses full and equal. Skin:  Warm and dry.  No rash.  Labs:   Results for orders placed during the hospital encounter of 07/01/13  TSH      Result Value Range   TSH 1.048  0.350 - 4.500 uIU/mL  POCT I-STAT, CHEM 8      Result Value Range   Sodium 141  135 - 145 mEq/L   Potassium 3.5  3.5 - 5.1 mEq/L   Chloride 104  96 - 112 mEq/L   BUN 10  6 - 23 mg/dL   Creatinine, Ser 1.61  0.50 - 1.10 mg/dL   Glucose, Bld 98  70 - 99 mg/dL   Calcium, Ion 0.96 (*) 1.12 - 1.23 mmol/L   TCO2 24  0 - 100 mmol/L   Hemoglobin 15.3 (*) 12.0 - 15.0 g/dL   HCT 04.5  40.9 - 81.1 %     EKG:   Date: 07/01/2013  Rate: 72  Rhythm: normal sinus rhythm  QRS Axis: normal  Intervals: normal  ST/T Wave abnormalities: normal  Conduction Disutrbances:none  Narrative Interpretation: Normal sinus  rhythm with sinus arrhythmia, normal EKG.  Old EKG Reviewed: none available  Assessment:  The primary encounter diagnosis was Palpitations. A diagnosis of Fatigue was also pertinent to this visit.  I'm not sure as to the cause of her fatigue and exhaustion. Her potassium is borderline low I suggested that she increase her potassium dose to 3 pills per day. Suggested she followup with her cardiologist or primary care physician. We'll call her back about her TSH result which is normal. I'm not sure the cause of her sweats, but I suspect that she is perimenopausal despite what her tests show.  Plan:   1.  Meds:  The following meds were prescribed:   Discharge Medication List as of 07/01/2013 11:09 AM      2.  Patient  Education/Counseling:  The patient was given appropriate handouts, self care instructions, and instructed in symptomatic relief.  Suggested she increase her potassium to 3 per day and followup with her cardiologist and primary care physicians.  3.  Follow up:  The patient was told to follow up if no better in 3 to 4 days, if becoming worse in any way, and given some red flag symptoms such as syncope, presyncope, chest pain or dyspnea which would prompt immediate return.  Follow up with primary care physicians as needed.Reuben Likes, MD 07/01/13 [phone]

## 2013-07-01 NOTE — ED Notes (Signed)
C/O extreme fatigue - had to miss work on Fri due to fatigue; woke up this morning with feeling of rapid HR (unk rate) - took one metoprolol @ 0700 and another @ 0845.  States tachycardia feels resolved now.  Also c/o sweating profusely over past 8 months; mid-low back pain x several weeks/months.  Denies any chest pain, SOB, dizziness.  C/O slight nausea.  States had low K+, Mg, Phos levels approx 13 months ago and "felt same way".

## 2013-07-01 NOTE — ED Notes (Signed)
Patient is resting comfortably, asleep. 

## 2013-07-02 ENCOUNTER — Encounter: Payer: Self-pay | Admitting: Internal Medicine

## 2013-07-02 ENCOUNTER — Ambulatory Visit (INDEPENDENT_AMBULATORY_CARE_PROVIDER_SITE_OTHER): Payer: BC Managed Care – PPO | Admitting: Internal Medicine

## 2013-07-02 VITALS — BP 98/66 | HR 70 | Temp 97.7°F | Wt 200.0 lb

## 2013-07-02 DIAGNOSIS — R61 Generalized hyperhidrosis: Secondary | ICD-10-CM

## 2013-07-02 DIAGNOSIS — R002 Palpitations: Secondary | ICD-10-CM

## 2013-07-02 DIAGNOSIS — E876 Hypokalemia: Secondary | ICD-10-CM

## 2013-07-02 DIAGNOSIS — R5381 Other malaise: Secondary | ICD-10-CM

## 2013-07-02 DIAGNOSIS — IMO0001 Reserved for inherently not codable concepts without codable children: Secondary | ICD-10-CM

## 2013-07-02 DIAGNOSIS — R031 Nonspecific low blood-pressure reading: Secondary | ICD-10-CM

## 2013-07-02 DIAGNOSIS — E042 Nontoxic multinodular goiter: Secondary | ICD-10-CM

## 2013-07-02 NOTE — Patient Instructions (Addendum)
Will arrange  Another endocrine check about the thyroid,   Intermittent  Calcium irregularity  and sweating and     Low potassium issues.  Ineffective sleep can cause issues   Often  related to menopause.    You blood pressure tends to  Be low . Uncertain if  Related .   check  Bp at timtes to make sure is in range .  Will see if we can get help with cardiology also .  Consider changing to  Extended release     Metoprolol.

## 2013-07-02 NOTE — Progress Notes (Signed)
Chief Complaint  Patient presents with  . Follow-up    ED. Palpitations and fatigue.    HPI: Patient comes in today for SDA for problem evaluation. Was seen in urgent care for episode of fatigue ? Neg evalu btu ionized calcium was borderline elevated and K 3.5    Has been sweating off and on for the past year.  Tired   Recenetly.  12 hours  5 days  Could be that.   Periods  Ablation so cante tell. If menopausal but bleeding is  Regular.     Dr Lollie Marrow. Is gyne  Fibroids  Had check recently   Hair breaking off  And fatigue. Asks about thryoid  Saw dr E lat year and ok at that time  Cards  Monitored .  For  palpaitations  On b blockers. IR 50 bid   No sleep apnea. Hx of same.   Dr Hyman Hopes   Renal said could do testing for reason for low k hereditaty but may not help her   paxil rx For years.   Helps from family stress. Doesn't think causing sx.   bx of thyroid in past  And had it checked .   ROS: See pertinent positives and negatives per HPI. no syncope palpations early  In am at times  No bleeding depression  swelling  Past Medical History  Diagnosis Date  . Mitral valve prolapse   . Rapid heart beat     States "irreg heart beat"  . Hemorrhoids   . Anemia   . GERD (gastroesophageal reflux disease)   . Positive PPD   . Genital warts   . Hx of cardiac catheterization 2009    normal coronary arteries  . Hx of menorrhagia     Novasure  . Normal cardiac stress test 03/09/2012    no ischemia  . Hypokalemia   . Thyroid disease     Thyroid nodules  . Hypomagnesemia   . Hypophosphatemia     Family History  Problem Relation Age of Onset  . Heart disease Father     died Mi 66  . Hypertension Maternal Grandfather   . Diabetes Maternal Grandfather   . Heart disease Maternal Grandfather   . Colon cancer Maternal Grandfather   . Diabetes Maternal Grandmother     History   Social History  . Marital Status: Divorced    Spouse Name: N/A    Number of  Children: 2  . Years of Education: 16   Occupational History  . dialysis tech    Social History Main Topics  . Smoking status: Never Smoker   . Smokeless tobacco: Never Used  . Alcohol Use: No  . Drug Use: No  . Sexual Activity: None   Other Topics Concern  . None   Social History Narrative   Ms. 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 She lived in [place] and in [place] for a number of years her mom is from [place]. 16 years of education went to college at [employer] lives at home with her son who is in his 51s no pets   Neg ets tob etoh hx PA   6 hours of sleep   G2P2    TD2010  colonoscopy 2009             Outpatient Encounter Prescriptions as of 07/02/2013  Medication Sig Dispense Refill  .  cholecalciferol (VITAMIN D) 1000 UNITS tablet Take 2,000 Units by mouth every morning.       . metoprolol tartrate (LOPRESSOR) 25 MG tablet Take 25 mg by mouth 2 (two) times daily.       . Multiple Vitamin (MULTIVITAMIN WITH MINERALS) TABS Take 1 tablet by mouth daily.      [name] PARoxetine (PAXIL-CR) 25 MG 24 hr tablet Take 25 mg by mouth every morning.       . potassium chloride SA (K-DUR,KLOR-CON) 20 MEQ tablet Take 40 mEq by mouth 2 (two) times daily.      . [DISCONTINUED] calcium-vitamin D (OSCAL WITH D) 500-200 MG-UNIT per tablet Take 1 tablet by mouth every morning.       . [DISCONTINUED] pantoprazole (PROTONIX) 40 MG tablet Take 40 mg by mouth daily as needed (for heartburn).       . [DISCONTINUED] predniSONE (STERAPRED UNI-PAK) 10 MG tablet Take 1 tablet (10 mg total) by mouth daily. As directed  48 tablet  0   No facility-administered encounter medications on file as of 07/02/2013.    EXAM:  BP 98/66  Pulse 70  Temp(Src) 97.7 F (36.5 C) (Oral)  Wt 200 lb (90.719 kg)  BMI 32.3 kg/m2  SpO2 97%  LMP 06/14/2013  Body mass index is 32.3 kg/(m^2). Repeat  bp 118/70 reg cuff left  GENERAL: vitals reviewed and listed above, alert, oriented, appears well hydrated and in no acute distress  HEENT: atraumatic, conjunctiva  clear, no obvious abnormalities on inspection of external nose and ears OP : no lesion edema or exudate  No jvd  NECK: no obvious masses on inspection palpation  LUNGS: clear to auscultation bilaterally, no wheezes, rales or rhonchi, good air movement CV: HRRR, no clubbing cyanosis or  peripheral edema nl cap refill  MS: moves all extremities without noticeable focal  abnormality PSYCH: pleasant and cooperative, no obvious depression or anxiety Neuro non focal no tremor  Lab Results  Component Value Date   WBC 8.9 01/21/2013   HGB 15.3* 07/01/2013   HCT 45.0 07/01/2013   PLT 239 01/21/2013   GLUCOSE 98 07/01/2013   CHOL 115 03/10/2012   TRIG 61 03/10/2012   HDL 50 03/10/2012   LDLCALC 53 03/10/2012   ALT 10 10/26/2012   AST 14 10/26/2012   NA 141 07/01/2013   K 3.5 07/01/2013   CL 104 07/01/2013   CREATININE 1.00 07/01/2013   BUN 10 07/01/2013   CO2 28 02/16/2013   TSH 1.048 07/01/2013   INR 0.95 03/10/2012   HGBA1C 6.0 09/29/2012    ASSESSMENT AND PLAN:  Discussed the following assessment and plan:  Hypokalemia - Plan: Ambulatory referral to Endocrinology  Other malaise and fatigue - ? in spells  surely could be stress butgets am palpitaions on awakening and low bp readings  - Plan: Ambulatory referral to Endocrinology  Hypocalcemia - Plan: Ambulatory referral to Endocrinology  Palpitations - triggers ed visit unceratin if K is low need more cards opinoin - Plan: Ambulatory referral to Endocrinology  Low blood pressure reading - ? if b blocker is causing sx of fatigue speels  consider ER med lower dose event recorder and or ortho static moitoring? - Plan: Ambulatory referral to Endocrinology  Multinodular goiter - Plan: Ambulatory referral to Endocrinology  Sweating  Patient has had 6 emergency room visits in 6  months. For various things 50% for palpitations  but she is most concerned when she gets the palpitations or racing heart. Isn't sure if  her potassium is low or she needs to seek care. She hasn't had an event monitor in a number of years. She is here today for acute fatigyue but basically has a good exam .  We have no explanation for her hypokalemia at this time she was told she had PSVT and is on immediate release metoprolol twice a day her blood pressure was low at injury and has been low in the past question whether bioavailability peak and valley of this medication is causing a problem and may do better on extended release. Have suggested she check her blood pressure at these times. If she is getting palpitations and racing first thing in the morning she states she feels her blood pressure is changing. She describes being hot all the time but not really hot flushes. Will review the chart to see if she's had 24-hour urine for catecholamines etc.  Her current fatigue could certainly be multifactorial. It is reasonable to get endocrinology involved again to look at her palpitations intermittent elevated calcium low potassium mng  Anxiety and stress could always be a cofactor uncertain why now. Wonders if she should do no more than 8 hour shifts a day. 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  I review the chart and communicate with cardiology advisability of a closer look of her palpitations first thing in the morning and possibly changing metoprolol to extended release. If she is hypotensive at times she may do better with a dosage adjustment. -Patient advised to return or notify health care team  if symptoms worsen or persist or new concerns arise.  Patient Instructions  Will arrange  Another endocrine check about the thyroid,   Intermittent  Calcium irregularity  and sweating and     Low potassium issues.  Ineffective sleep can cause  issues   Often  related to menopause.    You blood pressure tends to  Be low . Uncertain if  Related .   check  Bp at timtes to make sure is in range .  Will see if we can get help with cardiology also .  Consider changing to  Extended release     Metoprolol.   Neta Mends. Panosh M.D.  Total visit 40 mins > 50% spent counseling and coordinating care

## 2013-07-02 NOTE — ED Notes (Signed)
TSH 1.048 WNL. [name] 07/02/2013

## 2013-07-05 DIAGNOSIS — IMO0001 Reserved for inherently not codable concepts without codable children: Secondary | ICD-10-CM | POA: Insufficient documentation

## 2013-07-05 DIAGNOSIS — R002 Palpitations: Secondary | ICD-10-CM | POA: Insufficient documentation

## 2013-07-05 DIAGNOSIS — R5381 Other malaise: Secondary | ICD-10-CM | POA: Insufficient documentation

## 2013-07-05 DIAGNOSIS — R031 Nonspecific low blood-pressure reading: Secondary | ICD-10-CM | POA: Insufficient documentation

## 2013-07-09 ENCOUNTER — Ambulatory Visit: Payer: BC Managed Care – PPO | Admitting: Internal Medicine

## 2013-07-11 ENCOUNTER — Ambulatory Visit (INDEPENDENT_AMBULATORY_CARE_PROVIDER_SITE_OTHER): Payer: BC Managed Care – PPO | Admitting: Endocrinology

## 2013-07-11 ENCOUNTER — Encounter: Payer: Self-pay | Admitting: Endocrinology

## 2013-07-11 DIAGNOSIS — E042 Nontoxic multinodular goiter: Secondary | ICD-10-CM

## 2013-07-11 NOTE — Patient Instructions (Addendum)
No treatment is needed for the thyroid now.   most of the time, a "lumpy thyroid" will eventually become overactive.  this is usually a slow process, happening over the span of many years. Please return in 1 year. Let's rechek the calcium.  We'll contact you with results.

## 2013-07-11 NOTE — Progress Notes (Signed)
Subjective:    Patient ID: [name], female    DOB: [birth year], 52 y.o.   MRN: [id number]  HPI Pt was noted in 2007 to have a multinodular goiter.  Also in 2007, bx of the largest right nodule showed "follicular lesion."  Most recent US, in 2013, showed minimal if any change.  She does not notice the goiter.  She has few months of moderately excessive diaphoresis, throughout the body, and assoc fatigue.  She also has heat intolerance.  Past Medical History  Diagnosis Date  . Mitral valve prolapse   . Rapid heart beat     States "irreg heart beat"  . Hemorrhoids   . Anemia   . GERD (gastroesophageal reflux disease)   . Positive PPD   . Genital warts   . Hx of cardiac catheterization 2009    normal coronary arteries  . Hx of menorrhagia     Novasure  . Normal cardiac stress test 03/09/2012    no ischemia  . Hypokalemia   . Thyroid disease     Thyroid nodules  . Hypomagnesemia   . Hypophosphatemia     Past Surgical History  Procedure Laterality Date  . Novasure ablation    . Upper gastrointestinal endoscopy  May 2013  . Cardiac catheterization      x2 with normal results per pt    History   Social History  . Marital Status: Divorced    Spouse Name: N/A    Number of Children: 2  . Years of Education: 16   Occupational History  . dialysis tech    Social History Main Topics  . Smoking status: Never Smoker   . Smokeless tobacco: Never Used  . Alcohol Use: No  . Drug Use: No  . Sexual Activity: Not on file   Other Topics Concern  . Not on file   Social History Narrative   Ms. 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 She lived in [place] and in [place] for a number of years her mom is from [place]. 16 years of education went to college at [employer] lives at home with her son who is in his 44s no pets   Neg ets tob etoh hx PA   6 hours of  sleep   G2P2    TD2010  colonoscopy 2009             Current Outpatient Prescriptions on File Prior to Visit  Medication Sig Dispense Refill  . cholecalciferol (VITAMIN D) 1000 UNITS tablet Take 2,000 Units by mouth every morning.       . metoprolol tartrate (LOPRESSOR) 25 MG tablet Take 25 mg by mouth 2 (two) times daily.       . Multiple Vitamin (MULTIVITAMIN WITH MINERALS) TABS Take 1 tablet by mouth daily.      [name] PARoxetine (PAXIL-CR) 25 MG 24 hr tablet Take 25 mg by mouth every morning.       . potassium chloride SA (K-DUR,KLOR-CON) 20 MEQ tablet Take 40 mEq by mouth 2 (two) times daily.       No current facility-administered medications on file prior to visit.    Allergies  Allergen Reactions  . Ceftriaxone Sodium Itching  . Doxycycline Rash    Family History  Problem Relation Age of Onset  . Heart disease Father     died Mi 21  . Hypertension Maternal Grandfather   .  Diabetes Maternal Grandfather   . Heart disease Maternal Grandfather   . Colon cancer Maternal Grandfather   . Diabetes Maternal Grandmother     BP 102/70  Temp(Src) 97.8 F (36.6 C) (Oral)  Ht 5\' 6"  (1.676 m)  Wt 200 lb 8 oz (90.946 kg)  BMI 32.38 kg/m2  LMP 07/05/2013   Review of Systems She also reports hair loss, but no fever.      Objective:   Physical Exam VITAL SIGNS:  See vs page GENERAL: no distress NECK: There is no palpable thyroid enlargement.  i cannot feel the right thyroid nodule noted on [place].  No palpable lymphadenopathy at the anterior neck.    Lab Results  Component Value Date   TSH 1.048 07/01/2013   T3TOTAL 126.2 06/07/2008   T4TOTAL 6.6 06/07/2008   Lab Results  Component Value Date   PTH 31.1 07/04/2012   CALCIUM 9.0 02/16/2013   CAION 1.30* 07/01/2013   PHOS 2.2* 10/26/2012      Assessment & Plan:  Multinodular goiter; no clinical evidence of change. Excessive diaphoresis: not thyroid-related Hair loss: not thyroid-related Hypercalcemia, new, uncertain  etiology

## 2013-07-18 ENCOUNTER — Other Ambulatory Visit (HOSPITAL_COMMUNITY): Payer: Self-pay | Admitting: Internal Medicine

## 2013-07-19 ENCOUNTER — Other Ambulatory Visit: Payer: Self-pay | Admitting: Family Medicine

## 2013-07-19 MED ORDER — POTASSIUM CHLORIDE CRYS ER 20 MEQ PO TBCR: 40.0000 meq | EXTENDED_RELEASE_TABLET | Freq: Two times a day (BID) | ORAL | Status: DC

## 2013-07-19 NOTE — Telephone Encounter (Signed)
Pt call med into [facility] [address]. Pt is at work. klor-con 20 meq. Please  Call pt at work [phone]. Pt is out

## 2013-09-03 ENCOUNTER — Telehealth: Payer: Self-pay | Admitting: Internal Medicine

## 2013-09-03 NOTE — Telephone Encounter (Signed)
[facility] Triage Call Report Triage Record Num: [id number] Operator: Alphonsa Overall Patient Name: [name] Call Date & Time: 09/01/2013 5:46:30PM Patient Phone: [phone] PCP: Neta Mends. Panosh Patient Gender: Female PCP Fax : [phone] Patient DOB: 03/30/1961 [facility] Name[facility] Reason for Call: Caller: [name]; PCP: Berniece Andreas (Family Practice); CB#: [id number]; Call regarding Abscess in mouth. Onset 08/31/13. Pain in mouth. Draining 09/01/13 at 1530. Afebrile. Chills. Upper left front jaw. Pt wanting abx. Dr Beverely Low on call and orders Augmentin 875/125 tab 1 tab BID x 10days with food f/u dentist 09/03/13. [facility] [address] [phone]. Called in and pt aware and will follow plan. Protocol(s) Used: Teeth and Jaw Symptoms Recommended Outcome per Protocol: Provide Home/Self Care Reason for Outcome: All other situations Care Advice: ~ Consider dental consultation. ~ Call your dentist if you develop pain or heat or cold sensitivity. Apply a warm or cool compress, or a cloth-covered ice pack, whichever feels better, to the area for 20 minutes every 2-3 hours while awake to relieve discomfort. ~ ~ Call provider if symptoms continue, worsen, or new symptoms develop. Call provider if temperature is 101.5 F (38.6 C) or greater or any temperature elevation in an immunocompromised patient (such as diabetes, HIV/AIDS, renal disease, chemotherapy, organ transplant, or chronic steroid use) ~ Eat soft foods (mashed or baked potatoes, Jello, cooked cereal, applesauce, bananas, eggs, cottage cheese, soups, pureed foods or "smoothies") until pain resolves or see provider. ~ ~ SYMPTOM / CONDITION MANAGEMENT ~ Avoid foods or drinks that cause or worsen symptoms. 12/

## 2013-09-25 ENCOUNTER — Encounter: Payer: 59 | Admitting: Internal Medicine

## 2013-09-25 ENCOUNTER — Encounter: Payer: Self-pay | Admitting: Internal Medicine

## 2013-09-25 NOTE — Progress Notes (Signed)
Document opened and reviewed for OV but  Pt came 20 minutes  late for 15 min appt  Offered to resched  Work in later same day but pt   canceled  .

## 2013-10-02 ENCOUNTER — Emergency Department (HOSPITAL_COMMUNITY)
Admission: EM | Admit: 2013-10-02 | Discharge: 2013-10-02 | Disposition: A | Discharge status: Home / Self Care | Payer: 59 | Source: Home / Self Care | Attending: Family Medicine | Admitting: Family Medicine

## 2013-10-02 ENCOUNTER — Encounter (HOSPITAL_COMMUNITY): Payer: Self-pay | Admitting: Emergency Medicine

## 2013-10-02 DIAGNOSIS — R197 Diarrhea, unspecified: Secondary | ICD-10-CM

## 2013-10-02 LAB — CLOSTRIDIUM DIFFICILE BY PCR: Toxigenic C. Difficile by PCR: NEGATIVE

## 2013-10-02 NOTE — ED Notes (Signed)
C/o nausea and diarrhea since Saturday.   States that stools are green.  Denies any other symptoms.

## 2013-10-02 NOTE — Discharge Instructions (Signed)
I will call you with results tomorrow or the next day.  If you get worse go to the ER.  See your doctor soon.  If your belly pain worsens, or you have high fever, bad vomiting, blood in your stool or black tarry stool go to the Emergency Room.

## 2013-10-02 NOTE — ED Provider Notes (Signed)
[name] is a 53 y.o. female who presents to Urgent Care today for diarrhea present for the last 6 days. Patient notes up to 4 stools a day. She notes that her stool is green in color. She denies any significant abdominal pain fevers or chills. She notes some nausea but denies any vomiting. She denies any blood in her stool. She notes that she was treated with antibiotics for a dental abscess about 4 weeks ago.   Past Medical History  Diagnosis Date  . Mitral valve prolapse   . Rapid heart beat     States "irreg heart beat"  . Hemorrhoids   . Anemia   . GERD (gastroesophageal reflux disease)   . Positive PPD   . Genital warts   . Hx of cardiac catheterization 2009    normal coronary arteries  . Hx of menorrhagia     Novasure  . Normal cardiac stress test 03/09/2012    no ischemia  . Hypokalemia   . Thyroid disease     Thyroid nodules  . Hypomagnesemia   . Hypophosphatemia    History  Substance Use Topics  . Smoking status: Never Smoker   . Smokeless tobacco: Never Used  . Alcohol Use: No   ROS as above Medications: No current facility-administered medications for this encounter.   Current Outpatient Prescriptions  Medication Sig Dispense Refill  . cholecalciferol (VITAMIN D) 1000 UNITS tablet Take 2,000 Units by mouth every morning.       . diltiazem (CARDIZEM) 30 MG tablet       . metoprolol tartrate (LOPRESSOR) 25 MG tablet Take 25 mg by mouth 2 (two) times daily.       . Multiple Vitamin (MULTIVITAMIN WITH MINERALS) TABS Take 1 tablet by mouth daily.      . pantoprazole (PROTONIX) 40 MG tablet       . PARoxetine (PAXIL-CR) 25 MG 24 hr tablet Take 25 mg by mouth every morning.       . potassium chloride SA (K-DUR,KLOR-CON) 20 MEQ tablet Take 2 tablets (40 mEq total) by mouth 2 (two) times daily.  120 tablet  5    Exam:  BP 124/80  Pulse 60  Temp(Src) 98 F (36.7 C) (Oral)  Resp 18  SpO2 97%  LMP 09/28/2013 Gen: Well NAD HEENT: EOMI,  MMM Lungs: Normal  work of breathing. CTABL Heart: RRR no MRG Abd: NABS, Soft. NT, ND Exts: Brisk capillary refill, warm and well perfused.   No results found for this or any previous visit (from the past 24 hour(s)). No results found.  Assessment and Plan: 53 y.o. female with green diarrhea following antibiotic exposure. I am concerned for Clostridium difficile. Stool sample sent for culture and PCR for C. Difficile.  Call patient with results. Followup with primary care provider.  Discussed warning signs or symptoms. Please see discharge instructions. Patient expresses understanding.    Gregor Hams, MD 10/02/13 1149

## 2013-10-03 ENCOUNTER — Telehealth (HOSPITAL_COMMUNITY): Payer: Self-pay | Admitting: Family Medicine

## 2013-10-03 MED ORDER — METRONIDAZOLE 500 MG PO TABS: 500.0000 mg | ORAL_TABLET | Freq: Three times a day (TID) | ORAL | Status: DC

## 2013-10-03 MED ORDER — CIPROFLOXACIN HCL 500 MG PO TABS: 500.0000 mg | ORAL_TABLET | Freq: Two times a day (BID) | ORAL | Status: DC

## 2013-10-03 NOTE — ED Notes (Signed)
Patient to call back. She continues to be symptomatic. Stool culture is pending. C. difficile PCR is negative. Will treat with antibiotics. Will use ciprofloxacin and Flagyl for one week. Followup if not improving  Gregor Hams, MD 10/03/13 [phone]

## 2013-10-06 ENCOUNTER — Telehealth (HOSPITAL_COMMUNITY): Payer: Self-pay

## 2013-10-06 LAB — STOOL CULTURE: SPECIAL REQUESTS: NORMAL

## 2013-10-06 NOTE — ED Notes (Signed)
Note from Dr Georgina Snell reviewed.  Patient notified that C Diff was negative.  Patient states she is feeling a little better since she started the medicine.  Per Dr Clovis Riley note patient is to continue medications.

## 2013-10-09 ENCOUNTER — Ambulatory Visit (INDEPENDENT_AMBULATORY_CARE_PROVIDER_SITE_OTHER): Payer: 59 | Admitting: Internal Medicine

## 2013-10-09 ENCOUNTER — Encounter: Payer: Self-pay | Admitting: Internal Medicine

## 2013-10-09 DIAGNOSIS — E876 Hypokalemia: Secondary | ICD-10-CM

## 2013-10-09 LAB — COMPREHENSIVE METABOLIC PANEL
ALBUMIN: 3.5 g/dL (ref 3.5–5.2)
ALK PHOS: 59 U/L (ref 39–117)
ALT: 17 U/L (ref 0–35)
AST: 17 U/L (ref 0–37)
BUN: 7 mg/dL (ref 6–23)
CALCIUM: 9.2 mg/dL (ref 8.4–10.5)
CHLORIDE: 107 meq/L (ref 96–112)
CO2: 30 mEq/L (ref 19–32)
Creatinine, Ser: 0.7 mg/dL (ref 0.4–1.2)
GFR: 112 mL/min (ref >60.00)
Glucose, Bld: 72 mg/dL (ref 70–99)
POTASSIUM: 3.9 meq/L (ref 3.5–5.1)
SODIUM: 141 meq/L (ref 135–145)
TOTAL PROTEIN: 6.6 g/dL (ref 6.0–8.3)
Total Bilirubin: 0.8 mg/dL (ref 0.3–1.2)

## 2013-10-09 MED ORDER — MELOXICAM 15 MG PO TABS: 15.0000 mg | ORAL_TABLET | Freq: Every day | ORAL | Status: DC

## 2013-10-09 MED ORDER — CYCLOBENZAPRINE HCL 10 MG PO TABS: 10.0000 mg | ORAL_TABLET | Freq: Three times a day (TID) | ORAL | Status: DC | PRN

## 2013-10-09 NOTE — Progress Notes (Signed)
Subjective:    Patient ID: [name], female    DOB: [birth year], 53 y.o.   MRN: [id number]  HPI  53 year old patient who presents with a chief complaint of posterior neck pain and headaches. She also describes a popping and clicking sensation with head turning. She states as he was diagnosed with a bulging disc in the cervical area in 2003. Symptoms are aggravated by movement of the head and neck but also bothersome at night. She feels her work as an aggravating factor. 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 No radicular symptoms  Past Medical History  Diagnosis Date  . Mitral valve prolapse   . Rapid heart beat     States "irreg heart beat"  . Hemorrhoids   . Anemia   . GERD (gastroesophageal reflux disease)   . Positive PPD   . Genital warts   . Hx of cardiac catheterization 2009    normal coronary arteries  . Hx of menorrhagia     Novasure  . Normal cardiac stress test 03/09/2012    no ischemia  . Hypokalemia   . Thyroid disease     Thyroid nodules  . Hypomagnesemia   . Hypophosphatemia     History   Social History  . Marital Status: Divorced    Spouse Name: N/A    Number of Children: 2  . Years of Education: 16   Occupational History  . dialysis tech    Social History Main Topics  . Smoking status: Never Smoker   . Smokeless tobacco: Never Used  . Alcohol Use: No  . Drug Use: No  . Sexual Activity: Yes   Other Topics Concern  . 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 She lived in [place] and in [place] for a number of years her mom is from [place]. 16 years of education went to college at [employer] lives at home with her son who is in his 60s no pets   Neg ets tob etoh hx PA   6 hours of sleep   G2P2    TD2010  colonoscopy 2009             Past Surgical  History  Procedure Laterality Date  . Novasure ablation    . Upper gastrointestinal endoscopy  May 2013  . Cardiac catheterization      x2 with normal results per pt    Family History  Problem Relation Age of Onset  . Heart disease Father     died Mi 72  . Hypertension Maternal Grandfather   . Diabetes Maternal Grandfather   . Heart disease Maternal Grandfather   . Colon cancer Maternal Grandfather   . Diabetes Maternal Grandmother     Allergies  Allergen Reactions  . Ceftriaxone Sodium Itching  . Doxycycline Rash    Current Outpatient Prescriptions on File Prior to Visit  Medication Sig Dispense Refill  . cholecalciferol (VITAMIN D) 1000 UNITS tablet Take 2,000 Units by mouth every morning.       . ciprofloxacin (CIPRO) 500 MG tablet Take 1 tablet (500 mg total) by mouth 2 (two) times daily.  14 tablet  0  . diltiazem (CARDIZEM) 30 MG tablet       . metoprolol tartrate (LOPRESSOR) 25 MG tablet Take 25 mg by mouth 2 (  two) times daily.       . metroNIDAZOLE (FLAGYL) 500 MG tablet Take 1 tablet (500 mg total) by mouth 3 (three) times daily.  21 tablet  0  . Multiple Vitamin (MULTIVITAMIN WITH MINERALS) TABS Take 1 tablet by mouth daily.      . pantoprazole (PROTONIX) 40 MG tablet       . PARoxetine (PAXIL-CR) 25 MG 24 hr tablet Take 25 mg by mouth every morning.       . potassium chloride SA (K-DUR,KLOR-CON) 20 MEQ tablet Take 2 tablets (40 mEq total) by mouth 2 (two) times daily.  120 tablet  5   No current facility-administered medications on file prior to visit.    BP 120/74  Pulse 66  Temp(Src) 97.8 F (36.6 C) (Oral)  Resp 20  Ht 5\' 6"  (1.676 m)  Wt 204 lb (92.534 kg)  BMI 32.94 kg/m2  SpO2 98%  LMP 09/28/2013       Review of Systems  Constitutional: Negative.   HENT: Negative for congestion, dental problem, hearing loss, rhinorrhea, sinus pressure, sore throat and tinnitus.   Eyes: Negative for pain, discharge and visual disturbance.  Respiratory:  Negative for cough and shortness of breath.   Cardiovascular: Negative for chest pain, palpitations and leg swelling.  Gastrointestinal: Negative for nausea, vomiting, abdominal pain, diarrhea, constipation, blood in stool and abdominal distention.  Genitourinary: Negative for dysuria, urgency, frequency, hematuria, flank pain, vaginal bleeding, vaginal discharge, difficulty urinating, vaginal pain and pelvic pain.  Musculoskeletal: Positive for neck pain. Negative for arthralgias, gait problem and joint swelling.  Skin: Negative for rash.  Neurological: Positive for headaches. Negative for dizziness, syncope, speech difficulty, weakness and numbness.  Hematological: Negative for adenopathy.  Psychiatric/Behavioral: Negative for behavioral problems, dysphoric mood and agitation. The patient is not nervous/anxious.        Objective:   Physical Exam  Constitutional: She appears well-developed and well-nourished. No distress.  Neck: Normal range of motion. Neck supple.  Range of motion of the neck intact throat uncomfortable especially head turning to the left and right Posterior neck musculature fairly normal          Assessment & Plan:   Neck pain and associated headaches. A cortisone shot was offered but the patient will like to defer. Will place on Mobic and a bedtime dose so they must relax her. The patient will consider a soft cervical collar heat therapy and gentle massage. She will call if unimproved

## 2013-10-09 NOTE — Patient Instructions (Signed)
Cervical Sprain A cervical sprain is an injury in the neck in which the strong, fibrous tissues (ligaments) that connect your neck bones stretch or tear. Cervical sprains can range from mild to severe. Severe cervical sprains can cause the neck vertebrae to be unstable. This can lead to damage of the spinal cord and can result in serious nervous system problems. The amount of time it takes for a cervical sprain to get better depends on the cause and extent of the injury. Most cervical sprains heal in 1 to 3 weeks. CAUSES  Severe cervical sprains may be caused by:   Contact sport injuries (such as from football, rugby, wrestling, hockey, auto racing, gymnastics, diving, martial arts, or boxing).   Motor vehicle collisions.   Whiplash injuries. This is an injury from a sudden forward-and backward whipping movement of the head and neck.  Falls.  Mild cervical sprains may be caused by:   Being in an awkward position, such as while cradling a telephone between your ear and shoulder.   Sitting in a chair that does not offer proper support.   Working at a poorly designed computer station.   Looking up or down for long periods of time.  SYMPTOMS   Pain, soreness, stiffness, or a burning sensation in the front, back, or sides of the neck. This discomfort may develop immediately after the injury or slowly, 24 hours or more after the injury.   Pain or tenderness directly in the middle of the back of the neck.   Shoulder or upper back pain.   Limited ability to move the neck.   Headache.   Dizziness.   Weakness, numbness, or tingling in the hands or arms.   Muscle spasms.   Difficulty swallowing or chewing.   Tenderness and swelling of the neck.  DIAGNOSIS  Most of the time your health care provider can diagnose a cervical sprain by taking your history and doing a physical exam. Your health care provider will ask about previous neck injuries and any known neck  problems, such as arthritis in the neck. X-rays may be taken to find out if there are any other problems, such as with the bones of the neck. Other tests, such as a CT scan or MRI, may also be needed.  TREATMENT  Treatment depends on the severity of the cervical sprain. Mild sprains can be treated with rest, keeping the neck in place (immobilization), and pain medicines. Severe cervical sprains are immediately immobilized. Further treatment is done to help with pain, muscle spasms, and other symptoms and may include:  Medicines, such as pain relievers, numbing medicines, or muscle relaxants.   Physical therapy. This may involve stretching exercises, strengthening exercises, and posture training. Exercises and improved posture can help stabilize the neck, strengthen muscles, and help stop symptoms from returning.  HOME CARE INSTRUCTIONS   Put ice on the injured area.   Put ice in a plastic bag.   Place a towel between your skin and the bag.   Leave the ice on for 15 20 minutes, 3 4 times a day.   If your injury was severe, you may have been given a cervical collar to wear. A cervical collar is a two-piece collar designed to keep your neck from moving while it heals.  Do not remove the collar unless instructed by your health care provider.  If you have long hair, keep it outside of the collar.  Ask your health care provider before making any adjustments to your collar.   Minor adjustments may be required over time to improve comfort and reduce pressure on your chin or on the back of your head.  Ifyou are allowed to remove the collar for cleaning or bathing, follow your health care provider's instructions on how to do so safely.  Keep your collar clean by wiping it with mild soap and water and drying it completely. If the collar you have been given includes removable pads, remove them every 1 2 days and hand wash them with soap and water. Allow them to air dry. They should be completely  dry before you wear them in the collar.  If you are allowed to remove the collar for cleaning and bathing, wash and dry the skin of your neck. Check your skin for irritation or sores. If you see any, tell your health care provider.  Do not drive while wearing the collar.   Only take over-the-counter or prescription medicines for pain, discomfort, or fever as directed by your health care provider.   Keep all follow-up appointments as directed by your health care provider.   Keep all physical therapy appointments as directed by your health care provider.   Make any needed adjustments to your workstation to promote good posture.   Avoid positions and activities that make your symptoms worse.   Warm up and stretch before being active to help prevent problems.  SEEK MEDICAL CARE IF:   Your pain is not controlled with medicine.   You are unable to decrease your pain medicine over time as planned.   Your activity level is not improving as expected.  SEEK IMMEDIATE MEDICAL CARE IF:   You develop any bleeding.  You develop stomach upset.  You have signs of an allergic reaction to your medicine.   Your symptoms get worse.   You develop new, unexplained symptoms.   You have numbness, tingling, weakness, or paralysis in any part of your body.  MAKE SURE YOU:   Understand these instructions.  Will watch your condition.  Will get help right away if you are not doing well or get worse. Document Released: 06/27/2007 Document Revised: 06/20/2013 Document Reviewed: 03/07/2013 [employer] Patient Information 2014 [employer].  

## 2013-10-09 NOTE — Progress Notes (Signed)
Pre-visit discussion using our clinic review tool. No additional management support is needed unless otherwise documented below in the visit note.  

## 2013-10-12 ENCOUNTER — Telehealth: Payer: Self-pay | Admitting: *Deleted

## 2013-10-12 NOTE — Telephone Encounter (Signed)
CMP NORMAL

## 2013-10-12 NOTE — Telephone Encounter (Signed)
Patient would like lab results.

## 2013-10-12 NOTE — Telephone Encounter (Signed)
Rx written; called to make pt aware. Called the work and received the vm.  Left a message for return call on cell phone.

## 2013-10-12 NOTE — Telephone Encounter (Signed)
Spoke with pt and pt is aware of lab results.  Pt states Dr. Raliegh Ip told her to wear a neck brace and pt needs an order for it.  Pls advise.

## 2013-10-12 NOTE — Telephone Encounter (Signed)
Write for soft cervical collar

## 2013-10-16 ENCOUNTER — Ambulatory Visit (INDEPENDENT_AMBULATORY_CARE_PROVIDER_SITE_OTHER): Payer: 59

## 2013-10-16 ENCOUNTER — Ambulatory Visit (INDEPENDENT_AMBULATORY_CARE_PROVIDER_SITE_OTHER): Payer: 59 | Admitting: Podiatry

## 2013-10-16 ENCOUNTER — Encounter: Payer: Self-pay | Admitting: Podiatry

## 2013-10-16 VITALS — BP 107/68 | HR 64 | Resp 16 | Ht 66.0 in | Wt 197.0 lb

## 2013-10-16 DIAGNOSIS — G5760 Lesion of plantar nerve, unspecified lower limb: Secondary | ICD-10-CM

## 2013-10-16 DIAGNOSIS — M722 Plantar fascial fibromatosis: Secondary | ICD-10-CM

## 2013-10-16 DIAGNOSIS — G5761 Lesion of plantar nerve, right lower limb: Secondary | ICD-10-CM

## 2013-10-16 MED ORDER — MELOXICAM 15 MG PO TABS: 15.0000 mg | ORAL_TABLET | Freq: Every day | ORAL | Status: DC

## 2013-10-16 MED ORDER — METHYLPREDNISOLONE (PAK) 4 MG PO TABS: ORAL_TABLET | ORAL | Status: DC

## 2013-10-16 NOTE — Patient Instructions (Signed)
Plantar Fasciitis (Heel Spur Syndrome)  with Rehab  The plantar fascia is a fibrous, ligament-like, soft-tissue structure that spans the bottom of the foot. Plantar fasciitis is a condition that causes pain in the foot due to inflammation of the tissue.  SYMPTOMS   · Pain and tenderness on the underneath side of the foot.  · Pain that worsens with standing or walking.  CAUSES   Plantar fasciitis is caused by irritation and injury to the plantar fascia on the underneath side of the foot. Common mechanisms of injury include:  · Direct trauma to bottom of the foot.  · Damage to a small nerve that runs under the foot where the main fascia attaches to the heel bone.  · Stress placed on the plantar fascia due to bone spurs.  RISK INCREASES WITH:   · Activities that place stress on the plantar fascia (running, jumping, pivoting, or cutting).  · Poor strength and flexibility.  · Improperly fitted shoes.  · Tight calf muscles.  · Flat feet.  · Failure to warm-up properly before activity.  · Obesity.  PREVENTION  · Warm up and stretch properly before activity.  · Allow for adequate recovery between workouts.  · Maintain physical fitness:  · Strength, flexibility, and endurance.  · Cardiovascular fitness.  · Maintain a health body weight.  · Avoid stress on the plantar fascia.  · Wear properly fitted shoes, including arch supports for individuals who have flat feet.  PROGNOSIS   If treated properly, then the symptoms of plantar fasciitis usually resolve without surgery. However, occasionally surgery is necessary.  RELATED COMPLICATIONS   · Recurrent symptoms that may result in a chronic condition.  · Problems of the lower back that are caused by compensating for the injury, such as limping.  · Pain or weakness of the foot during push-off following surgery.  · Chronic inflammation, scarring, and partial or complete fascia tear, occurring more often from repeated injections.  TREATMENT   Treatment initially involves the use of  ice and medication to help reduce pain and inflammation. The use of strengthening and stretching exercises may help reduce pain with activity, especially stretches of the Achilles tendon. These exercises may be performed at home or with a therapist. Your caregiver may recommend that you use heel cups of arch supports to help reduce stress on the plantar fascia. Occasionally, corticosteroid injections are given to reduce inflammation. If symptoms persist for greater than 6 months despite non-surgical (conservative), then surgery may be recommended.   MEDICATION   · If pain medication is necessary, then nonsteroidal anti-inflammatory medications, such as aspirin and ibuprofen, or other minor pain relievers, such as acetaminophen, are often recommended.  · Do not take pain medication within 7 days before surgery.  · Prescription pain relievers may be given if deemed necessary by your caregiver. Use only as directed and only as much as you need.  · Corticosteroid injections may be given by your caregiver. These injections should be reserved for the most serious cases, because they may only be given a certain number of times.  HEAT AND COLD  · Cold treatment (icing) relieves pain and reduces inflammation. Cold treatment should be applied for 10 to 15 minutes every 2 to 3 hours for inflammation and pain and immediately after any activity that aggravates your symptoms. Use ice packs or massage the area with a piece of ice (ice massage).  · Heat treatment may be used prior to performing the stretching and strengthening activities prescribed   by your caregiver, physical therapist, or athletic trainer. Use a heat pack or soak the injury in warm water.  SEEK IMMEDIATE MEDICAL CARE IF:  · Treatment seems to offer no benefit, or the condition worsens.  · Any medications produce adverse side effects.  EXERCISES  RANGE OF MOTION (ROM) AND STRETCHING EXERCISES - Plantar Fasciitis (Heel Spur Syndrome)  These exercises may help you  when beginning to rehabilitate your injury. Your symptoms may resolve with or without further involvement from your physician, physical therapist or athletic trainer. While completing these exercises, remember:   · Restoring tissue flexibility helps normal motion to return to the joints. This allows healthier, less painful movement and activity.  · An effective stretch should be held for at least 30 seconds.  · A stretch should never be painful. You should only feel a gentle lengthening or release in the stretched tissue.  RANGE OF MOTION - Toe Extension, Flexion  · Sit with your right / left leg crossed over your opposite knee.  · Grasp your toes and gently pull them back toward the top of your foot. You should feel a stretch on the bottom of your toes and/or foot.  · Hold this stretch for __________ seconds.  · Now, gently pull your toes toward the bottom of your foot. You should feel a stretch on the top of your toes and or foot.  · Hold this stretch for __________ seconds.  Repeat __________ times. Complete this stretch __________ times per day.   RANGE OF MOTION - Ankle Dorsiflexion, Active Assisted  · Remove shoes and sit on a chair that is preferably not on a carpeted surface.  · Place right / left foot under knee. Extend your opposite leg for support.  · Keeping your heel down, slide your right / left foot back toward the chair until you feel a stretch at your ankle or calf. If you do not feel a stretch, slide your bottom forward to the edge of the chair, while still keeping your heel down.  · Hold this stretch for __________ seconds.  Repeat __________ times. Complete this stretch __________ times per day.   STRETCH  Gastroc, Standing  · Place hands on wall.  · Extend right / left leg, keeping the front knee somewhat bent.  · Slightly point your toes inward on your back foot.  · Keeping your right / left heel on the floor and your knee straight, shift your weight toward the wall, not allowing your back to  arch.  · You should feel a gentle stretch in the right / left calf. Hold this position for __________ seconds.  Repeat __________ times. Complete this stretch __________ times per day.  STRETCH  Soleus, Standing  · Place hands on wall.  · Extend right / left leg, keeping the other knee somewhat bent.  · Slightly point your toes inward on your back foot.  · Keep your right / left heel on the floor, bend your back knee, and slightly shift your weight over the back leg so that you feel a gentle stretch deep in your back calf.  · Hold this position for __________ seconds.  Repeat __________ times. Complete this stretch __________ times per day.  STRETCH  Gastrocsoleus, Standing   Note: This exercise can place a lot of stress on your foot and ankle. Please complete this exercise only if specifically instructed by your caregiver.   · Place the ball of your right / left foot on a step, keeping   your other foot firmly on the same step.  · Hold on to the wall or a rail for balance.  · Slowly lift your other foot, allowing your body weight to press your heel down over the edge of the step.  · You should feel a stretch in your right / left calf.  · Hold this position for __________ seconds.  · Repeat this exercise with a slight bend in your right / left knee.  Repeat __________ times. Complete this stretch __________ times per day.   STRENGTHENING EXERCISES - Plantar Fasciitis (Heel Spur Syndrome)   These exercises may help you when beginning to rehabilitate your injury. They may resolve your symptoms with or without further involvement from your physician, physical therapist or athletic trainer. While completing these exercises, remember:   · Muscles can gain both the endurance and the strength needed for everyday activities through controlled exercises.  · Complete these exercises as instructed by your physician, physical therapist or athletic trainer. Progress the resistance and repetitions only as guided.  STRENGTH - Towel  Curls  · Sit in a chair positioned on a non-carpeted surface.  · Place your foot on a towel, keeping your heel on the floor.  · Pull the towel toward your heel by only curling your toes. Keep your heel on the floor.  · If instructed by your physician, physical therapist or athletic trainer, add ____________________ at the end of the towel.  Repeat __________ times. Complete this exercise __________ times per day.  STRENGTH - Ankle Inversion  · Secure one end of a rubber exercise band/tubing to a fixed object (table, pole). Loop the other end around your foot just before your toes.  · Place your fists between your knees. This will focus your strengthening at your ankle.  · Slowly, pull your big toe up and in, making sure the band/tubing is positioned to resist the entire motion.  · Hold this position for __________ seconds.  · Have your muscles resist the band/tubing as it slowly pulls your foot back to the starting position.  Repeat __________ times. Complete this exercises __________ times per day.   Document Released: 08/30/2005 Document Revised: 11/22/2011 Document Reviewed: 12/12/2008  [employer]® Patient Information ©2014 [employer].

## 2013-10-16 NOTE — Progress Notes (Signed)
   Subjective:    Patient ID: [name], female    DOB: 07/21/1961, 53 y.o.   MRN: [id number]  HPI Comments: Issues with both feet , but my main concern is the left heel, sore plantar left heel, its been going on about a week , rubbed foot on a ball . The right foot has had pain and now numbness its between 3/4 toes . That has been going on for a couple of years.      Review of Systems  Constitutional:       Sweating   Gastrointestinal: Positive for constipation.  Musculoskeletal: Positive for back pain.  All other systems reviewed and are negative.       Objective:   Physical Exam I have reviewed her past medical history medications allergies surgeries social history and review of systems. At this point pulses are strongly palpable bilateral. Capillary fill time to digits one through 5 is noted immediate. Deep tendon reflexes are intact bilateral muscle strength is 5 over 5 dorsiflexors plantar flexors inverters everters all intrinsic musculature is intact. Orthopedic evaluation demonstrates all joints distal to the ankle have a full range of motion without crepitus mild tenderness on palpation overlying the third interdigital space of the right foot. Left foot demonstrates pain on palpation of the medial calcaneal tubercle of the left heel. Radiographic evaluation does demonstrate soft tissue increase in density at the plantar fascial calcaneal insertion site of the left heel. Indicative of plantar fasciitis.        Assessment & Plan:  Assessment: Plantar fasciitis left. Probable neuroma third interdigital space right foot.  Plan: We discussed the etiology pathology conservative versus surgical therapies. I injected her left heel today. Plantar fascial brace was dispensed. A night splint was dispensed. Prescription was written for Medrol Dosepak to be followed by Mobic. Both oral and written home-going instructions were provided. We discussed appropriate shoe gear stretching  exercises ice therapy and shoe gear modifications. I will followup with her in one month.

## 2013-11-08 ENCOUNTER — Emergency Department (HOSPITAL_COMMUNITY)
Admission: EM | Admit: 2013-11-08 | Discharge: 2013-11-08 | Disposition: A | Discharge status: Home / Self Care | Payer: 59 | Attending: Emergency Medicine | Admitting: Emergency Medicine

## 2013-11-08 ENCOUNTER — Encounter (HOSPITAL_COMMUNITY): Payer: Self-pay | Admitting: Emergency Medicine

## 2013-11-08 DIAGNOSIS — R197 Diarrhea, unspecified: Secondary | ICD-10-CM | POA: Insufficient documentation

## 2013-11-08 DIAGNOSIS — Z79899 Other long term (current) drug therapy: Secondary | ICD-10-CM | POA: Insufficient documentation

## 2013-11-08 DIAGNOSIS — Z9889 Other specified postprocedural states: Secondary | ICD-10-CM | POA: Insufficient documentation

## 2013-11-08 DIAGNOSIS — Z3202 Encounter for pregnancy test, result negative: Secondary | ICD-10-CM | POA: Insufficient documentation

## 2013-11-08 DIAGNOSIS — I059 Rheumatic mitral valve disease, unspecified: Secondary | ICD-10-CM | POA: Insufficient documentation

## 2013-11-08 DIAGNOSIS — B349 Viral infection, unspecified: Secondary | ICD-10-CM

## 2013-11-08 DIAGNOSIS — B9789 Other viral agents as the cause of diseases classified elsewhere: Secondary | ICD-10-CM | POA: Insufficient documentation

## 2013-11-08 DIAGNOSIS — Z862 Personal history of diseases of the blood and blood-forming organs and certain disorders involving the immune mechanism: Secondary | ICD-10-CM | POA: Insufficient documentation

## 2013-11-08 DIAGNOSIS — Z8619 Personal history of other infectious and parasitic diseases: Secondary | ICD-10-CM | POA: Insufficient documentation

## 2013-11-08 DIAGNOSIS — Z8639 Personal history of other endocrine, nutritional and metabolic disease: Secondary | ICD-10-CM | POA: Insufficient documentation

## 2013-11-08 DIAGNOSIS — Z8742 Personal history of other diseases of the female genital tract: Secondary | ICD-10-CM | POA: Insufficient documentation

## 2013-11-08 DIAGNOSIS — Z8719 Personal history of other diseases of the digestive system: Secondary | ICD-10-CM | POA: Insufficient documentation

## 2013-11-08 LAB — COMPREHENSIVE METABOLIC PANEL
ALK PHOS: 80 U/L (ref 39–117)
ALT: 18 U/L (ref 0–35)
AST: 21 U/L (ref 0–37)
Albumin: 3.7 g/dL (ref 3.5–5.2)
BILIRUBIN TOTAL: 0.4 mg/dL (ref 0.3–1.2)
BUN: 12 mg/dL (ref 6–23)
CHLORIDE: 98 meq/L (ref 96–112)
CO2: 27 meq/L (ref 19–32)
CREATININE: 0.88 mg/dL (ref 0.50–1.10)
Calcium: 9.7 mg/dL (ref 8.4–10.5)
GFR calc Af Amer: 85 mL/min — ABNORMAL LOW (ref >90)
GFR, EST NON AFRICAN AMERICAN: 73 mL/min — AB (ref >90)
Glucose, Bld: 99 mg/dL (ref 70–99)
Potassium: 4 mEq/L (ref 3.7–5.3)
Sodium: 136 mEq/L — ABNORMAL LOW (ref 137–147)
Total Protein: 7.1 g/dL (ref 6.0–8.3)

## 2013-11-08 LAB — CBC WITH DIFFERENTIAL/PLATELET
Basophils Absolute: 0 10*3/uL (ref 0.0–0.1)
Basophils Relative: 0 % (ref 0–1)
Eosinophils Absolute: 0.1 10*3/uL (ref 0.0–0.7)
Eosinophils Relative: 1 % (ref 0–5)
HEMATOCRIT: 41 % (ref 36.0–46.0)
Hemoglobin: 13.3 g/dL (ref 12.0–15.0)
LYMPHS PCT: 4 % — AB (ref 12–46)
Lymphs Abs: 0.3 10*3/uL — ABNORMAL LOW (ref 0.7–4.0)
MCH: 24.4 pg — ABNORMAL LOW (ref 26.0–34.0)
MCHC: 32.4 g/dL (ref 30.0–36.0)
MCV: 75.1 fL — ABNORMAL LOW (ref 78.0–100.0)
Monocytes Absolute: 1.1 10*3/uL — ABNORMAL HIGH (ref 0.1–1.0)
Monocytes Relative: 15 % — ABNORMAL HIGH (ref 3–12)
NEUTROS ABS: 6.1 10*3/uL (ref 1.7–7.7)
Neutrophils Relative %: 80 % — ABNORMAL HIGH (ref 43–77)
Platelets: 194 10*3/uL (ref 150–400)
RBC: 5.46 MIL/uL — ABNORMAL HIGH (ref 3.87–5.11)
RDW: 13.8 % (ref 11.5–15.5)
WBC: 7.6 10*3/uL (ref 4.0–10.5)

## 2013-11-08 LAB — URINALYSIS, ROUTINE W REFLEX MICROSCOPIC
BILIRUBIN URINE: NEGATIVE
Glucose, UA: NEGATIVE mg/dL
Ketones, ur: NEGATIVE mg/dL
Nitrite: NEGATIVE
PROTEIN: NEGATIVE mg/dL
Specific Gravity, Urine: 1.016 (ref 1.005–1.030)
Urobilinogen, UA: 0.2 mg/dL (ref 0.0–1.0)
pH: 5.5 (ref 5.0–8.0)

## 2013-11-08 LAB — WET PREP, GENITAL
Clue Cells Wet Prep HPF POC: NONE SEEN
Trich, Wet Prep: NONE SEEN
Yeast Wet Prep HPF POC: NONE SEEN

## 2013-11-08 LAB — URINE MICROSCOPIC-ADD ON

## 2013-11-08 LAB — LIPASE, BLOOD: Lipase: 28 U/L (ref 11–59)

## 2013-11-08 LAB — PREGNANCY, URINE: Preg Test, Ur: NEGATIVE

## 2013-11-08 MED ORDER — KETOROLAC TROMETHAMINE 15 MG/ML IJ SOLN
30.0000 mg | Freq: Once | INTRAMUSCULAR | Status: DC
  Filled 2013-11-08: qty 2

## 2013-11-08 MED ORDER — PROMETHAZINE HCL 12.5 MG RE SUPP: 12.5000 mg | Freq: Four times a day (QID) | RECTAL | Status: DC | PRN

## 2013-11-08 MED ORDER — DICYCLOMINE HCL 10 MG/ML IM SOLN
20.0000 mg | Freq: Once | INTRAMUSCULAR | Status: AC
  Administered 2013-11-08: 20 mg via INTRAMUSCULAR
  Filled 2013-11-08: qty 2

## 2013-11-08 MED ORDER — ONDANSETRON HCL 4 MG/2ML IJ SOLN
4.0000 mg | Freq: Once | INTRAMUSCULAR | Status: AC
  Administered 2013-11-08: 4 mg via INTRAVENOUS
  Filled 2013-11-08: qty 2

## 2013-11-08 MED ORDER — GI COCKTAIL ~~LOC~~
30.0000 mL | Freq: Once | ORAL | Status: AC
  Administered 2013-11-08: 30 mL via ORAL
  Filled 2013-11-08: qty 30

## 2013-11-08 NOTE — ED Provider Notes (Addendum)
CSN: [id number]     Arrival date & time 11/08/13  9563 History   First MD Initiated Contact with Patient 11/08/13 [phone]     Chief Complaint  Patient presents with  . Abdominal Pain     (Consider location/radiation/quality/duration/timing/severity/associated sxs/prior Treatment) Patient is a 53 y.o. female presenting with cramps. The history is provided by the patient. No language interpreter was used.  Abdominal Cramping This is a new problem. The current episode started 6 to 12 hours ago. The problem occurs constantly. The problem has not changed since onset.Associated symptoms include abdominal pain. Pertinent negatives include no chest pain, no headaches and no shortness of breath. Nothing aggravates the symptoms. Nothing relieves the symptoms. She has tried nothing for the symptoms. The treatment provided no relief.  Also nausea and loose stool.  No vomiting yet  Past Medical History  Diagnosis Date  . Mitral valve prolapse   . Rapid heart beat     States "irreg heart beat"  . Hemorrhoids   . Anemia   . GERD (gastroesophageal reflux disease)   . Positive PPD   . Genital warts   . Hx of cardiac catheterization 2009    normal coronary arteries  . Hx of menorrhagia     Novasure  . Normal cardiac stress test 03/09/2012    no ischemia  . Hypokalemia   . Thyroid disease     Thyroid nodules  . Hypomagnesemia   . Hypophosphatemia    Past Surgical History  Procedure Laterality Date  . Novasure ablation    . Upper gastrointestinal endoscopy  May 2013  . Cardiac catheterization      x2 with normal results per pt   Family History  Problem Relation Age of Onset  . Heart disease Father     died Mi 54  . Hypertension Maternal Grandfather   . Diabetes Maternal Grandfather   . Heart disease Maternal Grandfather   . Colon cancer Maternal Grandfather   . Diabetes Maternal Grandmother    History  Substance Use Topics  . Smoking status: Never Smoker   . Smokeless tobacco: Never  Used  . Alcohol Use: No   OB History   Grav Para Term Preterm Abortions TAB SAB Ect Mult Living                 Review of Systems  Respiratory: Negative for shortness of breath.   Cardiovascular: Negative for chest pain.  Gastrointestinal: Positive for nausea, abdominal pain and diarrhea.  Neurological: Negative for headaches.  All other systems reviewed and are negative.      Allergies  Ceftriaxone sodium and Doxycycline  Home Medications   Current Outpatient Rx  Name  Route  Sig  Dispense  Refill  . calcium carbonate (TUMS - DOSED IN MG ELEMENTAL CALCIUM) 500 MG chewable tablet   Oral   Chew 1 tablet by mouth daily as needed for indigestion or heartburn.         . cholecalciferol (VITAMIN D) 1000 UNITS tablet   Oral   Take 2,000 Units by mouth every morning.          . metoprolol tartrate (LOPRESSOR) 25 MG tablet   Oral   Take 25 mg by mouth 2 (two) times daily.          [name] PARoxetine (PAXIL-CR) 25 MG 24 hr tablet   Oral   Take 25 mg by mouth every morning.          . potassium chloride SA (K-DUR,KLOR-CON)  20 MEQ tablet   Oral   Take 2 tablets (40 mEq total) by mouth 2 (two) times daily.   120 tablet   5   . vitamin C (ASCORBIC ACID) 500 MG tablet   Oral   Take 500 mg by mouth daily.         . vitamin E 400 UNIT capsule   Oral   Take 400 Units by mouth daily.          BP 126/76  Pulse 87  Temp(Src) 99.7 F (37.6 C) (Oral)  Resp 20  SpO2 99%  LMP 10/24/2013 Physical Exam  Constitutional: She is oriented to person, place, and time. She appears well-developed and well-nourished.  HENT:  Head: Normocephalic and atraumatic.  Mouth/Throat: Oropharynx is clear and moist.  Eyes: Conjunctivae are normal. Pupils are equal, round, and reactive to light.  Neck: Normal range of motion. Neck supple.  Cardiovascular: Normal rate, regular rhythm and intact distal pulses.   Pulmonary/Chest: Effort normal and breath sounds normal. She has no wheezes.  She has no rales.  Abdominal: Soft. She exhibits no distension and no mass. Bowel sounds are increased. There is no tenderness. There is no rebound and no guarding.  Musculoskeletal: Normal range of motion.  Neurological: She is alert and oriented to person, place, and time.  Skin: Skin is warm and dry.  Psychiatric: She has a normal mood and affect.    ED Course  Procedures (including critical care time) Labs Review Labs Reviewed  CBC WITH DIFFERENTIAL - Abnormal; Notable for the following:    RBC 5.46 (*)    MCV 75.1 (*)    MCH 24.4 (*)    Neutrophils Relative % 80 (*)    Lymphocytes Relative 4 (*)    Lymphs Abs 0.3 (*)    Monocytes Relative 15 (*)    Monocytes Absolute 1.1 (*)    All other components within normal limits  COMPREHENSIVE METABOLIC PANEL - Abnormal; Notable for the following:    Sodium 136 (*)    GFR calc non Af Amer 73 (*)    GFR calc Af Amer 85 (*)    All other components within normal limits  URINALYSIS, ROUTINE W REFLEX MICROSCOPIC - Abnormal; Notable for the following:    APPearance CLOUDY (*)    Hgb urine dipstick TRACE (*)    Leukocytes, UA SMALL (*)    All other components within normal limits  URINE MICROSCOPIC-ADD ON - Abnormal; Notable for the following:    Squamous Epithelial / LPF FEW (*)    All other components within normal limits  WET PREP, GENITAL  GC/CHLAMYDIA PROBE AMP  LIPASE, BLOOD  PREGNANCY, URINE   Imaging Review No results found.  EKG Interpretation   None       MDM   Final diagnoses:  None    Symptoms consistent with viral illness.  Patient has gas and anticipate she will be getting some additional loose stool.    Exam and vitals benign no indication for imaging at this time.  Will prescribe antiemetics.  Strict return precautions given.    Carlisle Beers, MD 11/08/13 [phone]  Enedelia Martorelli K Jeroline Wolbert-Rasch, MD 11/08/13 [phone]

## 2013-11-08 NOTE — ED Notes (Signed)
Pt reports ab pain and back pain that started 5 pm yesterday.Pt states that the pain feels like a lot of gas. Pt reports taking tums and drinking milk with no relief of symptoms. Pt denies any changes in urine and bowel pattern. Pt states that she is nauseated but no vomiting. Pt alert and oriented and ambulatory in triage.

## 2013-11-08 NOTE — ED Notes (Signed)
Bed: WA25 Expected date:  Expected time:  Means of arrival:  Comments: Tri 1 

## 2013-11-09 LAB — GC/CHLAMYDIA PROBE AMP
CT Probe RNA: NEGATIVE
GC Probe RNA: NEGATIVE

## 2013-11-15 ENCOUNTER — Emergency Department (HOSPITAL_COMMUNITY)
Admission: EM | Admit: 2013-11-15 | Discharge: 2013-11-15 | Disposition: A | Discharge status: Home / Self Care | Payer: 59 | Source: Home / Self Care | Attending: Family Medicine | Admitting: Family Medicine

## 2013-11-15 ENCOUNTER — Encounter (HOSPITAL_COMMUNITY): Payer: Self-pay | Admitting: Emergency Medicine

## 2013-11-15 DIAGNOSIS — J40 Bronchitis, not specified as acute or chronic: Secondary | ICD-10-CM

## 2013-11-15 MED ORDER — PREDNISONE 10 MG PO TABS: 30.0000 mg | ORAL_TABLET | Freq: Every day | ORAL | Status: DC

## 2013-11-15 MED ORDER — GUAIFENESIN-CODEINE 100-10 MG/5ML PO SOLN: 5.0000 mL | Freq: Every evening | ORAL | Status: DC | PRN

## 2013-11-15 MED ORDER — IPRATROPIUM BROMIDE 0.06 % NA SOLN: 2.0000 | Freq: Four times a day (QID) | NASAL | Status: DC

## 2013-11-15 NOTE — ED Provider Notes (Signed)
[name] is a 53 y.o. female who presents to Urgent Care today for cough facial pain and congestion and nasal discharge present for one week. No fever chills nausea vomiting or diarrhea. No shortness of breath.  No chest pain or palpitations. Patient has tried using azithromycin for the last several days which has not helped.   Past Medical History  Diagnosis Date  . Mitral valve prolapse   . Rapid heart beat     States "irreg heart beat"  . Hemorrhoids   . Anemia   . GERD (gastroesophageal reflux disease)   . Positive PPD   . Genital warts   . Hx of cardiac catheterization 2009    normal coronary arteries  . Hx of menorrhagia     Novasure  . Normal cardiac stress test 03/09/2012    no ischemia  . Hypokalemia   . Thyroid disease     Thyroid nodules  . Hypomagnesemia   . Hypophosphatemia    History  Substance Use Topics  . Smoking status: Never Smoker   . Smokeless tobacco: Never Used  . Alcohol Use: No   ROS as above Medications: No current facility-administered medications for this encounter.   Current Outpatient Prescriptions  Medication Sig Dispense Refill  . cholecalciferol (VITAMIN D) 1000 UNITS tablet Take 2,000 Units by mouth every morning.       . metoprolol tartrate (LOPRESSOR) 25 MG tablet Take 25 mg by mouth 2 (two) times daily.       [name] PARoxetine (PAXIL-CR) 25 MG 24 hr tablet Take 25 mg by mouth every morning.       . potassium chloride SA (K-DUR,KLOR-CON) 20 MEQ tablet Take 2 tablets (40 mEq total) by mouth 2 (two) times daily.  120 tablet  5  . promethazine (PHENERGAN) 12.5 MG suppository Place 1 suppository (12.5 mg total) rectally every 6 (six) hours as needed for nausea or vomiting.  12 each  0  . vitamin C (ASCORBIC ACID) 500 MG tablet Take 500 mg by mouth daily.      . vitamin E 400 UNIT capsule Take 400 Units by mouth daily.      . calcium carbonate (TUMS - DOSED IN MG ELEMENTAL CALCIUM) 500 MG chewable tablet Chew 1 tablet by mouth daily as needed  for indigestion or heartburn.      [name] guaiFENesin-codeine 100-10 MG/5ML syrup Take 5 mLs by mouth at bedtime as needed for cough.  120 mL  0  . ipratropium (ATROVENT) 0.06 % nasal spray Place 2 sprays into both nostrils 4 (four) times daily.  15 mL  1  . predniSONE (DELTASONE) 10 MG tablet Take 3 tablets (30 mg total) by mouth daily.  15 tablet  0    Exam:  BP 119/69  Pulse 76  Temp(Src) 98.2 F (36.8 C) (Oral)  Resp 16  SpO2 100%  LMP 10/27/2013 Gen: Well NAD HEENT: EOMI,  MMM, clear nasal discharge. Nontender maxillary sinuses. Normal tympanic membranes bilaterally. Posterior pharynx with cobblestoning. Lungs: Normal work of breathing. CTABL Heart: RRR no MRG Abd: NABS, Soft. NT, ND Exts: Brisk capillary refill, warm and well perfused.   No results found for this or any previous visit (from the past 24 hour(s)). No results found.  Assessment and Plan: 53 y.o. female with viral URI. Possible bronchitis or sinusitis as well. Plan to treat with prednisone, Atrovent nasal spray, and cough suppressant. Continue azithromycin.  Discussed warning signs or symptoms. Please see discharge instructions. Patient expresses understanding.  Gregor Hams, MD 11/15/13 [phone]

## 2013-11-15 NOTE — Discharge Instructions (Signed)
Thank you for coming in today. Call or go to the emergency room if you get worse, have trouble breathing, have chest pains, or palpitations.   Bronchitis Bronchitis is inflammation of the airways that extend from the windpipe into the lungs (bronchi). The inflammation often causes mucus to develop, which leads to a cough. If the inflammation becomes severe, it may cause shortness of breath. CAUSES  Bronchitis may be caused by:   Viral infections.   Bacteria.   Cigarette smoke.   Allergens, pollutants, and other irritants.  SIGNS AND SYMPTOMS  The most common symptom of bronchitis is a frequent cough that produces mucus. Other symptoms include:  Fever.   Body aches.   Chest congestion.   Chills.   Shortness of breath.   Sore throat.  DIAGNOSIS  Bronchitis is usually diagnosed through a medical history and physical exam. Tests, such as chest X-rays, are sometimes done to rule out other conditions.  TREATMENT  You may need to avoid contact with whatever caused the problem (smoking, for example). Medicines are sometimes needed. These may include:  Antibiotics. These may be prescribed if the condition is caused by bacteria.  Cough suppressants. These may be prescribed for relief of cough symptoms.   Inhaled medicines. These may be prescribed to help open your airways and make it easier for you to breathe.   Steroid medicines. These may be prescribed for those with recurrent (chronic) bronchitis. HOME CARE INSTRUCTIONS  Get plenty of rest.   Drink enough fluids to keep your urine clear or pale yellow (unless you have a medical condition that requires fluid restriction). Increasing fluids may help thin your secretions and will prevent dehydration.   Only take over-the-counter or prescription medicines as directed by your health care provider.  Only take antibiotics as directed. Make sure you finish them even if you start to feel better.  Avoid secondhand  smoke, irritating chemicals, and strong fumes. These will make bronchitis worse. If you are a smoker, quit smoking. Consider using nicotine gum or skin patches to help control withdrawal symptoms. Quitting smoking will help your lungs heal faster.   Put a cool-mist humidifier in your bedroom at night to moisten the air. This may help loosen mucus. Change the water in the humidifier daily. You can also run the hot water in your shower and sit in the bathroom with the door closed for 5 10 minutes.   Follow up with your health care provider as directed.   Wash your hands frequently to avoid catching bronchitis again or spreading an infection to others.  SEEK MEDICAL CARE IF: Your symptoms do not improve after 1 week of treatment.  SEEK IMMEDIATE MEDICAL CARE IF:  Your fever increases.  You have chills.   You have chest pain.   You have worsening shortness of breath.   You have bloody sputum.  You faint.  You have lightheadedness.  You have a severe headache.   You vomit repeatedly. MAKE SURE YOU:   Understand these instructions.  Will watch your condition.  Will get help right away if you are not doing well or get worse. Document Released: 08/30/2005 Document Revised: 06/20/2013 Document Reviewed: 04/24/2013 [facility] Patient Information 2014 [place]. Sinusitis Sinusitis is redness, soreness, and swelling (inflammation) of the paranasal sinuses. Paranasal sinuses are air pockets within the bones of your face (beneath the eyes, the middle of the forehead, or above the eyes). In healthy paranasal sinuses, mucus is able to drain out, and air is able to  circulate through them by way of your nose. However, when your paranasal sinuses are inflamed, mucus and air can become trapped. This can allow bacteria and other germs to grow and cause infection. Sinusitis can develop quickly and last only a short time (acute) or continue over a long period (chronic). Sinusitis that  lasts for more than 12 weeks is considered chronic.  CAUSES  Causes of sinusitis include:  Allergies.  Structural abnormalities, such as displacement of the cartilage that separates your nostrils (deviated septum), which can decrease the air flow through your nose and sinuses and affect sinus drainage.  Functional abnormalities, such as when the small hairs (cilia) that line your sinuses and help remove mucus do not work properly or are not present. SYMPTOMS  Symptoms of acute and chronic sinusitis are the same. The primary symptoms are pain and pressure around the affected sinuses. Other symptoms include:  Upper toothache.  Earache.  Headache.  Bad breath.  Decreased sense of smell and taste.  A cough, which worsens when you are lying flat.  Fatigue.  Fever.  Thick drainage from your nose, which often is green and may contain pus (purulent).  Swelling and warmth over the affected sinuses. DIAGNOSIS  Your caregiver will perform a physical exam. During the exam, your caregiver may:  Look in your nose for signs of abnormal growths in your nostrils (nasal polyps).  Tap over the affected sinus to check for signs of infection.  View the inside of your sinuses (endoscopy) with a special imaging device with a light attached (endoscope), which is inserted into your sinuses. If your caregiver suspects that you have chronic sinusitis, one or more of the following tests may be recommended:  Allergy tests.  Nasal culture A sample of mucus is taken from your nose and sent to a lab and screened for bacteria.  Nasal cytology A sample of mucus is taken from your nose and examined by your caregiver to determine if your sinusitis is related to an allergy. TREATMENT  Most cases of acute sinusitis are related to a viral infection and will resolve on their own within 10 days. Sometimes medicines are prescribed to help relieve symptoms (pain medicine, decongestants, nasal steroid sprays, or  saline sprays).  However, for sinusitis related to a bacterial infection, your caregiver will prescribe antibiotic medicines. These are medicines that will help kill the bacteria causing the infection.  Rarely, sinusitis is caused by a fungal infection. In theses cases, your caregiver will prescribe antifungal medicine. For some cases of chronic sinusitis, surgery is needed. Generally, these are cases in which sinusitis recurs more than 3 times per year, despite other treatments. HOME CARE INSTRUCTIONS   Drink plenty of water. Water helps thin the mucus so your sinuses can drain more easily.  Use a humidifier.  Inhale steam 3 to 4 times a day (for example, sit in the bathroom with the shower running).  Apply a warm, moist washcloth to your face 3 to 4 times a day, or as directed by your caregiver.  Use saline nasal sprays to help moisten and clean your sinuses.  Take over-the-counter or prescription medicines for pain, discomfort, or fever only as directed by your caregiver. SEEK IMMEDIATE MEDICAL CARE IF:  You have increasing pain or severe headaches.  You have nausea, vomiting, or drowsiness.  You have swelling around your face.  You have vision problems.  You have a stiff neck.  You have difficulty breathing. MAKE SURE YOU:   Understand these instructions.  Will watch your condition.  Will get help right away if you are not doing well or get worse. Document Released: 08/30/2005 Document Revised: 11/22/2011 Document Reviewed: 09/14/2011 [facility] Patient Information 2014 [place], [place].

## 2013-11-15 NOTE — ED Notes (Signed)
C/o  Sinus pressure and pain.  Headache.  Nonproductive cough.  Chest congestion.   Denies fever, n/v/d

## 2013-12-26 ENCOUNTER — Other Ambulatory Visit: Payer: Self-pay

## 2013-12-26 DIAGNOSIS — Z1231 Encounter for screening mammogram for malignant neoplasm of breast: Secondary | ICD-10-CM

## 2014-01-11 ENCOUNTER — Emergency Department (HOSPITAL_COMMUNITY)
Admission: EM | Admit: 2014-01-11 | Discharge: 2014-01-11 | Disposition: A | Discharge status: Home / Self Care | Payer: 59 | Attending: Emergency Medicine | Admitting: Emergency Medicine

## 2014-01-11 ENCOUNTER — Encounter (HOSPITAL_COMMUNITY): Payer: Self-pay | Admitting: Emergency Medicine

## 2014-01-11 DIAGNOSIS — Z8719 Personal history of other diseases of the digestive system: Secondary | ICD-10-CM | POA: Insufficient documentation

## 2014-01-11 DIAGNOSIS — Z9889 Other specified postprocedural states: Secondary | ICD-10-CM | POA: Insufficient documentation

## 2014-01-11 DIAGNOSIS — R42 Dizziness and giddiness: Secondary | ICD-10-CM | POA: Insufficient documentation

## 2014-01-11 DIAGNOSIS — Z79899 Other long term (current) drug therapy: Secondary | ICD-10-CM | POA: Insufficient documentation

## 2014-01-11 DIAGNOSIS — Z862 Personal history of diseases of the blood and blood-forming organs and certain disorders involving the immune mechanism: Secondary | ICD-10-CM | POA: Insufficient documentation

## 2014-01-11 DIAGNOSIS — R110 Nausea: Secondary | ICD-10-CM | POA: Insufficient documentation

## 2014-01-11 DIAGNOSIS — Z8619 Personal history of other infectious and parasitic diseases: Secondary | ICD-10-CM | POA: Insufficient documentation

## 2014-01-11 DIAGNOSIS — E876 Hypokalemia: Secondary | ICD-10-CM | POA: Insufficient documentation

## 2014-01-11 DIAGNOSIS — I059 Rheumatic mitral valve disease, unspecified: Secondary | ICD-10-CM | POA: Insufficient documentation

## 2014-01-11 DIAGNOSIS — Z8742 Personal history of other diseases of the female genital tract: Secondary | ICD-10-CM | POA: Insufficient documentation

## 2014-01-11 LAB — CBC WITH DIFFERENTIAL/PLATELET
BASOS ABS: 0 10*3/uL (ref 0.0–0.1)
BASOS PCT: 0 % (ref 0–1)
EOS ABS: 0.1 10*3/uL (ref 0.0–0.7)
Eosinophils Relative: 2 % (ref 0–5)
HEMATOCRIT: 37.9 % (ref 36.0–46.0)
Hemoglobin: 12.3 g/dL (ref 12.0–15.0)
LYMPHS ABS: 1.7 10*3/uL (ref 0.7–4.0)
Lymphocytes Relative: 27 % (ref 12–46)
MCH: 24 pg — ABNORMAL LOW (ref 26.0–34.0)
MCHC: 32.5 g/dL (ref 30.0–36.0)
MCV: 74 fL — ABNORMAL LOW (ref 78.0–100.0)
Monocytes Absolute: 0.7 10*3/uL (ref 0.1–1.0)
Monocytes Relative: 11 % (ref 3–12)
NEUTROS ABS: 3.9 10*3/uL (ref 1.7–7.7)
Neutrophils Relative %: 60 % (ref 43–77)
Platelets: 220 10*3/uL (ref 150–400)
RBC: 5.12 MIL/uL — ABNORMAL HIGH (ref 3.87–5.11)
RDW: 14.5 % (ref 11.5–15.5)
WBC: 6.4 10*3/uL (ref 4.0–10.5)

## 2014-01-11 LAB — BASIC METABOLIC PANEL
BUN: 12 mg/dL (ref 6–23)
CO2: 27 mEq/L (ref 19–32)
CREATININE: 0.77 mg/dL (ref 0.50–1.10)
Calcium: 8.7 mg/dL (ref 8.4–10.5)
Chloride: 106 mEq/L (ref 96–112)
GFR calc non Af Amer: >90 mL/min (ref >90)
GLUCOSE: 85 mg/dL (ref 70–99)
Potassium: 4.2 mEq/L (ref 3.7–5.3)
Sodium: 142 mEq/L (ref 137–147)

## 2014-01-11 MED ORDER — MECLIZINE HCL 25 MG PO TABS: 25.0000 mg | ORAL_TABLET | Freq: Four times a day (QID) | ORAL | Status: DC

## 2014-01-11 MED ORDER — DIPHENHYDRAMINE HCL 50 MG/ML IJ SOLN
25.0000 mg | Freq: Once | INTRAMUSCULAR | Status: AC
  Administered 2014-01-11: 25 mg via INTRAVENOUS
  Filled 2014-01-11: qty 1

## 2014-01-11 MED ORDER — ONDANSETRON HCL 4 MG/2ML IJ SOLN
4.0000 mg | Freq: Once | INTRAMUSCULAR | Status: AC
  Administered 2014-01-11: 4 mg via INTRAVENOUS
  Filled 2014-01-11: qty 2

## 2014-01-11 MED ORDER — SODIUM CHLORIDE 0.9 % IV SOLN
INTRAVENOUS | Status: DC
  Administered 2014-01-11: 125 mL/h via INTRAVENOUS

## 2014-01-11 MED ORDER — SODIUM CHLORIDE 0.9 % IV BOLUS (SEPSIS)
1000.0000 mL | Freq: Once | INTRAVENOUS | Status: AC
  Administered 2014-01-11: 1000 mL via INTRAVENOUS

## 2014-01-11 NOTE — ED Notes (Signed)
Received pt. from Triage ambulatory.

## 2014-01-11 NOTE — ED Notes (Signed)
Pt began to have dizziness and lightheadedness at 4:30 pm today. Pt states she was on the computer when it started. Pt describes it as "the room is spinning." Pt alert and oriented. Denies pain.

## 2014-01-11 NOTE — Discharge Instructions (Signed)

## 2014-01-11 NOTE — ED Notes (Signed)
EKG given to EDP, Steinl,J.MD. For review.

## 2014-01-11 NOTE — ED Provider Notes (Signed)
CSN: [id number]     Arrival date & time 01/11/14  1854 History   First MD Initiated Contact with Patient 01/11/14 1924     Chief Complaint  Patient presents with  . Dizziness     (Consider location/radiation/quality/duration/timing/severity/associated sxs/prior Treatment) Patient is a 53 y.o. female presenting with dizziness. The history is provided by the patient.  Dizziness  patient here with sudden onset of dizziness and lightheadedness that began at 4:30 this afternoon. Symptoms are worse with standing or movement of her head. Denies any headache. No focal neurological deficits. She has had nausea without vomiting. Denies any ear pain or tinnitus. Symptoms have been persistent. Nothing makes them better. No prior history of same. Denies any associated chest pain or dyspnea. No abdominal pain. Dosing. Syncope. Denies any recent history of black or bloody stools.  Past Medical History  Diagnosis Date  . Mitral valve prolapse   . Rapid heart beat     States "irreg heart beat"  . Hemorrhoids   . Anemia   . GERD (gastroesophageal reflux disease)   . Positive PPD   . Genital warts   . Hx of cardiac catheterization 2009    normal coronary arteries  . Hx of menorrhagia     Novasure  . Normal cardiac stress test 03/09/2012    no ischemia  . Hypokalemia   . Thyroid disease     Thyroid nodules  . Hypomagnesemia   . Hypophosphatemia    Past Surgical History  Procedure Laterality Date  . Novasure ablation    . Upper gastrointestinal endoscopy  May 2013  . Cardiac catheterization      x2 with normal results per pt   Family History  Problem Relation Age of Onset  . Heart disease Father     died Mi 31  . Hypertension Maternal Grandfather   . Diabetes Maternal Grandfather   . Heart disease Maternal Grandfather   . Colon cancer Maternal Grandfather   . Diabetes Maternal Grandmother    History  Substance Use Topics  . Smoking status: Never Smoker   . Smokeless tobacco: Never  Used  . Alcohol Use: No   OB History   Grav Para Term Preterm Abortions TAB SAB Ect Mult Living                 Review of Systems  Neurological: Positive for dizziness.  All other systems reviewed and are negative.     Allergies  Ceftriaxone sodium and Doxycycline  Home Medications   Prior to Admission medications   Medication Sig Start Date End Date Taking? Authorizing Provider  cholecalciferol (VITAMIN D) 1000 UNITS tablet Take 2,000 Units by mouth every morning.    Yes Historical Provider, MD  metoprolol tartrate (LOPRESSOR) 25 MG tablet Take 25 mg by mouth 2 (two) times daily.    Yes Historical Provider, MD  Multiple Vitamin (MULTIVITAMIN WITH MINERALS) TABS tablet Take 1 tablet by mouth daily.   Yes Historical Provider, MD  omega-3 acid ethyl esters (LOVAZA) 1 G capsule Take 1 g by mouth daily.   Yes Historical Provider, MD  PARoxetine (PAXIL-CR) 25 MG 24 hr tablet Take 25 mg by mouth every morning.    Yes Historical Provider, MD  potassium chloride SA (K-DUR,KLOR-CON) 20 MEQ tablet Take 2 tablets (40 mEq total) by mouth 2 (two) times daily. 07/19/13  Yes Burnis Medin, MD   BP 139/85  Pulse 66  Temp(Src) 98.4 F (36.9 C) (Oral)  Resp 16  SpO2 100%  Physical Exam  Nursing note and vitals reviewed. Constitutional: She is oriented to person, place, and time. She appears well-developed and well-nourished.  Non-toxic appearance. No distress.  HENT:  Head: Normocephalic and atraumatic.  Eyes: Conjunctivae, EOM and lids are normal. Pupils are equal, round, and reactive to light.  Neck: Normal range of motion. Neck supple. No tracheal deviation present. No mass present.  Cardiovascular: Normal rate, regular rhythm and normal heart sounds.  Exam reveals no gallop.   No murmur heard. Pulmonary/Chest: Effort normal and breath sounds normal. No stridor. No respiratory distress. She has no decreased breath sounds. She has no wheezes. She has no rhonchi. She has no rales.   Abdominal: Soft. Normal appearance and bowel sounds are normal. She exhibits no distension. There is no tenderness. There is no rebound and no CVA tenderness.  Musculoskeletal: Normal range of motion. She exhibits no edema and no tenderness.  Neurological: She is alert and oriented to person, place, and time. She has normal strength. No cranial nerve deficit or sensory deficit. Coordination and gait normal. GCS eye subscore is 4. GCS verbal subscore is 5. GCS motor subscore is 6.  Skin: Skin is warm and dry. No abrasion and no rash noted.  Psychiatric: She has a normal mood and affect. Her speech is normal and behavior is normal.    ED Course  Procedures (including critical care time) Labs Review Labs Reviewed  CBC WITH DIFFERENTIAL  BASIC METABOLIC PANEL    Imaging Review No results found.   EKG Interpretation None      MDM   Final diagnoses:  None   Patient given IV fluids and medications for her suspected vertigo and she feels better. She has no focal neurological findings. Do not think that this represents central vertigo likely peripheral vertigo. Stable for discharge     Leota Jacobsen, MD 01/11/14 2128

## 2014-02-01 ENCOUNTER — Ambulatory Visit
Admission: RE | Admit: 2014-02-01 | Discharge: 2014-02-01 | Disposition: A | Discharge status: Home / Self Care | Payer: 59 | Source: Ambulatory Visit

## 2014-02-01 DIAGNOSIS — Z1231 Encounter for screening mammogram for malignant neoplasm of breast: Secondary | ICD-10-CM

## 2014-02-26 ENCOUNTER — Encounter: Payer: Self-pay | Admitting: Family Medicine

## 2014-02-26 ENCOUNTER — Ambulatory Visit (INDEPENDENT_AMBULATORY_CARE_PROVIDER_SITE_OTHER): Payer: 59 | Admitting: Family Medicine

## 2014-02-26 ENCOUNTER — Telehealth: Payer: Self-pay | Admitting: Internal Medicine

## 2014-02-26 VITALS — BP 124/74 | Temp 97.8°F | Ht 67.0 in | Wt 196.0 lb

## 2014-02-26 DIAGNOSIS — E876 Hypokalemia: Secondary | ICD-10-CM

## 2014-02-26 DIAGNOSIS — E785 Hyperlipidemia, unspecified: Secondary | ICD-10-CM

## 2014-02-26 DIAGNOSIS — R002 Palpitations: Secondary | ICD-10-CM

## 2014-02-26 LAB — LIPID PANEL
CHOL/HDL RATIO: 3
Cholesterol: 127 mg/dL (ref 0–200)
HDL: 50.6 mg/dL (ref >39.00)
LDL Cholesterol: 56 mg/dL (ref 0–99)
NONHDL: 76.4
Triglycerides: 100 mg/dL (ref 0.0–149.0)
VLDL: 20 mg/dL (ref 0.0–40.0)

## 2014-02-26 LAB — BASIC METABOLIC PANEL
BUN: 11 mg/dL (ref 6–23)
CALCIUM: 9.1 mg/dL (ref 8.4–10.5)
CO2: 29 mEq/L (ref 19–32)
Chloride: 105 mEq/L (ref 96–112)
Creatinine, Ser: 0.9 mg/dL (ref 0.4–1.2)
GFR: 89.39 mL/min (ref >60.00)
Glucose, Bld: 82 mg/dL (ref 70–99)
Potassium: 4.2 mEq/L (ref 3.5–5.1)
Sodium: 137 mEq/L (ref 135–145)

## 2014-02-26 NOTE — Progress Notes (Signed)
   Subjective:    Patient ID: [name], female    DOB: July 25, 1961, 53 y.o.   MRN: [id number]  HPI Here requesting to check her potassium level because she has had some intermittent palpitations this past week. Her level has often been low in the past when she feels these sensations. No chest pain or SOB.    Review of Systems  Constitutional: Negative.   Respiratory: Negative.   Cardiovascular: Positive for palpitations. Negative for chest pain and leg swelling.  Neurological: Negative.        Objective:   Physical Exam  Constitutional: She is oriented to person, place, and time. She appears well-developed and well-nourished.  Cardiovascular: Normal rate, regular rhythm, normal heart sounds and intact distal pulses.   Pulmonary/Chest: Effort normal and breath sounds normal.  Neurological: She is alert and oriented to person, place, and time.          Assessment & Plan:  We will check a BMET today.

## 2014-02-26 NOTE — Progress Notes (Signed)
Pre visit review using our clinic review tool, if applicable. No additional management support is needed unless otherwise documented below in the visit note. 

## 2014-02-26 NOTE — Telephone Encounter (Signed)
Opened in erro/pt made appt

## 2014-04-27 ENCOUNTER — Encounter (HOSPITAL_COMMUNITY): Payer: Self-pay | Admitting: Emergency Medicine

## 2014-04-27 ENCOUNTER — Emergency Department (HOSPITAL_COMMUNITY)
Admission: EM | Admit: 2014-04-27 | Discharge: 2014-04-27 | Disposition: A | Discharge status: Home / Self Care | Payer: 59 | Source: Home / Self Care | Attending: Family Medicine | Admitting: Family Medicine

## 2014-04-27 DIAGNOSIS — S335XXA Sprain of ligaments of lumbar spine, initial encounter: Secondary | ICD-10-CM

## 2014-04-27 DIAGNOSIS — X58XXXA Exposure to other specified factors, initial encounter: Secondary | ICD-10-CM

## 2014-04-27 DIAGNOSIS — S39012A Strain of muscle, fascia and tendon of lower back, initial encounter: Secondary | ICD-10-CM

## 2014-04-27 MED ORDER — CYCLOBENZAPRINE HCL 5 MG PO TABS: 5.0000 mg | ORAL_TABLET | Freq: Three times a day (TID) | ORAL | Status: DC | PRN

## 2014-04-27 MED ORDER — DICLOFENAC POTASSIUM 50 MG PO TABS: 50.0000 mg | ORAL_TABLET | Freq: Three times a day (TID) | ORAL | Status: DC

## 2014-04-27 NOTE — ED Notes (Signed)
Pt  Reports       Low  Back     Pain  Pain  Which  Began last  Pm    denys  Any  Injury   Ambulatory  With a  Steady  Fluid  Gait   denys  Any  Urinary  Symptoms

## 2014-04-27 NOTE — ED Provider Notes (Signed)
CSN: [id number]     Arrival date & time 04/27/14  0905 History   First MD Initiated Contact with Patient 04/27/14 [phone]     Chief Complaint  Patient presents with  . Back Pain   (Consider location/radiation/quality/duration/timing/severity/associated sxs/prior Treatment) Patient is a 53 y.o. female presenting with back pain. The history is provided by the patient.  Back Pain Location:  Lumbar spine and sacro-iliac joint Quality:  Stiffness Radiates to:  Does not radiate Pain severity:  Mild Onset quality:  Gradual Duration:  12 hours Progression:  Unchanged Chronicity:  New Context: not lifting heavy objects, not occupational injury and not recent injury   Relieved by:  None tried Worsened by:  Nothing tried Ineffective treatments:  None tried Associated symptoms: no abdominal pain, no bladder incontinence, no bowel incontinence, no dysuria, no fever, no leg pain, no paresthesias, no perianal numbness, no tingling and no weakness   Risk factors: obesity     Past Medical History  Diagnosis Date  . Mitral valve prolapse   . Rapid heart beat     States "irreg heart beat"  . Hemorrhoids   . Anemia   . GERD (gastroesophageal reflux disease)   . Positive PPD   . Genital warts   . Hx of cardiac catheterization 2009    normal coronary arteries  . Hx of menorrhagia     Novasure  . Normal cardiac stress test 03/09/2012    no ischemia  . Hypokalemia   . Thyroid disease     Thyroid nodules  . Hypomagnesemia   . Hypophosphatemia    Past Surgical History  Procedure Laterality Date  . Novasure ablation    . Upper gastrointestinal endoscopy  May 2013  . Cardiac catheterization      x2 with normal results per pt   Family History  Problem Relation Age of Onset  . Heart disease Father     died Mi 85  . Hypertension Maternal Grandfather   . Diabetes Maternal Grandfather   . Heart disease Maternal Grandfather   . Colon cancer Maternal Grandfather   . Diabetes Maternal  Grandmother    History  Substance Use Topics  . Smoking status: Never Smoker   . Smokeless tobacco: Never Used  . Alcohol Use: No   OB History   Grav Para Term Preterm Abortions TAB SAB Ect Mult Living                 Review of Systems  Constitutional: Negative.  Negative for fever.  Cardiovascular: Negative.   Gastrointestinal: Negative.  Negative for abdominal pain and bowel incontinence.  Genitourinary: Negative.  Negative for bladder incontinence and dysuria.  Musculoskeletal: Positive for back pain. Negative for gait problem and myalgias.  Skin: Negative.   Neurological: Negative for tingling, weakness and paresthesias.    Allergies  Ceftriaxone sodium and Doxycycline  Home Medications   Prior to Admission medications   Medication Sig Start Date End Date Taking? Authorizing Provider  cholecalciferol (VITAMIN D) 1000 UNITS tablet Take 2,000 Units by mouth every morning.     Historical Provider, MD  cyclobenzaprine (FLEXERIL) 5 MG tablet Take 1 tablet (5 mg total) by mouth 3 (three) times daily as needed for muscle spasms. 04/27/14   Billy Fischer, MD  diclofenac (CATAFLAM) 50 MG tablet Take 1 tablet (50 mg total) by mouth 3 (three) times daily. Back pain 04/27/14   Billy Fischer, MD  metoprolol tartrate (LOPRESSOR) 25 MG tablet Take 25 mg by mouth 2 (  two) times daily.     Historical Provider, MD  Multiple Vitamin (MULTIVITAMIN WITH MINERALS) TABS tablet Take 1 tablet by mouth daily.    Historical Provider, MD  omega-3 acid ethyl esters (LOVAZA) 1 G capsule Take 1 g by mouth daily.    Historical Provider, MD  PARoxetine (PAXIL-CR) 25 MG 24 hr tablet Take 25 mg by mouth every morning.     Historical Provider, MD  potassium chloride SA (K-DUR,KLOR-CON) 20 MEQ tablet Take 2 tablets (40 mEq total) by mouth 2 (two) times daily. 07/19/13   Burnis Medin, MD   BP 108/69  Pulse 66  Temp(Src) 98.4 F (36.9 C) (Oral)  Resp 18  SpO2 99% Physical Exam  Nursing note and vitals  reviewed. Constitutional: She is oriented to person, place, and time. She appears well-developed and well-nourished.  Abdominal: Soft. Bowel sounds are normal. She exhibits no mass. There is no tenderness.  Musculoskeletal: She exhibits tenderness.       Lumbar back: She exhibits tenderness and spasm. She exhibits normal range of motion, no bony tenderness, no swelling, no edema, no pain and normal pulse.       Back:  Neurological: She is alert and oriented to person, place, and time.  Skin: Skin is warm and dry.    ED Course  Procedures (including critical care time) Labs Review Labs Reviewed - No data to display  Imaging Review No results found.   MDM   1. Strain of lumbar paraspinal muscle, initial encounter        Billy Fischer, MD 04/27/14 [phone]

## 2014-05-15 ENCOUNTER — Emergency Department (HOSPITAL_COMMUNITY)
Admission: EM | Admit: 2014-05-15 | Discharge: 2014-05-15 | Disposition: A | Discharge status: Home / Self Care | Payer: 59 | Source: Home / Self Care | Attending: Family Medicine | Admitting: Family Medicine

## 2014-05-15 ENCOUNTER — Encounter (HOSPITAL_COMMUNITY): Payer: Self-pay | Admitting: Emergency Medicine

## 2014-05-15 DIAGNOSIS — S86111A Strain of other muscle(s) and tendon(s) of posterior muscle group at lower leg level, right leg, initial encounter: Secondary | ICD-10-CM

## 2014-05-15 DIAGNOSIS — S838X9A Sprain of other specified parts of unspecified knee, initial encounter: Secondary | ICD-10-CM

## 2014-05-15 DIAGNOSIS — S86819A Strain of other muscle(s) and tendon(s) at lower leg level, unspecified leg, initial encounter: Secondary | ICD-10-CM

## 2014-05-15 NOTE — ED Provider Notes (Signed)
CSN: [id number]     Arrival date & time 05/15/14  1925 History   First MD Initiated Contact with Patient 05/15/14 2004     Chief Complaint  Patient presents with  . Knee Pain   (Consider location/radiation/quality/duration/timing/severity/associated sxs/prior Treatment) HPI  KNee pain: started today. Went walking yesterday ( 1.5 mi).Pt typically never walks. Used [employer]. No pain yesterday. Works on [employer] for [employer] 12 hrs a day. Started mid way through the day today. Getting worse. Worse w/ bending. Improves w/ rest. Has not taken anything for the pain. Denies any falls, locking, popping, change in sensation in LE.   Past Medical History  Diagnosis Date  . Mitral valve prolapse   . Rapid heart beat     States "irreg heart beat"  . Hemorrhoids   . Anemia   . GERD (gastroesophageal reflux disease)   . Positive PPD   . Genital warts   . Hx of cardiac catheterization 2009    normal coronary arteries  . Hx of menorrhagia     Novasure  . Normal cardiac stress test 03/09/2012    no ischemia  . Hypokalemia   . Thyroid disease     Thyroid nodules  . Hypomagnesemia   . Hypophosphatemia    Past Surgical History  Procedure Laterality Date  . Novasure ablation    . Upper gastrointestinal endoscopy  May 2013  . Cardiac catheterization      x2 with normal results per pt   Family History  Problem Relation Age of Onset  . Heart disease Father     died Mi 38  . Hypertension Maternal Grandfather   . Diabetes Maternal Grandfather   . Heart disease Maternal Grandfather   . Colon cancer Maternal Grandfather   . Diabetes Maternal Grandmother    History  Substance Use Topics  . Smoking status: Never Smoker   . Smokeless tobacco: Never Used  . Alcohol Use: No   OB History   Grav Para Term Preterm Abortions TAB SAB Ect Mult Living                 Review of Systems Per HPI with all other pertinent systems negative.   Allergies  Ceftriaxone sodium and  Doxycycline  Home Medications   Prior to Admission medications   Medication Sig Start Date End Date Taking? Authorizing Provider  metoprolol tartrate (LOPRESSOR) 25 MG tablet Take 25 mg by mouth 2 (two) times daily.    Yes Historical Provider, MD  Multiple Vitamin (MULTIVITAMIN WITH MINERALS) TABS tablet Take 1 tablet by mouth daily.   Yes Historical Provider, MD  PARoxetine (PAXIL-CR) 25 MG 24 hr tablet Take 25 mg by mouth every morning.    Yes Historical Provider, MD  potassium chloride SA (K-DUR,KLOR-CON) 20 MEQ tablet Take 2 tablets (40 mEq total) by mouth 2 (two) times daily. 07/19/13  Yes Burnis Medin, MD  cholecalciferol (VITAMIN D) 1000 UNITS tablet Take 2,000 Units by mouth every morning.     Historical Provider, MD  cyclobenzaprine (FLEXERIL) 5 MG tablet Take 1 tablet (5 mg total) by mouth 3 (three) times daily as needed for muscle spasms. 04/27/14   Billy Fischer, MD  diclofenac (CATAFLAM) 50 MG tablet Take 1 tablet (50 mg total) by mouth 3 (three) times daily. Back pain 04/27/14   Billy Fischer, MD  omega-3 acid ethyl esters (LOVAZA) 1 G capsule Take 1 g by mouth daily.    Historical Provider, MD   BP 120/79  Pulse 74  Temp(Src) 97.3 F (36.3 C) (Oral)  Resp 16  LMP 04/21/2014 Physical Exam  Constitutional: She is oriented to person, place, and time. She appears well-developed and well-nourished. No distress.  HENT:  Head: Normocephalic.  Eyes: EOM are normal. Pupils are equal, round, and reactive to light.  Neck: Normal range of motion.  Cardiovascular: Normal rate, normal heart sounds and intact distal pulses.   No murmur heard. Pulmonary/Chest: Effort normal. No respiratory distress.  Musculoskeletal:  Knees FROM bilat, minimal ttp along middle to lateral popliteal fossa on R. No edema. Lachmann's neg, valgus and varrus stresses w/o pain. No effusion. No cyst. Plantar flextion and dorsiflexion of the foot w/ mild discomfort.   Neurological: She is alert and oriented to  person, place, and time. She exhibits normal muscle tone.  Skin: Skin is warm and dry. No rash noted. She is not diaphoretic. No erythema.  Psychiatric: She has a normal mood and affect. Her behavior is normal. Judgment and thought content normal.    ED Course  Procedures (including critical care time) Labs Review Labs Reviewed - No data to display  Imaging Review No results found.   MDM   1. Gastrocnemius strain, right, initial encounter    Stretches given, Continue to stay active just back down on amount of exercise NSAIDs Ice Massage Precautions given and all questions answered  Linna Darner, MD Family Medicine 05/15/2014, 8:29 PM      Waldemar Dickens, MD 05/15/14 2029

## 2014-05-15 NOTE — Discharge Instructions (Signed)
You strained the back of your knee and likely the attachment of the calf muscles Please use either 1-2 Aleve twice a day or 600-800mg  ibuprofen every 6-8 hours for the next 3 days at a minimum, or until 7 days Please do your stretches Please apply ice for the next 24-48 hours for 30 min at a time Please come back if you are not better in 2-4 weeks Let pain be your guide    Popliteus Tendinitis with Rehab Tendonitis is a condition that is characterized by inflammation of a tendon. A tendon is the soft tissue that connects muscles to the skeletal system allowing for body movements. Popliteus tendonitis affects the popliteus tendon, which connects the popliteus muscle to the thigh bone (femur) near the knee. The popliteus muscle helps bend and rotate the knee. Popliteus tendonitis is often caused by a tendon tear (strain). Strains are classified into three categories. Grade 1 strains cause pain, but the tendon is not lengthened. Grade 2 strains include a lengthened ligament due to the ligament being stretched or partially ruptured. With grade 2 strains there is still function, although the function may be diminished. Grade 3 strains are characterized by a complete tear of the tendon or muscle, and function is usually impaired. SYMPTOMS   Pain in the knee, specifically the outer (lateral) and back (posterior) portions.  Pain that worsens with use of the popliteus muscle (standing on a slightly bent knee or rotating the knee).  A crackling sound (crepitation) when the tendon is moved or touched (uncommon, except when tested just after exercising). CAUSES Popliteus tendonitis occurs when damage to the popliteus tendon elicits an inflammatory (healing) response. Popliteus tendonitis is often an overuse injury. RISK INCREASES WITH:  Activities that require extensive running or walking downhill.  Poor strength and flexibility.  Failure to warm-up properly before activity.  Flat  feet. PREVENTION  Warm up and stretch properly before activity.  Allow for adequate recovery between workouts.  Maintain physical fitness:  Strength, flexibility, and endurance.  Cardiovascular fitness.  Learn and implement proper training regimens and sports technique.  Arch supports (orthotics) for individuals with flat feet. PROGNOSIS  If treated properly, then the symptoms of popliteus tendonitis usually resolve within 6 weeks.  RELATED COMPLICATIONS   Prolonged healing time, if improperly treated or re-injured.  Recurrent symptoms that result in a chronic problem. TREATMENT  Treatment initially involves the use of ice and medication to help reduce pain and inflammation. The use of strengthening and stretching exercises may help reduce pain with activity. These exercises may be performed at home or with referral to a therapist. Many individuals find that the use of a compression bandage or a knee sleeve helps reduce symptoms. If you have flat feet, then your caregiver may recommend arch supports. It is important to learn/modify techniques for running uphill/downhill that do not aggravate your symptoms. If symptoms persist for greater than 6 months despite conservative (non-surgical) treatment, then surgery may be recommended to remove the tendon sheath (lining).  MEDICATION   If pain medication is necessary, then nonsteroidal anti-inflammatory medications, such as aspirin and ibuprofen, or other minor pain relievers, such as acetaminophen, are often recommended.  Do not take pain medication within 7 days before surgery.  Prescription pain relievers may be given if deemed necessary by your caregiver. Use only as directed and only as much as you need. HEAT AND COLD  Cold treatment (icing) relieves pain and reduces inflammation. Cold treatment should be applied for 10 to 15 minutes  every 2 to 3 hours for inflammation and pain and immediately after any activity that aggravates your  symptoms. Use ice packs or massage the area with a piece of ice (ice massage).  Heat treatment may be used prior to performing the stretching and strengthening activities prescribed by your caregiver, physical therapist, or athletic trainer. Use a heat pack or soak the injury in warm water. SEEK MEDICAL CARE IF:  Treatment seems to offer no benefit, or the condition worsens.  Any medications produce adverse side effects. EXERCISES RANGE OF MOTION (ROM) AND STRETCHING EXERCISES - Popliteus Tendinitis These exercises may help you when beginning to rehabilitate your injury. Your symptoms may resolve with or without further involvement from your physician, physical therapist or athletic trainer. While completing these exercises, remember:   Restoring tissue flexibility helps normal motion to return to the joints. This allows healthier, less painful movement and activity.  An effective stretch should be held for at least 30 seconds.  A stretch should never be painful. You should only feel a gentle lengthening or release in the stretched tissue. STRETCH - Gastroc, Standing   Place hands on wall.  Extend right / left leg, keeping the front knee somewhat bent.  Slightly point your toes inward on your back foot.  Keeping your right / left heel on the floor and your knee straight, shift your weight toward the wall, not allowing your back to arch.  You should feel a gentle stretch in the right / left calf. Hold this position for __________ seconds. Repeat __________ times. Complete this stretch __________ times per day. STRETCH - Soleus, Standing   Place hands on wall.  Extend right / left leg, keeping the other knee somewhat bent.  Slightly point your toes inward on your back foot.  Keep your right / left heel on the floor, bend your back knee, and slightly shift your weight over the back leg so that you feel a gentle stretch deep in your back calf.  Hold this position for __________  seconds. Repeat __________ times. Complete this stretch __________ times per day. STRETCH - Gastrocsoleus, Standing  Note: This exercise can place a lot of stress on your foot and ankle. Please complete this exercise only if specifically instructed by your caregiver.   Place the ball of your right / left foot on a step, keeping your other foot firmly on the same step.  Hold on to the wall or a rail for balance.  Slowly lift your other foot, allowing your body weight to press your heel down over the edge of the step.  You should feel a stretch in your right / left calf.  Hold this position for __________ seconds.  Repeat this exercise with a slight bend in your right / left knee. Repeat __________ times. Complete this stretch __________ times per day.  STRETCH - Hamstrings, Standing  Stand or sit and extend your right / left leg, placing your foot on a chair or foot stool  Keeping a slight arch in your low back and your hips straight forward.  Lead with your chest and lean forward at the waist until you feel a gentle stretch in the back of your right / left knee or thigh. (When done correctly, this exercise requires leaning only a small distance.)  Hold this position for __________ seconds. Repeat __________ times. Complete this stretch __________ times per day. STRETCH - Hamstrings, Supine   Lie on your back. Loop a belt or towel over the ball of  your right / left foot.  Straighten your right / left knee and slowly pull on the belt to raise your leg. Do not allow the right / left knee to bend. Keep your opposite leg flat on the floor.  Raise the leg until you feel a gentle stretch behind your right / left knee or thigh. Hold this position for __________ seconds. Repeat __________ times. Complete this stretch __________ times per day.  STRETCH - Hamstrings, Doorway  Lie on your back with your right / left leg extended and resting on the wall and the opposite leg flat on the  ground through the door. Initially, position your bottom farther away from the wall than the illustration shows.  Keep your right / left knee straight. If you feel a stretch behind your knee or thigh, hold this position for __________ seconds.  If you do not feel a stretch, scoot your bottom closer to the door, and hold __________ seconds. Repeat __________ times. Complete this stretch __________ times per day.  STRETCH - Quadriceps, Prone   Lie on your stomach on a firm surface, such as a bed or padded floor.  Bend your right / left knee and grasp your ankle. If you are unable to reach, your ankle or pant leg, use a belt around your foot to lengthen your reach.  Gently pull your heel toward your buttocks. Your knee should not slide out to the side. You should feel a stretch in the front of your thigh and/or knee.  Hold this position for __________ seconds. Repeat __________ times. Complete this stretch __________ times per day.  STRENGTHENING EXERCISES - Popliteus Tendinitis These exercises may help you when beginning to rehabilitate your injury. They may resolve your symptoms with or without further involvement from your physician, physical therapist or athletic trainer. While completing these exercises, remember:   Muscles can gain both the endurance and the strength needed for everyday activities through controlled exercises.  Complete these exercises as instructed by your physician, physical therapist or athletic trainer. Progress the resistance and repetitions only as guided. STRENGTH - Hamstring, Isometrics   Lie on your back on a firm surface.  Bend your right / left knee approximately __________ degrees.  Dig your heel into the surface as if you are trying to pull it toward your buttocks. Tighten the muscles in the back of your thighs to "dig" as hard as you can without increasing any pain.  Hold this position for __________ seconds.  Release the tension gradually and allow  your muscle to completely relax for __________ seconds in between each exercise. Repeat __________ times. Complete this exercise __________ times per day.  STRENGTH - Hamstring, Curls   Lay on your stomach with your legs extended. (If you lay on a bed, your feet may hang over the edge.)  Tighten the muscles in the back of your thigh to bend your right / left knee up to 90 degrees. Keep your hips flat on the bed/floor.  Hold this position for __________ seconds.  Slowly lower your leg back to the starting position. Repeat __________ times. Complete this exercise __________ times per day.  OPTIONAL ANKLE WEIGHTS: Begin with ____________________, but DO NOT exceed ____________________. Increase in1 lb/0.5 kg increments. Document Released: 08/30/2005 Document Revised: 11/22/2011 Document Reviewed: 12/12/2008 [facility] Patient Information 2015 [place], [place]. This information is not intended to replace advice given to you by your health care provider. Make sure you discuss any questions you have with your health care provider.  Medial Head Gastrocnemius Tear (Tennis Leg), with Rehab Medial head gastrocnemius tear, also called tennis leg, is a tear (strain) in a muscle or tendon of the inner portion (medial head) of one of the calf muscles (gastrocnemius). The inner portion of the calf muscle attaches to the thigh bone (femur) and is responsible for bending the knee and straightening the foot (standing "on tiptoe"). Strains are classified into three categories. Grade 1 strains cause pain, but the tendon is not lengthened. Grade 2 strains include a lengthened ligament, due to the ligament being stretched or partially ruptured. With grade 2 strains there is still function, although function may be decreased. Grade 3 strains involve a complete tear of the tendon or muscle, and function is usually impaired. SYMPTOMS   Sudden "pop" or tear felt at the time of injury.  Pain, tenderness, swelling, warmth,  or redness over the middle inner calf.  Pain and weakness with ankle motion, especially flexing the ankle against resistance, as well as pain with lifting up the foot (extending the ankle).  Bruising (contusion) of the calf, heel, and sometimes the foot within 48 hours of injury.  Muscle spasm in the calf. CAUSES  Muscle and ligament strains occur when a force is placed on the muscle or ligament that is greater than it can handle. Common causes of injury include:  Direct hit (trauma) to the calf.  Sudden forceful pushing off or landing on the foot (jumping, landing, serving a tennis ball, lunging). RISK INCREASES WITH:  Sports that require sudden, explosive calf muscle contraction, such as those involving jumping (basketball), hill running, quick starts (running), or lunging (racquetball, tennis).  Contact sports (football, soccer, hockey).  Poor strength and flexibility.  Previous lower limb injury. PREVENTION  Warm up and stretch properly before activity.  Allow for adequate recovery between workouts.  Maintain physical fitness:  Strength, flexibility, and endurance.  Cardiovascular fitness.  Learn and use proper exercise technique.  Complete rehabilitation after lower limb injury, before returning to competition or practice. PROGNOSIS  If treated properly, tennis leg usually heals within 6 weeks of nonsurgical treatment.  RELATED COMPLICATIONS   Longer healing time, if not properly treated or if not given enough time to heal.  Recurring symptoms and injury, if activity is resumed too soon, with overuse, with a direct blow, or with poor technique.  If untreated, may progress to a complete tear (rare) or other injury, due to limping and favoring of the injured leg.  Persistent limping, due to scarring and shortening of the calf muscles, as a result of inadequate rehabilitation.  Prolonged disability. TREATMENT  Treatment first involves the use of ice and medication  to help reduce pain and inflammation. The use of strengthening and stretching exercises may help reduce pain with activity. These exercises may be performed at home or with a therapist. For severe injuries, referral to a therapist may be needed for further evaluation and treatment. Your caregiver may advise that you wear a brace to help healing. Sometimes, crutches are needed until you can walk without limping. Rarely, surgery is needed.  MEDICATION   If pain medicine is needed, nonsteroidal anti-inflammatory medicines (aspirin and ibuprofen), or other minor pain relievers (acetaminophen), are often advised.  Do not take pain medicine for 7 days before surgery.  Prescription pain relievers may be given, if your caregiver thinks they are needed. Use only as directed and only as much as you need. HEAT AND COLD  Cold treatment (icing) should be applied for 10 to  15 minutes every 2 to 3 hours for inflammation and pain, and immediately after activity that aggravates your symptoms. Use ice packs or an ice massage.  Heat treatment may be used before performing stretching and strengthening activities prescribed by your caregiver, physical therapist, or athletic trainer. Use a heat pack or a warm water soak. SEEK MEDICAL CARE IF:   Symptoms get worse or do not improve in 2 weeks, despite treatment.  Numbness or tingling develops.  New, unexplained symptoms develop. (Drugs used in treatment may produce side effects.) EXERCISES  RANGE OF MOTION (ROM) AND STRETCHING EXERCISES - Medial Head Gastrocnemius Tear (Tennis Leg) These exercises may help you when beginning to rehabilitate your injury. Your symptoms may resolve with or without further involvement from your physician, physical therapist, or athletic trainer. While completing these exercises, remember:   Restoring tissue flexibility helps normal motion to return to the joints. This allows healthier, less painful movement and activity.  An  effective stretch should be held for at least 30 seconds.  A stretch should never be painful. You should only feel a gentle lengthening or release in the stretched tissue. STRETCH - Gastrocsoleus  Sit with your right / left leg extended. Holding onto both ends of a belt or towel, loop it around the ball of your foot.  Keeping your right / left ankle and foot relaxed and your knee straight, pull your foot and ankle toward you using the belt.  You should feel a gentle stretch behind your calf or knee. Hold this position for __________ seconds. Repeat __________ times. Complete this stretch __________ times per day.  RANGE OF MOTION - Ankle Dorsiflexion, Active Assisted   Remove your shoes and sit on a chair, preferably not on a carpeted surface.  Place your right / left foot directly under the knee. Extend your opposite leg for support.  Keeping your heel down, slide your right / left foot back toward the chair, until you feel a stretch at your ankle or calf. If you do not feel a stretch, slide your bottom forward to the edge of the chair, while still keeping your heel down.  Hold this stretch for __________ seconds. Repeat __________ times. Complete this stretch __________ times per day.  STRETCH - Gastroc, Standing   Place your hands on a wall.  Extend your right / left leg behind you, keeping the front knee somewhat bent.  Slightly point your toes inward on your back foot.  Keeping your right / left heel on the floor and your knee straight, shift your weight toward the wall, not allowing your back to arch.  You should feel a gentle stretch in the right / left calf. Hold this position for __________ seconds. Repeat __________ times. Complete this stretch __________ times per day. STRETCH - Soleus, Standing   Place your hands on a wall.  Extend your right / left leg behind you, keeping the other knee somewhat bent.  Point your toes of your back foot slightly inward.  Keep your  right / left heel on the floor, bend your back knee, and slightly shift your weight over the back leg so that you feel a gentle stretch deep in your back calf.  Hold this position for __________ seconds. Repeat __________ times. Complete this stretch __________ times per day. STRETCH - Gastrocsoleus, Standing Note: This exercise can place a lot of stress on your foot and ankle. Please complete this exercise only if specifically instructed by your caregiver.   Place the ball  of your right / left foot on a step, keeping your other foot firmly on the same step.  Hold on to the wall or a rail for balance.  Slowly lift your other foot, allowing your body weight to press your heel down over the edge of the step.  You should feel a stretch in your right / left calf.  Hold this position for __________ seconds.  Repeat this exercise with a slight bend in your right / left knee. Repeat __________ times. Complete this stretch __________ times per day.  STRENGTHENING EXERCISES - Medial Head Gastrocnemius Tear (Tennis Leg) These exercises may help you when beginning to rehabilitate your injury. They may resolve your symptoms with or without further involvement from your physician, physical therapist, or athletic trainer. While completing these exercises, remember:   Muscles can gain both the endurance and the strength needed for everyday activities through controlled exercises.  Complete these exercises as instructed by your physician, physical therapist, or athletic trainer. Increase the resistance and repetitions only as guided by your caregiver. STRENGTH - Plantar-flexors  Sit with your right / left leg extended. Holding onto both ends of a rubber exercise band or tubing, loop it around the ball of your foot. Keep a slight tension in the band.  Slowly push your toes away from you, pointing them downward.  Hold this position for __________ seconds. Return slowly, controlling the tension in the  band. Repeat __________ times. Complete this exercise __________ times per day.  STRENGTH - Plantar-flexors  Stand with your feet shoulder width apart. Steady yourself with a wall or table, using as little support as needed.  Keeping your weight evenly spread over the width of your feet, rise up on your toes.*  Hold this position for __________ seconds. Repeat __________ times. Complete this exercise __________ times per day.  *If this is too easy, shift your weight toward your right / left leg until you feel challenged. Ultimately, you may be asked to do this exercise while standing on your right / left foot only. STRENGTH - Plantar-flexors, Eccentric Note: This exercise can place a lot of stress on your foot and ankle. Please complete this exercise only if specifically instructed by your caregiver.   Place the balls of your feet on a step. With your hands, use only enough support from a wall or rail to keep your balance.  Keep your knees straight and rise up on your toes.  Slowly shift your weight entirely to your right / left toes and pick up your opposite foot. Gently and with controlled movement, lower your weight through your right / left foot so that your heel drops below the level of the step. You will feel a slight stretch in the back of your right / left calf.  Use the healthy leg to help rise up onto the balls of both feet, then lower weight only onto the right / left leg again. Build up to 15 repetitions. Then progress to 3 sets of 15 repetitions.*  After completing the above exercise, complete the same exercise with a slight knee bend (about 30 degrees). Again, build up to 15 repetitions. Then progress to 3 sets of 15 repetitions.* Perform this exercise __________ times per day.  *When you easily complete 3 sets of 15, your physician, physical therapist, or athletic trainer may advise you to add resistance, by wearing a backpack filled with additional weight. Document Released:  08/30/2005 Document Revised: 01/14/2014 Document Reviewed: 12/12/2008 [facility] Patient Information 2015 [place], [place].  This information is not intended to replace advice given to you by your health care provider. Make sure you discuss any questions you have with your health care provider. ° °

## 2014-05-15 NOTE — ED Notes (Signed)
C/o pain on right popliteal onset 1100 today Recalls walking 1.5 miles yest; denies inj/trauma Reports swelling; hurts to bend knee Ambulated w/steady gait; no signs of acute distress.

## 2014-06-10 ENCOUNTER — Telehealth: Payer: Self-pay | Admitting: Internal Medicine

## 2014-06-10 ENCOUNTER — Encounter: Payer: Self-pay | Admitting: Family Medicine

## 2014-06-10 ENCOUNTER — Ambulatory Visit (INDEPENDENT_AMBULATORY_CARE_PROVIDER_SITE_OTHER): Payer: 59 | Admitting: Family Medicine

## 2014-06-10 VITALS — BP 100/72 | HR 72 | Temp 98.9°F | Ht 67.0 in | Wt 199.5 lb

## 2014-06-10 DIAGNOSIS — E876 Hypokalemia: Secondary | ICD-10-CM

## 2014-06-10 DIAGNOSIS — M545 Low back pain, unspecified: Secondary | ICD-10-CM

## 2014-06-10 LAB — BASIC METABOLIC PANEL
BUN: 9 mg/dL (ref 6–23)
CALCIUM: 8.7 mg/dL (ref 8.4–10.5)
CHLORIDE: 103 meq/L (ref 96–112)
CO2: 28 mEq/L (ref 19–32)
CREATININE: 0.8 mg/dL (ref 0.4–1.2)
GFR: 91.79 mL/min (ref >60.00)
Glucose, Bld: 74 mg/dL (ref 70–99)
Potassium: 3.7 mEq/L (ref 3.5–5.1)
Sodium: 137 mEq/L (ref 135–145)

## 2014-06-10 NOTE — Telephone Encounter (Signed)
Patient seen by Dr. Maudie Mercury on 06/10/14.

## 2014-06-10 NOTE — Telephone Encounter (Signed)
Pt would like to have xray for back pain. Pt wants to know if she should be seen first. Transferred to triage.

## 2014-06-10 NOTE — Telephone Encounter (Signed)
Patient Information:  Caller Name: Ander Purpura  Phone: [phone]  Patient: Danielle, Hanson  Gender: Female  DOB: [birth year]  Age: 53 Years  PCP: [facility])  Pregnant: No  Office Follow Up:  Does the office need to follow up with this patient?: No  Instructions For The Office: N/A  RN Note:  LMP: Menopausal. Patient states she has had lower back discomfort X 1 year. States pain has become progressively worse. Patient states pain does not radiate. Patient states pain is worse in the mornings and improves during the day. Denies injury. Patient denies urinary sx. Afebrile. Care advice given per guidelines. Patient advised may take Ibuprofen 600mg , with food, q 6 hours as needed for pain. Call back parameters reviewed. Patient verbalizes understanding. No appts. available with Dr. Regis Bill. Appt. scheudled for 1100 (due to travel time) 06/10/14 with Dr. Maudie Mercury.  Symptoms  Reason For Call & Symptoms: Back Pain  Reviewed Health History In EMR: Yes  Reviewed Medications In EMR: Yes  Reviewed Allergies In EMR: Yes  Reviewed Surgeries / Procedures: Yes  Date of Onset of Symptoms: Unknown  Treatments Tried: Ibuprofen  Treatments Tried Worked: No OB / GYN:  LMP: Unknown  Guideline(s) Used:  Back Pain  Disposition Per Guideline:   See Today or Tomorrow in Office  Reason For Disposition Reached:   Patient wants to be seen  Advice Given:  Pain Medicines:  For pain relief, take acetaminophen, ibuprofen, or naproxen.  Call Back If:  Numbness or weakness occur  Bowel/bladder problems occur  You become worse.  Patient Will Follow Care Advice:  YES  Appointment Scheduled:  06/10/2014 11:00:00 Appointment Scheduled Provider:  Maudie Mercury (TEXT 1st, after 20 mins can call), Jarrett S[place] [facility])

## 2014-06-10 NOTE — Progress Notes (Signed)
Pre visit review using our clinic review tool, if applicable. No additional management support is needed unless otherwise documented below in the visit note. 

## 2014-06-10 NOTE — Progress Notes (Signed)
No chief complaint on file.   HPI:  Acute visit for:  1) Back Pain: -chronic for several years, worsening -on roc plain films in 2012 for low back pain showed mod lumbar DDD -she reports has bulging disc in neck -reports: low back pain for a long time, worse with walking on hard floors in "bad shoes", for the last few months has some stiffness in the mornings and then feels better once moves, pain is intermittent achy pain, pain is 0-7/10  -has not tried anything for this -denies: fevers, malaise, hx of trauma, weakness, numbness, radiation  2) Hypokalemia: -takes potassium for this, wants to check as reports she usually checks this every few months  ROS: See pertinent positives and negatives per HPI.  Past Medical History  Diagnosis Date  . Mitral valve prolapse   . Rapid heart beat     States "irreg heart beat"  . Hemorrhoids   . Anemia   . GERD (gastroesophageal reflux disease)   . Positive PPD   . Genital warts   . Hx of cardiac catheterization 2009    normal coronary arteries  . Hx of menorrhagia     Novasure  . Normal cardiac stress test 03/09/2012    no ischemia  . Hypokalemia   . Thyroid disease     Thyroid nodules  . Hypomagnesemia   . Hypophosphatemia     Past Surgical History  Procedure Laterality Date  . Novasure ablation    . Upper gastrointestinal endoscopy  May 2013  . Cardiac catheterization      x2 with normal results per pt    Family History  Problem Relation Age of Onset  . Heart disease Father     died Mi 82  . Hypertension Maternal Grandfather   . Diabetes Maternal Grandfather   . Heart disease Maternal Grandfather   . Colon cancer Maternal Grandfather   . Diabetes Maternal Grandmother     History   Social History  . Marital Status: Divorced    Spouse Name: N/A    Number of Children: 2  . Years of Education: 16   Occupational History  . dialysis tech    Social History Main Topics  . Smoking status: Never Smoker   .  Smokeless tobacco: Never Used  . Alcohol Use: No  . Drug Use: No  . Sexual Activity: Yes   Other Topics Concern  . None   Social History Narrative   Ms. 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 She lived in [place] and in [place] for a number of years her mom is from [place]. 16 years of education went to college at [employer] lives at home with her son who is in his 1s no pets   Neg ets tob etoh hx PA   6 hours of sleep   G2P2    TD2010  colonoscopy 2009             Current outpatient prescriptions:cholecalciferol (VITAMIN D) 1000 UNITS tablet, Take 2,000 Units by mouth every morning. , Disp: , Rfl: ;  cyclobenzaprine (FLEXERIL) 5 MG tablet, Take 1 tablet (5 mg total) by mouth 3 (three) times daily as needed for muscle spasms., Disp: 30 tablet, Rfl: 0;  diclofenac (CATAFLAM) 50 MG tablet, Take 1 tablet (50 mg total) by mouth 3 (three) times daily. Back pain, Disp: 30 tablet, Rfl: 0 metoprolol tartrate (LOPRESSOR)  25 MG tablet, Take 25 mg by mouth 2 (two) times daily. , Disp: , Rfl: ;  Multiple Vitamin (MULTIVITAMIN WITH MINERALS) TABS tablet, Take 1 tablet by mouth daily., Disp: , Rfl: ;  PARoxetine (PAXIL-CR) 25 MG 24 hr tablet, Take 25 mg by mouth every morning. , Disp: , Rfl: ;  potassium chloride SA (K-DUR,KLOR-CON) 20 MEQ tablet, Take 2 tablets (40 mEq total) by mouth 2 (two) times daily., Disp: 120 tablet, Rfl: 5  EXAM:  Filed Vitals:   06/10/14 1115  BP: 100/72  Pulse: 72  Temp: 98.9 F (37.2 C)    Body mass index is 31.24 kg/(m^2).  GENERAL: vitals reviewed and listed above, alert, oriented, appears well hydrated and in no acute distress  HEENT: atraumatic, conjunttiva clear, no obvious abnormalities on inspection of external nose and ears  NECK: no obvious masses on inspection  MS: moves all extremities without noticeable abnormality  Normal  Gait Normal inspection of back, no obvious scoliosis or leg length descrepancy No bony TTP Soft tissue TTP at: bilat lower lumbar paraspinal muscles -/+ tests: neg trendelenburg,+ Rfacet loading, -SLRT, -CLRT, -FABER, -FADIR Normal muscle strength, sensation to light touch and DTRs in LEs bilaterally  PSYCH: pleasant and cooperative, no obvious depression or anxiety  ASSESSMENT AND PLAN:  Discussed the following assessment and plan:  Low back pain without sciatica, unspecified back pain laterality  Hypokalemia - Plan: Basic metabolic panel  -we discussed possible serious and likely etiologies, workup and treatment, treatment risks and return precautions -after this discussion, Janette opted for HEP, conservative tx for likely DDD, facet arthropathy -follow up advised in 1 month with PCP - may need imaging, referral to specialist if persists -of course, we advised Theresa  to return or notify a doctor immediately if symptoms worsen or persist or new concerns arise.  -Patient advised to return or notify a doctor immediately if symptoms worsen or persist or new concerns arise.  Patient Instructions  -do the exercises provided at least 4 days per week  -start getting gentle cardiovascular exercise on a regular basis   -can use tylenol 500-1000mg  up to 3 times per day - not more  -can use topical sports creams if these help  -follow up in 1 month with Dr. Marda Stalker, [place].

## 2014-06-10 NOTE — Patient Instructions (Signed)
-  do the exercises provided at least 4 days per week  -start getting gentle cardiovascular exercise on a regular basis   -can use tylenol 500-1000mg  up to 3 times per day - not more  -can use topical sports creams if these help  -follow up in 1 month with Dr. Regis Bill

## 2014-06-10 NOTE — Telephone Encounter (Signed)
Noted  

## 2014-06-25 ENCOUNTER — Telehealth: Payer: Self-pay | Admitting: Internal Medicine

## 2014-06-25 NOTE — Telephone Encounter (Signed)
Labs ordered by Dr. Maudie Mercury

## 2014-06-25 NOTE — Telephone Encounter (Signed)
Pt would like blood work results °

## 2014-06-26 NOTE — Telephone Encounter (Signed)
normal

## 2014-06-26 NOTE — Telephone Encounter (Signed)
I left a message at the pts cell number to return my call. 

## 2014-07-04 ENCOUNTER — Telehealth: Payer: Self-pay | Admitting: Internal Medicine

## 2014-07-04 NOTE — Telephone Encounter (Signed)
I spoke with the pt and informed her the lab tests from 9/28 was normal and I informed her of the potassium level as well.

## 2014-07-04 NOTE — Telephone Encounter (Signed)
Pt called about results 

## 2014-08-28 ENCOUNTER — Emergency Department (HOSPITAL_COMMUNITY)
Admission: EM | Admit: 2014-08-28 | Discharge: 2014-08-28 | Disposition: A | Discharge status: Home / Self Care | Payer: 59 | Attending: Emergency Medicine | Admitting: Emergency Medicine

## 2014-08-28 ENCOUNTER — Encounter (HOSPITAL_COMMUNITY): Payer: Self-pay | Admitting: Emergency Medicine

## 2014-08-28 DIAGNOSIS — R197 Diarrhea, unspecified: Secondary | ICD-10-CM | POA: Diagnosis present

## 2014-08-28 DIAGNOSIS — Z8679 Personal history of other diseases of the circulatory system: Secondary | ICD-10-CM | POA: Insufficient documentation

## 2014-08-28 DIAGNOSIS — Z9889 Other specified postprocedural states: Secondary | ICD-10-CM | POA: Diagnosis not present

## 2014-08-28 DIAGNOSIS — R140 Abdominal distension (gaseous): Secondary | ICD-10-CM | POA: Insufficient documentation

## 2014-08-28 DIAGNOSIS — Z8742 Personal history of other diseases of the female genital tract: Secondary | ICD-10-CM | POA: Insufficient documentation

## 2014-08-28 DIAGNOSIS — E876 Hypokalemia: Secondary | ICD-10-CM | POA: Diagnosis not present

## 2014-08-28 DIAGNOSIS — Z862 Personal history of diseases of the blood and blood-forming organs and certain disorders involving the immune mechanism: Secondary | ICD-10-CM | POA: Diagnosis not present

## 2014-08-28 DIAGNOSIS — Z8719 Personal history of other diseases of the digestive system: Secondary | ICD-10-CM | POA: Insufficient documentation

## 2014-08-28 DIAGNOSIS — Z791 Long term (current) use of non-steroidal anti-inflammatories (NSAID): Secondary | ICD-10-CM | POA: Diagnosis not present

## 2014-08-28 DIAGNOSIS — Z79899 Other long term (current) drug therapy: Secondary | ICD-10-CM | POA: Diagnosis not present

## 2014-08-28 DIAGNOSIS — Z8619 Personal history of other infectious and parasitic diseases: Secondary | ICD-10-CM | POA: Insufficient documentation

## 2014-08-28 DIAGNOSIS — R110 Nausea: Secondary | ICD-10-CM | POA: Insufficient documentation

## 2014-08-28 LAB — URINALYSIS, ROUTINE W REFLEX MICROSCOPIC
Bilirubin Urine: NEGATIVE
Glucose, UA: NEGATIVE mg/dL
Hgb urine dipstick: NEGATIVE
KETONES UR: NEGATIVE mg/dL
LEUKOCYTES UA: NEGATIVE
NITRITE: NEGATIVE
Protein, ur: NEGATIVE mg/dL
Specific Gravity, Urine: 1.005 (ref 1.005–1.030)
UROBILINOGEN UA: 0.2 mg/dL (ref 0.0–1.0)
pH: 7.5 (ref 5.0–8.0)

## 2014-08-28 LAB — CBC WITH DIFFERENTIAL/PLATELET
BASOS ABS: 0 10*3/uL (ref 0.0–0.1)
Basophils Relative: 0 % (ref 0–1)
EOS PCT: 2 % (ref 0–5)
Eosinophils Absolute: 0.1 10*3/uL (ref 0.0–0.7)
HEMATOCRIT: 39.1 % (ref 36.0–46.0)
Hemoglobin: 12.4 g/dL (ref 12.0–15.0)
Lymphocytes Relative: 21 % (ref 12–46)
Lymphs Abs: 1.3 10*3/uL (ref 0.7–4.0)
MCH: 24 pg — ABNORMAL LOW (ref 26.0–34.0)
MCHC: 31.7 g/dL (ref 30.0–36.0)
MCV: 75.8 fL — AB (ref 78.0–100.0)
MONO ABS: 0.9 10*3/uL (ref 0.1–1.0)
Monocytes Relative: 15 % — ABNORMAL HIGH (ref 3–12)
Neutro Abs: 3.7 10*3/uL (ref 1.7–7.7)
Neutrophils Relative %: 62 % (ref 43–77)
Platelets: 195 10*3/uL (ref 150–400)
RBC: 5.16 MIL/uL — ABNORMAL HIGH (ref 3.87–5.11)
RDW: 14 % (ref 11.5–15.5)
WBC: 5.9 10*3/uL (ref 4.0–10.5)

## 2014-08-28 LAB — COMPREHENSIVE METABOLIC PANEL
ALT: 17 U/L (ref 0–35)
ANION GAP: 10 (ref 5–15)
AST: 21 U/L (ref 0–37)
Albumin: 3.4 g/dL — ABNORMAL LOW (ref 3.5–5.2)
Alkaline Phosphatase: 62 U/L (ref 39–117)
BUN: 7 mg/dL (ref 6–23)
CALCIUM: 8.2 mg/dL — AB (ref 8.4–10.5)
CO2: 24 meq/L (ref 19–32)
CREATININE: 0.69 mg/dL (ref 0.50–1.10)
Chloride: 104 mEq/L (ref 96–112)
GFR calc Af Amer: >90 mL/min (ref >90)
GFR calc non Af Amer: >90 mL/min (ref >90)
GLUCOSE: 100 mg/dL — AB (ref 70–99)
Potassium: 3.8 mEq/L (ref 3.7–5.3)
Sodium: 138 mEq/L (ref 137–147)
Total Bilirubin: 0.3 mg/dL (ref 0.3–1.2)
Total Protein: 6.5 g/dL (ref 6.0–8.3)

## 2014-08-28 LAB — LIPASE, BLOOD: LIPASE: 25 U/L (ref 11–59)

## 2014-08-28 MED ORDER — LOPERAMIDE HCL 2 MG PO CAPS: 2.0000 mg | ORAL_CAPSULE | Freq: Four times a day (QID) | ORAL | Status: DC | PRN

## 2014-08-28 MED ORDER — SODIUM CHLORIDE 0.9 % IV SOLN: 1000.0000 mL | INTRAVENOUS | Status: DC

## 2014-08-28 MED ORDER — ONDANSETRON HCL 4 MG/2ML IJ SOLN
4.0000 mg | Freq: Once | INTRAMUSCULAR | Status: AC
  Administered 2014-08-28: 4 mg via INTRAVENOUS
  Filled 2014-08-28: qty 2

## 2014-08-28 MED ORDER — SODIUM CHLORIDE 0.9 % IV SOLN
1000.0000 mL | Freq: Once | INTRAVENOUS | Status: AC
  Administered 2014-08-28: 1000 mL via INTRAVENOUS

## 2014-08-28 MED ORDER — ONDANSETRON 8 MG PO TBDP: 8.0000 mg | ORAL_TABLET | Freq: Three times a day (TID) | ORAL | Status: DC | PRN

## 2014-08-28 NOTE — ED Provider Notes (Signed)
CSN: [id number]     Arrival date & time 08/28/14  0019 History   First MD Initiated Contact with Patient 08/28/14 0103     Chief Complaint  Patient presents with  . Diarrhea  . Bloated  . Nausea     Patient is a 53 y.o. female presenting with diarrhea. The history is provided by the patient.  Diarrhea Quality:  Watery Severity:  Moderate Number of episodes:  15 episodes last 24 hours Duration:  1 day Timing:  Constant Relieved by:  Nothing Ineffective treatments:  Anti-motility medications Associated symptoms: no recent cough, no fever and no vomiting   Associated symptoms comment:  Abdominal bloating  Risk factors: no recent antibiotic use, no sick contacts, no suspicious food intake and no travel to endemic areas     Past Medical History  Diagnosis Date  . Mitral valve prolapse   . Rapid heart beat     States "irreg heart beat"  . Hemorrhoids   . Anemia   . GERD (gastroesophageal reflux disease)   . Positive PPD   . Genital warts   . Hx of cardiac catheterization 2009    normal coronary arteries  . Hx of menorrhagia     Novasure  . Normal cardiac stress test 03/09/2012    no ischemia  . Hypokalemia   . Thyroid disease     Thyroid nodules  . Hypomagnesemia   . Hypophosphatemia    Past Surgical History  Procedure Laterality Date  . Novasure ablation    . Upper gastrointestinal endoscopy  May 2013  . Cardiac catheterization      x2 with normal results per pt   Family History  Problem Relation Age of Onset  . Heart disease Father     died Mi 62  . Hypertension Maternal Grandfather   . Diabetes Maternal Grandfather   . Heart disease Maternal Grandfather   . Colon cancer Maternal Grandfather   . Diabetes Maternal Grandmother    History  Substance Use Topics  . Smoking status: Never Smoker   . Smokeless tobacco: Never Used  . Alcohol Use: No   OB History    No data available     Review of Systems  Constitutional: Negative for fever.   Gastrointestinal: Positive for diarrhea. Negative for vomiting.  All other systems reviewed and are negative.     Allergies  Ceftriaxone sodium and Doxycycline  Home Medications   Prior to Admission medications   Medication Sig Start Date End Date Taking? Authorizing Provider  cholecalciferol (VITAMIN D) 1000 UNITS tablet Take 2,000 Units by mouth every morning.     Historical Provider, MD  cyclobenzaprine (FLEXERIL) 5 MG tablet Take 1 tablet (5 mg total) by mouth 3 (three) times daily as needed for muscle spasms. 04/27/14   Billy Fischer, MD  diclofenac (CATAFLAM) 50 MG tablet Take 1 tablet (50 mg total) by mouth 3 (three) times daily. Back pain 04/27/14   Billy Fischer, MD  loperamide (IMODIUM) 2 MG capsule Take 1 capsule (2 mg total) by mouth 4 (four) times daily as needed for diarrhea or loose stools. 08/28/14   Dorie Rank, MD  metoprolol tartrate (LOPRESSOR) 25 MG tablet Take 25 mg by mouth 2 (two) times daily.     Historical Provider, MD  Multiple Vitamin (MULTIVITAMIN WITH MINERALS) TABS tablet Take 1 tablet by mouth daily.    Historical Provider, MD  ondansetron (ZOFRAN ODT) 8 MG disintegrating tablet Take 1 tablet (8 mg total) by mouth every  8 (eight) hours as needed for nausea or vomiting. 08/28/14   Dorie Rank, MD  PARoxetine (PAXIL-CR) 25 MG 24 hr tablet Take 25 mg by mouth every morning.     Historical Provider, MD  potassium chloride SA (K-DUR,KLOR-CON) 20 MEQ tablet Take 2 tablets (40 mEq total) by mouth 2 (two) times daily. 07/19/13   Burnis Medin, MD   BP 119/86 mmHg  Pulse 57  Temp(Src) 98.1 F (36.7 C) (Oral)  Resp 16  SpO2 97%  LMP 04/28/2014 Physical Exam  Constitutional: She appears well-developed and well-nourished. No distress.  HENT:  Head: Normocephalic and atraumatic.  Right Ear: External ear normal.  Left Ear: External ear normal.  Eyes: Conjunctivae are normal. Right eye exhibits no discharge. Left eye exhibits no discharge. No scleral icterus.  Neck:  Neck supple. No tracheal deviation present.  Cardiovascular: Normal rate, regular rhythm and intact distal pulses.   Pulmonary/Chest: Effort normal and breath sounds normal. No stridor. No respiratory distress. She has no wheezes. She has no rales.  Abdominal: Soft. Bowel sounds are normal. She exhibits no distension. There is no tenderness. There is no rebound and no guarding.  Musculoskeletal: She exhibits no edema or tenderness.  Neurological: She is alert. She has normal strength. No cranial nerve deficit (no facial droop, extraocular movements intact, no slurred speech) or sensory deficit. She exhibits normal muscle tone. She displays no seizure activity. Coordination normal.  Skin: Skin is warm and dry. No rash noted.  Psychiatric: She has a normal mood and affect.  Nursing note and vitals reviewed.   ED Course  Procedures (including critical care time) Labs Review Labs Reviewed  CBC WITH DIFFERENTIAL - Abnormal; Notable for the following:    RBC 5.16 (*)    MCV 75.8 (*)    MCH 24.0 (*)    Monocytes Relative 15 (*)    All other components within normal limits  COMPREHENSIVE METABOLIC PANEL - Abnormal; Notable for the following:    Glucose, Bld 100 (*)    Calcium 8.2 (*)    Albumin 3.4 (*)    All other components within normal limits  LIPASE, BLOOD  URINALYSIS, ROUTINE W REFLEX MICROSCOPIC   Medications  0.9 %  sodium chloride infusion (1,000 mLs Intravenous New Bag/Given 08/28/14 0126)    Followed by  0.9 %  sodium chloride infusion (1,000 mLs Intravenous New Bag/Given 08/28/14 0126)    Followed by  0.9 %  sodium chloride infusion (not administered)  ondansetron (ZOFRAN) injection 4 mg (4 mg Intravenous Given 08/28/14 0126)     MDM   Final diagnoses:  Diarrhea   Patient does not have any abdominal tenderness on exam. Her laboratory tests are reassuring. I doubt colitis or diverticulitis.  Is possible her symptoms are related to a viral gastroenteritis.  The plan is  to discharge home. Zofran and Imodium as needed. Return for worsening symptoms. Follow up with her doctor this week if symptoms do not resolve in the next 24 -48 hrs. At this time there does not appear to be any evidence of an acute emergency medical condition and the patient appears stable for discharge with appropriate outpatient follow up.     Dorie Rank, MD 08/28/14 [phone]

## 2014-08-28 NOTE — ED Notes (Addendum)
Pt from home c/o diarrhea and abdominal distention and bloating since yesterday. Taking imodium which relieved diarrhea. She reports having 15 episodes of diarrhea within the last 24 hrs. HX of several electrolyte deficiencies.

## 2014-08-28 NOTE — Discharge Instructions (Signed)

## 2014-09-02 ENCOUNTER — Telehealth: Payer: Self-pay

## 2014-09-02 ENCOUNTER — Other Ambulatory Visit: Payer: Self-pay | Admitting: Internal Medicine

## 2014-09-02 NOTE — Telephone Encounter (Signed)
Noted.  Pt to call back if needed.

## 2014-09-02 NOTE — Telephone Encounter (Signed)
[facility] Night - Client [place] [facility] Patient Name: [name] Gender: Female DOB: 1961/07/05 Age: 53 Y 2 M 26 D Return Phone Number: [phone] (Primary) Address: [address] City/State/Zip: [place] [address] Client [facility] Night - Client Client Site [facility] - Night Physician Shanon Ace Contact Type Call Call Type Triage / Clinical Relationship To Patient Self Return Phone Number [phone] (Primary) Chief Complaint Diarrhea Initial Comment Caller states that she is having reoccurring diarrhea. PreDisposition Did not know what to do Nurse Assessment Nurse: Shawn Stall, RN, Trish Date/Time Eilene Ghazi Time): 08/31/2014 8:57:59 PM Confirm and document reason for call. If symptomatic, describe symptoms. ---Patient is calling for self and caller states that she is having reoccurring diarrhea. Was sick Tuesday and Wednesday and then went away. Patient started eating and then it came back. No temp. Was feeling clammy today. Patient is drinking plenty of fluids. Today had 5 liquid stools today. Has the patient traveled out of the country within the last 30 days? ---No Does the patient require triage? ---Yes Related visit to physician within the last 2 weeks? ---Yes Does the PT have any chronic conditions? (i.e. diabetes, asthma, etc.) ---Yes List chronic conditions. ---Martin Majestic to Er on the 16th and was checked and was fine. No chronic condition Guidelines Guideline Title Affirmed Question Affirmed Notes Nurse Date/Time Eilene Ghazi Time) Diarrhea Mild diarrhea (all triage questions negative) Hamilton, RN, Trish 08/31/2014 9:04:12 PM Disp. Time Eilene Ghazi Time) Disposition Final User 08/31/2014 9:12:23 PM Home Care Yes Shawn Stall, RN, Trish Caller Understands: Yes Disagree/Comply: Comply PLEASE NOTE: All timestamps contained within this report are represented  as [place] Standard Time. CONFIDENTIALTY NOTICE: This fax transmission is intended only for the addressee. It contains information that is legally privileged, confidential or otherwise protected from use or disclosure. If you are not the intended recipient, you are strictly prohibited from reviewing, disclosing, copying using or disseminating any of this information or taking any action in reliance on or regarding this information. If you have received this fax in error, please notify us immediately by telephone so that we can arrange for its return to [place]. Phone: [phone], Toll-Free: [phone], Fax: [phone] Page: 2 of 2 Call Id: [id number] Care Advice Given Per Guideline * Diarrhea is the body's way of getting rid of the germs. * In healthy adults, most new onset diarrhea is caused by a viral infection of the intestines. REASSURANCE: HOME CARE: You should be able to treat this at home. * Here are some tips on how to keep ahead of the fluid losses. CAUTION - Loperamide (Imodium AD): EXPECTED COURSE: Viral diarrhea lasts 4-7 days. Always worse on days 1 and 2. * Signs of dehydration occur (e.g., no urine over 12 hours, very dry mouth, lightheaded, etc.) CALL BACK IF: * Diarrhea persists over 7 days * You become worse. CARE ADVICE given per Diarrhea (Adult) guideline. * Drink more fluids, at least 8-10 glasses (8 oz) daily. FLUID THERAPY DURING MILD-MODERATE DIARRHEA: * For example: sports drinks, diluted fruit juices, soft drinks. * Supplement this with saltine crackers or soups, to make certain that you are getting sufficient fluid and salt to meet your body's needs. * Maintaining some food intake during episodes of diarrhea is important. NUTRITION DURING MILD-MODERATE DIARRHEA * Ideal initial foods include boiled starches / cereals (e.g., potatoes, rice, noodles, wheat, oats) with a small amount of salt to taste. * Other acceptable foods include: bananas, yogurt, crackers, soup. * As  your  stools return to normal consistency, resume a normal diet. * Helps reduce diarrhea. * Be certain to wash your hands after using the restroom. CONTAGIOUSNESS: After Care Instructions Given Call Event Type User Date / Time Description

## 2014-09-03 NOTE — Telephone Encounter (Signed)
Not sure when pt should return 

## 2014-09-04 ENCOUNTER — Telehealth: Payer: Self-pay

## 2014-09-04 ENCOUNTER — Other Ambulatory Visit: Payer: Self-pay | Admitting: *Deleted

## 2014-09-04 MED ORDER — POTASSIUM CHLORIDE CRYS ER 20 MEQ PO TBCR: 40.0000 meq | EXTENDED_RELEASE_TABLET | Freq: Two times a day (BID) | ORAL | Status: DC

## 2014-09-04 NOTE — Telephone Encounter (Signed)
Called [facility] on [address] and pt's prescription is ready for pick up.  Called and spoke with pt and pt is aware.

## 2014-09-04 NOTE — Telephone Encounter (Signed)
[facility] Night - Client TELEPHONE [facility] Patient Name: [name] Gender: Female DOB: Dec 26, 1960 Age: 53 Y 2 M 28 D Return Phone Number: [phone] (Primary) Address: [address] City/State/Zip: [place] [address] Client [facility] Night - Client Client Site [facility] - Night Physician Shanon Ace Contact Type Call Call Type Triage / Clinical Relationship To Patient Self Return Phone Number [phone] (Primary) Chief Complaint Prescription Refill or Medication Request (non symptomatic) Initial Comment Caller states rx was not called in yesterday. potassium Nurse Assessment Nurse: Arnoldo Morale, RN, Shelton Silvas Date/Time (Eastern Time): 09/03/2014 5:48:39 PM Please select the assessment type ---RX called in but not at pharm Additional Documentation ---Caller states rx was not called in yesterday. potassium. Pharmacy sent a request for a refill but it was not called in. Pt states her potassium is always low and she needs it everyday. She did receive a call from the office but missed it. Document the name of the medication. ---Potassium Pharmacy name and phone number. [phone] Has the office closed within the last 30 minutes? ---No Does the client directives allow for assistance with medications after hours? ---Yes Is there an on-call physician for the client? ---Yes Guidelines Guideline Title Affirmed Question Affirmed Notes Nurse Date/Time ([place] Time) Disp. Time Eilene Ghazi Time) Disposition Final User 09/03/2014 5:59:35 PM Pharmacy Call Arnoldo Morale, RN, Bridget [facility] [phone] Spoke to [facility] 09/03/2014 5:59:47 PM Clinical Call Yes Arnoldo Morale, RN, Shelton Silvas After Care Instructions Given Call Event Type User Date / Time Description  Noted.  Rx sent to pharmacy.

## 2014-09-10 ENCOUNTER — Encounter: Payer: 59 | Admitting: Internal Medicine

## 2014-09-10 NOTE — Progress Notes (Signed)
Document opened and reviewed for OV but appt  canceled same day .  

## 2014-09-18 ENCOUNTER — Encounter: Payer: 59 | Admitting: Internal Medicine

## 2014-09-18 NOTE — Progress Notes (Signed)
Document opened and reviewed for OV but appt  canceled same day .  

## 2014-09-20 ENCOUNTER — Encounter: Payer: Self-pay | Admitting: Family Medicine

## 2014-09-20 ENCOUNTER — Ambulatory Visit (INDEPENDENT_AMBULATORY_CARE_PROVIDER_SITE_OTHER): Payer: 59 | Admitting: Family Medicine

## 2014-09-20 ENCOUNTER — Telehealth: Payer: Self-pay | Admitting: Internal Medicine

## 2014-09-20 VITALS — BP 100/78 | HR 82 | Temp 98.2°F | Ht 67.0 in | Wt 198.8 lb

## 2014-09-20 DIAGNOSIS — R197 Diarrhea, unspecified: Secondary | ICD-10-CM

## 2014-09-20 DIAGNOSIS — Z0000 Encounter for general adult medical examination without abnormal findings: Secondary | ICD-10-CM

## 2014-09-20 DIAGNOSIS — E876 Hypokalemia: Secondary | ICD-10-CM

## 2014-09-20 MED ORDER — POTASSIUM CHLORIDE CRYS ER 20 MEQ PO TBCR: 40.0000 meq | EXTENDED_RELEASE_TABLET | Freq: Two times a day (BID) | ORAL | Status: DC

## 2014-09-20 NOTE — Telephone Encounter (Signed)
Pt would like to go to the  [facility] office for her cpx labs .She will be going the week of 12/30/14

## 2014-09-20 NOTE — Progress Notes (Signed)
Pre visit review using our clinic review tool, if applicable. No additional management support is needed unless otherwise documented below in the visit note. 

## 2014-09-20 NOTE — Patient Instructions (Signed)
Please schedule follow up with Dr. Regis Bill as you leave in the next month.  Please get future refills from your primary doctor.  Please call at least 24 hours prior to any cancellations.

## 2014-09-20 NOTE — Progress Notes (Signed)
HPI:  [name] is 54 yo F patient of [name] here for med refills. On review of chart she late canceled her last 2 appointments with [name] - one several days ago. Now seeing me today for refill of medication.  Hypokalemia: -reports taking this for 3 years -reports had extensive eval with a nephrologist and has been on potassium since -reports [name] knows if she stops it potassium drops -reports feels well, no palpitaitons or DOE -reports does have chronic loose and greenish colored stools for > 1 year and will discuss with PCP Note: discussed with [name] - she advised no more then 30  for refill and that pt schedule follow up with her.    ROS: See pertinent positives and negatives per HPI.  Past Medical History  Diagnosis Date  . Mitral valve prolapse   . Rapid heart beat     States "irreg heart beat"  . Hemorrhoids   . Anemia   . GERD (gastroesophageal reflux disease)   . Positive PPD   . Genital warts   . Hx of cardiac catheterization 2009    normal coronary arteries  . Hx of menorrhagia     Novasure  . Normal cardiac stress test 03/09/2012    no ischemia  . Hypokalemia   . Thyroid disease     Thyroid nodules  . Hypomagnesemia   . Hypophosphatemia     Past Surgical History  Procedure Laterality Date  . Novasure ablation    . Upper gastrointestinal endoscopy  May 2013  . Cardiac catheterization      x2 with normal results per pt    Family History  Problem Relation Age of Onset  . Heart disease Father     died Mi 18  . Hypertension Maternal Grandfather   . Diabetes Maternal Grandfather   . Heart disease Maternal Grandfather   . Colon cancer Maternal Grandfather   . Diabetes Maternal Grandmother     History   Social History  . Marital Status: Divorced    Spouse Name: N/A    Number of Children: 2  . Years of Education: 16   Occupational History  . dialysis tech    Social History Main Topics  . Smoking status: Never Smoker    . Smokeless tobacco: Never Used  . Alcohol Use: No  . Drug Use: No  . Sexual Activity: Yes   Other Topics Concern  . 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 She lived in [place] and in [place] for a number of years her mom is from [place]. 16 years of education went to college at [employer] lives at home with her son who is in his 88s no pets   Neg ets tob etoh hx PA   6 hours of sleep   G2P2    TD2010  colonoscopy 2009             Current outpatient prescriptions: CALCIUM PO, Take by mouth daily., Disp: , Rfl: ;  cholecalciferol (VITAMIN D) 1000 UNITS tablet, Take 2,000 Units by mouth every morning. , Disp: , Rfl: ;  metoprolol tartrate (LOPRESSOR) 25 MG tablet, Take 25 mg by mouth 2 (two) times daily. , Disp: , Rfl: ;  PARoxetine (PAXIL-CR) 25 MG 24 hr tablet, Take 25 mg by mouth every morning. , Disp: ,  Rfl:  potassium chloride SA (K-DUR,KLOR-CON) 20 MEQ tablet, Take 2 tablets (40 mEq total) by mouth 2 (two) times daily., Disp: 30 tablet, Rfl: 0  EXAM:  Filed Vitals:   09/20/14 1014  BP: 100/78  Pulse: 82  Temp: 98.2 F (36.8 C)    Body mass index is 31.13 kg/(m^2).  GENERAL: vitals reviewed and listed above, alert, oriented, appears well hydrated and in no acute distress  HEENT: atraumatic, conjunttiva clear, no obvious abnormalities on inspection of external nose and ears  NECK: no obvious masses on inspection  LUNGS: clear to auscultation bilaterally, no wheezes, rales or rhonchi, good air movement  CV: HRRR, no peripheral edema  MS: moves all extremities without noticeable abnormality  PSYCH: pleasant and cooperative, no obvious depression or anxiety  ASSESSMENT AND PLAN:  Discussed the following assessment and plan:  Hypokalemia - Plan: potassium chloride SA (K-DUR,KLOR-CON) 20 MEQ tablet  -advised  follow up with [name] in the next 1 month per PCP recs and for chronic loose stools - advised no further refills unless through PCP. Advised has had 2 late cancels and must call at least 24 hours prior to future appts for cancelling. -Patient advised to return or notify a doctor immediately if symptoms worsen or persist or new concerns arise.  Patient Instructions  Please schedule follow up with [name] as you leave in the next month.  Please get future refills from your primary doctor.  Please call at least 24 hours prior to any cancellations.       Colin Benton R.

## 2014-09-21 ENCOUNTER — Emergency Department (HOSPITAL_COMMUNITY)
Admission: EM | Admit: 2014-09-21 | Discharge: 2014-09-21 | Disposition: A | Discharge status: Home / Self Care | Payer: 59 | Attending: Emergency Medicine | Admitting: Emergency Medicine

## 2014-09-21 ENCOUNTER — Encounter: Payer: Self-pay | Admitting: Internal Medicine

## 2014-09-21 ENCOUNTER — Encounter (HOSPITAL_COMMUNITY): Payer: Self-pay | Admitting: Emergency Medicine

## 2014-09-21 DIAGNOSIS — Z8619 Personal history of other infectious and parasitic diseases: Secondary | ICD-10-CM | POA: Diagnosis not present

## 2014-09-21 DIAGNOSIS — Z8639 Personal history of other endocrine, nutritional and metabolic disease: Secondary | ICD-10-CM | POA: Insufficient documentation

## 2014-09-21 DIAGNOSIS — Z862 Personal history of diseases of the blood and blood-forming organs and certain disorders involving the immune mechanism: Secondary | ICD-10-CM | POA: Diagnosis not present

## 2014-09-21 DIAGNOSIS — Z79899 Other long term (current) drug therapy: Secondary | ICD-10-CM | POA: Diagnosis not present

## 2014-09-21 DIAGNOSIS — Z8679 Personal history of other diseases of the circulatory system: Secondary | ICD-10-CM | POA: Insufficient documentation

## 2014-09-21 DIAGNOSIS — R109 Unspecified abdominal pain: Secondary | ICD-10-CM | POA: Diagnosis present

## 2014-09-21 DIAGNOSIS — Z9889 Other specified postprocedural states: Secondary | ICD-10-CM | POA: Diagnosis not present

## 2014-09-21 DIAGNOSIS — Z8742 Personal history of other diseases of the female genital tract: Secondary | ICD-10-CM | POA: Insufficient documentation

## 2014-09-21 DIAGNOSIS — R197 Diarrhea, unspecified: Secondary | ICD-10-CM | POA: Insufficient documentation

## 2014-09-21 DIAGNOSIS — R110 Nausea: Secondary | ICD-10-CM | POA: Diagnosis not present

## 2014-09-21 DIAGNOSIS — K219 Gastro-esophageal reflux disease without esophagitis: Secondary | ICD-10-CM | POA: Insufficient documentation

## 2014-09-21 LAB — CBC WITH DIFFERENTIAL/PLATELET
BASOS ABS: 0 10*3/uL (ref 0.0–0.1)
Basophils Relative: 0 % (ref 0–1)
Eosinophils Absolute: 0.1 10*3/uL (ref 0.0–0.7)
Eosinophils Relative: 1 % (ref 0–5)
HEMATOCRIT: 39.5 % (ref 36.0–46.0)
HEMOGLOBIN: 12.6 g/dL (ref 12.0–15.0)
Lymphocytes Relative: 24 % (ref 12–46)
Lymphs Abs: 1.3 10*3/uL (ref 0.7–4.0)
MCH: 24 pg — ABNORMAL LOW (ref 26.0–34.0)
MCHC: 31.9 g/dL (ref 30.0–36.0)
MCV: 75.2 fL — AB (ref 78.0–100.0)
Monocytes Absolute: 0.9 10*3/uL (ref 0.1–1.0)
Monocytes Relative: 16 % — ABNORMAL HIGH (ref 3–12)
NEUTROS PCT: 59 % (ref 43–77)
Neutro Abs: 3.3 10*3/uL (ref 1.7–7.7)
PLATELETS: 188 10*3/uL (ref 150–400)
RBC: 5.25 MIL/uL — ABNORMAL HIGH (ref 3.87–5.11)
RDW: 13.8 % (ref 11.5–15.5)
WBC: 5.5 10*3/uL (ref 4.0–10.5)

## 2014-09-21 LAB — COMPREHENSIVE METABOLIC PANEL
ALT: 19 U/L (ref 0–35)
AST: 25 U/L (ref 0–37)
Albumin: 3.5 g/dL (ref 3.5–5.2)
Alkaline Phosphatase: 60 U/L (ref 39–117)
Anion gap: 6 (ref 5–15)
BILIRUBIN TOTAL: 0.4 mg/dL (ref 0.3–1.2)
BUN: 11 mg/dL (ref 6–23)
CO2: 25 mmol/L (ref 19–32)
CREATININE: 0.78 mg/dL (ref 0.50–1.10)
Calcium: 8.7 mg/dL (ref 8.4–10.5)
Chloride: 109 mEq/L (ref 96–112)
GFR calc Af Amer: >90 mL/min (ref >90)
GLUCOSE: 97 mg/dL (ref 70–99)
POTASSIUM: 4.3 mmol/L (ref 3.5–5.1)
SODIUM: 140 mmol/L (ref 135–145)
Total Protein: 6.3 g/dL (ref 6.0–8.3)

## 2014-09-21 LAB — URINALYSIS, ROUTINE W REFLEX MICROSCOPIC
BILIRUBIN URINE: NEGATIVE
Glucose, UA: NEGATIVE mg/dL
Hgb urine dipstick: NEGATIVE
Ketones, ur: NEGATIVE mg/dL
Nitrite: NEGATIVE
PH: 5 (ref 5.0–8.0)
Protein, ur: NEGATIVE mg/dL
Specific Gravity, Urine: 1.009 (ref 1.005–1.030)
Urobilinogen, UA: 0.2 mg/dL (ref 0.0–1.0)

## 2014-09-21 LAB — URINE MICROSCOPIC-ADD ON

## 2014-09-21 MED ORDER — SODIUM CHLORIDE 0.9 % IV BOLUS (SEPSIS)
1000.0000 mL | Freq: Once | INTRAVENOUS | Status: AC
Start: 2014-09-21 — End: 2014-09-21
  Administered 2014-09-21: 1000 mL via INTRAVENOUS

## 2014-09-21 MED ORDER — METRONIDAZOLE 500 MG PO TABS: 500.0000 mg | ORAL_TABLET | Freq: Two times a day (BID) | ORAL | Status: DC

## 2014-09-21 MED ORDER — ONDANSETRON 8 MG PO TBDP: 8.0000 mg | ORAL_TABLET | Freq: Three times a day (TID) | ORAL | Status: DC | PRN

## 2014-09-21 MED ORDER — ONDANSETRON HCL 4 MG/2ML IJ SOLN
4.0000 mg | Freq: Once | INTRAMUSCULAR | Status: AC
  Administered 2014-09-21: 4 mg via INTRAVENOUS
  Filled 2014-09-21: qty 2

## 2014-09-21 MED ORDER — METRONIDAZOLE 500 MG PO TABS
500.0000 mg | ORAL_TABLET | Freq: Once | ORAL | Status: AC
  Administered 2014-09-21: 500 mg via ORAL
  Filled 2014-09-21: qty 1

## 2014-09-21 NOTE — ED Provider Notes (Addendum)
CSN: [id number]     Arrival date & time 09/21/14  0236 History   None    Chief Complaint  Patient presents with  . Abdominal Pain      HPI Patient presents to the emergency department complaining of nausea and a small amount of abdominal bloating.  She's also had some green diarrhea.  She states that for the past 7-10 days she has had really foul-smelling loose watery diarrhea.  No vomiting just nausea.  Mild decreased oral intake.  Mild lightheadedness when she stands up.  No blood in her stool.  No history of recent antibiotics or recent hospitalization.  She does however work in the dialysis center.  Patient had an episode several weeks ago as well seemed to resolve on its own but now her symptoms returned.  No focal abdominal pain just upper abdominal cramping.    Past Medical History  Diagnosis Date  . Mitral valve prolapse   . Rapid heart beat     States "irreg heart beat"  . Hemorrhoids   . Anemia   . GERD (gastroesophageal reflux disease)   . Positive PPD   . Genital warts   . Hx of cardiac catheterization 2009    normal coronary arteries  . Hx of menorrhagia     Novasure  . Normal cardiac stress test 03/09/2012    no ischemia  . Hypokalemia   . Thyroid disease     Thyroid nodules  . Hypomagnesemia   . Hypophosphatemia    Past Surgical History  Procedure Laterality Date  . Novasure ablation    . Upper gastrointestinal endoscopy  May 2013  . Cardiac catheterization      x2 with normal results per pt   Family History  Problem Relation Age of Onset  . Heart disease Father     died Mi 25  . Hypertension Maternal Grandfather   . Diabetes Maternal Grandfather   . Heart disease Maternal Grandfather   . Colon cancer Maternal Grandfather   . Diabetes Maternal Grandmother    History  Substance Use Topics  . Smoking status: Never Smoker   . Smokeless tobacco: Never Used  . Alcohol Use: No   OB History    No data available     Review of Systems  All other  systems reviewed and are negative.     Allergies  Ceftriaxone sodium and Doxycycline  Home Medications   Prior to Admission medications   Medication Sig Start Date End Date Taking? Authorizing Provider  CALCIUM PO Take by mouth daily.    Historical Provider, MD  cholecalciferol (VITAMIN D) 1000 UNITS tablet Take 2,000 Units by mouth every morning.     Historical Provider, MD  metoprolol tartrate (LOPRESSOR) 25 MG tablet Take 25 mg by mouth 2 (two) times daily.     Historical Provider, MD  PARoxetine (PAXIL-CR) 25 MG 24 hr tablet Take 25 mg by mouth every morning.     Historical Provider, MD  potassium chloride SA (K-DUR,KLOR-CON) 20 MEQ tablet Take 2 tablets (40 mEq total) by mouth 2 (two) times daily. 09/20/14   Lucretia Kern, DO   BP 128/80 mmHg  Pulse 107  Temp(Src) 97.7 F (36.5 C) (Oral)  Resp 18  SpO2 100%  LMP 08/17/2014 Physical Exam  Constitutional: She is oriented to person, place, and time. She appears well-developed and well-nourished. No distress.  HENT:  Head: Normocephalic and atraumatic.  Eyes: EOM are normal.  Neck: Normal range of motion.  Cardiovascular: Normal  rate, regular rhythm and normal heart sounds.   Pulmonary/Chest: Effort normal and breath sounds normal.  Abdominal: Soft. She exhibits no distension. There is no tenderness.  Musculoskeletal: Normal range of motion.  Neurological: She is alert and oriented to person, place, and time.  Skin: Skin is warm and dry.  Psychiatric: She has a normal mood and affect. Judgment normal.  Nursing note and vitals reviewed.   ED Course  Procedures (including critical care time) Labs Review Labs Reviewed  CBC WITH DIFFERENTIAL - Abnormal; Notable for the following:    RBC 5.25 (*)    MCV 75.2 (*)    MCH 24.0 (*)    Monocytes Relative 16 (*)    All other components within normal limits  URINALYSIS, ROUTINE W REFLEX MICROSCOPIC - Abnormal; Notable for the following:    APPearance CLOUDY (*)    Leukocytes,  UA SMALL (*)    All other components within normal limits  CLOSTRIDIUM DIFFICILE BY PCR  COMPREHENSIVE METABOLIC PANEL  URINE MICROSCOPIC-ADD ON    Imaging Review No results found.   EKG Interpretation None      MDM   Final diagnoses:  Diarrhea  Nausea    Given her foul-smelling diarrhea and her crampy abdominal pain with associated chills this morning I suspect this is C. difficile colitis given her workplace is a dialysis center.  I ordered a C. difficile PCR in the emergency department but she was unable to give me a specimen.  I've ordered an outpatient test to be completed at [facility].  In the meantime she will be started on Flagyl.  Primary care follow-up.  She understands return to the ER for new or worsening symptoms    Hoy Morn, MD 09/21/14 [place], MD 09/21/14 [phone]

## 2014-09-21 NOTE — ED Notes (Signed)
Pt c/o abd bloating, green diarrhea and malaise x 1.5 weeks, worse this morning, +nausea.

## 2014-09-21 NOTE — Progress Notes (Signed)
  Patient has canceled 2 appts same day recently  Then came  in for work in appt other provider for med check  Jan 8th    Now seen in ED Jan 9th   For diarrhea.  hopefully  she will keep her next appt with me to help delineate why she is having diarrhea and low K .   I have not seen her personally since october 2014  She now has had 4  Ed visits in 6 months  Not sent by triage .   Patient Demographics     Patient Name Sex DOB SSN Address    [name], Wires Female [birth year] [id number] [place] [address]    Phone: [phone] [facility]) *Preferred* [phone] (Work) [phone] (Mobile)       Message  Received: 3 days ago    Wilmer Floor, MD    Cc: Vaughan Browner             Pt cancel her 2pm appt .She said she could not make it

## 2014-09-26 ENCOUNTER — Ambulatory Visit (INDEPENDENT_AMBULATORY_CARE_PROVIDER_SITE_OTHER): Payer: 59 | Admitting: Internal Medicine

## 2014-09-26 ENCOUNTER — Other Ambulatory Visit: Payer: Self-pay | Admitting: Internal Medicine

## 2014-09-26 ENCOUNTER — Encounter: Payer: Self-pay | Admitting: Internal Medicine

## 2014-09-26 VITALS — BP 110/84 | HR 68 | Temp 98.7°F | Wt 197.0 lb

## 2014-09-26 DIAGNOSIS — E876 Hypokalemia: Secondary | ICD-10-CM

## 2014-09-26 DIAGNOSIS — R197 Diarrhea, unspecified: Secondary | ICD-10-CM

## 2014-09-26 DIAGNOSIS — R195 Other fecal abnormalities: Secondary | ICD-10-CM

## 2014-09-26 MED ORDER — POTASSIUM CHLORIDE CRYS ER 20 MEQ PO TBCR: 20.0000 meq | EXTENDED_RELEASE_TABLET | Freq: Two times a day (BID) | ORAL | Status: DC

## 2014-09-26 NOTE — Assessment & Plan Note (Signed)
Uncertain if had hyper aldo eval but stable and  Will continue to  Monitor and cont pot supp at this  Time.

## 2014-09-26 NOTE — Progress Notes (Signed)
Pre visit review using our clinic review tool, if applicable. No additional management support is needed unless otherwise documented below in the visit note. 

## 2014-09-26 NOTE — Patient Instructions (Signed)
Stool tests and will let you know results   Consider seeing Gi specialist if continuing.  consider getting abd ultrasound to check gallbladder and pancrease if continuing Can try probiotic in the interim.  Take daily for 2-4 weeks such as align If needed we can try specialappts times to work you in our schedule but please call day ahead if need to cancel.

## 2014-09-26 NOTE — Progress Notes (Signed)
Chief Complaint  Patient presents with  . Follow-up    HPI: [name] 54 y.o. last seen by me a year ago  Has been in ed x 4  in past 6 months ...  Diarrhea 12 16 and calf strain 9 15 and back pain 8 15 and vertigo 5 1 viral syndrome 2 26 various problems and saw dr Maudie Mercury in October for LBP    Then ed visit day after being seen for med check per dr Maudie Mercury cause couldn't make appt with me the day before  And had diarrhea .off an on  Has a difficult work schedule . Hard to get in reliably.  Given flagyl incase c diff  But no stool collected but brought one in today.  No recent wellness visit but on schedule She is on paxil no change  LOW potassium for unknown reasons had hx of hypokalemia saw renal special sit at some time and was told unknown reason  could be genetic  ( gm had same problem)   Decided against referral to tertiary center cause may not make a difference in her treatment and prognosis .  States  gi sx post dated the potassium issue . Over past month had gi illness and then intermittent cramps and green stools loose not water    No blood not triggered by  A given good  Has been told she is lactose intolerant and had ibs but this is different  th...  ROS: See pertinent positives and negatives per HPI. No fever travel pets  Other with same hx  In past sweats upper body better.. Still upper abd ? 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 Mitral valve prolapse   . Rapid heart beat     States "irreg heart beat"  . Hemorrhoids   . Anemia   . GERD (gastroesophageal reflux disease)   . Positive PPD   . Genital warts   . Hx of cardiac catheterization 2009    normal coronary arteries  . Hx of menorrhagia     Novasure  . Normal cardiac stress test 03/09/2012    no ischemia  . Hypokalemia   . Thyroid  disease     Thyroid nodules  . Hypomagnesemia   . Hypophosphatemia     Family History  Problem Relation Age of Onset  . Heart disease Father     died Mi 79  . Hypertension Maternal Grandfather   . Diabetes Maternal Grandfather   . Heart disease Maternal Grandfather   . Colon cancer Maternal Grandfather   . Diabetes Maternal Grandmother     History   Social History  . Marital Status: Divorced    Spouse Name: N/A    Number of Children: 2  . Years of Education: 16   Occupational History  . dialysis tech    Social History Main Topics  . Smoking status: Never Smoker   . Smokeless tobacco: Never Used  . Alcohol Use: No  . Drug Use: No  . Sexual Activity: Yes   Other Topics Concern  . None   Social History Narrative   Ms. 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  She lived in [place] and in [place] for a number of years her mom is from [place]. 16 years of education went to college at [employer] lives at home with her son who is in his 46s no pets   Neg ets tob etoh hx PA   6 hours of sleep   G2P2    TD2010  colonoscopy 2009   Considering own business           Outpatient Encounter Prescriptions as of 09/26/2014  Medication Sig  . CALCIUM PO Take by mouth daily.  . cholecalciferol (VITAMIN D) 1000 UNITS tablet Take 2,000 Units by mouth every morning.   . metoprolol tartrate (LOPRESSOR) 25 MG tablet Take 25 mg by mouth 2 (two) times daily.   . metroNIDAZOLE (FLAGYL) 500 MG tablet Take 1 tablet (500 mg total) by mouth 2 (two) times daily.  [name] PARoxetine (PAXIL-CR) 25 MG 24 hr tablet Take 25 mg by mouth every morning.   . potassium chloride SA (K-DUR,KLOR-CON) 20 MEQ tablet Take 1 tablet (20 mEq total) by mouth 2 (two) times daily.  . [DISCONTINUED] potassium chloride SA (K-DUR,KLOR-CON) 20 MEQ tablet Take 2 tablets (40 mEq total) by mouth 2 (two) times  daily.  . ondansetron (ZOFRAN ODT) 8 MG disintegrating tablet Take 1 tablet (8 mg total) by mouth every 8 (eight) hours as needed for nausea or vomiting. (Patient not taking: Reported on 09/26/2014)    EXAM:  BP 110/84 mmHg  Pulse 68  Temp(Src) 98.7 F (37.1 C) (Oral)  Wt 197 lb (89.359 kg)  LMP 08/17/2014  Body mass index is 30.85 kg/(m^2).  GENERAL: vitals reviewed and listed above, alert, oriented, appears well hydrated and in no acute distress HEENT: atraumatic, conjunctiva  clear, no obvious abnormalities on inspection of external nose and ears OP : no lesion edema or exudate  NECK: no obvious masses on inspection palpation  LUNGS: clear to auscultation bilaterally, no wheezes, rales or rhonchi, good air movement CV: HRRR, no clubbing cyanosis or  peripheral edema nl cap refill  Abdomen:  Sof,t normal bowel sounds without hepatosplenomegaly, no guarding rebound or masses no CVA tendernessslightly tender llq noand rlq no g or r  MS: moves all extremities without noticeable focal  abnormality PSYCH: pleasant and cooperative, no obvious depression or anxiety Lab Results  Component Value Date   WBC 5.5 09/21/2014   HGB 12.6 09/21/2014   HCT 39.5 09/21/2014   PLT 188 09/21/2014   GLUCOSE 97 09/21/2014   CHOL 127 02/26/2014   TRIG 100.0 02/26/2014   HDL 50.60 02/26/2014   LDLCALC 56 02/26/2014   ALT 19 09/21/2014   AST 25 09/21/2014   NA 140 09/21/2014   K 4.3 09/21/2014   CL 109 09/21/2014   CREATININE 0.78 09/21/2014   BUN 11 09/21/2014   CO2 25 09/21/2014   TSH 1.048 07/01/2013   INR 0.95 03/10/2012   HGBA1C 6.0 09/29/2012   Wt Readings from Last 3 Encounters:  09/26/14 197 lb (89.359 kg)  09/20/14 198 lb 12.8 oz (90.175 kg)  06/10/14 199 lb 8 oz (90.493 kg)    ASSESSMENT AND PLAN:  Discussed the following assessment and plan:  Abnormal stools - Plan: Clostridium Difficile by PCR, Stool culture, Fecal lactoferrin, Giardia/cryptosporidium (EIA), Fecal fat,  qualitative  Diarrhea - Plan: Clostridium Difficile by PCR, Stool culture, Fecal lactoferrin, Giardia/cryptosporidium (EIA), Fecal fat, qualitative  Hypokalemia - Plan: potassium chloride SA (K-DUR,KLOR-CON) 20 MEQ tablet Uncertain cause ? Post infectious IBS some sounds  like  malabsorption is already on flagyl check stool today consider other eval if needed keep appt in April  -Patient advised to return or notify health care team  if symptoms worsen ,persist or new concerns arise.  Patient Instructions  Stool tests and will let you know results   Consider seeing Gi specialist if continuing.  consider getting abd ultrasound to check gallbladder and pancrease if continuing Can try probiotic in the interim.  Take daily for 2-4 weeks such as align If needed we can try specialappts times to work you in our schedule but please call day ahead if need to cancel.    Standley Brooking. Augustine Leverette M.D.

## 2014-09-27 ENCOUNTER — Other Ambulatory Visit: Payer: Self-pay | Admitting: Internal Medicine

## 2014-09-27 NOTE — Telephone Encounter (Signed)
Please order cpx labs and lipase and , magnesium. Celiac 10 panel  Dx   Preventive and  Low potassium and diarrhea as dx

## 2014-09-27 NOTE — Telephone Encounter (Signed)
Which labs would you like done and I will order them?

## 2014-09-30 NOTE — Addendum Note (Signed)
Addended by: Westley Hummer B on: 09/30/2014 12:41 PM   Modules accepted: Orders

## 2014-09-30 NOTE — Telephone Encounter (Signed)
Lab orders placed.  

## 2014-10-01 ENCOUNTER — Other Ambulatory Visit: Payer: 59

## 2014-10-03 LAB — FECAL LACTOFERRIN, QUANT: Lactoferrin: NEGATIVE

## 2014-10-03 LAB — GIARDIA/CRYPTOSPORIDIUM (EIA)
Cryptosporidium Screen (EIA): NEGATIVE
Giardia Screen (EIA): NEGATIVE

## 2014-10-03 LAB — CLOSTRIDIUM DIFFICILE BY PCR: CDIFFPCR: NOT DETECTED

## 2014-10-06 LAB — STOOL CULTURE

## 2014-10-08 ENCOUNTER — Telehealth: Payer: Self-pay | Admitting: Internal Medicine

## 2014-10-08 NOTE — Telephone Encounter (Signed)
Pt needs blood work results °

## 2014-10-08 NOTE — Telephone Encounter (Signed)
See  Result note  stool tests  Negative results . Idont see the blood test once  Did she get them yet?

## 2014-10-09 NOTE — Telephone Encounter (Signed)
Pt did not have blood work just stool cards

## 2014-10-09 NOTE — Telephone Encounter (Signed)
Pt notified of normal labs.  She would like to know what is the next step in treatment.  She thought you mentioned an ultrasound.  Please advise.  Thanks!

## 2014-10-10 LAB — FECAL FAT, QUALITATIVE: Fecal Fat Qualitative: NORMAL

## 2014-10-10 NOTE — Telephone Encounter (Signed)
Go ahead and get the blood work ( know that it is earlier than planned)  . THen Abdominal ultrasound

## 2014-10-11 ENCOUNTER — Other Ambulatory Visit (INDEPENDENT_AMBULATORY_CARE_PROVIDER_SITE_OTHER): Payer: 59

## 2014-10-11 DIAGNOSIS — R197 Diarrhea, unspecified: Secondary | ICD-10-CM

## 2014-10-11 DIAGNOSIS — E876 Hypokalemia: Secondary | ICD-10-CM

## 2014-10-11 DIAGNOSIS — Z0000 Encounter for general adult medical examination without abnormal findings: Secondary | ICD-10-CM

## 2014-10-11 LAB — CBC WITH DIFFERENTIAL/PLATELET
Basophils Absolute: 0 10*3/uL (ref 0.0–0.1)
Basophils Relative: 0.6 % (ref 0.0–3.0)
Eosinophils Absolute: 0.1 10*3/uL (ref 0.0–0.7)
Eosinophils Relative: 1.5 % (ref 0.0–5.0)
HCT: 40.2 % (ref 36.0–46.0)
Hemoglobin: 12.9 g/dL (ref 12.0–15.0)
Lymphocytes Relative: 27.8 % (ref 12.0–46.0)
Lymphs Abs: 1.7 10*3/uL (ref 0.7–4.0)
MCHC: 32 g/dL (ref 30.0–36.0)
MCV: 75.1 fl — ABNORMAL LOW (ref 78.0–100.0)
Monocytes Absolute: 0.6 10*3/uL (ref 0.1–1.0)
Monocytes Relative: 10.3 % (ref 3.0–12.0)
Neutro Abs: 3.6 10*3/uL (ref 1.4–7.7)
Neutrophils Relative %: 59.8 % (ref 43.0–77.0)
Platelets: 239 10*3/uL (ref 150.0–400.0)
RBC: 5.36 Mil/uL — ABNORMAL HIGH (ref 3.87–5.11)
RDW: 14.1 % (ref 11.5–15.5)
WBC: 6 10*3/uL (ref 4.0–10.5)

## 2014-10-11 LAB — LIPID PANEL
Cholesterol: 126 mg/dL (ref 0–200)
HDL: 44.8 mg/dL (ref >39.00)
LDL CALC: 61 mg/dL (ref 0–99)
NONHDL: 81.2
Total CHOL/HDL Ratio: 3
Triglycerides: 103 mg/dL (ref 0.0–149.0)
VLDL: 20.6 mg/dL (ref 0.0–40.0)

## 2014-10-11 LAB — HEPATIC FUNCTION PANEL
ALT: 18 U/L (ref 0–35)
AST: 21 U/L (ref 0–37)
Albumin: 3.9 g/dL (ref 3.5–5.2)
Alkaline Phosphatase: 64 U/L (ref 39–117)
Bilirubin, Direct: 0.1 mg/dL (ref 0.0–0.3)
Total Bilirubin: 0.5 mg/dL (ref 0.2–1.2)
Total Protein: 6.5 g/dL (ref 6.0–8.3)

## 2014-10-11 LAB — BASIC METABOLIC PANEL
BUN: 11 mg/dL (ref 6–23)
CHLORIDE: 106 meq/L (ref 96–112)
CO2: 28 mEq/L (ref 19–32)
CREATININE: 0.82 mg/dL (ref 0.40–1.20)
Calcium: 9.1 mg/dL (ref 8.4–10.5)
GFR: 92.96 mL/min (ref >60.00)
GLUCOSE: 90 mg/dL (ref 70–99)
POTASSIUM: 3.9 meq/L (ref 3.5–5.1)
Sodium: 140 mEq/L (ref 135–145)

## 2014-10-11 LAB — TSH: TSH: 0.63 u[IU]/mL (ref 0.35–4.50)

## 2014-10-11 LAB — MAGNESIUM: Magnesium: 1.9 mg/dL (ref 1.5–2.5)

## 2014-10-11 LAB — LIPASE: Lipase: 16 U/L (ref 11.0–59.0)

## 2014-10-11 NOTE — Telephone Encounter (Signed)
Notified pt to have lab work.  She will go to [place] office today.  Scheduled her for a follow up on 10/15/14 to discuss lab work and see if an ultrasound is needed.

## 2014-10-14 LAB — CELIAC PANEL 10
Endomysial Screen: NEGATIVE
GLIADIN IGA: 3 U (ref <20)
Gliadin IgG: 1 Units (ref <20)
IgA: 128 mg/dL (ref 69–380)
TISSUE TRANSGLUT AB: 2 U/mL (ref <6)
TISSUE TRANSGLUTAMINASE AB, IGA: 1 U/mL (ref <4)

## 2014-10-15 ENCOUNTER — Encounter: Payer: 59 | Admitting: Internal Medicine

## 2014-10-15 NOTE — Progress Notes (Signed)
Document opened and reviewed for OV but appt  canceled same day .  

## 2014-10-17 ENCOUNTER — Ambulatory Visit (INDEPENDENT_AMBULATORY_CARE_PROVIDER_SITE_OTHER): Payer: 59 | Admitting: Internal Medicine

## 2014-10-17 ENCOUNTER — Encounter: Payer: Self-pay | Admitting: Internal Medicine

## 2014-10-17 VITALS — BP 126/78 | Temp 98.4°F | Ht 66.5 in | Wt 194.6 lb

## 2014-10-17 DIAGNOSIS — R140 Abdominal distension (gaseous): Secondary | ICD-10-CM

## 2014-10-17 DIAGNOSIS — R195 Other fecal abnormalities: Secondary | ICD-10-CM

## 2014-10-17 NOTE — Progress Notes (Signed)
Pre visit review using our clinic review tool, if applicable. No additional management support is needed unless otherwise documented below in the visit note.  Chief Complaint  Patient presents with  . Follow-up    HPI: [name] 54 y.o. comes in for fu of abnormal stools and gi s gas  Has noted improvement if decreases fatty foods fried etc. No fever new sx .   Had labs and stool tests done.  Only took probiotic couple days .  No fever bloo vomiting . ROS: See pertinent positives and negatives per HPI.  Past Medical History  Diagnosis Date  . Mitral valve prolapse   . Rapid heart beat     States "irreg heart beat"  . Hemorrhoids   . Anemia   . GERD (gastroesophageal reflux disease)   . Positive PPD   . Genital warts   . Hx of cardiac catheterization 2009    normal coronary arteries  . Hx of menorrhagia     Novasure  . Normal cardiac stress test 03/09/2012    no ischemia  . Hypokalemia   . Thyroid disease     Thyroid nodules  . Hypomagnesemia   . Hypophosphatemia     Family History  Problem Relation Age of Onset  . Heart disease Father     died Mi 54  . Hypertension Maternal Grandfather   . Diabetes Maternal Grandfather   . Heart disease Maternal Grandfather   . Colon cancer Maternal Grandfather   . Diabetes Maternal Grandmother     History   Social History  . Marital Status: Divorced    Spouse Name: N/A    Number of Children: 2  . Years of Education: 16   Occupational History  . dialysis tech    Social History Main Topics  . Smoking status: Never Smoker   . Smokeless tobacco: Never Used  . Alcohol Use: No  . Drug Use: No  . Sexual Activity: Yes   Other Topics Concern  . 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 She lived in [place] and in [place] for a number of years her mom is from  [place]. 16 years of education went to college at [employer] lives at home with her son who is in his 80s no pets   Neg ets tob etoh hx PA   6 hours of sleep   G2P2    TD2010  colonoscopy 2009   Considering own business           Outpatient Encounter Prescriptions as of 10/17/2014  Medication Sig  . CALCIUM PO Take by mouth daily.  . cholecalciferol (VITAMIN D) 1000 UNITS tablet Take 2,000 Units by mouth every morning.   . metoprolol tartrate (LOPRESSOR) 25 MG tablet Take 25 mg by mouth 2 (two) times daily.   [name] PARoxetine (PAXIL-CR) 25 MG 24 hr tablet Take 25 mg by mouth every morning.   . potassium chloride SA (K-DUR,KLOR-CON) 20 MEQ tablet Take 1 tablet (20 mEq total) by mouth 2 (two) times daily.  . [DISCONTINUED] metroNIDAZOLE (FLAGYL) 500 MG tablet Take 1 tablet (500 mg total) by mouth 2 (two) times daily.  . [DISCONTINUED] ondansetron (ZOFRAN ODT) 8 MG disintegrating tablet Take 1 tablet (8 mg total) by mouth every 8 (eight) hours as needed for nausea or vomiting. (Patient not taking: Reported on 09/26/2014)  EXAM:  BP 126/78 mmHg  Temp(Src) 98.4 F (36.9 C) (Oral)  Ht 5' 6.5" (1.689 m)  Wt 194 lb 9.6 oz (88.27 kg)  BMI 30.94 kg/m2  LMP 08/17/2014  Body mass index is 30.94 kg/(m^2).  GENERAL: vitals reviewed and listed above, alert, oriented, appears well hydrated and in no acute distress HEENT: atraumatic, conjunctiva  clear, no obvious abnormalities on inspection of external nose and ears PSYCH: pleasant and cooperative, no obvious depression or anxiety Labs reviewed  Lab Results  Component Value Date   WBC 6.0 10/11/2014   HGB 12.9 10/11/2014   HCT 40.2 10/11/2014   PLT 239.0 10/11/2014   GLUCOSE 90 10/11/2014   CHOL 126 10/11/2014   TRIG 103.0 10/11/2014   HDL 44.80 10/11/2014   LDLCALC 61 10/11/2014   ALT 18 10/11/2014   AST 21 10/11/2014   NA 140 10/11/2014   K 3.9 10/11/2014   CL 106 10/11/2014   CREATININE 0.82 10/11/2014   BUN 11 10/11/2014   CO2 28  10/11/2014   TSH 0.63 10/11/2014   INR 0.95 03/10/2012   HGBA1C 6.0 09/29/2012   celiac panel normal ASSESSMENT AND PLAN:  Discussed the following assessment and plan:  Abnormal stools - improved  with gas better . plan abd [place] to check gallbladder pancreas area  - Plan: US Abdomen Complete  Postprandial bloating - Plan: US Abdomen Complete Change cpx to next year  And  Lab bmp and magnesium  -Patient advised to return or notify health care team  if symptoms worsen ,persist or new concerns arise.  Patient Instructions  At this time  Diet  Manipulation   For symptoms.   Blood tests  Are good .   Will order abd ultrasound as we discussed .  If  Problematic we can get you to see GI but at this time  Can just follow.   I f all  ok then bmp and magnesium  Lab in 6 months   But Yearly visit    [name] K. Terril Chestnut M.D.

## 2014-10-17 NOTE — Patient Instructions (Signed)
At this time  Diet  Manipulation   For symptoms.   Blood tests  Are good .   Will order abd ultrasound as we discussed .  If  Problematic we can get you to see GI but at this time  Can just follow.   I f all  ok then bmp and magnesium  Lab in 6 months   But Yearly visit

## 2014-10-28 ENCOUNTER — Other Ambulatory Visit: Payer: 59

## 2014-11-01 ENCOUNTER — Ambulatory Visit
Admission: RE | Admit: 2014-11-01 | Discharge: 2014-11-01 | Disposition: A | Discharge status: Home / Self Care | Payer: 59 | Source: Ambulatory Visit | Attending: Internal Medicine | Admitting: Internal Medicine

## 2014-11-01 DIAGNOSIS — R195 Other fecal abnormalities: Secondary | ICD-10-CM

## 2014-11-01 DIAGNOSIS — R140 Abdominal distension (gaseous): Secondary | ICD-10-CM

## 2014-12-04 ENCOUNTER — Emergency Department (HOSPITAL_COMMUNITY)
Admission: EM | Admit: 2014-12-04 | Discharge: 2014-12-04 | Disposition: A | Discharge status: Home / Self Care | Payer: 59 | Attending: Emergency Medicine | Admitting: Emergency Medicine

## 2014-12-04 ENCOUNTER — Encounter (HOSPITAL_COMMUNITY): Payer: Self-pay | Admitting: *Deleted

## 2014-12-04 DIAGNOSIS — M546 Pain in thoracic spine: Secondary | ICD-10-CM | POA: Diagnosis not present

## 2014-12-04 DIAGNOSIS — Z8719 Personal history of other diseases of the digestive system: Secondary | ICD-10-CM | POA: Diagnosis not present

## 2014-12-04 DIAGNOSIS — Z79899 Other long term (current) drug therapy: Secondary | ICD-10-CM | POA: Diagnosis not present

## 2014-12-04 DIAGNOSIS — E876 Hypokalemia: Secondary | ICD-10-CM | POA: Insufficient documentation

## 2014-12-04 DIAGNOSIS — Z862 Personal history of diseases of the blood and blood-forming organs and certain disorders involving the immune mechanism: Secondary | ICD-10-CM | POA: Diagnosis not present

## 2014-12-04 DIAGNOSIS — Z8742 Personal history of other diseases of the female genital tract: Secondary | ICD-10-CM | POA: Diagnosis not present

## 2014-12-04 DIAGNOSIS — Z8619 Personal history of other infectious and parasitic diseases: Secondary | ICD-10-CM | POA: Diagnosis not present

## 2014-12-04 DIAGNOSIS — Z9889 Other specified postprocedural states: Secondary | ICD-10-CM | POA: Insufficient documentation

## 2014-12-04 DIAGNOSIS — Z8679 Personal history of other diseases of the circulatory system: Secondary | ICD-10-CM | POA: Diagnosis not present

## 2014-12-04 DIAGNOSIS — M549 Dorsalgia, unspecified: Secondary | ICD-10-CM

## 2014-12-04 MED ORDER — TRAMADOL HCL 50 MG PO TABS: 50.0000 mg | ORAL_TABLET | Freq: Four times a day (QID) | ORAL | Status: DC | PRN

## 2014-12-04 NOTE — Discharge Instructions (Signed)
Take the prescribed medication as directed for pain.  May continue taking flexeril with this if needed. Follow-up with your primary care physician. Return to the ED for new or worsening symptoms.

## 2014-12-04 NOTE — ED Provider Notes (Signed)
CSN: [id number]     Arrival date & time 12/04/14  1829 History   First MD Initiated Contact with Patient 12/04/14 0559     Chief Complaint  Patient presents with  . Back Pain     (Consider location/radiation/quality/duration/timing/severity/associated sxs/prior Treatment) Patient is a 54 y.o. female presenting with back pain. The history is provided by the patient and medical records.  Back Pain   This is a 54 y.o. F with PMH significant for anemia, GERD, MVP, presenting to the ED for diffuse back pain.  Patient states she has chronic DJD and herniated discs in her neck and lower back.  States she has pain on a daily basis, more pronounced in her lumbar spine.  Pain is notably worse in the morning and she feels "stiff" upon waking.  Patient states 2 days ago she was in the shower and twisted too far around and since has had increased back pain in her thoracic spine.  States she was awake almost all night with back pain last night.  States at one point she felt that her arms were going number and were "tingling" but this improved after rolling over onto her back.  She denies any weakness of her paresthesias.  She denies chest pain or SOB.  Patient has not been taking any medications for her symptoms recently but she did take a flexeril on the was to the ED and symptoms have improved since that time.  No fever, chills, sweats.  No hx of IVDU or cancer.  VSS.  Past Medical History  Diagnosis Date  . Mitral valve prolapse   . Rapid heart beat     States "irreg heart beat"  . Hemorrhoids   . Anemia   . GERD (gastroesophageal reflux disease)   . Positive PPD   . Genital warts   . Hx of cardiac catheterization 2009    normal coronary arteries  . Hx of menorrhagia     Novasure  . Normal cardiac stress test 03/09/2012    no ischemia  . Hypokalemia   . Thyroid disease     Thyroid nodules  . Hypomagnesemia   . Hypophosphatemia    Past Surgical History  Procedure Laterality Date  . Novasure  ablation    . Upper gastrointestinal endoscopy  May 2013  . Cardiac catheterization      x2 with normal results per pt   Family History  Problem Relation Age of Onset  . Heart disease Father     died Mi 5  . Hypertension Maternal Grandfather   . Diabetes Maternal Grandfather   . Heart disease Maternal Grandfather   . Colon cancer Maternal Grandfather   . Diabetes Maternal Grandmother    History  Substance Use Topics  . Smoking status: Never Smoker   . Smokeless tobacco: Never Used  . Alcohol Use: No   OB History    No data available     Review of Systems  Musculoskeletal: Positive for back pain.  All other systems reviewed and are negative.     Allergies  Ceftriaxone sodium and Doxycycline  Home Medications   Prior to Admission medications   Medication Sig Start Date End Date Taking? Authorizing Provider  CALCIUM PO Take by mouth daily.   Yes Historical Provider, MD  cholecalciferol (VITAMIN D) 1000 UNITS tablet Take 2,000 Units by mouth every morning.    Yes Historical Provider, MD  cyclobenzaprine (FLEXERIL) 5 MG tablet Take 5 mg by mouth at bedtime as needed. 11/19/14  Yes Historical Provider, MD  metoprolol (LOPRESSOR) 50 MG tablet Take 25 mg by mouth 2 (two) times daily. 11/06/14  Yes Historical Provider, MD  PARoxetine (PAXIL-CR) 25 MG 24 hr tablet Take 25 mg by mouth every morning.    Yes Historical Provider, MD  potassium chloride SA (K-DUR,KLOR-CON) 20 MEQ tablet Take 1 tablet (20 mEq total) by mouth 2 (two) times daily. 09/26/14  Yes Burnis Medin, MD  vitamin E 100 UNIT capsule Take 200 Units by mouth daily.   Yes Historical Provider, MD   BP 113/63 mmHg  Pulse 52  Temp(Src) 97.5 F (36.4 C) (Oral)  Resp 18  Ht 5\' 6"  (1.676 m)  Wt 194 lb (87.998 kg)  BMI 31.33 kg/m2  SpO2 100%   Physical Exam  Constitutional: She is oriented to person, place, and time. She appears well-developed and well-nourished. No distress.  HENT:  Head: Normocephalic and  atraumatic.  Mouth/Throat: Oropharynx is clear and moist.  Eyes: Conjunctivae and EOM are normal. Pupils are equal, round, and reactive to light.  Neck: Normal range of motion. Neck supple.  Cardiovascular: Normal rate, regular rhythm and normal heart sounds.   Pulmonary/Chest: Effort normal and breath sounds normal. No respiratory distress. She has no wheezes.  Abdominal: Soft. Bowel sounds are normal. There is no tenderness. There is no guarding.  Musculoskeletal: Normal range of motion.       Thoracic back: She exhibits tenderness and pain. She exhibits no bony tenderness.       Back:  Tenderness of thoracic paraspinal regions without midline tenderness or deformities; full ROM maintained; normal grip strength of BUE; strong radial pulses and cap refill bilaterally; normal sensation throughout both arms  Neurological: She is alert and oriented to person, place, and time.  AAOx3, answering questions appropriately; equal strength UE and LE bilaterally; CN grossly intact; moves all extremities appropriately without ataxia; no focal neuro deficits or facial asymmetry appreciated  Skin: Skin is warm and dry. She is not diaphoretic.  Psychiatric: She has a normal mood and affect.  Nursing note and vitals reviewed.   ED Course  Procedures (including critical care time) Labs Review Labs Reviewed - No data to display  Imaging Review No results found.   EKG Interpretation None      MDM   Final diagnoses:  Back pain, unspecified location   54 y.o. F with increased back pain after twisting in the shower 2 days ago.  She did mention some "tingling" in her arms in the middle of the night however this seems positional as it resolved after turning on her back.  On exam, she is in NAD-- she is actually asleep when i approached her stretcher.  She has tenderness of her thoracic paraspinal region but no midline tenderness or deformities.  She has no focal neurologic deficits-- normal strength,  normal coordination, normal sensation, and arms are NVI.  No red flag symptoms regarding her pain.  She states her pain has improved after taking flexeril on the way to ED.  No reported chest pain or SOB.  Given decreased pain and normal neurologic exam, low suspicion for emergent spinal pathology, this is likely an exacerbation of her chronic pain.  Patient discharged home with tramadol as she does not want any "strong pain meds like vicodin".  Encouraged close FU with PCP.  Discussed plan with patient, he/she acknowledged understanding and agreed with plan of care.  Return precautions given for new or worsening symptoms.  Larene Pickett, PA-C 12/04/14  [place], MD 12/04/14 [phone]

## 2014-12-04 NOTE — ED Notes (Signed)
Pt states she's been up all night w/ back pain, states all her back hurts, hx of DJD to lower back, then other day in shower turned the wrong way and having mid back pain, states when raises arms they tingle and go numb, then has a bulging disc to upper part of back/neck.

## 2014-12-04 NOTE — ED Notes (Signed)
Pt states took a flexeril on way here, pt states is helping her some.

## 2014-12-19 ENCOUNTER — Encounter (HOSPITAL_COMMUNITY): Payer: Self-pay | Admitting: Emergency Medicine

## 2014-12-19 ENCOUNTER — Emergency Department (HOSPITAL_COMMUNITY)
Admission: EM | Admit: 2014-12-19 | Discharge: 2014-12-19 | Disposition: A | Discharge status: Home / Self Care | Payer: 59 | Source: Home / Self Care | Attending: Family Medicine | Admitting: Family Medicine

## 2014-12-19 ENCOUNTER — Emergency Department (INDEPENDENT_AMBULATORY_CARE_PROVIDER_SITE_OTHER): Payer: 59

## 2014-12-19 DIAGNOSIS — K219 Gastro-esophageal reflux disease without esophagitis: Secondary | ICD-10-CM

## 2014-12-19 MED ORDER — GI COCKTAIL ~~LOC~~
30.0000 mL | Freq: Once | ORAL | Status: AC
  Administered 2014-12-19: 30 mL via ORAL

## 2014-12-19 MED ORDER — GI COCKTAIL ~~LOC~~
ORAL | Status: AC
  Filled 2014-12-19: qty 30

## 2014-12-19 NOTE — ED Provider Notes (Signed)
CSN: [id number]     Arrival date & time 12/19/14  1407 History   First MD Initiated Contact with Patient 12/19/14 1442     Chief Complaint  Patient presents with  . Back Pain  . Palpitations   (Consider location/radiation/quality/duration/timing/severity/associated sxs/prior Treatment) Patient is a 54 y.o. female presenting with back pain. The history is provided by the patient.  Back Pain Location:  Thoracic spine Quality:  Shooting Radiates to:  Does not radiate Pain severity:  Mild Onset quality:  Sudden Duration:  6 hours Progression:  Improving Chronicity:  New Context comment:  Awoke this am with right arm, thoracic pain Associated symptoms: no abdominal pain, no abdominal swelling, no bladder incontinence, no fever and no paresthesias   Associated symptoms comment:  Gerd - belching.   Past Medical History  Diagnosis Date  . Mitral valve prolapse   . Rapid heart beat     States "irreg heart beat"  . Hemorrhoids   . Anemia   . GERD (gastroesophageal reflux disease)   . Positive PPD   . Genital warts   . Hx of cardiac catheterization 2009    normal coronary arteries  . Hx of menorrhagia     Novasure  . Normal cardiac stress test 03/09/2012    no ischemia  . Hypokalemia   . Thyroid disease     Thyroid nodules  . Hypomagnesemia   . Hypophosphatemia    Past Surgical History  Procedure Laterality Date  . Novasure ablation    . Upper gastrointestinal endoscopy  May 2013  . Cardiac catheterization      x2 with normal results per pt   Family History  Problem Relation Age of Onset  . Heart disease Father     died Mi 4  . Hypertension Maternal Grandfather   . Diabetes Maternal Grandfather   . Heart disease Maternal Grandfather   . Colon cancer Maternal Grandfather   . Diabetes Maternal Grandmother    History  Substance Use Topics  . Smoking status: Never Smoker   . Smokeless tobacco: Never Used  . Alcohol Use: No   OB History    No data available      Review of Systems  Constitutional: Negative.  Negative for fever.  Cardiovascular: Positive for palpitations.  Gastrointestinal: Negative for abdominal pain.  Genitourinary: Negative for bladder incontinence.  Musculoskeletal: Positive for back pain.  Neurological: Negative for paresthesias.    Allergies  Ceftriaxone sodium and Doxycycline  Home Medications   Prior to Admission medications   Medication Sig Start Date End Date Taking? Authorizing Provider  CALCIUM PO Take by mouth daily.    Historical Provider, MD  cholecalciferol (VITAMIN D) 1000 UNITS tablet Take 2,000 Units by mouth every morning.     Historical Provider, MD  cyclobenzaprine (FLEXERIL) 5 MG tablet Take 5 mg by mouth at bedtime as needed. 11/19/14   Historical Provider, MD  metoprolol (LOPRESSOR) 50 MG tablet Take 25 mg by mouth 2 (two) times daily. 11/06/14   Historical Provider, MD  PARoxetine (PAXIL-CR) 25 MG 24 hr tablet Take 25 mg by mouth every morning.     Historical Provider, MD  potassium chloride SA (K-DUR,KLOR-CON) 20 MEQ tablet Take 1 tablet (20 mEq total) by mouth 2 (two) times daily. 09/26/14   Burnis Medin, MD  traMADol (ULTRAM) 50 MG tablet Take 1 tablet (50 mg total) by mouth every 6 (six) hours as needed. 12/04/14   Larene Pickett, PA-C  vitamin E 100 UNIT capsule  Take 200 Units by mouth daily.    Historical Provider, MD   BP 130/84 mmHg  Pulse 87  Resp 18  SpO2 100% Physical Exam  Constitutional: She is oriented to person, place, and time. She appears well-developed and well-nourished. No distress.  HENT:  Head: Normocephalic.  Mouth/Throat: Oropharynx is clear and moist.  Eyes: Conjunctivae and EOM are normal. Pupils are equal, round, and reactive to light.  Neck: Normal range of motion. Neck supple.  Cardiovascular: Normal rate, regular rhythm, normal heart sounds and intact distal pulses.   Pulmonary/Chest: Effort normal and breath sounds normal.  Lymphadenopathy:    She has no cervical  adenopathy.  Neurological: She is alert and oriented to person, place, and time.  Skin: Skin is warm and dry.  Nursing note and vitals reviewed.   ED Course  Procedures (including critical care time) Labs Review Labs Reviewed - No data to display  Imaging Review Dg Chest 2 View  12/19/2014   CLINICAL DATA:  Right shoulder pain radiating to the back.  EXAM: CHEST  2 VIEW  COMPARISON:  PA and lateral chest 01/21/2013.  FINDINGS: The lungs are clear. Heart size is normal. No pneumothorax or pleural effusion.  IMPRESSION: Negative chest.   Electronically Signed   By: Inge Rise M.D.   On: 12/19/2014 15:15   X-rays reviewed and report per radiologist.   MDM   1. GERD without esophagitis    Sx relieved at d/c after gi cocktail.    Billy Fischer, MD 12/19/14 [phone]

## 2014-12-19 NOTE — ED Notes (Signed)
Questioned whether patient receiving a script-no chang e in medications at this time per dr Juventino Slovak

## 2014-12-19 NOTE — ED Notes (Signed)
Ate popcorn and cheesecake past night

## 2014-12-19 NOTE — Discharge Instructions (Signed)
Take your medicine as prescribed and see your doctor if further problems.

## 2014-12-19 NOTE — ED Notes (Signed)
Woke with right shoulder pain, then radiating across back, and associated with indigestion and irregular heart beat, palpitations, reports none currently and no chest pain.

## 2014-12-26 ENCOUNTER — Telehealth: Payer: Self-pay | Admitting: Internal Medicine

## 2014-12-26 NOTE — Telephone Encounter (Signed)
Patient states her stool is now rich, green color and gas.  She said she has talked to Dr. Regis Bill about this before and would like a callback to advise a plan of care.

## 2014-12-27 NOTE — Telephone Encounter (Signed)
LM for the pt to return my call.  Per last note, if persistent than will get GI to see her.

## 2014-12-30 NOTE — Telephone Encounter (Signed)
Left message on voicemail to call office.  

## 2015-01-01 NOTE — Telephone Encounter (Signed)
LM for the pt to return my call.  Have attempted to reach the pt multiple times.  Will now close the note.

## 2015-01-07 ENCOUNTER — Encounter: Payer: 59 | Admitting: Internal Medicine

## 2015-01-12 ENCOUNTER — Encounter (HOSPITAL_COMMUNITY): Payer: Self-pay | Admitting: Emergency Medicine

## 2015-01-12 ENCOUNTER — Emergency Department (INDEPENDENT_AMBULATORY_CARE_PROVIDER_SITE_OTHER)
Admission: EM | Admit: 2015-01-12 | Discharge: 2015-01-12 | Disposition: A | Discharge status: Home / Self Care | Payer: 59 | Source: Home / Self Care | Attending: Family Medicine | Admitting: Family Medicine

## 2015-01-12 DIAGNOSIS — R197 Diarrhea, unspecified: Secondary | ICD-10-CM

## 2015-01-12 LAB — POCT I-STAT, CHEM 8
BUN: 7 mg/dL (ref 6–20)
CHLORIDE: 104 mmol/L (ref 101–111)
Calcium, Ion: 1.17 mmol/L (ref 1.12–1.23)
Creatinine, Ser: 0.7 mg/dL (ref 0.44–1.00)
Glucose, Bld: 82 mg/dL (ref 70–99)
HEMATOCRIT: 46 % (ref 36.0–46.0)
Hemoglobin: 15.6 g/dL — ABNORMAL HIGH (ref 12.0–15.0)
Potassium: 3.7 mmol/L (ref 3.5–5.1)
SODIUM: 140 mmol/L (ref 135–145)
TCO2: 22 mmol/L (ref 0–100)

## 2015-01-12 LAB — CLOSTRIDIUM DIFFICILE BY PCR: CDIFFPCR: NEGATIVE

## 2015-01-12 MED ORDER — DIPHENOXYLATE-ATROPINE 2.5-0.025 MG PO TABS: 1.0000 | ORAL_TABLET | Freq: Four times a day (QID) | ORAL | Status: DC | PRN

## 2015-01-12 NOTE — ED Provider Notes (Signed)
CSN: [id number]     Arrival date & time 01/12/15  0919 History   First MD Initiated Contact with Patient 01/12/15 1103     Chief Complaint  Patient presents with  . Diarrhea   (Consider location/radiation/quality/duration/timing/severity/associated sxs/prior Treatment) HPI Comments: Switched to clears Tried  pepto bismal, protonix and probiotic Works at dialysis center  Patient is a 54 y.o. female presenting with diarrhea. The history is provided by the patient.  Diarrhea Quality:  Malodorous and watery Severity:  Severe Onset quality:  Gradual Number of episodes:  Countless Duration:  3 days Timing:  Constant Progression:  Unchanged Relieved by:  Nothing Associated symptoms: chills   Associated symptoms: no abdominal pain, no diaphoresis, no fever, no headaches, no myalgias, no URI and no vomiting   Associated symptoms comment:  +occasional abdominal cramping Risk factors: no recent antibiotic use, no sick contacts, no suspicious food intake and no travel to endemic areas     Past Medical History  Diagnosis Date  . Mitral valve prolapse   . Rapid heart beat     States "irreg heart beat"  . Hemorrhoids   . Anemia   . GERD (gastroesophageal reflux disease)   . Positive PPD   . Genital warts   . Hx of cardiac catheterization 2009    normal coronary arteries  . Hx of menorrhagia     Novasure  . Normal cardiac stress test 03/09/2012    no ischemia  . Hypokalemia   . Thyroid disease     Thyroid nodules  . Hypomagnesemia   . Hypophosphatemia    Past Surgical History  Procedure Laterality Date  . Novasure ablation    . Upper gastrointestinal endoscopy  May 2013  . Cardiac catheterization      x2 with normal results per pt   Family History  Problem Relation Age of Onset  . Heart disease Father     died Mi 75  . Hypertension Maternal Grandfather   . Diabetes Maternal Grandfather   . Heart disease Maternal Grandfather   . Colon cancer Maternal Grandfather   .  Diabetes Maternal Grandmother    History  Substance Use Topics  . Smoking status: Never Smoker   . Smokeless tobacco: Never Used  . Alcohol Use: No   OB History    No data available     Review of Systems  Constitutional: Positive for chills and appetite change. Negative for fever and diaphoresis.  HENT: Negative.   Eyes: Negative.   Respiratory: Negative.   Cardiovascular: Negative.   Gastrointestinal: Positive for nausea, diarrhea and abdominal distention. Negative for vomiting, abdominal pain, constipation and blood in stool.  Genitourinary: Negative.   Musculoskeletal: Negative.  Negative for myalgias.  Skin: Negative.   Neurological: Negative for dizziness, weakness and headaches.    Allergies  Ceftriaxone sodium and Doxycycline  Home Medications   Prior to Admission medications   Medication Sig Start Date End Date Taking? Authorizing Provider  CALCIUM PO Take by mouth daily.    Historical Provider, MD  cholecalciferol (VITAMIN D) 1000 UNITS tablet Take 2,000 Units by mouth every morning.     Historical Provider, MD  cyclobenzaprine (FLEXERIL) 5 MG tablet Take 5 mg by mouth at bedtime as needed. 11/19/14   Historical Provider, MD  diphenoxylate-atropine (LOMOTIL) 2.5-0.025 MG per tablet Take 1 tablet by mouth 4 (four) times daily as needed for diarrhea or loose stools. 01/12/15   Audelia Hives Taysen Bushart, PA  metoprolol (LOPRESSOR) 50 MG tablet Take 25  mg by mouth 2 (two) times daily. 11/06/14   Historical Provider, MD  PARoxetine (PAXIL-CR) 25 MG 24 hr tablet Take 25 mg by mouth every morning.     Historical Provider, MD  potassium chloride SA (K-DUR,KLOR-CON) 20 MEQ tablet Take 1 tablet (20 mEq total) by mouth 2 (two) times daily. 09/26/14   Burnis Medin, MD  traMADol (ULTRAM) 50 MG tablet Take 1 tablet (50 mg total) by mouth every 6 (six) hours as needed. 12/04/14   Larene Pickett, PA-C  vitamin E 100 UNIT capsule Take 200 Units by mouth daily.    Historical Provider, MD    BP 113/71 mmHg  Pulse 71  Temp(Src) 98.1 F (36.7 C) (Oral)  Resp 16  SpO2 100% Physical Exam  Constitutional: She is oriented to person, place, and time. She appears well-developed and well-nourished. No distress.  HENT:  Head: Normocephalic and atraumatic.  Mouth/Throat: Oropharynx is clear and moist.  Eyes: Conjunctivae are normal. No scleral icterus.  Neck: Normal range of motion. Neck supple.  Cardiovascular: Normal rate, regular rhythm and normal heart sounds.   Pulmonary/Chest: Effort normal and breath sounds normal. No respiratory distress. She has no wheezes.  Abdominal: Soft. Bowel sounds are normal. She exhibits no distension and no mass. There is no tenderness. There is no rebound and no guarding.  Musculoskeletal: Normal range of motion.  Neurological: She is alert and oriented to person, place, and time.  Skin: Skin is warm and dry.  Psychiatric: She has a normal mood and affect. Her behavior is normal.  Nursing note and vitals reviewed.   ED Course  Procedures (including critical care time) Labs Review Labs Reviewed  CLOSTRIDIUM DIFFICILE BY PCR  STOOL CULTURE    Imaging Review No results found.   MDM   1. Diarrhea    Istat 8: normal Stool specimen sent for culture and C-diff. Results pend. Will advise continued oral re-hydration strategies at home and Lomotil as directed Desitin barrier cream to prevent skin irritation Will contact if results indicate the need for treatment.  Stick to clear liquid diet Follow up with Dr. Regis Bill if symptoms persist, blood in stool, abdominal pain or fever.     Lutricia Feil, [place] 01/12/15 [phone]

## 2015-01-12 NOTE — Discharge Instructions (Signed)
Blood chemistry is normal Stool specimen sent for culture and C-diff. Results pending Will advise continued oral re-hydration strategies at home and Lomotil as directed Desitin barrier cream to prevent skin irritation Will contact if results indicate the need for treatment.  Stick to clear liquid diet Follow up with Dr. Regis Bill if symptoms persist, blood in stool, abdominal pain or fever.  Diarrhea Diarrhea is frequent loose and watery bowel movements. It can cause you to feel weak and dehydrated. Dehydration can cause you to become tired and thirsty, have a dry mouth, and have decreased urination that often is dark yellow. Diarrhea is a sign of another problem, most often an infection that will not last long. In most cases, diarrhea typically lasts 2-3 days. However, it can last longer if it is a sign of something more serious. It is important to treat your diarrhea as directed by your caregiver to lessen or prevent future episodes of diarrhea. CAUSES  Some common causes include:  Gastrointestinal infections caused by viruses, bacteria, or parasites.  Food poisoning or food allergies.  Certain medicines, such as antibiotics, chemotherapy, and laxatives.  Artificial sweeteners and fructose.  Digestive disorders. HOME CARE INSTRUCTIONS  Ensure adequate fluid intake (hydration): Have 1 cup (8 oz) of fluid for each diarrhea episode. Avoid fluids that contain simple sugars or sports drinks, fruit juices, whole milk products, and sodas. Your urine should be clear or pale yellow if you are drinking enough fluids. Hydrate with an oral rehydration solution that you can purchase at pharmacies, retail stores, and online. You can prepare an oral rehydration solution at home by mixing the following ingredients together:   - tsp table salt.   tsp baking soda.   tsp salt substitute containing potassium chloride.  1  tablespoons sugar.  1 L (34 oz) of water.  Certain foods and beverages may  increase the speed at which food moves through the gastrointestinal (GI) tract. These foods and beverages should be avoided and include:  Caffeinated and alcoholic beverages.  High-fiber foods, such as raw fruits and vegetables, nuts, seeds, and whole grain breads and cereals.  Foods and beverages sweetened with sugar alcohols, such as xylitol, sorbitol, and mannitol.  Some foods may be well tolerated and may help thicken stool including:  Starchy foods, such as rice, toast, pasta, low-sugar cereal, oatmeal, grits, baked potatoes, crackers, and bagels.  Bananas.  Applesauce.  Add probiotic-rich foods to help increase healthy bacteria in the GI tract, such as yogurt and fermented milk products.  Wash your hands well after each diarrhea episode.  Only take over-the-counter or prescription medicines as directed by your caregiver.  Take a warm bath to relieve any burning or pain from frequent diarrhea episodes. SEEK IMMEDIATE MEDICAL CARE IF:   You are unable to keep fluids down.  You have persistent vomiting.  You have blood in your stool, or your stools are black and tarry.  You do not urinate in 6-8 hours, or there is only a small amount of very dark urine.  You have abdominal pain that increases or localizes.  You have weakness, dizziness, confusion, or light-headedness.  You have a severe headache.  Your diarrhea gets worse or does not get better.  You have a fever or persistent symptoms for more than 2-3 days.  You have a fever and your symptoms suddenly get worse. MAKE SURE YOU:   Understand these instructions.  Will watch your condition.  Will get help right away if you are not doing well or  get worse. Document Released: 08/20/2002 Document Revised: 01/14/2014 Document Reviewed: 05/07/2012 [facility] Patient Information 2015 [place], [place]. This information is not intended to replace advice given to you by your health care provider. Make sure you discuss any  questions you have with your health care provider.

## 2015-01-12 NOTE — ED Notes (Signed)
C/o diarrhea since friday States stool is runny States anus is sore due to bowel movements States abd area is bloated States she is able to pass gas Patient has been pumped fluids and broth Tried probiotics and pepto bismol

## 2015-01-13 ENCOUNTER — Telehealth: Payer: Self-pay | Admitting: Family Medicine

## 2015-01-13 DIAGNOSIS — R197 Diarrhea, unspecified: Secondary | ICD-10-CM

## 2015-01-13 DIAGNOSIS — R195 Other fecal abnormalities: Secondary | ICD-10-CM

## 2015-01-13 NOTE — Telephone Encounter (Signed)
Follow up call to patient about her diarrhea. that sent her to the ED      As we dsicussed in previous office visit : if recurring And severe May we refer her To her [place] doctor ?    ----- Message -----     From: SYSTEM     Sent: 01/12/2015 12:33 PM      To: Burnis Medin, MD

## 2015-01-13 NOTE — Telephone Encounter (Signed)
Spoke to the pt.  She agreed to go forward with referral to gi.  Referral placed in the system.

## 2015-01-16 LAB — STOOL CULTURE

## 2015-02-05 ENCOUNTER — Encounter (HOSPITAL_COMMUNITY): Payer: Self-pay | Admitting: Emergency Medicine

## 2015-02-05 ENCOUNTER — Emergency Department (HOSPITAL_COMMUNITY)
Admission: EM | Admit: 2015-02-05 | Discharge: 2015-02-05 | Disposition: A | Discharge status: Home / Self Care | Payer: 59 | Source: Home / Self Care | Attending: Family Medicine | Admitting: Family Medicine

## 2015-02-05 DIAGNOSIS — L237 Allergic contact dermatitis due to plants, except food: Secondary | ICD-10-CM | POA: Diagnosis not present

## 2015-02-05 MED ORDER — METHYLPREDNISOLONE ACETATE 80 MG/ML IJ SUSP
INTRAMUSCULAR | Status: AC
  Filled 2015-02-05: qty 1

## 2015-02-05 MED ORDER — HYDROXYZINE HCL 50 MG PO TABS: 50.0000 mg | ORAL_TABLET | Freq: Four times a day (QID) | ORAL | Status: DC

## 2015-02-05 MED ORDER — METHYLPREDNISOLONE ACETATE 40 MG/ML IJ SUSP
80.0000 mg | Freq: Once | INTRAMUSCULAR | Status: AC
  Administered 2015-02-05: 80 mg via INTRAMUSCULAR

## 2015-02-05 MED ORDER — TRIAMCINOLONE ACETONIDE 40 MG/ML IJ SUSP
INTRAMUSCULAR | Status: AC
  Filled 2015-02-05: qty 1

## 2015-02-05 MED ORDER — TRIAMCINOLONE ACETONIDE 40 MG/ML IJ SUSP
40.0000 mg | Freq: Once | INTRAMUSCULAR | Status: AC
  Administered 2015-02-05: 40 mg via INTRAMUSCULAR

## 2015-02-05 NOTE — ED Notes (Signed)
Pt states she was exposed to poison ivy three days ago in her yard.  Pt complains of itching on her hands and forearms, with some evidence of a few bumps.  She also reports some itching on BLE.

## 2015-02-05 NOTE — ED Provider Notes (Signed)
CSN: [id number]     Arrival date & time 02/05/15  1857 History   First MD Initiated Contact with Patient 02/05/15 2002     Chief Complaint  Patient presents with  . Pruritis   (Consider location/radiation/quality/duration/timing/severity/associated sxs/prior Treatment) Patient is a 54 y.o. female presenting with rash. The history is provided by the patient.  Rash Location:  Full body Quality: blistering, draining, itchiness and weeping   Severity:  Mild Onset quality:  Gradual Duration:  3 days Progression:  Spreading Chronicity:  New Context: plant contact   Relieved by:  None tried Worsened by:  Nothing tried Ineffective treatments:  None tried Associated symptoms: no throat swelling and no tongue swelling     Past Medical History  Diagnosis Date  . Mitral valve prolapse   . Rapid heart beat     States "irreg heart beat"  . Hemorrhoids   . Anemia   . GERD (gastroesophageal reflux disease)   . Positive PPD   . Genital warts   . Hx of cardiac catheterization 2009    normal coronary arteries  . Hx of menorrhagia     Novasure  . Normal cardiac stress test 03/09/2012    no ischemia  . Hypokalemia   . Thyroid disease     Thyroid nodules  . Hypomagnesemia   . Hypophosphatemia    Past Surgical History  Procedure Laterality Date  . Novasure ablation    . Upper gastrointestinal endoscopy  May 2013  . Cardiac catheterization      x2 with normal results per pt   Family History  Problem Relation Age of Onset  . Heart disease Father     died Mi 60  . Hypertension Maternal Grandfather   . Diabetes Maternal Grandfather   . Heart disease Maternal Grandfather   . Colon cancer Maternal Grandfather   . Diabetes Maternal Grandmother    History  Substance Use Topics  . Smoking status: Never Smoker   . Smokeless tobacco: Never Used  . Alcohol Use: No   OB History    No data available     Review of Systems  Constitutional: Negative.   Musculoskeletal: Negative.    Skin: Positive for rash. Negative for wound.    Allergies  Ceftriaxone sodium and Doxycycline  Home Medications   Prior to Admission medications   Medication Sig Start Date End Date Taking? Authorizing Provider  cholecalciferol (VITAMIN D) 1000 UNITS tablet Take 2,000 Units by mouth every morning.    Yes Historical Provider, MD  metoprolol (LOPRESSOR) 50 MG tablet Take 25 mg by mouth 2 (two) times daily. 11/06/14  Yes Historical Provider, MD  PARoxetine (PAXIL-CR) 25 MG 24 hr tablet Take 25 mg by mouth every morning.    Yes Historical Provider, MD  potassium chloride SA (K-DUR,KLOR-CON) 20 MEQ tablet Take 1 tablet (20 mEq total) by mouth 2 (two) times daily. 09/26/14  Yes Burnis Medin, MD  CALCIUM PO Take by mouth daily.    Historical Provider, MD  cyclobenzaprine (FLEXERIL) 5 MG tablet Take 5 mg by mouth at bedtime as needed. 11/19/14   Historical Provider, MD  diphenoxylate-atropine (LOMOTIL) 2.5-0.025 MG per tablet Take 1 tablet by mouth 4 (four) times daily as needed for diarrhea or loose stools. 01/12/15   Audelia Hives Presson, PA  hydrOXYzine (ATARAX/VISTARIL) 50 MG tablet Take 1 tablet (50 mg total) by mouth every 6 (six) hours. For itching 02/05/15   Billy Fischer, MD  traMADol (ULTRAM) 50 MG tablet Take  1 tablet (50 mg total) by mouth every 6 (six) hours as needed. 12/04/14   Larene Pickett, PA-C  vitamin E 100 UNIT capsule Take 200 Units by mouth daily.    Historical Provider, MD   BP 120/68 mmHg  Pulse 68  Temp(Src) 97.1 F (36.2 C) (Oral)  Resp 18  SpO2 98% Physical Exam  Constitutional: She is oriented to person, place, and time. She appears well-developed and well-nourished. No distress.  Neurological: She is alert and oriented to person, place, and time.  Skin: Skin is warm and dry. Rash noted.  Linear and patchy papulovesicular scattered rash over extremities bilat, weeping crusting lesions , pruritic.  Nursing note and vitals reviewed.   ED Course  Procedures  (including critical care time) Labs Review Labs Reviewed - No data to display  Imaging Review No results found.   MDM   1. Contact dermatitis due to poison ivy        Billy Fischer, MD 02/05/15 2022

## 2015-02-06 ENCOUNTER — Ambulatory Visit: Payer: Self-pay | Admitting: Podiatry

## 2015-02-08 ENCOUNTER — Emergency Department (HOSPITAL_COMMUNITY)
Admission: EM | Admit: 2015-02-08 | Discharge: 2015-02-08 | Disposition: A | Discharge status: Home / Self Care | Payer: 59 | Attending: Emergency Medicine | Admitting: Emergency Medicine

## 2015-02-08 ENCOUNTER — Encounter (HOSPITAL_COMMUNITY): Payer: Self-pay | Admitting: Emergency Medicine

## 2015-02-08 DIAGNOSIS — L259 Unspecified contact dermatitis, unspecified cause: Secondary | ICD-10-CM | POA: Diagnosis not present

## 2015-02-08 DIAGNOSIS — Z9889 Other specified postprocedural states: Secondary | ICD-10-CM | POA: Insufficient documentation

## 2015-02-08 DIAGNOSIS — Z8742 Personal history of other diseases of the female genital tract: Secondary | ICD-10-CM | POA: Diagnosis not present

## 2015-02-08 DIAGNOSIS — R21 Rash and other nonspecific skin eruption: Secondary | ICD-10-CM | POA: Diagnosis present

## 2015-02-08 DIAGNOSIS — Z79899 Other long term (current) drug therapy: Secondary | ICD-10-CM | POA: Diagnosis not present

## 2015-02-08 DIAGNOSIS — I341 Nonrheumatic mitral (valve) prolapse: Secondary | ICD-10-CM | POA: Diagnosis not present

## 2015-02-08 DIAGNOSIS — Z8619 Personal history of other infectious and parasitic diseases: Secondary | ICD-10-CM | POA: Insufficient documentation

## 2015-02-08 DIAGNOSIS — Z8719 Personal history of other diseases of the digestive system: Secondary | ICD-10-CM | POA: Insufficient documentation

## 2015-02-08 DIAGNOSIS — E876 Hypokalemia: Secondary | ICD-10-CM | POA: Insufficient documentation

## 2015-02-08 DIAGNOSIS — Z862 Personal history of diseases of the blood and blood-forming organs and certain disorders involving the immune mechanism: Secondary | ICD-10-CM | POA: Insufficient documentation

## 2015-02-08 MED ORDER — PREDNISONE 10 MG PO TABS: 20.0000 mg | ORAL_TABLET | Freq: Every day | ORAL | Status: DC

## 2015-02-08 NOTE — Discharge Instructions (Signed)
Scription for prednisone.  Please take all tablets as directed until completed.  Try to avoid hot situations such as hot showers or baths sitting in the sun becoming overheated as his produces histamine, which makes the itching and rash.  Worse

## 2015-02-08 NOTE — ED Notes (Signed)
Pt states after clearing vines from brush at her home on the 13th she began having pruritic rash to bilat arm and L leg.

## 2015-02-08 NOTE — ED Provider Notes (Signed)
CSN: [id number]     Arrival date & time 02/08/15  1932 History  This chart was scribed for Junius Creamer, NP, working with Dorie Rank, MD by Girtha Hake, ED Scribe. The patient was seen in [facility]. The patient's care was started at 8:18 PM.     Chief Complaint  Patient presents with  . Poison Ivy   HPI  HPI Comments: [name] is a 54 y.o. female who presents to the Emergency Department complaining of poison ivy beginning approximately 2 weeks ago. She reports that the rash first appeared after she was clearing brush from her yard at home. Patient was seen here 3 days ago for poison ivy. She reports that she is still experiencing new outbreaks on her arms and legs. She has applied calamine lotion with no relief. She states that she is experiencing minimal itchiness associated with the new outbreaks of the rash.    PCP is Dr. Regis Bill.    Past Medical History  Diagnosis Date  . Mitral valve prolapse   . Rapid heart beat     States "irreg heart beat"  . Hemorrhoids   . Anemia   . GERD (gastroesophageal reflux disease)   . Positive PPD   . Genital warts   . Hx of cardiac catheterization 2009    normal coronary arteries  . Hx of menorrhagia     Novasure  . Normal cardiac stress test 03/09/2012    no ischemia  . Hypokalemia   . Thyroid disease     Thyroid nodules  . Hypomagnesemia   . Hypophosphatemia    Past Surgical History  Procedure Laterality Date  . Novasure ablation    . Upper gastrointestinal endoscopy  May 2013  . Cardiac catheterization      x2 with normal results per pt   Family History  Problem Relation Age of Onset  . Heart disease Father     died Mi 61  . Hypertension Maternal Grandfather   . Diabetes Maternal Grandfather   . Heart disease Maternal Grandfather   . Colon cancer Maternal Grandfather   . Diabetes Maternal Grandmother    History  Substance Use Topics  . Smoking status: Never Smoker   . Smokeless tobacco: Never Used  . Alcohol Use:  No   OB History    No data available     Review of Systems  Skin: Positive for rash.      Allergies  Ceftriaxone sodium and Doxycycline  Home Medications   Prior to Admission medications   Medication Sig Start Date End Date Taking? Authorizing Provider  CALCIUM PO Take by mouth daily.    Historical Provider, MD  cholecalciferol (VITAMIN D) 1000 UNITS tablet Take 2,000 Units by mouth every morning.     Historical Provider, MD  cyclobenzaprine (FLEXERIL) 5 MG tablet Take 5 mg by mouth at bedtime as needed. 11/19/14   Historical Provider, MD  diphenoxylate-atropine (LOMOTIL) 2.5-0.025 MG per tablet Take 1 tablet by mouth 4 (four) times daily as needed for diarrhea or loose stools. 01/12/15   Audelia Hives Presson, PA  hydrOXYzine (ATARAX/VISTARIL) 50 MG tablet Take 1 tablet (50 mg total) by mouth every 6 (six) hours. For itching 02/05/15   Billy Fischer, MD  metoprolol (LOPRESSOR) 50 MG tablet Take 25 mg by mouth 2 (two) times daily. 11/06/14   Historical Provider, MD  PARoxetine (PAXIL-CR) 25 MG 24 hr tablet Take 25 mg by mouth every morning.     Historical Provider, MD  potassium chloride SA (K-DUR,KLOR-CON) 20 MEQ tablet Take 1 tablet (20 mEq total) by mouth 2 (two) times daily. 09/26/14   Burnis Medin, MD  predniSONE (DELTASONE) 10 MG tablet Take 2 tablets (20 mg total) by mouth daily. 02/08/15   Junius Creamer, NP  traMADol (ULTRAM) 50 MG tablet Take 1 tablet (50 mg total) by mouth every 6 (six) hours as needed. 12/04/14   Larene Pickett, PA-C  vitamin E 100 UNIT capsule Take 200 Units by mouth daily.    Historical Provider, MD   Triage Vitals: BP 119/73 mmHg  Pulse 66  Temp(Src) 98.1 F (36.7 C) (Oral)  Resp 20  SpO2 99% Physical Exam  Constitutional: She is oriented to person, place, and time. She appears well-developed and well-nourished.  HENT:  Head: Normocephalic.  Eyes: Pupils are equal, round, and reactive to light.  Neck: Normal range of motion.  Cardiovascular: Normal  rate and regular rhythm.   Pulmonary/Chest: Effort normal and breath sounds normal.  Musculoskeletal: Normal range of motion.  Neurological: She is alert and oriented to person, place, and time.  Skin: Rash noted.  Vesicular rash in various locations on extremities  Nursing note and vitals reviewed.   ED Course  Procedures (including critical care time) DIAGNOSTIC STUDIES: Oxygen Saturation is 99% on room air, normal by my interpretation.    COORDINATION OF CARE:    Labs Review Labs Reviewed - No data to display  Imaging Review No results found.   EKG Interpretation None     Will give patient additional by mouth.  Steroid-induced for 7 days MDM   Final diagnoses:  Contact dermatitis   I personally performed the services described in this documentation, which was scribed in my presence. The recorded information has been reviewed and is accurate.  Junius Creamer, NP 02/08/15 [id number]  Dorie Rank, MD 02/12/15 [phone]

## 2015-02-13 ENCOUNTER — Encounter: Payer: Self-pay | Admitting: *Deleted

## 2015-02-14 ENCOUNTER — Ambulatory Visit (INDEPENDENT_AMBULATORY_CARE_PROVIDER_SITE_OTHER): Payer: 59 | Admitting: Physician Assistant

## 2015-02-14 ENCOUNTER — Encounter: Payer: Self-pay | Admitting: Physician Assistant

## 2015-02-14 VITALS — BP 122/78 | HR 68 | Ht 66.25 in | Wt 192.5 lb

## 2015-02-14 DIAGNOSIS — K589 Irritable bowel syndrome without diarrhea: Secondary | ICD-10-CM

## 2015-02-14 DIAGNOSIS — R197 Diarrhea, unspecified: Secondary | ICD-10-CM | POA: Diagnosis not present

## 2015-02-14 DIAGNOSIS — R634 Abnormal weight loss: Secondary | ICD-10-CM | POA: Diagnosis not present

## 2015-02-14 MED ORDER — SACCHAROMYCES BOULARDII 250 MG PO CAPS: 250.0000 mg | ORAL_CAPSULE | Freq: Two times a day (BID) | ORAL | Status: DC

## 2015-02-14 MED ORDER — RIFAXIMIN 550 MG PO TABS: 550.0000 mg | ORAL_TABLET | Freq: Three times a day (TID) | ORAL | Status: DC

## 2015-02-14 NOTE — Progress Notes (Addendum)
Patient ID: [name], female   DOB: Feb 12, 1961, 54 y.o.   MRN: [id number]   Subjective:    Patient ID: [name], female    DOB: 1961/05/22, 54 y.o.   MRN: [id number]  HPI [name] is a very nice 54 year old female known to Dr. Hilarie Fredrickson from colonoscopy and EGD done in 2013. EGD was normal and she was continued on PPI therapy for GERD. Colonoscopy was done for complaints of change in bowel habits and rectal bleeding. She was noted to have a 4 mm polyp which was removed and biopsy confirmed a tubular adenoma. She also had mild diverticulosis and a normal terminal ileum. She is felt to have IBS. Other medical problems include sleep apnea, multinodular goiter and history of PSVT. She comes in today with complaints of alteration in her stools. She says she has noted green stools off and on which are concerning to her.'s is been over multiple months. She also has chronic problems with diarrhea which seems to come and go. She had undergone a workup in January 2016 by her PCP including fecal fat which was normal stool culture negative lactoferrin negative CBC was normal MCV is chronically low at 75 celiac panel negative. Patient says she had a prolonged episode of diarrhea in January and then just got over another episode of diarrhea which lasted for couple of weeks. She says these episodes come on every couple of months or associated with abdominal cramping bloating discomfort and may have multiple bowel movements per day. She says in between these episodes her stools go back to normal and she generally doesn't have any abdominal discomfort. She no she is lactose intolerant and says sometimes she does eat food containing lactose. She says she has problems with gas and bloating most of the time.  Review of Systems Pertinent positive and negative review of systems were noted in the above HPI section.  All other review of systems was otherwise negative.  Outpatient Encounter Prescriptions as of 02/14/2015   Medication Sig  . CALCIUM PO Take by mouth daily.  . cholecalciferol (VITAMIN D) 1000 UNITS tablet Take 2,000 Units by mouth every morning.   . cyclobenzaprine (FLEXERIL) 5 MG tablet Take 5 mg by mouth at bedtime as needed.  . metoprolol (LOPRESSOR) 50 MG tablet Take 25 mg by mouth 2 (two) times daily.  [name] PARoxetine (PAXIL-CR) 25 MG 24 hr tablet Take 25 mg by mouth every morning.   . potassium chloride SA (K-DUR,KLOR-CON) 20 MEQ tablet Take 1 tablet (20 mEq total) by mouth 2 (two) times daily.  . predniSONE (DELTASONE) 10 MG tablet Take 2 tablets (20 mg total) by mouth daily.  . vitamin E 100 UNIT capsule Take 200 Units by mouth daily.  . rifaximin (XIFAXAN) 550 MG TABS tablet Take 1 tablet (550 mg total) by mouth 3 (three) times daily.  [name] saccharomyces boulardii (FLORASTOR) 250 MG capsule Take 1 capsule (250 mg total) by mouth 2 (two) times daily.  . [DISCONTINUED] diphenoxylate-atropine (LOMOTIL) 2.5-0.025 MG per tablet Take 1 tablet by mouth 4 (four) times daily as needed for diarrhea or loose stools.  . [DISCONTINUED] hydrOXYzine (ATARAX/VISTARIL) 50 MG tablet Take 1 tablet (50 mg total) by mouth every 6 (six) hours. For itching  . [DISCONTINUED] traMADol (ULTRAM) 50 MG tablet Take 1 tablet (50 mg total) by mouth every 6 (six) hours as needed.   No facility-administered encounter medications on file as of 02/14/2015.   Allergies  Allergen Reactions  . Ceftriaxone Sodium Itching  . Doxycycline  Rash   Patient Active Problem List   Diagnosis Date Noted  . Hypercalcemia 07/11/2013  . Sweating 07/05/2013  . Other malaise and fatigue 07/05/2013  . Hypocalcemia 07/05/2013  . Palpitations 07/05/2013  . Low blood pressure reading 07/05/2013  . Pes planus (flat feet) 03/29/2013  . Numbness of toes 03/29/2013  . Toe pain 03/29/2013  . History of PSVT (paroxysmal supraventricular tachycardia) 06/24/2012  . Contact dermatitis 06/23/2012  . Hypokalemia 06/23/2012  . Intertrigo 06/23/2012    . Anemia 06/23/2012  . Hx of cardiac catheterization   . Positive PPD   . Multinodular goiter 05/12/2012  . Chest pain at rest 03/09/2012  . Sinus tachycardia 03/09/2012  . SLEEP APNEA 01/19/2008  . GERD 01/18/2008  . DYSPEPSIA 01/18/2008  . CONSTIPATION 01/18/2008  . IRRITABLE BOWEL SYNDROME 01/18/2008   History   Social History  . Marital Status: Divorced    Spouse Name: N/A  . Number of Children: 2  . Years of Education: 16   Occupational History  . dialysis tech    Social History Main Topics  . Smoking status: Never Smoker   . Smokeless tobacco: Never Used  . Alcohol Use: No  . Drug Use: No  . Sexual Activity: Yes   Other Topics Concern  . 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 She lived in [place] and in [place] for a number of years her mom is from [place]. 16 years of education went to college at [employer] lives at home with her son who is in his 46s no pets   Neg ets tob etoh hx PA   6 hours of sleep   G2P2    TD2010  colonoscopy 2009   Considering own business           [name]'s family history includes Colon cancer in her maternal grandfather; Diabetes in her maternal grandfather and maternal grandmother; Heart disease in her father and maternal grandfather; Hypertension in her maternal grandfather.      Objective:    Filed Vitals:   02/14/15 1009  BP: 122/78  Pulse: 68    Physical Exam  well-developed African-American female in no acute distress, quite pleasant blood pressure 122/78 pulse 68 height 5 foot 6 weight 192, BMI 30. HEENT; nontraumatic normocephalic EOMI PERRLA sclera anicteric, Supple ;no JVD, Cardiovascular; regular rate and rhythm with S1-S2 no murmur rub or gallop, Pulmonary; clear bilaterally, Abdomen ;soft, basically, nontender nondistended bowel sounds are active  there is no palpable mass or hepatosplenomegaly, Rectal; exam not done, Extremities ;no clubbing cyanosis or edema skin warm and dry, Psych; mood and affect appropriate       Assessment & Plan:   #1 54 yo female with IBS- intermittent episodes of diarrhea, abdominal bloating, likely secondary to IBD-D and or bacterial overgrowth. #2 Lactose intolerance #3 adenomatous colon polyps #4 diverticulosis #5 GERD Plan; Lactose avoidance, use Lactaid tablets prn Low gas diet  Start daily probiotic -Florastor daily Will give a trial of Xifaxan 550 mg tid x 14 days Plan follow up colon 08/2017 Follow up with Dr Hilarie Fredrickson or myself as needed- we discussed weight loss which has only been 5-6 pounds this year-multifactorial. She will return if continues to trend down unintentionally.   Amy S Esterwood PA-C 02/14/2015   Cc: Burnis Medin, MD   Addendum:  Reviewed and agree with management. Jerene Bears, MD

## 2015-02-14 NOTE — Patient Instructions (Signed)
We sent prescriptions to [facility].  1. Xifaxan 550 mg 2. Florastor Probioitc  You can get over the counter "Culterelle"  or "Align"  If  You do not get the [employer].  Use Lactaid tablets.  We have given you a low gas diet brochure,

## 2015-03-12 ENCOUNTER — Other Ambulatory Visit: Payer: Self-pay | Admitting: Internal Medicine

## 2015-03-12 ENCOUNTER — Ambulatory Visit: Payer: 59 | Admitting: Internal Medicine

## 2015-03-12 NOTE — Telephone Encounter (Signed)
Sent to the pharmacy by e-scribe. 

## 2015-03-19 ENCOUNTER — Other Ambulatory Visit: Payer: Self-pay

## 2015-03-19 ENCOUNTER — Ambulatory Visit: Payer: 59

## 2015-03-19 DIAGNOSIS — Z1231 Encounter for screening mammogram for malignant neoplasm of breast: Secondary | ICD-10-CM

## 2015-03-20 ENCOUNTER — Ambulatory Visit: Payer: 59 | Admitting: Podiatry

## 2015-04-03 ENCOUNTER — Ambulatory Visit: Payer: Self-pay | Admitting: Podiatry

## 2015-04-08 ENCOUNTER — Telehealth: Payer: Self-pay | Admitting: Gastroenterology

## 2015-04-08 ENCOUNTER — Encounter: Payer: Self-pay | Admitting: Gastroenterology

## 2015-04-08 ENCOUNTER — Other Ambulatory Visit (INDEPENDENT_AMBULATORY_CARE_PROVIDER_SITE_OTHER): Payer: Self-pay

## 2015-04-08 ENCOUNTER — Ambulatory Visit (INDEPENDENT_AMBULATORY_CARE_PROVIDER_SITE_OTHER): Payer: Self-pay | Admitting: Gastroenterology

## 2015-04-08 VITALS — BP 122/72 | HR 71 | Ht 66.25 in | Wt 191.0 lb

## 2015-04-08 DIAGNOSIS — R109 Unspecified abdominal pain: Secondary | ICD-10-CM

## 2015-04-08 LAB — COMPREHENSIVE METABOLIC PANEL
ALK PHOS: 60 U/L (ref 39–117)
ALT: 18 U/L (ref 0–35)
AST: 17 U/L (ref 0–37)
Albumin: 3.9 g/dL (ref 3.5–5.2)
BUN: 13 mg/dL (ref 6–23)
CHLORIDE: 106 meq/L (ref 96–112)
CO2: 31 mEq/L (ref 19–32)
Calcium: 9 mg/dL (ref 8.4–10.5)
Creatinine, Ser: 0.8 mg/dL (ref 0.40–1.20)
GFR: 95.48 mL/min (ref >60.00)
Glucose, Bld: 65 mg/dL — ABNORMAL LOW (ref 70–99)
POTASSIUM: 4 meq/L (ref 3.5–5.1)
SODIUM: 142 meq/L (ref 135–145)
Total Bilirubin: 0.6 mg/dL (ref 0.2–1.2)
Total Protein: 6.6 g/dL (ref 6.0–8.3)

## 2015-04-08 LAB — CBC WITH DIFFERENTIAL/PLATELET
BASOS ABS: 0 10*3/uL (ref 0.0–0.1)
Basophils Relative: 0.2 % (ref 0.0–3.0)
EOS ABS: 0.1 10*3/uL (ref 0.0–0.7)
EOS PCT: 1.3 % (ref 0.0–5.0)
HCT: 41.3 % (ref 36.0–46.0)
Hemoglobin: 12.9 g/dL (ref 12.0–15.0)
LYMPHS ABS: 1.5 10*3/uL (ref 0.7–4.0)
Lymphocytes Relative: 20.4 % (ref 12.0–46.0)
MCHC: 31.1 g/dL (ref 30.0–36.0)
MCV: 76.6 fl — ABNORMAL LOW (ref 78.0–100.0)
MONO ABS: 0.8 10*3/uL (ref 0.1–1.0)
MONOS PCT: 10.2 % (ref 3.0–12.0)
NEUTROS PCT: 67.9 % (ref 43.0–77.0)
Neutro Abs: 5 10*3/uL (ref 1.4–7.7)
PLATELETS: 212 10*3/uL (ref 150.0–400.0)
RBC: 5.4 Mil/uL — ABNORMAL HIGH (ref 3.87–5.11)
RDW: 15.2 % (ref 11.5–15.5)
WBC: 7.4 10*3/uL (ref 4.0–10.5)

## 2015-04-08 NOTE — Telephone Encounter (Signed)
Late entry; She called last night while I was on call:  Burning, warm sensation coming in waves across her abdomen. Has been going on for weeks.  Not painful.  No associated GI symptoms (no nausea, vomiting, abd pain, bowel troubles).  Otherwise feels fine. I recommended she try PPI (she already has some at home) once daily and call if the symptoms persist, worsen.

## 2015-04-08 NOTE — Patient Instructions (Signed)
Your physician has requested that you go to the basement for lab work before leaving today.  Continue taking Protonix 1 tablet 30-60 minutes before dinner.

## 2015-04-08 NOTE — Progress Notes (Addendum)
     04/08/2015 [name] [id number] 01-Dec-1960   History of Present Illness:  This is a 54 year old female who presents to our office today with the complaint of a warm or "heat" sensation on the left side of the abdomen.  She said it is not a pain and is hard to describe, but feels like a warm laser or light is shining inside her abdomen.  Not "burning".  This began a couple of weeks ago and has been persistent.  She denies any nausea, vomiting, changes in bowel habits, diarrhea, fever, and chills.  Her appetite is normal.  She has history of reflux type symptoms in the past and was previously on pantoprazole 40 mg daily.  Only other issue is some hoarseness and frequent clearing of her throat at times.  She called Dr. Ardis Hughs on call last night and he told her to try that and call back if symptoms worse or persist.  She walked in to clinic this morning and wanted to be seen.  Took pantoprazole last night.   Current Medications, Allergies, Past Medical History, Past Surgical History, Family History and Social History were reviewed in [employer] record.   Physical Exam: BP 122/72 mmHg  Pulse 71  Ht 5' 6.25" (1.683 m)  Wt 191 lb (86.637 kg)  BMI 30.59 kg/m2  SpO2 98% General: Well developed black female in no acute distress Head: Normocephalic and atraumatic Eyes:  Sclerae anicteric, conjunctiva pink  Ears: Normal auditory acuity Lungs: Clear throughout to auscultation Heart: Regular rate and rhythm Abdomen: Soft, non-distended.  Normal bowel sounds.  Minimal diffuse TTP without R/R/G. Musculoskeletal: Symmetrical with no gross deformities  Extremities: No edema  Neurological: Alert oriented x 4, grossly non-focal Psychological:  Alert and cooperative. Normal mood and affect  Assessment and Recommendations: -54 year old female with "warm/heat sensation in her left mid-abdomen.  No other associated features and no "pain".  ? If it is acid related but  location is lower than you would expect.  Only other symptoms are occasional hoarseness and frequent throat clearing.  Will check CBC and CMP and will have her restart her pantoprazole 40 mg daily, but otherwise will just observe for now.  She will call back in 2-3 weeks with an update and if it continues then will check a CT scan of the abdomen and pelvis.  Will call back sooner if she develops actual pain or any other symptoms.  Addendum: Reviewed and agree with initial management. Jerene Bears, MD

## 2015-04-14 ENCOUNTER — Telehealth: Payer: Self-pay | Admitting: *Deleted

## 2015-04-14 MED ORDER — RANITIDINE HCL 150 MG PO TABS: 150.0000 mg | ORAL_TABLET | Freq: Two times a day (BID) | ORAL | Status: DC

## 2015-04-14 NOTE — Telephone Encounter (Signed)
Patient advised of Jessica's recommendations to d/c pantoprazole if causing discomfort (please see 04/08/15 lab result notes) and instead start Zantac. She verbalizes understanding. Rx sent to pharmacy.

## 2015-04-14 NOTE — Telephone Encounter (Signed)
-----   Message from Loralie Champagne, PA-C sent at 04/09/2015  8:24 AM EDT ----- Please let the patient know that her labs look great.  No indication of any problems from those results.  Proceed as discussed at her visit yesterday.  Thank you,  Jess

## 2015-04-29 ENCOUNTER — Telehealth: Payer: Self-pay | Admitting: *Deleted

## 2015-04-29 NOTE — Telephone Encounter (Signed)
PLEASE NOTE: All timestamps contained within this report are represented as [place] Standard Time. CONFIDENTIALTY NOTICE: This fax transmission is intended only for the addressee. It contains information that is legally privileged, confidential or otherwise protected from use or disclosure. If you are not the intended recipient, you are strictly prohibited from reviewing, disclosing, copying using or disseminating any of this information or taking any action in reliance on or regarding this information. If you have received this fax in error, please notify us immediately by telephone so that we can arrange for its return to [place]. Phone: [phone], Toll-Free: [phone], Fax: [phone] Page: 1 of 2 Call Id: [id number] [facility] - Client [place] Patient Name: [name] Gender: Female DOB: Mar 14, 1961 Age: 54 Y 10 M 20 D Return Phone Number: [phone] (Primary) Address: [address] City/State/Zip: [place] [address] Client [facility] Night - Client Client Site [facility] - Night Physician Shanon Ace Contact Type Call Call Type Triage / Clinical Relationship To Patient Self Return Phone Number [phone] (Primary) Chief Complaint Unclassified Symptom Initial Comment Caller states has blood coming from navel. PreDisposition Go to Urgent Care/Walk-In Clinic Nurse Assessment Nurse: Markus Daft, RN, Sherre Poot Date/Time Eilene Ghazi Time): 04/26/2015 8:55:54 AM Confirm and document reason for call. If symptomatic, describe symptoms. ---Caller states has blood coming from navel. Noticed last night with shower. There is a still some blood just oozing a little. No fever or lump or wound. Has the patient traveled out of the country within the last 30 days? ---No Does the patient require triage? ---Yes Related visit to physician within the last 2 weeks? ---No Does the  PT have any chronic conditions? (i.e. diabetes, asthma, etc.) ---No Guidelines Guideline Title Affirmed Question Affirmed Notes Nurse Date/Time Eilene Ghazi Time) No Guideline Available - Sick Adult Call Nursing judgment or information in reference no other s/s since last night. Had a warm sensation on the left side of abdomen. She ha discussed that with MD before, and placed on acid reflux meds about 1 & 1/2 wks ago, and has not really helped. Markus Daft, RN, [facility] 04/26/2015 8:58:54 AM Disp. Time Eilene Ghazi Time) Disposition Final User 04/26/2015 9:02:05 AM See PCP When Office is Open (within 3 days) Yes Markus Daft, RN, [facility] Kingfisher Understands: Yes Disagree/Comply: Comply PLEASE NOTE: All timestamps contained within this report are represented as [place] Standard Time. CONFIDENTIALTY NOTICE: This fax transmission is intended only for the addressee. It contains information that is legally privileged, confidential or otherwise protected from use or disclosure. If you are not the intended recipient, you are strictly prohibited from reviewing, disclosing, copying using or disseminating any of this information or taking any action in reliance on or regarding this information. If you have received this fax in error, please notify us immediately by telephone so that we can arrange for its return to [place]. Phone: [phone], Toll-Free: [phone], Fax: [phone] Page: 2 of 2 Call Id: [id number] Care Advice Given Per Guideline SEE PCP WITHIN 3 DAYS: You need to be examined within 2 or 3 days. Call your doctor during regular office hours and make an appointment. (Note: if office will be open tomorrow, tell caller to call then, not in 3 days). CALL BACK IF: Apply pressure to any bleeding for 10 minutes cont., and keep clean and dry and covered. * New symptoms develop * You become worse. CARE ADVICE per nursing judgment or reference (No Guideline Available - Sick Adult Call; Adult) After Care  Instructions Given Call Event Type User Date / Time Description Referrals [facility]

## 2015-07-08 ENCOUNTER — Ambulatory Visit (INDEPENDENT_AMBULATORY_CARE_PROVIDER_SITE_OTHER): Payer: 59 | Admitting: Podiatry

## 2015-07-08 ENCOUNTER — Encounter: Payer: Self-pay | Admitting: Podiatry

## 2015-07-08 ENCOUNTER — Ambulatory Visit: Payer: Self-pay

## 2015-07-08 ENCOUNTER — Ambulatory Visit (INDEPENDENT_AMBULATORY_CARE_PROVIDER_SITE_OTHER): Payer: 59

## 2015-07-08 VITALS — BP 113/59 | HR 71 | Resp 12

## 2015-07-08 DIAGNOSIS — R52 Pain, unspecified: Secondary | ICD-10-CM

## 2015-07-08 DIAGNOSIS — S93601A Unspecified sprain of right foot, initial encounter: Secondary | ICD-10-CM | POA: Diagnosis not present

## 2015-07-08 NOTE — Progress Notes (Signed)
   Subjective:    Patient ID: [name], female    DOB: [birth year], 54 y.o.   MRN: [id number]  HPIThis patient presents to the office saying she is experiencing sharp shooting pain through the outside top of her right foot.  She says the pain is intense and caused significant pain over the weekend.  She now says the pain is occasional and less frequent but she is concerned about her foot.  She gives history of neuroma right forefoot.  She denies trauma or injury.  She presents for evaluation and treatment. Patient is having right foot pain since Saturday.   Review of Systems  All other systems reviewed and are negative.      Objective:   Physical Exam GENERAL APPEARANCE: Alert, conversant. Appropriately groomed. No acute distress.  VASCULAR: Pedal pulses palpable at  [facility] and PT bilateral.  Capillary refill time is immediate to all digits,  Normal temperature gradient.  Digital hair growth is present bilateral  NEUROLOGIC: sensation is normal to 5.07 monofilament at 5/5 sites bilateral.  Light touch is intact bilateral, Muscle strength normal.  MUSCULOSKELETAL: acceptable muscle strength, tone and stability bilateral.  Intrinsic muscluature intact bilateral.  Rectus appearance of foot and digits noted bilateral. Palpable pain in third interspace right foot and along fourth metatarsal shaft.  Pain is noted along the peroneal tendon right foot upon eversion. No evidence of redness or swelling noted. Asymptomatic HAV  B/L.     DERMATOLOGIC: skin color, texture, and turgor are within normal limits.  No preulcerative lesions or ulcers  are seen, no interdigital maceration noted.  No open lesions present.  Digital nails are asymptomatic. No drainage noted.         Assessment & Plan:  Foot Sprain right foot.  Peroneal tendinitis right foot.  ROV>  X-ray taken revealing no bony pathology.  Powersteps prescribed.   To consider orthoses.

## 2015-07-31 ENCOUNTER — Telehealth: Payer: Self-pay | Admitting: Internal Medicine

## 2015-07-31 ENCOUNTER — Ambulatory Visit (INDEPENDENT_AMBULATORY_CARE_PROVIDER_SITE_OTHER): Payer: 59 | Admitting: Internal Medicine

## 2015-07-31 ENCOUNTER — Encounter: Payer: Self-pay | Admitting: Internal Medicine

## 2015-07-31 VITALS — BP 116/84 | Temp 98.2°F | Wt 199.0 lb

## 2015-07-31 DIAGNOSIS — E876 Hypokalemia: Secondary | ICD-10-CM

## 2015-07-31 DIAGNOSIS — M545 Low back pain, unspecified: Secondary | ICD-10-CM

## 2015-07-31 DIAGNOSIS — Z0000 Encounter for general adult medical examination without abnormal findings: Secondary | ICD-10-CM

## 2015-07-31 MED ORDER — POTASSIUM CHLORIDE CRYS ER 20 MEQ PO TBCR: 20.0000 meq | EXTENDED_RELEASE_TABLET | Freq: Two times a day (BID) | ORAL | Status: DC

## 2015-07-31 NOTE — Progress Notes (Signed)
Pre visit review using our clinic review tool, if applicable. No additional management support is needed unless otherwise documented below in the visit note.  Chief Complaint  Patient presents with  . Back Pain  . Neck Pain    HPI: Patient [name]  comes in today for SDA for  Acute problem evaluation. Hx of back pain seen in 2015  And seen ed 3 2016   Remote mri 2008 with l4 l5 facet disease   Now lbp is constant  progressive and now   Always there  Worse with sitting or laying for a while  No fever.    Lays on side  And between   And keeps   On heat pad.    also neck pain secondary  No weakness numbness in  Legs has had pt in past  But this ti worse   Not doing as much of exercises but lifts and uses back at work .  Is a dialysis technician   Lifts at work dialysis   Needs refill potassium  And cpx before end of year  ROS: See pertinent positives and negatives per HPI. No heels except on Sunday no fall  No fever chills weight loss   Past Medical History  Diagnosis Date  . Mitral valve prolapse   . Rapid heart beat     States "irreg heart beat"  . Hemorrhoids   . Anemia   . GERD (gastroesophageal reflux disease)   . Positive PPD   . Genital warts   . Hx of cardiac catheterization 2009    normal coronary arteries  . Hx of menorrhagia     Novasure  . Normal cardiac stress test 03/09/2012    no ischemia  . Hypokalemia   . Thyroid disease     Thyroid nodules  . Hypomagnesemia   . Hypophosphatemia   . Colon polyp 08/28/2012    Tubular adenoma    Family History  Problem Relation Age of Onset  . Heart disease Father     died Mi 68  . Hypertension Maternal Grandfather   . Diabetes Maternal Grandfather   . Heart disease Maternal Grandfather   . Colon cancer Maternal Grandfather   . Diabetes Maternal Grandmother     Social History   Social History  . Marital Status: Divorced    Spouse Name: N/A  . Number of Children: 2  . Years of Education: 16    Occupational History  . dialysis tech    Social History Main Topics  . Smoking status: Never Smoker   . Smokeless tobacco: Never Used  . Alcohol Use: No  . Drug Use: No  . Sexual Activity: Yes   Other Topics Concern  . None   Social History Narrative   Ms. Rehagen is a divorced [place] American female who works as a Engineer, 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 She lived in [place] and in [place] for a number of years her mom is from [place]. 16 years of education went to college at [employer] lives at home with her son who is in his 75s no pets   Neg ets tob etoh hx PA   6 hours of sleep   G2P2    TD2010  colonoscopy 2009   Considering own business           Outpatient Prescriptions Prior to Visit  Medication Sig Dispense Refill  . CALCIUM PO Take by mouth daily.    [name]  cholecalciferol (VITAMIN D) 1000 UNITS tablet Take 2,000 Units by mouth every morning.     . metoprolol (LOPRESSOR) 50 MG tablet Take 25 mg by mouth 2 (two) times daily.  1  . PARoxetine (PAXIL-CR) 25 MG 24 hr tablet Take 25 mg by mouth every morning.     [name] KLOR-CON M20 20 MEQ tablet TAKE 1 TABLET BY MOUTH TWICE A DAY 180 tablet 1  . ranitidine (ZANTAC) 150 MG tablet Take 1 tablet (150 mg total) by mouth 2 (two) times daily. 60 tablet 2   No facility-administered medications prior to visit.     EXAM:  BP 116/84 mmHg  Temp(Src) 98.2 F (36.8 C) (Oral)  Wt 199 lb (90.266 kg)  Body mass index is 31.87 kg/(m^2).  GENERAL: vitals reviewed and listed above, alert, oriented, appears well hydrated and in no acute distress  Some discomfort  HEENT: atraumatic, conjunctiva  clear, no obvious abnormalities on inspection of external nose and ears back tender mid lower back no slr  Toe heel nl strength  Pain some better with flexing  MS: moves all extremities without noticeable focal  Abnormality no atrophy obvious stands with some lordosis  PSYCH: pleasant and  cooperative, no obvious depression or anxiety  Lab Results  Component Value Date   WBC 7.4 04/08/2015   HGB 12.9 04/08/2015   HCT 41.3 04/08/2015   PLT 212.0 04/08/2015   GLUCOSE 65* 04/08/2015   CHOL 126 10/11/2014   TRIG 103.0 10/11/2014   HDL 44.80 10/11/2014   LDLCALC 61 10/11/2014   ALT 18 04/08/2015   AST 17 04/08/2015   NA 142 04/08/2015   K 4.0 04/08/2015   CL 106 04/08/2015   CREATININE 0.80 04/08/2015   BUN 13 04/08/2015   CO2 31 04/08/2015   TSH 0.63 10/11/2014   INR 0.95 03/10/2012   HGBA1C 6.0 09/29/2012    ASSESSMENT AND PLAN:  Discussed the following assessment and plan:  Midline low back pain without sciatica  progressive  - progressive  and at  night rest   now constant  unresponsive to conservative measures alth not doing reg back exercise  - Plan: Ambulatory referral to Orthopedic Surgery  Hypokalemia - hx of this unknown cause  but ok on 40 meq per day potassium last check july refill x 3 motnhs cpx and lab then   -Patient advised to return or notify health care team  if symptoms worsen ,persist or new concerns arise.  Patient Instructions  Will get referral to [facility]o [facility] .    For back and neck pain.  In the interim caution with lifting    Back Exercises The following exercises strengthen the muscles that help to support the back. They also help to keep the lower back flexible. Doing these exercises can help to prevent back pain or lessen existing pain. If you have back pain or discomfort, try doing these exercises 2-3 times each day or as told by your health care provider. When the pain goes away, do them once each day, but increase the number of times that you repeat the steps for each exercise (do more repetitions). If you do not have back pain or discomfort, do these exercises once each day or as told by your health care provider. EXERCISES Single Knee to Chest Repeat these steps 3-5 times for each leg: 1. Lie on your back on a firm  bed or the floor with your legs extended. 2. Bring one knee to your chest. Your other leg  should stay extended and in contact with the floor. 3. Hold your knee in place by grabbing your knee or thigh. 4. Pull on your knee until you feel a gentle stretch in your lower back. 5. Hold the stretch for 10-30 seconds. 6. Slowly release and straighten your leg. Pelvic Tilt Repeat these steps 5-10 times: 1. Lie on your back on a firm bed or the floor with your legs extended. 2. Bend your knees so they are pointing toward the ceiling and your feet are flat on the floor. 3. Tighten your lower abdominal muscles to press your lower back against the floor. This motion will tilt your pelvis so your tailbone points up toward the ceiling instead of pointing to your feet or the floor. 4. With gentle tension and even breathing, hold this position for 5-10 seconds. Cat-Cow Repeat these steps until your lower back becomes more flexible: 1. Get into a hands-and-knees position on a firm surface. Keep your hands under your shoulders, and keep your knees under your hips. You may place padding under your knees for comfort. 2. Let your head hang down, and point your tailbone toward the floor so your lower back becomes rounded like the back of a cat. 3. Hold this position for 5 seconds. 4. Slowly lift your head and point your tailbone up toward the ceiling so your back forms a sagging arch like the back of a cow. 5. Hold this position for 5 seconds. Press-Ups Repeat these steps 5-10 times: 1. Lie on your abdomen (face-down) on the floor. 2. Place your palms near your head, about shoulder-width apart. 3. While you keep your back as relaxed as possible and keep your hips on the floor, slowly straighten your arms to raise the top half of your body and lift your shoulders. Do not use your back muscles to raise your upper torso. You may adjust the placement of your hands to make yourself more comfortable. 4. Hold this  position for 5 seconds while you keep your back relaxed. 5. Slowly return to lying flat on the floor. Bridges Repeat these steps 10 times: 1. Lie on your back on a firm surface. 2. Bend your knees so they are pointing toward the ceiling and your feet are flat on the floor. 3. Tighten your buttocks muscles and lift your buttocks off of the floor until your waist is at almost the same height as your knees. You should feel the muscles working in your buttocks and the back of your thighs. If you do not feel these muscles, slide your feet 1-2 inches farther away from your buttocks. 4. Hold this position for 3-5 seconds. 5. Slowly lower your hips to the starting position, and allow your buttocks muscles to relax completely. If this exercise is too easy, try doing it with your arms crossed over your chest. Abdominal Crunches Repeat these steps 5-10 times: 1. Lie on your back on a firm bed or the floor with your legs extended. 2. Bend your knees so they are pointing toward the ceiling and your feet are flat on the floor. 3. Cross your arms over your chest. 4. Tip your chin slightly toward your chest without bending your neck. 5. Tighten your abdominal muscles and slowly raise your trunk (torso) high enough to lift your shoulder blades a tiny bit off of the floor. Avoid raising your torso higher than that, because it can put too much stress on your low back and it does not help to strengthen your abdominal muscles.  6. Slowly return to your starting position. Back Lifts Repeat these steps 5-10 times: 1. Lie on your abdomen (face-down) with your arms at your sides, and rest your forehead on the floor. 2. Tighten the muscles in your legs and your buttocks. 3. Slowly lift your chest off of the floor while you keep your hips pressed to the floor. Keep the back of your head in line with the curve in your back. Your eyes should be looking at the floor. 4. Hold this position for 3-5 seconds. 5. Slowly return  to your starting position. SEEK MEDICAL CARE IF:  Your back pain or discomfort gets much worse when you do an exercise.  Your back pain or discomfort does not lessen within 2 hours after you exercise. If you have any of these problems, stop doing these exercises right away. Do not do them again unless your health care provider says that you can. SEEK IMMEDIATE MEDICAL CARE IF:  You develop sudden, severe back pain. If this happens, stop doing the exercises right away. Do not do them again unless your health care provider says that you can.   This information is not intended to replace advice given to you by your health care provider. Make sure you discuss any questions you have with your health care provider.   Document Released: 10/07/2004 Document Revised: 05/21/2015 Document Reviewed: 10/24/2014 Elsevier Interactive Patient Education 2016 [place]. Khalil Belote M.D.  See mri report in 2008

## 2015-07-31 NOTE — Patient Instructions (Signed)
Will get referral to ortho [facility] .    For back and neck pain.  In the interim caution with lifting    Back Exercises The following exercises strengthen the muscles that help to support the back. They also help to keep the lower back flexible. Doing these exercises can help to prevent back pain or lessen existing pain. If you have back pain or discomfort, try doing these exercises 2-3 times each day or as told by your health care provider. When the pain goes away, do them once each day, but increase the number of times that you repeat the steps for each exercise (do more repetitions). If you do not have back pain or discomfort, do these exercises once each day or as told by your health care provider. EXERCISES Single Knee to Chest Repeat these steps 3-5 times for each leg: 1. Lie on your back on a firm bed or the floor with your legs extended. 2. Bring one knee to your chest. Your other leg should stay extended and in contact with the floor. 3. Hold your knee in place by grabbing your knee or thigh. 4. Pull on your knee until you feel a gentle stretch in your lower back. 5. Hold the stretch for 10-30 seconds. 6. Slowly release and straighten your leg. Pelvic Tilt Repeat these steps 5-10 times: 1. Lie on your back on a firm bed or the floor with your legs extended. 2. Bend your knees so they are pointing toward the ceiling and your feet are flat on the floor. 3. Tighten your lower abdominal muscles to press your lower back against the floor. This motion will tilt your pelvis so your tailbone points up toward the ceiling instead of pointing to your feet or the floor. 4. With gentle tension and even breathing, hold this position for 5-10 seconds. Cat-Cow Repeat these steps until your lower back becomes more flexible: 1. Get into a hands-and-knees position on a firm surface. Keep your hands under your shoulders, and keep your knees under your hips. You may place padding under your knees  for comfort. 2. Let your head hang down, and point your tailbone toward the floor so your lower back becomes rounded like the back of a cat. 3. Hold this position for 5 seconds. 4. Slowly lift your head and point your tailbone up toward the ceiling so your back forms a sagging arch like the back of a cow. 5. Hold this position for 5 seconds. Press-Ups Repeat these steps 5-10 times: 1. Lie on your abdomen (face-down) on the floor. 2. Place your palms near your head, about shoulder-width apart. 3. While you keep your back as relaxed as possible and keep your hips on the floor, slowly straighten your arms to raise the top half of your body and lift your shoulders. Do not use your back muscles to raise your upper torso. You may adjust the placement of your hands to make yourself more comfortable. 4. Hold this position for 5 seconds while you keep your back relaxed. 5. Slowly return to lying flat on the floor. Bridges Repeat these steps 10 times: 1. Lie on your back on a firm surface. 2. Bend your knees so they are pointing toward the ceiling and your feet are flat on the floor. 3. Tighten your buttocks muscles and lift your buttocks off of the floor until your waist is at almost the same height as your knees. You should feel the muscles working in your buttocks and the back  of your thighs. If you do not feel these muscles, slide your feet 1-2 inches farther away from your buttocks. 4. Hold this position for 3-5 seconds. 5. Slowly lower your hips to the starting position, and allow your buttocks muscles to relax completely. If this exercise is too easy, try doing it with your arms crossed over your chest. Abdominal Crunches Repeat these steps 5-10 times: 1. Lie on your back on a firm bed or the floor with your legs extended. 2. Bend your knees so they are pointing toward the ceiling and your feet are flat on the floor. 3. Cross your arms over your chest. 4. Tip your chin slightly toward your  chest without bending your neck. 5. Tighten your abdominal muscles and slowly raise your trunk (torso) high enough to lift your shoulder blades a tiny bit off of the floor. Avoid raising your torso higher than that, because it can put too much stress on your low back and it does not help to strengthen your abdominal muscles. 6. Slowly return to your starting position. Back Lifts Repeat these steps 5-10 times: 1. Lie on your abdomen (face-down) with your arms at your sides, and rest your forehead on the floor. 2. Tighten the muscles in your legs and your buttocks. 3. Slowly lift your chest off of the floor while you keep your hips pressed to the floor. Keep the back of your head in line with the curve in your back. Your eyes should be looking at the floor. 4. Hold this position for 3-5 seconds. 5. Slowly return to your starting position. SEEK MEDICAL CARE IF:  Your back pain or discomfort gets much worse when you do an exercise.  Your back pain or discomfort does not lessen within 2 hours after you exercise. If you have any of these problems, stop doing these exercises right away. Do not do them again unless your health care provider says that you can. SEEK IMMEDIATE MEDICAL CARE IF:  You develop sudden, severe back pain. If this happens, stop doing the exercises right away. Do not do them again unless your health care provider says that you can.   This information is not intended to replace advice given to you by your health care provider. Make sure you discuss any questions you have with your health care provider.   Document Released: 10/07/2004 Document Revised: 05/21/2015 Document Reviewed: 10/24/2014 Elsevier Interactive Patient Education [employer].

## 2015-07-31 NOTE — Telephone Encounter (Signed)
Pt want a order sent to [facility] to do her physical Labs  On 08/14/15 .

## 2015-08-05 NOTE — Telephone Encounter (Signed)
Does pt need another lipase this year?  Any other lab work?

## 2015-08-06 NOTE — Telephone Encounter (Signed)
CPX labs  Plus magnesium  (Dx hypokalemia ) Also do Urinalysis with micro   No need to do lipase

## 2015-08-06 NOTE — Telephone Encounter (Signed)
Orders placed in the system.

## 2015-08-19 ENCOUNTER — Other Ambulatory Visit (INDEPENDENT_AMBULATORY_CARE_PROVIDER_SITE_OTHER): Payer: 59

## 2015-08-19 ENCOUNTER — Telehealth: Payer: Self-pay | Admitting: Internal Medicine

## 2015-08-19 DIAGNOSIS — Z0000 Encounter for general adult medical examination without abnormal findings: Secondary | ICD-10-CM | POA: Diagnosis not present

## 2015-08-19 DIAGNOSIS — E876 Hypokalemia: Secondary | ICD-10-CM | POA: Diagnosis not present

## 2015-08-19 LAB — URINALYSIS, ROUTINE W REFLEX MICROSCOPIC
BILIRUBIN URINE: NEGATIVE
HGB URINE DIPSTICK: NEGATIVE
KETONES UR: NEGATIVE
LEUKOCYTES UA: NEGATIVE
NITRITE: NEGATIVE
RBC / HPF: NONE SEEN (ref 0–?)
Specific Gravity, Urine: 1.025 (ref 1.000–1.030)
Total Protein, Urine: NEGATIVE
URINE GLUCOSE: NEGATIVE
Urobilinogen, UA: 0.2 (ref 0.0–1.0)
WBC, UA: NONE SEEN (ref 0–?)
pH: 5.5 (ref 5.0–8.0)

## 2015-08-19 LAB — CBC WITH DIFFERENTIAL/PLATELET
BASOS ABS: 0 10*3/uL (ref 0.0–0.1)
Basophils Relative: 0.3 % (ref 0.0–3.0)
Eosinophils Absolute: 0.1 10*3/uL (ref 0.0–0.7)
Eosinophils Relative: 1.8 % (ref 0.0–5.0)
HCT: 42.3 % (ref 36.0–46.0)
Hemoglobin: 13.2 g/dL (ref 12.0–15.0)
LYMPHS ABS: 1.3 10*3/uL (ref 0.7–4.0)
LYMPHS PCT: 25.2 % (ref 12.0–46.0)
MCHC: 31.2 g/dL (ref 30.0–36.0)
MCV: 76.7 fl — ABNORMAL LOW (ref 78.0–100.0)
Monocytes Absolute: 0.6 10*3/uL (ref 0.1–1.0)
Monocytes Relative: 12.4 % — ABNORMAL HIGH (ref 3.0–12.0)
NEUTROS PCT: 60.3 % (ref 43.0–77.0)
Neutro Abs: 3.1 10*3/uL (ref 1.4–7.7)
Platelets: 228 10*3/uL (ref 150.0–400.0)
RBC: 5.52 Mil/uL — AB (ref 3.87–5.11)
RDW: 13.9 % (ref 11.5–15.5)
WBC: 5.2 10*3/uL (ref 4.0–10.5)

## 2015-08-19 LAB — HEPATIC FUNCTION PANEL
ALBUMIN: 3.9 g/dL (ref 3.5–5.2)
ALK PHOS: 56 U/L (ref 39–117)
ALT: 18 U/L (ref 0–35)
AST: 18 U/L (ref 0–37)
Bilirubin, Direct: 0.1 mg/dL (ref 0.0–0.3)
Total Bilirubin: 0.4 mg/dL (ref 0.2–1.2)
Total Protein: 6.7 g/dL (ref 6.0–8.3)

## 2015-08-19 LAB — LIPID PANEL
CHOL/HDL RATIO: 3
Cholesterol: 129 mg/dL (ref 0–200)
HDL: 46.2 mg/dL (ref >39.00)
LDL Cholesterol: 71 mg/dL (ref 0–99)
NonHDL: 82.6
TRIGLYCERIDES: 59 mg/dL (ref 0.0–149.0)
VLDL: 11.8 mg/dL (ref 0.0–40.0)

## 2015-08-19 LAB — BASIC METABOLIC PANEL
BUN: 9 mg/dL (ref 6–23)
CALCIUM: 9.2 mg/dL (ref 8.4–10.5)
CO2: 31 mEq/L (ref 19–32)
Chloride: 107 mEq/L (ref 96–112)
Creatinine, Ser: 0.73 mg/dL (ref 0.40–1.20)
GFR: 105.98 mL/min (ref >60.00)
Glucose, Bld: 87 mg/dL (ref 70–99)
Potassium: 4.1 mEq/L (ref 3.5–5.1)
Sodium: 142 mEq/L (ref 135–145)

## 2015-08-19 LAB — MAGNESIUM: Magnesium: 1.9 mg/dL (ref 1.5–2.5)

## 2015-08-19 LAB — TSH: TSH: 0.66 u[IU]/mL (ref 0.35–4.50)

## 2015-08-19 LAB — HEMOGLOBIN A1C: Hgb A1c MFr Bld: 5.9 % (ref 4.6–6.5)

## 2015-08-19 NOTE — Telephone Encounter (Signed)
Orders were placed for the [facility] office

## 2015-08-19 NOTE — Telephone Encounter (Signed)
[name] needs orders put in for her urinalysis and her A1C

## 2015-08-19 NOTE — Addendum Note (Signed)
Addended by: Netta Neat D on: 08/19/2015 02:32 PM   Modules accepted: Orders

## 2015-08-21 ENCOUNTER — Ambulatory Visit (INDEPENDENT_AMBULATORY_CARE_PROVIDER_SITE_OTHER): Payer: 59 | Admitting: Internal Medicine

## 2015-08-21 ENCOUNTER — Encounter: Payer: Self-pay | Admitting: Internal Medicine

## 2015-08-21 VITALS — BP 130/80 | Temp 97.5°F | Ht 66.0 in | Wt 196.8 lb

## 2015-08-21 DIAGNOSIS — Z0000 Encounter for general adult medical examination without abnormal findings: Secondary | ICD-10-CM | POA: Diagnosis not present

## 2015-08-21 DIAGNOSIS — Z79899 Other long term (current) drug therapy: Secondary | ICD-10-CM | POA: Diagnosis not present

## 2015-08-21 DIAGNOSIS — E876 Hypokalemia: Secondary | ICD-10-CM

## 2015-08-21 MED ORDER — POTASSIUM CHLORIDE CRYS ER 20 MEQ PO TBCR: 20.0000 meq | EXTENDED_RELEASE_TABLET | Freq: Two times a day (BID) | ORAL | Status: DC

## 2015-08-21 NOTE — Progress Notes (Signed)
Pre visit review using our clinic review tool, if applicable. No additional management support is needed unless otherwise documented below in the visit note.  Chief Complaint  Patient presents with  . Annual Exam    HPI: Patient  [name]  54 y.o. comes in today for Preventive Health Care visit   Health Maintenance  Topic Date Due  . INFLUENZA VACCINE  02/11/2016 (Originally 04/14/2015)  . Hepatitis C Screening  08/20/2016 (Originally 1961-05-06)  . HIV Screening  08/20/2016 (Originally 06/06/1974)  . MAMMOGRAM  02/02/2016  . PAP SMEAR  09/13/2016  . COLONOSCOPY  08/22/2017  . 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 anxitey good on paxil  GEN/ HEENT: No fever, significant weight changes sweats headaches vision problems hearing changes, CV/ PULM; No chest pain shortness of breath cough, syncope,edema  change in exercise tolerance. GI /GU: No adominal pain, vomiting, change in bowel habits. No blood in the stool. No significant GU symptoms. SKIN/HEME: ,no acute skin rashes suspicious lesions or bleeding. No lymphadenopathy, nodules, masses.  NEURO/ PSYCH:  No neurologic signs such as weakness numbness. No depression anxiety. IMM/ Allergy: No unusual infections.  Allergy .   REST of 12 system review negative except as per HPI   Past Medical History  Diagnosis Date  . Mitral valve prolapse   . Rapid heart beat     States "irreg heart beat"  . Hemorrhoids   . Anemia   . GERD (gastroesophageal reflux disease)   . Positive PPD   . Genital warts   . Hx of cardiac catheterization 2009    normal coronary arteries  . Hx of menorrhagia     Novasure  . Normal cardiac stress test 03/09/2012    no ischemia  . Hypokalemia   . Thyroid disease      Thyroid nodules  . Hypomagnesemia   . Hypophosphatemia   . Colon polyp 08/28/2012    Tubular adenoma  . Lactose intolerance in adult     Past Surgical History  Procedure Laterality Date  . Novasure ablation    . Upper gastrointestinal endoscopy  May 2013  . Cardiac catheterization      x2 with normal results per pt    Family History  Problem Relation Age of Onset  . Heart disease Father     died Mi 22  . Hypertension Maternal Grandfather   . Diabetes Maternal Grandfather   . Heart disease Maternal Grandfather   . Colon cancer Maternal Grandfather   . Diabetes Maternal Grandmother     Social History   Social History  . Marital Status: Divorced    Spouse Name: N/A  . Number of Children: 2  . Years of Education: 16   Occupational History  . dialysis tech    Social History Main Topics  . Smoking status: Never Smoker   . Smokeless tobacco: Never Used  . Alcohol Use: No  . Drug Use: No  . Sexual Activity: Yes   Other Topics Concern  . 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 She lived in [place] and in [place] for a  number of years her mom is from [facility]. 16 years of education went to college at [employer] lives at home with her son who is in his 69s no pets   Neg ets tob etoh hx PA   6 hours of sleep   G2P2    TD2010  colonoscopy 2009   Now running  own business    [facility]           Outpatient Prescriptions Prior to Visit  Medication Sig Dispense Refill  . CALCIUM PO Take by mouth daily.    . cholecalciferol (VITAMIN D) 1000 UNITS tablet Take 2,000 Units by mouth every morning.     . metoprolol (LOPRESSOR) 50 MG tablet Take 25 mg by mouth 2 (two) times daily.  1  . PARoxetine (PAXIL-CR) 25 MG 24 hr tablet Take 25 mg by mouth every morning.     . potassium chloride SA (KLOR-CON M20) 20 MEQ tablet  Take 1 tablet (20 mEq total) by mouth 2 (two) times daily. 180 tablet 0  . potassium chloride SA (KLOR-CON M20) 20 MEQ tablet Take 1 tablet (20 mEq total) by mouth 2 (two) times daily. 180 tablet 0   No facility-administered medications prior to visit.     EXAM:  BP 130/80 mmHg  Temp(Src) 97.5 F (36.4 C) (Oral)  Ht 5' 6" (1.676 m)  Wt 196 lb 12.8 oz (89.268 kg)  BMI 31.78 kg/m2  Body mass index is 31.78 kg/(m^2).  Physical Exam: Vital signs reviewed ONG:EXBM is a well-developed well-nourished alert cooperative    who appearsr stated age in no acute distress.  HEENT: normocephalic atraumatic , Eyes: PERRL EOM's full, conjunctiva clear, Nares: paten,t no deformity discharge or tenderness., Ears: no deformity EAC's clear TMs with normal landmarks. Mouth: clear OP, no lesions, edema.  Moist mucous membranes. Dentition in adequate repair. NECK: supple without masses, thyroid easily palapable or bruits. CHEST/PULM:  Clear to auscultation and percussion breath sounds equal no wheeze , rales or rhonchi. No chest wall deformities or tenderness.except right upper breast no masses  Breast: normal by inspection . No dimpling, discharge, masses, tenderness or discharge . CV: PMI is nondisplaced, S1 S2 no gallops, murmurs, rubs. Peripheral pulses are full without delay.No JVD .  ABDOMEN: Bowel sounds normal nontender  No guard or rebound, no hepato splenomegal no CVA tenderness.  No hernia. Extremtities:  No clubbing cyanosis or edema, no acute joint swelling or redness no focal atrophy NEURO:  Oriented x3, cranial nerves 3-12 appear to be intact, no obvious focal weakness,gait within normal limits no abnormal reflexes or asymmetrical SKIN: No acute rashes normal turgor, color, no bruising or petechiae. PSYCH: Oriented, good eye contact, no obvious depression anxiety, cognition and judgment appear normal. LN: no cervical axillary inguinal adenopathy  Lab Results  Component Value Date   WBC 5.2  08/19/2015   HGB 13.2 08/19/2015   HCT 42.3 08/19/2015   PLT 228.0 08/19/2015   GLUCOSE 87 08/19/2015   CHOL 129 08/19/2015   TRIG 59.0 08/19/2015   HDL 46.20 08/19/2015   LDLCALC 71 08/19/2015   ALT 18 08/19/2015   AST 18 08/19/2015   NA 142 08/19/2015   K 4.1 08/19/2015   CL 107 08/19/2015   CREATININE 0.73 08/19/2015   BUN 9 08/19/2015   CO2 31 08/19/2015   TSH 0.66 08/19/2015   INR 0.95 03/10/2012   HGBA1C 5.9 08/19/2015   BP Readings from Last 3 Encounters:  08/21/15 130/80  07/31/15 116/84  07/08/15  113/59   Wt Readings from Last 3 Encounters:  08/21/15 196 lb 12.8 oz (89.268 kg)  07/31/15 199 lb (90.266 kg)  04/08/15 191 lb (86.637 kg)    ASSESSMENT AND PLAN:  Discussed the following assessment and plan:  Visit for preventive health examination  Medication management  Patient Care Team: Burnis Medin, MD as PCP - General (Internal Medicine) Dixie Dials, MD as Attending Physician (Internal Medicine) kelly Vigil Paula Compton, MD (Obstetrics and Gynecology) Patient Instructions  Continue lifestyle intervention healthy eating and exercise . Get some exercise walking is good for destress and heart health .  Get mammogram yearly cause of dense breast issue  Consider  3 d  May be more accurate   Wellness visit in a year with labs Continue with current medications  Yearly blood work   If feeling needed can do potassium level in  The interim .   Health Maintenance, Female Adopting a healthy lifestyle and getting preventive care can go a long way to promote health and wellness. Talk with your health care provider about what schedule of regular examinations is right for you. This is a good chance for you to check in with your provider about disease prevention and staying healthy. In between checkups, there are plenty of things you can do on your own. Experts have done a lot of research about which lifestyle changes and preventive measures are most likely to  keep you healthy. Ask your health care provider for more information. WEIGHT AND DIET  Eat a healthy diet  Be sure to include plenty of vegetables, fruits, low-fat dairy products, and lean protein.  Do not eat a lot of foods high in solid fats, added sugars, or salt.  Get regular exercise. This is one of the most important things you can do for your health.  Most adults should exercise for at least 150 minutes each week. The exercise should increase your heart rate and make you sweat (moderate-intensity exercise).  Most adults should also do strengthening exercises at least twice a week. This is in addition to the moderate-intensity exercise.  Maintain a healthy weight  Body mass index (BMI) is a measurement that can be used to identify possible weight problems. It estimates body fat based on height and weight. Your health care provider can help determine your BMI and help you achieve or maintain a healthy weight.  For females 36 years of age and older:   A BMI below 18.5 is considered underweight.  A BMI of 18.5 to 24.9 is normal.  A BMI of 25 to 29.9 is considered overweight.  A BMI of 30 and above is considered obese.  Watch levels of cholesterol and blood lipids  You should start having your blood tested for lipids and cholesterol at 56 years of age, then have this test every 5 years.  You may need to have your cholesterol levels checked more often if:  Your lipid or cholesterol levels are high.  You are older than 56 years of age.  You are at high risk for heart disease.  CANCER SCREENING   Lung Cancer  Lung cancer screening is recommended for adults 44-26 years old who are at high risk for lung cancer because of a history of smoking.  A yearly low-dose CT scan of the lungs is recommended for people who:  Currently smoke.  Have quit within the past 15 years.  Have at least a 30-pack-year history of smoking. A pack year is smoking an average of  one pack of  cigarettes a day for 1 year.  Yearly screening should continue until it has been 15 years since you quit.  Yearly screening should stop if you develop a health problem that would prevent you from having lung cancer treatment.  Breast Cancer  Practice breast self-awareness. This means understanding how your breasts normally appear and feel.  It also means doing regular breast self-exams. Let your health care provider know about any changes, no matter how small.  If you are in your 20s or 30s, you should have a clinical breast exam (CBE) by a health care provider every 1-3 years as part of a regular health exam.  If you are 34 or older, have a CBE every year. Also consider having a breast X-ray (mammogram) every year.  If you have a family history of breast cancer, talk to your health care provider about genetic screening.  If you are at high risk for breast cancer, talk to your health care provider about having an MRI and a mammogram every year.  Breast cancer gene (BRCA) assessment is recommended for women who have family members with BRCA-related cancers. BRCA-related cancers include:  Breast.  Ovarian.  Tubal.  Peritoneal cancers.  Results of the assessment will determine the need for genetic counseling and BRCA1 and BRCA2 testing. Cervical Cancer Your health care provider may recommend that you be screened regularly for cancer of the pelvic organs (ovaries, uterus, and vagina). This screening involves a pelvic examination, including checking for microscopic changes to the surface of your cervix (Pap test). You may be encouraged to have this screening done every 3 years, beginning at age 56.  For women ages 3-65, health care providers may recommend pelvic exams and Pap testing every 3 years, or they may recommend the Pap and pelvic exam, combined with testing for human papilloma virus (HPV), every 5 years. Some types of HPV increase your risk of cervical cancer. Testing for HPV  may also be done on women of any age with unclear Pap test results.  Other health care providers may not recommend any screening for nonpregnant women who are considered low risk for pelvic cancer and who do not have symptoms. Ask your health care provider if a screening pelvic exam is right for you.  If you have had past treatment for cervical cancer or a condition that could lead to cancer, you need Pap tests and screening for cancer for at least 20 years after your treatment. If Pap tests have been discontinued, your risk factors (such as having a new sexual partner) need to be reassessed to determine if screening should resume. Some women have medical problems that increase the chance of getting cervical cancer. In these cases, your health care provider may recommend more frequent screening and Pap tests. Colorectal Cancer  This type of cancer can be detected and often prevented.  Routine colorectal cancer screening usually begins at 56 years of age and continues through 56 years of age.  Your health care provider may recommend screening at an earlier age if you have risk factors for colon cancer.  Your health care provider may also recommend using home test kits to check for hidden blood in the stool.  A small camera at the end of a tube can be used to examine your colon directly (sigmoidoscopy or colonoscopy). This is done to check for the earliest forms of colorectal cancer.  Routine screening usually begins at age 56.  Direct examination of the colon should be repeated  every 5-10 years through 56 years of age. However, you may need to be screened more often if early forms of precancerous polyps or small growths are found. Skin Cancer  Check your skin from head to toe regularly.  Tell your health care provider about any new moles or changes in moles, especially if there is a change in a mole's shape or color.  Also tell your health care provider if you have a mole that is larger than  the size of a pencil eraser.  Always use sunscreen. Apply sunscreen liberally and repeatedly throughout the day.  Protect yourself by wearing long sleeves, pants, a wide-brimmed hat, and sunglasses whenever you are outside. HEART DISEASE, DIABETES, AND HIGH BLOOD PRESSURE   High blood pressure causes heart disease and increases the risk of stroke. High blood pressure is more likely to develop in:  People who have blood pressure in the high end of the normal range (130-139/85-89 mm Hg).  People who are overweight or obese.  People who are African American.  If you are 23-75 years of age, have your blood pressure checked every 3-5 years. If you are 7 years of age or older, have your blood pressure checked every year. You should have your blood pressure measured twice--once when you are at a hospital or clinic, and once when you are not at a hospital or clinic. Record the average of the two measurements. To check your blood pressure when you are not at a hospital or clinic, you can use:  An automated blood pressure machine at a pharmacy.  A home blood pressure monitor.  If you are between 54 years and 24 years old, ask your health care provider if you should take aspirin to prevent strokes.  Have regular diabetes screenings. This involves taking a blood sample to check your fasting blood sugar level.  If you are at a normal weight and have a low risk for diabetes, have this test once every three years after 56 years of age.  If you are overweight and have a high risk for diabetes, consider being tested at a younger age or more often. PREVENTING INFECTION  Hepatitis B  If you have a higher risk for hepatitis B, you should be screened for this virus. You are considered at high risk for hepatitis B if:  You were born in a country where hepatitis B is common. Ask your health care provider which countries are considered high risk.  Your parents were born in a high-risk country, and you  have not been immunized against hepatitis B (hepatitis B vaccine).  You have HIV or AIDS.  You use needles to inject street drugs.  You live with someone who has hepatitis B.  You have had sex with someone who has hepatitis B.  You get hemodialysis treatment.  You take certain medicines for conditions, including cancer, organ transplantation, and autoimmune conditions. Hepatitis C  Blood testing is recommended for:  Everyone born from [birth year] through 1967.  Anyone with known risk factors for hepatitis C. Sexually transmitted infections (STIs)  You should be screened for sexually transmitted infections (STIs) including gonorrhea and chlamydia if:  You are sexually active and are younger than 56 years of age.  You are older than 56 years of age and your health care provider tells you that you are at risk for this type of infection.  Your sexual activity has changed since you were last screened and you are at an increased risk for chlamydia or gonorrhea.  Ask your health care provider if you are at risk.  If you do not have HIV, but are at risk, it may be recommended that you take a prescription medicine daily to prevent HIV infection. This is called pre-exposure prophylaxis (PrEP). You are considered at risk if:  You are sexually active and do not regularly use condoms or know the HIV status of your partner(s).  You take drugs by injection.  You are sexually active with a partner who has HIV. Talk with your health care provider about whether you are at high risk of being infected with HIV. If you choose to begin PrEP, you should first be tested for HIV. You should then be tested every 3 months for as long as you are taking PrEP.  PREGNANCY   If you are premenopausal and you may become pregnant, ask your health care provider about preconception counseling.  If you may become pregnant, take 400 to 800 micrograms (mcg) of folic acid every day.  If you want to prevent pregnancy,  talk to your health care provider about birth control (contraception). OSTEOPOROSIS AND MENOPAUSE   Osteoporosis is a disease in which the bones lose minerals and strength with aging. This can result in serious bone fractures. Your risk for osteoporosis can be identified using a bone density scan.  If you are 40 years of age or older, or if you are at risk for osteoporosis and fractures, ask your health care provider if you should be screened.  Ask your health care provider whether you should take a calcium or vitamin D supplement to lower your risk for osteoporosis.  Menopause may have certain physical symptoms and risks.  Hormone replacement therapy may reduce some of these symptoms and risks. Talk to your health care provider about whether hormone replacement therapy is right for you.  HOME CARE INSTRUCTIONS   Schedule regular health, dental, and eye exams.  Stay current with your immunizations.   Do not use any tobacco products including cigarettes, chewing tobacco, or electronic cigarettes.  If you are pregnant, do not drink alcohol.  If you are breastfeeding, limit how much and how often you drink alcohol.  Limit alcohol intake to no more than 1 drink per day for nonpregnant women. One drink equals 12 ounces of beer, 5 ounces of wine, or 1 ounces of hard liquor.  Do not use street drugs.  Do not share needles.  Ask your health care provider for help if you need support or information about quitting drugs.  Tell your health care provider if you often feel depressed.  Tell your health care provider if you have ever been abused or do not feel safe at home.   This information is not intended to replace advice given to you by your health care provider. Make sure you discuss any questions you have with your health care provider.   Document Released: 03/15/2011 Document Revised: 09/20/2014 Document Reviewed: 08/01/2013 Elsevier Interactive Patient Education 2016 Henagar K. Panosh M.D.

## 2015-08-21 NOTE — Patient Instructions (Addendum)
Continue lifestyle intervention healthy eating and exercise . Get some exercise walking is good for destress and heart health .  Get mammogram yearly cause of dense breast issue  Consider  3 d  May be more accurate   Wellness visit in a year with labs Continue with current medications  Yearly blood work   If feeling needed can do potassium level in  The interim .   Health Maintenance, Female Adopting a healthy lifestyle and getting preventive care can go a long way to promote health and wellness. Talk with your health care provider about what schedule of regular examinations is right for you. This is a good chance for you to check in with your provider about disease prevention and staying healthy. In between checkups, there are plenty of things you can do on your own. Experts have done a lot of research about which lifestyle changes and preventive measures are most likely to keep you healthy. Ask your health care provider for more information. WEIGHT AND DIET  Eat a healthy diet  Be sure to include plenty of vegetables, fruits, low-fat dairy products, and lean protein.  Do not eat a lot of foods high in solid fats, added sugars, or salt.  Get regular exercise. This is one of the most important things you can do for your health.  Most adults should exercise for at least 150 minutes each week. The exercise should increase your heart rate and make you sweat (moderate-intensity exercise).  Most adults should also do strengthening exercises at least twice a week. This is in addition to the moderate-intensity exercise.  Maintain a healthy weight  Body mass index (BMI) is a measurement that can be used to identify possible weight problems. It estimates body fat based on height and weight. Your health care provider can help determine your BMI and help you achieve or maintain a healthy weight.  For females 23 years of age and older:   A BMI below 18.5 is considered underweight.  A BMI of  18.5 to 24.9 is normal.  A BMI of 25 to 29.9 is considered overweight.  A BMI of 30 and above is considered obese.  Watch levels of cholesterol and blood lipids  You should start having your blood tested for lipids and cholesterol at 56 years of age, then have this test every 5 years.  You may need to have your cholesterol levels checked more often if:  Your lipid or cholesterol levels are high.  You are older than 56 years of age.  You are at high risk for heart disease.  CANCER SCREENING   Lung Cancer  Lung cancer screening is recommended for adults 67-108 years old who are at high risk for lung cancer because of a history of smoking.  A yearly low-dose CT scan of the lungs is recommended for people who:  Currently smoke.  Have quit within the past 15 years.  Have at least a 30-pack-year history of smoking. A pack year is smoking an average of one pack of cigarettes a day for 1 year.  Yearly screening should continue until it has been 15 years since you quit.  Yearly screening should stop if you develop a health problem that would prevent you from having lung cancer treatment.  Breast Cancer  Practice breast self-awareness. This means understanding how your breasts normally appear and feel.  It also means doing regular breast self-exams. Let your health care provider know about any changes, no matter how small.  If you  are in your 20s or 30s, you should have a clinical breast exam (CBE) by a health care provider every 1-3 years as part of a regular health exam.  If you are 7 or older, have a CBE every year. Also consider having a breast X-ray (mammogram) every year.  If you have a family history of breast cancer, talk to your health care provider about genetic screening.  If you are at high risk for breast cancer, talk to your health care provider about having an MRI and a mammogram every year.  Breast cancer gene (BRCA) assessment is recommended for women who  have family members with BRCA-related cancers. BRCA-related cancers include:  Breast.  Ovarian.  Tubal.  Peritoneal cancers.  Results of the assessment will determine the need for genetic counseling and BRCA1 and BRCA2 testing. Cervical Cancer Your health care provider may recommend that you be screened regularly for cancer of the pelvic organs (ovaries, uterus, and vagina). This screening involves a pelvic examination, including checking for microscopic changes to the surface of your cervix (Pap test). You may be encouraged to have this screening done every 3 years, beginning at age 62.  For women ages 46-65, health care providers may recommend pelvic exams and Pap testing every 3 years, or they may recommend the Pap and pelvic exam, combined with testing for human papilloma virus (HPV), every 5 years. Some types of HPV increase your risk of cervical cancer. Testing for HPV may also be done on women of any age with unclear Pap test results.  Other health care providers may not recommend any screening for nonpregnant women who are considered low risk for pelvic cancer and who do not have symptoms. Ask your health care provider if a screening pelvic exam is right for you.  If you have had past treatment for cervical cancer or a condition that could lead to cancer, you need Pap tests and screening for cancer for at least 20 years after your treatment. If Pap tests have been discontinued, your risk factors (such as having a new sexual partner) need to be reassessed to determine if screening should resume. Some women have medical problems that increase the chance of getting cervical cancer. In these cases, your health care provider may recommend more frequent screening and Pap tests. Colorectal Cancer  This type of cancer can be detected and often prevented.  Routine colorectal cancer screening usually begins at 56 years of age and continues through 56 years of age.  Your health care provider  may recommend screening at an earlier age if you have risk factors for colon cancer.  Your health care provider may also recommend using home test kits to check for hidden blood in the stool.  A small camera at the end of a tube can be used to examine your colon directly (sigmoidoscopy or colonoscopy). This is done to check for the earliest forms of colorectal cancer.  Routine screening usually begins at age 34.  Direct examination of the colon should be repeated every 5-10 years through 56 years of age. However, you may need to be screened more often if early forms of precancerous polyps or small growths are found. Skin Cancer  Check your skin from head to toe regularly.  Tell your health care provider about any new moles or changes in moles, especially if there is a change in a mole's shape or color.  Also tell your health care provider if you have a mole that is larger than the size of  a pencil eraser.  Always use sunscreen. Apply sunscreen liberally and repeatedly throughout the day.  Protect yourself by wearing long sleeves, pants, a wide-brimmed hat, and sunglasses whenever you are outside. HEART DISEASE, DIABETES, AND HIGH BLOOD PRESSURE   High blood pressure causes heart disease and increases the risk of stroke. High blood pressure is more likely to develop in:  People who have blood pressure in the high end of the normal range (130-139/85-89 mm Hg).  People who are overweight or obese.  People who are African American.  If you are 109-36 years of age, have your blood pressure checked every 3-5 years. If you are 61 years of age or older, have your blood pressure checked every year. You should have your blood pressure measured twice--once when you are at a hospital or clinic, and once when you are not at a hospital or clinic. Record the average of the two measurements. To check your blood pressure when you are not at a hospital or clinic, you can use:  An automated blood  pressure machine at a pharmacy.  A home blood pressure monitor.  If you are between 41 years and 44 years old, ask your health care provider if you should take aspirin to prevent strokes.  Have regular diabetes screenings. This involves taking a blood sample to check your fasting blood sugar level.  If you are at a normal weight and have a low risk for diabetes, have this test once every three years after 56 years of age.  If you are overweight and have a high risk for diabetes, consider being tested at a younger age or more often. PREVENTING INFECTION  Hepatitis B  If you have a higher risk for hepatitis B, you should be screened for this virus. You are considered at high risk for hepatitis B if:  You were born in a country where hepatitis B is common. Ask your health care provider which countries are considered high risk.  Your parents were born in a high-risk country, and you have not been immunized against hepatitis B (hepatitis B vaccine).  You have HIV or AIDS.  You use needles to inject street drugs.  You live with someone who has hepatitis B.  You have had sex with someone who has hepatitis B.  You get hemodialysis treatment.  You take certain medicines for conditions, including cancer, organ transplantation, and autoimmune conditions. Hepatitis C  Blood testing is recommended for:  Everyone born from [birth year] through 1967.  Anyone with known risk factors for hepatitis C. Sexually transmitted infections (STIs)  You should be screened for sexually transmitted infections (STIs) including gonorrhea and chlamydia if:  You are sexually active and are younger than 56 years of age.  You are older than 56 years of age and your health care provider tells you that you are at risk for this type of infection.  Your sexual activity has changed since you were last screened and you are at an increased risk for chlamydia or gonorrhea. Ask your health care provider if you are at  risk.  If you do not have HIV, but are at risk, it may be recommended that you take a prescription medicine daily to prevent HIV infection. This is called pre-exposure prophylaxis (PrEP). You are considered at risk if:  You are sexually active and do not regularly use condoms or know the HIV status of your partner(s).  You take drugs by injection.  You are sexually active with a partner who has HIV.  Talk with your health care provider about whether you are at high risk of being infected with HIV. If you choose to begin PrEP, you should first be tested for HIV. You should then be tested every 3 months for as long as you are taking PrEP.  PREGNANCY   If you are premenopausal and you may become pregnant, ask your health care provider about preconception counseling.  If you may become pregnant, take 400 to 800 micrograms (mcg) of folic acid every day.  If you want to prevent pregnancy, talk to your health care provider about birth control (contraception). OSTEOPOROSIS AND MENOPAUSE   Osteoporosis is a disease in which the bones lose minerals and strength with aging. This can result in serious bone fractures. Your risk for osteoporosis can be identified using a bone density scan.  If you are 61 years of age or older, or if you are at risk for osteoporosis and fractures, ask your health care provider if you should be screened.  Ask your health care provider whether you should take a calcium or vitamin D supplement to lower your risk for osteoporosis.  Menopause may have certain physical symptoms and risks.  Hormone replacement therapy may reduce some of these symptoms and risks. Talk to your health care provider about whether hormone replacement therapy is right for you.  HOME CARE INSTRUCTIONS   Schedule regular health, dental, and eye exams.  Stay current with your immunizations.   Do not use any tobacco products including cigarettes, chewing tobacco, or electronic cigarettes.  If  you are pregnant, do not drink alcohol.  If you are breastfeeding, limit how much and how often you drink alcohol.  Limit alcohol intake to no more than 1 drink per day for nonpregnant women. One drink equals 12 ounces of beer, 5 ounces of wine, or 1 ounces of hard liquor.  Do not use street drugs.  Do not share needles.  Ask your health care provider for help if you need support or information about quitting drugs.  Tell your health care provider if you often feel depressed.  Tell your health care provider if you have ever been abused or do not feel safe at home.   This information is not intended to replace advice given to you by your health care provider. Make sure you discuss any questions you have with your health care provider.   Document Released: 03/15/2011 Document Revised: 09/20/2014 Document Reviewed: 08/01/2013 Elsevier Interactive Patient Education [employer].

## 2015-09-03 ENCOUNTER — Encounter (HOSPITAL_COMMUNITY): Payer: Self-pay

## 2015-09-03 ENCOUNTER — Emergency Department (HOSPITAL_COMMUNITY)
Admission: EM | Admit: 2015-09-03 | Discharge: 2015-09-03 | Disposition: A | Discharge status: Home / Self Care | Payer: 59 | Source: Home / Self Care | Attending: Emergency Medicine | Admitting: Emergency Medicine

## 2015-09-03 ENCOUNTER — Ambulatory Visit: Payer: 59 | Admitting: Family Medicine

## 2015-09-03 DIAGNOSIS — R590 Localized enlarged lymph nodes: Secondary | ICD-10-CM

## 2015-09-03 DIAGNOSIS — J329 Chronic sinusitis, unspecified: Secondary | ICD-10-CM

## 2015-09-03 DIAGNOSIS — J069 Acute upper respiratory infection, unspecified: Secondary | ICD-10-CM

## 2015-09-03 DIAGNOSIS — J310 Chronic rhinitis: Secondary | ICD-10-CM

## 2015-09-03 MED ORDER — MOMETASONE FUROATE 50 MCG/ACT NA SUSP: 2.0000 | Freq: Every day | NASAL | Status: DC

## 2015-09-03 MED ORDER — PSEUDOEPHEDRINE-GUAIFENESIN ER 120-1200 MG PO TB12: 1.0000 | ORAL_TABLET | Freq: Two times a day (BID) | ORAL | Status: DC | PRN

## 2015-09-03 MED ORDER — IBUPROFEN 800 MG PO TABS: 800.0000 mg | ORAL_TABLET | Freq: Three times a day (TID) | ORAL | Status: DC

## 2015-09-03 NOTE — ED Provider Notes (Signed)
HPI  SUBJECTIVE:  [name] is a 54 y.o. female who presents with nasal congestion, right-sided sinus pain/pressure, frontal headache described as dull, constant, pressure-like, and a tender swelling along her right neck starting yesterday. No aggravating or alleviating factors. She tried 2 tabs of the left over amoxicillin, unknown dose for this. She denies dental pain, sore throat, drooling, trismus, facial swelling, voice changes, nausea, vomiting, fevers, stiff neck. She transports patients to and from appointments, so is unsure if she has had any sick contacts. She denies ear pain, allergy symptoms. No pain with rotating her head. No body aches, flulike symptoms, coughing, wheezing, chest pain, shortness of breath. The swelling in her neck does not change with salivation. Past medical history of mitral valve prolapse, negative for diabetes, hypertension.  Past Medical History  Diagnosis Date  . Mitral valve prolapse   . Rapid heart beat     States "irreg heart beat"  . Hemorrhoids   . Anemia   . GERD (gastroesophageal reflux disease)   . Positive PPD   . Genital warts   . Hx of cardiac catheterization 2009    normal coronary arteries  . Hx of menorrhagia     Novasure  . Normal cardiac stress test 03/09/2012    no ischemia  . Hypokalemia   . Thyroid disease     Thyroid nodules  . Hypomagnesemia   . Hypophosphatemia   . Colon polyp 08/28/2012    Tubular adenoma  . Lactose intolerance in adult   . Dense breast     Past Surgical History  Procedure Laterality Date  . Novasure ablation    . Upper gastrointestinal endoscopy  May 2013  . Cardiac catheterization      x2 with normal results per pt    Family History  Problem Relation Age of Onset  . Heart disease Father     died Mi 83  . Hypertension Maternal Grandfather   . Diabetes Maternal Grandfather   . Heart disease Maternal Grandfather   . Colon cancer Maternal Grandfather   . Diabetes Maternal Grandmother      Social History  Substance Use Topics  . Smoking status: Never Smoker   . Smokeless tobacco: Never Used  . Alcohol Use: No    No current facility-administered medications for this encounter.  Current outpatient prescriptions:  .  CALCIUM PO, Take by mouth daily., Disp: , Rfl:  .  cholecalciferol (VITAMIN D) 1000 UNITS tablet, Take 2,000 Units by mouth every morning. , Disp: , Rfl:  .  ibuprofen (ADVIL,MOTRIN) 800 MG tablet, Take 1 tablet (800 mg total) by mouth 3 (three) times daily., Disp: 30 tablet, Rfl: 0 .  metoprolol (LOPRESSOR) 50 MG tablet, Take 25 mg by mouth 2 (two) times daily., Disp: , Rfl: 1 .  mometasone (NASONEX) 50 MCG/ACT nasal spray, Place 2 sprays into the nose daily., Disp: 17 g, Rfl: 0 .  PARoxetine (PAXIL-CR) 25 MG 24 hr tablet, Take 25 mg by mouth every morning. , Disp: , Rfl:  .  potassium chloride SA (KLOR-CON M20) 20 MEQ tablet, Take 1 tablet (20 mEq total) by mouth 2 (two) times daily., Disp: 180 tablet, Rfl: 3 .  Pseudoephedrine-Guaifenesin (MUCINEX D) [phone] MG TB12, Take 1 tablet by mouth 2 (two) times daily as needed (congestion)., Disp: 20 each, Rfl: 0  Allergies  Allergen Reactions  . Ceftriaxone Sodium Itching  . Doxycycline Rash     ROS  As noted in HPI.   Physical Exam  BP 112/75  mmHg  Pulse 70  Temp(Src) 97.9 F (36.6 C) (Oral)  Resp 16  SpO2 99%  Constitutional: Well developed, well nourished, no acute distress Eyes:  EOMI, conjunctiva normal bilaterally HENT: Normocephalic, atraumatic,mucus membranes moist. positive nasal congestion, swollen, erythematous turbinates with near occlusion of the nares. No sinus tenderness. Normal oropharynx. Tonsils normal. Uvula midline. Voice normal. No drooling, stridor. Dentition normal. No dental tenderness. No gingival swelling. No parotid tenderness. No submandibular swelling. No expressible purulent drainage from order Stensen's duct. Respiratory: Normal inspiratory effort, good air movement,  lungs clear bilaterally. Cardiovascular: Normal rate, regular rhythm no murmurs, rubs, gallops GI: nondistended skin: No rash, skin intact Lymph: Single tender upper cervical right-sided, mobile, round mass consistent with lymph node R neck Musculoskeletal: no deformities Neurologic: Alert & oriented x 3, no focal neuro deficits Psychiatric: Speech and behavior appropriate   ED Course   Medications - No data to display  No orders of the defined types were placed in this encounter.    No results found for this or any previous visit (from the past 24 hour(s)). No results found.  ED Clinical Impression  Lymphadenopathy, cervical  Rhinosinusitis  URI (upper respiratory infection)   ED Assessment/Plan Presentation most consistent with a rhinosinusitis/upper respiratory infection with some isolated cervical lymphadenopathy. There does not appear to be a dental Infection sialadenitis, or salivary gland infection. No evidence of deep space infection such as Ludwig's angina, no evidence of airway compromise., Nasonex, saline nasal irrigation, Mucinex D, ibuprofen. Follow-up with primary care physician as needed return here if not getting better, go to the ER for any signs of airway compromise.  Discussed MDM, plan and followup with patient . Discussed sn/sx that should prompt return to the UC or ED. Patient agrees with plan.   *This clinic note was created using Dragon dictation software. Therefore, there may be occasional mistakes despite careful proofreading.  ?   Melynda Ripple, MD 09/05/15 [phone]

## 2015-09-03 NOTE — Discharge Instructions (Signed)
The  Mucinex D amy make your heart beat very fast. If this happens, discontinue it. Saline nasal irrigation, Take the medication as written. Return if you get worse, have a fever >100.4, or for any concerns. You may take 800 mg of motrin with 1 gram of tylenol up to 3 times a day as needed for pain. This is an effective combination for pain.  Most sinus infections are viral and do not need antibiotics unless you have a high fever, have had this for 10 days, or you get better and then get sick again. Use a neti pot or the NeilMed sinus rinse as often as you want to to reduce nasal congestion. Follow the directions on the box.   Go to www.goodrx.com to look up your medications. This will give you a list of where you can find your prescriptions at the most affordable prices.

## 2015-09-03 NOTE — ED Notes (Signed)
Patient complains of having a headache to the right side of her head with some pressure As well as a swollen gland to the right side of her neck

## 2015-09-16 ENCOUNTER — Telehealth: Payer: Self-pay | Admitting: Internal Medicine

## 2015-09-16 NOTE — Telephone Encounter (Signed)
FYI

## 2015-09-16 NOTE — Telephone Encounter (Signed)
Patient Name: KYANNE ESKIN DOB: 06/06/1961 Initial Comment Caller states has had a cold for about 2 weeks; chest seems tp be hurting; Nurse Assessment Nurse: Marcelline Deist, RN, Lynda Date/Time (Eastern Time): 09/16/2015 11:38:36 AM Confirm and document reason for call. If symptomatic, describe symptoms. ---Caller states has had a cold for about 2 weeks. Her chest seems to be hurting on sides by ribs. Is at the office now. Was seen at an UC 2 weeks ago, told to take OTC Mucinex. Has the patient traveled out of the country within the last 30 days? ---Not Applicable Does the patient have any new or worsening symptoms? ---Yes Will a triage be completed? ---No Select reason for no triage. ---Patient declined Please document clinical information provided and list any resource used. ---Caller states she is at the office, wanted to come in for a chest x ray. Nurse checked schedule which has no availability and in profile it states the practice does not have x ray capabilities. As nurse was about to start triage, the caller stated she will just go back to the UC for a chest x ray. Guidelines Guideline Title Affirmed Question Affirmed Notes Final Disposition User Clinical Call [place], RN, Kermit Balo

## 2015-09-16 NOTE — Telephone Encounter (Signed)
Spoke to the pt.  She stated she was seen by her "heart doctor" and was dx with bronchitis.  Given medication to treat it.  Advised she call back if needed.

## 2015-09-16 NOTE — Telephone Encounter (Signed)
Noted.  Will see if pt visits ED/UC

## 2015-09-25 ENCOUNTER — Other Ambulatory Visit: Payer: Self-pay | Admitting: Cardiovascular Disease

## 2015-09-25 ENCOUNTER — Ambulatory Visit
Admission: RE | Admit: 2015-09-25 | Discharge: 2015-09-25 | Disposition: A | Discharge status: Home / Self Care | Payer: BLUE CROSS/BLUE SHIELD | Source: Ambulatory Visit | Attending: Cardiovascular Disease | Admitting: Cardiovascular Disease

## 2015-09-25 DIAGNOSIS — R059 Cough, unspecified: Secondary | ICD-10-CM

## 2015-09-25 DIAGNOSIS — R05 Cough: Secondary | ICD-10-CM

## 2015-09-25 DIAGNOSIS — R0602 Shortness of breath: Secondary | ICD-10-CM

## 2015-09-26 ENCOUNTER — Encounter (HOSPITAL_COMMUNITY): Payer: Self-pay | Admitting: Emergency Medicine

## 2015-09-26 ENCOUNTER — Other Ambulatory Visit: Payer: Self-pay | Admitting: Cardiovascular Disease

## 2015-09-26 ENCOUNTER — Emergency Department (HOSPITAL_COMMUNITY)
Admission: EM | Admit: 2015-09-26 | Discharge: 2015-09-26 | Discharge status: Against Medical Advice | Payer: BLUE CROSS/BLUE SHIELD | Attending: Emergency Medicine | Admitting: Emergency Medicine

## 2015-09-26 DIAGNOSIS — R110 Nausea: Secondary | ICD-10-CM | POA: Diagnosis not present

## 2015-09-26 DIAGNOSIS — R918 Other nonspecific abnormal finding of lung field: Secondary | ICD-10-CM

## 2015-09-26 DIAGNOSIS — R05 Cough: Secondary | ICD-10-CM | POA: Insufficient documentation

## 2015-09-26 DIAGNOSIS — R0602 Shortness of breath: Secondary | ICD-10-CM | POA: Insufficient documentation

## 2015-09-26 NOTE — ED Notes (Signed)
No answer when pt's name called in the waiting room; pt eloped; unable to locate pt

## 2015-09-26 NOTE — ED Notes (Signed)
Patient presents for non productive cough, SOB, nausea x3 weeks. Patient denies vomiting, diarrhea. Patient states "My doctor wanted to admit me, but I said no. But I can't sleep and I feel bad".

## 2015-09-26 NOTE — ED Notes (Signed)
No answer when pt's name called in the waiting room 

## 2015-10-02 ENCOUNTER — Other Ambulatory Visit: Payer: BLUE CROSS/BLUE SHIELD

## 2015-10-17 ENCOUNTER — Encounter (HOSPITAL_COMMUNITY): Payer: Self-pay | Admitting: Emergency Medicine

## 2015-10-17 ENCOUNTER — Emergency Department (HOSPITAL_COMMUNITY): Payer: BLUE CROSS/BLUE SHIELD

## 2015-10-17 ENCOUNTER — Emergency Department (HOSPITAL_COMMUNITY)
Admission: EM | Admit: 2015-10-17 | Discharge: 2015-10-17 | Disposition: A | Discharge status: Home / Self Care | Payer: BLUE CROSS/BLUE SHIELD | Attending: Emergency Medicine | Admitting: Emergency Medicine

## 2015-10-17 DIAGNOSIS — K219 Gastro-esophageal reflux disease without esophagitis: Secondary | ICD-10-CM

## 2015-10-17 DIAGNOSIS — Z9889 Other specified postprocedural states: Secondary | ICD-10-CM | POA: Diagnosis not present

## 2015-10-17 DIAGNOSIS — R0600 Dyspnea, unspecified: Secondary | ICD-10-CM | POA: Diagnosis not present

## 2015-10-17 DIAGNOSIS — Z8742 Personal history of other diseases of the female genital tract: Secondary | ICD-10-CM | POA: Insufficient documentation

## 2015-10-17 DIAGNOSIS — R0602 Shortness of breath: Secondary | ICD-10-CM | POA: Diagnosis present

## 2015-10-17 DIAGNOSIS — Z862 Personal history of diseases of the blood and blood-forming organs and certain disorders involving the immune mechanism: Secondary | ICD-10-CM | POA: Diagnosis not present

## 2015-10-17 DIAGNOSIS — E876 Hypokalemia: Secondary | ICD-10-CM | POA: Diagnosis not present

## 2015-10-17 DIAGNOSIS — Z86010 Personal history of colonic polyps: Secondary | ICD-10-CM | POA: Diagnosis not present

## 2015-10-17 DIAGNOSIS — Z8619 Personal history of other infectious and parasitic diseases: Secondary | ICD-10-CM | POA: Insufficient documentation

## 2015-10-17 DIAGNOSIS — Z79899 Other long term (current) drug therapy: Secondary | ICD-10-CM | POA: Insufficient documentation

## 2015-10-17 LAB — COMPREHENSIVE METABOLIC PANEL
ALK PHOS: 57 U/L (ref 38–126)
ALT: 20 U/L (ref 14–54)
ANION GAP: 8 (ref 5–15)
AST: 28 U/L (ref 15–41)
Albumin: 3.9 g/dL (ref 3.5–5.0)
BILIRUBIN TOTAL: 0.5 mg/dL (ref 0.3–1.2)
BUN: 15 mg/dL (ref 6–20)
CALCIUM: 9.1 mg/dL (ref 8.9–10.3)
CO2: 24 mmol/L (ref 22–32)
Chloride: 107 mmol/L (ref 101–111)
Creatinine, Ser: 0.94 mg/dL (ref 0.44–1.00)
GLUCOSE: 104 mg/dL — AB (ref 65–99)
Potassium: 4.3 mmol/L (ref 3.5–5.1)
Sodium: 139 mmol/L (ref 135–145)
TOTAL PROTEIN: 6.9 g/dL (ref 6.5–8.1)

## 2015-10-17 LAB — CBC WITH DIFFERENTIAL/PLATELET
Basophils Absolute: 0 10*3/uL (ref 0.0–0.1)
Basophils Relative: 0 %
Eosinophils Absolute: 0.1 10*3/uL (ref 0.0–0.7)
Eosinophils Relative: 1 %
HEMATOCRIT: 40.1 % (ref 36.0–46.0)
HEMOGLOBIN: 12.6 g/dL (ref 12.0–15.0)
LYMPHS ABS: 1.7 10*3/uL (ref 0.7–4.0)
Lymphocytes Relative: 21 %
MCH: 23.9 pg — AB (ref 26.0–34.0)
MCHC: 31.4 g/dL (ref 30.0–36.0)
MCV: 75.9 fL — AB (ref 78.0–100.0)
MONOS PCT: 14 %
Monocytes Absolute: 1.2 10*3/uL — ABNORMAL HIGH (ref 0.1–1.0)
NEUTROS ABS: 5.3 10*3/uL (ref 1.7–7.7)
NEUTROS PCT: 64 %
Platelets: 253 10*3/uL (ref 150–400)
RBC: 5.28 MIL/uL — ABNORMAL HIGH (ref 3.87–5.11)
RDW: 14.1 % (ref 11.5–15.5)
WBC: 8.4 10*3/uL (ref 4.0–10.5)

## 2015-10-17 LAB — TROPONIN I: Troponin I: <0.03 ng/mL (ref <0.031)

## 2015-10-17 MED ORDER — ALUM & MAG HYDROXIDE-SIMETH 200-200-20 MG/5ML PO SUSP
30.0000 mL | Freq: Once | ORAL | Status: AC
  Administered 2015-10-17: 30 mL via ORAL
  Filled 2015-10-17: qty 30

## 2015-10-17 MED ORDER — OMEPRAZOLE 20 MG PO CPDR: 20.0000 mg | DELAYED_RELEASE_CAPSULE | Freq: Every day | ORAL | Status: DC

## 2015-10-17 NOTE — Discharge Instructions (Signed)

## 2015-10-17 NOTE — ED Notes (Signed)
Patient observed on the phone.  Patient says feels better. 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  This RN will make him aware.

## 2015-10-17 NOTE — ED Notes (Signed)
Patient states that she is feeling nausea's. Patient is stating she is having trouble with breathing. She says her dr saw shadowing on her xray. Patient states she has not had time to get an CT done. Patient has not taken anything.

## 2015-10-17 NOTE — ED Notes (Signed)
Patient transported to X-ray 

## 2015-10-17 NOTE — ED Notes (Signed)
Patient d/c'd self care.  F/U and medications discussed.  Patient verbalized understanding. 

## 2015-10-17 NOTE — ED Provider Notes (Signed)
CSN: [id number]     Arrival date & time 10/17/15  [id number] History   First MD Initiated Contact with Patient 10/17/15 0406     Chief Complaint  Patient presents with  . Shortness of Breath  . Nausea     HPI Patient presents to the emergency department complaining of mild shortness of breath that awoke her from sleep.  She states she's intermittently been having shortness of breath and upper respiratory symptoms over the past 4-6 weeks.  She's been working with her physicians regarding this.  She states since awakening this morning she continues to have episodes of belching.  She denies chest pain or pressure.  She reports mild nausea.  She denies vomiting.  No diarrhea.  Denies upper abdominal pain.  She recently had a chest x-ray that demonstrated some possible "shadowing" as reported by her physician.  She was scheduled for a CT of her chest but she canceled this.  Denies orthopnea.  Denies unilateral leg swelling.  No fevers or chills.  Mild cough.  No significant productive nature.  No fever.  Symptoms are mild in severity.   Past Medical History  Diagnosis Date  . Mitral valve prolapse   . Rapid heart beat     States "irreg heart beat"  . Hemorrhoids   . Anemia   . GERD (gastroesophageal reflux disease)   . Positive PPD   . Genital warts   . Hx of cardiac catheterization 2009    normal coronary arteries  . Hx of menorrhagia     Novasure  . Normal cardiac stress test 03/09/2012    no ischemia  . Hypokalemia   . Thyroid disease     Thyroid nodules  . Hypomagnesemia   . Hypophosphatemia   . Colon polyp 08/28/2012    Tubular adenoma  . Lactose intolerance in adult   . Dense breast    Past Surgical History  Procedure Laterality Date  . Novasure ablation    . Upper gastrointestinal endoscopy  May 2013  . Cardiac catheterization      x2 with normal results per pt   Family History  Problem Relation Age of Onset  . Heart disease Father     died Mi 28  . Hypertension Maternal  Grandfather   . Diabetes Maternal Grandfather   . Heart disease Maternal Grandfather   . Colon cancer Maternal Grandfather   . Diabetes Maternal Grandmother    Social History  Substance Use Topics  . Smoking status: Never Smoker   . Smokeless tobacco: Never Used  . Alcohol Use: No   OB History    No data available     Review of Systems  All other systems reviewed and are negative.     Allergies  Ceftriaxone sodium and Doxycycline  Home Medications   Prior to Admission medications   Medication Sig Start Date End Date Taking? Authorizing Provider  cholecalciferol (VITAMIN D) 1000 UNITS tablet Take 2,000 Units by mouth every morning.    Yes Historical Provider, MD  metoprolol (LOPRESSOR) 50 MG tablet Take 25 mg by mouth 2 (two) times daily. 11/06/14  Yes Historical Provider, MD  PARoxetine (PAXIL-CR) 25 MG 24 hr tablet Take 25 mg by mouth every morning.    Yes Historical Provider, MD  potassium chloride SA (KLOR-CON M20) 20 MEQ tablet Take 1 tablet (20 mEq total) by mouth 2 (two) times daily. 08/21/15  Yes Burnis Medin, MD  ibuprofen (ADVIL,MOTRIN) 800 MG tablet Take 1 tablet (800 mg total)  by mouth 3 (three) times daily. Patient not taking: Reported on 10/17/2015 09/03/15   Melynda Ripple, MD  mometasone (NASONEX) 50 MCG/ACT nasal spray Place 2 sprays into the nose daily. Patient not taking: Reported on 10/17/2015 09/03/15   Melynda Ripple, MD  Pseudoephedrine-Guaifenesin [facility] D) [phone] MG TB12 Take 1 tablet by mouth 2 (two) times daily as needed (congestion). Patient not taking: Reported on 10/17/2015 09/03/15   Melynda Ripple, MD   BP 123/70 mmHg  Pulse 85  Temp(Src) 98.3 F (36.8 C) (Oral)  Resp   Ht 5\' 6"  (1.676 m)  Wt 195 lb (88.451 kg)  BMI 31.49 kg/m2  SpO2 100% Physical Exam  Constitutional: She is oriented to person, place, and time. She appears well-developed and well-nourished. No distress.  HENT:  Head: Normocephalic and atraumatic.  Eyes: EOM are  normal.  Neck: Normal range of motion.  Cardiovascular: Normal rate, regular rhythm and normal heart sounds.   Pulmonary/Chest: Effort normal and breath sounds normal.  Abdominal: Soft. She exhibits no distension. There is no tenderness.  Musculoskeletal: Normal range of motion.  Neurological: She is alert and oriented to person, place, and time.  Skin: Skin is warm and dry.  Psychiatric: She has a normal mood and affect. Judgment normal.  Nursing note and vitals reviewed.   ED Course  Procedures (including critical care time) Labs Review Labs Reviewed  CBC WITH DIFFERENTIAL/PLATELET - Abnormal; Notable for the following:    RBC 5.28 (*)    MCV 75.9 (*)    MCH 23.9 (*)    Monocytes Absolute 1.2 (*)    All other components within normal limits  COMPREHENSIVE METABOLIC PANEL - Abnormal; Notable for the following:    Glucose, Bld 104 (*)    All other components within normal limits  TROPONIN I    Imaging Review Dg Chest 2 View  10/17/2015  CLINICAL DATA:  Awoke with shortness of breath and chest pain EXAM: CHEST  2 VIEW COMPARISON:  09/25/2015 FINDINGS: Normal heart size and mediastinal contours. No acute infiltrate or edema. No effusion or pneumothorax. No acute osseous findings. IMPRESSION: Negative chest. Electronically Signed   By: Monte Fantasia M.D.   On: 10/17/2015 04:35   I have personally reviewed and evaluated these images and lab results as part of my medical decision-making.   EKG Interpretation   Date/Time:  Friday October 17 2015 03:48:47 EST Ventricular Rate:  80 PR Interval:  171 QRS Duration: 80 QT Interval:  379 QTC Calculation: 437 R Axis:   37 Text Interpretation:  Sinus rhythm No significant change was found  Confirmed by Indigo Chaddock  MD, Dalayna Lauter ([id number]) on 10/17/2015 3:52:37 AM      MDM   Final diagnoses:  None    Patient feels much better after Maalox.  I suspect much of this is GI given her belching.  She could have GERD as a cause of her recurrent  upper respiratory symptoms and cough over the past 4-6 weeks.  Patient be started on Prilosec.  Primary care follow-up.  She understands return the ER for new or worsening symptoms.  Doubt PE.  Doubt ACS.    [name], MD 10/17/15 [phone]

## 2015-11-22 ENCOUNTER — Ambulatory Visit (INDEPENDENT_AMBULATORY_CARE_PROVIDER_SITE_OTHER): Payer: BLUE CROSS/BLUE SHIELD | Admitting: Physician Assistant

## 2015-11-22 ENCOUNTER — Encounter (HOSPITAL_COMMUNITY): Payer: Self-pay | Admitting: Emergency Medicine

## 2015-11-22 ENCOUNTER — Emergency Department (HOSPITAL_COMMUNITY): Payer: BLUE CROSS/BLUE SHIELD

## 2015-11-22 ENCOUNTER — Encounter: Payer: Self-pay | Admitting: Physician Assistant

## 2015-11-22 ENCOUNTER — Emergency Department (HOSPITAL_COMMUNITY)
Admission: EM | Admit: 2015-11-22 | Discharge: 2015-11-22 | Disposition: A | Discharge status: Home / Self Care | Payer: BLUE CROSS/BLUE SHIELD | Attending: Emergency Medicine | Admitting: Emergency Medicine

## 2015-11-22 VITALS — BP 152/78 | HR 97 | Temp 97.6°F | Resp 16 | Ht 66.0 in | Wt 191.0 lb

## 2015-11-22 DIAGNOSIS — Z8619 Personal history of other infectious and parasitic diseases: Secondary | ICD-10-CM | POA: Insufficient documentation

## 2015-11-22 DIAGNOSIS — Z79899 Other long term (current) drug therapy: Secondary | ICD-10-CM | POA: Insufficient documentation

## 2015-11-22 DIAGNOSIS — K219 Gastro-esophageal reflux disease without esophagitis: Secondary | ICD-10-CM | POA: Diagnosis not present

## 2015-11-22 DIAGNOSIS — Z9889 Other specified postprocedural states: Secondary | ICD-10-CM | POA: Insufficient documentation

## 2015-11-22 DIAGNOSIS — R001 Bradycardia, unspecified: Secondary | ICD-10-CM | POA: Insufficient documentation

## 2015-11-22 DIAGNOSIS — R1013 Epigastric pain: Secondary | ICD-10-CM

## 2015-11-22 DIAGNOSIS — Z8742 Personal history of other diseases of the female genital tract: Secondary | ICD-10-CM | POA: Diagnosis not present

## 2015-11-22 DIAGNOSIS — Z86010 Personal history of colonic polyps: Secondary | ICD-10-CM | POA: Insufficient documentation

## 2015-11-22 DIAGNOSIS — E876 Hypokalemia: Secondary | ICD-10-CM | POA: Insufficient documentation

## 2015-11-22 DIAGNOSIS — R0789 Other chest pain: Secondary | ICD-10-CM | POA: Diagnosis not present

## 2015-11-22 DIAGNOSIS — R079 Chest pain, unspecified: Secondary | ICD-10-CM

## 2015-11-22 DIAGNOSIS — Z8679 Personal history of other diseases of the circulatory system: Secondary | ICD-10-CM | POA: Insufficient documentation

## 2015-11-22 LAB — I-STAT TROPONIN, ED
TROPONIN I, POC: 0 ng/mL (ref 0.00–0.08)
Troponin i, poc: 0 ng/mL (ref 0.00–0.08)

## 2015-11-22 LAB — COMPREHENSIVE METABOLIC PANEL
ALK PHOS: 52 U/L (ref 38–126)
ALT: 18 U/L (ref 14–54)
ANION GAP: 9 (ref 5–15)
AST: 23 U/L (ref 15–41)
Albumin: 3.5 g/dL (ref 3.5–5.0)
BILIRUBIN TOTAL: 0.5 mg/dL (ref 0.3–1.2)
BUN: 9 mg/dL (ref 6–20)
CALCIUM: 9.1 mg/dL (ref 8.9–10.3)
CO2: 26 mmol/L (ref 22–32)
CREATININE: 0.77 mg/dL (ref 0.44–1.00)
Chloride: 105 mmol/L (ref 101–111)
GFR calc non Af Amer: >60 mL/min (ref >60)
GLUCOSE: 112 mg/dL — AB (ref 65–99)
Potassium: 3.9 mmol/L (ref 3.5–5.1)
Sodium: 140 mmol/L (ref 135–145)
TOTAL PROTEIN: 6 g/dL — AB (ref 6.5–8.1)

## 2015-11-22 LAB — CBC WITH DIFFERENTIAL/PLATELET
Basophils Absolute: 0 10*3/uL (ref 0.0–0.1)
Basophils Relative: 0 %
Eosinophils Absolute: 0.1 10*3/uL (ref 0.0–0.7)
Eosinophils Relative: 1 %
HEMATOCRIT: 38.6 % (ref 36.0–46.0)
HEMOGLOBIN: 12.2 g/dL (ref 12.0–15.0)
LYMPHS ABS: 1 10*3/uL (ref 0.7–4.0)
LYMPHS PCT: 18 %
MCH: 23.6 pg — AB (ref 26.0–34.0)
MCHC: 31.6 g/dL (ref 30.0–36.0)
MCV: 74.8 fL — ABNORMAL LOW (ref 78.0–100.0)
MONOS PCT: 11 %
Monocytes Absolute: 0.6 10*3/uL (ref 0.1–1.0)
NEUTROS ABS: 3.6 10*3/uL (ref 1.7–7.7)
NEUTROS PCT: 70 %
Platelets: 198 10*3/uL (ref 150–400)
RBC: 5.16 MIL/uL — AB (ref 3.87–5.11)
RDW: 13.7 % (ref 11.5–15.5)
WBC: 5.1 10*3/uL (ref 4.0–10.5)

## 2015-11-22 LAB — LIPASE, BLOOD: Lipase: 24 U/L (ref 11–51)

## 2015-11-22 MED ORDER — MORPHINE SULFATE (PF) 4 MG/ML IV SOLN
4.0000 mg | Freq: Once | INTRAVENOUS | Status: AC
  Administered 2015-11-22: 4 mg via INTRAVENOUS
  Filled 2015-11-22: qty 1

## 2015-11-22 MED ORDER — NITROGLYCERIN 0.3 MG SL SUBL: 0.4000 mg | SUBLINGUAL_TABLET | SUBLINGUAL | Status: DC | PRN

## 2015-11-22 MED ORDER — SODIUM CHLORIDE 0.9 % IV BOLUS (SEPSIS)
1000.0000 mL | Freq: Once | INTRAVENOUS | Status: AC
  Administered 2015-11-22: 1000 mL via INTRAVENOUS

## 2015-11-22 MED ORDER — GI COCKTAIL ~~LOC~~
30.0000 mL | Freq: Once | ORAL | Status: AC
  Administered 2015-11-22: 30 mL via ORAL
  Filled 2015-11-22: qty 30

## 2015-11-22 MED ORDER — NITROGLYCERIN 0.3 MG SL SUBL
0.4000 mg | SUBLINGUAL_TABLET | SUBLINGUAL | Status: DC | PRN
  Administered 2015-11-22: 0.3 mg via SUBLINGUAL

## 2015-11-22 MED ORDER — NITROGLYCERIN 0.4 MG SL SUBL
0.4000 mg | SUBLINGUAL_TABLET | SUBLINGUAL | Status: DC | PRN
Start: 2015-11-22 — End: 2015-11-22

## 2015-11-22 NOTE — Patient Instructions (Signed)
     IF you received an x-ray today, you will receive an invoice from [facility]. Please contact [facility] at [phone] with questions or concerns regarding your invoice.   IF you received labwork today, you will receive an invoice from [facility]. Please contact [facility] at [phone] with questions or concerns regarding your invoice.   Our billing staff will not be able to assist you with questions regarding bills from these companies.  You will be contacted with the lab results as soon as they are available. The fastest way to get your results is to activate your My Chart account. Instructions are located on the last page of this paperwork. If you have not heard from us regarding the results in 2 weeks, please contact this office.      

## 2015-11-22 NOTE — Progress Notes (Addendum)
11/22/2015 9:58 AM   DOB: [birth year] / MRN: [id number]  SUBJECTIVE:  [name] is a 55 y.o. female presenting for left substernal chest pain that started 1 hour ago.  The pain is left substernal and does not radiate.  She denies SOB, diaphoresis and presyncope.  She has never had this problem before.  She has a history of GERD, no history of gall bladder disease. She is a never smoker and denies a history of DM2, hypertension, smoking.  She had a cardiac cath in 2009. I could not find this in [facility] however she reports this was normal.      She is allergic to ceftriaxone sodium and doxycycline.   She  has a past medical history of Mitral valve prolapse; Rapid heart beat; Hemorrhoids; Anemia; GERD (gastroesophageal reflux disease); Positive PPD; Genital warts; cardiac catheterization (2009); menorrhagia; Normal cardiac stress test (03/09/2012); Hypokalemia; Thyroid disease; Hypomagnesemia; Hypophosphatemia; Colon polyp (08/28/2012); Lactose intolerance in adult; and Dense breast.    She  reports that she has never smoked. She has never used smokeless tobacco. She reports that she does not drink alcohol or use illicit drugs. She  reports that she currently engages in sexual activity. The patient  has past surgical history that includes Novasure ablation; Upper gastrointestinal endoscopy (May 2013); and Cardiac catheterization.  Her family history includes Colon cancer in her maternal grandfather; Diabetes in her maternal grandfather and maternal grandmother; Heart disease in her father and maternal grandfather; Hypertension in her maternal grandfather.  Review of Systems  Respiratory: Negative for cough.   Cardiovascular: Negative for orthopnea.  Gastrointestinal: Negative for abdominal pain.  Neurological: Negative for dizziness and headaches.    Problem list and medications reviewed and updated by myself where necessary, and exist elsewhere in the encounter.   OBJECTIVE:  BP 152/78 mmHg   Pulse 97  Temp(Src) 97.6 F (36.4 C)  Resp 16  Ht 5\' 6"  (1.676 m)  Wt 191 lb (86.637 kg)  BMI 30.84 kg/m2  SpO2 99%    Physical Exam  Constitutional: She is oriented to person, place, and time. She appears well-developed and well-nourished. No distress.  Eyes: EOM are normal. Pupils are equal, round, and reactive to light.  Cardiovascular: Normal rate and regular rhythm.   Pulmonary/Chest: Effort normal and breath sounds normal. No respiratory distress. She has no wheezes. She has no rales. She exhibits no tenderness.  Abdominal: She exhibits no distension.  Neurological: She is alert and oriented to person, place, and time. No cranial nerve deficit. Gait normal.  Skin: Skin is dry. She is not diaphoretic.  Psychiatric: She has a normal mood and affect.  Vitals reviewed.   No results found for this or any previous visit (from the past 72 hour(s)).  No results found.  ASSESSMENT AND PLAN  Elantra was seen today for chest pain.  Diagnoses and all orders for this visit:  Chest pain, unspecified chest pain type: She has T wave inversion in lead V2 and V3 that is new from her last EKG recently.  She had instant relief of the pain with Nitroglycerin.  We have given her 324 of ASA and started an IV in the left hand. Will send her to [facility] for further eval given that her symptoms may represent ACS.    -     EKG 12-Lead -     Discontinue: nitroGLYCERIN (NITROSTAT) SL tablet 0.3 mg; Place 1 tablet (0.3 mg total) under the tongue every 5 (five) minutes as needed for  chest pain. -     nitroGLYCERIN (NITROSTAT) SL tablet 0.3 mg; Place 1 tablet (0.3 mg total) under the tongue every 5 (five) minutes as needed for chest pain.    The patient was advised to call or return to clinic if she does not see an improvement in symptoms or to seek the care of the closest emergency department if she worsens with the above plan.   Philis Fendt, MHS, PA-C [facility] 11/22/2015 9:58 AM

## 2015-11-22 NOTE — ED Notes (Signed)
Pt here from MD office with c/o chest pain , no sob pt received 324 asa and 1 nitro  From office which relieved  Chest pain

## 2015-11-22 NOTE — ED Notes (Signed)
Patient transported to X-ray 

## 2015-11-22 NOTE — ED Provider Notes (Signed)
CSN: [place]:[id number]     Arrival date & time 11/22/15  1031 History   First MD Initiated Contact with Patient 11/22/15 1032     Chief Complaint  Patient presents with  . Chest Pain     (Consider location/radiation/quality/duration/timing/severity/associated sxs/prior Treatment) HPI Comments: [name] is a 55 y.o. female with a PMHx of mitral valve prolapse, "irregular heart beat", hypokalemia, anemia, GERD, thyroid nodules, and prior normal cardiac stress test in 2013 and normal LHC in 2009, who presents to the ED with complaints of gradual onset left-sided chest pain that began initially 5 days ago but worsened today around 8 AM while she was laying in bed. She describes the pain as 8/10 intermittent cramping on the left side of her chest just underneath her breast, radiating into the epigastrium, with no known and rating factors, unchanged by exertion or inspiration, and improved with 324 mg aspirin and one nitroglycerin given at urgent care prior to arrival. She arrives chest pain-free, but states that it's gradually returning. States that initially when it started 5 days ago, she felt she had some heartburn and "gas" pain. She was sent here by urgent care for concerning EKG findings associated with her chest pain. She is seen by cardiologist Dr. Doylene Canard. EKG at urgent care was reported as having "ST segment depression in lead V2 and V3 that is new from her last EKG recently" per notes by Philis Fendt PA-C.  She denies any fevers, chills, diaphoresis, lightheadedness, shortness of breath, leg swelling, recent travel/surgery/immobilization, history of DVT/PE, estrogen use, claudication, orthopnea, cough, abdominal pain, nausea, vomiting, diarrhea, constipation, dysuria, hematuria, numbness, tingling, weakness, or smoking use. She does have a positive family history of CAD/CABG in her maternal grandfather and MI at age 28 in her paternal uncle who died as a result of his MI. No personal hx of HTN,  HLD, or DM2.  Patient is a 55 y.o. female presenting with chest pain. The history is provided by the patient. No language interpreter was used.  Chest Pain Pain location:  L chest Pain quality comment:  Cramping Pain radiates to:  Epigastrium Pain radiates to the back: no   Pain severity:  Moderate Onset quality:  Gradual Duration:  5 days Timing:  Intermittent Progression:  Worsening Chronicity:  Recurrent Context: at rest   Relieved by:  Aspirin and nitroglycerin Worsened by:  Nothing tried Ineffective treatments:  None tried Associated symptoms: heartburn   Associated symptoms: no abdominal pain, no claudication, no cough, no diaphoresis, no fever, no lower extremity edema, no nausea, no numbness, no orthopnea, no shortness of breath, not vomiting and no weakness   Risk factors: no birth control, no coronary artery disease, no diabetes mellitus, no high cholesterol, no hypertension, no immobilization, no prior DVT/PE, no smoking and no surgery     Past Medical History  Diagnosis Date  . Mitral valve prolapse   . Rapid heart beat     States "irreg heart beat"  . Hemorrhoids   . Anemia   . GERD (gastroesophageal reflux disease)   . Positive PPD   . Genital warts   . Hx of cardiac catheterization 2009    normal coronary arteries  . Hx of menorrhagia     Novasure  . Normal cardiac stress test 03/09/2012    no ischemia  . Hypokalemia   . Thyroid disease     Thyroid nodules  . Hypomagnesemia   . Hypophosphatemia   . Colon polyp 08/28/2012    Tubular adenoma  .  Lactose intolerance in adult   . Dense breast    Past Surgical History  Procedure Laterality Date  . Novasure ablation    . Upper gastrointestinal endoscopy  May 2013  . Cardiac catheterization      x2 with normal results per pt   Family History  Problem Relation Age of Onset  . Heart disease Father     died Mi 84  . Hypertension Maternal Grandfather   . Diabetes Maternal Grandfather   . Heart disease  Maternal Grandfather   . Colon cancer Maternal Grandfather   . Diabetes Maternal Grandmother    Social History  Substance Use Topics  . Smoking status: Never Smoker   . Smokeless tobacco: Never Used  . Alcohol Use: No   OB History    No data available     Review of Systems  Constitutional: Negative for fever, chills and diaphoresis.  Respiratory: Negative for cough and shortness of breath.   Cardiovascular: Positive for chest pain. Negative for orthopnea, claudication and leg swelling.  Gastrointestinal: Positive for heartburn. Negative for nausea, vomiting, abdominal pain, diarrhea and constipation.  Genitourinary: Negative for dysuria and hematuria.  Musculoskeletal: Negative for myalgias and arthralgias.  Skin: Negative for color change.  Allergic/Immunologic: Negative for immunocompromised state.  Neurological: Negative for weakness, light-headedness and numbness.  Psychiatric/Behavioral: Negative for confusion.   10 Systems reviewed and are negative for acute change except as noted in the HPI.    Allergies  Ceftriaxone sodium and Doxycycline  Home Medications   Prior to Admission medications   Medication Sig Start Date End Date Taking? Authorizing Provider  cholecalciferol (VITAMIN D) 1000 UNITS tablet Take 2,000 Units by mouth every morning.     Historical Provider, MD  metoprolol (LOPRESSOR) 50 MG tablet Take 25 mg by mouth 2 (two) times daily. 11/06/14   Historical Provider, MD  omeprazole (PRILOSEC) 20 MG capsule Take 1 capsule (20 mg total) by mouth daily. 10/17/15   [name], MD  PARoxetine (PAXIL-CR) 25 MG 24 hr tablet Take 25 mg by mouth every morning.     Historical Provider, MD  potassium chloride SA (KLOR-CON M20) 20 MEQ tablet Take 1 tablet (20 mEq total) by mouth 2 (two) times daily. 08/21/15   Burnis Medin, MD   BP 102/56 mmHg  Pulse 59  Temp(Src) 98.3 F (36.8 C) (Oral)  Resp 16  SpO2 100% Physical Exam  Constitutional: She is oriented to person,  place, and time. Vital signs are normal. She appears well-developed and well-nourished.  Non-toxic appearance. No distress.  Afebrile, nontoxic, NAD  HENT:  Head: Normocephalic and atraumatic.  Mouth/Throat: Oropharynx is clear and moist and mucous membranes are normal.  Eyes: Conjunctivae and EOM are normal. Right eye exhibits no discharge. Left eye exhibits no discharge.  Neck: Normal range of motion. Neck supple.  Cardiovascular: Normal rate, regular rhythm, normal heart sounds and intact distal pulses.  Exam reveals no gallop and no friction rub.   No murmur heard. HR 59-60s during exam, reg rhythm, no audible m/r/g, nl s1/s2, no pedal edema, distal pulses intact  Pulmonary/Chest: Effort normal and breath sounds normal. No respiratory distress. She has no decreased breath sounds. She has no wheezes. She has no rhonchi. She has no rales. She exhibits no tenderness, no crepitus, no deformity and no retraction.  CTAB in all lung fields, no w/r/r, no hypoxia or increased WOB, speaking in full sentences, SpO2 100% on RA  Chest wall nonTTP without crepitus, deformities, or retractions  Abdominal: Soft. Normal appearance and bowel sounds are normal. She exhibits no distension. There is tenderness in the epigastric area. There is no rigidity, no rebound, no guarding, no CVA tenderness, no tenderness at McBurney's point and negative Murphy's sign.  Soft, nondistended, +BS throughout, with mild epigastric TTP, no r/g/r, neg murphy's, neg mcburney's, no CVA TTP   Musculoskeletal: Normal range of motion.  MAE x4 Strength and sensation grossly intact Distal pulses intact No pedal edema, neg homan's bilaterally   Neurological: She is alert and oriented to person, place, and time. She has normal strength. No sensory deficit.  Skin: Skin is warm, dry and intact. No rash noted.  Psychiatric: She has a normal mood and affect.  Nursing note and vitals reviewed.   ED Course  Procedures (including  critical care time) Labs Review Labs Reviewed  CBC WITH DIFFERENTIAL/PLATELET - Abnormal; Notable for the following:    RBC 5.16 (*)    MCV 74.8 (*)    MCH 23.6 (*)    All other components within normal limits  COMPREHENSIVE METABOLIC PANEL - Abnormal; Notable for the following:    Glucose, Bld 112 (*)    Total Protein 6.0 (*)    All other components within normal limits  LIPASE, BLOOD  I-STAT TROPOININ, ED  Randolm Idol, ED    Imaging Review Dg Chest 2 View  11/22/2015  CLINICAL DATA:  55 year old female with history of left-sided chest pain. Prior history of cardiac catheterization. Known history of mitral valve prolapse. EXAM: CHEST  2 VIEW COMPARISON:  Chest x-ray 05/05/2016. FINDINGS: Lung volumes are normal. No consolidative airspace disease. No pleural effusions. No pneumothorax. No pulmonary nodule or mass noted. Pulmonary vasculature and the cardiomediastinal silhouette are within normal limits. IMPRESSION: No radiographic evidence of acute cardiopulmonary disease. Electronically Signed   By: Vinnie Langton M.D.   On: 11/22/2015 13:02     NucMed Stress Test 03/10/12:  Study Result     * *RADIOLOGY REPORT*  Clinical Data: Chest pain,.  MYOCARDIAL IMAGING WITH SPECT (REST AND EXERCISE) GATED LEFT VENTRICULAR WALL MOTION STUDY LEFT VENTRICULAR EJECTION FRACTION  Technique: Standard myocardial SPECT imaging was performed after resting intravenous injection of Tc-8m Myoview. Subsequently, exercise tolerance test was performed by the patient under the supervision of the Cardiology staff. At peak-stress, Tc-34m Myoview was injected intravenously and standard myocardial SPECT imaging was performed. Quantitative gated imaging was also performed to evaluate left ventricular wall motion, and estimate left ventricular ejection fraction.  Radiopharmaceutical: Tc-1m Myoview 48mCi at rest and 72mCi during stress.  Comparison: None  MYOCARDIAL IMAGING WITH  SPECT (REST AND EXERCISE-STRESS)  Findings: No exercise induced areas of myocardial reversibility identified.  GATED LEFT VENTRICULAR WALL MOTION STUDY  Findings: Review of the gated images demonstrates normal left ventricular wall motion and thickening  LEFT VENTRICULAR EJECTION FRACTION  Findings: QGS ejection fraction measures 73% , with an end- diastolic volume of 68 ml and an end-systolic volume of 19 ml.  IMPRESSION: 1. No evidence for exercise-induced myocardial ischemia.  2. Normal left ventricular systolic function. 3. Left ventricular ejection fraction equals 73%.  Original Report Authenticated By: Angelita Ingles, M.D.    I have personally reviewed and evaluated these images and lab results as part of my medical decision-making.   EKG Interpretation   Date/Time:  Saturday November 22 2015 10:55:25 EST Ventricular Rate:  58 PR Interval:  166 QRS Duration: 79 QT Interval:  394 QTC Calculation: 387 R Axis:   43 Text Interpretation:  Sinus  rhythm Nonspecific T abnormalities, anterior  leads T wave inversions present on previous EKGs No significant change  since last tracing Confirmed by LITTLE MD, RACHEL [phone]) on 11/22/2015  11:03:17 AM      MDM   Final diagnoses:  Atypical chest pain  Epigastric abdominal pain  Gastroesophageal reflux disease, esophagitis presence not specified  Bradycardia    55 y.o. female here with L CP that began initially 5 days ago but was intermittent until today when it worsened this morning around 8am, was seen at [facility] where she had an EKG that was abnormal (per notes from [employer], "She has ST segment depression in lead V2 and V3 that is new from her last EKG recently", although I could not appreciate this upon review). Received 324mg  ASA and 1NTG which improved symptoms entirely, but she states she's now having some mild return of symptoms. On exam, pain reproducible in the epigastrum, but not over the L  chest wall, no hypoxia or tachycardia, no pedal edema or calf tenderness, doubt PE. Story concerning for atypical ACS-type pain vs GERD, neg murphy's on exam therefore doubt gallbladder etiology. Will get labs, EKG, CXR, and give GI cocktail, morphine, and NTG for symptoms (if BP allows for NTG/morphine administration). Will reassess shortly but she will likely need admission for ACS r/o. She is Dr. Merrilee Jansky patient. Of note, stress test in 2013 was neg, no echo on file, reported neg heart cath in 2009 but no record of this in the system.   1:11 PM EKG unchanged from prior EKGs with TWI seen in previous EKGs, Dr. Rex Kras and I reviewed it and can't find any ST depression, I can't find any ST depression in the [facility] EKG. CXR neg. Trop neg at 3hr mark, will recheck at 2pm. CBC w/diff unremarkable. CMP and lipase WNL. HEART score 3, so I feel that we could likely delta-trop this pt and if negative with no recurrent severe CP then we could d/c home with close cardiology f/up. Pain improved after morphine and GI cocktail, although she states it's still lingering somewhat. BP soft, so will hold off on NTG. She declines wanting anything else. Her symptoms overall seem most consistent with GERD/heartburn. Will recheck trop at 2pm and if neg I feel she can go home with close outpt f/up. Will reassess shortly.   2:35 PM Second trop neg. Overall, results and exam reassuring. This could be GERD/heartburn, discussed zantac/tums/maalox. Discussed close f/up with Dr. Doylene Canard, and with her PCP. Strict return precautions discussed. Pt stable and feels well, feels she is safe to go home as well. I explained the diagnosis and have given explicit precautions to return to the ER including for any other new or worsening symptoms. The patient understands and accepts the medical plan as it's been dictated and I have answered their questions. Discharge instructions concerning home care and prescriptions have been given. The patient is  STABLE and is discharged to home in good condition.  BP 105/73 mmHg  Pulse 56  Temp(Src) 98.3 F (36.8 C) (Oral)  Resp 16  SpO2 97%  Meds ordered this encounter  Medications  . morphine 4 MG/ML injection 4 mg    Sig:   . gi cocktail (Maalox,Lidocaine,Donnatal)    Sig:   . sodium chloride 0.9 % bolus 1,000 mL    Sig:      Jeralyn Nolden Camprubi-Soms, PA-C 11/22/15 [place], MD 11/23/15 [phone]

## 2015-11-22 NOTE — Discharge Instructions (Signed)
Your work up today is reassuring. Use tylenol or motrin as needed for pain. Use over-the-counter maalox or tums as needed for symptoms, since your symptoms could be related to indigestion/heartburn. Use over-the-counter zantac as needed for additional symptom relief. Avoid spicy/fatty/fried/acidic foods, avoid alcohol use and soda intake, avoid caffeine intake as these may all aggravate symptoms. Follow up closely with Dr. Doylene Canard in the next 2-3 days for recheck of symptoms, and with your primary care doctor in 5-7 days for recheck of symptoms. Return to the ER for changes or worsening symptoms.     Nonspecific Chest Pain It is often hard to find the cause of chest pain. There is always a chance that your pain could be related to something serious, such as a heart attack or a blood clot in your lungs. Chest pain can also be caused by conditions that are not life-threatening. If you have chest pain, it is very important to follow up with your doctor.  HOME CARE  If you were prescribed an antibiotic medicine, finish it all even if you start to feel better.  Avoid any activities that cause chest pain.  Do not use any tobacco products, including cigarettes, chewing tobacco, or electronic cigarettes. If you need help quitting, ask your doctor.  Do not drink alcohol.  Take medicines only as told by your doctor.  Keep all follow-up visits as told by your doctor. This is important. This includes any further testing if your chest pain does not go away.  Your doctor may tell you to keep your head raised (elevated) while you sleep.  Make lifestyle changes as told by your doctor. These may include:  Getting regular exercise. Ask your doctor to suggest some activities that are safe for you.  Eating a heart-healthy diet. Your doctor or a diet specialist (dietitian) can help you to learn healthy eating options.  Maintaining a healthy weight.  Managing diabetes, if necessary.  Reducing  stress. GET HELP IF:  Your chest pain does not go away, even after treatment.  You have a rash with blisters on your chest.  You have a fever. GET HELP RIGHT AWAY IF:  Your chest pain is worse.  You have an increasing cough, or you cough up blood.  You have severe belly (abdominal) pain.  You feel extremely weak.  You pass out (faint).  You have chills.  You have sudden, unexplained chest discomfort.  You have sudden, unexplained discomfort in your arms, back, neck, or jaw.  You have shortness of breath at any time.  You suddenly start to sweat, or your skin gets clammy.  You feel nauseous.  You vomit.  You suddenly feel light-headed or dizzy.  Your heart begins to beat quickly, or it feels like it is skipping beats. These symptoms may be an emergency. Do not wait to see if the symptoms will go away. Get medical help right away. Call your local emergency services (911 in the [place] Do not drive yourself to the hospital.   This information is not intended to replace advice given to you by your health care provider. Make sure you discuss any questions you have with your health care provider.   Document Released: 02/16/2008 Document Revised: 09/20/2014 Document Reviewed: 04/05/2014 Elsevier Interactive Patient Education 2016 Else[employer]  Heartburn Heartburn is a type of pain or discomfort that can happen in the throat or chest. It is often described as a burning pain. It may also cause a bad taste in the mouth. Heartburn may  feel worse when you lie down or bend over. It may be caused by stomach contents that move back up (reflux) into the tube that connects the mouth with the stomach (esophagus). HOME CARE Take these actions to lessen your discomfort and to help avoid problems. Diet  Follow a diet as told by your doctor. You may need to avoid foods and drinks such as:  Coffee and tea (with or without caffeine).  Drinks that contain alcohol.  Energy drinks and  sports drinks.  Carbonated drinks or sodas.  Chocolate and cocoa.  Peppermint and mint flavorings.  Garlic and onions.  Horseradish.  Spicy and acidic foods, such as peppers, chili powder, curry powder, vinegar, hot sauces, and BBQ sauce.  Citrus fruit juices and citrus fruits, such as oranges, lemons, and limes.  Tomato-based foods, such as red sauce, chili, salsa, and pizza with red sauce.  Fried and fatty foods, such as donuts, french fries, potato chips, and high-fat dressings.  High-fat meats, such as hot dogs, rib eye steak, sausage, ham, and bacon.  High-fat dairy items, such as whole milk, butter, and cream cheese.  Eat small meals often. Avoid eating large meals.  Avoid drinking large amounts of liquid with your meals.  Avoid eating meals during the 2-3 hours before bedtime.  Avoid lying down right after you eat.  Do not exercise right after you eat. General Instructions  Pay attention to any changes in your symptoms.  Take over-the-counter and prescription medicines only as told by your doctor. Do not take aspirin, ibuprofen, or other NSAIDs unless your doctor says it is okay.  Do not use any tobacco products, including cigarettes, chewing tobacco, and e-cigarettes. If you need help quitting, ask your doctor.  Wear loose clothes. Do not wear anything tight around your waist.  Raise (elevate) the head of your bed about 6 inches (15 cm).  Try to lower your stress. If you need help doing this, ask your doctor.  If you are overweight, lose an amount of weight that is healthy for you. Ask your doctor about a safe weight loss goal.  Keep all follow-up visits as told by your doctor. This is important. GET HELP IF:  You have new symptoms.  You lose weight and you do not know why it is happening.  You have trouble swallowing, or it hurts to swallow.  You have wheezing or a cough that keeps happening.  Your symptoms do not get better with treatment.  You  have heartburn often for more than two weeks. GET HELP RIGHT AWAY IF:  You have pain in your arms, neck, jaw, teeth, or back.  You feel sweaty, dizzy, or light-headed.  You have chest pain or shortness of breath.  You throw up (vomit) and your throw up looks like blood or coffee grounds.  Your poop (stool) is bloody or black.   This information is not intended to replace advice given to you by your health care provider. Make sure you discuss any questions you have with your health care provider.   Document Released: 05/12/2011 Document Revised: 05/21/2015 Document Reviewed: 12/25/2014 Elsevier Interactive Patient Education 2016 [place] for Gastroesophageal Reflux Disease, Adult When you have gastroesophageal reflux disease (GERD), the foods you eat and your eating habits are very important. Choosing the right foods can help ease your discomfort.  WHAT GUIDELINES DO I NEED TO FOLLOW?   Choose fruits, vegetables, whole grains, and low-fat dairy products.   Choose low-fat meat, fish, and  poultry.  Limit fats such as oils, salad dressings, butter, nuts, and avocado.   Keep a food diary. This helps you identify foods that cause symptoms.   Avoid foods that cause symptoms. These may be different for everyone.   Eat small meals often instead of 3 large meals a day.   Eat your meals slowly, in a place where you are relaxed.   Limit fried foods.   Cook foods using methods other than frying.   Avoid drinking alcohol.   Avoid drinking large amounts of liquids with your meals.   Avoid bending over or lying down until 2-3 hours after eating.  WHAT FOODS ARE NOT RECOMMENDED?  These are some foods and drinks that may make your symptoms worse: Vegetables Tomatoes. Tomato juice. Tomato and spaghetti sauce. Chili peppers. Onion and garlic. Horseradish. Fruits Oranges, grapefruit, and lemon (fruit and juice). Meats High-fat meats, fish, and poultry.  This includes hot dogs, ribs, ham, sausage, salami, and bacon. Dairy Whole milk and chocolate milk. Sour cream. Cream. Butter. Ice cream. Cream cheese.  Drinks Coffee and tea. Bubbly (carbonated) drinks or energy drinks. Condiments Hot sauce. Barbecue sauce.  Sweets/Desserts Chocolate and cocoa. Donuts. Peppermint and spearmint. Fats and Oils High-fat foods. This includes [place] fries and potato chips. Other Vinegar. Strong spices. This includes black pepper, white pepper, red pepper, cayenne, curry powder, cloves, ginger, and chili powder. The items listed above may not be a complete list of foods and drinks to avoid. Contact your dietitian for more information.   This information is not intended to replace advice given to you by your health care provider. Make sure you discuss any questions you have with your health care provider.   Document Released: 02/29/2012 Document Revised: 09/20/2014 Document Reviewed: 07/04/2013 Elsevier Interactive Patient Education [employer].

## 2015-11-24 ENCOUNTER — Telehealth: Payer: Self-pay | Admitting: Internal Medicine

## 2015-11-24 NOTE — Telephone Encounter (Signed)
I called and spoke with patient. Patient states she was just in ED on 11/22/15 - and they stated that her symptoms were not heart related & she does not want to go again today & be told the same thing. I offered an appointment here with Dr. Sarajane Jews & patient refused. Patient states that she has a cardiologist and would like to follow up with him instead. Patient states that if she is not able to see cardiologist today, she will let us know & see if we can still work her in. I advised patient that if her symptoms persist or get worse to please go to the ED & be assessed.

## 2015-11-24 NOTE — Telephone Encounter (Signed)
[facility] - [facility]  Patient Name: [name]  DOB: 1961/03/24    Initial Comment Caller states, chest pain on Left side, below her breast    Nurse Assessment  Nurse: Wayne Sever, RN, Tillie Rung Date/Time (Eastern Time): 11/24/2015 9:43:45 AM  Confirm and document reason for call. If symptomatic, describe symptoms. You must click the next button to save text entered. ---Caller states she has had chest pain since last week. She sent to Urgent Care on Saturday. They gave her nitroglycerin and it dissolved the pain. She was sent to ED and they decided it was not her heart and to follow up with primary MD. She states the pain came back today. She states it feels like a cramp under left breast. She is having some gas and burping with it  Has the patient traveled out of the country within the last 30 days? ---Not Applicable  Does the patient have any new or worsening symptoms? ---Yes  Will a triage be completed? ---Yes  Related visit to physician within the last 2 weeks? ---Yes  Does the PT have any chronic conditions? (i.e. diabetes, asthma, etc.) ---Yes  List chronic conditions. ---GERD, Mitral Valve Prolapsed  Is this a behavioral health or substance abuse call? ---No     Guidelines    Guideline Title Affirmed Question Affirmed Notes  Chest Pain [1] Intermittent chest pain or "angina" AND [2] increasing in severity or frequency (Exception: pains lasting a few seconds)    Final Disposition User   Go to ED Now Wayne Sever, RN, Kendra    Referrals  [facility] - ED   Disagree/Comply: Comply

## 2015-11-26 ENCOUNTER — Telehealth: Payer: Self-pay | Admitting: Internal Medicine

## 2015-11-26 NOTE — Telephone Encounter (Signed)
Left message for pt to call back  °

## 2015-11-26 NOTE — Telephone Encounter (Signed)
Pt states she is having a lot of gas/pain under her left breast. Asked if pt was taking her omeprazole and she states she only takes it when she needs it. Saw cardiologist and was told she should take the protonix that she used to take. Pt reports she started this yesterday and is some better. Pt scheduled to see Alonza Bogus PA 12/05/15@3pm . Discussed with pt that if she improves on the protonix she can cancel the appointment.

## 2015-12-04 ENCOUNTER — Ambulatory Visit (INDEPENDENT_AMBULATORY_CARE_PROVIDER_SITE_OTHER): Payer: BLUE CROSS/BLUE SHIELD | Admitting: Gastroenterology

## 2015-12-04 ENCOUNTER — Encounter: Payer: Self-pay | Admitting: Gastroenterology

## 2015-12-04 VITALS — BP 118/64 | HR 60 | Ht 66.0 in | Wt 193.8 lb

## 2015-12-04 DIAGNOSIS — R1012 Left upper quadrant pain: Secondary | ICD-10-CM

## 2015-12-04 DIAGNOSIS — R142 Eructation: Secondary | ICD-10-CM

## 2015-12-04 DIAGNOSIS — K219 Gastro-esophageal reflux disease without esophagitis: Secondary | ICD-10-CM | POA: Diagnosis not present

## 2015-12-04 NOTE — Patient Instructions (Signed)
Continue the Pantoprazole 40 mg daily. [facility] have provided you with copies of the Endoscopy and colonoscopy from 2013.

## 2015-12-04 NOTE — Progress Notes (Addendum)
     12/04/2015 [name] [id number] [birth year]   History of Present Illness:  This is a 55 year old female who is known to Dr. Hilarie Fredrickson. She has suspected irritable bowel syndrome and intermittent reflux symptoms. She presents to our office today with complaints of recent left upper quadrant abdominal pain associated with increased belching and bloating. She was previously taking pantoprazole 40 mg as needed. She ended up having some cardiac evaluation of this left upper quadrant abdominal pain, making sure there was nothing cardiac related. When that evaluation proved negative the cardiologist told her to begin taking her pantoprazole daily. She started taking that about 1 week ago on a regular basis and her symptoms have resolved.  Her last EGD was in February 2013 at which time she was found to have a completely normal exam.  Current Medications, Allergies, Past Medical History, Past Surgical History, Family History and Social History were reviewed in [employer] record.   Physical Exam: BP 118/64 mmHg  Pulse 60  Ht 5\' 6"  (1.676 m)  Wt 193 lb 12.8 oz (87.907 kg)  BMI 31.30 kg/m2 General: Well developed black female in no acute distress Head: Normocephalic and atraumatic Eyes:  Sclerae anicteric, conjunctiva pink  Ears: Normal auditory acuity Lungs: Clear throughout to auscultation Heart: Regular rate and rhythm Abdomen: Soft, non-distended.  Normal bowel sounds.  Non-tender. Musculoskeletal: Symmetrical with no gross deformities  Extremities: No edema  Neurological: Alert oriented x 4, grossly non-focal Psychological:  Alert and cooperative. Normal mood and affect  Assessment and Recommendations: -LUQ abdominal pain, belching, GERD:  Improved/resolved on pantoprazole 40 mg daily.  Will continue for a couple of months to be sure that her symptoms are completely resolved before discontinuing the medication again (she does not like to take medications  long-term).  Addendum: Reviewed and agree with initial management. Jerene Bears, MD

## 2015-12-05 ENCOUNTER — Ambulatory Visit: Payer: BLUE CROSS/BLUE SHIELD | Admitting: Gastroenterology

## 2016-01-04 ENCOUNTER — Emergency Department (HOSPITAL_COMMUNITY)
Admission: EM | Admit: 2016-01-04 | Discharge: 2016-01-04 | Disposition: A | Discharge status: Home / Self Care | Payer: BLUE CROSS/BLUE SHIELD | Attending: Emergency Medicine | Admitting: Emergency Medicine

## 2016-01-04 ENCOUNTER — Encounter (HOSPITAL_COMMUNITY): Payer: Self-pay | Admitting: Emergency Medicine

## 2016-01-04 ENCOUNTER — Emergency Department (HOSPITAL_COMMUNITY): Payer: BLUE CROSS/BLUE SHIELD

## 2016-01-04 DIAGNOSIS — K219 Gastro-esophageal reflux disease without esophagitis: Secondary | ICD-10-CM | POA: Insufficient documentation

## 2016-01-04 DIAGNOSIS — R002 Palpitations: Secondary | ICD-10-CM | POA: Insufficient documentation

## 2016-01-04 DIAGNOSIS — Z79899 Other long term (current) drug therapy: Secondary | ICD-10-CM | POA: Insufficient documentation

## 2016-01-04 DIAGNOSIS — E039 Hypothyroidism, unspecified: Secondary | ICD-10-CM | POA: Diagnosis not present

## 2016-01-04 LAB — I-STAT TROPONIN, ED: TROPONIN I, POC: 0 ng/mL (ref 0.00–0.08)

## 2016-01-04 LAB — CBC
HEMATOCRIT: 39.9 % (ref 36.0–46.0)
HEMOGLOBIN: 13 g/dL (ref 12.0–15.0)
MCH: 24.1 pg — AB (ref 26.0–34.0)
MCHC: 32.6 g/dL (ref 30.0–36.0)
MCV: 74 fL — ABNORMAL LOW (ref 78.0–100.0)
Platelets: 223 10*3/uL (ref 150–400)
RBC: 5.39 MIL/uL — AB (ref 3.87–5.11)
RDW: 14 % (ref 11.5–15.5)
WBC: 8.3 10*3/uL (ref 4.0–10.5)

## 2016-01-04 LAB — BASIC METABOLIC PANEL
ANION GAP: 6 (ref 5–15)
BUN: 12 mg/dL (ref 6–20)
CALCIUM: 9.2 mg/dL (ref 8.9–10.3)
CO2: 29 mmol/L (ref 22–32)
Chloride: 105 mmol/L (ref 101–111)
Creatinine, Ser: 0.8 mg/dL (ref 0.44–1.00)
GLUCOSE: 96 mg/dL (ref 65–99)
POTASSIUM: 3.3 mmol/L — AB (ref 3.5–5.1)
Sodium: 140 mmol/L (ref 135–145)

## 2016-01-04 MED ORDER — SODIUM CHLORIDE 0.9 % IV BOLUS (SEPSIS)
1000.0000 mL | Freq: Once | INTRAVENOUS | Status: AC
  Administered 2016-01-04: 1000 mL via INTRAVENOUS

## 2016-01-04 MED ORDER — POTASSIUM CHLORIDE CRYS ER 20 MEQ PO TBCR
40.0000 meq | EXTENDED_RELEASE_TABLET | Freq: Once | ORAL | Status: AC
  Administered 2016-01-04: 40 meq via ORAL
  Filled 2016-01-04: qty 2

## 2016-01-04 NOTE — Discharge Instructions (Signed)
1. Medications: usual home medications as directed 2. Treatment: rest, drink plenty of fluids, decrease caffeine intake 3. Follow Up: Please followup with Dr. Doylene Hanson in 1-2 days for discussion of your diagnoses and further evaluation after today's visit; if you do not have a primary care doctor use the resource guide provided to find one; Please return to the ER for return of symptoms    Palpitations A palpitation is the feeling that your heartbeat is irregular or is faster than normal. It may feel like your heart is fluttering or skipping a beat. Palpitations are usually not a serious problem. However, in some cases, you may need further medical evaluation. CAUSES  Palpitations can be caused by:  Smoking.  Caffeine or other stimulants, such as diet pills or energy drinks.  Alcohol.  Stress and anxiety.  Strenuous physical activity.  Fatigue.  Certain medicines.  Heart disease, especially if you have a history of irregular heart rhythms (arrhythmias), such as atrial fibrillation, atrial flutter, or supraventricular tachycardia.  An improperly working pacemaker or defibrillator. DIAGNOSIS  To find the cause of your palpitations, your health care provider will take your medical history and perform a physical exam. Your health care provider may also have you take a test called an ambulatory electrocardiogram (ECG). An ECG records your heartbeat patterns over a 24-hour period. You may also have other tests, such as:  Transthoracic echocardiogram (TTE). During echocardiography, sound waves are used to evaluate how blood flows through your heart.  Transesophageal echocardiogram (TEE).  Cardiac monitoring. This allows your health care provider to monitor your heart rate and rhythm in real time.  Holter monitor. This is a portable device that records your heartbeat and can help diagnose heart arrhythmias. It allows your health care provider to track your heart activity for several days,  if needed.  Stress tests by exercise or by giving medicine that makes the heart beat faster. TREATMENT  Treatment of palpitations depends on the cause of your symptoms and can vary greatly. Most cases of palpitations do not require any treatment other than time, relaxation, and monitoring your symptoms. Other causes, such as atrial fibrillation, atrial flutter, or supraventricular tachycardia, usually require further treatment. HOME CARE INSTRUCTIONS   Avoid:  Caffeinated coffee, tea, soft drinks, diet pills, and energy drinks.  Chocolate.  Alcohol.  Stop smoking if you smoke.  Reduce your stress and anxiety. Things that can help you relax include:  A method of controlling things in your body, such as your heartbeats, with your mind (biofeedback).  Yoga.  Meditation.  Physical activity such as swimming, jogging, or walking.  Get plenty of rest and sleep. SEEK MEDICAL CARE IF:   You continue to have a fast or irregular heartbeat beyond 24 hours.  Your palpitations occur more often. SEEK IMMEDIATE MEDICAL CARE IF:  You have chest pain or shortness of breath.  You have a severe headache.  You feel dizzy or you faint. MAKE SURE YOU:  Understand these instructions.  Will watch your condition.  Will get help right away if you are not doing well or get worse.   This information is not intended to replace advice given to you by your health care provider. Make sure you discuss any questions you have with your health care provider.   Document Released: 08/27/2000 Document Revised: 09/04/2013 Document Reviewed: 10/29/2011 Elsevier Interactive Patient Education [employer].

## 2016-01-04 NOTE — ED Provider Notes (Signed)
CSN: [id number]     Arrival date & time 01/04/16  1818 History   First MD Initiated Contact with Patient 01/04/16 1918     Chief Complaint  Patient presents with  . Palpitations     (Consider location/radiation/quality/duration/timing/severity/associated sxs/prior Treatment) The history is provided by the patient and medical records. No language interpreter was used.     [name] is a 55 y.o. female  with a hx of tachycardia, mitral valve prolapse, anemia, GERD, cardiac cath in 2009 with normal arteries, hypokalemia, presents to the Emergency Department complaining of acute, persistent palpitations described as "skipping."  She reports this last approx 1.5 hours and has almost totally resolved at this time. Associated symptoms included SOB at the time and felt hot, but no nausea, diaphoresis, vomiting.  Nothing makes it better and nothing makes it worse.  Pt reports 2 glasses of tea and some pepsi today.  Pt reports she had cake and ice cream today and normally avoids this.  Pt denies EtOH usage.  She reports no water today and has had less over the last several days.  Pt reports she did not take her metoprolol this morning but did take 25mg  at 4pm.  Pt reports she took a 1/2 tablet last night.  She reports she normally takes a 1/2 tablet at night and a whole tablet in the AM as she has noticed her BP has been low.    Past Medical History  Diagnosis Date  . Mitral valve prolapse   . Rapid heart beat     States "irreg heart beat"  . Hemorrhoids   . Anemia   . GERD (gastroesophageal reflux disease)   . Positive PPD   . Genital warts   . Hx of cardiac catheterization 2009    normal coronary arteries  . Hx of menorrhagia     Novasure  . Normal cardiac stress test 03/09/2012    no ischemia  . Hypokalemia   . Thyroid disease     Thyroid nodules  . Hypomagnesemia   . Hypophosphatemia   . Colon polyp 08/28/2012    Tubular adenoma  . Lactose intolerance in adult   . Dense breast     Past Surgical History  Procedure Laterality Date  . Novasure ablation    . Upper gastrointestinal endoscopy  May 2013  . Cardiac catheterization      x2 with normal results per pt   Family History  Problem Relation Age of Onset  . Heart disease Father     died Mi 67  . Hypertension Maternal Grandfather   . Diabetes Maternal Grandfather   . Heart disease Maternal Grandfather   . Colon cancer Maternal Grandfather   . Diabetes Maternal Grandmother    Social History  Substance Use Topics  . Smoking status: Never Smoker   . Smokeless tobacco: Never Used  . Alcohol Use: No   OB History    No data available     Review of Systems  Constitutional: Negative for fever, diaphoresis, appetite change, fatigue and unexpected weight change.  HENT: Negative for mouth sores.   Eyes: Negative for visual disturbance.  Respiratory: Positive for shortness of breath ( with palpitations only). Negative for cough, chest tightness and wheezing.   Cardiovascular: Positive for palpitations. Negative for chest pain.  Gastrointestinal: Negative for nausea, vomiting, abdominal pain, diarrhea and constipation.  Endocrine: Negative for polydipsia, polyphagia and polyuria.  Genitourinary: Negative for dysuria, urgency, frequency and hematuria.  Musculoskeletal: Negative for back pain and  neck stiffness.  Skin: Negative for rash.  Allergic/Immunologic: Negative for immunocompromised state.  Neurological: Negative for syncope, light-headedness and headaches.  Hematological: Does not bruise/bleed easily.  Psychiatric/Behavioral: Negative for sleep disturbance. The patient is not nervous/anxious.       Allergies  Ceftriaxone sodium and Doxycycline  Home Medications   Prior to Admission medications   Medication Sig Start Date End Date Taking? Authorizing Provider  cholecalciferol (VITAMIN D) 1000 UNITS tablet Take 2,000 Units by mouth every morning.    Yes Historical Provider, MD  metoprolol  tartrate (LOPRESSOR) 25 MG tablet Take 25 mg by mouth 2 (two) times daily. 12/29/15  Yes Historical Provider, MD  Omega-3 Fatty Acids (FISH OIL PO) Take 2 capsules by mouth daily.   Yes Historical Provider, MD  pantoprazole (PROTONIX) 40 MG tablet Take 40 mg by mouth daily as needed (acid reflux).    Yes Historical Provider, MD  PARoxetine (PAXIL-CR) 25 MG 24 hr tablet Take 25 mg by mouth every morning.    Yes Historical Provider, MD  potassium chloride SA (KLOR-CON M20) 20 MEQ tablet Take 1 tablet (20 mEq total) by mouth 2 (two) times daily. 08/21/15  Yes Burnis Medin, MD   BP 112/78 mmHg  Pulse 69  Temp(Src) 98.1 F (36.7 C) (Oral)  Resp 14  SpO2 99%  LMP 11/16/2015 Physical Exam  Constitutional: She appears well-developed and well-nourished. No distress.  Awake, alert, nontoxic appearance  HENT:  Head: Normocephalic and atraumatic.  Mouth/Throat: Oropharynx is clear and moist. No oropharyngeal exudate.  Eyes: Conjunctivae are normal. No scleral icterus.  Neck: Normal range of motion. Neck supple.  Cardiovascular: Normal rate, regular rhythm, normal heart sounds and intact distal pulses.   No murmur heard. Pulmonary/Chest: Effort normal and breath sounds normal. No respiratory distress. She has no wheezes.  Equal chest expansion  Abdominal: Soft. Bowel sounds are normal. She exhibits no mass. There is no tenderness. There is no rebound and no guarding.  Musculoskeletal: Normal range of motion. She exhibits no edema.  Neurological: She is alert.  Speech is clear and goal oriented Moves extremities without ataxia  Skin: Skin is warm and dry. She is not diaphoretic.  Psychiatric: She has a normal mood and affect.  Nursing note and vitals reviewed.   ED Course  Procedures (including critical care time) Labs Review Labs Reviewed  BASIC METABOLIC PANEL - Abnormal; Notable for the following:    Potassium 3.3 (*)    All other components within normal limits  CBC - Abnormal;  Notable for the following:    RBC 5.39 (*)    MCV 74.0 (*)    MCH 24.1 (*)    All other components within normal limits  Randolm Idol, ED    Imaging Review Dg Chest 2 View  01/04/2016  CLINICAL DATA:  Palpitations EXAM: CHEST  2 VIEW COMPARISON:  11/22/2015 chest radiograph. FINDINGS: Stable cardiomediastinal silhouette with normal heart size. No pneumothorax. No pleural effusion. Lungs appear clear, with no acute consolidative airspace disease and no pulmonary edema. IMPRESSION: No active cardiopulmonary disease. Electronically Signed   By: Ilona Sorrel M.D.   On: 01/04/2016 18:41   I have personally reviewed and evaluated these images and lab results as part of my medical decision-making.   EKG Interpretation   Date/Time:  Sunday January 04 2016 18:25:45 EDT Ventricular Rate:  80 PR Interval:  169 QRS Duration: 75 QT Interval:  349 QTC Calculation: 402 R Axis:   46 Text Interpretation:  Sinus rhythm  Probable left atrial enlargement  Borderline repolarization abnormality No significant change since last  tracing Confirmed by NGUYEN, EMILY ([id number]) on 01/04/2016 10:12:43 PM      MDM   Final diagnoses:  Palpitations   Chirsty Fitzhenry presents with approximately 1.5 hours of palpitations. Patient had mild shortness of breath during this time but no other associated symptoms. No palpitations urine the emergency department. EKG is without arrhythmia. No PAC, PVC or PJCs.  Patient reports she feels well.  Labs reassuring aside from mild hypokalemia, repleted in the emergency department. Troponin is negative. Chest x-ray without evidence of infection.  Patient given fluid bolus. She continues to feel well. Unknown etiology of her palpitations. Question lack of metoprolol along with some dehydration and increased caffeine intake today. She will follow-up with Dr. Roselee Nova tomorrow for further evaluations. Recommended she return to the emergency department for return of symptoms. Patient and  spouse state understanding and are in agreement with the plan.   Jarrett Soho Tarus Briski, PA-C 01/04/16 2219  Harvel Quale, MD 01/07/16 [phone]

## 2016-01-04 NOTE — ED Notes (Signed)
Pt states that she has been having palpitations today. Has had this before but not this bad. Alert and oriented.

## 2016-01-04 NOTE — ED Notes (Signed)
Pt ambulated to bathroom 

## 2016-01-10 ENCOUNTER — Emergency Department (HOSPITAL_COMMUNITY)
Admission: EM | Admit: 2016-01-10 | Discharge: 2016-01-10 | Disposition: A | Discharge status: Home / Self Care | Payer: BLUE CROSS/BLUE SHIELD | Attending: Emergency Medicine | Admitting: Emergency Medicine

## 2016-01-10 ENCOUNTER — Emergency Department (HOSPITAL_COMMUNITY): Payer: BLUE CROSS/BLUE SHIELD

## 2016-01-10 ENCOUNTER — Encounter (HOSPITAL_COMMUNITY): Payer: Self-pay

## 2016-01-10 DIAGNOSIS — R42 Dizziness and giddiness: Secondary | ICD-10-CM | POA: Diagnosis not present

## 2016-01-10 DIAGNOSIS — R002 Palpitations: Secondary | ICD-10-CM | POA: Diagnosis not present

## 2016-01-10 DIAGNOSIS — Z79899 Other long term (current) drug therapy: Secondary | ICD-10-CM | POA: Diagnosis not present

## 2016-01-10 DIAGNOSIS — K219 Gastro-esophageal reflux disease without esophagitis: Secondary | ICD-10-CM | POA: Insufficient documentation

## 2016-01-10 DIAGNOSIS — R0602 Shortness of breath: Secondary | ICD-10-CM | POA: Diagnosis not present

## 2016-01-10 DIAGNOSIS — N390 Urinary tract infection, site not specified: Secondary | ICD-10-CM | POA: Diagnosis not present

## 2016-01-10 LAB — URINALYSIS, ROUTINE W REFLEX MICROSCOPIC
BILIRUBIN URINE: NEGATIVE
GLUCOSE, UA: NEGATIVE mg/dL
HGB URINE DIPSTICK: NEGATIVE
KETONES UR: NEGATIVE mg/dL
Nitrite: NEGATIVE
PH: 6 (ref 5.0–8.0)
PROTEIN: NEGATIVE mg/dL
Specific Gravity, Urine: 1.009 (ref 1.005–1.030)

## 2016-01-10 LAB — CBC WITH DIFFERENTIAL/PLATELET
BASOS PCT: 0 %
Basophils Absolute: 0 10*3/uL (ref 0.0–0.1)
EOS ABS: 0.1 10*3/uL (ref 0.0–0.7)
Eosinophils Relative: 1 %
HCT: 40.6 % (ref 36.0–46.0)
HEMOGLOBIN: 13.2 g/dL (ref 12.0–15.0)
LYMPHS ABS: 1.1 10*3/uL (ref 0.7–4.0)
LYMPHS PCT: 17 %
MCH: 23.9 pg — AB (ref 26.0–34.0)
MCHC: 32.5 g/dL (ref 30.0–36.0)
MCV: 73.4 fL — ABNORMAL LOW (ref 78.0–100.0)
Monocytes Absolute: 0.7 10*3/uL (ref 0.1–1.0)
Monocytes Relative: 11 %
NEUTROS ABS: 4.6 10*3/uL (ref 1.7–7.7)
Neutrophils Relative %: 71 %
Platelets: 195 10*3/uL (ref 150–400)
RBC: 5.53 MIL/uL — ABNORMAL HIGH (ref 3.87–5.11)
RDW: 14.1 % (ref 11.5–15.5)
WBC: 6.5 10*3/uL (ref 4.0–10.5)

## 2016-01-10 LAB — I-STAT TROPONIN, ED: TROPONIN I, POC: 0.02 ng/mL (ref 0.00–0.08)

## 2016-01-10 LAB — I-STAT BETA HCG BLOOD, ED (MC, WL, AP ONLY)

## 2016-01-10 LAB — BASIC METABOLIC PANEL
Anion gap: 7 (ref 5–15)
BUN: 12 mg/dL (ref 6–20)
CHLORIDE: 109 mmol/L (ref 101–111)
CO2: 24 mmol/L (ref 22–32)
CREATININE: 0.77 mg/dL (ref 0.44–1.00)
Calcium: 8.8 mg/dL — ABNORMAL LOW (ref 8.9–10.3)
GFR calc Af Amer: >60 mL/min (ref >60)
GFR calc non Af Amer: >60 mL/min (ref >60)
Glucose, Bld: 101 mg/dL — ABNORMAL HIGH (ref 65–99)
Potassium: 4.2 mmol/L (ref 3.5–5.1)
SODIUM: 140 mmol/L (ref 135–145)

## 2016-01-10 LAB — D-DIMER, QUANTITATIVE (NOT AT ARMC): D DIMER QUANT: 0.3 ug{FEU}/mL (ref 0.00–0.50)

## 2016-01-10 LAB — URINE MICROSCOPIC-ADD ON

## 2016-01-10 LAB — TSH: TSH: 1.232 u[IU]/mL (ref 0.350–4.500)

## 2016-01-10 MED ORDER — CEPHALEXIN 500 MG PO CAPS: 500.0000 mg | ORAL_CAPSULE | Freq: Two times a day (BID) | ORAL | Status: DC

## 2016-01-10 NOTE — ED Notes (Signed)
Pt reporting foul smelling urine and urinary urgency.

## 2016-01-10 NOTE — Discharge Instructions (Signed)
Your cardiac workup today including EKG, labs, and chest x-ray was normal. We did find that you have a UTI. Take the prescribed antibiotics as directed. Recommend that you follow-up with your cardiologist, call to make an appointment. Return here for any new or worsening symptoms.

## 2016-01-10 NOTE — ED Provider Notes (Signed)
CSN: [id number]     Arrival date & time 01/10/16  0431 History   First MD Initiated Contact with Patient 01/10/16 0606     Chief Complaint  Patient presents with  . Palpitations     (Consider location/radiation/quality/duration/timing/severity/associated sxs/prior Treatment) Patient is a 55 y.o. female presenting with palpitations. The history is provided by the patient and medical records.  Palpitations Associated symptoms: dizziness and shortness of breath     55 year old female with history of MVP, history of irregular heartbeat, anemia, GERD, thyroid nodules, presenting to the ED for palpitations. She was seen in the ED for similar symptoms on 01/04/16 (6 days ago).  Patient states symptoms today felt much different than before.  She states she woke up in the middle of the night to use the restroom and when she got out of bed she had sudden onset of palpitations, dizziness, shortness of breath. She states she never had any chest pain.  She states this episode lasted for a few minutes before resolving completely. She states this episode was much more severe than when she was seen in the ED a few days ago. She states she was told then her potassium was low. She reports she has been taking her home potassium supplements as directed as well as her home blood pressure medications. She denies any excessive caffeine intake. She denies any lower extremities edema, calf pain, recent travel, or prolonged immobilization. No history of DVT or PE. Patient has been seen by cardiology in the past, Dr. Doylene Canard. She reports she did wear a Holter monitor for a few days and all that was seen was several PVCs. She also had a cardiac catheterization in 2009 which was normal.    Patient is also complaining of foul-smelling urine and urinary urgency. She states she noticed this when she got out of bed this morning. She denies any abdominal pain, fever, chills, or flank pain.  Past Medical History  Diagnosis Date  .  Mitral valve prolapse   . Rapid heart beat     States "irreg heart beat"  . Hemorrhoids   . Anemia   . GERD (gastroesophageal reflux disease)   . Positive PPD   . Genital warts   . Hx of cardiac catheterization 2009    normal coronary arteries  . Hx of menorrhagia     Novasure  . Normal cardiac stress test 03/09/2012    no ischemia  . Hypokalemia   . Thyroid disease     Thyroid nodules  . Hypomagnesemia   . Hypophosphatemia   . Colon polyp 08/28/2012    Tubular adenoma  . Lactose intolerance in adult   . Dense breast    Past Surgical History  Procedure Laterality Date  . Novasure ablation    . Upper gastrointestinal endoscopy  May 2013  . Cardiac catheterization      x2 with normal results per pt   Family History  Problem Relation Age of Onset  . Heart disease Father     died Mi 50  . Hypertension Maternal Grandfather   . Diabetes Maternal Grandfather   . Heart disease Maternal Grandfather   . Colon cancer Maternal Grandfather   . Diabetes Maternal Grandmother    Social History  Substance Use Topics  . Smoking status: Never Smoker   . Smokeless tobacco: Never Used  . Alcohol Use: No   OB History    No data available     Review of Systems  Respiratory: Positive for shortness of  breath.   Cardiovascular: Positive for palpitations.  Neurological: Positive for dizziness.  All other systems reviewed and are negative.     Allergies  Ceftriaxone sodium and Doxycycline  Home Medications   Prior to Admission medications   Medication Sig Start Date End Date Taking? Authorizing Provider  cholecalciferol (VITAMIN D) 1000 UNITS tablet Take 2,000 Units by mouth every morning.    Yes Historical Provider, MD  metoprolol tartrate (LOPRESSOR) 25 MG tablet Take 25 mg by mouth 2 (two) times daily. 12/29/15  Yes Historical Provider, MD  Omega-3 Fatty Acids (FISH OIL PO) Take 2 capsules by mouth every other day.    Yes Historical Provider, MD  pantoprazole (PROTONIX) 40  MG tablet Take 40 mg by mouth daily as needed (acid reflux).    Yes Historical Provider, MD  PARoxetine (PAXIL-CR) 25 MG 24 hr tablet Take 25 mg by mouth every morning.    Yes Historical Provider, MD  potassium chloride SA (KLOR-CON M20) 20 MEQ tablet Take 1 tablet (20 mEq total) by mouth 2 (two) times daily. 08/21/15  Yes Burnis Medin, MD   BP 141/79 mmHg  Pulse 125  Temp(Src) 98.2 F (36.8 C) (Oral)  Resp 20  Ht 5\' 6"  (1.676 m)  Wt 86.183 kg  BMI 30.68 kg/m2  SpO2 100%  LMP 11/16/2015   Physical Exam  Constitutional: She is oriented to person, place, and time. She appears well-developed and well-nourished.  HENT:  Head: Normocephalic and atraumatic.  Mouth/Throat: Oropharynx is clear and moist.  Eyes: Conjunctivae and EOM are normal. Pupils are equal, round, and reactive to light.  Neck: Normal range of motion. Neck supple.  Cardiovascular: Normal rate, regular rhythm and normal heart sounds.   NSR  Pulmonary/Chest: Effort normal and breath sounds normal. No respiratory distress. She has no wheezes.  Abdominal: Soft. Bowel sounds are normal. There is no tenderness. There is no guarding.  Musculoskeletal: Normal range of motion.  No LE edema, no calf tenderness or swelling, no overlying skin changes  Neurological: She is alert and oriented to person, place, and time.  Skin: Skin is warm and dry.  Psychiatric: She has a normal mood and affect.  Nursing note and vitals reviewed.   ED Course  Procedures (including critical care time) Labs Review Labs Reviewed  CBC WITH DIFFERENTIAL/PLATELET - Abnormal; Notable for the following:    RBC 5.53 (*)    MCV 73.4 (*)    MCH 23.9 (*)    All other components within normal limits  BASIC METABOLIC PANEL - Abnormal; Notable for the following:    Glucose, Bld 101 (*)    Calcium 8.8 (*)    All other components within normal limits  URINALYSIS, ROUTINE W REFLEX MICROSCOPIC (NOT AT [facility]) - Abnormal; Notable for the following:     APPearance CLOUDY (*)    Leukocytes, UA SMALL (*)    All other components within normal limits  URINE MICROSCOPIC-ADD ON - Abnormal; Notable for the following:    Squamous Epithelial / LPF 6-30 (*)    Bacteria, UA MANY (*)    Casts HYALINE CASTS (*)    All other components within normal limits  URINE CULTURE  TSH  D-DIMER, QUANTITATIVE (NOT AT [facility])  I-STAT TROPOININ, ED  I-STAT BETA HCG BLOOD, ED ([facility], [facility], [facility] ONLY)    Imaging Review Dg Chest 2 View  01/10/2016  CLINICAL DATA:  Palpitations and shortness of breath EXAM: CHEST  2 VIEW COMPARISON:  01/04/2016 FINDINGS: Normal heart size and  mediastinal contours. No acute infiltrate or edema. No effusion or pneumothorax. No acute osseous findings. IMPRESSION: Negative chest. Electronically Signed   By: Monte Fantasia M.D.   On: 01/10/2016 06:47   I have personally reviewed and evaluated these images and lab results as part of my medical decision-making.   EKG Interpretation   Date/Time:  Saturday January 10 2016 04:35:24 EDT Ventricular Rate:  108 PR Interval:    QRS Duration: 69 QT Interval:  327 QTC Calculation: 438 R Axis:   44 Text Interpretation:  Sinus rhythm Nonspecific repol abnormality, diffuse  leads Confirmed by Christy Gentles  MD, DONALD ([id number]) on 01/10/2016 4:42:06 AM      MDM   Final diagnoses:  Palpitations  UTI (lower urinary tract infection)   55 year old female here with palpitations. She was seen here for the same 6 days ago. Reports today's episode was worse.  Patient was initially tachycardic on arrival to ED, however heart rate had returned to normal in the 80s by the time of my evaluation. She denies any palpitations during exam. Her workup from a few days ago was reviewed and is negative for any acute findings. Her EKG today does not show any PVCs or abnormal rhythm.  Lab work today is reassuring, her potassium has returned to normal limits. TSH is normal. D-dimer is negative.  Patient has remained in normal  sinus rhythm here in the ED. Her heart rate remains within normal limits. She's not had any recurrence of her palpitations here in the emergency department. At this time given negative workup and resolution of symptoms without recurrence, low suspicion for ACS, PE, dissection, or other acute cardiac event.    U/a was sent due to concern for foul smelling urine and this does appear infectious. No abdominal pain, flank pain, or hematuria noted.  Do not suspect kidney stone or pyelonephritis.  Will start on keflex for uncomplicated UTI pending urine culture. Recommended to follow-up with her cardiologist and/or PCP.  Discussed plan with patient, he/she acknowledged understanding and agreed with plan of care.  Return precautions given for new or worsening symptoms.  Larene Pickett, PA-C 01/10/16 0908  Larene Pickett, PA-C 01/10/16 [id number]  Ripley Fraise, MD 01/11/16 [phone]

## 2016-01-10 NOTE — ED Notes (Signed)
Pt. Reporting palpitations that woke her tonight with some dizzyness and shortness of breath. Pt currently reporting left arm pain. Hx of irregular heartbeat.

## 2016-01-11 LAB — URINE CULTURE

## 2016-01-12 ENCOUNTER — Inpatient Hospital Stay: Admission: RE | Admit: 2016-01-12 | Payer: BLUE CROSS/BLUE SHIELD | Source: Ambulatory Visit

## 2016-01-13 DIAGNOSIS — F411 Generalized anxiety disorder: Secondary | ICD-10-CM | POA: Diagnosis not present

## 2016-01-13 DIAGNOSIS — F331 Major depressive disorder, recurrent, moderate: Secondary | ICD-10-CM | POA: Diagnosis not present

## 2016-01-13 DIAGNOSIS — F4310 Post-traumatic stress disorder, unspecified: Secondary | ICD-10-CM | POA: Diagnosis not present

## 2016-01-19 ENCOUNTER — Inpatient Hospital Stay: Admission: RE | Admit: 2016-01-19 | Payer: BLUE CROSS/BLUE SHIELD | Source: Ambulatory Visit

## 2016-02-05 DIAGNOSIS — Z6831 Body mass index (BMI) 31.0-31.9, adult: Secondary | ICD-10-CM | POA: Diagnosis not present

## 2016-02-05 DIAGNOSIS — Z124 Encounter for screening for malignant neoplasm of cervix: Secondary | ICD-10-CM | POA: Diagnosis not present

## 2016-02-05 DIAGNOSIS — Z1151 Encounter for screening for human papillomavirus (HPV): Secondary | ICD-10-CM | POA: Diagnosis not present

## 2016-02-05 DIAGNOSIS — Z01419 Encounter for gynecological examination (general) (routine) without abnormal findings: Secondary | ICD-10-CM | POA: Diagnosis not present

## 2016-02-06 DIAGNOSIS — Z124 Encounter for screening for malignant neoplasm of cervix: Secondary | ICD-10-CM | POA: Diagnosis not present

## 2016-02-06 DIAGNOSIS — R87610 Atypical squamous cells of undetermined significance on cytologic smear of cervix (ASC-US): Secondary | ICD-10-CM | POA: Diagnosis not present

## 2016-02-19 DIAGNOSIS — N926 Irregular menstruation, unspecified: Secondary | ICD-10-CM | POA: Diagnosis not present

## 2016-02-19 DIAGNOSIS — N939 Abnormal uterine and vaginal bleeding, unspecified: Secondary | ICD-10-CM | POA: Diagnosis not present

## 2016-02-21 ENCOUNTER — Ambulatory Visit (INDEPENDENT_AMBULATORY_CARE_PROVIDER_SITE_OTHER): Payer: BLUE CROSS/BLUE SHIELD

## 2016-02-21 ENCOUNTER — Ambulatory Visit (INDEPENDENT_AMBULATORY_CARE_PROVIDER_SITE_OTHER): Payer: BLUE CROSS/BLUE SHIELD | Admitting: Urgent Care

## 2016-02-21 VITALS — BP 110/72 | HR 77 | Temp 98.9°F | Resp 18 | Ht 66.0 in | Wt 198.0 lb

## 2016-02-21 DIAGNOSIS — M25561 Pain in right knee: Secondary | ICD-10-CM

## 2016-02-21 DIAGNOSIS — S8001XA Contusion of right knee, initial encounter: Secondary | ICD-10-CM

## 2016-02-21 DIAGNOSIS — S80812A Abrasion, left lower leg, initial encounter: Secondary | ICD-10-CM

## 2016-02-21 DIAGNOSIS — S80211A Abrasion, right knee, initial encounter: Secondary | ICD-10-CM | POA: Diagnosis not present

## 2016-02-21 DIAGNOSIS — S01511A Laceration without foreign body of lip, initial encounter: Secondary | ICD-10-CM | POA: Diagnosis not present

## 2016-02-21 DIAGNOSIS — W19XXXA Unspecified fall, initial encounter: Secondary | ICD-10-CM

## 2016-02-21 LAB — POCT CBC
Granulocyte percent: 67.9 %G (ref 37–80)
HCT, POC: 39.8 % (ref 37.7–47.9)
Hemoglobin: 13.1 g/dL (ref 12.2–16.2)
Lymph, poc: 1.6 (ref 0.6–3.4)
MCH, POC: 24.2 pg — AB (ref 27–31.2)
MCHC: 32.8 g/dL (ref 31.8–35.4)
MCV: 73.9 fL — AB (ref 80–97)
MID (CBC): 0.7 (ref 0–0.9)
MPV: 7.6 fL (ref 0–99.8)
PLATELET COUNT, POC: 246 10*3/uL (ref 142–424)
POC Granulocyte: 4.9 (ref 2–6.9)
POC LYMPH %: 22.5 % (ref 10–50)
POC MID %: 9.6 % (ref 0–12)
RBC: 5.39 M/uL (ref 4.04–5.48)
RDW, POC: 14.8 %
WBC: 7.2 10*3/uL (ref 4.6–10.2)

## 2016-02-21 MED ORDER — NAPROXEN SODIUM 550 MG PO TABS: 550.0000 mg | ORAL_TABLET | Freq: Two times a day (BID) | ORAL | Status: DC

## 2016-02-21 MED ORDER — FUROSEMIDE 10 MG/ML IJ SOLN: 20.0000 mg | Freq: Once | INTRAMUSCULAR | Status: DC

## 2016-02-21 NOTE — Progress Notes (Signed)
MRN: [id number] DOB: Nov 17, 1960  Subjective:   [name] is a 55 y.o. female presenting for chief complaint of Fall  Reports 1 week history of fall while on a metal ramp. Patient broke her fall on her knees and outstretched hands. She suffered an abrasion to her left leg and right knee. Today, she reports persistent right knee pain and her left lower leg pain. Also has swelling over both areas. She had also suffered a cut to her inner right upper lip and wants to make sure her cuts and abrasions are not infected. She has not taken any medications for pain and inflammation. She also has not modified her activities and continues to work. Denies fever, redness, drainage of pus or bleeding.   [name] has a current medication list which includes the following prescription(s): cholecalciferol, metoprolol tartrate, omega-3 fatty acids, pantoprazole, paroxetine, and potassium chloride sa, and the following Facility-Administered Medications: nitroglycerin. Also is allergic to ceftriaxone sodium and doxycycline.  [name]  has a past medical history of Mitral valve prolapse; Rapid heart beat; Hemorrhoids; Anemia; GERD (gastroesophageal reflux disease); Positive PPD; Genital warts; cardiac catheterization (2009); menorrhagia; Normal cardiac stress test (03/09/2012); Hypokalemia; Thyroid disease; Hypomagnesemia; Hypophosphatemia; Colon polyp (08/28/2012); Lactose intolerance in adult; and Dense breast. Also  has past surgical history that includes Novasure ablation; Upper gastrointestinal endoscopy (May 2013); and Cardiac catheterization.  Objective:   Vitals: BP 110/72 mmHg  Pulse 77  Temp(Src) 98.9 F (37.2 C) (Oral)  Resp 18  Ht 5\' 6"  (1.676 m)  Wt 198 lb (89.812 kg)  BMI 31.97 kg/m2  SpO2 100%  LMP 02/13/2016  Physical Exam  Constitutional: She is oriented to person, place, and time. She appears well-developed and well-nourished.  HENT:  Resolving lip laceration over inner right upper  lip. No bleeding, erythema, swelling, drainage of pus or bleeding. Area is non-tender.  Cardiovascular: Normal rate.   Pulmonary/Chest: Effort normal.  Musculoskeletal:       Right knee: She exhibits swelling (trace edema just superior to patella/over abrasion). She exhibits normal range of motion, no effusion, no ecchymosis, no deformity, no laceration, no erythema, normal alignment and normal patellar mobility. Tenderness (over area outlined) found. No medial joint line, no lateral joint line, no MCL, no LCL and no patellar tendon tenderness noted.       Legs: Neurological: She is alert and oriented to person, place, and time.  Skin: Skin is warm and dry.   Right knee x-ray - Radiology report was interpreted as negative. This was faxed to our clinic.  Results for orders placed or performed in visit on 02/21/16 (from the past 24 hour(s))  POCT CBC     Status: Abnormal   Collection Time: 02/21/16  9:01 AM  Result Value Ref Range   WBC 7.2 4.6 - 10.2 K/uL   Lymph, poc 1.6 0.6 - 3.4   POC LYMPH PERCENT 22.5 10 - 50 %L   MID (cbc) 0.7 0 - 0.9   POC MID % 9.6 0 - 12 %M   POC Granulocyte 4.9 2 - 6.9   Granulocyte percent 67.9 37 - 80 %G   RBC 5.39 4.04 - 5.48 M/uL   Hemoglobin 13.1 12.2 - 16.2 g/dL   HCT, POC 39.8 37.7 - 47.9 %   MCV 73.9 (A) 80 - 97 fL   MCH, POC 24.2 (A) 27 - 31.2 pg   MCHC 32.8 31.8 - 35.4 g/dL   RDW, POC 14.8 %   Platelet Count, POC 246 142 -  424 K/uL   MPV 7.6 0 - 99.8 fL   Assessment and Plan :   1. Knee contusion, right, initial encounter 2. Right knee pain 3. Fall, initial encounter 4. Knee abrasion, right, initial encounter 5. Leg abrasion, left, initial encounter - Radiology report pending. I advised conservative management. Will start Anaprox, icing after work. Patient does not walk or lift excessively for work so I will not place work restrictions. She also denies history of heart disease, HTN and has taken naproxen without any issues in the past. RTC in  1 week if symptoms persist.   6. Laceration of lip with delay in treatment, initial encounter - Resolving, anticipatory guidance provided.  Jaynee Eagles, PA-C [facility] [phone] 02/21/2016 8:24 AM

## 2016-02-21 NOTE — Patient Instructions (Addendum)
Contusion A contusion is a deep bruise. Contusions are the result of a blunt injury to tissues and muscle fibers under the skin. The injury causes bleeding under the skin. The skin overlying the contusion may turn blue, purple, or yellow. Minor injuries will give you a painless contusion, but more severe contusions may stay painful and swollen for a few weeks.  CAUSES  This condition is usually caused by a blow, trauma, or direct force to an area of the body. SYMPTOMS  Symptoms of this condition include:  Swelling of the injured area.  Pain and tenderness in the injured area.  Discoloration. The area may have redness and then turn blue, purple, or yellow. DIAGNOSIS  This condition is diagnosed based on a physical exam and medical history. An X-ray, CT scan, or MRI may be needed to determine if there are any associated injuries, such as broken bones (fractures). TREATMENT  Specific treatment for this condition depends on what area of the body was injured. In general, the best treatment for a contusion is resting, icing, applying pressure to (compression), and elevating the injured area. This is often called the RICE strategy. Over-the-counter anti-inflammatory medicines may also be recommended for pain control.  HOME CARE INSTRUCTIONS   Rest the injured area.  If directed, apply ice to the injured area:  Put ice in a plastic bag.  Place a towel between your skin and the bag.  Leave the ice on for 20 minutes, 2-3 times per day.  If directed, apply light compression to the injured area using an elastic bandage. Make sure the bandage is not wrapped too tightly. Remove and reapply the bandage as directed by your health care provider.  If possible, raise (elevate) the injured area above the level of your heart while you are sitting or lying down.  Take over-the-counter and prescription medicines only as told by your health care provider. SEEK MEDICAL CARE IF:  Your symptoms do not  improve after several days of treatment.  Your symptoms get worse.  You have difficulty moving the injured area. SEEK IMMEDIATE MEDICAL CARE IF:   You have severe pain.  You have numbness in a hand or foot.  Your hand or foot turns pale or cold.   This information is not intended to replace advice given to you by your health care provider. Make sure you discuss any questions you have with your health care provider.   Document Released: 06/09/2005 Document Revised: 05/21/2015 Document Reviewed: 01/15/2015 Elsevier Interactive Patient Education 2016 [place] An abrasion is a cut or scrape on the outer surface of your skin. An abrasion does not extend through all of the layers of your skin. It is important to care for your abrasion properly to prevent infection. CAUSES Most abrasions are caused by falling on or gliding across the ground or another surface. When your skin rubs on something, the outer and inner layer of skin rubs off.  SYMPTOMS A cut or scrape is the main symptom of this condition. The scrape may be bleeding, or it may appear red or pink. If there was an associated fall, there may be an underlying bruise. DIAGNOSIS An abrasion is diagnosed with a physical exam. TREATMENT Treatment for this condition depends on how large and deep the abrasion is. Usually, your abrasion will be cleaned with water and mild soap. This removes any dirt or debris that may be stuck. An antibiotic ointment may be applied to the abrasion to help prevent infection.  A bandage (dressing) may be placed on the abrasion to keep it clean. You may also need a tetanus shot. HOME CARE INSTRUCTIONS Medicines  Take or apply medicines only as directed by your health care provider.  If you were prescribed an antibiotic ointment, finish all of it even if you start to feel better. Wound Care  Clean the wound with mild soap and water 2-3 times per day or as directed by your health care  provider. Pat your wound dry with a clean towel. Do not rub it.  There are many different ways to close and cover a wound. Follow instructions from your health care provider about:  Wound care.  Dressing changes and removal.  Check your wound every day for signs of infection. Watch for:  Redness, swelling, or pain.  Fluid, blood, or pus. General Instructions  Keep the dressing dry as directed by your health care provider. Do not take baths, swim, use a hot tub, or do anything that would put your wound underwater until your health care provider approves.  If there is swelling, raise (elevate) the injured area above the level of your heart while you are sitting or lying down.  Keep all follow-up visits as directed by your health care provider. This is important. SEEK MEDICAL CARE IF:  You received a tetanus shot and you have swelling, severe pain, redness, or bleeding at the injection site.  Your pain is not controlled with medicine.  You have increased redness, swelling, or pain at the site of your wound. SEEK IMMEDIATE MEDICAL CARE IF:  You have a red streak going away from your wound.  You have a fever.  You have fluid, blood, or pus coming from your wound.  You notice a bad smell coming from your wound or your dressing.   This information is not intended to replace advice given to you by your health care provider. Make sure you discuss any questions you have with your health care provider.   Document Released: 06/09/2005 Document Revised: 05/21/2015 Document Reviewed: 08/28/2014 Elsevier Interactive Patient Education 2016 [employer].     IF you received an x-ray today, you will receive an invoice from [facility]. Please contact [facility] Rad[facility] at [phone] with questions or concerns regarding your invoice.   IF you received labwork today, you will receive an invoice from [employer]. Please contact [facility] at  [phone] with questions or concerns regarding your invoice.   Our billing staff will not be able to assist you with questions regarding bills from these companies.  You will be contacted with the lab results as soon as they are available. The fastest way to get your results is to activate your My Chart account. Instructions are located on the last page of this paperwork. If you have not heard from [place] regarding the results in 2 weeks, please contact this office.   We recommend that you schedule a mammogram for breast cancer screening. Typically, you do not need a referral to do this. Please contact a local imaging center to schedule your mammogram.  [facility] - [phone]  *ask for the Radiology Department [facility] - [phone] or [phone]  [facility] - [phone] [place] [phone] [facility] - [phone]  *ask for the [facility] - [phone]  *ask for the Radiology Department [facility] - [phone]  *ask for the [facility] - [phone]  

## 2016-03-12 DIAGNOSIS — R002 Palpitations: Secondary | ICD-10-CM | POA: Diagnosis not present

## 2016-03-12 DIAGNOSIS — R072 Precordial pain: Secondary | ICD-10-CM | POA: Diagnosis not present

## 2016-03-12 DIAGNOSIS — E663 Overweight: Secondary | ICD-10-CM | POA: Diagnosis not present

## 2016-03-12 DIAGNOSIS — K047 Periapical abscess without sinus: Secondary | ICD-10-CM | POA: Diagnosis not present

## 2016-04-11 ENCOUNTER — Emergency Department (HOSPITAL_COMMUNITY)
Admission: EM | Admit: 2016-04-11 | Discharge: 2016-04-11 | Disposition: A | Discharge status: Home / Self Care | Payer: BLUE CROSS/BLUE SHIELD | Attending: Emergency Medicine | Admitting: Emergency Medicine

## 2016-04-11 ENCOUNTER — Encounter (HOSPITAL_COMMUNITY): Payer: Self-pay | Admitting: Emergency Medicine

## 2016-04-11 DIAGNOSIS — Z79899 Other long term (current) drug therapy: Secondary | ICD-10-CM | POA: Insufficient documentation

## 2016-04-11 DIAGNOSIS — K089 Disorder of teeth and supporting structures, unspecified: Secondary | ICD-10-CM | POA: Insufficient documentation

## 2016-04-11 DIAGNOSIS — K0510 Chronic gingivitis, plaque induced: Secondary | ICD-10-CM | POA: Diagnosis not present

## 2016-04-11 DIAGNOSIS — R6883 Chills (without fever): Secondary | ICD-10-CM

## 2016-04-11 LAB — URINALYSIS, ROUTINE W REFLEX MICROSCOPIC
Bilirubin Urine: NEGATIVE
Glucose, UA: NEGATIVE mg/dL
Hgb urine dipstick: NEGATIVE
Ketones, ur: NEGATIVE mg/dL
NITRITE: NEGATIVE
PH: 5 (ref 5.0–8.0)
Protein, ur: NEGATIVE mg/dL
SPECIFIC GRAVITY, URINE: 1.018 (ref 1.005–1.030)

## 2016-04-11 LAB — URINE MICROSCOPIC-ADD ON

## 2016-04-11 MED ORDER — AMOXICILLIN 500 MG PO CAPS: 500.0000 mg | ORAL_CAPSULE | Freq: Three times a day (TID) | ORAL | 0 refills | Status: DC

## 2016-04-11 NOTE — ED Provider Notes (Signed)
[place] DEPT Provider Note   CSN: [id number] Arrival date & time: 04/11/16  [id number]  First Provider Contact:  First MD Initiated Contact with Patient 04/11/16 [phone]        History   Chief Complaint Chief Complaint  Patient presents with  . Chills  . low body temperature    HPI [name] is a 55 y.o. female.HPI  patient developed chills yesterday. She took her temperature at home this morning which was 96.4. She complains of a mild gingival pain at upper gingiva for the approximate the past 2 weeks she had been treated with amoxicillin pot from her primary care physician. She has not made an appointment with the dentist yet. She said her gingival abscess head burst and drained & blood spontaneously. Approximately a week ago. She has no other associated symptoms except for "my urine smells funny" no nausea or vomiting no abdominal pain no cough no shortness of breath. Patient had been treated with amoxicillin which she completed approximately a week ago for gingival abscess  Past Medical History:  Diagnosis Date  . Anemia   . Colon polyp 08/28/2012   Tubular adenoma  . Dense breast   . Genital warts   . GERD (gastroesophageal reflux disease)   . Hemorrhoids   . Hx of cardiac catheterization 2009   normal coronary arteries  . Hx of menorrhagia    Novasure  . Hypokalemia   . Hypomagnesemia   . Hypophosphatemia   . Lactose intolerance in adult   . Mitral valve prolapse   . Normal cardiac stress test 03/09/2012   no ischemia  . Positive PPD   . Rapid heart beat    States "irreg heart beat"  . Thyroid disease    Thyroid nodules    Patient Active Problem List   Diagnosis Date Noted  . LUQ abdominal pain 12/04/2015  . Belching 12/04/2015  . Abdominal discomfort 04/08/2015  . Hypercalcemia 07/11/2013  . Sweating 07/05/2013  . Other malaise and fatigue 07/05/2013  . Hypocalcemia 07/05/2013  . Palpitations 07/05/2013  . Low blood pressure reading 07/05/2013  .  Pes planus (flat feet) 03/29/2013  . Numbness of toes 03/29/2013  . Toe pain 03/29/2013  . History of PSVT (paroxysmal supraventricular tachycardia) 06/24/2012  . Contact dermatitis 06/23/2012  . Hypokalemia 06/23/2012  . Intertrigo 06/23/2012  . Hx of cardiac catheterization   . Positive PPD   . Multinodular goiter 05/12/2012  . Sinus tachycardia ([place]) 03/09/2012  . SLEEP APNEA 01/19/2008  . GERD 01/18/2008  . DYSPEPSIA 01/18/2008  . CONSTIPATION 01/18/2008  . IRRITABLE BOWEL SYNDROME 01/18/2008    Past Surgical History:  Procedure Laterality Date  . CARDIAC CATHETERIZATION     x2 with normal results per pt  . NOVASURE ABLATION    . UPPER GASTROINTESTINAL ENDOSCOPY  May 2013    OB History    No data available       Home Medications    Prior to Admission medications   Medication Sig Start Date End Date Taking? Authorizing Provider  cholecalciferol (VITAMIN D) 1000 UNITS tablet Take 2,000 Units by mouth every morning.    Yes Historical Provider, MD  metoprolol tartrate (LOPRESSOR) 25 MG tablet Take 25 mg by mouth 2 (two) times daily. 12/29/15  Yes Historical Provider, MD  Omega-3 Fatty Acids (FISH OIL PO) Take 2 capsules by mouth every other day.    Yes Historical Provider, MD  pantoprazole (PROTONIX) 40 MG tablet Take 40 mg by mouth daily as needed (acid  reflux).    Yes Historical Provider, MD  PARoxetine (PAXIL-CR) 25 MG 24 hr tablet Take 25 mg by mouth every morning.    Yes Historical Provider, MD  potassium chloride SA (KLOR-CON M20) 20 MEQ tablet Take 1 tablet (20 mEq total) by mouth 2 (two) times daily. 08/21/15  Yes Burnis Medin, MD  vitamin E 400 UNIT capsule Take 400 Units by mouth daily.   Yes Historical Provider, MD  naproxen sodium (ANAPROX DS) 550 MG tablet Take 1 tablet (550 mg total) by mouth 2 (two) times daily with a meal. Patient not taking: Reported on 04/11/2016 02/21/16   Jaynee Eagles, PA-C    Family History Family History  Problem Relation Age of  Onset  . Heart disease Father     died Mi 2  . Hypertension Maternal Grandfather   . Diabetes Maternal Grandfather   . Heart disease Maternal Grandfather   . Colon cancer Maternal Grandfather   . Diabetes Maternal Grandmother     Social History Social History  Substance Use Topics  . Smoking status: Never Smoker  . Smokeless tobacco: Never Used  . Alcohol use No     Allergies   Ceftriaxone sodium and Doxycycline   Review of Systems Review of Systems  Constitutional: Positive for chills.  HENT: Positive for dental problem.   Respiratory: Negative.   Cardiovascular: Negative.   Gastrointestinal: Negative.   Musculoskeletal: Negative.   Skin: Negative.   Neurological: Negative.   Psychiatric/Behavioral: Negative.   All other systems reviewed and are negative.    Physical Exam Updated Vital Signs BP 145/83 (BP Location: Right Arm)   Pulse 86   Temp 97.7 F (36.5 C) (Oral)   Resp 18   SpO2 100%   Physical Exam  Constitutional: She appears well-developed and well-nourished.  HENT:  Head: Normocephalic and atraumatic.  Right Ear: External ear normal.  Left Ear: External ear normal.  Mouth/Throat: Oropharynx is clear and moist.  3 mm circular reddened area at upper gingiva immediately above teeth #8 and 9. No trismus. Oropharynx normal  Eyes: Conjunctivae are normal. Pupils are equal, round, and reactive to light.  Neck: Neck supple. No tracheal deviation present. No thyromegaly present.  Cardiovascular: Normal rate and regular rhythm.   No murmur heard. Pulmonary/Chest: Effort normal and breath sounds normal.  Abdominal: Soft. Bowel sounds are normal. She exhibits no distension. There is no tenderness.  Musculoskeletal: Normal range of motion. She exhibits no edema or tenderness.  Lymphadenopathy:    She has no cervical adenopathy.  Neurological: She is alert. Coordination normal.  Skin: Skin is warm and dry. No rash noted. She is not diaphoretic.    Psychiatric: She has a normal mood and affect.  Nursing note and vitals reviewed.    ED Treatments / Results  Labs (all labs ordered are listed, but only abnormal results are displayed) Labs Reviewed  URINALYSIS, ROUTINE W REFLEX MICROSCOPIC (NOT AT [facility])    EKG  EKG Interpretation None       Radiology No results found.  Procedures Procedures (including critical care time)  Medications Ordered in ED Medications - No data to display   Initial Impression / Assessment and Plan / ED Course  I have reviewed the triage vital signs and the nursing notes.  Pertinent labs & imaging results that were available during my care of the patient were reviewed by me and considered in my medical decision making (see chart for details).  Clinical Course  11:35 AM patient resting  comfortably.     Final Clinical Impressions(s) / ED Diagnoses  Than prescription amoxicillin. Urine sent for culture. She is instructed to call her dentist tomorrow for next available appointment Final diagnoses:  None    New Prescriptions New Prescriptions   No medications on file  Diagnosis #1 chills #2 gingivitis   Orlie Dakin, MD 04/11/16 1040

## 2016-04-11 NOTE — ED Triage Notes (Signed)
Patient states that starting yesterday "im cold". Was on antibiotics for an abscessed tooth recently. States that she took her temperature last night and 96.4 and states "i have never been that cold before".

## 2016-04-11 NOTE — Discharge Instructions (Signed)
Take the amoxicillin as prescribed. Contact dentist tomorrow to schedule the next available appointment. Return if you feel worse for any reason

## 2016-04-12 ENCOUNTER — Other Ambulatory Visit: Payer: Self-pay | Admitting: Internal Medicine

## 2016-04-12 LAB — URINE CULTURE: Special Requests: NORMAL

## 2016-04-14 ENCOUNTER — Other Ambulatory Visit: Payer: Self-pay | Admitting: Internal Medicine

## 2016-04-14 NOTE — Telephone Encounter (Signed)
Denied.  Filled on 08/21/15 for 1 year.  Message sent to the pharmacy to check patient's file.

## 2016-04-15 NOTE — Telephone Encounter (Signed)
Please refill.

## 2016-04-16 NOTE — Telephone Encounter (Signed)
Denied.  Filled for 1 year on 08/21/15.  Message sent to pharmacy to check their file.

## 2016-04-20 DIAGNOSIS — F331 Major depressive disorder, recurrent, moderate: Secondary | ICD-10-CM | POA: Diagnosis not present

## 2016-04-20 DIAGNOSIS — F4310 Post-traumatic stress disorder, unspecified: Secondary | ICD-10-CM | POA: Diagnosis not present

## 2016-04-20 DIAGNOSIS — F411 Generalized anxiety disorder: Secondary | ICD-10-CM | POA: Diagnosis not present

## 2016-04-28 ENCOUNTER — Emergency Department (HOSPITAL_COMMUNITY)
Admission: EM | Admit: 2016-04-28 | Discharge: 2016-04-28 | Discharge status: Against Medical Advice | Payer: BLUE CROSS/BLUE SHIELD

## 2016-04-28 LAB — CBG MONITORING, ED: Glucose-Capillary: 95 mg/dL (ref 65–99)

## 2016-04-28 NOTE — ED Notes (Signed)
Pt left before triage Pt decided that since her CBG wasn't high that she didn't want to be seen

## 2016-06-01 ENCOUNTER — Ambulatory Visit
Admission: RE | Admit: 2016-06-01 | Discharge: 2016-06-01 | Disposition: A | Discharge status: Home / Self Care | Payer: BLUE CROSS/BLUE SHIELD | Source: Ambulatory Visit | Attending: Cardiovascular Disease | Admitting: Cardiovascular Disease

## 2016-06-01 DIAGNOSIS — F411 Generalized anxiety disorder: Secondary | ICD-10-CM | POA: Diagnosis not present

## 2016-06-01 DIAGNOSIS — R918 Other nonspecific abnormal finding of lung field: Secondary | ICD-10-CM

## 2016-06-01 DIAGNOSIS — F331 Major depressive disorder, recurrent, moderate: Secondary | ICD-10-CM | POA: Diagnosis not present

## 2016-06-01 DIAGNOSIS — F4310 Post-traumatic stress disorder, unspecified: Secondary | ICD-10-CM | POA: Diagnosis not present

## 2016-06-01 MED ORDER — IOPAMIDOL (ISOVUE-300) INJECTION 61%: 75.0000 mL | Freq: Once | INTRAVENOUS | Status: DC | PRN

## 2016-06-28 DIAGNOSIS — I10 Essential (primary) hypertension: Secondary | ICD-10-CM | POA: Diagnosis not present

## 2016-06-28 DIAGNOSIS — R002 Palpitations: Secondary | ICD-10-CM | POA: Diagnosis not present

## 2016-06-28 DIAGNOSIS — I2510 Atherosclerotic heart disease of native coronary artery without angina pectoris: Secondary | ICD-10-CM | POA: Diagnosis not present

## 2016-06-28 DIAGNOSIS — E663 Overweight: Secondary | ICD-10-CM | POA: Diagnosis not present

## 2016-07-14 ENCOUNTER — Other Ambulatory Visit: Payer: Self-pay | Admitting: Obstetrics and Gynecology

## 2016-07-14 DIAGNOSIS — Z1231 Encounter for screening mammogram for malignant neoplasm of breast: Secondary | ICD-10-CM

## 2016-07-15 DIAGNOSIS — E876 Hypokalemia: Secondary | ICD-10-CM | POA: Diagnosis not present

## 2016-07-15 DIAGNOSIS — E559 Vitamin D deficiency, unspecified: Secondary | ICD-10-CM | POA: Diagnosis not present

## 2016-07-15 DIAGNOSIS — D649 Anemia, unspecified: Secondary | ICD-10-CM | POA: Diagnosis not present

## 2016-07-15 DIAGNOSIS — E785 Hyperlipidemia, unspecified: Secondary | ICD-10-CM | POA: Diagnosis not present

## 2016-07-23 ENCOUNTER — Ambulatory Visit: Payer: BLUE CROSS/BLUE SHIELD

## 2016-07-30 ENCOUNTER — Emergency Department (HOSPITAL_COMMUNITY): Payer: BLUE CROSS/BLUE SHIELD

## 2016-07-30 ENCOUNTER — Encounter (HOSPITAL_COMMUNITY): Payer: Self-pay | Admitting: Emergency Medicine

## 2016-07-30 ENCOUNTER — Emergency Department (HOSPITAL_COMMUNITY)
Admission: EM | Admit: 2016-07-30 | Discharge: 2016-07-30 | Disposition: A | Discharge status: Home / Self Care | Payer: BLUE CROSS/BLUE SHIELD | Attending: Emergency Medicine | Admitting: Emergency Medicine

## 2016-07-30 DIAGNOSIS — M5442 Lumbago with sciatica, left side: Secondary | ICD-10-CM | POA: Insufficient documentation

## 2016-07-30 DIAGNOSIS — G8929 Other chronic pain: Secondary | ICD-10-CM

## 2016-07-30 DIAGNOSIS — M545 Low back pain: Secondary | ICD-10-CM | POA: Diagnosis not present

## 2016-07-30 DIAGNOSIS — Z79899 Other long term (current) drug therapy: Secondary | ICD-10-CM | POA: Insufficient documentation

## 2016-07-30 DIAGNOSIS — M25552 Pain in left hip: Secondary | ICD-10-CM | POA: Diagnosis not present

## 2016-07-30 MED ORDER — NAPROXEN 500 MG PO TABS: 500.0000 mg | ORAL_TABLET | Freq: Two times a day (BID) | ORAL | 0 refills | Status: DC

## 2016-07-30 NOTE — Discharge Instructions (Signed)
May take naproxen twice a day. Please use heat to the lower back. Please continue to stretch your lower back. He may soak in warm hot bath with epson salt to relieve muscle aches. Follow-up with her primary care doctor this week for an outpatient MRI and further workup if necessary. Return to the ED if you develop loss of bowel or bladder, unable to urinate, numbness in her groin, fevers.

## 2016-07-30 NOTE — ED Provider Notes (Signed)
[place] DEPT Provider Note   CSN: [id number] Arrival date & time: 07/30/16  0156     History   Chief Complaint No chief complaint on file.   HPI [name] is a 55 y.o. female.  55 year old African-American female past medical history significant for chronic low back pain with sciatic pain, and DDD presents to the ED today with left lower back pain and left leg pain. Patient states that she has had a problem with her lower back on and off for the past 4-5 years. She has never seen a specialist for this. Patient states that the pain is gradually worsened. Walking makes the pain worse. Nothing makes the pain better. She has not tried any over-the-counter medications for the pain. The patient describes the pain as a dull ache that throbs at times. She also endorses numbness and tingling intermittently in her left hip down to her left toes. States this comes and goes and is not constant. Has history of same. The patient was concerned that she was "severing" her spinal nerves. Patient denies any new trauma or injuries. She denies any urinary symptoms including hematuria, urinary urgency, frequency, dysuria. Patient states the pain is more in her left gluteus maximus. Denies any fever, chills, headache, vision changes, chest pain, shortness breath, lightheadedness, dizziness, abdominal pain, nausea, emesis, urinary symptoms, change in bowel habits, IV drug use, history of cancer, loss of bowel or bladder, urinary retention, saddle paresthesias.      Past Medical History:  Diagnosis Date  . Anemia   . Colon polyp 08/28/2012   Tubular adenoma  . Dense breast   . Genital warts   . GERD (gastroesophageal reflux disease)   . Hemorrhoids   . Hx of cardiac catheterization 2009   normal coronary arteries  . Hx of menorrhagia    Novasure  . Hypokalemia   . Hypomagnesemia   . Hypophosphatemia   . Lactose intolerance in adult   . Mitral valve prolapse   . Normal cardiac stress test  03/09/2012   no ischemia  . Positive PPD   . Rapid heart beat    States "irreg heart beat"  . Thyroid disease    Thyroid nodules    Patient Active Problem List   Diagnosis Date Noted  . LUQ abdominal pain 12/04/2015  . Belching 12/04/2015  . Abdominal discomfort 04/08/2015  . Hypercalcemia 07/11/2013  . Sweating 07/05/2013  . Other malaise and fatigue 07/05/2013  . Hypocalcemia 07/05/2013  . Palpitations 07/05/2013  . Low blood pressure reading 07/05/2013  . Pes planus (flat feet) 03/29/2013  . Numbness of toes 03/29/2013  . Toe pain 03/29/2013  . History of PSVT (paroxysmal supraventricular tachycardia) 06/24/2012  . Contact dermatitis 06/23/2012  . Hypokalemia 06/23/2012  . Intertrigo 06/23/2012  . Hx of cardiac catheterization   . Positive PPD   . Multinodular goiter 05/12/2012  . Sinus tachycardia 03/09/2012  . SLEEP APNEA 01/19/2008  . GERD 01/18/2008  . DYSPEPSIA 01/18/2008  . CONSTIPATION 01/18/2008  . IRRITABLE BOWEL SYNDROME 01/18/2008    Past Surgical History:  Procedure Laterality Date  . CARDIAC CATHETERIZATION     x2 with normal results per pt  . NOVASURE ABLATION    . UPPER GASTROINTESTINAL ENDOSCOPY  May 2013    OB History    No data available       Home Medications    Prior to Admission medications   Medication Sig Start Date End Date Taking? Authorizing Provider  amoxicillin (AMOXIL) 500 MG capsule Take  1 capsule (500 mg total) by mouth 3 (three) times daily. 04/11/16   Orlie Dakin, MD  cholecalciferol (VITAMIN D) 1000 UNITS tablet Take 2,000 Units by mouth every morning.     Historical Provider, MD  metoprolol tartrate (LOPRESSOR) 25 MG tablet Take 25 mg by mouth 2 (two) times daily. 12/29/15   Historical Provider, MD  naproxen sodium (ANAPROX DS) 550 MG tablet Take 1 tablet (550 mg total) by mouth 2 (two) times daily with a meal. Patient not taking: Reported on 04/11/2016 02/21/16   Jaynee Eagles, PA-C  Omega-3 Fatty Acids (FISH OIL PO) Take  2 capsules by mouth every other day.     Historical Provider, MD  pantoprazole (PROTONIX) 40 MG tablet Take 40 mg by mouth daily as needed (acid reflux).     Historical Provider, MD  PARoxetine (PAXIL-CR) 25 MG 24 hr tablet Take 25 mg by mouth every morning.     Historical Provider, MD  potassium chloride SA (KLOR-CON M20) 20 MEQ tablet Take 1 tablet (20 mEq total) by mouth 2 (two) times daily. 08/21/15   Burnis Medin, MD  vitamin E 400 UNIT capsule Take 400 Units by mouth daily.    Historical Provider, MD    Family History Family History  Problem Relation Age of Onset  . Heart disease Father     died Mi 23  . Hypertension Maternal Grandfather   . Diabetes Maternal Grandfather   . Heart disease Maternal Grandfather   . Colon cancer Maternal Grandfather   . Diabetes Maternal Grandmother     Social History Social History  Substance Use Topics  . Smoking status: Never Smoker  . Smokeless tobacco: Never Used  . Alcohol use No     Allergies   Ceftriaxone sodium and Doxycycline   Review of Systems Review of Systems  All other systems reviewed and are negative.    Physical Exam Updated Vital Signs BP 113/63 (BP Location: Left Arm)   Pulse 62   Temp 97.9 F (36.6 C) (Oral)   Resp 20   Ht 5\' 6"  (1.676 m)   Wt 89.4 kg   LMP 06/29/2016 (Exact Date)   SpO2 100%   BMI 31.80 kg/m   Physical Exam  Constitutional: She is oriented to person, place, and time. She appears well-developed and well-nourished. No distress.  Eyes: Pupils are equal, round, and reactive to light. Right eye exhibits no discharge. Left eye exhibits no discharge. No scleral icterus.  Cardiovascular:  Pulses:      Radial pulses are 2+ on the right side, and 2+ on the left side.       Dorsalis pedis pulses are 2+ on the right side, and 2+ on the left side.  Pulmonary/Chest: No respiratory distress.  Abdominal: There is no CVA tenderness.  Musculoskeletal: Normal range of motion.  No L-spine or T-spine  midline tenderness. She has left paraspinal L-spine tenderness to palpation. Radiates down to left gluteus maximus and into the left lateral thigh. She has full range of motion. Positive straight leg test on the left. Able to ambulate. Cap refill normal. DP pulses are 2+ bilaterally. Strength is 5 out of 5 in lower extremities bilaterally.  Neurological: She is alert and oriented to person, place, and time. GCS eye subscore is 4. GCS verbal subscore is 5. GCS motor subscore is 6.  The patient is alert, attentive, and oriented x 3. Speech is clear. Cranial nerve II-VII grossly intact. Negative pronator drift. Sensation intact to sharp and dull  and proprioception in upper and lower extremities bilat. Strength 5/5 in all extremities. Reflexes 2+ and symmetric at biceps, triceps, knees, and ankles. Rapid alternating movement and fine finger movements intact. Posture and gait normal.   Skin: Skin is warm and dry. Capillary refill takes less than 2 seconds. No pallor.  Nursing note and vitals reviewed.    ED Treatments / Results  Labs (all labs ordered are listed, but only abnormal results are displayed) Labs Reviewed - No data to display  EKG  EKG Interpretation None       Radiology Dg Lumbar Spine Complete  Result Date: 07/30/2016 CLINICAL DATA:  Nontraumatic low back pain radiating down the left leg. EXAM: LUMBAR SPINE - COMPLETE 4+ VIEW COMPARISON:  07/19/2011 FINDINGS: The lumbar vertebrae are normal in height. There is good preservation of intervertebral disc spaces, with only mild degenerative lumbar disc disease at L3-4. Mild facet arthropathy is present, greatest at L3-4 and L4-5. No fracture or other acute abnormality is evident. Sacroiliac joints are unremarkable. IMPRESSION: Mild degenerative disc and facet disease. No acute findings are evident. Electronically Signed   By: Andreas Newport M.D.   On: 07/30/2016 06:54   Dg Hip Unilat With Pelvis 2-3 Views Left  Result Date:  07/30/2016 CLINICAL DATA:  Sudden onset nontraumatic left hip pain tonight. EXAM: DG HIP (WITH OR WITHOUT PELVIS) 2-3V LEFT COMPARISON:  None. FINDINGS: There is no evidence of hip fracture or dislocation. There is no evidence of arthropathy or other focal bone abnormality. IMPRESSION: Negative. Electronically Signed   By: Andreas Newport M.D.   On: 07/30/2016 02:49    Procedures Procedures (including critical care time)  Medications Ordered in ED Medications - No data to display   Initial Impression / Assessment and Plan / ED Course  I have reviewed the triage vital signs and the nursing notes.  Pertinent labs & imaging results that were available during my care of the patient were reviewed by me and considered in my medical decision making (see chart for details).  Clinical Course   Patient with back pain along with sciatic pain.  This seem to be a chronic issue. No neurological deficits and normal neuro exam.  Patient can walk but states is painful.  No loss of bowel or bladder control.  No concern for cauda equina.  No fever, night sweats, weight loss, h/o cancer, IVDU. Offered patient a Toradol shot but she declined. States that she would just like some naproxen and she will follow-up with her primary care doctor this week for outpatient MRI. I do feel like patient needs an MRI in the outpatient setting. No signs of urinary symptoms or flank pain. Xray of lumbar spine with chronic changes. Discussed strict return precautions for patient including red flag symptoms. RICE protocol and pain medicine indicated and discussed with patient. Pt is hemodynamically stable, in NAD, & able to ambulate in the ED. Pain has been managed & has no complaints prior to dc. Pt is comfortable with above plan and is stable for discharge at this time. All questions were answered prior to disposition. Strict return precautions for f/u to the ED were discussed.    Final Clinical Impressions(s) / ED Diagnoses    Final diagnoses:  Chronic left-sided low back pain with left-sided sciatica    New Prescriptions New Prescriptions   NAPROXEN (NAPROSYN) 500 MG TABLET    Take 1 tablet (500 mg total) by mouth 2 (two) times daily.     Doristine Devoid, PA-C  07/30/16 [id number]    Varney Biles, MD 07/30/16 2122

## 2016-07-30 NOTE — ED Triage Notes (Signed)
Patient comes in with complaints of a new acute pain in her left hip.  States she has some tingling in her left foot as well. Pedal pulses intact upon assessment. Denies injury.

## 2016-07-30 NOTE — ED Notes (Signed)
Pt is c/o left hip  Dull pain 6/10  radiating down left leg to her toes causing her toes to feel numb x 1 day.

## 2016-08-02 ENCOUNTER — Encounter: Payer: Self-pay | Admitting: Internal Medicine

## 2016-08-02 ENCOUNTER — Telehealth: Payer: Self-pay | Admitting: Internal Medicine

## 2016-08-02 ENCOUNTER — Ambulatory Visit: Payer: BLUE CROSS/BLUE SHIELD | Admitting: Family Medicine

## 2016-08-02 ENCOUNTER — Ambulatory Visit (INDEPENDENT_AMBULATORY_CARE_PROVIDER_SITE_OTHER): Payer: BLUE CROSS/BLUE SHIELD | Admitting: Internal Medicine

## 2016-08-02 VITALS — BP 116/70 | Temp 98.0°F | Wt 203.2 lb

## 2016-08-02 DIAGNOSIS — M5432 Sciatica, left side: Secondary | ICD-10-CM

## 2016-08-02 DIAGNOSIS — M5442 Lumbago with sciatica, left side: Secondary | ICD-10-CM

## 2016-08-02 MED ORDER — PREDNISONE 20 MG PO TABS: ORAL_TABLET | ORAL | 0 refills | Status: DC

## 2016-08-02 NOTE — Patient Instructions (Addendum)
I agree you have a pinched nerve in your back going down your leg. Fortunately you don't have signs of weakness or other alarming findings. Trial of prednisone a stronger anti-inflammatory it isn't a pain pill but does decrease inflammation. Plan referral to Dr. Tamala Julian for follow-up and further advice for rehabilitation of your back. You will be contacted about this appt .  If you get fever weakness in your leg asked to be seen right away. Imaging studies are reasonable preoperatively or when things aren't getting better.    Sciatica Sciatica is pain, numbness, weakness, or tingling along the path of the sciatic nerve. The sciatic nerve starts in the lower back and runs down the back of each leg. The nerve controls the muscles in the lower leg and in the back of the knee. It also provides feeling (sensation) to the back of the thigh, the lower leg, and the sole of the foot. Sciatica is a symptom of another medical condition that pinches or puts pressure on the sciatic nerve. Generally, sciatica only affects one side of the body. Sciatica usually goes away on its own or with treatment. In some cases, sciatica may keep coming back (recur). What are the causes? This condition is caused by pressure on the sciatic nerve, or pinching of the sciatic nerve. This may be the result of:  A disk in between the bones of the spine (vertebrae) bulging out too far (herniated disk).  Age-related changes in the spinal disks (degenerative disk disease).  A pain disorder that affects a muscle in the buttock (piriformis syndrome).  Extra bone growth (bone spur) near the sciatic nerve.  An injury or break (fracture) of the pelvis.  Pregnancy.  Tumor (rare). What increases the risk? The following factors may make you more likely to develop this condition:  Playing sports that place pressure or stress on the spine, such as football or weight lifting.  Having poor strength and flexibility.  A history of back  injury.  A history of back surgery.  Sitting for long periods of time.  Doing activities that involve repetitive bending or lifting.  Obesity. What are the signs or symptoms? Symptoms can vary from mild to very severe, and they may include:  Any of these problems in the lower back, leg, hip, or buttock:  Mild tingling or dull aches.  Burning sensations.  Sharp pains.  Numbness in the back of the calf or the sole of the foot.  Leg weakness.  Severe back pain that makes movement difficult. These symptoms may get worse when you cough, sneeze, or laugh, or when you sit or stand for long periods of time. Being overweight may also make symptoms worse. In some cases, symptoms may recur over time. How is this diagnosed? This condition may be diagnosed based on:  Your symptoms.  A physical exam. Your health care provider may ask you to do certain movements to check whether those movements trigger your symptoms.  You may have tests, including:  Blood tests.  X-rays.  MRI.  CT scan. How is this treated? In many cases, this condition improves on its own, without any treatment. However, treatment may include:  Reducing or modifying physical activity during periods of pain.  Exercising and stretching to strengthen your abdomen and improve the flexibility of your spine.  Icing and applying heat to the affected area.  Medicines that help:  To relieve pain and swelling.  To relax your muscles.  Injections of medicines that help to relieve pain,  irritation, and inflammation around the sciatic nerve (steroids).  Surgery. Follow these instructions at home: Medicines  Take over-the-counter and prescription medicines only as told by your health care provider.  Do not drive or operate heavy machinery while taking prescription pain medicine. Managing pain  If directed, apply ice to the affected area.  Put ice in a plastic bag.  Place a towel between your skin and the  bag.  Leave the ice on for 20 minutes, 2-3 times a day.  After icing, apply heat to the affected area before you exercise or as often as told by your health care provider. Use the heat source that your health care provider recommends, such as a moist heat pack or a heating pad.  Place a towel between your skin and the heat source.  Leave the heat on for 20-30 minutes.  Remove the heat if your skin turns bright red. This is especially important if you are unable to feel pain, heat, or cold. You may have a greater risk of getting burned. Activity  Return to your normal activities as told by your health care provider. Ask your health care provider what activities are safe for you.  Avoid activities that make your symptoms worse.  Take brief periods of rest throughout the day. Resting in a lying or standing position is usually better than sitting to rest.  When you rest for longer periods, mix in some mild activity or stretching between periods of rest. This will help to prevent stiffness and pain.  Avoid sitting for long periods of time without moving. Get up and move around at least one time each hour.  Exercise and stretch regularly, as told by your health care provider.  Do not lift anything that is heavier than 10 lb (4.5 kg) while you have symptoms of sciatica. When you do not have symptoms, you should still avoid heavy lifting, especially repetitive heavy lifting.  When you lift objects, always use proper lifting technique, which includes:  Bending your knees.  Keeping the load close to your body.  Avoiding twisting. General instructions  Use good posture.  Avoid leaning forward while sitting.  Avoid hunching over while standing.  Maintain a healthy weight. Excess weight puts extra stress on your back and makes it difficult to maintain good posture.  Wear supportive, comfortable shoes. Avoid wearing high heels.  Avoid sleeping on a mattress that is too soft or too  hard. A mattress that is firm enough to support your back when you sleep may help to reduce your pain.  Keep all follow-up visits as told by your health care provider. This is important. Contact a health care provider if:  You have pain that wakes you up when you are sleeping.  You have pain that gets worse when you lie down.  Your pain is worse than you have experienced in the past.  Your pain lasts longer than 4 weeks.  You experience unexplained weight loss. Get help right away if:  You lose control of your bowel or bladder (incontinence).  You have:  Weakness in your lower back, pelvis, buttocks, or legs that gets worse.  Redness or swelling of your back.  A burning sensation when you urinate. This information is not intended to replace advice given to you by your health care provider. Make sure you discuss any questions you have with your health care provider. Document Released: 08/24/2001 Document Revised: 02/03/2016 Document Reviewed: 05/09/2015 Elsevier Interactive Patient Education  2017 [employer].

## 2016-08-02 NOTE — Progress Notes (Signed)
Pre visit review using our clinic review tool, if applicable. No additional management support is needed unless otherwise documented below in the visit note.  Chief Complaint  Patient presents with  . Follow-up    Low back and sciatic pain.    HPI: [name] 55 y.o.   Fu ed visit   frequent visits to ed for various  Problems    palpitations  Chills   Last time  Nov 17left sided low back pain     Also family urgent ccare in past year    Last visit here was 53 16 PV.  Patient states that she has been having ongoing low back pain midline for a number of years. However the day she went to the emergency room she had woken up after a long sleep on the couch in a funny position and her left leg had pain shooting down to tingling in the toes. Had difficulty moving her leg to get up but no specific weakness or falling. She went to the emergency room and they gave her Aleve with some help there was some reference to getting a scan or an MRI as an outpatient. Currently she doesn't have weakness or bowel or bladder changes. Denies any trauma. The pain is worse when sitting long periods of time and also when she picks up her leg to move it abduction did she has difficulty getting in and out of the car. She's had some tingling in parts of her left foot but no total numbness. This was concerning her as if she was doing damage to her nerve in her legs. She has an business and drives a lot.  No fever or chills bowel or bladder control changes. See ed visit  Ros eval   ROS: See pertinent positives and negatives per HPI.  Clinical Course   Patient with back pain along with sciatic pain.  This seem to be a chronic issue. No neurological deficits and normal neuro exam.  Patient can walk but states is painful.  No loss of bowel or bladder control.  No concern for cauda equina.  No fever, night sweats, weight loss, h/o cancer, IVDU. Offered patient a Toradol shot but she declined. States that she would  just like some naproxen and she will follow-up with her primary care doctor this week for outpatient MRI. I do feel like patient needs an MRI in the outpatient setting. No signs of urinary symptoms or flank pain. Xray of lumbar spine with chronic changes. Discussed strict return precautions for patient including red flag symptoms. RICE protocol and pain medicine indicated and discussed with patient. Pt is hemodynamically stable, in NAD, & able to ambulate in the ED. Pain has been managed & has no complaints prior to dc. Pt is comfortable with above plan and is stable for discharge at this time. All questions were answered prior to disposition. Strict return precautions for f/u to the ED were discussed.   Past Medical History:  Diagnosis Date  . Anemia   . Colon polyp 08/28/2012   Tubular adenoma  . Dense breast   . Genital warts   . GERD (gastroesophageal reflux disease)   . Hemorrhoids   . Hx of cardiac catheterization 2009   normal coronary arteries  . Hx of menorrhagia    Novasure  . Hypokalemia   . Hypomagnesemia   . Hypophosphatemia   . Lactose intolerance in adult   . Mitral valve prolapse   . Normal cardiac stress test 03/09/2012  no ischemia  . Positive PPD   . Rapid heart beat    States "irreg heart beat"  . Thyroid disease    Thyroid nodules    Family History  Problem Relation Age of Onset  . Heart disease Father     died Mi 74  . Hypertension Maternal Grandfather   . Diabetes Maternal Grandfather   . Heart disease Maternal Grandfather   . Colon cancer Maternal Grandfather   . Diabetes Maternal Grandmother     Social History   Social History  . Marital status: Divorced    Spouse name: N/A  . Number of children: 2  . Years of education: 16   Occupational History  . dialysis tech [employer]   Social History Main Topics  . Smoking status: Never Smoker  . Smokeless tobacco: Never Used  . Alcohol use No  . Drug use: No  . Sexual activity:  Yes   Other Topics Concern  . None   Social History Narrative   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 She lived in [place] and in [place] for a number of years her mom is from [place]. 16 years of education went to college at [employer] lives at home with her son who is in his 10s no pets   Neg ets tob etoh hx PA   6 hours of sleep   G2P2    TD2010  colonoscopy 2009   Now running  own business    [employer] of 2           Outpatient Medications Prior to Visit  Medication Sig Dispense Refill  . cholecalciferol (VITAMIN D) 1000 UNITS tablet Take 2,000 Units by mouth every morning.     . metoprolol tartrate (LOPRESSOR) 25 MG tablet Take 25 mg by mouth 2 (two) times daily.  6  . naproxen (NAPROSYN) 500 MG tablet Take 1 tablet (500 mg total) by mouth 2 (two) times daily. 14 tablet 0  . Omega-3 Fatty Acids (FISH OIL PO) Take 2 capsules by mouth every other day.     . pantoprazole (PROTONIX) 40 MG tablet Take 40 mg by mouth daily as needed (acid reflux).     [name] PARoxetine (PAXIL-CR) 25 MG 24 hr tablet Take 25 mg by mouth every morning.     . potassium chloride SA (KLOR-CON M20) 20 MEQ tablet Take 1 tablet (20 mEq total) by mouth 2 (two) times daily. 180 tablet 3  . vitamin E 400 UNIT capsule Take 400 Units by mouth daily.    [name] amoxicillin (AMOXIL) 500 MG capsule Take 1 capsule (500 mg total) by mouth 3 (three) times daily. 15 capsule 0  . naproxen sodium (ANAPROX DS) 550 MG tablet Take 1 tablet (550 mg total) by mouth 2 (two) times daily with a meal. (Patient not taking: Reported on 04/11/2016) 30 tablet 1   Facility-Administered Medications Prior to Visit  Medication Dose Route Frequency Provider Last Rate Last Dose  . nitroGLYCERIN (NITROSTAT) SL tablet 0.3 mg  0.3 mg Sublingual Q5 min PRN Tereasa Coop, PA-C   0.3 mg at 11/22/15 0951     EXAM:  BP 116/70 (BP  Location: Left Arm, Patient Position: Sitting, Cuff Size: Large)   Temp 98 F (36.7 C) (Oral)   Wt 203 lb 3.2 oz (92.2 kg)   LMP 06/29/2016 (Exact Date)   BMI 32.80  kg/m   Body mass index is 32.8 kg/m.  GENERAL: vitals reviewed and listed above, alert, oriented, appears well hydrated and in no acute distressGait independent prefers to sit with her leg out some difficulty with pain when abduction seeing sitting. HEENT: atraumatic, conjunctiva  clear, no obvious abnormalities on inspection of external nose and ears  NECK: no obvious masses on inspection palpation   LUNGS: clear to auscultation bilaterally, no wheezes, rales or rhonchi, CV: HRRR, no clubbing cyanosis or  peripheral edema nl cap refill  MS: moves all extremities without noticeable focal  Abnormality Discomfort localized to the low LS area left leg without any weakness toe heel walk is normal. DTRs are brisk without clonus but equal bilaterally. Question SLR versus other manipulation lateral hip rotation with pain. (Complains of buttocks pain.) PSYCH: pleasant and cooperative, no obvious depression or anxiety  Lab Results  Component Value Date   WBC 7.2 02/21/2016   HGB 13.1 02/21/2016   HCT 39.8 02/21/2016   PLT 195 01/10/2016   GLUCOSE 101 (H) 01/10/2016   CHOL 129 08/19/2015   TRIG 59.0 08/19/2015   HDL 46.20 08/19/2015   LDLCALC 71 08/19/2015   ALT 18 11/22/2015   AST 23 11/22/2015   NA 140 01/10/2016   K 4.2 01/10/2016   CL 109 01/10/2016   CREATININE 0.77 01/10/2016   BUN 12 01/10/2016   CO2 24 01/10/2016   TSH 1.232 01/10/2016   INR 0.95 03/10/2012   HGBA1C 5.9 08/19/2015   BP Readings from Last 3 Encounters:  08/02/16 116/70  07/30/16 (!) 107/53  04/11/16 124/60    ASSESSMENT AND PLAN:  Discussed the following assessment and plan:  Left-sided low back pain with left-sided sciatica, unspecified chronicity - Plan: Ambulatory referral to Sports Medicine  Left sciatic notch pain - Plan:  Ambulatory referral to Sports Medicine Back pain ongoing with recent left radiation down leg worsening with lateral rotation of the hip. No alarm findings such as fever or weakness in her lower extremities. Her pain is slightly better than it was at onset the naproxen enteric uses of prednisone discussed possible imaging but I prefer to do referral to sports medicine first and follow-up. Discussed alarm symptoms with her for urgent evaluation. Prednisone taper in the meantime.-Patient advised to return or notify health care team  if symptoms worsen ,persist or new concerns arise. Total visit 43mins > 50% spent counseling and coordinating care as indicated in above note and in instructions to patient . Review of evaluation and data    Patient Instructions  I agree you have a pinched nerve in your back going down your leg. Fortunately you don't have signs of weakness or other alarming findings. Trial of prednisone a stronger anti-inflammatory it isn't a pain pill but does decrease inflammation. Plan referral to Dr. Tamala Julian for follow-up and further advice for rehabilitation of your back. You will be contacted about this appt .  If you get fever weakness in your leg asked to be seen right away. Imaging studies are reasonable preoperatively or when things aren't getting better.    Sciatica Sciatica is pain, numbness, weakness, or tingling along the path of the sciatic nerve. The sciatic nerve starts in the lower back and runs down the back of each leg. The nerve controls the muscles in the lower leg and in the back of the knee. It also provides feeling (sensation) to the back of the thigh, the lower leg, and the sole of the foot. Sciatica is a symptom  of another medical condition that pinches or puts pressure on the sciatic nerve. Generally, sciatica only affects one side of the body. Sciatica usually goes away on its own or with treatment. In some cases, sciatica may keep coming back (recur). What are  the causes? This condition is caused by pressure on the sciatic nerve, or pinching of the sciatic nerve. This may be the result of:  A disk in between the bones of the spine (vertebrae) bulging out too far (herniated disk).  Age-related changes in the spinal disks (degenerative disk disease).  A pain disorder that affects a muscle in the buttock (piriformis syndrome).  Extra bone growth (bone spur) near the sciatic nerve.  An injury or break (fracture) of the pelvis.  Pregnancy.  Tumor (rare). What increases the risk? The following factors may make you more likely to develop this condition:  Playing sports that place pressure or stress on the spine, such as football or weight lifting.  Having poor strength and flexibility.  A history of back injury.  A history of back surgery.  Sitting for long periods of time.  Doing activities that involve repetitive bending or lifting.  Obesity. What are the signs or symptoms? Symptoms can vary from mild to very severe, and they may include:  Any of these problems in the lower back, leg, hip, or buttock:  Mild tingling or dull aches.  Burning sensations.  Sharp pains.  Numbness in the back of the calf or the sole of the foot.  Leg weakness.  Severe back pain that makes movement difficult. These symptoms may get worse when you cough, sneeze, or laugh, or when you sit or stand for long periods of time. Being overweight may also make symptoms worse. In some cases, symptoms may recur over time. How is this diagnosed? This condition may be diagnosed based on:  Your symptoms.  A physical exam. Your health care provider may ask you to do certain movements to check whether those movements trigger your symptoms.  You may have tests, including:  Blood tests.  X-rays.  MRI.  CT scan. How is this treated? In many cases, this condition improves on its own, without any treatment. However, treatment may include:  Reducing or  modifying physical activity during periods of pain.  Exercising and stretching to strengthen your abdomen and improve the flexibility of your spine.  Icing and applying heat to the affected area.  Medicines that help:  To relieve pain and swelling.  To relax your muscles.  Injections of medicines that help to relieve pain, irritation, and inflammation around the sciatic nerve (steroids).  Surgery. Follow these instructions at home: Medicines  Take over-the-counter and prescription medicines only as told by your health care provider.  Do not drive or operate heavy machinery while taking prescription pain medicine. Managing pain  If directed, apply ice to the affected area.  Put ice in a plastic bag.  Place a towel between your skin and the bag.  Leave the ice on for 20 minutes, 2-3 times a day.  After icing, apply heat to the affected area before you exercise or as often as told by your health care provider. Use the heat source that your health care provider recommends, such as a moist heat pack or a heating pad.  Place a towel between your skin and the heat source.  Leave the heat on for 20-30 minutes.  Remove the heat if your skin turns bright red. This is especially important if you are unable  to feel pain, heat, or cold. You may have a greater risk of getting burned. Activity  Return to your normal activities as told by your health care provider. Ask your health care provider what activities are safe for you.  Avoid activities that make your symptoms worse.  Take brief periods of rest throughout the day. Resting in a lying or standing position is usually better than sitting to rest.  When you rest for longer periods, mix in some mild activity or stretching between periods of rest. This will help to prevent stiffness and pain.  Avoid sitting for long periods of time without moving. Get up and move around at least one time each hour.  Exercise and stretch regularly,  as told by your health care provider.  Do not lift anything that is heavier than 10 lb (4.5 kg) while you have symptoms of sciatica. When you do not have symptoms, you should still avoid heavy lifting, especially repetitive heavy lifting.  When you lift objects, always use proper lifting technique, which includes:  Bending your knees.  Keeping the load close to your body.  Avoiding twisting. General instructions  Use good posture.  Avoid leaning forward while sitting.  Avoid hunching over while standing.  Maintain a healthy weight. Excess weight puts extra stress on your back and makes it difficult to maintain good posture.  Wear supportive, comfortable shoes. Avoid wearing high heels.  Avoid sleeping on a mattress that is too soft or too hard. A mattress that is firm enough to support your back when you sleep may help to reduce your pain.  Keep all follow-up visits as told by your health care provider. This is important. Contact a health care provider if:  You have pain that wakes you up when you are sleeping.  You have pain that gets worse when you lie down.  Your pain is worse than you have experienced in the past.  Your pain lasts longer than 4 weeks.  You experience unexplained weight loss. Get help right away if:  You lose control of your bowel or bladder (incontinence).  You have:  Weakness in your lower back, pelvis, buttocks, or legs that gets worse.  Redness or swelling of your back.  A burning sensation when you urinate. This information is not intended to replace advice given to you by your health care provider. Make sure you discuss any questions you have with your health care provider. Document Released: 08/24/2001 Document Revised: 02/03/2016 Document Reviewed: 05/09/2015 Elsevier Interactive Patient Education  2017 [employer]. Pranish Akhavan M.D.

## 2016-08-04 NOTE — Telephone Encounter (Signed)
Error

## 2016-08-19 NOTE — Progress Notes (Deleted)
[name] [place], [place] [address] Phone: [phone] Subjective:    I'm seeing this patient by the request  of:    CC: Low back pain  [id number]  [name] is a 55 y.o. female coming in with complaint of low back pain. Seems to have a chronic left-sided low back pain. Has been seen by multiple people and have even gone to the hospital on occasion. Saw primary care provider 3 weeks ago. Patient seemed to be having pain that seemed to be radiating down the low back and going all way down the posterior aspect of the leg. Seems to be more of a recent pain though is worsening. Patient was given anti-inflammatories as well as prednisone. Patient states   x-rays were taken 07/30/2016 in the were independently visualized by me. Pictures lumbar spine showed mild facet arthropathy from L3-L5 Patient also had x-rays of the left hip that were unremarkable for no bony normality.  Past Medical History:  Diagnosis Date  . Anemia   . Colon polyp 08/28/2012   Tubular adenoma  . Dense breast   . Genital warts   . GERD (gastroesophageal reflux disease)   . Hemorrhoids   . Hx of cardiac catheterization 2009   normal coronary arteries  . Hx of menorrhagia    Novasure  . Hypokalemia   . Hypomagnesemia   . Hypophosphatemia   . Lactose intolerance in adult   . Mitral valve prolapse   . Normal cardiac stress test 03/09/2012   no ischemia  . Positive PPD   . Rapid heart beat    States "irreg heart beat"  . Thyroid disease    Thyroid nodules   Past Surgical History:  Procedure Laterality Date  . CARDIAC CATHETERIZATION     x2 with normal results per pt  . NOVASURE ABLATION    . UPPER GASTROINTESTINAL ENDOSCOPY  May 2013   Social History   Social History  . Marital status: Divorced    Spouse name: N/A  . Number of children: 2  . Years of education: 16   Occupational History  . 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 Smoking status: Never Smoker  . Smokeless tobacco: Never Used  . Alcohol use No  . Drug use: No  . Sexual activity: Yes   Other Topics Concern  . 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 She lived in [place] and in [place] for a number of years her mom is from [place]. 16 years of education went to college at [employer] lives at home with her son who is in his 34s no pets   Neg ets tob etoh hx PA   6 hours of sleep   G2P2    TD2010  colonoscopy 2009   Now running  own business    HH of 2          Allergies  Allergen Reactions  . Ceftriaxone Sodium Itching  . Doxycycline Rash   Family History  Problem Relation Age of Onset  . Heart disease Father     died Mi 29  . Hypertension Maternal Grandfather   . Diabetes Maternal Grandfather   . Heart disease Maternal Grandfather   . Colon cancer Maternal Grandfather   . Diabetes  Maternal Grandmother     Past medical history, social, surgical and family history all reviewed in electronic medical record.  No pertanent information unless stated regarding to the chief complaint.   Review of Systems:Review of systems updated and as accurate as of 08/19/16  No headache, visual changes, nausea, vomiting, diarrhea, constipation, dizziness, abdominal pain, skin rash, fevers, chills, night sweats, weight loss, swollen lymph nodes, body aches, joint swelling, muscle aches, chest pain, shortness of breath, mood changes.   Objective  Last menstrual period 06/29/2016. Systems examined below as of 08/19/16   General: No apparent distress alert and oriented x3 mood and affect normal, dressed appropriately.  HEENT: Pupils equal, extraocular movements intact  Respiratory: Patient's speak in full sentences and does not appear short of breath  Cardiovascular: No  lower extremity edema, non tender, no erythema  Skin: Warm dry intact with no signs of infection or rash on extremities or on axial skeleton.  Abdomen: Soft nontender  Neuro: Cranial nerves II through XII are intact, neurovascularly intact in all extremities with 2+ DTRs and 2+ pulses.  Lymph: No lymphadenopathy of posterior or anterior cervical chain or axillae bilaterally.  Gait normal with good balance and coordination.  MSK:  Non tender with full range of motion and good stability and symmetric strength and tone of shoulders, elbows, wrist, hip, knee and ankles bilaterally.  Back Exam:  Inspection: Unremarkable  Motion: Flexion 45 deg, Extension 45 deg, Side Bending to 45 deg bilaterally,  Rotation to 45 deg bilaterally  SLR laying: Negative  XSLR laying: Negative  Palpable tenderness: None. FABER: negative. Sensory change: Gross sensation intact to all lumbar and sacral dermatomes.  Reflexes: 2+ at both patellar tendons, 2+ at achilles tendons, Babinski's downgoing.  Strength at foot  Plantar-flexion: 5/5 Dorsi-flexion: 5/5 Eversion: 5/5 Inversion: 5/5  Leg strength  Quad: 5/5 Hamstring: 5/5 Hip flexor: 5/5 Hip abductors: 5/5  Gait unremarkable.    Impression and Recommendations:     This case required medical decision making of moderate complexity.      Note: This dictation was prepared with Dragon dictation along with smaller phrase technology. Any transcriptional errors that result from this process are unintentional.

## 2016-08-20 ENCOUNTER — Ambulatory Visit: Payer: BLUE CROSS/BLUE SHIELD | Admitting: Family Medicine

## 2016-08-31 ENCOUNTER — Ambulatory Visit
Admission: RE | Admit: 2016-08-31 | Discharge: 2016-08-31 | Disposition: A | Discharge status: Home / Self Care | Payer: BLUE CROSS/BLUE SHIELD | Source: Ambulatory Visit | Attending: Obstetrics and Gynecology | Admitting: Obstetrics and Gynecology

## 2016-08-31 DIAGNOSIS — Z1231 Encounter for screening mammogram for malignant neoplasm of breast: Secondary | ICD-10-CM

## 2016-09-14 ENCOUNTER — Other Ambulatory Visit: Payer: Self-pay | Admitting: Internal Medicine

## 2016-09-16 ENCOUNTER — Telehealth: Payer: Self-pay | Admitting: Family Medicine

## 2016-09-16 ENCOUNTER — Other Ambulatory Visit: Payer: Self-pay | Admitting: Family Medicine

## 2016-09-16 DIAGNOSIS — Z0000 Encounter for general adult medical examination without abnormal findings: Secondary | ICD-10-CM

## 2016-09-16 NOTE — Telephone Encounter (Signed)
Pt due for cpx and lab work.  I have placed the lab work orders.  Please help the pt to make both appointments.  Thanks!!

## 2016-09-16 NOTE — Telephone Encounter (Signed)
Ordered for 3 months.  Had lab work in ED on 01/10/16.  Potassium normal.  Pt is past due for lab work and cpx.  Lab orders placed and message sent to scheduling.

## 2016-09-17 NOTE — Telephone Encounter (Signed)
LMOM FOR pt to callback

## 2016-09-21 NOTE — Telephone Encounter (Signed)
Pt would like to go to [facility] for have cpx labs. Please put order in system

## 2016-09-21 NOTE — Telephone Encounter (Signed)
Pt notified to go to [facility] for lab work.

## 2016-09-27 NOTE — Progress Notes (Deleted)
[facility] [place] [place], [place] [address] Phone: [phone] Subjective:    I'm seeing this patient by the request  of:  Lottie Dawson, MD   CC: Low back pain and left-sided  [id number]  [name] is a 56 y.o. female coming in with complaint of Left-sided low back pain. Patient states that this pain significantly unrelenting. More of a chronic issue. No weakness but the patient does state that there is some radiation going down the legs. Denies any bowel or bladder incontinence. Denies any fevers, chills, any abnormal weight loss. His been taking over-the-counter anti-inflammatories with very minimal benefit.   patient did have x-rays of the lumbar spine 07/30/2016. This was inability visualized by me showing mild degenerative disc disease at multiple levels especially L3-L5. Patient also having bilateral hip x-rays were unremarkable for any bony normality. Past Medical History:  Diagnosis Date  . Anemia   . Colon polyp 08/28/2012   Tubular adenoma  . Dense breast   . Genital warts   . GERD (gastroesophageal reflux disease)   . Hemorrhoids   . Hx of cardiac catheterization 2009   normal coronary arteries  . Hx of menorrhagia    Novasure  . Hypokalemia   . Hypomagnesemia   . Hypophosphatemia   . Lactose intolerance in adult   . Mitral valve prolapse   . Normal cardiac stress test 03/09/2012   no ischemia  . Positive PPD   . Rapid heart beat    States "irreg heart beat"  . Thyroid disease    Thyroid nodules   Past Surgical History:  Procedure Laterality Date  . CARDIAC CATHETERIZATION     x2 with normal results per pt  . NOVASURE ABLATION    . UPPER GASTROINTESTINAL ENDOSCOPY  May 2013   Social History   Social History  . Marital status: Divorced    Spouse name: N/A  . Number of children: 2  . Years of education: 16   Occupational History  . 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 Smoking status: Never Smoker  . Smokeless tobacco: Never Used  . Alcohol use No  . Drug use: No  . Sexual activity: Yes   Other Topics Concern  . Not on file   Social History Narrative   Ms. 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 She lived in [place] and in [place] for a number of years her mom is from [place]. 16 years of education went to college at [employer] lives at home with her son who is in his 61s no pets   Neg ets tob etoh hx PA   6 hours of sleep   G2P2    TD2010  colonoscopy 2009   Now running  own business    HH of 2          Allergies  Allergen Reactions  . Ceftriaxone Sodium Itching  . Doxycycline Rash   Family History  Problem Relation Age of Onset  . Heart disease Father     died Mi 60  . Hypertension Maternal Grandfather   . Diabetes Maternal Grandfather   . Heart disease Maternal Grandfather   . Colon cancer Maternal Grandfather   . Diabetes Maternal Grandmother     Past medical history, social, surgical and family history all reviewed in electronic medical record.  No pertanent information unless stated regarding to the chief complaint.   Review of Systems:Review of systems updated and as accurate as of 09/27/16  No headache, visual changes, nausea, vomiting, diarrhea, constipation, dizziness, abdominal pain, skin rash, fevers, chills, night sweats, weight loss, swollen lymph nodes, body aches, joint swelling, muscle aches, chest pain, shortness of breath, mood changes.   Objective  There were no vitals taken for this visit. Systems examined below as of 09/27/16   General: No apparent distress alert and oriented x3 mood and affect normal, dressed appropriately.  HEENT: Pupils equal, extraocular movements intact  Respiratory: Patient's speak in full sentences and does not appear short of breath  Cardiovascular: No lower  extremity edema, non tender, no erythema  Skin: Warm dry intact with no signs of infection or rash on extremities or on axial skeleton.  Abdomen: Soft nontender  Neuro: Cranial nerves II through XII are intact, neurovascularly intact in all extremities with 2+ DTRs and 2+ pulses.  Lymph: No lymphadenopathy of posterior or anterior cervical chain or axillae bilaterally.  Gait normal with good balance and coordination.  MSK:  Non tender with full range of motion and good stability and symmetric strength and tone of shoulders, elbows, wrist, hip, knee and ankles bilaterally.     Impression and Recommendations:     This case required medical decision making of moderate complexity.      Note: This dictation was prepared with Dragon dictation along with smaller phrase technology. Any transcriptional errors that result from this process are unintentional.

## 2016-09-28 ENCOUNTER — Ambulatory Visit: Payer: BLUE CROSS/BLUE SHIELD | Admitting: Family Medicine

## 2016-10-10 ENCOUNTER — Encounter (HOSPITAL_COMMUNITY): Payer: Self-pay

## 2016-10-10 ENCOUNTER — Ambulatory Visit (HOSPITAL_COMMUNITY)
Admission: EM | Admit: 2016-10-10 | Discharge: 2016-10-10 | Disposition: A | Discharge status: Home / Self Care | Payer: BLUE CROSS/BLUE SHIELD | Attending: Family Medicine | Admitting: Family Medicine

## 2016-10-10 DIAGNOSIS — R109 Unspecified abdominal pain: Secondary | ICD-10-CM

## 2016-10-10 LAB — POCT URINALYSIS DIP (DEVICE)
Bilirubin Urine: NEGATIVE
GLUCOSE, UA: NEGATIVE mg/dL
Ketones, ur: NEGATIVE mg/dL
Nitrite: NEGATIVE
PROTEIN: NEGATIVE mg/dL
Specific Gravity, Urine: 1.015 (ref 1.005–1.030)
UROBILINOGEN UA: 0.2 mg/dL (ref 0.0–1.0)
pH: 7 (ref 5.0–8.0)

## 2016-10-10 MED ORDER — SULFAMETHOXAZOLE-TRIMETHOPRIM 800-160 MG PO TABS: 1.0000 | ORAL_TABLET | Freq: Two times a day (BID) | ORAL | 0 refills | Status: DC

## 2016-10-10 NOTE — Discharge Instructions (Signed)
Start Bactrim twice a day for 7 days. May take Ibuprofen 600mg  every 8 hours as needed for pain. Increase fluid intake. Follow-up pending urine culture results.

## 2016-10-10 NOTE — ED Triage Notes (Signed)
Pt said her urine has a foul odor and flank pain since this morning. No dysuria, no fever.

## 2016-10-10 NOTE — ED Provider Notes (Signed)
CSN: [id number]     Arrival date & time 10/10/16  1437 History   First MD Initiated Contact with Patient 10/10/16 1637     Chief Complaint  Patient presents with  . Back Pain   (Consider location/radiation/quality/duration/timing/severity/associated sxs/prior Treatment) 56 year old female present with flank pain and foul-smelling urine that started this morning. Denies any fever, burning with urination, GI symptoms or vaginal discharge. Has not taken any medication for symptoms. Last UTI about 8 months ago in which she had no urinary symptoms except foul-smelling urine.    The history is provided by the patient.    Past Medical History:  Diagnosis Date  . Anemia   . Colon polyp 08/28/2012   Tubular adenoma  . Dense breast   . Genital warts   . GERD (gastroesophageal reflux disease)   . Hemorrhoids   . Hx of cardiac catheterization 2009   normal coronary arteries  . Hx of menorrhagia    Novasure  . Hypokalemia   . Hypomagnesemia   . Hypophosphatemia   . Lactose intolerance in adult   . Mitral valve prolapse   . Normal cardiac stress test 03/09/2012   no ischemia  . Positive PPD   . Rapid heart beat    States "irreg heart beat"  . Thyroid disease    Thyroid nodules   Past Surgical History:  Procedure Laterality Date  . CARDIAC CATHETERIZATION     x2 with normal results per pt  . NOVASURE ABLATION    . UPPER GASTROINTESTINAL ENDOSCOPY  May 2013   Family History  Problem Relation Age of Onset  . Heart disease Father     died Mi 25  . Hypertension Maternal Grandfather   . Diabetes Maternal Grandfather   . Heart disease Maternal Grandfather   . Colon cancer Maternal Grandfather   . Diabetes Maternal Grandmother    Social History  Substance Use Topics  . Smoking status: Never Smoker  . Smokeless tobacco: Never Used  . Alcohol use No   OB History    No data available     Review of Systems  Constitutional: Negative for chills, fatigue and fever.   Respiratory: Negative for cough, chest tightness and shortness of breath.   Cardiovascular: Negative for chest pain.  Gastrointestinal: Negative for abdominal pain, diarrhea, nausea and vomiting.  Genitourinary: Positive for flank pain and frequency. Negative for difficulty urinating, dysuria, hematuria, urgency, vaginal bleeding and vaginal discharge.  Musculoskeletal: Positive for back pain. Negative for myalgias.  Skin: Negative for rash.  Neurological: Negative for dizziness, syncope, weakness, light-headedness, numbness and headaches.  Hematological: Negative for adenopathy.    Allergies  Ceftriaxone sodium and Doxycycline  Home Medications   Prior to Admission medications   Medication Sig Start Date End Date Taking? Authorizing Provider  cholecalciferol (VITAMIN D) 1000 UNITS tablet Take 2,000 Units by mouth every morning.    Yes Historical Provider, MD  metoprolol tartrate (LOPRESSOR) 25 MG tablet Take 25 mg by mouth 2 (two) times daily. 12/29/15  Yes Historical Provider, MD  naproxen (NAPROSYN) 500 MG tablet Take 1 tablet (500 mg total) by mouth 2 (two) times daily. 07/30/16  Yes Doristine Devoid, PA-C  Omega-3 Fatty Acids (FISH OIL PO) Take 2 capsules by mouth every other day.    Yes Historical Provider, MD  PARoxetine (PAXIL-CR) 25 MG 24 hr tablet Take 25 mg by mouth every morning.    Yes Historical Provider, MD  potassium chloride SA (K-DUR,KLOR-CON) 20 MEQ tablet TAKE 1  TABLET(20 MEQ) BY MOUTH TWICE DAILY 09/16/16  Yes Burnis Medin, MD  vitamin E 400 UNIT capsule Take 400 Units by mouth daily.   Yes Historical Provider, MD  pantoprazole (PROTONIX) 40 MG tablet Take 40 mg by mouth daily as needed (acid reflux).     Historical Provider, MD  sulfamethoxazole-trimethoprim (BACTRIM DS,SEPTRA DS) 800-160 MG tablet Take 1 tablet by mouth 2 (two) times daily. 10/10/16 10/17/16  Katy Apo, NP   Meds Ordered and Administered this Visit  Medications - No data to display  BP  132/80 (BP Location: Right Arm)   Pulse 65   Temp 98.2 F (36.8 C) (Oral)   Resp 20   LMP 08/18/2016   SpO2 98%  No data found.   Physical Exam  Constitutional: She is oriented to person, place, and time. She appears well-developed and well-nourished. No distress.  Cardiovascular: Normal rate, regular rhythm and normal heart sounds.   Pulmonary/Chest: Effort normal and breath sounds normal. No respiratory distress. She has no wheezes.  Abdominal: Soft. Bowel sounds are normal. There is no hepatosplenomegaly. There is tenderness in the right lower quadrant and suprapubic area. There is CVA tenderness (right side). There is no rigidity, no rebound and no guarding.  Musculoskeletal: Normal range of motion.  Neurological: She is alert and oriented to person, place, and time.  Skin: Skin is warm and dry. Capillary refill takes less than 2 seconds.  Psychiatric: She has a normal mood and affect. Her behavior is normal. Judgment and thought content normal.    Urgent Care Course     Procedures (including critical care time)  Labs Review Labs Reviewed  POCT URINALYSIS DIP (DEVICE) - Abnormal; Notable for the following:       Result Value   Hgb urine dipstick TRACE (*)    Leukocytes, UA MODERATE (*)    All other components within normal limits  URINE CULTURE    Imaging Review No results found.   Visual Acuity Review  Right Eye Distance:   Left Eye Distance:   Bilateral Distance:    Right Eye Near:   Left Eye Near:    Bilateral Near:         MDM   1. Flank pain, acute    Reviewed urinalysis results and clinical findings with patient- may be a UTI/ early pyelonephritis. Will send urine for culture. Recommend start Bactrim DS twice a day as directed. Increase fluid intake. May take Ibuprofen 600mg  every 8 hours as needed for pain. Follow-up pending urine culture results or go to ER if symptoms worsen.      Katy Apo, NP 10/11/16 [phone]

## 2016-10-11 NOTE — Progress Notes (Deleted)
[facility] [address] [place], [place] [address] Phone: [phone] Subjective:    I'm seeing this patient by the request  of:  Lottie Dawson, MD   CC: Low back pain and left-sided  [id number]  [name] is a 56 y.o. female coming in with complaint of Left-sided low back pain. Patient states that this pain significantly unrelenting. More of a chronic issue. No weakness but the patient does state that there is some radiation going down the legs. Denies any bowel or bladder incontinence. Denies any fevers, chills, any abnormal weight loss. His been taking over-the-counter anti-inflammatories with very minimal benefit.   patient did have x-rays of the lumbar spine 07/30/2016. This was inability visualized by me showing mild degenerative disc disease at multiple levels especially L3-L5. Patient also having bilateral hip x-rays were unremarkable for any bony normality. Past Medical History:  Diagnosis Date  . Anemia   . Colon polyp 08/28/2012   Tubular adenoma  . Dense breast   . Genital warts   . GERD (gastroesophageal reflux disease)   . Hemorrhoids   . Hx of cardiac catheterization 2009   normal coronary arteries  . Hx of menorrhagia    Novasure  . Hypokalemia   . Hypomagnesemia   . Hypophosphatemia   . Lactose intolerance in adult   . Mitral valve prolapse   . Normal cardiac stress test 03/09/2012   no ischemia  . Positive PPD   . Rapid heart beat    States "irreg heart beat"  . Thyroid disease    Thyroid nodules   Past Surgical History:  Procedure Laterality Date  . CARDIAC CATHETERIZATION     x2 with normal results per pt  . NOVASURE ABLATION    . UPPER GASTROINTESTINAL ENDOSCOPY  May 2013   Social History   Social History  . Marital status: Divorced    Spouse name: N/A  . Number of children: 2  . 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 Smoking status: Never Smoker  . Smokeless tobacco: Never Used  . Alcohol use No  . Drug use: No  . Sexual activity: Yes   Other Topics Concern  . Not on file   Social History Narrative   Ms. 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 She lived in [place] and in [place] for a number of years her mom is from [place]. 16 years of education went to college at [employer] lives at home with her son who is in his 67s no pets   Neg ets tob etoh hx PA   6 hours of sleep   G2P2    TD2010  colonoscopy 2009   Now running  own business    HH of 2          Allergies  Allergen Reactions  . Ceftriaxone Sodium Itching  . Doxycycline Rash   Family History  Problem Relation Age of Onset  . Heart disease Father     died Mi 36  . Hypertension Maternal Grandfather   . Diabetes Maternal Grandfather   . Heart disease Maternal Grandfather   . Colon cancer Maternal Grandfather   . Diabetes Maternal Grandmother     Past medical history, social, surgical and family history all reviewed in electronic medical record.  No pertanent information unless stated regarding to the chief complaint.   Review of Systems:Review of systems updated and as accurate as of 10/11/16  No headache, visual changes, nausea, vomiting, diarrhea, constipation, dizziness, abdominal pain, skin rash, fevers, chills, night sweats, weight loss, swollen lymph nodes, body aches, joint swelling, muscle aches, chest pain, shortness of breath, mood changes.   Objective  There were no vitals taken for this visit. Systems examined below as of 10/11/16   General: No apparent distress alert and oriented x3 mood and affect normal, dressed appropriately.  HEENT: Pupils equal, extraocular movements intact  Respiratory: Patient's speak in full sentences and does not appear short of breath  Cardiovascular: No lower  extremity edema, non tender, no erythema  Skin: Warm dry intact with no signs of infection or rash on extremities or on axial skeleton.  Abdomen: Soft nontender  Neuro: Cranial nerves II through XII are intact, neurovascularly intact in all extremities with 2+ DTRs and 2+ pulses.  Lymph: No lymphadenopathy of posterior or anterior cervical chain or axillae bilaterally.  Gait normal with good balance and coordination.  MSK:  Non tender with full range of motion and good stability and symmetric strength and tone of shoulders, elbows, wrist, hip, knee and ankles bilaterally.  Back Exam:  Inspection: Unremarkable  Motion: Flexion 45 deg, Extension 45 deg, Side Bending to 45 deg bilaterally,  Rotation to 45 deg bilaterally  SLR laying: Negative  XSLR laying: Negative  Palpable tenderness: None. FABER: negative. Sensory change: Gross sensation intact to all lumbar and sacral dermatomes.  Reflexes: 2+ at both patellar tendons, 2+ at achilles tendons, Babinski's downgoing.  Strength at foot  Plantar-flexion: 5/5 Dorsi-flexion: 5/5 Eversion: 5/5 Inversion: 5/5  Leg strength  Quad: 5/5 Hamstring: 5/5 Hip flexor: 5/5 Hip abductors: 5/5  Gait unremarkable.    Impression and Recommendations:     This case required medical decision making of moderate complexity.      Note: This dictation was prepared with Dragon dictation along with smaller phrase technology. Any transcriptional errors that result from this process are unintentional.

## 2016-10-12 ENCOUNTER — Ambulatory Visit: Payer: BLUE CROSS/BLUE SHIELD | Admitting: Family Medicine

## 2016-10-13 ENCOUNTER — Telehealth (HOSPITAL_COMMUNITY): Payer: Self-pay | Admitting: Emergency Medicine

## 2016-10-13 ENCOUNTER — Emergency Department (HOSPITAL_COMMUNITY): Payer: BLUE CROSS/BLUE SHIELD

## 2016-10-13 ENCOUNTER — Emergency Department (HOSPITAL_COMMUNITY)
Admission: EM | Admit: 2016-10-13 | Discharge: 2016-10-13 | Disposition: A | Discharge status: Home / Self Care | Payer: BLUE CROSS/BLUE SHIELD | Attending: Emergency Medicine | Admitting: Emergency Medicine

## 2016-10-13 ENCOUNTER — Encounter (HOSPITAL_COMMUNITY): Payer: Self-pay | Admitting: Emergency Medicine

## 2016-10-13 DIAGNOSIS — R110 Nausea: Secondary | ICD-10-CM | POA: Diagnosis not present

## 2016-10-13 DIAGNOSIS — R1084 Generalized abdominal pain: Secondary | ICD-10-CM | POA: Insufficient documentation

## 2016-10-13 DIAGNOSIS — Z79899 Other long term (current) drug therapy: Secondary | ICD-10-CM | POA: Diagnosis not present

## 2016-10-13 DIAGNOSIS — R1010 Upper abdominal pain, unspecified: Secondary | ICD-10-CM

## 2016-10-13 DIAGNOSIS — R109 Unspecified abdominal pain: Secondary | ICD-10-CM | POA: Diagnosis not present

## 2016-10-13 LAB — URINALYSIS, ROUTINE W REFLEX MICROSCOPIC
BILIRUBIN URINE: NEGATIVE
GLUCOSE, UA: NEGATIVE mg/dL
HGB URINE DIPSTICK: NEGATIVE
KETONES UR: NEGATIVE mg/dL
NITRITE: NEGATIVE
PH: 6 (ref 5.0–8.0)
PROTEIN: NEGATIVE mg/dL
Specific Gravity, Urine: 1.006 (ref 1.005–1.030)

## 2016-10-13 LAB — COMPREHENSIVE METABOLIC PANEL
ALT: 18 U/L (ref 14–54)
AST: 25 U/L (ref 15–41)
Albumin: 4.1 g/dL (ref 3.5–5.0)
Alkaline Phosphatase: 63 U/L (ref 38–126)
Anion gap: 8 (ref 5–15)
BUN: 8 mg/dL (ref 6–20)
CHLORIDE: 106 mmol/L (ref 101–111)
CO2: 25 mmol/L (ref 22–32)
CREATININE: 0.88 mg/dL (ref 0.44–1.00)
Calcium: 8.8 mg/dL — ABNORMAL LOW (ref 8.9–10.3)
GFR calc non Af Amer: >60 mL/min (ref >60)
Glucose, Bld: 99 mg/dL (ref 65–99)
POTASSIUM: 3.7 mmol/L (ref 3.5–5.1)
SODIUM: 139 mmol/L (ref 135–145)
Total Bilirubin: 0.4 mg/dL (ref 0.3–1.2)
Total Protein: 6.7 g/dL (ref 6.5–8.1)

## 2016-10-13 LAB — CBC WITH DIFFERENTIAL/PLATELET
Basophils Absolute: 0 10*3/uL (ref 0.0–0.1)
Basophils Relative: 0 %
EOS ABS: 0.1 10*3/uL (ref 0.0–0.7)
Eosinophils Relative: 1 %
HCT: 39.6 % (ref 36.0–46.0)
Hemoglobin: 12.7 g/dL (ref 12.0–15.0)
LYMPHS ABS: 1.5 10*3/uL (ref 0.7–4.0)
Lymphocytes Relative: 21 %
MCH: 23.6 pg — ABNORMAL LOW (ref 26.0–34.0)
MCHC: 32.1 g/dL (ref 30.0–36.0)
MCV: 73.7 fL — ABNORMAL LOW (ref 78.0–100.0)
MONO ABS: 0.8 10*3/uL (ref 0.1–1.0)
Monocytes Relative: 11 %
NEUTROS PCT: 67 %
Neutro Abs: 4.7 10*3/uL (ref 1.7–7.7)
PLATELETS: 200 10*3/uL (ref 150–400)
RBC: 5.37 MIL/uL — AB (ref 3.87–5.11)
RDW: 14.6 % (ref 11.5–15.5)
WBC: 7.1 10*3/uL (ref 4.0–10.5)

## 2016-10-13 LAB — LIPASE, BLOOD: LIPASE: 18 U/L (ref 11–51)

## 2016-10-13 MED ORDER — SODIUM CHLORIDE 0.9 % IV BOLUS (SEPSIS)
1000.0000 mL | Freq: Once | INTRAVENOUS | Status: AC
  Administered 2016-10-13: 1000 mL via INTRAVENOUS

## 2016-10-13 MED ORDER — NITROFURANTOIN MONOHYD MACRO 100 MG PO CAPS: 100.0000 mg | ORAL_CAPSULE | Freq: Two times a day (BID) | ORAL | 0 refills | Status: DC

## 2016-10-13 MED ORDER — CHLORPROMAZINE HCL 25 MG PO TABS
25.0000 mg | ORAL_TABLET | Freq: Once | ORAL | Status: AC
  Administered 2016-10-13: 25 mg via ORAL
  Filled 2016-10-13: qty 1

## 2016-10-13 MED ORDER — DICYCLOMINE HCL 10 MG PO CAPS
10.0000 mg | ORAL_CAPSULE | Freq: Once | ORAL | Status: AC
  Administered 2016-10-13: 10 mg via ORAL
  Filled 2016-10-13: qty 1

## 2016-10-13 MED ORDER — ONDANSETRON HCL 4 MG/2ML IJ SOLN
4.0000 mg | Freq: Once | INTRAMUSCULAR | Status: AC
  Administered 2016-10-13: 4 mg via INTRAVENOUS
  Filled 2016-10-13: qty 2

## 2016-10-13 MED ORDER — ONDANSETRON 4 MG PO TBDP: 4.0000 mg | ORAL_TABLET | Freq: Three times a day (TID) | ORAL | 0 refills | Status: DC | PRN

## 2016-10-13 NOTE — ED Notes (Addendum)
Made first request for urine sample patient  stated give her a few minutes and she would try to provide one.

## 2016-10-13 NOTE — ED Triage Notes (Signed)
Pt is c/o abd cramping and nausea that started yesterday evening  Pt states she has a lot of gas  Pt is constantly belching in triage  Denies vomiting  Pt states she has taken pepto bismol without relief   Pt states she was seen Sunday at Urgent Care and was given Septra DS for a kidney infection

## 2016-10-13 NOTE — Discharge Instructions (Signed)
You were seen today for abdominal pain and nausea related to antibiotic use. Your lab work is reassuring. It is unclear whether he had an ongoing urinary tract infection. Given that her symptoms are related to medication, he will be switched to [place]. Discontinue Bactrim.

## 2016-10-13 NOTE — ED Provider Notes (Signed)
[facility] DEPT Provider Note   CSN: [id number] Arrival date & time: 10/13/16  0435     History   Chief Complaint Chief Complaint  Patient presents with  . Abdominal Pain  . Nausea    HPI [name] is a 56 y.o. female.  HPI  This is a 56 year old female who presents with nausea and abdominal pain. Reports recent diagnosis of urinary tract infection. She was started on Bactrim. Since that time she has had nausea and crampy abdominal pain. It is worse after she takes her medication. Currently her pain is 6 out of 10. She states that she has had persistent gas and belching as well. She reports no diarrhea. Normal bowel movements. She has taken Pepto-Bismol with minimal relief.  Past Medical History:  Diagnosis Date  . Anemia   . Colon polyp 08/28/2012   Tubular adenoma  . Dense breast   . Genital warts   . GERD (gastroesophageal reflux disease)   . Hemorrhoids   . Hx of cardiac catheterization 2009   normal coronary arteries  . Hx of menorrhagia    Novasure  . Hypokalemia   . Hypomagnesemia   . Hypophosphatemia   . Lactose intolerance in adult   . Mitral valve prolapse   . Normal cardiac stress test 03/09/2012   no ischemia  . Positive PPD   . Rapid heart beat    States "irreg heart beat"  . Thyroid disease    Thyroid nodules    Patient Active Problem List   Diagnosis Date Noted  . LUQ abdominal pain 12/04/2015  . Belching 12/04/2015  . Abdominal discomfort 04/08/2015  . Hypercalcemia 07/11/2013  . Sweating 07/05/2013  . Other malaise and fatigue 07/05/2013  . Hypocalcemia 07/05/2013  . Palpitations 07/05/2013  . Low blood pressure reading 07/05/2013  . Pes planus (flat feet) 03/29/2013  . Numbness of toes 03/29/2013  . Toe pain 03/29/2013  . History of PSVT (paroxysmal supraventricular tachycardia) 06/24/2012  . Contact dermatitis 06/23/2012  . Hypokalemia 06/23/2012  . Intertrigo 06/23/2012  . Hx of cardiac catheterization   . Positive PPD     . Multinodular goiter 05/12/2012  . Sinus tachycardia 03/09/2012  . SLEEP APNEA 01/19/2008  . GERD 01/18/2008  . DYSPEPSIA 01/18/2008  . CONSTIPATION 01/18/2008  . IRRITABLE BOWEL SYNDROME 01/18/2008    Past Surgical History:  Procedure Laterality Date  . CARDIAC CATHETERIZATION     x2 with normal results per pt  . NOVASURE ABLATION    . UPPER GASTROINTESTINAL ENDOSCOPY  May 2013    OB History    No data available       Home Medications    Prior to Admission medications   Medication Sig Start Date End Date Taking? Authorizing Provider  cholecalciferol (VITAMIN D) 1000 UNITS tablet Take 2,000 Units by mouth every morning.    Yes Historical Provider, MD  metoprolol tartrate (LOPRESSOR) 25 MG tablet Take 25 mg by mouth 2 (two) times daily. 12/29/15  Yes Historical Provider, MD  Omega-3 Fatty Acids (FISH OIL PO) Take 2 capsules by mouth every other day.    Yes Historical Provider, MD  pantoprazole (PROTONIX) 40 MG tablet Take 40 mg by mouth daily as needed (acid reflux).    Yes Historical Provider, MD  PARoxetine (PAXIL-CR) 25 MG 24 hr tablet Take 25 mg by mouth every morning.    Yes Historical Provider, MD  potassium chloride SA (K-DUR,KLOR-CON) 20 MEQ tablet TAKE 1 TABLET(20 MEQ) BY MOUTH TWICE DAILY 09/16/16  Yes  Burnis Medin, MD  sulfamethoxazole-trimethoprim (BACTRIM DS,SEPTRA DS) 800-160 MG tablet Take 1 tablet by mouth 2 (two) times daily. 10/10/16 10/17/16 Yes Katy Apo, NP  vitamin E 400 UNIT capsule Take 400 Units by mouth daily.   Yes Historical Provider, MD  naproxen (NAPROSYN) 500 MG tablet Take 1 tablet (500 mg total) by mouth 2 (two) times daily. Patient not taking: Reported on 10/13/2016 07/30/16   Doristine Devoid, PA-C  nitrofurantoin, macrocrystal-monohydrate, (MACROBID) 100 MG capsule Take 1 capsule (100 mg total) by mouth 2 (two) times daily. 10/13/16   Merryl Hacker, MD  ondansetron (ZOFRAN ODT) 4 MG disintegrating tablet Take 1 tablet (4 mg total) by  mouth every 8 (eight) hours as needed for nausea or vomiting. 10/13/16   Merryl Hacker, MD    Family History Family History  Problem Relation Age of Onset  . Heart disease Father     died Mi 85  . Hypertension Maternal Grandfather   . Diabetes Maternal Grandfather   . Heart disease Maternal Grandfather   . Colon cancer Maternal Grandfather   . Diabetes Maternal Grandmother     Social History Social History  Substance Use Topics  . Smoking status: Never Smoker  . Smokeless tobacco: Never Used  . Alcohol use No     Allergies   Ceftriaxone sodium and Doxycycline   Review of Systems Review of Systems  Respiratory: Negative for shortness of breath.   Cardiovascular: Negative for chest pain.  Gastrointestinal: Positive for abdominal pain and nausea. Negative for constipation, diarrhea and vomiting.  Genitourinary: Negative for dysuria and flank pain.  Musculoskeletal: Negative for back pain.  All other systems reviewed and are negative.    Physical Exam Updated Vital Signs BP 118/73 (BP Location: Left Arm)   Pulse 90   Temp 97.8 F (36.6 C) (Oral)   Resp 20   Ht 5\' 6"  (1.676 m)   Wt 200 lb (90.7 kg)   LMP 09/18/2016 (Exact Date)   SpO2 99%   BMI 32.28 kg/m   Physical Exam  Constitutional: She is oriented to person, place, and time. She appears well-developed and well-nourished. No distress.  Hiccuping  HENT:  Head: Normocephalic and atraumatic.  Cardiovascular: Normal rate, regular rhythm and normal heart sounds.   Pulmonary/Chest: Effort normal and breath sounds normal. No respiratory distress. She has no wheezes.  Abdominal: Soft. Bowel sounds are normal. There is no tenderness. There is no guarding.  Neurological: She is alert and oriented to person, place, and time.  Skin: Skin is warm and dry.  Psychiatric: She has a normal mood and affect.  Nursing note and vitals reviewed.    ED Treatments / Results  Labs (all labs ordered are listed, but only  abnormal results are displayed) Labs Reviewed  CBC WITH DIFFERENTIAL/PLATELET - Abnormal; Notable for the following:       Result Value   RBC 5.37 (*)    MCV 73.7 (*)    MCH 23.6 (*)    All other components within normal limits  COMPREHENSIVE METABOLIC PANEL - Abnormal; Notable for the following:    Calcium 8.8 (*)    All other components within normal limits  URINALYSIS, ROUTINE W REFLEX MICROSCOPIC - Abnormal; Notable for the following:    Leukocytes, UA TRACE (*)    Bacteria, UA MANY (*)    Squamous Epithelial / LPF 0-5 (*)    All other components within normal limits  LIPASE, BLOOD    EKG  EKG  Interpretation None       Radiology Dg Abdomen Acute W/chest  Result Date: 10/13/2016 CLINICAL DATA:  Abdominal pain. Mid abdominal discomfort and cramping. EXAM: DG ABDOMEN ACUTE W/ 1V CHEST COMPARISON:  Chest radiograph 01/10/2016 FINDINGS: The cardiomediastinal contours are normal. The lungs are clear. There is no free intra-abdominal air. No dilated bowel loops to suggest obstruction. Small-moderate volume of stool throughout the colon. No radiopaque calculi. Multiple pelvic phleboliths. No acute osseous abnormalities are seen. IMPRESSION: Negative abdominal radiographs.  No acute cardiopulmonary disease. Electronically Signed   By: Jeb Levering M.D.   On: 10/13/2016 05:48    Procedures Procedures (including critical care time)  Medications Ordered in ED Medications  sodium chloride 0.9 % bolus 1,000 mL (1,000 mLs Intravenous New Bag/Given 10/13/16 0607)  ondansetron (ZOFRAN) injection 4 mg (4 mg Intravenous Given 10/13/16 0606)  dicyclomine (BENTYL) capsule 10 mg (10 mg Oral Given 10/13/16 0607)  chlorproMAZINE (THORAZINE) tablet 25 mg (25 mg Oral Given 10/13/16 0607)     Initial Impression / Assessment and Plan / ED Course  I have reviewed the triage vital signs and the nursing notes.  Pertinent labs & imaging results that were available during my care of the patient  were reviewed by me and considered in my medical decision making (see chart for details).     Patient presents with nausea and crampy abdominal pain related to taking Bactrim. She is nontoxic on exam. Vital signs reassuring. Exam is benign including abdominal exam. Basic labwork and x-ray obtained and reassuring. Patient given fluids, Thorazine, Zofran. Patient reports marked improvement of her symptoms. I reviewed her urinalysis from urgent care. It was just a dipstick with moderate leukocytes. She currently has trace leukocytes and many bacteria. No prior urine culture. Will send urine culture. Will change to Keflex.  After history, exam, and medical workup I feel the patient has been appropriately medically screened and is safe for discharge home. Pertinent diagnoses were discussed with the patient. Patient was given return precautions.   Final Clinical Impressions(s) / ED Diagnoses   Final diagnoses:  Nausea  Pain of upper abdomen    New Prescriptions New Prescriptions   NITROFURANTOIN, MACROCRYSTAL-MONOHYDRATE, (MACROBID) 100 MG CAPSULE    Take 1 capsule (100 mg total) by mouth 2 (two) times daily.   ONDANSETRON (ZOFRAN ODT) 4 MG DISINTEGRATING TABLET    Take 1 tablet (4 mg total) by mouth every 8 (eight) hours as needed for nausea or vomiting.     Merryl Hacker, MD 10/13/16 [phone]

## 2016-10-13 NOTE — Telephone Encounter (Signed)
Patient called asking about urine results.  No results for urine culture. Patient in [facility] long ed with labs pending.  Spoke to patient, encouraged use of my chart.  Discussed chart with Lenna Sciara browning, rn, [employer]

## 2016-10-14 LAB — URINE CULTURE: CULTURE: NO GROWTH

## 2016-10-17 ENCOUNTER — Encounter (HOSPITAL_COMMUNITY): Payer: Self-pay | Admitting: Emergency Medicine

## 2016-10-17 ENCOUNTER — Ambulatory Visit (HOSPITAL_COMMUNITY)
Admission: EM | Admit: 2016-10-17 | Discharge: 2016-10-17 | Disposition: A | Discharge status: Home / Self Care | Payer: BLUE CROSS/BLUE SHIELD | Attending: Family Medicine | Admitting: Family Medicine

## 2016-10-17 DIAGNOSIS — J111 Influenza due to unidentified influenza virus with other respiratory manifestations: Secondary | ICD-10-CM

## 2016-10-17 DIAGNOSIS — R69 Illness, unspecified: Secondary | ICD-10-CM | POA: Diagnosis not present

## 2016-10-17 MED ORDER — OSELTAMIVIR PHOSPHATE 75 MG PO CAPS: 75.0000 mg | ORAL_CAPSULE | Freq: Two times a day (BID) | ORAL | 0 refills | Status: DC

## 2016-10-17 MED ORDER — HYDROCODONE-HOMATROPINE 5-1.5 MG/5ML PO SYRP: 5.0000 mL | ORAL_SOLUTION | Freq: Four times a day (QID) | ORAL | 0 refills | Status: DC | PRN

## 2016-10-17 NOTE — Discharge Instructions (Signed)
Let us know if your symptoms do not improve after 48 hours.

## 2016-10-17 NOTE — ED Triage Notes (Signed)
The patient presented to the [facility] with a complaint of a cough, headache, fever and chills that started yesterday.

## 2016-10-17 NOTE — ED Provider Notes (Signed)
[facility]    CSN: [id number] Arrival date & time: 10/17/16  1202     History   Chief Complaint Chief Complaint  Patient presents with  . Cough    HPI [name] is a 56 y.o. female.   The patient presented to the [facility] with a complaint of a cough, headache, fever and chills that started yesterday.  She called her family doctor who directed her here.  She has taken Tylenol, last dose early this am.  She also tried Flonase which was very irritating.  Fever to 101 last night  No nausea, vomiting or diarrhea.  She was treated recently for UTI (had low back pain) and the antibiotic seemed to upset her stomach.  She is complaining of constipation which she thinks may be responsible for the crampy lower abdominal pain.  Patient works as a Microbiologist.      Past Medical History:  Diagnosis Date  . Anemia   . Colon polyp 08/28/2012   Tubular adenoma  . Dense breast   . Genital warts   . GERD (gastroesophageal reflux disease)   . Hemorrhoids   . Hx of cardiac catheterization 2009   normal coronary arteries  . Hx of menorrhagia    Novasure  . Hypokalemia   . Hypomagnesemia   . Hypophosphatemia   . Lactose intolerance in adult   . Mitral valve prolapse   . Normal cardiac stress test 03/09/2012   no ischemia  . Positive PPD   . Rapid heart beat    States "irreg heart beat"  . Thyroid disease    Thyroid nodules    Patient Active Problem List   Diagnosis Date Noted  . LUQ abdominal pain 12/04/2015  . Belching 12/04/2015  . Abdominal discomfort 04/08/2015  . Hypercalcemia 07/11/2013  . Sweating 07/05/2013  . Other malaise and fatigue 07/05/2013  . Hypocalcemia 07/05/2013  . Palpitations 07/05/2013  . Low blood pressure reading 07/05/2013  . Pes planus (flat feet) 03/29/2013  . Numbness of toes 03/29/2013  . Toe pain 03/29/2013  . History of PSVT (paroxysmal supraventricular tachycardia) 06/24/2012  . Contact dermatitis  06/23/2012  . Hypokalemia 06/23/2012  . Intertrigo 06/23/2012  . Hx of cardiac catheterization   . Positive PPD   . Multinodular goiter 05/12/2012  . Sinus tachycardia 03/09/2012  . SLEEP APNEA 01/19/2008  . GERD 01/18/2008  . DYSPEPSIA 01/18/2008  . CONSTIPATION 01/18/2008  . IRRITABLE BOWEL SYNDROME 01/18/2008    Past Surgical History:  Procedure Laterality Date  . CARDIAC CATHETERIZATION     x2 with normal results per pt  . NOVASURE ABLATION    . UPPER GASTROINTESTINAL ENDOSCOPY  May 2013    OB History    No data available       Home Medications    Prior to Admission medications   Medication Sig Start Date End Date Taking? Authorizing Provider  cholecalciferol (VITAMIN D) 1000 UNITS tablet Take 2,000 Units by mouth every morning.     Historical Provider, MD  HYDROcodone-homatropine (HYCODAN) 5-1.5 MG/5ML syrup Take 5 mLs by mouth every 6 (six) hours as needed for cough. 10/17/16   Robyn Haber, MD  metoprolol tartrate (LOPRESSOR) 25 MG tablet Take 25 mg by mouth 2 (two) times daily. 12/29/15   Historical Provider, MD  nitrofurantoin, macrocrystal-monohydrate, (MACROBID) 100 MG capsule Take 1 capsule (100 mg total) by mouth 2 (two) times daily. 10/13/16   Merryl Hacker, MD  Omega-3 Fatty Acids (FISH OIL PO) Take  2 capsules by mouth every other day.     Historical Provider, MD  ondansetron (ZOFRAN ODT) 4 MG disintegrating tablet Take 1 tablet (4 mg total) by mouth every 8 (eight) hours as needed for nausea or vomiting. 10/13/16   Merryl Hacker, MD  oseltamivir (TAMIFLU) 75 MG capsule Take 1 capsule (75 mg total) by mouth every 12 (twelve) hours. 10/17/16   Robyn Haber, MD  pantoprazole (PROTONIX) 40 MG tablet Take 40 mg by mouth daily as needed (acid reflux).     Historical Provider, MD  PARoxetine (PAXIL-CR) 25 MG 24 hr tablet Take 25 mg by mouth every morning.     Historical Provider, MD  potassium chloride SA (K-DUR,KLOR-CON) 20 MEQ tablet TAKE 1 TABLET(20 MEQ) BY  MOUTH TWICE DAILY 09/16/16   Burnis Medin, MD  vitamin E 400 UNIT capsule Take 400 Units by mouth daily.    Historical Provider, MD    Family History Family History  Problem Relation Age of Onset  . Heart disease Father     died Mi 55  . Hypertension Maternal Grandfather   . Diabetes Maternal Grandfather   . Heart disease Maternal Grandfather   . Colon cancer Maternal Grandfather   . Diabetes Maternal Grandmother     Social History Social History  Substance Use Topics  . Smoking status: Never Smoker  . Smokeless tobacco: Never Used  . Alcohol use No     Allergies   Ceftriaxone sodium and Doxycycline   Review of Systems Review of Systems  Constitutional: Positive for chills and fever.  HENT: Positive for congestion.   Gastrointestinal: Positive for abdominal pain and constipation. Negative for diarrhea, nausea and vomiting.  Genitourinary: Negative.   Musculoskeletal: Negative for back pain.  Neurological: Positive for headaches.     Physical Exam Triage Vital Signs ED Triage Vitals [10/17/16 1233]  Enc Vitals Group     BP 116/72     Pulse Rate 88     Resp 16     Temp 98.9 F (37.2 C)     Temp Source Oral     SpO2 100 %     Weight      Height      Head Circumference      Peak Flow      Pain Score 5     Pain Loc      Pain Edu?      Excl. in [place]?    No data found.   Updated Vital Signs BP 116/72 (BP Location: Right Arm)   Pulse 88   Temp 98.9 F (37.2 C) (Oral)   Resp 16   LMP 09/18/2016 (Exact Date)   SpO2 100%    Physical Exam  Constitutional: She is oriented to person, place, and time. She appears well-developed and well-nourished.  HENT:  Head: Normocephalic.  Right Ear: External ear normal.  Left Ear: External ear normal.  Mouth/Throat: Oropharynx is clear and moist.  Eyes: Conjunctivae and EOM are normal.  Neck: Normal range of motion. Neck supple.  Cardiovascular: Normal rate, regular rhythm and normal heart sounds.     Pulmonary/Chest: Effort normal and breath sounds normal.  Musculoskeletal: Normal range of motion.  Lymphadenopathy:    She has no cervical adenopathy.  Neurological: She is alert and oriented to person, place, and time.  Skin: Skin is warm and dry.  Nursing note and vitals reviewed.    UC Treatments / Results  Labs (all labs ordered are listed, but only abnormal results  are displayed) Labs Reviewed - No data to display  EKG  EKG Interpretation None       Radiology No results found.  Procedures Procedures (including critical care time)  Medications Ordered in UC Medications - No data to display   Initial Impression / Assessment and Plan / UC Course  I have reviewed the triage vital signs and the nursing notes.  Pertinent labs & imaging results that were available during my care of the patient were reviewed by me and considered in my medical decision making (see chart for details).     Final Clinical Impressions(s) / UC Diagnoses   Final diagnoses:  Influenza-like illness    New Prescriptions New Prescriptions   HYDROCODONE-HOMATROPINE (HYCODAN) 5-1.5 MG/5ML SYRUP    Take 5 mLs by mouth every 6 (six) hours as needed for cough.   OSELTAMIVIR (TAMIFLU) 75 MG CAPSULE    Take 1 capsule (75 mg total) by mouth every 12 (twelve) hours.     Robyn Haber, MD 10/17/16 1326

## 2016-10-25 ENCOUNTER — Telehealth: Payer: Self-pay

## 2016-10-25 NOTE — Telephone Encounter (Signed)
Since I haven't seen her for the problem would need office visit to evaluate. And consider iorder chet x ray other   eval if needed ,.

## 2016-10-25 NOTE — Telephone Encounter (Addendum)
Pt would like to know if Dr Regis Bill would order a chest xray.  With all the sweating she is doing, pt wants to make sure this has not gone into PNA

## 2016-10-25 NOTE — Telephone Encounter (Signed)
Pt reports that she was treated for possible flu last week with Tamiflu and Hycodan. She states that she has improved and it not coughing much. She has developed some slight chills and profuse sweating. She reports sweating nearly every day. She denies any current fevers or body aches. She would like to know what you think about the sweats.  Dr. Regis Bill - Please advise. Thanks!

## 2016-10-25 NOTE — Telephone Encounter (Signed)
Cant really say  Many things could do this     But  Suggest she assess if having fever   100.3 and above . I still having fever    Make OV here or   With  Prescribing provider  To be evaluated      After recovery  If not resolved  she can make OV

## 2016-10-25 NOTE — Telephone Encounter (Signed)
Please help the pt to make an appt.  Thanks! 

## 2016-10-25 NOTE — Telephone Encounter (Signed)
Since I didn't see her

## 2016-10-26 NOTE — Telephone Encounter (Signed)
Pt has been scheduled.  °

## 2016-10-27 ENCOUNTER — Ambulatory Visit (INDEPENDENT_AMBULATORY_CARE_PROVIDER_SITE_OTHER): Payer: BLUE CROSS/BLUE SHIELD | Admitting: Emergency Medicine

## 2016-10-27 ENCOUNTER — Ambulatory Visit (INDEPENDENT_AMBULATORY_CARE_PROVIDER_SITE_OTHER): Payer: BLUE CROSS/BLUE SHIELD

## 2016-10-27 VITALS — BP 124/74 | HR 81 | Temp 98.4°F | Resp 17 | Ht 66.0 in | Wt 204.0 lb

## 2016-10-27 DIAGNOSIS — R091 Pleurisy: Secondary | ICD-10-CM | POA: Diagnosis not present

## 2016-10-27 DIAGNOSIS — R071 Chest pain on breathing: Secondary | ICD-10-CM | POA: Diagnosis not present

## 2016-10-27 DIAGNOSIS — R079 Chest pain, unspecified: Secondary | ICD-10-CM | POA: Diagnosis not present

## 2016-10-27 DIAGNOSIS — R05 Cough: Secondary | ICD-10-CM

## 2016-10-27 DIAGNOSIS — R059 Cough, unspecified: Secondary | ICD-10-CM

## 2016-10-27 MED ORDER — AZITHROMYCIN 250 MG PO TABS: ORAL_TABLET | ORAL | 0 refills | Status: DC

## 2016-10-27 MED ORDER — PREDNISONE 20 MG PO TABS: 40.0000 mg | ORAL_TABLET | Freq: Every day | ORAL | 0 refills | Status: AC

## 2016-10-27 NOTE — Patient Instructions (Addendum)
     IF you received an x-ray today, you will receive an invoice from [facility]. Please contact [facility] at [phone] with questions or concerns regarding your invoice.   IF you received labwork today, you will receive an invoice from [facility]. Please contact [facility] at [phone] with questions or concerns regarding your invoice.   Our billing staff will not be able to assist you with questions regarding bills from these companies.  You will be contacted with the lab results as soon as they are available. The fastest way to get your results is to activate your My Chart account. Instructions are located on the last page of this paperwork. If you have not heard from [place] regarding the results in 2 weeks, please contact this office.      Pleurisy Pleurisy is irritation and swelling (inflammation) of the linings of your lungs (pleura). This can cause pain in your chest, back, or shoulder. It can also cause trouble breathing. Follow these instructions at home: Medicines  Take over-the-counter and prescription medicines only as told by your doctor.  If you were prescribed antibiotic medicine, take it as told by your doctor. Do not stop taking the antibiotic even if you start to feel better. Activity  Rest and return to your normal activities as told by your doctor. Ask your doctor what activities are safe for you.  Do not drive or use heavy machinery while taking prescription pain medicine. General instructions  Watch for any changes in your condition.  Take deep breaths often, even if it is painful. This can help prevent lung problems.  When lying down, lie on your painful side. This may help you feel less pain.  Do not smoke. If you need help quitting, ask your doctor.  Keep all follow-up visits as told by your doctor. This is important. Contact a doctor if:  You have pain that:  Gets worse.  Does not get better with medicine.  Lasts for more than  1 week.  You have a fever or chills.  You have a cough that does not get better at home.  You have trouble breathing that does not get better at home.  You cough up liquid that looks like pus (purulent secretions). Get help right away if:  Your lips, fingernails, or toenails turn dark or turn blue.  You cough up blood.  You have trouble breathing that gets worse.  You are making loud noises when you breathe (wheezing) and this gets worse.  You have pain that spreads to your neck, arms, or jaw.  You get a rash.  You throw up (vomit).  You pass out (faint). Summary  Pleurisy is irritation and swelling (inflammation) of the linings of your lungs (pleura).  Pleurisy can cause pain and trouble breathing.  If you have a cough that does not get better at home, contact your doctor.  Get help right away if you are having trouble breathing and it is getting worse. This information is not intended to replace advice given to you by your health care provider. Make sure you discuss any questions you have with your health care provider. Document Released: 08/12/2008 Document Revised: 05/24/2016 Document Reviewed: 05/24/2016 Elsevier Interactive Patient Education  2017 [employer].

## 2016-10-27 NOTE — Progress Notes (Signed)
[name] 56 y.o.   Chief Complaint  Patient presents with  . Chest Pain  . Cough    HISTORY OF PRESENT ILLNESS: This is a 56 y.o. female complaining of left sided upper pleuritic chest pain x several days; had flu 2 weeks ago and took Tamiflu; symptoms better but still coughing; chest hurts only when coughing. No chest pain on exertion; denies fever, sob; no h/o DM or heart condition except MVP.  HPI   Prior to Admission medications   Medication Sig Start Date End Date Taking? Authorizing Provider  cholecalciferol (VITAMIN D) 1000 UNITS tablet Take 2,000 Units by mouth every morning.    Yes Historical Provider, MD  HYDROcodone-homatropine (HYCODAN) 5-1.5 MG/5ML syrup Take 5 mLs by mouth every 6 (six) hours as needed for cough. 10/17/16  Yes Robyn Haber, MD  metoprolol tartrate (LOPRESSOR) 25 MG tablet Take 25 mg by mouth 2 (two) times daily. 12/29/15  Yes Historical Provider, MD  nitrofurantoin, macrocrystal-monohydrate, (MACROBID) 100 MG capsule Take 1 capsule (100 mg total) by mouth 2 (two) times daily. 10/13/16  Yes Merryl Hacker, MD  Omega-3 Fatty Acids (F[place] OIL PO) Take 2 capsules by mouth every other day.    Yes Historical Provider, MD  ondansetron (ZOFRAN ODT) 4 MG disintegrating tablet Take 1 tablet (4 mg total) by mouth every 8 (eight) hours as needed for nausea or vomiting. 10/13/16  Yes Merryl Hacker, MD  oseltamivir (TAMIFLU) 75 MG capsule Take 1 capsule (75 mg total) by mouth every 12 (twelve) hours. 10/17/16  Yes Robyn Haber, MD  pantoprazole (PROTONIX) 40 MG tablet Take 40 mg by mouth daily as needed (acid reflux).    Yes Historical Provider, MD  PARoxetine (PAXIL-CR) 25 MG 24 hr tablet Take 25 mg by mouth every morning.    Yes Historical Provider, MD  potassium chloride SA (K-DUR,KLOR-CON) 20 MEQ tablet TAKE 1 TABLET(20 MEQ) BY MOUTH TWICE DAILY 09/16/16  Yes Burnis Medin, MD  vitamin E 400 UNIT capsule Take 400 Units by mouth daily.   Yes Historical  Provider, MD    Allergies  Allergen Reactions  . Ceftriaxone Sodium Itching  . Doxycycline Rash    Patient Active Problem List   Diagnosis Date Noted  . LUQ abdominal pain 12/04/2015  . Belching 12/04/2015  . Abdominal discomfort 04/08/2015  . Hypercalcemia 07/11/2013  . Sweating 07/05/2013  . Other malaise and fatigue 07/05/2013  . Hypocalcemia 07/05/2013  . Palpitations 07/05/2013  . Low blood pressure reading 07/05/2013  . Pes planus (flat feet) 03/29/2013  . Numbness of toes 03/29/2013  . Toe pain 03/29/2013  . History of PSVT (paroxysmal supraventricular tachycardia) 06/24/2012  . Contact dermatitis 06/23/2012  . Hypokalemia 06/23/2012  . Intertrigo 06/23/2012  . Hx of cardiac catheterization   . Positive PPD   . Multinodular goiter 05/12/2012  . Sinus tachycardia 03/09/2012  . SLEEP APNEA 01/19/2008  . GERD 01/18/2008  . DYSPEPSIA 01/18/2008  . CONSTIPATION 01/18/2008  . IRRITABLE BOWEL SYNDROME 01/18/2008    Past Medical History:  Diagnosis Date  . Anemia   . Colon polyp 08/28/2012   Tubular adenoma  . Dense breast   . Genital warts   . GERD (gastroesophageal reflux disease)   . Hemorrhoids   . Hx of cardiac catheterization 2009   normal coronary arteries  . Hx of menorrhagia    Novasure  . Hypokalemia   . Hypomagnesemia   . Hypophosphatemia   . Lactose intolerance in adult   . Mitral  valve prolapse   . Normal cardiac stress test 03/09/2012   no ischemia  . Positive PPD   . Rapid heart beat    States "irreg heart beat"  . Thyroid disease    Thyroid nodules    Past Surgical History:  Procedure Laterality Date  . CARDIAC CATHETERIZATION     x2 with normal results per pt  . NOVASURE ABLATION    . UPPER GASTROINTESTINAL ENDOSCOPY  May 2013    Social History   Social History  . Marital status: Divorced    Spouse name: N/A  . Number of children: 2  . 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 Smoking status: Never Smoker  . Smokeless tobacco: Never Used  . Alcohol use No  . Drug use: No  . Sexual activity: Yes   Other Topics Concern  . Not on file   Social History Narrative   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 She lived in [place] and in [place] for a number of years her mom is from [place]. 16 years of education went to college at [employer] lives at home with her son who is in his 88s no pets   Neg ets tob etoh hx PA   6 hours of sleep   G2P2    TD2010  colonoscopy 2009   Now running  own business    HH of 2           Family History  Problem Relation Age of Onset  . Heart disease Father     died Mi 18  . Hypertension Maternal Grandfather   . Diabetes Maternal Grandfather   . Heart disease Maternal Grandfather   . Colon cancer Maternal Grandfather   . Diabetes Maternal Grandmother      Review of Systems  Constitutional: Negative for chills and fever.  HENT: Negative for nosebleeds, sinus pain and sore throat.   Eyes: Negative for discharge and redness.  Respiratory: Positive for cough. Negative for shortness of breath and wheezing.   Cardiovascular: Positive for chest pain (pleuritic) and palpitations (intermittent).  Gastrointestinal: Negative for abdominal pain, diarrhea, nausea and vomiting.  Genitourinary: Negative for dysuria and hematuria.  Musculoskeletal: Negative for myalgias.  Skin: Negative for rash.  Neurological: Negative for dizziness and headaches.  Endo/Heme/Allergies: Does not bruise/bleed easily.  All other systems reviewed and are negative.  Vitals:   10/27/16 1212  BP: 124/74  Pulse: 81  Resp: 17  Temp: 98.4 F (36.9 C)     Physical Exam  Constitutional: She is oriented to person, place, and time. She appears well-developed and well-nourished.  HENT:  Head:  Normocephalic and atraumatic.  Nose: Nose normal.  Mouth/Throat: Oropharynx is clear and moist. No oropharyngeal exudate.  Eyes: Conjunctivae and EOM are normal. Pupils are equal, round, and reactive to light.  Neck: Normal range of motion. Neck supple. No JVD present. No thyromegaly present.  Cardiovascular: Normal rate, regular rhythm and normal heart sounds.   Pulmonary/Chest: Effort normal and breath sounds normal. She has no wheezes. She has no rales.  Abdominal: Soft. She exhibits no distension. There is no tenderness.  Musculoskeletal: Normal range of motion.  Lymphadenopathy:    She has no cervical adenopathy.  Neurological: She is alert and oriented to person, place, and time.  No sensory deficit. She exhibits normal muscle tone.  Skin: Skin is warm and dry. Capillary refill takes less than 2 seconds.  Psychiatric: She has a normal mood and affect. Her behavior is normal.  Vitals reviewed.  EKG: NSR, no acute ischemic changes. CXR: NAD  ASSESSMENT & PLAN: Arienne was seen today for chest pain and cough.  Diagnoses and all orders for this visit:  Chest pain on breathing -     CBC with Differential/Platelet -     Comprehensive metabolic panel -     DG Chest 2 View; Future -     EKG 12-Lead  Cough -     CBC with Differential/Platelet -     Comprehensive metabolic panel -     DG Chest 2 View; Future -     EKG 12-Lead  Pleurisy  Other orders -     azithromycin (ZITHROMAX) 250 MG tablet; Sig as indicated -     predniSONE (DELTASONE) 20 MG tablet; Take 2 tablets (40 mg total) by mouth daily with breakfast.    Patient Instructions       IF you received an x-ray today, you will receive an invoice from [facility]. Please contact [facility] at [phone] with questions or concerns regarding your invoice.   IF you received labwork today, you will receive an invoice from [facility]. Please contact [facility] at [phone] with questions or concerns  regarding your invoice.   Our billing staff will not be able to assist you with questions regarding bills from these companies.  You will be contacted with the lab results as soon as they are available. The fastest way to get your results is to activate your My Chart account. Instructions are located on the last page of this paperwork. If you have not heard from [place] regarding the results in 2 weeks, please contact this office.      Pleurisy Pleurisy is irritation and swelling (inflammation) of the linings of your lungs (pleura). This can cause pain in your chest, back, or shoulder. It can also cause trouble breathing. Follow these instructions at home: Medicines  Take over-the-counter and prescription medicines only as told by your doctor.  If you were prescribed antibiotic medicine, take it as told by your doctor. Do not stop taking the antibiotic even if you start to feel better. Activity  Rest and return to your normal activities as told by your doctor. Ask your doctor what activities are safe for you.  Do not drive or use heavy machinery while taking prescription pain medicine. General instructions  Watch for any changes in your condition.  Take deep breaths often, even if it is painful. This can help prevent lung problems.  When lying down, lie on your painful side. This may help you feel less pain.  Do not smoke. If you need help quitting, ask your doctor.  Keep all follow-up visits as told by your doctor. This is important. Contact a doctor if:  You have pain that:  Gets worse.  Does not get better with medicine.  Lasts for more than 1 week.  You have a fever or chills.  You have a cough that does not get better at home.  You have trouble breathing that does not get better at home.  You cough up liquid that looks like pus (purulent secretions). Get help right away if:  Your lips, fingernails, or toenails turn dark or turn blue.  You cough up blood.  You  have trouble breathing that gets  worse.  You are making loud noises when you breathe (wheezing) and this gets worse.  You have pain that spreads to your neck, arms, or jaw.  You get a rash.  You throw up (vomit).  You pass out (faint). Summary  Pleurisy is irritation and swelling (inflammation) of the linings of your lungs (pleura).  Pleurisy can cause pain and trouble breathing.  If you have a cough that does not get better at home, contact your doctor.  Get help right away if you are having trouble breathing and it is getting worse. This information is not intended to replace advice given to you by your health care provider. Make sure you discuss any questions you have with your health care provider. Document Released: 08/12/2008 Document Revised: 05/24/2016 Document Reviewed: 05/24/2016 Elsevier Interactive Patient Education  2017 Elsevier Inc.     Agustina Caroli, MD [facility]

## 2016-10-27 NOTE — Progress Notes (Deleted)
[facility] [address], [place] [address] Phone: [phone] Subjective:    I'm seeing this patient by the request  of:  Lottie Dawson, MD   CC: Low back pain and left-sided  [id number]  [name] is a 56 y.o. female coming in with complaint of Left-sided low back pain. Patient states that this pain significantly unrelenting. More of a chronic issue. No weakness but the patient does state that there is some radiation going down the legs. Denies any bowel or bladder incontinence. Denies any fevers, chills, any abnormal weight loss. His been taking over-the-counter anti-inflammatories with very minimal benefit.   patient did have x-rays of the lumbar spine 07/30/2016. This was inability visualized by me showing mild degenerative disc disease at multiple levels especially L3-L5. Patient also having bilateral hip x-rays were unremarkable for any bony normality. Past Medical History:  Diagnosis Date  . Anemia   . Colon polyp 08/28/2012   Tubular adenoma  . Dense breast   . Genital warts   . GERD (gastroesophageal reflux disease)   . Hemorrhoids   . Hx of cardiac catheterization 2009   normal coronary arteries  . Hx of menorrhagia    Novasure  . Hypokalemia   . Hypomagnesemia   . Hypophosphatemia   . Lactose intolerance in adult   . Mitral valve prolapse   . Normal cardiac stress test 03/09/2012   no ischemia  . Positive PPD   . Rapid heart beat    States "irreg heart beat"  . Thyroid disease    Thyroid nodules   Past Surgical History:  Procedure Laterality Date  . CARDIAC CATHETERIZATION     x2 with normal results per pt  . NOVASURE ABLATION    . UPPER GASTROINTESTINAL ENDOSCOPY  May 2013   Social History   Social History  . Marital status: Divorced    Spouse name: N/A  . Number of children: 2  . 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 Smoking status: Never Smoker  . Smokeless tobacco: Never Used  . Alcohol use No  . Drug use: No  . Sexual activity: Yes   Other Topics Concern  . Not on file   Social History Narrative   Ms. 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 She lived in [place] and in [place] for a number of years her mom is from [place]. 16 years of education went to college at [employer] lives at home with her son who is in his 71s no pets   Neg ets tob etoh hx PA   6 hours of sleep   G2P2    TD2010  colonoscopy 2009   Now running  own business    HH of 2          Allergies  Allergen Reactions  . Ceftriaxone Sodium Itching  . Doxycycline Rash   Family History  Problem Relation Age of Onset  . Heart disease Father     died Mi 44  . Hypertension Maternal Grandfather   . Diabetes Maternal Grandfather   . Heart disease Maternal Grandfather   . Colon cancer Maternal Grandfather   . Diabetes Maternal Grandmother     Past medical history, social, surgical and family history all reviewed in electronic medical record.  No pertanent information unless stated regarding to the chief complaint.   Review of Systems:Review of systems updated and as accurate as of 10/27/16  No headache, visual changes, nausea, vomiting, diarrhea, constipation, dizziness, abdominal pain, skin rash, fevers, chills, night sweats, weight loss, swollen lymph nodes, body aches, joint swelling, muscle aches, chest pain, shortness of breath, mood changes.   Objective  There were no vitals taken for this visit. Systems examined below as of 10/27/16   General: No apparent distress alert and oriented x3 mood and affect normal, dressed appropriately.  HEENT: Pupils equal, extraocular movements intact  Respiratory: Patient's speak in full sentences and does not appear short of breath  Cardiovascular: No lower  extremity edema, non tender, no erythema  Skin: Warm dry intact with no signs of infection or rash on extremities or on axial skeleton.  Abdomen: Soft nontender  Neuro: Cranial nerves II through XII are intact, neurovascularly intact in all extremities with 2+ DTRs and 2+ pulses.  Lymph: No lymphadenopathy of posterior or anterior cervical chain or axillae bilaterally.  Gait normal with good balance and coordination.  MSK:  Non tender with full range of motion and good stability and symmetric strength and tone of shoulders, elbows, wrist, hip, knee and ankles bilaterally.  Back Exam:  Inspection: Unremarkable  Motion: Flexion 45 deg, Extension 45 deg, Side Bending to 45 deg bilaterally,  Rotation to 45 deg bilaterally  SLR laying: Negative  XSLR laying: Negative  Palpable tenderness: None. FABER: negative. Sensory change: Gross sensation intact to all lumbar and sacral dermatomes.  Reflexes: 2+ at both patellar tendons, 2+ at achilles tendons, Babinski's downgoing.  Strength at foot  Plantar-flexion: 5/5 Dorsi-flexion: 5/5 Eversion: 5/5 Inversion: 5/5  Leg strength  Quad: 5/5 Hamstring: 5/5 Hip flexor: 5/5 Hip abductors: 5/5  Gait unremarkable.    Impression and Recommendations:     This case required medical decision making of moderate complexity.      Note: This dictation was prepared with Dragon dictation along with smaller phrase technology. Any transcriptional errors that result from this process are unintentional.

## 2016-10-28 ENCOUNTER — Encounter: Payer: Self-pay | Admitting: Emergency Medicine

## 2016-10-28 ENCOUNTER — Ambulatory Visit: Payer: BLUE CROSS/BLUE SHIELD | Admitting: Family Medicine

## 2016-10-28 ENCOUNTER — Encounter: Payer: Self-pay | Admitting: *Deleted

## 2016-10-28 ENCOUNTER — Ambulatory Visit: Payer: BLUE CROSS/BLUE SHIELD | Admitting: Internal Medicine

## 2016-10-28 LAB — CBC WITH DIFFERENTIAL/PLATELET
Basophils Absolute: 0 10*3/uL (ref 0.0–0.2)
Basos: 0 %
EOS (ABSOLUTE): 0.1 10*3/uL (ref 0.0–0.4)
EOS: 2 %
HEMATOCRIT: 40.1 % (ref 34.0–46.6)
Hemoglobin: 12.5 g/dL (ref 11.1–15.9)
IMMATURE GRANULOCYTES: 1 %
Immature Grans (Abs): 0.1 10*3/uL (ref 0.0–0.1)
LYMPHS ABS: 1.8 10*3/uL (ref 0.7–3.1)
Lymphs: 21 %
MCH: 23.5 pg — ABNORMAL LOW (ref 26.6–33.0)
MCHC: 31.2 g/dL — AB (ref 31.5–35.7)
MCV: 76 fL — ABNORMAL LOW (ref 79–97)
MONOCYTES: 8 %
MONOS ABS: 0.7 10*3/uL (ref 0.1–0.9)
NEUTROS PCT: 68 %
Neutrophils Absolute: 6 10*3/uL (ref 1.4–7.0)
Platelets: 219 10*3/uL (ref 150–379)
RBC: 5.31 x10E6/uL — AB (ref 3.77–5.28)
RDW: 15.9 % — AB (ref 12.3–15.4)
WBC: 8.8 10*3/uL (ref 3.4–10.8)

## 2016-10-28 LAB — COMPREHENSIVE METABOLIC PANEL
ALK PHOS: 62 IU/L (ref 39–117)
ALT: 13 IU/L (ref 0–32)
AST: 14 IU/L (ref 0–40)
Albumin/Globulin Ratio: 1.7 (ref 1.2–2.2)
Albumin: 4 g/dL (ref 3.5–5.5)
BUN/Creatinine Ratio: 10 (ref 9–23)
BUN: 8 mg/dL (ref 6–24)
Bilirubin Total: 0.3 mg/dL (ref 0.0–1.2)
CALCIUM: 9 mg/dL (ref 8.7–10.2)
CO2: 25 mmol/L (ref 18–29)
CREATININE: 0.79 mg/dL (ref 0.57–1.00)
Chloride: 102 mmol/L (ref 96–106)
GFR calc Af Amer: 96 mL/min/{1.73_m2} (ref >59)
GFR, EST NON AFRICAN AMERICAN: 83 mL/min/{1.73_m2} (ref >59)
GLOBULIN, TOTAL: 2.4 g/dL (ref 1.5–4.5)
GLUCOSE: 109 mg/dL — AB (ref 65–99)
Potassium: 3.8 mmol/L (ref 3.5–5.2)
Sodium: 143 mmol/L (ref 134–144)
Total Protein: 6.4 g/dL (ref 6.0–8.5)

## 2016-10-29 ENCOUNTER — Ambulatory Visit (HOSPITAL_COMMUNITY)
Admission: EM | Admit: 2016-10-29 | Discharge: 2016-10-29 | Disposition: A | Discharge status: Nursing Home (Includes ICF) | Payer: BLUE CROSS/BLUE SHIELD | Attending: Family Medicine | Admitting: Family Medicine

## 2016-10-29 ENCOUNTER — Encounter (HOSPITAL_COMMUNITY): Payer: Self-pay | Admitting: Emergency Medicine

## 2016-10-29 DIAGNOSIS — R109 Unspecified abdominal pain: Secondary | ICD-10-CM

## 2016-10-29 LAB — POCT URINALYSIS DIP (DEVICE)
Bilirubin Urine: NEGATIVE
Glucose, UA: NEGATIVE mg/dL
Hgb urine dipstick: NEGATIVE
Ketones, ur: NEGATIVE mg/dL
Leukocytes, UA: NEGATIVE
NITRITE: NEGATIVE
PH: 5.5 (ref 5.0–8.0)
PROTEIN: NEGATIVE mg/dL
Specific Gravity, Urine: 1.01 (ref 1.005–1.030)
UROBILINOGEN UA: 0.2 mg/dL (ref 0.0–1.0)

## 2016-10-29 NOTE — ED Triage Notes (Signed)
Here for poss UTI sx onset 3 days   Reports she was seen here for similar sx but did not finish antibiotics  Sx today include: back pain, nauseas, weakness, fatigue, urinary freq/urgency  Denies dysuria

## 2016-10-29 NOTE — Discharge Instructions (Signed)
Go directly to ER for further eval of flank pain.

## 2016-10-29 NOTE — ED Provider Notes (Signed)
[facility]    CSN: [id number] Arrival date & time: 10/29/16  1048     History   Chief Complaint Chief Complaint  Patient presents with  . Urinary Tract Infection    HPI [name] is a 56 y.o. female.   The history is provided by the patient.  Urinary Tract Infection  Pain quality:  Aching Pain severity:  Moderate Onset quality:  Gradual Duration:  3 days Timing:  Intermittent Progression:  Worsening (level 8-10/10 pain) Chronicity:  New (seen at [facility] and by lmd with abx for urinary sx with no relief.) Recent urinary tract infections: no   Relieved by:  Nothing Associated symptoms: abdominal pain and flank pain   Associated symptoms: no fever, no nausea and no vomiting   Risk factors: no recurrent urinary tract infections     Past Medical History:  Diagnosis Date  . Anemia   . Colon polyp 08/28/2012   Tubular adenoma  . Dense breast   . Genital warts   . GERD (gastroesophageal reflux disease)   . Hemorrhoids   . Hx of cardiac catheterization 2009   normal coronary arteries  . Hx of menorrhagia    Novasure  . Hypokalemia   . Hypomagnesemia   . Hypophosphatemia   . Lactose intolerance in adult   . Mitral valve prolapse   . Normal cardiac stress test 03/09/2012   no ischemia  . Positive PPD   . Rapid heart beat    States "irreg heart beat"  . Thyroid disease    Thyroid nodules    Patient Active Problem List   Diagnosis Date Noted  . LUQ abdominal pain 12/04/2015  . Belching 12/04/2015  . Abdominal discomfort 04/08/2015  . Hypercalcemia 07/11/2013  . Sweating 07/05/2013  . Other malaise and fatigue 07/05/2013  . Hypocalcemia 07/05/2013  . Palpitations 07/05/2013  . Low blood pressure reading 07/05/2013  . Pes planus (flat feet) 03/29/2013  . Numbness of toes 03/29/2013  . Toe pain 03/29/2013  . History of PSVT (paroxysmal supraventricular tachycardia) 06/24/2012  . Contact dermatitis 06/23/2012  . Hypokalemia 06/23/2012  .  Intertrigo 06/23/2012  . Hx of cardiac catheterization   . Positive PPD   . Multinodular goiter 05/12/2012  . Sinus tachycardia 03/09/2012  . SLEEP APNEA 01/19/2008  . GERD 01/18/2008  . DYSPEPSIA 01/18/2008  . CONSTIPATION 01/18/2008  . IRRITABLE BOWEL SYNDROME 01/18/2008    Past Surgical History:  Procedure Laterality Date  . CARDIAC CATHETERIZATION     x2 with normal results per pt  . NOVASURE ABLATION    . UPPER GASTROINTESTINAL ENDOSCOPY  May 2013    OB History    No data available       Home Medications    Prior to Admission medications   Medication Sig Start Date End Date Taking? Authorizing Provider  azithromycin (ZITHROMAX) 250 MG tablet Sig as indicated 10/27/16   [facility], MD  cholecalciferol (VITAMIN D) 1000 UNITS tablet Take 2,000 Units by mouth every morning.     Historical Provider, MD  HYDROcodone-homatropine (HYCODAN) 5-1.5 MG/5ML syrup Take 5 mLs by mouth every 6 (six) hours as needed for cough. 10/17/16   Robyn Haber, MD  metoprolol tartrate (LOPRESSOR) 25 MG tablet Take 25 mg by mouth 2 (two) times daily. 12/29/15   Historical Provider, MD  nitrofurantoin, macrocrystal-monohydrate, (MACROBID) 100 MG capsule Take 1 capsule (100 mg total) by mouth 2 (two) times daily. 10/13/16   Merryl Hacker, MD  Omega-3 Fatty Acids ([place]  OIL PO) Take 2 capsules by mouth every other day.     Historical Provider, MD  ondansetron (ZOFRAN ODT) 4 MG disintegrating tablet Take 1 tablet (4 mg total) by mouth every 8 (eight) hours as needed for nausea or vomiting. 10/13/16   Merryl Hacker, MD  oseltamivir (TAMIFLU) 75 MG capsule Take 1 capsule (75 mg total) by mouth every 12 (twelve) hours. 10/17/16   Robyn Haber, MD  pantoprazole (PROTONIX) 40 MG tablet Take 40 mg by mouth daily as needed (acid reflux).     Historical Provider, MD  PARoxetine (PAXIL-CR) 25 MG 24 hr tablet Take 25 mg by mouth every morning.     Historical Provider, MD  potassium chloride SA  (K-DUR,KLOR-CON) 20 MEQ tablet TAKE 1 TABLET(20 MEQ) BY MOUTH TWICE DAILY 09/16/16   Burnis Medin, MD  predniSONE (DELTASONE) 20 MG tablet Take 2 tablets (40 mg total) by mouth daily with breakfast. 10/27/16 11/01/16  Horald Pollen, MD  vitamin E 400 UNIT capsule Take 400 Units by mouth daily.    Historical Provider, MD    Family History Family History  Problem Relation Age of Onset  . Heart disease Father     died Mi 7  . Hypertension Maternal Grandfather   . Diabetes Maternal Grandfather   . Heart disease Maternal Grandfather   . Colon cancer Maternal Grandfather   . Diabetes Maternal Grandmother     Social History Social History  Substance Use Topics  . Smoking status: Never Smoker  . Smokeless tobacco: Never Used  . Alcohol use No     Allergies   Ceftriaxone sodium and Doxycycline   Review of Systems Review of Systems  Constitutional: Negative for fever.  Gastrointestinal: Positive for abdominal pain. Negative for nausea and vomiting.  Genitourinary: Positive for flank pain. Negative for menstrual problem and pelvic pain.  All other systems reviewed and are negative.    Physical Exam Triage Vital Signs ED Triage Vitals  Enc Vitals Group     BP 10/29/16 1144 130/82     Pulse Rate 10/29/16 1144 80     Resp 10/29/16 1144 18     Temp 10/29/16 1144 98.6 F (37 C)     Temp Source 10/29/16 1144 Oral     SpO2 --      Weight --      Height --      Head Circumference --      Peak Flow --      Pain Score 10/29/16 1137 8     Pain Loc --      Pain Edu? --      Excl. in [place]? --    No data found.   Updated Vital Signs BP 130/82 (BP Location: Right Arm)   Pulse 80   Temp 98.6 F (37 C) (Oral)   Resp 18   Visual Acuity Right Eye Distance:   Left Eye Distance:   Bilateral Distance:    Right Eye Near:   Left Eye Near:    Bilateral Near:     Physical Exam  Constitutional: She is oriented to person, place, and time. She appears well-developed and  well-nourished.  Neck: Normal range of motion. Neck supple.  Cardiovascular: Normal rate, regular rhythm, normal heart sounds and intact distal pulses.   Pulmonary/Chest: Effort normal and breath sounds normal.  Abdominal: Soft. Normal appearance and bowel sounds are normal. She exhibits no distension and no mass. There is tenderness. There is CVA tenderness. There is no  rigidity, no rebound and no guarding.  Neurological: She is alert and oriented to person, place, and time.  Skin: Skin is warm and dry.  Nursing note and vitals reviewed.    UC Treatments / Results  Labs (all labs ordered are listed, but only abnormal results are displayed) Labs Reviewed  POCT URINALYSIS DIP (DEVICE)    EKG  EKG Interpretation None       Radiology Dg Chest 2 View  Result Date: 10/27/2016 CLINICAL DATA:  Chest pain EXAM: CHEST  2 VIEW COMPARISON:  None. FINDINGS: Lungs are clear. Heart size and pulmonary vascularity are normal. No adenopathy. No pneumothorax. No bone lesions. IMPRESSION: No edema or consolidation. Electronically Signed   By: Lowella Grip III M.D.   On: 10/27/2016 12:59    Procedures Procedures (including critical care time)  Medications Ordered in UC Medications - No data to display   Initial Impression / Assessment and Plan / UC Course  I have reviewed the triage vital signs and the nursing notes.  Pertinent labs & imaging results that were available during my care of the patient were reviewed by me and considered in my medical decision making (see chart for details).     Sent for eval of persistent right flank pain.  Final Clinical Impressions(s) / UC Diagnoses   Final diagnoses:  Acute right flank pain    New Prescriptions New Prescriptions   No medications on file     Billy Fischer, MD 10/29/16 1223

## 2016-10-30 ENCOUNTER — Ambulatory Visit (INDEPENDENT_AMBULATORY_CARE_PROVIDER_SITE_OTHER): Payer: BLUE CROSS/BLUE SHIELD | Admitting: Family Medicine

## 2016-10-30 VITALS — BP 110/70 | HR 55 | Temp 97.5°F | Resp 16 | Ht 66.0 in | Wt 204.0 lb

## 2016-10-30 DIAGNOSIS — R5383 Other fatigue: Secondary | ICD-10-CM | POA: Diagnosis not present

## 2016-10-30 NOTE — Progress Notes (Signed)
Patient ID: [name], female    DOB: Jan 06, 1961, 56 y.o.   MRN: [id number]  PCP: Lottie Dawson, MD  Chief Complaint  Patient presents with  . Fatigue    Wants thyroid checked    Subjective:  HPI  56 year old female presents for evaluation of fatigue. Past medical history includes: multinodular goiter with a normal TSH. She reports over the last month she has been seen by multiple providers and diagnosed with UTI in late January and Influenza like illness in February.  Most recently, she presented to Primary Care at [facility] and was treated for bronchial type illness with Azithromycin and prednisone. She reports never taking either of these medications.  Today she presents requesting TSH labs as she reports increasing fatigue which she feels is separate from her recent influenza dx. Reports generalized body fatigue. Even with rest, she never feels completely refreshed. Reports having body sweats or hot flashes. She was seen yesterday at [facility] for another complaint and the provider recommended that she receive a "overall work-out" at her primary care. She is a regular patient at [facility] and reports a scheduled physical on March 15 with Dr. Regis Bill.   Social History   Social History  . Marital status: Divorced    Spouse name: N/A  . Number of children: 2  . Years of education: 16   Occupational History  . dialysis tech [employer]   Social History Main Topics  . Smoking status: Never Smoker  . Smokeless tobacco: Never Used  . Alcohol use No  . Drug use: No  . Sexual activity: Yes   Other Topics Concern  . Not on file   Social History Narrative   Ms. Lysiak is a divorced [place] American female who works as a Engineer, 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 She lived in [place] and in [place] for a number of years her mom is from [place]. 16 years of education  went to college at [employer] lives at home with her son who is in his 78s no pets   Neg ets tob etoh hx PA   6 hours of sleep   G2P2    TD2010  colonoscopy 2009   Now running  own business    HH of 2           Family History  Problem Relation Age of Onset  . Heart disease Father     died Mi 22  . Hypertension Maternal Grandfather   . Diabetes Maternal Grandfather   . Heart disease Maternal Grandfather   . Colon cancer Maternal Grandfather   . Diabetes Maternal Grandmother    Review of Systems See HPI Patient Active Problem List   Diagnosis Date Noted  . LUQ abdominal pain 12/04/2015  . Belching 12/04/2015  . Abdominal discomfort 04/08/2015  . Hypercalcemia 07/11/2013  . Sweating 07/05/2013  . Other malaise and fatigue 07/05/2013  . Hypocalcemia 07/05/2013  . Palpitations 07/05/2013  . Low blood pressure reading 07/05/2013  . Pes planus (flat feet) 03/29/2013  . Numbness of toes 03/29/2013  . Toe pain 03/29/2013  . History of PSVT (paroxysmal supraventricular tachycardia) 06/24/2012  . Contact dermatitis 06/23/2012  . Hypokalemia 06/23/2012  . Intertrigo 06/23/2012  . Hx of cardiac catheterization   . Positive PPD   . Multinodular goiter 05/12/2012  . Sinus tachycardia 03/09/2012  . SLEEP APNEA 01/19/2008  . GERD 01/18/2008  .  DYSPEPSIA 01/18/2008  . CONSTIPATION 01/18/2008  . IRRITABLE BOWEL SYNDROME 01/18/2008    Allergies  Allergen Reactions  . Ceftriaxone Sodium Itching  . Doxycycline Rash    Prior to Admission medications   Medication Sig Start Date End Date Taking? Authorizing Provider  azithromycin (ZITHROMAX) 250 MG tablet Sig as indicated 10/27/16  Yes Horald Pollen, MD  cholecalciferol (VITAMIN D) 1000 UNITS tablet Take 2,000 Units by mouth every morning.    Yes Historical Provider, MD  HYDROcodone-homatropine (HYCODAN) 5-1.5 MG/5ML syrup Take 5 mLs by mouth every 6 (six) hours as needed for cough. 10/17/16  Yes Robyn Haber, MD  metoprolol  tartrate (LOPRESSOR) 25 MG tablet Take 25 mg by mouth 2 (two) times daily. 12/29/15  Yes Historical Provider, MD  nitrofurantoin, macrocrystal-monohydrate, (MACROBID) 100 MG capsule Take 1 capsule (100 mg total) by mouth 2 (two) times daily. 10/13/16  Yes Merryl Hacker, MD  Omega-3 Fatty Acids (FISH OIL PO) Take 2 capsules by mouth every other day.    Yes Historical Provider, MD  ondansetron (ZOFRAN ODT) 4 MG disintegrating tablet Take 1 tablet (4 mg total) by mouth every 8 (eight) hours as needed for nausea or vomiting. 10/13/16  Yes Merryl Hacker, MD  oseltamivir (TAMIFLU) 75 MG capsule Take 1 capsule (75 mg total) by mouth every 12 (twelve) hours. 10/17/16  Yes Robyn Haber, MD  pantoprazole (PROTONIX) 40 MG tablet Take 40 mg by mouth daily as needed (acid reflux).    Yes Historical Provider, MD  PARoxetine (PAXIL-CR) 25 MG 24 hr tablet Take 25 mg by mouth every morning.    Yes Historical Provider, MD  potassium chloride SA (K-DUR,KLOR-CON) 20 MEQ tablet TAKE 1 TABLET(20 MEQ) BY MOUTH TWICE DAILY 09/16/16  Yes Burnis Medin, MD  predniSONE (DELTASONE) 20 MG tablet Take 2 tablets (40 mg total) by mouth daily with breakfast. 10/27/16 11/01/16 Yes [place], MD  vitamin E 400 UNIT capsule Take 400 Units by mouth daily.   Yes Historical Provider, MD    Past Medical, Surgical Family and Social History reviewed and updated.    Objective:   Today's Vitals   10/30/16 0817  BP: 110/70  Pulse: (!) 55  Resp: 16  Temp: 97.5 F (36.4 C)  TempSrc: Oral  SpO2: 99%  Weight: 204 lb (92.5 kg)  Height: 5\' 6"  (1.676 m)    Wt Readings from Last 3 Encounters:  10/30/16 204 lb (92.5 kg)  10/27/16 204 lb (92.5 kg)  10/13/16 200 lb (90.7 kg)   Physical Exam  Constitutional: She appears well-developed and well-nourished.  HENT:  Head: Normocephalic and atraumatic.  Right Ear: External ear normal.  Left Ear: External ear normal.  Nose: Nose normal.  Mouth/Throat: Oropharynx is clear  and moist. No oropharyngeal exudate.  Eyes: Conjunctivae are normal. Pupils are equal, round, and reactive to light.  Neck: Normal range of motion. Neck supple. Thyromegaly present.  Cardiovascular: Normal rate, regular rhythm and intact distal pulses.   Pulmonary/Chest: Effort normal and breath sounds normal.  Musculoskeletal: Normal range of motion.  Lymphadenopathy:    She has no cervical adenopathy.  Neurological: She is alert.  Skin: Skin is warm and dry.  Psychiatric: Her behavior is normal. Judgment and thought content normal. Her mood appears anxious. Cognition and memory are normal.     Assessment & Plan:  1. Fatigue, unspecified type Plan: -Thyroid Panel With TSH-screen for hypothyroidism. Patient has mildly enlarged thyroid gland. -Hemoglobin A1C- screening due to prior mildly elevated  glucose levels. Ruling out diabetes as a cause of underlying fatigue. - Ambulatory referral to Endocrinology-pt reports she was initially evaluated for goiter at [facility] although it has been several years since she received an ultrasound.  Sending referral for a specialty evaluation.  You will be notified of your lab results and contacted to schedule an appointment with Endocrinology.   Carroll Sage. Kenton Kingfisher, MSN, FNP-C Primary Care at [facility]

## 2016-10-30 NOTE — Patient Instructions (Addendum)
You will be notified upon receipt of lab results. I have placed a referral for you to see Endocrinology.   IF you received an x-ray today, you will receive an invoice from [facility]. Please contact [facility] at [phone] with questions or concerns regarding your invoice.   IF you received labwork today, you will receive an invoice from [facility]. Please contact [facility] at [phone] with questions or concerns regarding your invoice.   Our billing staff will not be able to assist you with questions regarding bills from these companies.  You will be contacted with the lab results as soon as they are available. The fastest way to get your results is to activate your My Chart account. Instructions are located on the last page of this paperwork. If you have not heard from [place] regarding the results in 2 weeks, please contact this office.     Fatigue Introduction Fatigue is feeling tired all of the time, a lack of energy, or a lack of motivation. Occasional or mild fatigue is often a normal response to activity or life in general. However, long-lasting (chronic) or extreme fatigue may indicate an underlying medical condition. Follow these instructions at home: Watch your fatigue for any changes. The following actions may help to lessen any discomfort you are feeling:  Talk to your health care provider about how much sleep you need each night. Try to get the required amount every night.  Take medicines only as directed by your health care provider.  Eat a healthy and nutritious diet. Ask your health care provider if you need help changing your diet.  Drink enough fluid to keep your urine clear or pale yellow.  Practice ways of relaxing, such as yoga, meditation, massage therapy, or acupuncture.  Exercise regularly.  Change situations that cause you stress. Try to keep your work and personal routine reasonable.  Do not abuse illegal drugs.  Limit alcohol intake to  no more than 1 drink per day for nonpregnant women and 2 drinks per day for men. One drink equals 12 ounces of beer, 5 ounces of wine, or 1 ounces of hard liquor.  Take a multivitamin, if directed by your health care provider. Contact a health care provider if:  Your fatigue does not get better.  You have a fever.  You have unintentional weight loss or gain.  You have headaches.  You have difficulty:  Falling asleep.  Sleeping throughout the night.  You feel angry, guilty, anxious, or sad.  You are unable to have a bowel movement (constipation).  You skin is dry.  Your legs or another part of your body is swollen. Get help right away if:  You feel confused.  Your vision is blurry.  You feel faint or pass out.  You have a severe headache.  You have severe abdominal, pelvic, or back pain.  You have chest pain, shortness of breath, or an irregular or fast heartbeat.  You are unable to urinate or you urinate less than normal.  You develop abnormal bleeding, such as bleeding from the rectum, vagina, nose, lungs, or nipples.  You vomit blood.  You have thoughts about harming yourself or committing suicide.  You are worried that you might harm someone else. This information is not intended to replace advice given to you by your health care provider. Make sure you discuss any questions you have with your health care provider. Document Released: 06/27/2007 Document Revised: 02/05/2016 Document Reviewed: 01/01/2014  2017 Elsevier

## 2016-10-31 LAB — THYROID PANEL WITH TSH
Free Thyroxine Index: 1.6 (ref 1.2–4.9)
T3 UPTAKE RATIO: 24 % (ref 24–39)
T4 TOTAL: 6.5 ug/dL (ref 4.5–12.0)
TSH: 1.08 u[IU]/mL (ref 0.450–4.500)

## 2016-10-31 LAB — HEMOGLOBIN A1C
ESTIMATED AVERAGE GLUCOSE: 114 mg/dL
HEMOGLOBIN A1C: 5.6 % (ref 4.8–5.6)

## 2016-11-01 ENCOUNTER — Encounter: Payer: Self-pay | Admitting: Family Medicine

## 2016-11-01 NOTE — Progress Notes (Signed)
  Dear Ms. Butts,  Below are the results from your recent visit were as expected.  Resulted Orders  Thyroid Panel With TSH  Result Value Ref Range   TSH 1.080 0.450 - 4.500 uIU/mL   T4, Total 6.5 4.5 - 12.0 ug/dL   T3 Uptake Ratio 24 24 - 39 %   Free Thyroxine Index 1.6 1.2 - 4.9   Narrative   Performed at:  [address], [place], [place]  [id number] Lab Director: Lindon Romp MD, Phone:  [phone]  Hemoglobin A1c  Result Value Ref Range   Hgb A1c MFr Bld 5.6 4.8 - 5.6 %     Comment:              Pre-diabetes: 5.7 - 6.4          Diabetes: >6.4          Glycemic control for adults with diabetes: <7.0    Est. average glucose Bld gHb Est-mCnc 114 mg/dL   Narrative   Performed at:  [address], [place], [place]  [id number] Lab Director: Lindon Romp MD, Phone:  [phone]     If you have any questions or concerns, please don't hesitate to call.  Sincerely,   Molli Barrows, FNP  Dear Ms. Pontecorvo,  Below are the results from your recent visit were as expected.  Resulted Orders  Thyroid Panel With TSH  Result Value Ref Range   TSH 1.080 0.450 - 4.500 uIU/mL   T4, Total 6.5 4.5 - 12.0 ug/dL   T3 Uptake Ratio 24 24 - 39 %   Free Thyroxine Index 1.6 1.2 - 4.9   Narrative   Performed at:  [address], [place], [place]  [id number] Lab Director: Lindon Romp MD, Phone:  [phone]  Hemoglobin A1c  Result Value Ref Range   Hgb A1c MFr Bld 5.6 4.8 - 5.6 %     Comment:              Pre-diabetes: 5.7 - 6.4          Diabetes: >6.4          Glycemic control for adults with diabetes: <7.0    Est. average glucose Bld gHb Est-mCnc 114 mg/dL   Narrative   Performed at:  [address], [place], [place]  [id number] Lab Director: Lindon Romp MD, Phone:  [phone]     If you have any questions or concerns, please don't hesitate to  call.  Sincerely,   Molli Barrows, FNP

## 2016-11-03 DIAGNOSIS — F331 Major depressive disorder, recurrent, moderate: Secondary | ICD-10-CM | POA: Diagnosis not present

## 2016-11-03 DIAGNOSIS — F411 Generalized anxiety disorder: Secondary | ICD-10-CM | POA: Diagnosis not present

## 2016-11-03 DIAGNOSIS — F4310 Post-traumatic stress disorder, unspecified: Secondary | ICD-10-CM | POA: Diagnosis not present

## 2016-11-18 DIAGNOSIS — R072 Precordial pain: Secondary | ICD-10-CM | POA: Diagnosis not present

## 2016-11-18 DIAGNOSIS — F411 Generalized anxiety disorder: Secondary | ICD-10-CM | POA: Diagnosis not present

## 2016-11-18 DIAGNOSIS — E663 Overweight: Secondary | ICD-10-CM | POA: Diagnosis not present

## 2016-11-18 DIAGNOSIS — R002 Palpitations: Secondary | ICD-10-CM | POA: Diagnosis not present

## 2016-11-22 ENCOUNTER — Other Ambulatory Visit (INDEPENDENT_AMBULATORY_CARE_PROVIDER_SITE_OTHER): Payer: BLUE CROSS/BLUE SHIELD

## 2016-11-22 DIAGNOSIS — Z0000 Encounter for general adult medical examination without abnormal findings: Secondary | ICD-10-CM | POA: Diagnosis not present

## 2016-11-22 LAB — LIPID PANEL
CHOL/HDL RATIO: 3
Cholesterol: 112 mg/dL (ref 0–200)
HDL: 41.8 mg/dL (ref >39.00)
LDL Cholesterol: 50 mg/dL (ref 0–99)
NONHDL: 69.92
Triglycerides: 101 mg/dL (ref 0.0–149.0)
VLDL: 20.2 mg/dL (ref 0.0–40.0)

## 2016-11-22 LAB — HEPATIC FUNCTION PANEL
ALBUMIN: 4 g/dL (ref 3.5–5.2)
ALT: 14 U/L (ref 0–35)
AST: 15 U/L (ref 0–37)
Alkaline Phosphatase: 55 U/L (ref 39–117)
Bilirubin, Direct: 0.1 mg/dL (ref 0.0–0.3)
Total Bilirubin: 0.4 mg/dL (ref 0.2–1.2)
Total Protein: 6.6 g/dL (ref 6.0–8.3)

## 2016-11-24 ENCOUNTER — Ambulatory Visit (INDEPENDENT_AMBULATORY_CARE_PROVIDER_SITE_OTHER): Payer: BLUE CROSS/BLUE SHIELD | Admitting: Internal Medicine

## 2016-11-24 ENCOUNTER — Encounter: Payer: Self-pay | Admitting: Internal Medicine

## 2016-11-24 VITALS — BP 110/70 | HR 67 | Temp 97.7°F | Ht 66.0 in | Wt 205.7 lb

## 2016-11-24 DIAGNOSIS — E049 Nontoxic goiter, unspecified: Secondary | ICD-10-CM | POA: Diagnosis not present

## 2016-11-24 DIAGNOSIS — Z0000 Encounter for general adult medical examination without abnormal findings: Secondary | ICD-10-CM | POA: Diagnosis not present

## 2016-11-24 DIAGNOSIS — R718 Other abnormality of red blood cells: Secondary | ICD-10-CM | POA: Diagnosis not present

## 2016-11-24 LAB — IRON,TIBC AND FERRITIN PANEL
%SAT: 41 % (ref 11–50)
Ferritin: 172 ng/mL (ref 10–232)
Iron: 111 ug/dL (ref 45–160)
TIBC: 270 ug/dL (ref 250–450)

## 2016-11-24 NOTE — Patient Instructions (Addendum)
Healthy weight loss will help  bp and other   Problems  Avoid Processed and fried  Simple carbs and sweets  . Keep appt with endocrinology   To follow your thyroid.  Your cholesterol level is favorable   Wt Readings from Last 3 Encounters:  11/24/16 205 lb 11.2 oz (93.3 kg)  10/30/16 204 lb (92.5 kg)  10/27/16 204 lb (92.5 kg)     Health Maintenance, Female Adopting a healthy lifestyle and getting preventive care can go a long way to promote health and wellness. Talk with your health care provider about what schedule of regular examinations is right for you. This is a good chance for you to check in with your provider about disease prevention and staying healthy. In between checkups, there are plenty of things you can do on your own. Experts have done a lot of research about which lifestyle changes and preventive measures are most likely to keep you healthy. Ask your health care provider for more information. Weight and diet Eat a healthy diet  Be sure to include plenty of vegetables, fruits, low-fat dairy products, and lean protein.  Do not eat a lot of foods high in solid fats, added sugars, or salt.  Get regular exercise. This is one of the most important things you can do for your health.  Most adults should exercise for at least 150 minutes each week. The exercise should increase your heart rate and make you sweat (moderate-intensity exercise).  Most adults should also do strengthening exercises at least twice a week. This is in addition to the moderate-intensity exercise. Maintain a healthy weight  Body mass index (BMI) is a measurement that can be used to identify possible weight problems. It estimates body fat based on height and weight. Your health care provider can help determine your BMI and help you achieve or maintain a healthy weight.  For females 46 years of age and older:  A BMI below 18.5 is considered underweight.  A BMI of 18.5 to 24.9 is normal.  A BMI of 25  to 29.9 is considered overweight.  A BMI of 30 and above is considered obese. Watch levels of cholesterol and blood lipids  You should start having your blood tested for lipids and cholesterol at 58 years of age, then have this test every 5 years.  You may need to have your cholesterol levels checked more often if:  Your lipid or cholesterol levels are high.  You are older than 58 years of age.  You are at high risk for heart disease. Cancer screening Lung Cancer  Lung cancer screening is recommended for adults 27-53 years old who are at high risk for lung cancer because of a history of smoking.  A yearly low-dose CT scan of the lungs is recommended for people who:  Currently smoke.  Have quit within the past 15 years.  Have at least a 30-pack-year history of smoking. A pack year is smoking an average of one pack of cigarettes a day for 1 year.  Yearly screening should continue until it has been 15 years since you quit.  Yearly screening should stop if you develop a health problem that would prevent you from having lung cancer treatment. Breast Cancer  Practice breast self-awareness. This means understanding how your breasts normally appear and feel.  It also means doing regular breast self-exams. Let your health care provider know about any changes, no matter how small.  If you are in your 20s or 30s, you should  have a clinical breast exam (CBE) by a health care provider every 1-3 years as part of a regular health exam.  If you are 92 or older, have a CBE every year. Also consider having a breast X-ray (mammogram) every year.  If you have a family history of breast cancer, talk to your health care provider about genetic screening.  If you are at high risk for breast cancer, talk to your health care provider about having an MRI and a mammogram every year.  Breast cancer gene (BRCA) assessment is recommended for women who have family members with BRCA-related cancers.  BRCA-related cancers include:  Breast.  Ovarian.  Tubal.  Peritoneal cancers.  Results of the assessment will determine the need for genetic counseling and BRCA1 and BRCA2 testing. Cervical Cancer  Your health care provider may recommend that you be screened regularly for cancer of the pelvic organs (ovaries, uterus, and vagina). This screening involves a pelvic examination, including checking for microscopic changes to the surface of your cervix (Pap test). You may be encouraged to have this screening done every 3 years, beginning at age 8.  For women ages 81-65, health care providers may recommend pelvic exams and Pap testing every 3 years, or they may recommend the Pap and pelvic exam, combined with testing for human papilloma virus (HPV), every 5 years. Some types of HPV increase your risk of cervical cancer. Testing for HPV may also be done on women of any age with unclear Pap test results.  Other health care providers may not recommend any screening for nonpregnant women who are considered low risk for pelvic cancer and who do not have symptoms. Ask your health care provider if a screening pelvic exam is right for you.  If you have had past treatment for cervical cancer or a condition that could lead to cancer, you need Pap tests and screening for cancer for at least 20 years after your treatment. If Pap tests have been discontinued, your risk factors (such as having a new sexual partner) need to be reassessed to determine if screening should resume. Some women have medical problems that increase the chance of getting cervical cancer. In these cases, your health care provider may recommend more frequent screening and Pap tests. Colorectal Cancer  This type of cancer can be detected and often prevented.  Routine colorectal cancer screening usually begins at 58 years of age and continues through 58 years of age.  Your health care provider may recommend screening at an earlier age if you  have risk factors for colon cancer.  Your health care provider may also recommend using home test kits to check for hidden blood in the stool.  A small camera at the end of a tube can be used to examine your colon directly (sigmoidoscopy or colonoscopy). This is done to check for the earliest forms of colorectal cancer.  Routine screening usually begins at age 51.  Direct examination of the colon should be repeated every 5-10 years through 58 years of age. However, you may need to be screened more often if early forms of precancerous polyps or small growths are found. Skin Cancer  Check your skin from head to toe regularly.  Tell your health care provider about any new moles or changes in moles, especially if there is a change in a mole's shape or color.  Also tell your health care provider if you have a mole that is larger than the size of a pencil eraser.  Always use sunscreen.  Apply sunscreen liberally and repeatedly throughout the day.  Protect yourself by wearing long sleeves, pants, a wide-brimmed hat, and sunglasses whenever you are outside. Heart disease, diabetes, and high blood pressure  High blood pressure causes heart disease and increases the risk of stroke. High blood pressure is more likely to develop in:  People who have blood pressure in the high end of the normal range (130-139/85-89 mm Hg).  People who are overweight or obese.  People who are African American.  If you are 43-16 years of age, have your blood pressure checked every 3-5 years. If you are 28 years of age or older, have your blood pressure checked every year. You should have your blood pressure measured twice-once when you are at a hospital or clinic, and once when you are not at a hospital or clinic. Record the average of the two measurements. To check your blood pressure when you are not at a hospital or clinic, you can use:  An automated blood pressure machine at a pharmacy.  A home blood pressure  monitor.  If you are between 51 years and 81 years old, ask your health care provider if you should take aspirin to prevent strokes.  Have regular diabetes screenings. This involves taking a blood sample to check your fasting blood sugar level.  If you are at a normal weight and have a low risk for diabetes, have this test once every three years after 58 years of age.  If you are overweight and have a high risk for diabetes, consider being tested at a younger age or more often. Preventing infection Hepatitis B  If you have a higher risk for hepatitis B, you should be screened for this virus. You are considered at high risk for hepatitis B if:  You were born in a country where hepatitis B is common. Ask your health care provider which countries are considered high risk.  Your parents were born in a high-risk country, and you have not been immunized against hepatitis B (hepatitis B vaccine).  You have HIV or AIDS.  You use needles to inject street drugs.  You live with someone who has hepatitis B.  You have had sex with someone who has hepatitis B.  You get hemodialysis treatment.  You take certain medicines for conditions, including cancer, organ transplantation, and autoimmune conditions. Hepatitis C  Blood testing is recommended for:  Everyone born from [birth year] through 1967.  Anyone with known risk factors for hepatitis C. Sexually transmitted infections (STIs)  You should be screened for sexually transmitted infections (STIs) including gonorrhea and chlamydia if:  You are sexually active and are younger than 58 years of age.  You are older than 58 years of age and your health care provider tells you that you are at risk for this type of infection.  Your sexual activity has changed since you were last screened and you are at an increased risk for chlamydia or gonorrhea. Ask your health care provider if you are at risk.  If you do not have HIV, but are at risk, it may be  recommended that you take a prescription medicine daily to prevent HIV infection. This is called pre-exposure prophylaxis (PrEP). You are considered at risk if:  You are sexually active and do not regularly use condoms or know the HIV status of your partner(s).  You take drugs by injection.  You are sexually active with a partner who has HIV. Talk with your health care provider about whether you  are at high risk of being infected with HIV. If you choose to begin PrEP, you should first be tested for HIV. You should then be tested every 3 months for as long as you are taking PrEP. Pregnancy  If you are premenopausal and you may become pregnant, ask your health care provider about preconception counseling.  If you may become pregnant, take 400 to 800 micrograms (mcg) of folic acid every day.  If you want to prevent pregnancy, talk to your health care provider about birth control (contraception). Osteoporosis and menopause  Osteoporosis is a disease in which the bones lose minerals and strength with aging. This can result in serious bone fractures. Your risk for osteoporosis can be identified using a bone density scan.  If you are 85 years of age or older, or if you are at risk for osteoporosis and fractures, ask your health care provider if you should be screened.  Ask your health care provider whether you should take a calcium or vitamin D supplement to lower your risk for osteoporosis.  Menopause may have certain physical symptoms and risks.  Hormone replacement therapy may reduce some of these symptoms and risks. Talk to your health care provider about whether hormone replacement therapy is right for you. Follow these instructions at home:  Schedule regular health, dental, and eye exams.  Stay current with your immunizations.  Do not use any tobacco products including cigarettes, chewing tobacco, or electronic cigarettes.  If you are pregnant, do not drink alcohol.  If you are  breastfeeding, limit how much and how often you drink alcohol.  Limit alcohol intake to no more than 1 drink per day for nonpregnant women. One drink equals 12 ounces of beer, 5 ounces of wine, or 1 ounces of hard liquor.  Do not use street drugs.  Do not share needles.  Ask your health care provider for help if you need support or information about quitting drugs.  Tell your health care provider if you often feel depressed.  Tell your health care provider if you have ever been abused or do not feel safe at home. This information is not intended to replace advice given to you by your health care provider. Make sure you discuss any questions you have with your health care provider. Document Released: 03/15/2011 Document Revised: 02/05/2016 Document Reviewed: 06/03/2015 [employer] Interactive Patient Education  2017 [employer].

## 2016-11-24 NOTE — Progress Notes (Signed)
Chief Complaint  Patient presents with  . Annual Exam    HPI: Patient  [name]  56 y.o. 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  And concern about fatigue  Cardiologist asked for iron studies but that didn't get communicated to  [place] for order .   Has appt with dr Lorane Gell endo  For gouiter sent byt  Other  pcp practice  Sent but   Paxil still he;lping   .  Nurse practitioner. Prescribes also  [facility] counseling   Dr Henreitta Leber   Was On lisinopril   Per cards   At first  Too low bp and now   5 mg   Hs gyne check    Health Maintenance  Topic Date Due  . PAP SMEAR  09/13/2016  . INFLUENZA VACCINE  02/10/2017 (Originally 04/13/2016)  . Hepatitis C Screening  11/24/2017 (Originally September 05, 1961)  . HIV Screening  11/24/2017 (Originally 06/06/1974)  . COLONOSCOPY  08/22/2017  . TETANUS/TDAP  09/13/2017  . MAMMOGRAM  08/31/2018   Health Maintenance Review LIFESTYLE:  Exercise:   Beginning  Tobacco/ETS: no Alcohol:   no Sugar beverages: stopped  Sleep:  was getting low   5 hours   Drug use: no   HH of  1   Work: own business  6 days   24 7 hours .     ROS: see above  Fatigue  GEN/ HEENT: No fever, significant weight changes sweats headaches vision problems hearing changes, CV/ PULM; No chest pain shortness of breath cough, syncope,edema  change in exercise tolerance. GI /GU: No adominal pain, vomiting, change in bowel habits. No blood in the stool. No significant GU symptoms. SKIN/HEME: ,no acute skin rashes suspicious lesions or bleeding. No lymphadenopathy, nodules, masses.  NEURO/ PSYCH:  No neurologic signs such as weakness numbness. No depression anxiety. IMM/ Allergy: No unusual infections.  Allergy .   REST of 12 system review negative except as per HPI   Past Medical  History:  Diagnosis Date  . Anemia   . Colon polyp 08/28/2012   Tubular adenoma  . Dense breast   . Genital warts   . GERD (gastroesophageal reflux disease)   . Hemorrhoids   . Hx of cardiac catheterization 2009   normal coronary arteries  . Hx of menorrhagia    Novasure  . Hypokalemia   . Hypomagnesemia   . Hypophosphatemia   . Lactose intolerance in adult   . Mitral valve prolapse   . Normal cardiac stress test 03/09/2012   no ischemia  . Positive PPD   . Rapid heart beat    States "irreg heart beat"  . Thyroid disease    Thyroid nodules    Past Surgical History:  Procedure Laterality Date  . CARDIAC CATHETERIZATION     x2 with normal results per pt  . NOVASURE ABLATION    . UPPER GASTROINTESTINAL ENDOSCOPY  May 2013    Family History  Problem Relation Age of Onset  . Heart disease Father     died Mi 72  . Hypertension Maternal Grandfather   . Diabetes Maternal Grandfather   . Heart disease Maternal Grandfather   . Colon cancer Maternal Grandfather   . Diabetes Maternal Grandmother     Social History  Social History  . Marital status: Divorced    Spouse name: N/A  . Number of children: 2  . Years of education: 16   Occupational History  . dialysis tech [employer]   Social History Main Topics  . Smoking status: Never Smoker  . Smokeless tobacco: Never Used  . Alcohol use No  . Drug use: No  . Sexual activity: Yes   Other Topics Concern  . None   Social History Narrative   [name] is a divorced [place] American female who works as a Teacher, early years/pre who has a number of specialists but no primary care physician. She lived in [place] and in [place] for a number of years her mom is from [place]. 16 years of education went to college at [employer] lives at home with her son who is in his 54s no pets   Neg ets tob etoh hx PA   6 hours of sleep   G2P2    TD2010  colonoscopy 2009   Now running  own business    [facility] [employer]  of 2           Outpatient Medications Prior to Visit  Medication Sig Dispense Refill  . cholecalciferol (VITAMIN D) 1000 UNITS tablet Take 2,000 Units by mouth every morning.     . metoprolol tartrate (LOPRESSOR) 25 MG tablet Take 25 mg by mouth 2 (two) times daily.  6  . PARoxetine (PAXIL-CR) 25 MG 24 hr tablet Take 25 mg by mouth every morning.     . potassium chloride SA (K-DUR,KLOR-CON) 20 MEQ tablet TAKE 1 TABLET(20 MEQ) BY MOUTH TWICE DAILY 180 tablet 0  . vitamin E 400 UNIT capsule Take 400 Units by mouth daily.    . Omega-3 Fatty Acids (FISH OIL PO) Take 2 capsules by mouth every other day.     . pantoprazole (PROTONIX) 40 MG tablet Take 40 mg by mouth daily as needed (acid reflux).     [name] azithromycin (ZITHROMAX) 250 MG tablet Sig as indicated 6 tablet 0  . HYDROcodone-homatropine (HYCODAN) 5-1.5 MG/5ML syrup Take 5 mLs by mouth every 6 (six) hours as needed for cough. 120 mL 0  . nitrofurantoin, macrocrystal-monohydrate, (MACROBID) 100 MG capsule Take 1 capsule (100 mg total) by mouth 2 (two) times daily. 14 capsule 0  . ondansetron (ZOFRAN ODT) 4 MG disintegrating tablet Take 1 tablet (4 mg total) by mouth every 8 (eight) hours as needed for nausea or vomiting. 20 tablet 0  . oseltamivir (TAMIFLU) 75 MG capsule Take 1 capsule (75 mg total) by mouth every 12 (twelve) hours. 10 capsule 0   Facility-Administered Medications Prior to Visit  Medication Dose Route Frequency Provider Last Rate Last Dose  . nitroGLYCERIN (NITROSTAT) SL tablet 0.3 mg  0.3 mg Sublingual Q5 min PRN Danielle Neas, PA-C   0.3 mg at 11/22/15 0951     EXAM:  BP 110/70 (BP Location: Right Arm, Patient Position: Sitting, Cuff Size: Normal)   Pulse 67   Temp 97.7 F (36.5 C) (Oral)   Ht 5\' 6"  (1.676 m)   Wt 205 lb 11.2 oz (93.3 kg)   BMI 33.20 kg/m   Body mass index is 33.2 kg/m. Wt Readings from Last 3 Encounters:  11/24/16 205 lb 11.2 oz (93.3 kg)  10/30/16 204 lb (92.5 kg)  10/27/16 204 lb  (92.5 kg)    Physical Exam: Vital signs reviewed ZOX:WRUE is a well-developed well-nourished alert cooperative    who appearsr stated  age in no acute distress.  Alert  Had to answer phone for business during exam HEENT: normocephalic atraumatic , Eyes: PERRL EOM's full, conjunctiva clear, Nares: paten,t no deformity discharge or tenderness., Ears: no deformity EAC's clear TMs with normal landmarks. Mouth: clear OP, no lesions, edema.  Moist mucous membranes. Dentition in adequate repair. NECK: supple without masses,or bruits. Thyroid palpable nontender  CHEST/PULM:  Clear to auscultation and percussion breath sounds equal no wheeze , rales or rhonchi. No chest wall deformities or tenderness. Breast: normal by inspection . No dimpling, discharge, masses, tenderness or discharge . Mild intertrigo  CV: PMI is nondisplaced, S1 S2 no gallops, murmurs, rubs. Peripheral pulses are full without delay.No JVD .  ABDOMEN: Bowel sounds normal nontender  No guard or rebound, no hepato splenomegal no CVA tenderness.  Extremtities:  No clubbing cyanosis or edema, no acute joint swelling or redness no focal atrophy NEURO:  Oriented x3, cranial nerves 3-12 appear to be intact, no obvious focal weakness,gait within normal limits no abnormal reflexes or asymmetrical SKIN: No acute rashes normal turgor, color, no bruising or petechiae.  Hypo pig are left arm  PSYCH: Oriented, good eye contact, no obvious depression anxiety, cognition and judgment appear normal. LN: no cervical axillary inguinal adenopathy  Lab Results  Component Value Date   WBC 8.8 10/27/2016   HGB 12.7 10/13/2016   HCT 40.1 10/27/2016   PLT 219 10/27/2016   GLUCOSE 109 (H) 10/27/2016   CHOL 112 11/22/2016   TRIG 101.0 11/22/2016   HDL 41.80 11/22/2016   LDLCALC 50 11/22/2016   ALT 14 11/22/2016   AST 15 11/22/2016   NA 143 10/27/2016   K 3.8 10/27/2016   CL 102 10/27/2016   CREATININE 0.79 10/27/2016   BUN 8 10/27/2016   CO2 25  10/27/2016   TSH 1.080 10/30/2016   INR 0.95 03/10/2012   HGBA1C 5.6 10/30/2016    BP Readings from Last 3 Encounters:  11/24/16 110/70  10/30/16 110/70  10/29/16 130/82   Wt Readings from Last 3 Encounters:  11/24/16 205 lb 11.2 oz (93.3 kg)  10/30/16 204 lb (92.5 kg)  10/27/16 204 lb (92.5 kg)     Lab results reviewed with patient   ASSESSMENT AND PLAN:  Discussed the following assessment and plan:  Visit for preventive health examination  RBC microcytosis - Plan: Iron, TIBC and Ferritin Panel, CBC with Differential/Platelet  Enlarged thyroid Has goiter    Follow   Dis healthy lsi  And meds    Local care  Prevention of intertrigo .  Etc may have sensitive skin.  3 draw blood for iron panel and ferritin that she has microcytosis but elevated RBC this could be a hemoglobinopathy will check iron status. She will follow-up with endocrinology in regard to her goiter although I don't think that is causing her fatigue. At this time she will stay with [facility] as a prescriber until things are more stable but appears to be doing Bevelyn Ngo as her Paxil medication. Agree healthy weight loss will help a number of her medical symptoms. She agrees. 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 And she is aware.   Patient Care Team: Madelin Headings, MD as PCP - General (Internal Medicine) Orpah Cobb, MD as Attending Physician (Internal Medicine) [facility] Huel Cote, MD (Obstetrics and Gynecology) Patient Instructions   Healthy weight loss will help  bp and other   Problems  Avoid  Processed and fried  Simple carbs and sweets  . Keep appt with endocrinology   To follow your thyroid.  Your cholesterol level is favorable   Wt Readings from Last 3 Encounters:  11/24/16 205 lb 11.2 oz (93.3 kg)  10/30/16 204 lb (92.5 kg)  10/27/16 204 lb (92.5 kg)     Health Maintenance, Female Adopting a healthy  lifestyle and getting preventive care can go a long way to promote health and wellness. Talk with your health care provider about what schedule of regular examinations is right for you. This is a good chance for you to check in with your provider about disease prevention and staying healthy. In between checkups, there are plenty of things you can do on your own. Experts have done a lot of research about which lifestyle changes and preventive measures are most likely to keep you healthy. Ask your health care provider for more information. Weight and diet Eat a healthy diet  Be sure to include plenty of vegetables, fruits, low-fat dairy products, and lean protein.  Do not eat a lot of foods high in solid fats, added sugars, or salt.  Get regular exercise. This is one of the most important things you can do for your health.  Most adults should exercise for at least 150 minutes each week. The exercise should increase your heart rate and make you sweat (moderate-intensity exercise).  Most adults should also do strengthening exercises at least twice a week. This is in addition to the moderate-intensity exercise. Maintain a healthy weight  Body mass index (BMI) is a measurement that can be used to identify possible weight problems. It estimates body fat based on height and weight. Your health care provider can help determine your BMI and help you achieve or maintain a healthy weight.  For females 6 years of age and older:  A BMI below 18.5 is considered underweight.  A BMI of 18.5 to 24.9 is normal.  A BMI of 25 to 29.9 is considered overweight.  A BMI of 30 and above is considered obese. Watch levels of cholesterol and blood lipids  You should start having your blood tested for lipids and cholesterol at 58 years of age, then have this test every 5 years.  You may need to have your cholesterol levels checked more often if:  Your lipid or cholesterol levels are high.  You are older than  58 years of age.  You are at high risk for heart disease. Cancer screening Lung Cancer  Lung cancer screening is recommended for adults 85-82 years old who are at high risk for lung cancer because of a history of smoking.  A yearly low-dose CT scan of the lungs is recommended for people who:  Currently smoke.  Have quit within the past 15 years.  Have at least a 30-pack-year history of smoking. A pack year is smoking an average of one pack of cigarettes a day for 1 year.  Yearly screening should continue until it has been 15 years since you quit.  Yearly screening should stop if you develop a health problem that would prevent you from having lung cancer treatment. Breast Cancer  Practice breast self-awareness. This means understanding how your breasts normally appear and feel.  It also means doing regular breast self-exams. Let your health care provider know about any changes, no matter how small.  If you are in your 20s or 30s, you should have a clinical breast exam (CBE) by a health care provider every 1-3  years as part of a regular health exam.  If you are 77 or older, have a CBE every year. Also consider having a breast X-ray (mammogram) every year.  If you have a family history of breast cancer, talk to your health care provider about genetic screening.  If you are at high risk for breast cancer, talk to your health care provider about having an MRI and a mammogram every year.  Breast cancer gene (BRCA) assessment is recommended for women who have family members with BRCA-related cancers. BRCA-related cancers include:  Breast.  Ovarian.  Tubal.  Peritoneal cancers.  Results of the assessment will determine the need for genetic counseling and BRCA1 and BRCA2 testing. Cervical Cancer  Your health care provider may recommend that you be screened regularly for cancer of the pelvic organs (ovaries, uterus, and vagina). This screening involves a pelvic examination,  including checking for microscopic changes to the surface of your cervix (Pap test). You may be encouraged to have this screening done every 3 years, beginning at age 79.  For women ages 57-65, health care providers may recommend pelvic exams and Pap testing every 3 years, or they may recommend the Pap and pelvic exam, combined with testing for human papilloma virus (HPV), every 5 years. Some types of HPV increase your risk of cervical cancer. Testing for HPV may also be done on women of any age with unclear Pap test results.  Other health care providers may not recommend any screening for nonpregnant women who are considered low risk for pelvic cancer and who do not have symptoms. Ask your health care provider if a screening pelvic exam is right for you.  If you have had past treatment for cervical cancer or a condition that could lead to cancer, you need Pap tests and screening for cancer for at least 20 years after your treatment. If Pap tests have been discontinued, your risk factors (such as having a new sexual partner) need to be reassessed to determine if screening should resume. Some women have medical problems that increase the chance of getting cervical cancer. In these cases, your health care provider may recommend more frequent screening and Pap tests. Colorectal Cancer  This type of cancer can be detected and often prevented.  Routine colorectal cancer screening usually begins at 58 years of age and continues through 58 years of age.  Your health care provider may recommend screening at an earlier age if you have risk factors for colon cancer.  Your health care provider may also recommend using home test kits to check for hidden blood in the stool.  A small camera at the end of a tube can be used to examine your colon directly (sigmoidoscopy or colonoscopy). This is done to check for the earliest forms of colorectal cancer.  Routine screening usually begins at age 5.  Direct  examination of the colon should be repeated every 5-10 years through 58 years of age. However, you may need to be screened more often if early forms of precancerous polyps or small growths are found. Skin Cancer  Check your skin from head to toe regularly.  Tell your health care provider about any new moles or changes in moles, especially if there is a change in a mole's shape or color.  Also tell your health care provider if you have a mole that is larger than the size of a pencil eraser.  Always use sunscreen. Apply sunscreen liberally and repeatedly throughout the day.  Protect yourself by wearing  long sleeves, pants, a wide-brimmed hat, and sunglasses whenever you are outside. Heart disease, diabetes, and high blood pressure  High blood pressure causes heart disease and increases the risk of stroke. High blood pressure is more likely to develop in:  People who have blood pressure in the high end of the normal range (130-139/85-89 mm Hg).  People who are overweight or obese.  People who are African American.  If you are 70-69 years of age, have your blood pressure checked every 3-5 years. If you are 84 years of age or older, have your blood pressure checked every year. You should have your blood pressure measured twice-once when you are at a hospital or clinic, and once when you are not at a hospital or clinic. Record the average of the two measurements. To check your blood pressure when you are not at a hospital or clinic, you can use:  An automated blood pressure machine at a pharmacy.  A home blood pressure monitor.  If you are between 83 years and 87 years old, ask your health care provider if you should take aspirin to prevent strokes.  Have regular diabetes screenings. This involves taking a blood sample to check your fasting blood sugar level.  If you are at a normal weight and have a low risk for diabetes, have this test once every three years after 58 years of age.  If  you are overweight and have a high risk for diabetes, consider being tested at a younger age or more often. Preventing infection Hepatitis B  If you have a higher risk for hepatitis B, you should be screened for this virus. You are considered at high risk for hepatitis B if:  You were born in a country where hepatitis B is common. Ask your health care provider which countries are considered high risk.  Your parents were born in a high-risk country, and you have not been immunized against hepatitis B (hepatitis B vaccine).  You have HIV or AIDS.  You use needles to inject street drugs.  You live with someone who has hepatitis B.  You have had sex with someone who has hepatitis B.  You get hemodialysis treatment.  You take certain medicines for conditions, including cancer, organ transplantation, and autoimmune conditions. Hepatitis C  Blood testing is recommended for:  Everyone born from [birth year] through 1967.  Anyone with known risk factors for hepatitis C. Sexually transmitted infections (STIs)  You should be screened for sexually transmitted infections (STIs) including gonorrhea and chlamydia if:  You are sexually active and are younger than 58 years of age.  You are older than 58 years of age and your health care provider tells you that you are at risk for this type of infection.  Your sexual activity has changed since you were last screened and you are at an increased risk for chlamydia or gonorrhea. Ask your health care provider if you are at risk.  If you do not have HIV, but are at risk, it may be recommended that you take a prescription medicine daily to prevent HIV infection. This is called pre-exposure prophylaxis (PrEP). You are considered at risk if:  You are sexually active and do not regularly use condoms or know the HIV status of your partner(s).  You take drugs by injection.  You are sexually active with a partner who has HIV. Talk with your health care  provider about whether you are at high risk of being infected with HIV. If you choose to  begin PrEP, you should first be tested for HIV. You should then be tested every 3 months for as long as you are taking PrEP. Pregnancy  If you are premenopausal and you may become pregnant, ask your health care provider about preconception counseling.  If you may become pregnant, take 400 to 800 micrograms (mcg) of folic acid every day.  If you want to prevent pregnancy, talk to your health care provider about birth control (contraception). Osteoporosis and menopause  Osteoporosis is a disease in which the bones lose minerals and strength with aging. This can result in serious bone fractures. Your risk for osteoporosis can be identified using a bone density scan.  If you are 13 years of age or older, or if you are at risk for osteoporosis and fractures, ask your health care provider if you should be screened.  Ask your health care provider whether you should take a calcium or vitamin D supplement to lower your risk for osteoporosis.  Menopause may have certain physical symptoms and risks.  Hormone replacement therapy may reduce some of these symptoms and risks. Talk to your health care provider about whether hormone replacement therapy is right for you. Follow these instructions at home:  Schedule regular health, dental, and eye exams.  Stay current with your immunizations.  Do not use any tobacco products including cigarettes, chewing tobacco, or electronic cigarettes.  If you are pregnant, do not drink alcohol.  If you are breastfeeding, limit how much and how often you drink alcohol.  Limit alcohol intake to no more than 1 drink per day for nonpregnant women. One drink equals 12 ounces of beer, 5 ounces of wine, or 1 ounces of hard liquor.  Do not use street drugs.  Do not share needles.  Ask your health care provider for help if you need support or information about quitting  drugs.  Tell your health care provider if you often feel depressed.  Tell your health care provider if you have ever been abused or do not feel safe at home. This information is not intended to replace advice given to you by your health care provider. Make sure you discuss any questions you have with your health care provider. Document Released: 03/15/2011 Document Revised: 02/05/2016 Document Reviewed: 06/03/2015 Elsevier Interactive Patient Education  2017 [employer].    [place]. Annis Lagoy M.D.

## 2016-11-25 LAB — CBC WITH DIFFERENTIAL/PLATELET
BASOS ABS: 0.1 10*3/uL (ref 0.0–0.1)
Basophils Relative: 0.9 % (ref 0.0–3.0)
Eosinophils Absolute: 0.1 10*3/uL (ref 0.0–0.7)
Eosinophils Relative: 1.3 % (ref 0.0–5.0)
HCT: 40.4 % (ref 36.0–46.0)
HEMOGLOBIN: 12.8 g/dL (ref 12.0–15.0)
LYMPHS ABS: 1.8 10*3/uL (ref 0.7–4.0)
Lymphocytes Relative: 21.4 % (ref 12.0–46.0)
MCHC: 31.8 g/dL (ref 30.0–36.0)
MCV: 75.8 fl — AB (ref 78.0–100.0)
MONO ABS: 1.2 10*3/uL — AB (ref 0.1–1.0)
MONOS PCT: 14.2 % — AB (ref 3.0–12.0)
NEUTROS PCT: 62.2 % (ref 43.0–77.0)
Neutro Abs: 5.3 10*3/uL (ref 1.4–7.7)
Platelets: 221 10*3/uL (ref 150.0–400.0)
RBC: 5.33 Mil/uL — AB (ref 3.87–5.11)
RDW: 14.8 % (ref 11.5–15.5)
WBC: 8.4 10*3/uL (ref 4.0–10.5)

## 2016-12-03 ENCOUNTER — Ambulatory Visit: Payer: BLUE CROSS/BLUE SHIELD | Admitting: Endocrinology

## 2016-12-23 DIAGNOSIS — E663 Overweight: Secondary | ICD-10-CM | POA: Diagnosis not present

## 2016-12-23 DIAGNOSIS — F411 Generalized anxiety disorder: Secondary | ICD-10-CM | POA: Diagnosis not present

## 2016-12-23 DIAGNOSIS — R002 Palpitations: Secondary | ICD-10-CM | POA: Diagnosis not present

## 2016-12-23 DIAGNOSIS — R072 Precordial pain: Secondary | ICD-10-CM | POA: Diagnosis not present

## 2016-12-28 ENCOUNTER — Ambulatory Visit: Payer: BLUE CROSS/BLUE SHIELD | Admitting: Endocrinology

## 2016-12-29 ENCOUNTER — Other Ambulatory Visit: Payer: Self-pay | Admitting: Internal Medicine

## 2017-01-11 DIAGNOSIS — L989 Disorder of the skin and subcutaneous tissue, unspecified: Secondary | ICD-10-CM | POA: Diagnosis not present

## 2017-01-12 ENCOUNTER — Ambulatory Visit (INDEPENDENT_AMBULATORY_CARE_PROVIDER_SITE_OTHER): Payer: BLUE CROSS/BLUE SHIELD | Admitting: Endocrinology

## 2017-01-12 ENCOUNTER — Encounter: Payer: Self-pay | Admitting: Endocrinology

## 2017-01-12 VITALS — BP 106/70 | HR 87 | Ht 66.0 in | Wt 202.0 lb

## 2017-01-12 DIAGNOSIS — E042 Nontoxic multinodular goiter: Secondary | ICD-10-CM | POA: Diagnosis not present

## 2017-01-12 LAB — MAGNESIUM: Magnesium: 2 mg/dL (ref 1.5–2.5)

## 2017-01-12 LAB — VITAMIN D 25 HYDROXY (VIT D DEFICIENCY, FRACTURES): VITD: 26.44 ng/mL — ABNORMAL LOW (ref 30.00–100.00)

## 2017-01-12 NOTE — Progress Notes (Signed)
Subjective:    Patient ID: [name], female    DOB: [birth year], 56 y.o.   MRN: [id number]  HPI Pt is referred by Dr Regis Bill, for hypocalcemia and fatigue.  Pt was noted to have hypocalcemia in 2015.  She denies h/o the following: bariatric surgery, renal disease, seizures, pancreatitis, heart disease, cancer, osteoporosis, malabsorption, eating disorder, bony fx, neck surgery, and chelation rx.  She inconsistently takes vit-D, 2000 units/day.  She has few months of slight cramps in the feet, and assoc fatigue.   Pt also has h/o multinodular goiter (dx'ed 2007: bx in 2440 showed [place]; most recent US in 2013 showed minimal change; she has been euthyroid off any rx).   Past Medical History:  Diagnosis Date  . Anemia   . Colon polyp 08/28/2012   Tubular adenoma  . Dense breast   . Genital warts   . GERD (gastroesophageal reflux disease)   . Hemorrhoids   . Hx of cardiac catheterization 2009   normal coronary arteries  . Hx of menorrhagia    Novasure  . Hypokalemia   . Hypomagnesemia   . Hypophosphatemia   . Lactose intolerance in adult   . Mitral valve prolapse   . Normal cardiac stress test 03/09/2012   no ischemia  . Positive PPD   . Rapid heart beat    States "irreg heart beat"  . Thyroid disease    Thyroid nodules    Past Surgical History:  Procedure Laterality Date  . CARDIAC CATHETERIZATION     x2 with normal results per pt  . NOVASURE ABLATION    . UPPER GASTROINTESTINAL ENDOSCOPY  May 2013    Social History   Social History  . Marital status: Divorced    Spouse name: N/A  . Number of children: 2  . Years of education: 16   Occupational History  . dialysis tech [employer]   Social History Main Topics  . Smoking status: Never Smoker  . Smokeless tobacco: Never Used  . Alcohol use No  . Drug use: No  . Sexual activity: Yes   Other Topics Concern  . Not on file   Social History Narrative   Ms. 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 She lived in [place] and in [place] for a number of years her mom is from [place]. 16 years of education went to college at [employer] lives at home with her son who is in his 56s no pets   Neg ets tob etoh hx PA   6 hours of sleep   G2P2    TD2010  colonoscopy 2009   Now running  own business    [facility] of 2           Current Outpatient Prescriptions on File Prior to Visit  Medication Sig Dispense Refill  . cholecalciferol (VITAMIN D) 1000 UNITS tablet Take 2,000 Units by mouth every morning.     . metoprolol tartrate (LOPRESSOR) 25 MG tablet Take 25 mg by mouth 2 (two) times daily.  6  . Omega-3 Fatty Acids (FISH OIL PO) Take 2 capsules by mouth every other day.     . pantoprazole (PROTONIX) 40 MG tablet Take 40 mg by mouth daily as needed (acid reflux).     [name] PARoxetine (PAXIL-CR) 25 MG 24 hr tablet Take 25 mg by mouth every morning.     [name]  potassium chloride SA (K-DUR,KLOR-CON) 20 MEQ tablet TAKE 1 TABLET(20 MEQ) BY MOUTH TWICE DAILY 180 tablet 2  . vitamin E 400 UNIT capsule Take 400 Units by mouth daily.    . [DISCONTINUED] mometasone (NASONEX) 50 MCG/ACT nasal spray Place 2 sprays into the nose daily. (Patient not taking: Reported on 10/17/2015) 17 g 0   Current Facility-Administered Medications on File Prior to Visit  Medication Dose Route Frequency Provider Last Rate Last Dose  . nitroGLYCERIN (NITROSTAT) SL tablet 0.3 mg  0.3 mg Sublingual Q5 min PRN Tereasa Coop, PA-C   0.3 mg at 11/22/15 0951    Allergies  Allergen Reactions  . Ceftriaxone Sodium Itching  . Doxycycline Rash    Family History  Problem Relation Age of Onset  . Heart disease Father     died Mi 74  . Hypertension Maternal Grandfather   . Diabetes Maternal Grandfather   . Heart disease Maternal Grandfather   . Colon cancer Maternal Grandfather   . Diabetes Maternal Grandmother       BP 106/70   Pulse 87   Ht 5\' 6"  (1.676 m)   Wt 202 lb (91.6 kg)   SpO2 98%   BMI 32.60 kg/m    Review of Systems denies muscle weakness, n/v, syncope, weight change, cold intolerance, palpitations, rash, diarrhea, sob, numbness, fever, urinary frequency, rhinorrhea, easy bruising, and blurry vision.     Objective:   Physical Exam VS: see vs page GEN: no distress HEAD: head: no deformity eyes: no periorbital swelling, no proptosis external nose and ears are normal mouth: no lesion seen NECK: 3 cm right thyroid nodule is easily noted CHEST WALL: no deformity LUNGS: clear to auscultation CV: reg rate and rhythm, no murmur ABD: abdomen is soft, nontender.  no hepatosplenomegaly.  not distended.  no hernia MUSCULOSKELETAL: muscle bulk and strength are grossly normal.  no obvious joint swelling.  gait is normal and steady EXTEMITIES: no deformity.  no edema PULSES: no carotid bruit NEURO:  cn 2-12 grossly intact.   readily moves all 4's.  sensation is intact to touch on all 4's.  SKIN:  Normal texture and temperature.  No rash or suspicious lesion is visible.   NODES:  None palpable at the neck.  PSYCH: alert, well-oriented.  Does not appear anxious nor depressed.     Lab Results  Component Value Date   PTH 21 01/12/2017   CALCIUM 9.2 01/12/2017   CAION 1.17 01/12/2015   PHOS 2.2 (L) 10/26/2012   I have reviewed outside records, and summarized: Pt was noted to have fatigue sxs, and referred here.  She was also note to be due for f/u of goiter.      Assessment & Plan:  Fatigue: no endocrine cause is found.   Foot cramps, no endocrine cause is found Multinodular goiter: due for recheck.  Patient Instructions  Let's recheck the ultrasound.  you will receive a phone call, about a day and time for an appointment. blood tests are requested for you today.  We'll let you know about the results. I would be happy to see you back here as needed.

## 2017-01-12 NOTE — Patient Instructions (Signed)
Let's recheck the ultrasound.  you will receive a phone call, about a day and time for an appointment. blood tests are requested for you today.  We'll let you know about the results. I would be happy to see you back here as needed.

## 2017-01-13 LAB — PTH, INTACT AND CALCIUM
CALCIUM: 9.2 mg/dL (ref 8.6–10.4)
PTH: 21 pg/mL (ref 14–64)

## 2017-01-19 ENCOUNTER — Other Ambulatory Visit: Payer: BLUE CROSS/BLUE SHIELD

## 2017-01-20 DIAGNOSIS — M542 Cervicalgia: Secondary | ICD-10-CM | POA: Diagnosis not present

## 2017-01-20 DIAGNOSIS — F411 Generalized anxiety disorder: Secondary | ICD-10-CM | POA: Diagnosis not present

## 2017-01-20 DIAGNOSIS — R002 Palpitations: Secondary | ICD-10-CM | POA: Diagnosis not present

## 2017-01-20 DIAGNOSIS — E663 Overweight: Secondary | ICD-10-CM | POA: Diagnosis not present

## 2017-01-27 ENCOUNTER — Other Ambulatory Visit: Payer: BLUE CROSS/BLUE SHIELD

## 2017-01-28 ENCOUNTER — Other Ambulatory Visit: Payer: BLUE CROSS/BLUE SHIELD

## 2017-02-01 ENCOUNTER — Other Ambulatory Visit: Payer: BLUE CROSS/BLUE SHIELD

## 2017-02-01 DIAGNOSIS — E663 Overweight: Secondary | ICD-10-CM | POA: Diagnosis not present

## 2017-02-01 DIAGNOSIS — F411 Generalized anxiety disorder: Secondary | ICD-10-CM | POA: Diagnosis not present

## 2017-02-01 DIAGNOSIS — R002 Palpitations: Secondary | ICD-10-CM | POA: Diagnosis not present

## 2017-02-01 DIAGNOSIS — R072 Precordial pain: Secondary | ICD-10-CM | POA: Diagnosis not present

## 2017-02-04 IMAGING — CR DG CHEST 2V
2 series · 2 of 2 positions shown · non-contrast
Comparison: Chest x-ray 05/05/2016.

CLINICAL DATA: 54-year-old female with history of left-sided chest
pain. Prior history of cardiac catheterization. Known history of
mitral valve prolapse.

EXAM:
CHEST  2 VIEW

[chest pa]
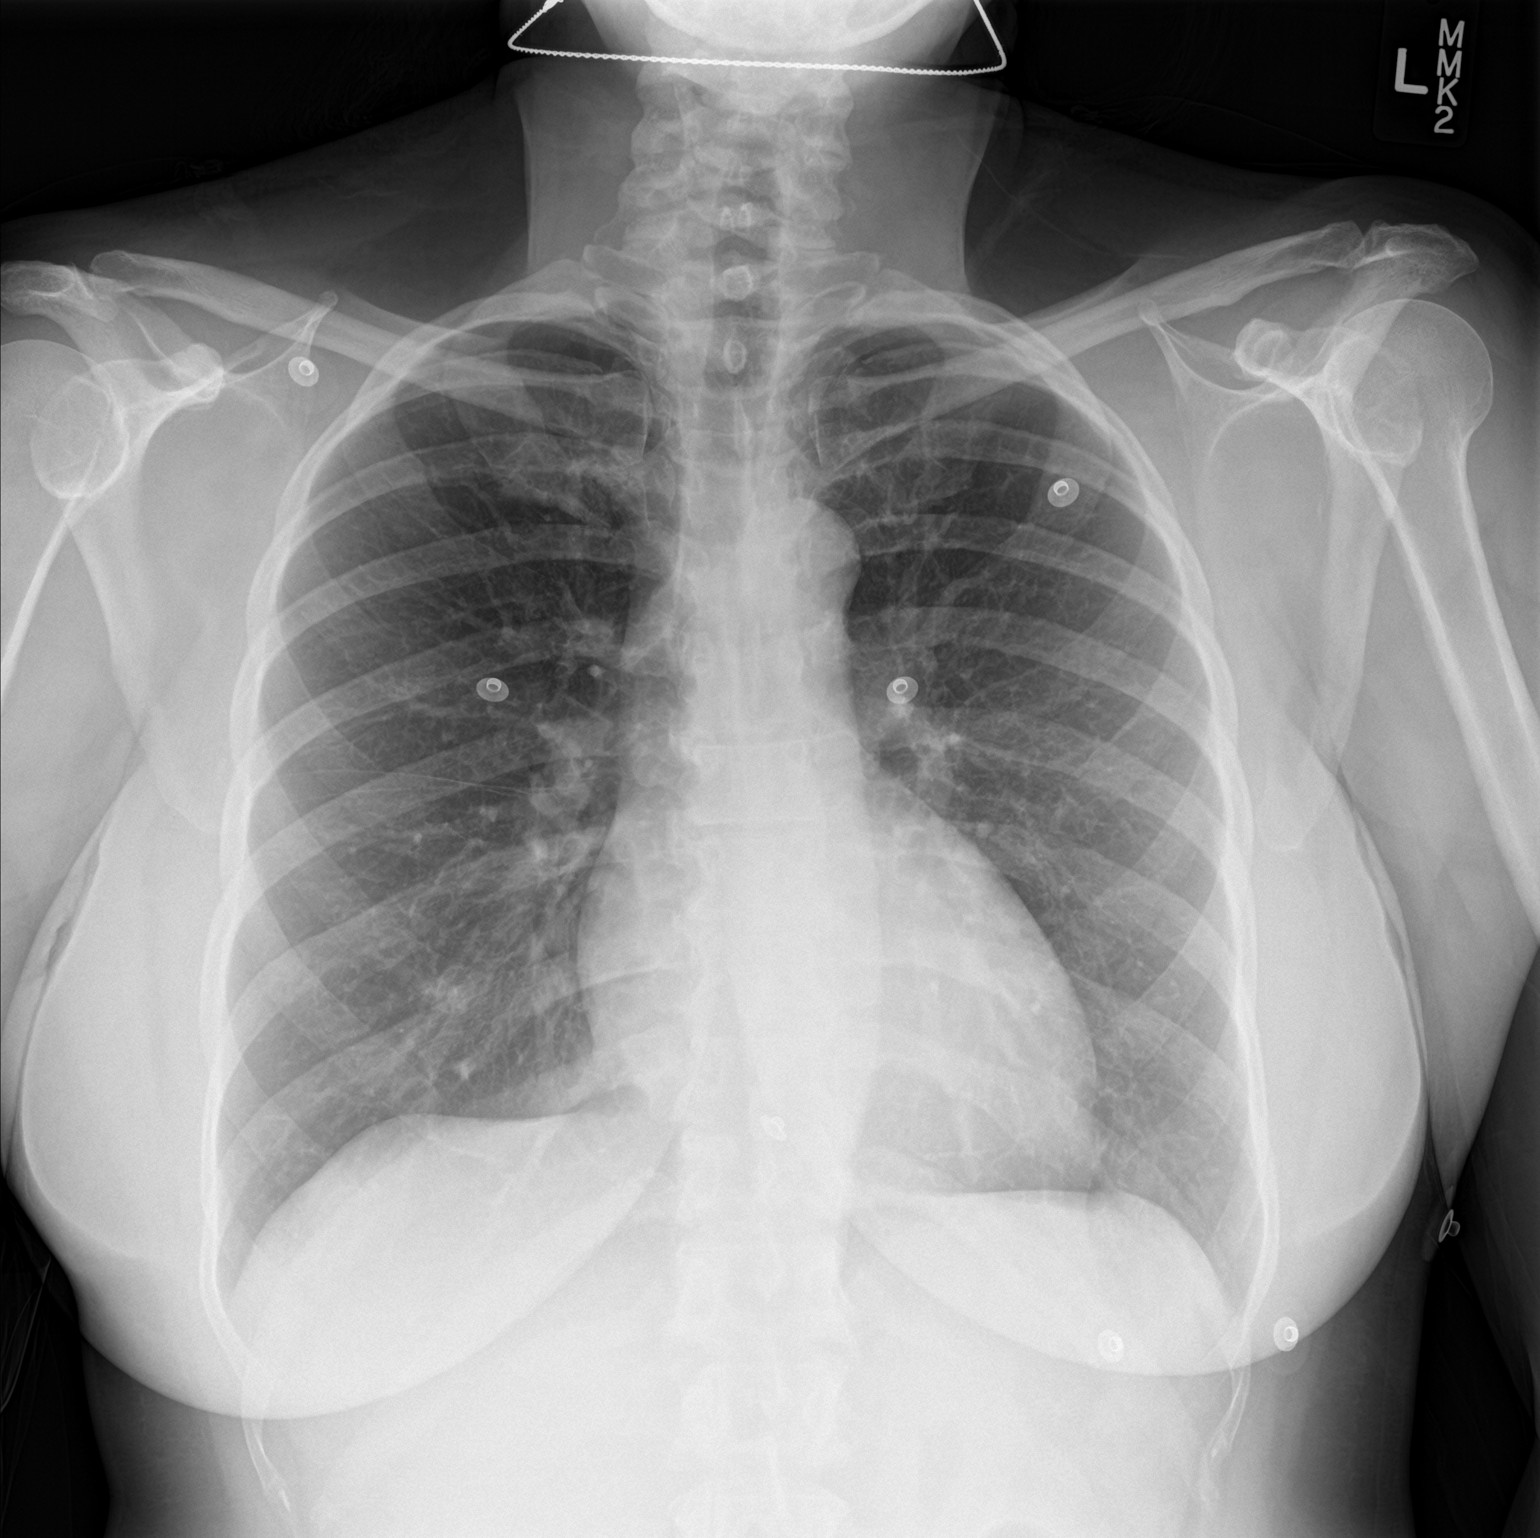

[chest lat]
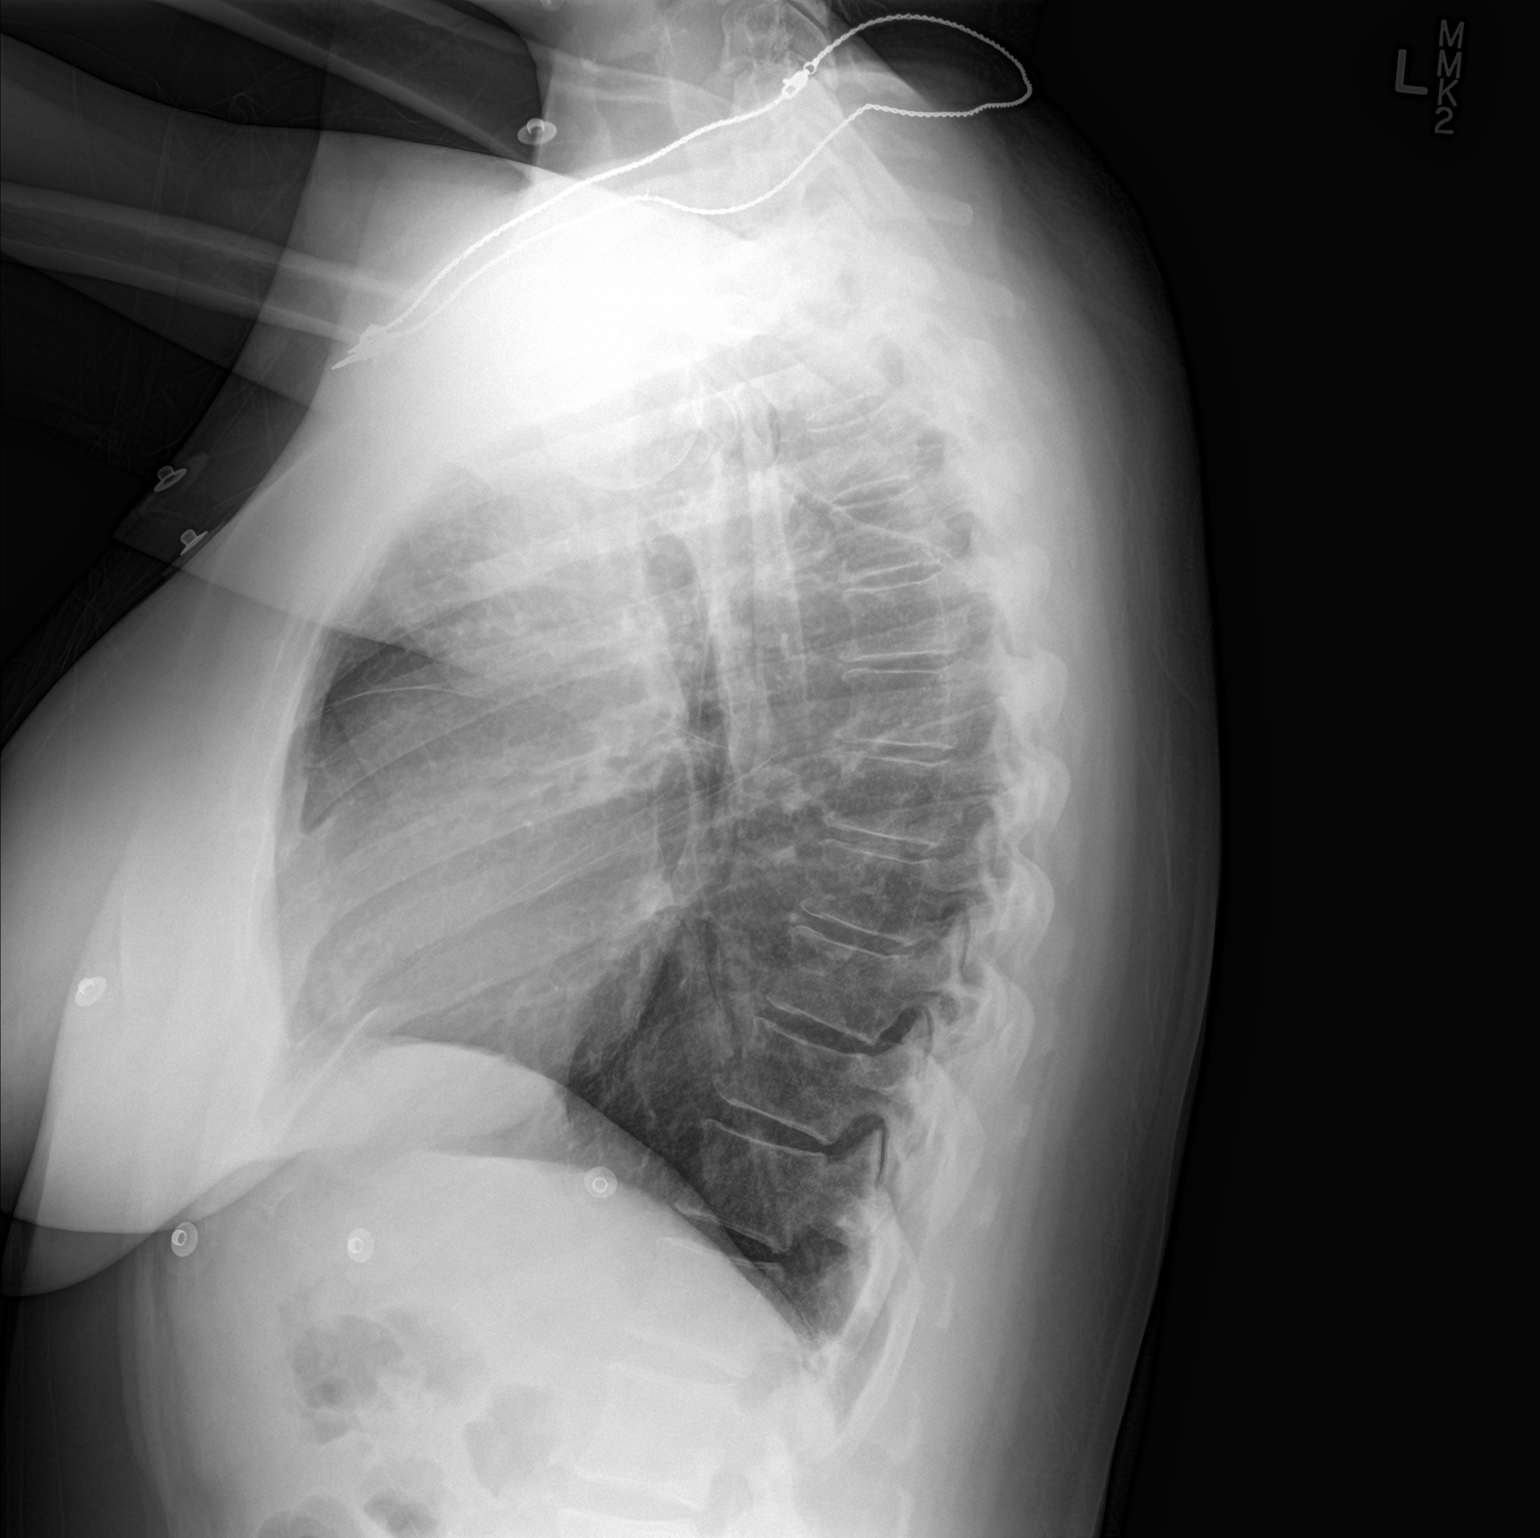

[2 of 2 positions shown; findings below may reference images not displayed]

FINDINGS: Lung volumes are normal. No consolidative airspace disease. No
pleural effusions. No pneumothorax. No pulmonary nodule or mass
noted. Pulmonary vasculature and the cardiomediastinal silhouette
are within normal limits.
IMPRESSION: No radiographic evidence of acute cardiopulmonary disease.

## 2017-02-15 DIAGNOSIS — Z1389 Encounter for screening for other disorder: Secondary | ICD-10-CM | POA: Diagnosis not present

## 2017-02-15 DIAGNOSIS — N959 Unspecified menopausal and perimenopausal disorder: Secondary | ICD-10-CM | POA: Diagnosis not present

## 2017-02-15 DIAGNOSIS — N6324 Unspecified lump in the left breast, lower inner quadrant: Secondary | ICD-10-CM | POA: Diagnosis not present

## 2017-02-15 DIAGNOSIS — Z124 Encounter for screening for malignant neoplasm of cervix: Secondary | ICD-10-CM | POA: Diagnosis not present

## 2017-02-15 DIAGNOSIS — Z1151 Encounter for screening for human papillomavirus (HPV): Secondary | ICD-10-CM | POA: Diagnosis not present

## 2017-02-15 DIAGNOSIS — Z01419 Encounter for gynecological examination (general) (routine) without abnormal findings: Secondary | ICD-10-CM | POA: Diagnosis not present

## 2017-02-15 DIAGNOSIS — Z6832 Body mass index (BMI) 32.0-32.9, adult: Secondary | ICD-10-CM | POA: Diagnosis not present

## 2017-02-17 ENCOUNTER — Other Ambulatory Visit: Payer: Self-pay | Admitting: Obstetrics and Gynecology

## 2017-02-17 DIAGNOSIS — N63 Unspecified lump in unspecified breast: Secondary | ICD-10-CM

## 2017-02-18 DIAGNOSIS — E663 Overweight: Secondary | ICD-10-CM | POA: Diagnosis not present

## 2017-02-18 DIAGNOSIS — R002 Palpitations: Secondary | ICD-10-CM | POA: Diagnosis not present

## 2017-02-18 DIAGNOSIS — F411 Generalized anxiety disorder: Secondary | ICD-10-CM | POA: Diagnosis not present

## 2017-02-18 DIAGNOSIS — R072 Precordial pain: Secondary | ICD-10-CM | POA: Diagnosis not present

## 2017-02-24 ENCOUNTER — Other Ambulatory Visit: Payer: BLUE CROSS/BLUE SHIELD

## 2017-02-28 ENCOUNTER — Other Ambulatory Visit: Payer: BLUE CROSS/BLUE SHIELD

## 2017-03-01 ENCOUNTER — Other Ambulatory Visit: Payer: BLUE CROSS/BLUE SHIELD

## 2017-03-02 ENCOUNTER — Ambulatory Visit
Admission: RE | Admit: 2017-03-02 | Discharge: 2017-03-02 | Disposition: A | Discharge status: Home / Self Care | Payer: BLUE CROSS/BLUE SHIELD | Source: Ambulatory Visit | Attending: Endocrinology | Admitting: Endocrinology

## 2017-03-02 DIAGNOSIS — E042 Nontoxic multinodular goiter: Secondary | ICD-10-CM | POA: Diagnosis not present

## 2017-03-04 ENCOUNTER — Other Ambulatory Visit: Payer: BLUE CROSS/BLUE SHIELD

## 2017-03-09 ENCOUNTER — Telehealth: Payer: Self-pay | Admitting: Internal Medicine

## 2017-03-09 NOTE — Telephone Encounter (Signed)
Patient calling back RE ultrasound of head and neck to look at thyroid nodules ordered by Dr. Loanne Drilling.   Results back 06/20.  Thank you,  -LL

## 2017-03-17 ENCOUNTER — Telehealth: Payer: Self-pay | Admitting: Endocrinology

## 2017-03-17 NOTE — Telephone Encounter (Signed)
Written by Renato Shin, MD on 03/03/2017 1:33 PM  Hi [name]:  The right nodule has enlarged, so we should repeat the biopsy. As you remember from last time, it is easy. Please call our office to get an appointment. I hope you feel well.  I contacted the patient and advised of mychart message above. Patient stated she did not like mychart and would not like to receive test results from the portal. Patient agreed to the biopsy and will come tomorrow at 945 for her biopsy.

## 2017-03-17 NOTE — Telephone Encounter (Signed)
Please call back with ultrasound results.

## 2017-03-18 ENCOUNTER — Ambulatory Visit (INDEPENDENT_AMBULATORY_CARE_PROVIDER_SITE_OTHER): Payer: BLUE CROSS/BLUE SHIELD | Admitting: Endocrinology

## 2017-03-18 ENCOUNTER — Encounter: Payer: Self-pay | Admitting: Endocrinology

## 2017-03-18 ENCOUNTER — Other Ambulatory Visit (HOSPITAL_COMMUNITY)
Admission: RE | Admit: 2017-03-18 | Discharge: 2017-03-18 | Disposition: A | Discharge status: Home / Self Care | Payer: BLUE CROSS/BLUE SHIELD | Source: Ambulatory Visit | Attending: Endocrinology | Admitting: Endocrinology

## 2017-03-18 VITALS — BP 132/86 | HR 91 | Ht 66.0 in | Wt 202.0 lb

## 2017-03-18 DIAGNOSIS — E042 Nontoxic multinodular goiter: Secondary | ICD-10-CM | POA: Insufficient documentation

## 2017-03-18 DIAGNOSIS — E041 Nontoxic single thyroid nodule: Secondary | ICD-10-CM | POA: Diagnosis not present

## 2017-03-18 NOTE — Progress Notes (Signed)
   Subjective:    Patient ID: [name], female    DOB: October 03, 1960, 56 y.o.   MRN: [id number]  HPI Pt returns for thyroid bx    Review of Systems     Objective:   Physical Exam VS: see vs page GEN: no distress NECK: 3 cm right thyroid nodule is easily noted   Lab Results  Component Value Date   TSH 1.080 10/30/2016   T3TOTAL 126.2 06/07/2008   T4TOTAL 6.5 10/30/2016    thyroid needle bx: consent obtained, signed form on chart The area is first sprayed with cooling agent local: xylocaine 2%, with epinephrine prep: alcohol pad 4 bxs are done with 25 and 61Q needles no complications    Assessment & Plan:  Multinodular goiter.  bx is done  Patient Instructions  We'll let you know about the results. If as expected, no cancer is found, please come back for a follow-up appointment in 6 months.

## 2017-03-18 NOTE — Patient Instructions (Addendum)
We'll let you know about the results.  If as expected, no cancer is found, please come back for a follow-up appointment in 6 months.  

## 2017-03-21 ENCOUNTER — Ambulatory Visit (INDEPENDENT_AMBULATORY_CARE_PROVIDER_SITE_OTHER): Payer: BLUE CROSS/BLUE SHIELD

## 2017-03-21 ENCOUNTER — Ambulatory Visit (INDEPENDENT_AMBULATORY_CARE_PROVIDER_SITE_OTHER): Payer: BLUE CROSS/BLUE SHIELD | Admitting: Emergency Medicine

## 2017-03-21 ENCOUNTER — Encounter: Payer: Self-pay | Admitting: Emergency Medicine

## 2017-03-21 VITALS — BP 102/64 | HR 81 | Temp 98.1°F | Resp 16 | Ht 65.5 in | Wt 202.8 lb

## 2017-03-21 DIAGNOSIS — M25552 Pain in left hip: Secondary | ICD-10-CM

## 2017-03-21 DIAGNOSIS — M7072 Other bursitis of hip, left hip: Secondary | ICD-10-CM

## 2017-03-21 MED ORDER — PREDNISONE 20 MG PO TABS: 40.0000 mg | ORAL_TABLET | Freq: Every day | ORAL | 0 refills | Status: AC

## 2017-03-21 MED ORDER — DICLOFENAC SODIUM 75 MG PO TBEC: 75.0000 mg | DELAYED_RELEASE_TABLET | Freq: Two times a day (BID) | ORAL | 0 refills | Status: AC

## 2017-03-21 NOTE — Patient Instructions (Addendum)
pc    IF you received an x-ray today, you will receive an invoice from [facility]. Please contact [facility] at [phone] with questions or concerns regarding your invoice.   IF you received labwork today, you will receive an invoice from [facility]. Please contact [facility] at [phone] with questions or concerns regarding your invoice.   Our billing staff will not be able to assist you with questions regarding bills from these companies.  You will be contacted with the lab results as soon as they are available. The fastest way to get your results is to activate your My Chart account. Instructions are located on the last page of this paperwork. If you have not heard from [place] regarding the results in 2 weeks, please contact this office.     Hip Bursitis Hip bursitis is swelling of a fluid-filled sac (bursa) in your hip. This swelling (inflammation) can be painful. This condition may come and go over time. Follow these instructions at home: Medicines  Take over-the-counter and prescription medicines only as told by your doctor.  Do not drive or use heavy machinery while taking prescription pain medicine, or as told by your doctor.  If you were prescribed an antibiotic medicine, take it as told by your doctor. Do not stop taking the antibiotic even if you start to feel better. Activity  Return to your normal activities as told by your doctor. Ask your doctor what activities are safe for you.  Rest and protect your hip until you feel better. General instructions  Wear wraps that put pressure on your hip (compression wraps) only as told by your doctor.  Raise (elevate) your hip above the level of your heart as much as you can. To do this, try putting a pillow under your hips while you lie down. Stop if this causes pain.  Do not use your hip to support your body weight until your doctor says that you can.  Use crutches as told by your doctor.  Gently rub and  stretch your injured area as often as is comfortable.  Keep all follow-up visits as told by your doctor. This is important. How is this prevented?  Exercise regularly, as told by your doctor.  Warm up and stretch before being active.  Cool down and stretch after being active.  Avoid activities that bother your hip or cause pain.  Avoid sitting down for long periods at a time. Contact a doctor if:  You have a fever.  You get new symptoms.  You have trouble walking.  You have trouble doing everyday activities.  You have pain that gets worse.  You have pain that does not get better with medicine.  You get red skin on your hip area.  You get a feeling of warmth in your hip area. Get help right away if:  You cannot move your hip.  You have very bad pain. This information is not intended to replace advice given to you by your health care provider. Make sure you discuss any questions you have with your health care provider. Document Released: 10/02/2010 Document Revised: 02/05/2016 Document Reviewed: 04/01/2015 Elsevier Interactive Patient Education  [employer].

## 2017-03-21 NOTE — Progress Notes (Signed)
[name] 56 y.o.   Chief Complaint  Patient presents with  . Hip Pain    x 2weeks    HISTORY OF PRESENT ILLNESS: This is a 56 y.o. female complaining of left hip pain x 2 weeks; denies trauma or any other significant symptoms.  Hip Pain   The incident occurred more than 1 week ago. The incident occurred at home. There was no injury mechanism. The pain is present in the left hip. The quality of the pain is described as aching. The pain is at a severity of 5/10. The pain is moderate. The pain has been constant since onset. Associated symptoms include an inability to bear weight (painful). Pertinent negatives include no loss of sensation, muscle weakness, numbness or tingling. The symptoms are aggravated by movement.     Prior to Admission medications   Medication Sig Start Date End Date Taking? Authorizing Provider  cholecalciferol (VITAMIN D) 1000 UNITS tablet Take 2,000 Units by mouth every morning.    Yes [provider]  losartan (COZAAR) 25 MG tablet Take 25 mg by mouth daily.   Yes [provider]  metoprolol tartrate (LOPRESSOR) 25 MG tablet Take 25 mg by mouth 2 (two) times daily. 12/29/15  Yes [provider]  Omega-3 Fatty Acids (FISH OIL PO) Take 2 capsules by mouth every other day.    Yes [provider]  pantoprazole (PROTONIX) 40 MG tablet Take 40 mg by mouth daily as needed (acid reflux).    Yes [provider]  PARoxetine (PAXIL-CR) 25 MG 24 hr tablet Take 25 mg by mouth every morning.    Yes [provider]  potassium chloride SA (K-DUR,KLOR-CON) 20 MEQ tablet TAKE 1 TABLET(20 MEQ) BY MOUTH TWICE DAILY 12/31/16  Yes Panosh, Standley Brooking, MD  tiZANidine (ZANAFLEX) 2 MG tablet Take 2 mg by mouth daily as needed. 03/14/17  Yes [provider]  vitamin E 400 UNIT capsule Take 400 Units by mouth daily.   Yes [provider]    Allergies  Allergen Reactions  . Ceftriaxone Sodium Itching  . Doxycycline Rash     Patient Active Problem List   Diagnosis Date Noted  . LUQ abdominal pain 12/04/2015  . Belching 12/04/2015  . Abdominal discomfort 04/08/2015  . Hypercalcemia 07/11/2013  . Sweating 07/05/2013  . Other malaise and fatigue 07/05/2013  . Hypocalcemia 07/05/2013  . Palpitations 07/05/2013  . Low blood pressure reading 07/05/2013  . Pes planus (flat feet) 03/29/2013  . Numbness of toes 03/29/2013  . Toe pain 03/29/2013  . History of PSVT (paroxysmal supraventricular tachycardia) 06/24/2012  . Contact dermatitis 06/23/2012  . Hypokalemia 06/23/2012  . Intertrigo 06/23/2012  . Hx of cardiac catheterization   . Positive PPD   . Multinodular goiter 05/12/2012  . Sinus tachycardia 03/09/2012  . SLEEP APNEA 01/19/2008  . GERD 01/18/2008  . DYSPEPSIA 01/18/2008  . CONSTIPATION 01/18/2008  . IRRITABLE BOWEL SYNDROME 01/18/2008    Past Medical History:  Diagnosis Date  . Anemia   . Colon polyp 08/28/2012   Tubular adenoma  . Dense breast   . Genital warts   . GERD (gastroesophageal reflux disease)   . Hemorrhoids   . Hx of cardiac catheterization 2009   normal coronary arteries  . Hx of menorrhagia    Novasure  . Hypokalemia   . Hypomagnesemia   . Hypophosphatemia   . Lactose intolerance in adult   . Mitral valve prolapse   . Normal cardiac stress test 03/09/2012  no ischemia  . Positive PPD   . Rapid heart beat    States "irreg heart beat"  . Thyroid disease    Thyroid nodules    Past Surgical History:  Procedure Laterality Date  . CARDIAC CATHETERIZATION     x2 with normal results per pt  . NOVASURE ABLATION    . UPPER GASTROINTESTINAL ENDOSCOPY  May 2013    Social History   Social History  . Marital status: Divorced    Spouse name: N/A  . Number of children: 2  . Years of education: 16   Occupational History  . 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 Smoking status: Never Smoker  . Smokeless tobacco: Never Used  .  Alcohol use No  . Drug use: No  . Sexual activity: Yes   Other Topics Concern  . Not on file   Social History Narrative   Ms. 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 She lived in [place] and in [place] for a number of years her mom is from [place]. 16 years of education went to college at [employer] lives at home with her son who is in his 59s no pets   Neg ets tob etoh hx PA   6 hours of sleep   G2P2    TD2010  colonoscopy 2009   Now running  own business    HH of 2           Family History  Problem Relation Age of Onset  . Heart disease Father        died Mi 44  . Hypertension Maternal Grandfather   . Diabetes Maternal Grandfather   . Heart disease Maternal Grandfather   . Colon cancer Maternal Grandfather   . Diabetes Maternal Grandmother      Review of Systems  Constitutional: Negative.  Negative for chills and fever.  HENT: Negative.   Eyes: Negative.   Respiratory: Negative.   Cardiovascular: Negative for chest pain, palpitations and leg swelling.  Gastrointestinal: Negative.  Negative for abdominal pain, diarrhea, nausea and vomiting.  Genitourinary: Negative.  Negative for dysuria, flank pain and hematuria.  Musculoskeletal: Positive for back pain.  Skin: Negative.  Negative for rash.  Neurological: Negative.  Negative for dizziness, tingling, sensory change, focal weakness, numbness and headaches.  Endo/Heme/Allergies: Negative.   All other systems reviewed and are negative.  Vitals:   03/21/17 1450  BP: 102/64  Pulse: 81  Resp: 16  Temp: 98.1 F (36.7 C)     Physical Exam  Constitutional: She is oriented to person, place, and time. She appears well-developed and well-nourished.  HENT:  Head: Normocephalic and atraumatic.  Eyes: Conjunctivae and EOM are normal. Pupils are equal, round, and reactive to light.  Neck: Normal range of  motion. Neck supple.  Cardiovascular: Normal rate, regular rhythm and normal heart sounds.   Pulmonary/Chest: Effort normal and breath sounds normal.  Abdominal: Soft. Bowel sounds are normal. She exhibits no distension. There is no tenderness.  Musculoskeletal:       Lumbar back: Normal. She exhibits normal range of motion, no tenderness, no bony tenderness, no swelling and no spasm.  Left hip: painful ROM  Neurological: She is alert and oriented to person, place, and time. She displays normal reflexes. No sensory deficit. She exhibits normal muscle tone. Coordination normal.  Skin: Skin is warm  and dry. Capillary refill takes less than 2 seconds. No rash noted.  Psychiatric: She has a normal mood and affect. Her behavior is normal.  Vitals reviewed.  Dg Hip Unilat W Or W/o Pelvis 2-3 Views Left  Result Date: 03/21/2017 CLINICAL DATA:  Left hip joint pain EXAM: DG HIP (WITH OR WITHOUT PELVIS) 2-3V LEFT COMPARISON:  None. FINDINGS: Hip joints and SI joints are symmetric and unremarkable. No acute bony abnormality. Specifically, no fracture, subluxation, or dislocation. Soft tissues are intact. IMPRESSION: No acute bony abnormality. Electronically Signed   By: Rolm Baptise M.D.   On: 03/21/2017 15:19     ASSESSMENT & PLAN: Caterra was seen today for hip pain.  Diagnoses and all orders for this visit:  Pain of left hip joint -     DG HIP UNILAT W OR W/O PELVIS 2-3 VIEWS LEFT; Future  Bursitis of left hip, unspecified bursa  Other orders -     predniSONE (DELTASONE) 20 MG tablet; Take 2 tablets (40 mg total) by mouth daily with breakfast. -     diclofenac (VOLTAREN) 75 MG EC tablet; Take 1 tablet (75 mg total) by mouth 2 (two) times daily.    Patient Instructions   pc    IF you received an x-ray today, you will receive an invoice from [facility]. Please contact [facility] at [phone] with questions or concerns regarding your invoice.   IF you received  labwork today, you will receive an invoice from [place]. Please contact [facility] at [phone] with questions or concerns regarding your invoice.   Our billing staff will not be able to assist you with questions regarding bills from these companies.  You will be contacted with the lab results as soon as they are available. The fastest way to get your results is to activate your My Chart account. Instructions are located on the last page of this paperwork. If you have not heard from [place] regarding the results in 2 weeks, please contact this office.     Hip Bursitis Hip bursitis is swelling of a fluid-filled sac (bursa) in your hip. This swelling (inflammation) can be painful. This condition may come and go over time. Follow these instructions at home: Medicines  Take over-the-counter and prescription medicines only as told by your doctor.  Do not drive or use heavy machinery while taking prescription pain medicine, or as told by your doctor.  If you were prescribed an antibiotic medicine, take it as told by your doctor. Do not stop taking the antibiotic even if you start to feel better. Activity  Return to your normal activities as told by your doctor. Ask your doctor what activities are safe for you.  Rest and protect your hip until you feel better. General instructions  Wear wraps that put pressure on your hip (compression wraps) only as told by your doctor.  Raise (elevate) your hip above the level of your heart as much as you can. To do this, try putting a pillow under your hips while you lie down. Stop if this causes pain.  Do not use your hip to support your body weight until your doctor says that you can.  Use crutches as told by your doctor.  Gently rub and stretch your injured area as often as is comfortable.  Keep all follow-up visits as told by your doctor. This is important. How is this prevented?  Exercise regularly, as told by your doctor.  Warm up and stretch  before being active.  Cool  down and stretch after being active.  Avoid activities that bother your hip or cause pain.  Avoid sitting down for long periods at a time. Contact a doctor if:  You have a fever.  You get new symptoms.  You have trouble walking.  You have trouble doing everyday activities.  You have pain that gets worse.  You have pain that does not get better with medicine.  You get red skin on your hip area.  You get a feeling of warmth in your hip area. Get help right away if:  You cannot move your hip.  You have very bad pain. This information is not intended to replace advice given to you by your health care provider. Make sure you discuss any questions you have with your health care provider. Document Released: 10/02/2010 Document Revised: 02/05/2016 Document Reviewed: 04/01/2015 Elsevier Interactive Patient Education  2018 [employer].      Agustina Caroli, MD [facility]

## 2017-03-26 ENCOUNTER — Ambulatory Visit: Payer: BLUE CROSS/BLUE SHIELD | Admitting: Physician Assistant

## 2017-03-26 ENCOUNTER — Emergency Department (HOSPITAL_COMMUNITY)
Admission: EM | Admit: 2017-03-26 | Discharge: 2017-03-26 | Disposition: A | Discharge status: Home / Self Care | Payer: BLUE CROSS/BLUE SHIELD | Attending: Emergency Medicine | Admitting: Emergency Medicine

## 2017-03-26 ENCOUNTER — Encounter (HOSPITAL_COMMUNITY): Payer: Self-pay | Admitting: Emergency Medicine

## 2017-03-26 DIAGNOSIS — L989 Disorder of the skin and subcutaneous tissue, unspecified: Secondary | ICD-10-CM | POA: Diagnosis not present

## 2017-03-26 DIAGNOSIS — L299 Pruritus, unspecified: Secondary | ICD-10-CM

## 2017-03-26 DIAGNOSIS — Z79899 Other long term (current) drug therapy: Secondary | ICD-10-CM | POA: Insufficient documentation

## 2017-03-26 DIAGNOSIS — R21 Rash and other nonspecific skin eruption: Secondary | ICD-10-CM | POA: Diagnosis not present

## 2017-03-26 MED ORDER — TRIAMCINOLONE ACETONIDE 0.1 % EX CREA: 1.0000 "application " | TOPICAL_CREAM | Freq: Two times a day (BID) | CUTANEOUS | 0 refills | Status: DC

## 2017-03-26 NOTE — ED Triage Notes (Signed)
Pt reports she was bit by an insect a week ago on her L foot. Has had itching there ever since. Now has itching higher on her leg and hip pain. Pt ambulatory.

## 2017-03-26 NOTE — ED Notes (Signed)
Bed: WTR6 Expected date:  Expected time:  Means of arrival:  Comments: 

## 2017-03-26 NOTE — ED Provider Notes (Signed)
[facility] DEPT Provider Note   CSN: [id number] Arrival date & time: 03/26/17  9629  By signing my name below, I, Ephriam Jenkins, attest that this documentation has been prepared under the direction and in the presence of [employer].  Electronically Signed: Ephriam Jenkins, ED Scribe. 03/26/17. 10:40 AM.  History   Chief Complaint Chief Complaint  Patient presents with  . Insect Bite    HPI HPI Comments: [name] is a 56 y.o. female who presents to the Emergency Department in concerns for a possible bug bite to her left foot that started approximately 2 weeks ago. 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 Pt felt like something but her on the medial aspect of her right foot but she did not see any insect and is unsure of what may have bit her. 2 weeks ago she developed a rash/possible bite on her right medial foot in a different place. She denies any weeping from the areas but notes that they have been persistently pruritic over the past 2 weeks. Pt has taken Benadryl with no relief. Since pt's symptoms started she states that she has been itching higher up her leg. No new soaps or detergents. She is able to ambulate without difficulty.   The history is provided by the patient. No language interpreter was used.    Past Medical History:  Diagnosis Date  . Anemia   . Colon polyp 08/28/2012   Tubular adenoma  . Dense breast   . Genital warts   . GERD (gastroesophageal reflux disease)   . Hemorrhoids   . Hx of cardiac catheterization 2009   normal coronary arteries  . Hx of menorrhagia    Novasure  . Hypokalemia   . Hypomagnesemia   . Hypophosphatemia   . Lactose intolerance in adult   . Mitral valve prolapse   . Normal cardiac stress test 03/09/2012   no ischemia  . Positive PPD   . Rapid heart beat    States "irreg heart beat"  . Thyroid disease    Thyroid nodules    Patient Active Problem List   Diagnosis Date Noted  . LUQ  abdominal pain 12/04/2015  . Belching 12/04/2015  . Abdominal discomfort 04/08/2015  . Hypercalcemia 07/11/2013  . Sweating 07/05/2013  . Other malaise and fatigue 07/05/2013  . Hypocalcemia 07/05/2013  . Palpitations 07/05/2013  . Low blood pressure reading 07/05/2013  . Pes planus (flat feet) 03/29/2013  . Numbness of toes 03/29/2013  . Toe pain 03/29/2013  . History of PSVT (paroxysmal supraventricular tachycardia) 06/24/2012  . Contact dermatitis 06/23/2012  . Hypokalemia 06/23/2012  . Intertrigo 06/23/2012  . Hx of cardiac catheterization   . Positive PPD   . Multinodular goiter 05/12/2012  . Sinus tachycardia 03/09/2012  . SLEEP APNEA 01/19/2008  . GERD 01/18/2008  . DYSPEPSIA 01/18/2008  . CONSTIPATION 01/18/2008  . IRRITABLE BOWEL SYNDROME 01/18/2008    Past Surgical History:  Procedure Laterality Date  . CARDIAC CATHETERIZATION     x2 with normal results per pt  . NOVASURE ABLATION    . UPPER GASTROINTESTINAL ENDOSCOPY  May 2013    OB History    No data available       Home Medications    Prior to Admission medications   Medication Sig Start Date End Date Taking? Authorizing Provider  cholecalciferol (VITAMIN D) 1000 UNITS tablet Take 2,000 Units by mouth every morning.     [provider]  diclofenac (VOLTAREN)  75 MG EC tablet Take 1 tablet (75 mg total) by mouth 2 (two) times daily. 03/21/17 03/26/17  Horald Pollen, MD  losartan (COZAAR) 25 MG tablet Take 25 mg by mouth daily.    [provider]  metoprolol tartrate (LOPRESSOR) 25 MG tablet Take 25 mg by mouth 2 (two) times daily. 12/29/15   [provider]  Omega-3 Fatty Acids (FISH OIL PO) Take 2 capsules by mouth every other day.     [provider]  pantoprazole (PROTONIX) 40 MG tablet Take 40 mg by mouth daily as needed (acid reflux).     [provider]  PARoxetine (PAXIL-CR) 25 MG 24 hr tablet Take 25 mg by mouth every morning.     [provider]  potassium chloride SA (K-DUR,KLOR-CON) 20 MEQ tablet TAKE 1 TABLET(20 MEQ) BY MOUTH TWICE DAILY 12/31/16   Panosh, Standley Brooking, MD  predniSONE (DELTASONE) 20 MG tablet Take 2 tablets (40 mg total) by mouth daily with breakfast. 03/21/17 03/26/17  Horald Pollen, MD  tiZANidine (ZANAFLEX) 2 MG tablet Take 2 mg by mouth daily as needed. 03/14/17   [provider]  vitamin E 400 UNIT capsule Take 400 Units by mouth daily.    [provider]    Family History Family History  Problem Relation Age of Onset  . Heart disease Father        died Mi 4  . Hypertension Maternal Grandfather   . Diabetes Maternal Grandfather   . Heart disease Maternal Grandfather   . Colon cancer Maternal Grandfather   . Diabetes Maternal Grandmother     Social History Social History  Substance Use Topics  . Smoking status: Never Smoker  . Smokeless tobacco: Never Used  . Alcohol use No     Allergies   Ceftriaxone sodium and Doxycycline   Review of Systems Review of Systems  Musculoskeletal: Negative for gait problem.  Skin: Positive for rash.       Small lesions to left foot  Allergic/Immunologic: Negative for immunocompromised state.     Physical Exam Updated Vital Signs BP 123/69 (BP Location: Left Arm)   Pulse 71   Temp 98.1 F (36.7 C) (Oral)   Resp 16   Ht 5\' 6"  (1.676 m)   Wt 200 lb (90.7 kg)   LMP 09/18/2016 (Exact Date)   SpO2 100%   BMI 32.28 kg/m   Physical Exam  Constitutional: She is oriented to person, place, and time. She appears well-developed and well-nourished. No distress.  HENT:  Head: Normocephalic and atraumatic.  Eyes: Pupils are equal, round, and reactive to light. Conjunctivae are normal. Right eye exhibits no discharge. Left eye exhibits no discharge. No scleral icterus.  Neck: Normal range of motion.  Cardiovascular: Normal rate.   Pulmonary/Chest: Effort normal. No respiratory distress.  Abdominal: She exhibits no distension.    Neurological: She is alert and oriented to person, place, and time.  Skin: Skin is warm and dry. She is not diaphoretic.  Small erythematous papules in a linear distribution over medial aspect of right foot  Psychiatric: She has a normal mood and affect. Her behavior is normal. Judgment normal.  Nursing note and vitals reviewed.    ED Treatments / Results  DIAGNOSTIC STUDIES: Oxygen Saturation is 100% on RA, normal by my interpretation.  COORDINATION OF CARE: 10:35 AM-Discussed treatment plan with pt at bedside and pt agreed to plan.   Labs (all labs ordered are listed, but only abnormal results are displayed)  Labs Reviewed - No data to display  EKG  EKG Interpretation None       Radiology No results found.  Procedures Procedures (including critical care time)  Medications Ordered in ED Medications - No data to display   Initial Impression / Assessment and Plan / ED Course  I have reviewed the triage vital signs and the nursing notes.  Pertinent labs & imaging results that were available during my care of the patient were reviewed by me and considered in my medical decision making (see chart for details).  56 year old female presents with possible bug bite/rash to right foot. She has had no relief with benadryl. Will rx steroid cream and advised follow up with PCP.  Final Clinical Impressions(s) / ED Diagnoses   Final diagnoses:  Itching  Rash    New Prescriptions New Prescriptions   No medications on file   I personally performed the services described in this documentation, which was scribed in my presence. The recorded information has been reviewed and is accurate.     Recardo Evangelist, PA-C 03/26/17 1657    Charlesetta Shanks, MD 03/31/17 1700

## 2017-03-26 NOTE — Discharge Instructions (Signed)
Use steroid cream twice a day for one week or until rash goes away Cover with bandage so medication doesn't wear off

## 2017-04-04 DIAGNOSIS — H04123 Dry eye syndrome of bilateral lacrimal glands: Secondary | ICD-10-CM | POA: Diagnosis not present

## 2017-04-04 DIAGNOSIS — H2513 Age-related nuclear cataract, bilateral: Secondary | ICD-10-CM | POA: Diagnosis not present

## 2017-04-27 DIAGNOSIS — F411 Generalized anxiety disorder: Secondary | ICD-10-CM | POA: Diagnosis not present

## 2017-04-27 DIAGNOSIS — F4310 Post-traumatic stress disorder, unspecified: Secondary | ICD-10-CM | POA: Diagnosis not present

## 2017-04-27 DIAGNOSIS — F331 Major depressive disorder, recurrent, moderate: Secondary | ICD-10-CM | POA: Diagnosis not present

## 2017-05-04 ENCOUNTER — Encounter: Payer: Self-pay | Admitting: Emergency Medicine

## 2017-05-04 ENCOUNTER — Ambulatory Visit: Payer: BLUE CROSS/BLUE SHIELD | Admitting: Internal Medicine

## 2017-05-04 ENCOUNTER — Ambulatory Visit (INDEPENDENT_AMBULATORY_CARE_PROVIDER_SITE_OTHER): Payer: BLUE CROSS/BLUE SHIELD | Admitting: Emergency Medicine

## 2017-05-04 VITALS — BP 113/71 | HR 72 | Temp 98.1°F | Resp 16 | Ht 65.5 in | Wt 203.2 lb

## 2017-05-04 DIAGNOSIS — R1084 Generalized abdominal pain: Secondary | ICD-10-CM

## 2017-05-04 DIAGNOSIS — R197 Diarrhea, unspecified: Secondary | ICD-10-CM

## 2017-05-04 DIAGNOSIS — Z8719 Personal history of other diseases of the digestive system: Secondary | ICD-10-CM | POA: Diagnosis not present

## 2017-05-04 LAB — POCT CBC
Granulocyte percent: 69.4 %G (ref 37–80)
HEMATOCRIT: 40.1 % (ref 37.7–47.9)
Hemoglobin: 12.5 g/dL (ref 12.2–16.2)
LYMPH, POC: 1.6 (ref 0.6–3.4)
MCH, POC: 23.5 pg — AB (ref 27–31.2)
MCHC: 31.2 g/dL — AB (ref 31.8–35.4)
MCV: 75.4 fL — AB (ref 80–97)
MID (cbc): 0.4 (ref 0–0.9)
MPV: 7.4 fL (ref 0–99.8)
POC GRANULOCYTE: 4.7 (ref 2–6.9)
POC LYMPH %: 24 % (ref 10–50)
POC MID %: 6.6 %M (ref 0–12)
Platelet Count, POC: 219 10*3/uL (ref 142–424)
RBC: 5.32 M/uL (ref 4.04–5.48)
RDW, POC: 14.6 %
WBC: 6.8 10*3/uL (ref 4.6–10.2)

## 2017-05-04 MED ORDER — CIPROFLOXACIN HCL 500 MG PO TABS: 500.0000 mg | ORAL_TABLET | Freq: Two times a day (BID) | ORAL | 0 refills | Status: AC

## 2017-05-04 NOTE — Patient Instructions (Addendum)
     IF you received an x-ray today, you will receive an invoice from [facility]. Please contact [facility] at [phone] with questions or concerns regarding your invoice.   IF you received labwork today, you will receive an invoice from [facility]. Please contact [facility] at [phone] with questions or concerns regarding your invoice.   Our billing staff will not be able to assist you with questions regarding bills from these companies.  You will be contacted with the lab results as soon as they are available. The fastest way to get your results is to activate your My Chart account. Instructions are located on the last page of this paperwork. If you have not heard from us regarding the results in 2 weeks, please contact this office.     Abdominal Pain, Adult Many things can cause belly (abdominal) pain. Most times, belly pain is not dangerous. Many cases of belly pain can be watched and treated at home. Sometimes belly pain is serious, though. Your doctor will try to find the cause of your belly pain. Follow these instructions at home:  Take over-the-counter and prescription medicines only as told by your doctor. Do not take medicines that help you poop (laxatives) unless told to by your doctor.  Drink enough fluid to keep your pee (urine) clear or pale yellow.  Watch your belly pain for any changes.  Keep all follow-up visits as told by your doctor. This is important. Contact a doctor if:  Your belly pain changes or gets worse.  You are not hungry, or you lose weight without trying.  You are having trouble pooping (constipated) or have watery poop (diarrhea) for more than 2-3 days.  You have pain when you pee or poop.  Your belly pain wakes you up at night.  Your pain gets worse with meals, after eating, or with certain foods.  You are throwing up and cannot keep anything down.  You have a fever. Get help right away if:  Your pain does not go away  as soon as your doctor says it should.  You cannot stop throwing up.  Your pain is only in areas of your belly, such as the right side or the left lower part of the belly.  You have bloody or black poop, or poop that looks like tar.  You have very bad pain, cramping, or bloating in your belly.  You have signs of not having enough fluid or water in your body (dehydration), such as:  Dark pee, very little pee, or no pee.  Cracked lips.  Dry mouth.  Sunken eyes.  Sleepiness.  Weakness. This information is not intended to replace advice given to you by your health care provider. Make sure you discuss any questions you have with your health care provider. Document Released: 02/16/2008 Document Revised: 03/19/2016 Document Reviewed: 02/11/2016 Elsevier Interactive Patient Education  2017 Elsevier Inc.  

## 2017-05-04 NOTE — Progress Notes (Signed)
[name] 56 y.o.   Chief Complaint  Patient presents with  . Abdominal Pain    right side since Saturday, dull pain , 6/10 pain level with coughing,breathing or laughing.  + burping, bloating and gas, negative for nausea/emesisi.  Has seen blood intermittently for the past few weeks, saw blood on Sunday in stool.    HISTORY OF PRESENT ILLNESS: This is a 56 y.o. female complaining of right sided abdominal pain with bloating, nausea, non-bloody diarrhea x 4 days; better today; foul smelling feces with tinges of blood. Has h/o diverticulosis. Denies vomiting, fever, or chills.  Abdominal Pain  This is a new problem. The current episode started in the past 7 days. The onset quality is gradual. The problem occurs intermittently. The problem has been waxing and waning. The pain is located in the generalized abdominal region. The pain is at a severity of 3/10. The pain is mild. The quality of the pain is colicky. The abdominal pain does not radiate. Associated symptoms include belching, diarrhea, flatus, hematochezia and nausea. Pertinent negatives include no dysuria, fever, frequency, headaches, hematuria, melena, myalgias or vomiting. The pain is aggravated by eating. The pain is relieved by nothing. Treatments tried: diet change. There is no history of abdominal surgery, colon cancer, Crohn's disease, gallstones, irritable bowel syndrome, pancreatitis, PUD or ulcerative colitis.     Prior to Admission medications   Medication Sig Start Date End Date Taking? Authorizing Provider  cholecalciferol (VITAMIN D) 1000 UNITS tablet Take 2,000 Units by mouth every morning.    Yes [provider]  losartan (COZAAR) 25 MG tablet Take 25 mg by mouth daily.   Yes [provider]  metoprolol tartrate (LOPRESSOR) 25 MG tablet Take 25 mg by mouth 2 (two) times daily. 12/29/15  Yes [provider]  Omega-3 Fatty Acids (FISH OIL PO) Take 2 capsules by mouth every other day.    Yes  [provider]  PARoxetine (PAXIL-CR) 25 MG 24 hr tablet Take 25 mg by mouth every morning.    Yes [provider]  potassium chloride SA (K-DUR,KLOR-CON) 20 MEQ tablet TAKE 1 TABLET(20 MEQ) BY MOUTH TWICE DAILY 12/31/16  Yes Panosh, Standley Brooking, MD  vitamin E 400 UNIT capsule Take 400 Units by mouth daily.   Yes [provider]  pantoprazole (PROTONIX) 40 MG tablet Take 40 mg by mouth daily as needed (acid reflux).     [provider]  tiZANidine (ZANAFLEX) 2 MG tablet Take 2 mg by mouth daily as needed. 03/14/17   [provider]  triamcinolone cream (KENALOG) 0.1 % Apply 1 application topically 2 (two) times daily. Patient not taking: Reported on 05/04/2017 03/26/17   Recardo Evangelist, PA-C    Allergies  Allergen Reactions  . Ceftriaxone Sodium Itching  . Doxycycline Rash    Patient Active Problem List   Diagnosis Date Noted  . LUQ abdominal pain 12/04/2015  . Belching 12/04/2015  . Abdominal discomfort 04/08/2015  . Hypercalcemia 07/11/2013  . Sweating 07/05/2013  . Other malaise and fatigue 07/05/2013  . Hypocalcemia 07/05/2013  . Palpitations 07/05/2013  . Low blood pressure reading 07/05/2013  . Pes planus (flat feet) 03/29/2013  . Numbness of toes 03/29/2013  . Toe pain 03/29/2013  . History of PSVT (paroxysmal supraventricular tachycardia) 06/24/2012  . Contact dermatitis 06/23/2012  . Hypokalemia 06/23/2012  . Intertrigo 06/23/2012  . Hx of cardiac catheterization   . Positive PPD   . Multinodular goiter 05/12/2012  . Sinus tachycardia 03/09/2012  .  SLEEP APNEA 01/19/2008  . GERD 01/18/2008  . DYSPEPSIA 01/18/2008  . CONSTIPATION 01/18/2008  . IRRITABLE BOWEL SYNDROME 01/18/2008    Past Medical History:  Diagnosis Date  . Anemia   . Colon polyp 08/28/2012   Tubular adenoma  . Dense breast   . Genital warts   . GERD (gastroesophageal reflux disease)   . Hemorrhoids   . Hx of cardiac catheterization 2009   normal  coronary arteries  . Hx of menorrhagia    Novasure  . Hypokalemia   . Hypomagnesemia   . Hypophosphatemia   . Lactose intolerance in adult   . Mitral valve prolapse   . Normal cardiac stress test 03/09/2012   no ischemia  . Positive PPD   . Rapid heart beat    States "irreg heart beat"  . Thyroid disease    Thyroid nodules    Past Surgical History:  Procedure Laterality Date  . CARDIAC CATHETERIZATION     x2 with normal results per pt  . NOVASURE ABLATION    . UPPER GASTROINTESTINAL ENDOSCOPY  May 2013    Social History   Social History  . Marital status: Divorced    Spouse name: N/A  . Number of children: 2  . Years of education: 16   Occupational History  . dialysis tech [employer]   Social History Main Topics  . Smoking status: Never Smoker  . Smokeless tobacco: Never Used  . Alcohol use No  . Drug use: No  . Sexual activity: Yes   Other Topics Concern  . Not on file   Social History Narrative   Ms. Ivins is a divorced [place] American female who works as a Engineer, 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 She lived in [place] and in [place] for a number of years her mom is from [place]. 16 years of education went to college at [employer] lives at home with her son who is in his 9s no pets   Neg ets tob etoh hx PA   6 hours of sleep   G2P2    TD2010  colonoscopy 2009   Now running  own business    HH of 2           Family History  Problem Relation Age of Onset  . Heart disease Father        died Mi 52  . Hypertension Maternal Grandfather   . Diabetes Maternal Grandfather   . Heart disease Maternal Grandfather   . Colon cancer Maternal Grandfather   . Diabetes Maternal Grandmother      Review of Systems  Constitutional: Negative.  Negative for chills and fever.  HENT: Negative.   Eyes: Negative.   Respiratory: Negative.  Negative for cough and shortness of breath.     Cardiovascular: Negative.  Negative for chest pain and palpitations.  Gastrointestinal: Positive for abdominal pain, blood in stool, diarrhea, flatus, hematochezia and nausea. Negative for heartburn, melena and vomiting.  Genitourinary: Negative for dysuria, frequency and hematuria.  Musculoskeletal: Negative.  Negative for back pain, myalgias and neck pain.  Skin: Negative.  Negative for rash.  Neurological: Negative.  Negative for dizziness and headaches.  Endo/Heme/Allergies: Negative.   All other systems reviewed and are negative.    Physical Exam  Constitutional: She is oriented to person, place, and time. She appears well-developed and well-nourished.  HENT:  Head: Normocephalic and atraumatic.  Eyes: Pupils are equal, round, and reactive to  light. Conjunctivae and EOM are normal.  Neck: Normal range of motion. Neck supple. No JVD present. No thyromegaly present.  Cardiovascular: Normal rate, regular rhythm, normal heart sounds and intact distal pulses.   Pulmonary/Chest: Effort normal and breath sounds normal.  Abdominal: Soft. Bowel sounds are normal. She exhibits no distension and no mass. There is no tenderness. There is no rebound and no guarding.  Musculoskeletal: Normal range of motion.  Lymphadenopathy:    She has no cervical adenopathy.  Neurological: She is alert and oriented to person, place, and time. No sensory deficit. She exhibits normal muscle tone.  Skin: Skin is warm and dry. Capillary refill takes less than 2 seconds. No rash noted.  Psychiatric: She has a normal mood and affect. Her behavior is normal.  Vitals reviewed.   Results for orders placed or performed in visit on 05/04/17 (from the past 24 hour(s))  POCT CBC     Status: Abnormal   Collection Time: 05/04/17  3:53 PM  Result Value Ref Range   WBC 6.8 4.6 - 10.2 K/uL   Lymph, poc 1.6 0.6 - 3.4   POC LYMPH PERCENT 24.0 10 - 50 %L   MID (cbc) 0.4 0 - 0.9   POC MID % 6.6 0 - 12 %M   POC Granulocyte  4.7 2 - 6.9   Granulocyte percent 69.4 37 - 80 %G   RBC 5.32 4.04 - 5.48 M/uL   Hemoglobin 12.5 12.2 - 16.2 g/dL   HCT, POC 40.1 37.7 - 47.9 %   MCV 75.4 (A) 80 - 97 fL   MCH, POC 23.5 (A) 27 - 31.2 pg   MCHC 31.2 (A) 31.8 - 35.4 g/dL   RDW, POC 14.6 %   Platelet Count, POC 219 142 - 424 K/uL   MPV 7.4 0 - 99.8 fL    ASSESSMENT & PLAN:  Lore was seen today for abdominal pain.  Diagnoses and all orders for this visit:  Generalized abdominal pain -     POCT CBC -     Comprehensive metabolic panel -     Lipase -     US Abdomen Complete; Future  History of diverticulosis  Diarrhea of presumed infectious origin  Other orders -     ciprofloxacin (CIPRO) 500 MG tablet; Take 1 tablet (500 mg total) by mouth 2 (two) times daily.    Patient Instructions       IF you received an x-ray today, you will receive an invoice from [facility]. Please contact [facility] at [phone] with questions or concerns regarding your invoice.   IF you received labwork today, you will receive an invoice from [facility]. Please contact [facility] at [phone] with questions or concerns regarding your invoice.   Our billing staff will not be able to assist you with questions regarding bills from these companies.  You will be contacted with the lab results as soon as they are available. The fastest way to get your results is to activate your My Chart account. Instructions are located on the last page of this paperwork. If you have not heard from [place] regarding the results in 2 weeks, please contact this office.     Abdominal Pain, Adult Many things can cause belly (abdominal) pain. Most times, belly pain is not dangerous. Many cases of belly pain can be watched and treated at home. Sometimes belly pain is serious, though. Your doctor will try to find the cause of your belly pain. Follow these instructions at home:  Take over-the-counter and prescription medicines only as  told by your doctor. Do not take medicines that help you poop (laxatives) unless told to by your doctor.  Drink enough fluid to keep your pee (urine) clear or pale yellow.  Watch your belly pain for any changes.  Keep all follow-up visits as told by your doctor. This is important. Contact a doctor if:  Your belly pain changes or gets worse.  You are not hungry, or you lose weight without trying.  You are having trouble pooping (constipated) or have watery poop (diarrhea) for more than 2-3 days.  You have pain when you pee or poop.  Your belly pain wakes you up at night.  Your pain gets worse with meals, after eating, or with certain foods.  You are throwing up and cannot keep anything down.  You have a fever. Get help right away if:  Your pain does not go away as soon as your doctor says it should.  You cannot stop throwing up.  Your pain is only in areas of your belly, such as the right side or the left lower part of the belly.  You have bloody or black poop, or poop that looks like tar.  You have very bad pain, cramping, or bloating in your belly.  You have signs of not having enough fluid or water in your body (dehydration), such as: ? Dark pee, very little pee, or no pee. ? Cracked lips. ? Dry mouth. ? Sunken eyes. ? Sleepiness. ? Weakness. This information is not intended to replace advice given to you by your health care provider. Make sure you discuss any questions you have with your health care provider. Document Released: 02/16/2008 Document Revised: 03/19/2016 Document Reviewed: 02/11/2016 Elsevier Interactive Patient Education  2017 Elsevier Inc.     Agustina Caroli, MD [facility]

## 2017-05-05 LAB — COMPREHENSIVE METABOLIC PANEL
ALK PHOS: 72 IU/L (ref 39–117)
ALT: 23 IU/L (ref 0–32)
AST: 24 IU/L (ref 0–40)
Albumin/Globulin Ratio: 1.8 (ref 1.2–2.2)
Albumin: 4.1 g/dL (ref 3.5–5.5)
BILIRUBIN TOTAL: 0.3 mg/dL (ref 0.0–1.2)
BUN/Creatinine Ratio: 15 (ref 9–23)
BUN: 11 mg/dL (ref 6–24)
CHLORIDE: 102 mmol/L (ref 96–106)
CO2: 24 mmol/L (ref 20–29)
Calcium: 9 mg/dL (ref 8.7–10.2)
Creatinine, Ser: 0.73 mg/dL (ref 0.57–1.00)
GFR calc Af Amer: 106 mL/min/{1.73_m2} (ref >59)
GFR calc non Af Amer: 92 mL/min/{1.73_m2} (ref >59)
GLUCOSE: 89 mg/dL (ref 65–99)
Globulin, Total: 2.3 g/dL (ref 1.5–4.5)
Potassium: 4.2 mmol/L (ref 3.5–5.2)
Sodium: 140 mmol/L (ref 134–144)
TOTAL PROTEIN: 6.4 g/dL (ref 6.0–8.5)

## 2017-05-05 LAB — LIPASE: LIPASE: 25 U/L (ref 14–72)

## 2017-05-19 ENCOUNTER — Ambulatory Visit (HOSPITAL_COMMUNITY): Admission: RE | Admit: 2017-05-19 | Payer: BLUE CROSS/BLUE SHIELD | Source: Ambulatory Visit

## 2017-05-25 ENCOUNTER — Ambulatory Visit (INDEPENDENT_AMBULATORY_CARE_PROVIDER_SITE_OTHER): Payer: BLUE CROSS/BLUE SHIELD | Admitting: Emergency Medicine

## 2017-05-25 ENCOUNTER — Encounter: Payer: Self-pay | Admitting: Emergency Medicine

## 2017-05-25 VITALS — BP 126/72 | HR 68 | Temp 98.1°F | Resp 17 | Ht 66.5 in | Wt 205.0 lb

## 2017-05-25 DIAGNOSIS — K115 Sialolithiasis: Secondary | ICD-10-CM

## 2017-05-25 MED ORDER — PREDNISONE 20 MG PO TABS: 20.0000 mg | ORAL_TABLET | Freq: Every day | ORAL | 0 refills | Status: AC

## 2017-05-25 NOTE — Progress Notes (Signed)
Freeman Caldron 56 y.o.   Chief Complaint  Patient presents with  . Facial Swelling    HISTORY OF PRESENT ILLNESS: This is a 56 y.o. female complaining of episode last night of acute onset of left sided submandibular swelling while eating; went away after about 30 minutes. Thought it was perhaps an allergic reaction and took Benadryl. Had no hives or any other associated symptomatology.  HPI   Prior to Admission medications   Medication Sig Start Date End Date Taking? Authorizing Provider  cholecalciferol (VITAMIN D) 1000 UNITS tablet Take 2,000 Units by mouth every morning.    Yes [provider]  losartan (COZAAR) 25 MG tablet Take 25 mg by mouth daily.   Yes [provider]  metoprolol tartrate (LOPRESSOR) 25 MG tablet Take 25 mg by mouth 2 (two) times daily. 12/29/15  Yes [provider]  Omega-3 Fatty Acids (FISH OIL PO) Take 2 capsules by mouth every other day.    Yes [provider]  pantoprazole (PROTONIX) 40 MG tablet Take 40 mg by mouth daily as needed (acid reflux).    Yes [provider]  PARoxetine (PAXIL-CR) 25 MG 24 hr tablet Take 25 mg by mouth every morning.    Yes [provider]  potassium chloride SA (K-DUR,KLOR-CON) 20 MEQ tablet TAKE 1 TABLET(20 MEQ) BY MOUTH TWICE DAILY 12/31/16  Yes Panosh, Standley Brooking, MD  vitamin E 400 UNIT capsule Take 400 Units by mouth daily.   Yes [provider]  predniSONE (DELTASONE) 20 MG tablet Take 1 tablet (20 mg total) by mouth daily with breakfast. 05/25/17 05/30/17  Horald Pollen, MD    Allergies  Allergen Reactions  . Ceftriaxone Sodium Itching  . Doxycycline Rash    Patient Active Problem List   Diagnosis Date Noted  . Generalized abdominal pain 05/04/2017  . History of diverticulosis 05/04/2017  . Diarrhea of presumed infectious origin 05/04/2017  . LUQ abdominal pain 12/04/2015  . Belching 12/04/2015  . Abdominal discomfort 04/08/2015  . Hypercalcemia  07/11/2013  . Sweating 07/05/2013  . Other malaise and fatigue 07/05/2013  . Hypocalcemia 07/05/2013  . Palpitations 07/05/2013  . Low blood pressure reading 07/05/2013  . Pes planus (flat feet) 03/29/2013  . Numbness of toes 03/29/2013  . Toe pain 03/29/2013  . History of PSVT (paroxysmal supraventricular tachycardia) 06/24/2012  . Contact dermatitis 06/23/2012  . Hypokalemia 06/23/2012  . Intertrigo 06/23/2012  . Hx of cardiac catheterization   . Positive PPD   . Multinodular goiter 05/12/2012  . Sinus tachycardia 03/09/2012  . SLEEP APNEA 01/19/2008  . GERD 01/18/2008  . DYSPEPSIA 01/18/2008  . CONSTIPATION 01/18/2008  . IRRITABLE BOWEL SYNDROME 01/18/2008    Past Medical History:  Diagnosis Date  . Anemia   . Colon polyp 08/28/2012   Tubular adenoma  . Dense breast   . Genital warts   . GERD (gastroesophageal reflux disease)   . Hemorrhoids   . Hx of cardiac catheterization 2009   normal coronary arteries  . Hx of menorrhagia    Novasure  . Hypokalemia   . Hypomagnesemia   . Hypophosphatemia   . Lactose intolerance in adult   . Mitral valve prolapse   . Normal cardiac stress test 03/09/2012   no ischemia  . Positive PPD   . Rapid heart beat    States "irreg heart beat"  . Thyroid disease    Thyroid nodules    Past Surgical History:  Procedure Laterality Date  . CARDIAC CATHETERIZATION  x2 with normal results per pt  . NOVASURE ABLATION    . UPPER GASTROINTESTINAL ENDOSCOPY  May 2013    Social History   Social History  . Marital status: Divorced    Spouse name: N/A  . Number of children: 2  . Years of education: 16   Occupational History  . dialysis tech [employer]   Social History Main Topics  . Smoking status: Never Smoker  . Smokeless tobacco: Never Used  . Alcohol use No  . Drug use: No  . Sexual activity: Yes   Other Topics Concern  . Not on file   Social History Narrative   Ms. Roldan is a divorced [place]  American female who works as a Engineer, 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 She lived in [place] and in [place] for a number of years her mom is from [place]. 16 years of education went to college at [employer] lives at home with her son who is in his 56s no pets   Neg ets tob etoh hx PA   6 hours of sleep   G2P2    TD2010  colonoscopy 2009   Now running  own business    HH of 2           Family History  Problem Relation Age of Onset  . Heart disease Father        died Mi 52  . Hypertension Maternal Grandfather   . Diabetes Maternal Grandfather   . Heart disease Maternal Grandfather   . Colon cancer Maternal Grandfather   . Diabetes Maternal Grandmother      Review of Systems  Constitutional: Negative.  Negative for chills and fever.  HENT: Negative.  Negative for sore throat.   Eyes: Negative.  Negative for discharge and redness.  Respiratory: Negative.  Negative for cough and shortness of breath.   Cardiovascular: Negative.  Negative for chest pain and palpitations.  Gastrointestinal: Negative for abdominal pain, diarrhea, nausea and vomiting.  Genitourinary: Negative.  Negative for dysuria and hematuria.  Skin: Negative.  Negative for itching and rash.  Neurological: Negative.  Negative for dizziness and headaches.  Endo/Heme/Allergies: Negative.   All other systems reviewed and are negative.  Vitals:   05/25/17 1712  BP: 126/72  Pulse: 68  Resp: 17  Temp: 98.1 F (36.7 C)  SpO2: 98%     Physical Exam  Constitutional: She is oriented to person, place, and time. She appears well-developed and well-nourished.  HENT:  Head: Normocephalic and atraumatic.  Nose: Nose normal.  Mouth/Throat: Oropharynx is clear and moist.  Eyes: Pupils are equal, round, and reactive to light. Conjunctivae and EOM are normal.  Neck: Normal range of motion. Neck supple.  Cardiovascular: Normal rate.   Pulmonary/Chest: Effort  normal.  Musculoskeletal: Normal range of motion.  Lymphadenopathy:    She has no cervical adenopathy.  Neurological: She is alert and oriented to person, place, and time.  Skin: Skin is warm and dry. Capillary refill takes less than 2 seconds. No rash noted.  Psychiatric: She has a normal mood and affect. Her behavior is normal.  Vitals reviewed.    ASSESSMENT & PLAN: Kennya was seen today for facial swelling.  Diagnoses and all orders for this visit:  Sialolithiasis of submandibular gland  Other orders -     predniSONE (DELTASONE) 20 MG tablet; Take 1 tablet (20 mg total) by mouth daily with breakfast.   Patient Instructions  IF you received an x-ray today, you will receive an invoice from [facility]. Please contact [facility] at [phone] with questions or concerns regarding your invoice.   IF you received labwork today, you will receive an invoice from [facility]. Please contact [facility] at [phone] with questions or concerns regarding your invoice.   Our billing staff will not be able to assist you with questions regarding bills from these companies.  You will be contacted with the lab results as soon as they are available. The fastest way to get your results is to activate your My Chart account. Instructions are located on the last page of this paperwork. If you have not heard from [place] regarding the results in 2 weeks, please contact this office.     Salivary Stone A salivary stone is a mineral deposit that builds up in the ducts that drain your salivary glands. Most salivary gland stones are made of calcium. When a stone forms, saliva can back up into the gland and cause painful swelling. Your salivary glands are the glands that produce spit (saliva). You have six major salivary glands. Each gland has a duct that carries saliva into your mouth. Saliva keeps your mouth moist and breaks down the food that you eat. It also helps to prevent  tooth decay. Two salivary glands are located just in front of your ears (parotid). The ducts for these glands open up inside your cheeks, near your back teeth. You also have two glands under your tongue (sublingual) and two glands under your jaw (submandibular). The ducts for these glands open under your tongue. A stone can form in any salivary gland. The most common place for a salivary stone to develop is in a submandibular salivary gland. What are the causes? Any condition that reduces the flow of saliva may lead to stone formation. It is not known why some people form stones and others do not. What increases the risk? You may be more likely to develop a salivary stone if you:  Are female.  Do not drink enough water.  Smoke.  Have high blood pressure.  Have gout.  Have diabetes.  What are the signs or symptoms? The main sign of a salivary gland stone is sudden swelling of a salivary gland when eating. This usually happens under the jaw on one side. Other signs and symptoms include:  Swelling of the cheek or under the tongue when eating.  Pain in the swollen area.  Trouble chewing or swallowing.  Swelling that goes down after eating.  How is this diagnosed? Your health care provider may diagnose a salivary gland stone based on your signs and symptoms. The health care provider will also do a physical exam. In many cases, a stone can be felt in a duct inside your mouth. You may need to see an ear, nose, and throat specialist (ENT or otolaryngologist) for diagnosis and treatment. You may also need to have diagnostic tests. These may include imaging studies to check for a stone, such as:  X-rays.  Ultrasound.  CT scan.  MRI.  How is this treated? Home care may be enough to treat a small stone that is not causing symptoms. Treatment of a stone that is large enough to cause symptoms may include:  Probing and widening the duct to allow the stone to pass.  Inserting a thin,  flexible scope (endoscope) into the duct to locate and remove the stone.  Breaking up the stone with sound waves.  Removing the entire salivary  gland.  Follow these instructions at home:  Drink enough fluid to keep your urine clear or pale yellow.  Follow these instructions every few hours: ? Suck on a lemon candy to stimulate the flow of saliva. ? Put a hot compress over the gland. ? Gently massage the gland.  Do not use any tobacco products, including cigarettes, chewing tobacco, or electronic cigarettes. If you need help quitting, ask your health care provider. Contact a health care provider if:  You have pain and swelling in your face, jaw, or mouth after eating.  You have persistent swelling in any of these places: ? In front of your ear. ? Under your jaw. ? Inside your mouth. Get help right away if:  You have pain and swelling in your face, jaw, or mouth that are getting worse.  Your pain and swelling make it hard to swallow or breathe. This information is not intended to replace advice given to you by your health care provider. Make sure you discuss any questions you have with your health care provider. Document Released: 10/07/2004 Document Revised: 02/05/2016 Document Reviewed: 01/30/2014 Elsevier Interactive Patient Education  2018 Elsevier Inc.      Agustina Caroli, MD [facility]

## 2017-05-25 NOTE — Patient Instructions (Addendum)
IF you received an x-ray today, you will receive an invoice from [facility]. Please contact [facility] at [phone] with questions or concerns regarding your invoice.   IF you received labwork today, you will receive an invoice from [facility]. Please contact [facility] at [phone] with questions or concerns regarding your invoice.   Our billing staff will not be able to assist you with questions regarding bills from these companies.  You will be contacted with the lab results as soon as they are available. The fastest way to get your results is to activate your My Chart account. Instructions are located on the last page of this paperwork. If you have not heard from [place] regarding the results in 2 weeks, please contact this office.     Salivary Stone A salivary stone is a mineral deposit that builds up in the ducts that drain your salivary glands. Most salivary gland stones are made of calcium. When a stone forms, saliva can back up into the gland and cause painful swelling. Your salivary glands are the glands that produce spit (saliva). You have six major salivary glands. Each gland has a duct that carries saliva into your mouth. Saliva keeps your mouth moist and breaks down the food that you eat. It also helps to prevent tooth decay. Two salivary glands are located just in front of your ears (parotid). The ducts for these glands open up inside your cheeks, near your back teeth. You also have two glands under your tongue (sublingual) and two glands under your jaw (submandibular). The ducts for these glands open under your tongue. A stone can form in any salivary gland. The most common place for a salivary stone to develop is in a submandibular salivary gland. What are the causes? Any condition that reduces the flow of saliva may lead to stone formation. It is not known why some people form stones and others do not. What increases the risk? You may be more likely to  develop a salivary stone if you:  Are female.  Do not drink enough water.  Smoke.  Have high blood pressure.  Have gout.  Have diabetes.  What are the signs or symptoms? The main sign of a salivary gland stone is sudden swelling of a salivary gland when eating. This usually happens under the jaw on one side. Other signs and symptoms include:  Swelling of the cheek or under the tongue when eating.  Pain in the swollen area.  Trouble chewing or swallowing.  Swelling that goes down after eating.  How is this diagnosed? Your health care provider may diagnose a salivary gland stone based on your signs and symptoms. The health care provider will also do a physical exam. In many cases, a stone can be felt in a duct inside your mouth. You may need to see an ear, nose, and throat specialist (ENT or otolaryngologist) for diagnosis and treatment. You may also need to have diagnostic tests. These may include imaging studies to check for a stone, such as:  X-rays.  Ultrasound.  CT scan.  MRI.  How is this treated? Home care may be enough to treat a small stone that is not causing symptoms. Treatment of a stone that is large enough to cause symptoms may include:  Probing and widening the duct to allow the stone to pass.  Inserting a thin, flexible scope (endoscope) into the duct to locate and remove the stone.  Breaking up the stone with sound waves.  Removing the  entire salivary gland.  Follow these instructions at home:  Drink enough fluid to keep your urine clear or pale yellow.  Follow these instructions every few hours: ? Suck on a lemon candy to stimulate the flow of saliva. ? Put a hot compress over the gland. ? Gently massage the gland.  Do not use any tobacco products, including cigarettes, chewing tobacco, or electronic cigarettes. If you need help quitting, ask your health care provider. Contact a health care provider if:  You have pain and swelling in your face,  jaw, or mouth after eating.  You have persistent swelling in any of these places: ? In front of your ear. ? Under your jaw. ? Inside your mouth. Get help right away if:  You have pain and swelling in your face, jaw, or mouth that are getting worse.  Your pain and swelling make it hard to swallow or breathe. This information is not intended to replace advice given to you by your health care provider. Make sure you discuss any questions you have with your health care provider. Document Released: 10/07/2004 Document Revised: 02/05/2016 Document Reviewed: 01/30/2014 Elsevier Interactive Patient Education  2018 [employer].

## 2017-06-03 ENCOUNTER — Encounter: Payer: Self-pay | Admitting: Internal Medicine

## 2017-07-07 DIAGNOSIS — E663 Overweight: Secondary | ICD-10-CM | POA: Diagnosis not present

## 2017-07-07 DIAGNOSIS — R002 Palpitations: Secondary | ICD-10-CM | POA: Diagnosis not present

## 2017-07-07 DIAGNOSIS — I10 Essential (primary) hypertension: Secondary | ICD-10-CM | POA: Diagnosis not present

## 2017-07-07 DIAGNOSIS — F411 Generalized anxiety disorder: Secondary | ICD-10-CM | POA: Diagnosis not present

## 2017-07-09 ENCOUNTER — Encounter (HOSPITAL_COMMUNITY): Payer: Self-pay

## 2017-07-09 ENCOUNTER — Emergency Department (HOSPITAL_COMMUNITY)
Admission: EM | Admit: 2017-07-09 | Discharge: 2017-07-09 | Disposition: A | Discharge status: Home / Self Care | Payer: BLUE CROSS/BLUE SHIELD | Attending: Emergency Medicine | Admitting: Emergency Medicine

## 2017-07-09 DIAGNOSIS — R002 Palpitations: Secondary | ICD-10-CM | POA: Diagnosis not present

## 2017-07-09 DIAGNOSIS — R51 Headache: Secondary | ICD-10-CM | POA: Insufficient documentation

## 2017-07-09 DIAGNOSIS — I10 Essential (primary) hypertension: Secondary | ICD-10-CM | POA: Insufficient documentation

## 2017-07-09 DIAGNOSIS — R519 Headache, unspecified: Secondary | ICD-10-CM

## 2017-07-09 DIAGNOSIS — R5383 Other fatigue: Secondary | ICD-10-CM | POA: Insufficient documentation

## 2017-07-09 DIAGNOSIS — Z79899 Other long term (current) drug therapy: Secondary | ICD-10-CM | POA: Insufficient documentation

## 2017-07-09 DIAGNOSIS — R0789 Other chest pain: Secondary | ICD-10-CM | POA: Diagnosis not present

## 2017-07-09 LAB — CBC WITH DIFFERENTIAL/PLATELET
BASOS ABS: 0 10*3/uL (ref 0.0–0.1)
Basophils Relative: 0 %
EOS ABS: 0.1 10*3/uL (ref 0.0–0.7)
Eosinophils Relative: 1 %
HCT: 38.2 % (ref 36.0–46.0)
Hemoglobin: 12.5 g/dL (ref 12.0–15.0)
LYMPHS ABS: 1.3 10*3/uL (ref 0.7–4.0)
Lymphocytes Relative: 22 %
MCH: 24.2 pg — AB (ref 26.0–34.0)
MCHC: 32.7 g/dL (ref 30.0–36.0)
MCV: 73.9 fL — ABNORMAL LOW (ref 78.0–100.0)
MONO ABS: 0.7 10*3/uL (ref 0.1–1.0)
Monocytes Relative: 11 %
NEUTROS ABS: 3.9 10*3/uL (ref 1.7–7.7)
Neutrophils Relative %: 66 %
Platelets: 224 10*3/uL (ref 150–400)
RBC: 5.17 MIL/uL — ABNORMAL HIGH (ref 3.87–5.11)
RDW: 14.1 % (ref 11.5–15.5)
WBC: 6 10*3/uL (ref 4.0–10.5)

## 2017-07-09 LAB — BASIC METABOLIC PANEL
ANION GAP: 7 (ref 5–15)
BUN: 12 mg/dL (ref 6–20)
CHLORIDE: 107 mmol/L (ref 101–111)
CO2: 26 mmol/L (ref 22–32)
Calcium: 8.9 mg/dL (ref 8.9–10.3)
Creatinine, Ser: 0.77 mg/dL (ref 0.44–1.00)
GFR calc Af Amer: >60 mL/min (ref >60)
GFR calc non Af Amer: >60 mL/min (ref >60)
Glucose, Bld: 97 mg/dL (ref 65–99)
POTASSIUM: 3.7 mmol/L (ref 3.5–5.1)
Sodium: 140 mmol/L (ref 135–145)

## 2017-07-09 LAB — I-STAT TROPONIN, ED: Troponin i, poc: 0 ng/mL (ref 0.00–0.08)

## 2017-07-09 NOTE — Discharge Instructions (Signed)
Avoid stressors. Eat healthy, low salt diet. Rest. Follow up next week for BP recheck

## 2017-07-09 NOTE — ED Provider Notes (Signed)
[facility] DEPT Provider Note   CSN: [id number] Arrival date & time: 07/09/17  0342     History   Chief Complaint Chief Complaint  Patient presents with  . Hypertension    HPI [name] is a 56 y.o. female.  HPI [name] is a 56 y.o. female presents to emergency department complaining of headache, elevated blood pressure, generalized weakness.  Patient states she has not been feeling well over the last several days.  She states she has noticed her blood pressure has been higher than normal.  States her normal blood pressure is around 110.  She reports that her blood pressure has been around 295 systolic.  She has told this to her primary care doctor who told her to increase her losartan to 50 mg from 25.  She states last night she went to bed with a mild headache.  She took Advil prior to going to bed.  She states Advil did help her head a little.  She woke up at 3 in the morning and cannot go back to sleep.  She checked her blood pressure and it was  154/80.  She became concerned, took another dose of her blood pressure medicines and came to the ER.  She states she also had some chest pressure and palpitations which started this morning.  She reports history of palpitations and states she was told it was PVCs.  She reports increased fatigue intake in the last few days.  Denies any new medications.  Denies any drugs or alcohol. Past Medical History:  Diagnosis Date  . Anemia   . Colon polyp 08/28/2012   Tubular adenoma  . Dense breast   . Genital warts   . GERD (gastroesophageal reflux disease)   . Hemorrhoids   . Hx of cardiac catheterization 2009   normal coronary arteries  . Hx of menorrhagia    Novasure  . Hypokalemia   . Hypomagnesemia   . Hypophosphatemia   . Lactose intolerance in adult   . Mitral valve prolapse   . Normal cardiac stress test 03/09/2012   no ischemia  . Positive PPD   . Rapid heart beat    States "irreg heart  beat"  . Thyroid disease    Thyroid nodules    Patient Active Problem List   Diagnosis Date Noted  . Generalized abdominal pain 05/04/2017  . History of diverticulosis 05/04/2017  . Diarrhea of presumed infectious origin 05/04/2017  . LUQ abdominal pain 12/04/2015  . Belching 12/04/2015  . Abdominal discomfort 04/08/2015  . Hypercalcemia 07/11/2013  . Sweating 07/05/2013  . Other malaise and fatigue 07/05/2013  . Hypocalcemia 07/05/2013  . Palpitations 07/05/2013  . Low blood pressure reading 07/05/2013  . Pes planus (flat feet) 03/29/2013  . Numbness of toes 03/29/2013  . Toe pain 03/29/2013  . History of PSVT (paroxysmal supraventricular tachycardia) 06/24/2012  . Contact dermatitis 06/23/2012  . Hypokalemia 06/23/2012  . Intertrigo 06/23/2012  . Hx of cardiac catheterization   . Positive PPD   . Multinodular goiter 05/12/2012  . Sinus tachycardia 03/09/2012  . SLEEP APNEA 01/19/2008  . GERD 01/18/2008  . DYSPEPSIA 01/18/2008  . CONSTIPATION 01/18/2008  . IRRITABLE BOWEL SYNDROME 01/18/2008    Past Surgical History:  Procedure Laterality Date  . CARDIAC CATHETERIZATION     x2 with normal results per pt  . NOVASURE ABLATION    . UPPER GASTROINTESTINAL ENDOSCOPY  May 2013    OB History    No data available  Home Medications    Prior to Admission medications   Medication Sig Start Date End Date Taking? Authorizing Provider  cholecalciferol (VITAMIN D) 1000 UNITS tablet Take 2,000 Units by mouth every morning.     [provider]  losartan (COZAAR) 25 MG tablet Take 25 mg by mouth daily.    [provider]  metoprolol tartrate (LOPRESSOR) 25 MG tablet Take 25 mg by mouth 2 (two) times daily. 12/29/15   [provider]  Omega-3 Fatty Acids (FISH OIL PO) Take 2 capsules by mouth every other day.     [provider]  pantoprazole (PROTONIX) 40 MG tablet Take 40 mg by mouth daily as needed (acid reflux).     [provider]  PARoxetine (PAXIL-CR) 25 MG 24 hr tablet Take 25 mg by mouth every morning.     [provider]  potassium chloride SA (K-DUR,KLOR-CON) 20 MEQ tablet TAKE 1 TABLET(20 MEQ) BY MOUTH TWICE DAILY 12/31/16   Panosh, Standley Brooking, MD  vitamin E 400 UNIT capsule Take 400 Units by mouth daily.    [provider]    Family History Family History  Problem Relation Age of Onset  . Heart disease Father        died Mi 24  . Hypertension Maternal Grandfather   . Diabetes Maternal Grandfather   . Heart disease Maternal Grandfather   . Colon cancer Maternal Grandfather   . Diabetes Maternal Grandmother     Social History Social History  Substance Use Topics  . Smoking status: Never Smoker  . Smokeless tobacco: Never Used  . Alcohol use No     Allergies   Ceftriaxone sodium and Doxycycline   Review of Systems Review of Systems  Constitutional: Positive for fatigue. Negative for chills and fever.  Respiratory: Positive for chest tightness. Negative for cough and shortness of breath.   Cardiovascular: Positive for palpitations. Negative for chest pain and leg swelling.  Gastrointestinal: Negative for abdominal pain, diarrhea, nausea and vomiting.  Genitourinary: Negative for dysuria, flank pain and pelvic pain.  Musculoskeletal: Negative for arthralgias, myalgias, neck pain and neck stiffness.  Skin: Negative for rash.  Neurological: Positive for headaches. Negative for dizziness, weakness, light-headedness and numbness.  All other systems reviewed and are negative.    Physical Exam Updated Vital Signs BP 120/82 (BP Location: Left Arm)   Pulse 70   Temp 98.6 F (37 C) (Oral)   Resp 16   LMP 09/18/2016 (Exact Date)   SpO2 99%   Physical Exam  Constitutional: She is oriented to person, place, and time. She appears well-developed and well-nourished. No distress.  HENT:  Head: Normocephalic.  Eyes: Conjunctivae are normal.  Neck: Neck supple.    Cardiovascular: Normal rate, regular rhythm and normal heart sounds.   Pulmonary/Chest: Effort normal and breath sounds normal. No respiratory distress. She has no wheezes. She has no rales.  Abdominal: Soft. Bowel sounds are normal. She exhibits no distension. There is no tenderness. There is no rebound.  Musculoskeletal: She exhibits no edema.  Neurological: She is alert and oriented to person, place, and time.  Skin: Skin is warm and dry.  Psychiatric: She has a normal mood and affect. Her behavior is normal.  Nursing note and vitals reviewed.    ED Treatments / Results  Labs (all labs ordered are listed, but only abnormal results are displayed) Labs Reviewed  CBC WITH DIFFERENTIAL/PLATELET - Abnormal; Notable for the following:       Result Value  RBC 5.17 (*)    MCV 73.9 (*)    MCH 24.2 (*)    All other components within normal limits  BASIC METABOLIC PANEL  I-STAT TROPONIN, ED    EKG ED ECG REPORT   Date: 07/09/2017  Rate: 62  Rhythm: normal sinus rhythm  QRS Axis: normal  Intervals: normal  ST/T Wave abnormalities: normal  Conduction Disutrbances:none  Narrative Interpretation:   Old EKG Reviewed: unchanged  I have personally reviewed the EKG tracing and agree with the computerized printout as noted.  Radiology No results found.  Procedures Procedures (including critical care time)  Medications Ordered in ED Medications - No data to display   Initial Impression / Assessment and Plan / ED Course  I have reviewed the triage vital signs and the nursing notes.  Pertinent labs & imaging results that were available during my care of the patient were reviewed by me and considered in my medical decision making (see chart for details).     Patient with slightly elevated blood pressure last night, palpitations, chest pressure.  States "I just do not feel good."  She has no other specific complaints other than mild headache.  Will check basic labs, EKG,  troponin.  7:49 AM Patient's labs are unremarkable.  Troponin is negative.  EKG with no acute findings.  Blood pressure here is 120/82, rechecked is 100/64.  Her exam is unremarkable.  Patient reassured.  Discussed low-salt diet, getting plenty of sleep, avoiding stressors.  Following recheck of the blood pressure next week.  Patient agreed.  Vitals:   07/09/17 0544 07/09/17 0737  BP: 120/82 100/64  Pulse: 70 67  Resp: 16 17  Temp:  99.1 F (37.3 C)  SpO2: 99% 98%    Final Clinical Impressions(s) / ED Diagnoses   Final diagnoses:  Nonintractable headache, unspecified chronicity pattern, unspecified headache type  Palpitations    New Prescriptions New Prescriptions   No medications on file     Jeannett Senior, PA-C 07/09/17 0754    Shanon Rosser, MD 07/09/17 2243

## 2017-07-09 NOTE — ED Triage Notes (Signed)
Pt complains of a headache and palpitations since yesterday and she feels like her blood pressure is elevated She took 50 mg of Losartin and 25mg  of lopressor about one hour ago

## 2017-07-25 ENCOUNTER — Other Ambulatory Visit: Payer: Self-pay | Admitting: Cardiovascular Disease

## 2017-07-25 ENCOUNTER — Ambulatory Visit
Admission: RE | Admit: 2017-07-25 | Discharge: 2017-07-25 | Disposition: A | Discharge status: Home / Self Care | Payer: BLUE CROSS/BLUE SHIELD | Source: Ambulatory Visit | Attending: Cardiovascular Disease | Admitting: Cardiovascular Disease

## 2017-07-25 DIAGNOSIS — F411 Generalized anxiety disorder: Secondary | ICD-10-CM | POA: Diagnosis not present

## 2017-07-25 DIAGNOSIS — R002 Palpitations: Secondary | ICD-10-CM | POA: Diagnosis not present

## 2017-07-25 DIAGNOSIS — R1013 Epigastric pain: Secondary | ICD-10-CM

## 2017-07-25 DIAGNOSIS — R140 Abdominal distension (gaseous): Secondary | ICD-10-CM | POA: Diagnosis not present

## 2017-07-25 DIAGNOSIS — E663 Overweight: Secondary | ICD-10-CM | POA: Diagnosis not present

## 2017-07-25 DIAGNOSIS — R1084 Generalized abdominal pain: Secondary | ICD-10-CM | POA: Diagnosis not present

## 2017-07-30 ENCOUNTER — Ambulatory Visit (INDEPENDENT_AMBULATORY_CARE_PROVIDER_SITE_OTHER): Payer: BLUE CROSS/BLUE SHIELD

## 2017-07-30 ENCOUNTER — Ambulatory Visit (HOSPITAL_COMMUNITY)
Admission: EM | Admit: 2017-07-30 | Discharge: 2017-07-30 | Disposition: A | Discharge status: Home / Self Care | Payer: BLUE CROSS/BLUE SHIELD | Attending: Family Medicine | Admitting: Family Medicine

## 2017-07-30 ENCOUNTER — Encounter (HOSPITAL_COMMUNITY): Payer: Self-pay | Admitting: Family Medicine

## 2017-07-30 DIAGNOSIS — J069 Acute upper respiratory infection, unspecified: Secondary | ICD-10-CM

## 2017-07-30 DIAGNOSIS — R05 Cough: Secondary | ICD-10-CM | POA: Diagnosis not present

## 2017-07-30 DIAGNOSIS — R002 Palpitations: Secondary | ICD-10-CM

## 2017-07-30 DIAGNOSIS — B9789 Other viral agents as the cause of diseases classified elsewhere: Secondary | ICD-10-CM | POA: Diagnosis not present

## 2017-07-30 LAB — POCT I-STAT, CHEM 8
BUN: 8 mg/dL (ref 6–20)
CHLORIDE: 105 mmol/L (ref 101–111)
CREATININE: 0.8 mg/dL (ref 0.44–1.00)
Calcium, Ion: 1.19 mmol/L (ref 1.15–1.40)
GLUCOSE: 87 mg/dL (ref 65–99)
HEMATOCRIT: 41 % (ref 36.0–46.0)
Hemoglobin: 13.9 g/dL (ref 12.0–15.0)
POTASSIUM: 3.7 mmol/L (ref 3.5–5.1)
Sodium: 142 mmol/L (ref 135–145)
TCO2: 25 mmol/L (ref 22–32)

## 2017-07-30 NOTE — Discharge Instructions (Signed)
So sorry you are feeling poorly. The good news is the chest xray is normal and your blood work is normal as well. If you continue to have blood tinged sputum then suggest you follow up with Dr. Regis Bill for a further work up. Also you have a viral infection or cold. Treat with Delsym as directed for cough. No palpitations noted on today's exam. Followup with cardiologist if they continue.

## 2017-07-30 NOTE — ED Provider Notes (Signed)
[facility]    CSN: [id number] Arrival date & time: 07/30/17  1458     History   Chief Complaint Chief Complaint  Patient presents with  . Cough  . Sore Throat    HPI Chrystine Frogge is a 56 y.o. female.   Who presents with cough x 3 days and 1 episode of hemoptysis though states it was blood tinged. Has not happened since. She feels fatigued. She also states that at times she feels palpitations. No dyspnea or chest pain. She was evaluated in the ED 10/27 for generalized complaints and at that time cardiac work up normal. She denies fevers or chills. See remainder of ROS.       Past Medical History:  Diagnosis Date  . Anemia   . Colon polyp 08/28/2012   Tubular adenoma  . Dense breast   . Genital warts   . GERD (gastroesophageal reflux disease)   . Hemorrhoids   . Hx of cardiac catheterization 2009   normal coronary arteries  . Hx of menorrhagia    Novasure  . Hypokalemia   . Hypomagnesemia   . Hypophosphatemia   . Lactose intolerance in adult   . Mitral valve prolapse   . Normal cardiac stress test 03/09/2012   no ischemia  . Positive PPD   . Rapid heart beat    States "irreg heart beat"  . Thyroid disease    Thyroid nodules    Patient Active Problem List   Diagnosis Date Noted  . Generalized abdominal pain 05/04/2017  . History of diverticulosis 05/04/2017  . Diarrhea of presumed infectious origin 05/04/2017  . LUQ abdominal pain 12/04/2015  . Belching 12/04/2015  . Abdominal discomfort 04/08/2015  . Hypercalcemia 07/11/2013  . Sweating 07/05/2013  . Other malaise and fatigue 07/05/2013  . Hypocalcemia 07/05/2013  . Palpitations 07/05/2013  . Low blood pressure reading 07/05/2013  . Pes planus (flat feet) 03/29/2013  . Numbness of toes 03/29/2013  . Toe pain 03/29/2013  . History of PSVT (paroxysmal supraventricular tachycardia) 06/24/2012  . Contact dermatitis 06/23/2012  . Hypokalemia 06/23/2012  . Intertrigo 06/23/2012  . Hx of  cardiac catheterization   . Positive PPD   . Multinodular goiter 05/12/2012  . Sinus tachycardia 03/09/2012  . SLEEP APNEA 01/19/2008  . GERD 01/18/2008  . DYSPEPSIA 01/18/2008  . CONSTIPATION 01/18/2008  . IRRITABLE BOWEL SYNDROME 01/18/2008    Past Surgical History:  Procedure Laterality Date  . CARDIAC CATHETERIZATION     x2 with normal results per pt  . NOVASURE ABLATION    . UPPER GASTROINTESTINAL ENDOSCOPY  May 2013    OB History    No data available       Home Medications    Prior to Admission medications   Medication Sig Start Date End Date Taking? Authorizing Provider  cholecalciferol (VITAMIN D) 1000 UNITS tablet Take 2,000 Units by mouth every morning.     [provider]  losartan (COZAAR) 25 MG tablet Take 25 mg by mouth daily.    [provider]  metoprolol tartrate (LOPRESSOR) 25 MG tablet Take 25 mg by mouth 2 (two) times daily. 12/29/15   [provider]  Omega-3 Fatty Acids (FISH OIL PO) Take 2 capsules by mouth every other day.     [provider]  pantoprazole (PROTONIX) 40 MG tablet Take 40 mg by mouth daily as needed (acid reflux).     [provider]  PARoxetine (PAXIL-CR) 25 MG 24 hr tablet Take 25 mg  by mouth every morning.     [provider]  potassium chloride SA (K-DUR,KLOR-CON) 20 MEQ tablet TAKE 1 TABLET(20 MEQ) BY MOUTH TWICE DAILY 12/31/16   Panosh, Standley Brooking, MD  vitamin E 400 UNIT capsule Take 400 Units by mouth daily.    [provider]    Family History Family History  Problem Relation Age of Onset  . Heart disease Father        died Mi 46  . Hypertension Maternal Grandfather   . Diabetes Maternal Grandfather   . Heart disease Maternal Grandfather   . Colon cancer Maternal Grandfather   . Diabetes Maternal Grandmother     Social History Social History   Tobacco Use  . Smoking status: Never Smoker  . Smokeless tobacco: Never Used  Substance Use Topics  . Alcohol  use: No  . Drug use: No     Allergies   Ceftriaxone sodium and Doxycycline   Review of Systems Review of Systems  Constitutional: Positive for fatigue. Negative for chills and fever.  HENT: Negative.   Eyes: Negative.   Respiratory: Positive for cough. Negative for apnea, shortness of breath and wheezing.   Cardiovascular: Positive for palpitations. Negative for chest pain and leg swelling.  Gastrointestinal: Negative.   Skin: Negative for rash.  Allergic/Immunologic: Negative for environmental allergies.  Neurological: Negative for weakness and headaches.  Psychiatric/Behavioral: Negative.      Physical Exam Triage Vital Signs ED Triage Vitals [07/30/17 1514]  Enc Vitals Group     BP 121/80     Pulse Rate 72     Resp 18     Temp 98.4 F (36.9 C)     Temp src      SpO2 99 %     Weight      Height      Head Circumference      Peak Flow      Pain Score      Pain Loc      Pain Edu?      Excl. in [place]?    No data found.  Updated Vital Signs BP 121/80   Pulse 72   Temp 98.4 F (36.9 C)   Resp 18   LMP 09/18/2016 (Exact Date)   SpO2 99%   Visual Acuity Right Eye Distance:   Left Eye Distance:   Bilateral Distance:    Right Eye Near:   Left Eye Near:    Bilateral Near:     Physical Exam  Constitutional: She is oriented to person, place, and time. She appears well-developed and well-nourished.  Non-toxic appearance. She does not appear ill. No distress.  HENT:  Head: Normocephalic and atraumatic.  Right Ear: Tympanic membrane and ear canal normal. No drainage, swelling or tenderness. No middle ear effusion.  Left Ear: Tympanic membrane and ear canal normal. No tenderness.  No middle ear effusion.  Mouth/Throat: Oropharynx is clear and moist.  Neck:  Fullness noted parathyroid  Cardiovascular: Normal rate and regular rhythm.  Pulmonary/Chest: Effort normal and breath sounds normal. No respiratory distress. She has no wheezes.  Neurological: She is alert  and oriented to person, place, and time.  Skin: Skin is warm and dry. No rash noted. No erythema.  Psychiatric: She has a normal mood and affect. Her behavior is normal.  Nursing note and vitals reviewed.    UC Treatments / Results  Labs (all labs ordered are listed, but only abnormal results are displayed) Labs Reviewed  POCT I-STAT, CHEM 8  EKG  EKG Interpretation None       Radiology Dg Chest 2 View  Result Date: 07/30/2017 CLINICAL DATA:  Upper respiratory symptoms for 3 days. Productive cough. Some posterior rib pain. Dizziness. EXAM: CHEST  2 VIEW COMPARISON:  Chest x-ray dated 10/13/2016. FINDINGS: Heart size and mediastinal contours are within normal limits. Lungs are clear. No pleural effusion or pneumothorax seen. Osseous structures about the chest are unremarkable. Specifically, no rib fracture or displacement seen. IMPRESSION: No active cardiopulmonary disease. No evidence of pneumonia or pulmonary edema. Electronically Signed   By: Franki Cabot M.D.   On: 07/30/2017 17:18    Procedures Procedures (including critical care time)  Medications Ordered in UC Medications - No data to display   Initial Impression / Assessment and Plan / UC Course  I have reviewed the triage vital signs and the nursing notes.  Pertinent labs & imaging results that were available during my care of the patient were reviewed by me and considered in my medical decision making (see chart for details).   Patient with viral uri based on exam. CXR and blood work performed in the setting of blood tinged sputum. If this returns f/u with PCP for further evaluation. No IRR on exam today. Suggest f/u with Cardiologist for palpitations if continue. If her symptoms become more acute or worrisome then please present to the ED.     Final Clinical Impressions(s) / UC Diagnoses   Final diagnoses:  Viral URI with cough  Palpitations    ED Discharge Orders    None       Controlled  Substance Prescriptions [place] Controlled Substance Registry consulted? Not Applicable   Bjorn Pippin, PA-C 07/30/17 1736

## 2017-07-30 NOTE — ED Triage Notes (Signed)
Pt here for URI symptoms x 3 days. Reports also intermittent palpitations associated. Currently in triage HR regular and rate 78.

## 2017-08-02 DIAGNOSIS — F411 Generalized anxiety disorder: Secondary | ICD-10-CM | POA: Diagnosis not present

## 2017-08-02 DIAGNOSIS — F4310 Post-traumatic stress disorder, unspecified: Secondary | ICD-10-CM | POA: Diagnosis not present

## 2017-08-02 DIAGNOSIS — F331 Major depressive disorder, recurrent, moderate: Secondary | ICD-10-CM | POA: Diagnosis not present

## 2017-08-09 DIAGNOSIS — F331 Major depressive disorder, recurrent, moderate: Secondary | ICD-10-CM | POA: Diagnosis not present

## 2017-08-09 DIAGNOSIS — F4310 Post-traumatic stress disorder, unspecified: Secondary | ICD-10-CM | POA: Diagnosis not present

## 2017-08-09 DIAGNOSIS — F411 Generalized anxiety disorder: Secondary | ICD-10-CM | POA: Diagnosis not present

## 2017-08-11 ENCOUNTER — Other Ambulatory Visit: Payer: Self-pay

## 2017-08-11 ENCOUNTER — Ambulatory Visit: Payer: BLUE CROSS/BLUE SHIELD | Admitting: Physician Assistant

## 2017-08-11 ENCOUNTER — Ambulatory Visit: Payer: BLUE CROSS/BLUE SHIELD | Admitting: Emergency Medicine

## 2017-08-11 VITALS — BP 112/62 | HR 88 | Temp 98.3°F | Resp 16 | Ht 66.0 in | Wt 199.0 lb

## 2017-08-11 DIAGNOSIS — R300 Dysuria: Secondary | ICD-10-CM

## 2017-08-11 DIAGNOSIS — N390 Urinary tract infection, site not specified: Secondary | ICD-10-CM

## 2017-08-11 LAB — POC MICROSCOPIC URINALYSIS (UMFC): Mucus: ABSENT

## 2017-08-11 LAB — POCT URINALYSIS DIP (MANUAL ENTRY)
BILIRUBIN UA: NEGATIVE mg/dL
Bilirubin, UA: NEGATIVE
Blood, UA: NEGATIVE
Glucose, UA: NEGATIVE mg/dL
Nitrite, UA: NEGATIVE
Protein Ur, POC: NEGATIVE mg/dL
SPEC GRAV UA: 1.02 (ref 1.010–1.025)
UROBILINOGEN UA: 0.2 U/dL
pH, UA: 5.5 (ref 5.0–8.0)

## 2017-08-11 MED ORDER — SULFAMETHOXAZOLE-TRIMETHOPRIM 800-160 MG PO TABS: 1.0000 | ORAL_TABLET | Freq: Two times a day (BID) | ORAL | 0 refills | Status: DC

## 2017-08-11 NOTE — Patient Instructions (Addendum)
Please continue to hydrate well.   Do the miralax no longer than 3 days.  You can take tylenol for pain.   Urinary Tract Infection, Adult A urinary tract infection (UTI) is an infection of any part of the urinary tract. The urinary tract includes the:  Kidneys.  Ureters.  Bladder.  Urethra.  These organs make, store, and get rid of pee (urine) in the body. Follow these instructions at home:  Take over-the-counter and prescription medicines only as told by your doctor.  If you were prescribed an antibiotic medicine, take it as told by your doctor. Do not stop taking the antibiotic even if you start to feel better.  Avoid the following drinks: ? Alcohol. ? Caffeine. ? Tea. ? Carbonated drinks.  Drink enough fluid to keep your pee clear or pale yellow.  Keep all follow-up visits as told by your doctor. This is important.  Make sure to: ? Empty your bladder often and completely. Do not to hold pee for long periods of time. ? Empty your bladder before and after sex. ? Wipe from front to back after a bowel movement if you are female. Use each tissue one time when you wipe. Contact a doctor if:  You have back pain.  You have a fever.  You feel sick to your stomach (nauseous).  You throw up (vomit).  Your symptoms do not get better after 3 days.  Your symptoms go away and then come back. Get help right away if:  You have very bad back pain.  You have very bad lower belly (abdominal) pain.  You are throwing up and cannot keep down any medicines or water. This information is not intended to replace advice given to you by your health care provider. Make sure you discuss any questions you have with your health care provider. Document Released: 02/16/2008 Document Revised: 02/05/2016 Document Reviewed: 07/21/2015 Elsevier Interactive Patient Education  2018 [employer].     IF you received an x-ray today, you will receive an invoice from [facility].  Please contact [facility] at [phone] with questions or concerns regarding your invoice.   IF you received labwork today, you will receive an invoice from [facility]. Please contact [facility] at [phone] with questions or concerns regarding your invoice.   Our billing staff will not be able to assist you with questions regarding bills from these companies.  You will be contacted with the lab results as soon as they are available. The fastest way to get your results is to activate your My Chart account. Instructions are located on the last page of this paperwork. If you have not heard from [place] regarding the results in 2 weeks, please contact this office.

## 2017-08-11 NOTE — Progress Notes (Signed)
PRIMARY CARE AT [facility] [address], [place] [address] [phone]  Date:  08/11/2017   Name:  [name]   DOB:  10/17/1960   MRN:  [id number]  PCP:  Burnis Medin, MD    History of Present Illness:  [name] is a 56 y.o. female patient who presents to PCP with  Chief Complaint  Patient presents with  . Back Pain    x 2 wks  . urine odor    x 2 wks     Patient complains of 2 weeks of malodorous urine.  She notes no abdominal pain.  She does notes  Some low back pain bilaterally.   No fever. No nausea.  Bowel movements are unchanged, however has bouts of diarrhea.    Patient Active Problem List   Diagnosis Date Noted  . Generalized abdominal pain 05/04/2017  . History of diverticulosis 05/04/2017  . Diarrhea of presumed infectious origin 05/04/2017  . LUQ abdominal pain 12/04/2015  . Belching 12/04/2015  . Abdominal discomfort 04/08/2015  . Hypercalcemia 07/11/2013  . Sweating 07/05/2013  . Other malaise and fatigue 07/05/2013  . Hypocalcemia 07/05/2013  . Palpitations 07/05/2013  . Low blood pressure reading 07/05/2013  . Pes planus (flat feet) 03/29/2013  . Numbness of toes 03/29/2013  . Toe pain 03/29/2013  . History of PSVT (paroxysmal supraventricular tachycardia) 06/24/2012  . Contact dermatitis 06/23/2012  . Hypokalemia 06/23/2012  . Intertrigo 06/23/2012  . Hx of cardiac catheterization   . Positive PPD   . Multinodular goiter 05/12/2012  . Sinus tachycardia 03/09/2012  . SLEEP APNEA 01/19/2008  . GERD 01/18/2008  . DYSPEPSIA 01/18/2008  . CONSTIPATION 01/18/2008  . IRRITABLE BOWEL SYNDROME 01/18/2008    Past Medical History:  Diagnosis Date  . Anemia   . Colon polyp 08/28/2012   Tubular adenoma  . Dense breast   . Genital warts   . GERD (gastroesophageal reflux disease)   . Hemorrhoids   . Hx of cardiac catheterization 2009   normal coronary arteries  . Hx of menorrhagia    Novasure  . Hypokalemia   . Hypomagnesemia    . Hypophosphatemia   . Lactose intolerance in adult   . Mitral valve prolapse   . Normal cardiac stress test 03/09/2012   no ischemia  . Positive PPD   . Rapid heart beat    States "irreg heart beat"  . Thyroid disease    Thyroid nodules    Past Surgical History:  Procedure Laterality Date  . CARDIAC CATHETERIZATION     x2 with normal results per pt  . NOVASURE ABLATION    . UPPER GASTROINTESTINAL ENDOSCOPY  May 2013    Social History   Tobacco Use  . Smoking status: Never Smoker  . Smokeless tobacco: Never Used  Substance Use Topics  . Alcohol use: No  . Drug use: No    Family History  Problem Relation Age of Onset  . Heart disease Father        died Mi 31  . Hypertension Maternal Grandfather   . Diabetes Maternal Grandfather   . Heart disease Maternal Grandfather   . Colon cancer Maternal Grandfather   . Diabetes Maternal Grandmother     Allergies  Allergen Reactions  . Ceftriaxone Sodium Itching  . Doxycycline Rash    Medication list has been reviewed and updated.  Current Outpatient Medications on File Prior to Visit  Medication Sig Dispense Refill  . cholecalciferol (VITAMIN D) 1000 UNITS tablet Take  2,000 Units by mouth every morning.     [name] losartan (COZAAR) 25 MG tablet Take 25 mg by mouth daily.    . metoprolol tartrate (LOPRESSOR) 25 MG tablet Take 25 mg by mouth 2 (two) times daily.  6  . Omega-3 Fatty Acids (FISH OIL PO) Take 2 capsules by mouth every other day.     . pantoprazole (PROTONIX) 40 MG tablet Take 40 mg by mouth daily as needed (acid reflux).     [name] PARoxetine (PAXIL-CR) 25 MG 24 hr tablet Take 25 mg by mouth every morning.     . potassium chloride SA (K-DUR,KLOR-CON) 20 MEQ tablet TAKE 1 TABLET(20 MEQ) BY MOUTH TWICE DAILY 180 tablet 2  . vitamin E 400 UNIT capsule Take 400 Units by mouth daily.    . [DISCONTINUED] mometasone (NASONEX) 50 MCG/ACT nasal spray Place 2 sprays into the nose daily. (Patient not taking: Reported on  10/17/2015) 17 g 0   Current Facility-Administered Medications on File Prior to Visit  Medication Dose Route Frequency Provider Last Rate Last Dose  . nitroGLYCERIN (NITROSTAT) SL tablet 0.3 mg  0.3 mg Sublingual Q5 min PRN Philis Fendt L, PA-C   0.3 mg at 11/22/15 0951    ROS ROS otherwise unremarkable unless listed above.  Physical Examination: BP 112/62   Pulse 88   Temp 98.3 F (36.8 C) (Oral)   Resp 16   Ht 5\' 6"  (1.676 m)   Wt 199 lb (90.3 kg)   LMP 09/18/2016 (Exact Date)   SpO2 100%   BMI 32.12 kg/m  Ideal Body Weight: Weight in (lb) to have BMI = 25: 154.6  Physical Exam  Constitutional: She is oriented to person, place, and time. She appears well-developed and well-nourished. No distress.  HENT:  Head: Normocephalic and atraumatic.  Right Ear: External ear normal.  Left Ear: External ear normal.  Eyes: Conjunctivae and EOM are normal. Pupils are equal, round, and reactive to light.  Cardiovascular: Normal rate.  Pulmonary/Chest: Effort normal. No respiratory distress.  Abdominal: Soft. Normal appearance and bowel sounds are normal. There is tenderness in the left lower quadrant.  Neurological: She is alert and oriented to person, place, and time.  Skin: She is not diaphoretic.  Psychiatric: She has a normal mood and affect. Her behavior is normal.    Results for orders placed or performed in visit on 08/11/17  POCT urinalysis dipstick  Result Value Ref Range   Color, UA yellow yellow   Clarity, UA cloudy (A) clear   Glucose, UA negative negative mg/dL   Bilirubin, UA negative negative   Ketones, POC UA negative negative mg/dL   Spec Grav, UA 1.020 1.010 - 1.025   Blood, UA negative negative   pH, UA 5.5 5.0 - 8.0   Protein Ur, POC negative negative mg/dL   Urobilinogen, UA 0.2 0.2 or 1.0 E.U./dL   Nitrite, UA Negative Negative   Leukocytes, UA Small (1+) (A) Negative  POCT Microscopic Urinalysis ([facility])  Result Value Ref Range   WBC,UR,HPF,POC Few (A)  None WBC/hpf   RBC,UR,HPF,POC None None RBC/hpf   Bacteria Many (A) None, Too numerous to count   Mucus Absent Absent   Epithelial Cells, UR Per Microscopy Few (A) None, Too numerous to count cells/hpf   Assessment and Plan: [name] is a 56 y.o. female who is here today for cc of  Chief Complaint  Patient presents with  . Back Pain    x 2 wks  . urine odor  x 2 wks   Urinary tract infection without hematuria, site unspecified - Plan: sulfamethoxazole-trimethoprim (BACTRIM DS,SEPTRA DS) 800-160 MG tablet  Dysuria - Plan: POCT urinalysis dipstick, POCT Microscopic Urinalysis ([facility]), Urine Culture  Danielle Drape, PA-C [facility] 11/29/20183:01 PM

## 2017-08-12 LAB — URINE CULTURE: Organism ID, Bacteria: NO GROWTH

## 2017-08-15 ENCOUNTER — Encounter: Payer: Self-pay | Admitting: Family Medicine

## 2017-08-15 ENCOUNTER — Ambulatory Visit: Payer: BLUE CROSS/BLUE SHIELD | Admitting: Family Medicine

## 2017-08-15 VITALS — BP 110/70

## 2017-08-15 DIAGNOSIS — K219 Gastro-esophageal reflux disease without esophagitis: Secondary | ICD-10-CM

## 2017-08-15 DIAGNOSIS — J302 Other seasonal allergic rhinitis: Secondary | ICD-10-CM

## 2017-08-15 MED ORDER — PANTOPRAZOLE SODIUM 40 MG PO TBEC: 40.0000 mg | DELAYED_RELEASE_TABLET | Freq: Every day | ORAL | 0 refills | Status: DC

## 2017-08-15 NOTE — Progress Notes (Signed)
Subjective:    Patient ID: [name], female    DOB: 1961-06-18, 56 y.o.   MRN: [id number]  No chief complaint on file.   HPI Patient was seen today for acute concern.  Pt with throat irritation, and cough for 3 days.  Pt endorses increased GERD symtoms.  Endorses increased phlegm, dry throat, and dry nose when laying down.  Also endorses feeling like heart skips a beat after eating.  Was on protonix x 1 wk but stopped as she was feeling better.  Pt endorses eating poorly over the last few wks (increased fast food/greasy food).  Pt also endorses having cold symptoms last wk/uti. Was seen at [facility] Bactrim, but has not taken.    Denies fever, chills, rhinorrhea.  Past Medical History:  Diagnosis Date  . Anemia   . Colon polyp 08/28/2012   Tubular adenoma  . Dense breast   . Genital warts   . GERD (gastroesophageal reflux disease)   . Hemorrhoids   . Hx of cardiac catheterization 2009   normal coronary arteries  . Hx of menorrhagia    Novasure  . Hypokalemia   . Hypomagnesemia   . Hypophosphatemia   . Lactose intolerance in adult   . Mitral valve prolapse   . Normal cardiac stress test 03/09/2012   no ischemia  . Positive PPD   . Rapid heart beat    States "irreg heart beat"  . Thyroid disease    Thyroid nodules    Allergies  Allergen Reactions  . Ceftriaxone Sodium Itching  . Doxycycline Rash    ROS General: Denies fever, chills, night sweats, changes in weight, changes in appetite HEENT: Denies headaches, ear pain, changes in vision, rhinorrhea +  sore throat   CV: Denies CP, palpitations, SOB, orthopnea Pulm: Denies SOB, wheezing  +cough GI: Denies abdominal pain, nausea, vomiting, diarrhea, constipation GU: Denies dysuria, hematuria, frequency, vaginal discharge Msk: Denies muscle cramps, joint pains Neuro: Denies weakness, numbness, tingling Skin: Denies rashes, bruising Psych: Denies depression, anxiety, hallucinations     Objective:    Blood  pressure 110/70, last menstrual period 09/18/2016.   Gen. Pleasant, well-nourished, in no distress, normal affect   HEENT: [place]/AT, face symmetric, conjunctiva clear, no scleral icterus, PERRLA, EOMI, nares patent with dried drainage, pharynx with post nasal drainage,mild  Erythema, no exudate. Lungs: no accessory muscle use, CTAB, no wheezes or rales Cardiovascular: RRR, no m/r/g, no peripheral edema Neuro:  A&Ox3, CN II-XII intact, normal gait Skin:  Warm, no lesions/ rash   Wt Readings from Last 3 Encounters:  08/11/17 199 lb (90.3 kg)  05/25/17 205 lb (93 kg)  05/04/17 203 lb 3.2 oz (92.2 kg)    Lab Results  Component Value Date   WBC 6.0 07/09/2017   HGB 13.9 07/30/2017   HCT 41.0 07/30/2017   PLT 224 07/09/2017   GLUCOSE 87 07/30/2017   CHOL 112 11/22/2016   TRIG 101.0 11/22/2016   HDL 41.80 11/22/2016   LDLCALC 50 11/22/2016   ALT 23 05/04/2017   AST 24 05/04/2017   NA 142 07/30/2017   K 3.7 07/30/2017   CL 105 07/30/2017   CREATININE 0.80 07/30/2017   BUN 8 07/30/2017   CO2 26 07/09/2017   TSH 1.080 10/30/2016   INR 0.95 03/10/2012   HGBA1C 5.6 10/30/2016    Assessment/Plan:  Gastroesophageal reflux disease, esophagitis presence not specified  -avoid food triggers -increase po intake of water -avoid eating 2 hours before bed. - Plan: pantoprazole (PROTONIX) 40 MG  tablet, will try for 3 months.  Seasonal allergies -Will use Flonase nasal spray -Reviewed proper nasal spray use.  F/u in next month, sooner prn.

## 2017-08-15 NOTE — Patient Instructions (Addendum)
Use 1 spray of Flonase in each nostril daily.   Gastroesophageal Reflux Disease, Adult Normally, food travels down the esophagus and stays in the stomach to be digested. However, when a person has gastroesophageal reflux disease (GERD), food and stomach acid move back up into the esophagus. When this happens, the esophagus becomes sore and inflamed. Over time, GERD can create small holes (ulcers) in the lining of the esophagus. What are the causes? This condition is caused by a problem with the muscle between the esophagus and the stomach (lower esophageal sphincter, or LES). Normally, the LES muscle closes after food passes through the esophagus to the stomach. When the LES is weakened or abnormal, it does not close properly, and that allows food and stomach acid to go back up into the esophagus. The LES can be weakened by certain dietary substances, medicines, and medical conditions, including:  Tobacco use.  Pregnancy.  Having a hiatal hernia.  Heavy alcohol use.  Certain foods and beverages, such as coffee, chocolate, onions, and peppermint.  What increases the risk? This condition is more likely to develop in:  People who have an increased body weight.  People who have connective tissue disorders.  People who use NSAID medicines.  What are the signs or symptoms? Symptoms of this condition include:  Heartburn.  Difficult or painful swallowing.  The feeling of having a lump in the throat.  Abitter taste in the mouth.  Bad breath.  Having a large amount of saliva.  Having an upset or bloated stomach.  Belching.  Chest pain.  Shortness of breath or wheezing.  Ongoing (chronic) cough or a night-time cough.  Wearing away of tooth enamel.  Weight loss.  Different conditions can cause chest pain. Make sure to see your health care provider if you experience chest pain. How is this diagnosed? Your health care provider will take a medical history and perform a  physical exam. To determine if you have mild or severe GERD, your health care provider may also monitor how you respond to treatment. You may also have other tests, including:  An endoscopy toexamine your stomach and esophagus with a small camera.  A test thatmeasures the acidity level in your esophagus.  A test thatmeasures how much pressure is on your esophagus.  A barium swallow or modified barium swallow to show the shape, size, and functioning of your esophagus.  How is this treated? The goal of treatment is to help relieve your symptoms and to prevent complications. Treatment for this condition may vary depending on how severe your symptoms are. Your health care provider may recommend:  Changes to your diet.  Medicine.  Surgery.  Follow these instructions at home: Diet  Follow a diet as recommended by your health care provider. This may involve avoiding foods and drinks such as: ? Coffee and tea (with or without caffeine). ? Drinks that containalcohol. ? Energy drinks and sports drinks. ? Carbonated drinks or sodas. ? Chocolate and cocoa. ? Peppermint and mint flavorings. ? Garlic and onions. ? Horseradish. ? Spicy and acidic foods, including peppers, chili powder, curry powder, vinegar, hot sauces, and barbecue sauce. ? Citrus fruit juices and citrus fruits, such as oranges, lemons, and limes. ? Tomato-based foods, such as red sauce, chili, salsa, and pizza with red sauce. ? Fried and fatty foods, such as donuts, french fries, potato chips, and high-fat dressings. ? High-fat meats, such as hot dogs and fatty cuts of red and white meats, such as rib eye steak, sausage,  ham, and bacon. ? High-fat dairy items, such as whole milk, butter, and cream cheese.  Eat small, frequent meals instead of large meals.  Avoid drinking large amounts of liquid with your meals.  Avoid eating meals during the 2-3 hours before bedtime.  Avoid lying down right after you eat.  Do not  exercise right after you eat. General instructions  Pay attention to any changes in your symptoms.  Take over-the-counter and prescription medicines only as told by your health care provider. Do not take aspirin, ibuprofen, or other NSAIDs unless your health care provider told you to do so.  Do not use any tobacco products, including cigarettes, chewing tobacco, and e-cigarettes. If you need help quitting, ask your health care provider.  Wear loose-fitting clothing. Do not wear anything tight around your waist that causes pressure on your abdomen.  Raise (elevate) the head of your bed 6 inches (15cm).  Try to reduce your stress, such as with yoga or meditation. If you need help reducing stress, ask your health care provider.  If you are overweight, reduce your weight to an amount that is healthy for you. Ask your health care provider for guidance about a safe weight loss goal.  Keep all follow-up visits as told by your health care provider. This is important. Contact a health care provider if:  You have new symptoms.  You have unexplained weight loss.  You have difficulty swallowing, or it hurts to swallow.  You have wheezing or a persistent cough.  Your symptoms do not improve with treatment.  You have a hoarse voice. Get help right away if:  You have pain in your arms, neck, jaw, teeth, or back.  You feel sweaty, dizzy, or light-headed.  You have chest pain or shortness of breath.  You vomit and your vomit looks like blood or coffee grounds.  You faint.  Your stool is bloody or black.  You cannot swallow, drink, or eat. This information is not intended to replace advice given to you by your health care provider. Make sure you discuss any questions you have with your health care provider. Document Released: 06/09/2005 Document Revised: 01/28/2016 Document Reviewed: 12/25/2014 Elsevier Interactive Patient Education  2017 [place] for  Gastroesophageal Reflux Disease, Adult When you have gastroesophageal reflux disease (GERD), the foods you eat and your eating habits are very important. Choosing the right foods can help ease your discomfort. What guidelines do I need to follow?  Choose fruits, vegetables, whole grains, and low-fat dairy products.  Choose low-fat meat, fish, and poultry.  Limit fats such as oils, salad dressings, butter, nuts, and avocado.  Keep a food diary. This helps you identify foods that cause symptoms.  Avoid foods that cause symptoms. These may be different for everyone.  Eat small meals often instead of 3 large meals a day.  Eat your meals slowly, in a place where you are relaxed.  Limit fried foods.  Cook foods using methods other than frying.  Avoid drinking alcohol.  Avoid drinking large amounts of liquids with your meals.  Avoid bending over or lying down until 2-3 hours after eating. What foods are not recommended? These are some foods and drinks that may make your symptoms worse: Vegetables Tomatoes. Tomato juice. Tomato and spaghetti sauce. Chili peppers. Onion and garlic. Horseradish. Fruits Oranges, grapefruit, and lemon (fruit and juice). Meats High-fat meats, fish, and poultry. This includes hot dogs, ribs, ham, sausage, salami, and bacon. Dairy Whole milk and chocolate milk.  Sour cream. Cream. Butter. Ice cream. Cream cheese. Drinks Coffee and tea. Bubbly (carbonated) drinks or energy drinks. Condiments Hot sauce. Barbecue sauce. Sweets/Desserts Chocolate and cocoa. Donuts. Peppermint and spearmint. Fats and Oils High-fat foods. This includes [place] fries and potato chips. Other Vinegar. Strong spices. This includes black pepper, white pepper, red pepper, cayenne, curry powder, cloves, ginger, and chili powder. The items listed above may not be a complete list of foods and drinks to avoid. Contact your dietitian for more information. This information is not  intended to replace advice given to you by your health care provider. Make sure you discuss any questions you have with your health care provider. Document Released: 02/29/2012 Document Revised: 02/05/2016 Document Reviewed: 07/04/2013 Elsevier Interactive Patient Education  2017 [place], Adult An allergy is when your body's defense system (immune system) overreacts to an otherwise harmless substance (allergen) that you breathe in or eat or something that touches your skin. When you come into contact with something that you are allergic to, your immune system produces certain proteins (antibodies). These proteins cause cells to release chemicals (histamines) that trigger the symptoms of an allergic reaction. Allergies often affect the nasal passages (allergic rhinitis), eyes (allergic conjunctivitis), skin (atopic dermatitis), and stomach. Allergies can be mild or severe. Allergies cannot spread from person to person (are not contagious). They can develop at any age and may be outgrown. What increases the risk? You may be at greater risk of allergies if other people in your family have allergies. What are the signs or symptoms? Symptoms depend on what type of allergy you have. They may include:  Runny, stuffy nose.  Sneezing.  Itchy mouth, ears, or throat.  Postnasal drip.  Sore throat.  Itchy, red, watery, or puffy eyes.  Skin rash or hives.  Stomach pain.  Vomiting.  Diarrhea.  Bloating.  Wheezing or coughing.  People with a severe allergy to food, medicine, or an insect bite may have a life-threatening allergic reaction (anaphylaxis). Symptoms of anaphylaxis include:  Hives.  Itching.  Flushed face.  Swollen lips, tongue, or mouth.  Tight or swollen throat.  Chest pain or tightness in the chest.  Trouble breathing or shortness of breath.  Rapid heartbeat.  Dizziness or fainting.  Vomiting.  Diarrhea.  Pain in the abdomen.  How is this  diagnosed? This condition is diagnosed based on:  Your symptoms.  Your family and medical history.  A physical exam.  You may need to see a health care provider who specializes in treating allergies (allergist). You may also have tests, including:  Skin tests to see which allergens are causing your symptoms, such as: ? Skin prick test. In this test, your skin is pricked with a tiny needle and exposed to small amounts of possible allergens to see if your skin reacts. ? Intradermal skin test. In this test, a small amount of allergen is injected under your skin to see if your skin reacts. ? Patch test. In this test, a small amount of allergen is placed on your skin and then your skin is covered with a bandage. Your health care provider will check your skin after a couple of days to see if a rash has developed.  Blood tests.  Challenges tests. In this test, you inhale a small amount of allergen by mouth to see if you have an allergic reaction.  You may also be asked to:  Keep a food diary. A food diary is a record of all the foods  and drinks you have in a day and any symptoms you experience.  Practice an elimination diet. An elimination diet involves eliminating specific foods from your diet and then adding them back in one by one to find out if a certain food causes an allergic reaction.  How is this treated? Treatment for allergies depends on your symptoms. Treatment may include:  Cold compresses to soothe itching and swelling.  Eye drops.  Nasal sprays.  Using a saline spray or container (neti pot) to flush out the nose (nasal irrigation). These methods can help clear away mucus and keep the nasal passages moist.  Using a humidifier.  Oral antihistamines or other medicines to block allergic reaction and inflammation.  Skin creams to treat rashes or itching.  Diet changes to eliminate food allergy triggers.  Repeated exposure to tiny amounts of allergens to build up a  tolerance and prevent future allergic reactions (immunotherapy). These include: ? Allergy shots. ? Oral treatment. This involves taking small doses of an allergen under the tongue (sublingual immunotherapy).  Emergency epinephrine injection (auto-injector) in case of an allergic emergency. This is a self-injectable, pre-measured medicine that must be given within the first few minutes of a serious allergic reaction.  Follow these instructions at home:  Avoid known allergens whenever possible.  If you suffer from airborne allergens, wash out your nose daily. You can do this with a saline spray or a neti pot to flush out your nose (nasal irrigation).  Take over-the-counter and prescription medicines only as told by your health care provider.  Keep all follow-up visits as told by your health care provider. This is important.  If you are at risk of a severe allergic reaction (anaphylaxis), keep your auto-injector with you at all times.  If you have ever had anaphylaxis, wear a medical alert bracelet or necklace that states you have a severe allergy. Contact a health care provider if:  Your symptoms do not improve with treatment. Get help right away if:  You have symptoms of anaphylaxis, such as: ? Swollen mouth, tongue, or throat. ? Pain or tightness in your chest. ? Trouble breathing or shortness of breath. ? Dizziness or fainting. ? Severe abdominal pain, vomiting, or diarrhea. This information is not intended to replace advice given to you by your health care provider. Make sure you discuss any questions you have with your health care provider. Document Released: 11/23/2002 Document Revised: 04/29/2016 Document Reviewed: 03/17/2016 Elsevier Interactive Patient Education  [employer].

## 2017-08-16 DIAGNOSIS — R072 Precordial pain: Secondary | ICD-10-CM | POA: Diagnosis not present

## 2017-08-16 DIAGNOSIS — R0602 Shortness of breath: Secondary | ICD-10-CM | POA: Diagnosis not present

## 2017-08-30 DIAGNOSIS — R0602 Shortness of breath: Secondary | ICD-10-CM | POA: Diagnosis not present

## 2017-08-30 DIAGNOSIS — I10 Essential (primary) hypertension: Secondary | ICD-10-CM | POA: Diagnosis not present

## 2017-08-30 DIAGNOSIS — E663 Overweight: Secondary | ICD-10-CM | POA: Diagnosis not present

## 2017-09-01 ENCOUNTER — Inpatient Hospital Stay: Admission: RE | Admit: 2017-09-01 | Payer: BLUE CROSS/BLUE SHIELD | Source: Ambulatory Visit

## 2017-09-05 ENCOUNTER — Telehealth: Payer: Self-pay | Admitting: Gastroenterology

## 2017-09-05 NOTE — Telephone Encounter (Signed)
Patient reports that she is had some cramping and diarrhea last pm.  She reports that she passed something that looks like her intestines "Pale round and bumpy".  She is not having any more pain, rectal bleeding, or diarrhea this am.  She saved the pieces of the material that was in her stool.  She is advised that she should dispose of the material and that if she develops worsening pain, passes blood independent of stool, or has new symptoms she should proceed to the ED.

## 2017-09-05 NOTE — Telephone Encounter (Signed)
Patient states she had abd cramping and diarrhea last night, then this morning she had a BM and states it looks like some intestinal tissue came out. Pt would like a call to discuss concerns.

## 2017-09-07 ENCOUNTER — Other Ambulatory Visit: Payer: BLUE CROSS/BLUE SHIELD

## 2017-09-09 ENCOUNTER — Ambulatory Visit: Payer: BLUE CROSS/BLUE SHIELD

## 2017-09-09 ENCOUNTER — Ambulatory Visit
Admission: RE | Admit: 2017-09-09 | Discharge: 2017-09-09 | Disposition: A | Discharge status: Home / Self Care | Payer: BLUE CROSS/BLUE SHIELD | Source: Ambulatory Visit | Attending: Obstetrics and Gynecology | Admitting: Obstetrics and Gynecology

## 2017-09-09 DIAGNOSIS — N63 Unspecified lump in unspecified breast: Secondary | ICD-10-CM

## 2017-09-09 DIAGNOSIS — R922 Inconclusive mammogram: Secondary | ICD-10-CM | POA: Diagnosis not present

## 2017-09-16 ENCOUNTER — Encounter: Payer: Self-pay | Admitting: Internal Medicine

## 2017-10-06 ENCOUNTER — Ambulatory Visit: Payer: BLUE CROSS/BLUE SHIELD | Admitting: Physician Assistant

## 2017-10-06 ENCOUNTER — Encounter: Payer: Self-pay | Admitting: Internal Medicine

## 2017-10-06 ENCOUNTER — Encounter: Payer: Self-pay | Admitting: Physician Assistant

## 2017-10-06 VITALS — BP 100/70 | HR 72 | Ht 66.0 in | Wt 193.8 lb

## 2017-10-06 DIAGNOSIS — Z86010 Personal history of colonic polyps: Secondary | ICD-10-CM | POA: Diagnosis not present

## 2017-10-06 DIAGNOSIS — K649 Unspecified hemorrhoids: Secondary | ICD-10-CM

## 2017-10-06 MED ORDER — NA SULFATE-K SULFATE-MG SULF 17.5-3.13-1.6 GM/177ML PO SOLN: 1.0000 | Freq: Once | ORAL | 0 refills | Status: AC

## 2017-10-06 NOTE — Progress Notes (Addendum)
Subjective:    Patient ID: [name], female    DOB: [birth year], 57 y.o.   MRN: [id number]  HPI [name] is a pleasant 57 year old African-American female, known to Dr. Rhea Belton from colonoscopy done in 2013 , and was last seen in the office in 2016. She had been seen by her OB/GYN recently and had mentioned symptoms of perianal itching with hemorrhoids and was told to follow up with GI. He says she also received a recall letter for colonoscopy. Today patient says she is not very concerned about her hemorrhoidal symptoms. She has had intermittent mild itching, which she says it has  not been bad enough to use any medication for. She has a remote history of a thrombosed hemorrhoid. She has not been having any rectal bleeding and denies any pain. She does have mild symptoms of constipation, no laxative use and usually has a bowel movement every 2-3 days. She had 1 episode recently of bad diarrhea and cramping after she had eaten out. She says during that episode she passed something unusual in her stool that looked like "a piece of tissue".  Fortunately diarrhea resolved quickly and she has not had any recurrence. She denies any abdominal pain. Last colonoscopy December 2013 with removal of a 4 mm polyp which was a tubular adenoma, noted to have mild diverticulosis and perianal skin tags no internal hemorrhoids. Other medical problems include history of goiter, history of PSVT, and IBS  .Review of Systems Pertinent positive and negative review of systems were noted in the above HPI section.  All other review of systems was otherwise negative.  Outpatient Encounter Medications as of 10/06/2017  Medication Sig  . cholecalciferol (VITAMIN D) 1000 UNITS tablet Take 2,000 Units by mouth every morning.   [name] losartan (COZAAR) 25 MG tablet Take 25 mg by mouth daily.  . metoprolol tartrate (LOPRESSOR) 25 MG tablet Take 25 mg by mouth 2 (two) times daily.  . Omega-3 Fatty Acids (FISH OIL PO) Take 2 capsules  by mouth every other day.   . pantoprazole (PROTONIX) 40 MG tablet Take 1 tablet (40 mg total) by mouth daily. (Patient taking differently: Take 40 mg by mouth daily as needed. )  . PARoxetine (PAXIL-CR) 25 MG 24 hr tablet Take 25 mg by mouth every morning.   . potassium chloride SA (K-DUR,KLOR-CON) 20 MEQ tablet TAKE 1 TABLET(20 MEQ) BY MOUTH TWICE DAILY  . sulfamethoxazole-trimethoprim (BACTRIM DS,SEPTRA DS) 800-160 MG tablet Take 1 tablet by mouth 2 (two) times daily.  . vitamin E 400 UNIT capsule Take 400 Units by mouth daily.  . Na Sulfate-K Sulfate-Mg Sulf 17.5-3.13-1.6 GM/177ML SOLN Take 1 kit by mouth once for 1 dose.   Facility-Administered Encounter Medications as of 10/06/2017  Medication  . nitroGLYCERIN (NITROSTAT) SL tablet 0.3 mg   Allergies  Allergen Reactions  . Ceftriaxone Sodium Itching  . Doxycycline Rash   Patient Active Problem List   Diagnosis Date Noted  . Generalized abdominal pain 05/04/2017  . History of diverticulosis 05/04/2017  . Diarrhea of presumed infectious origin 05/04/2017  . LUQ abdominal pain 12/04/2015  . Belching 12/04/2015  . Abdominal discomfort 04/08/2015  . Hypercalcemia 07/11/2013  . Sweating 07/05/2013  . Other malaise and fatigue 07/05/2013  . Hypocalcemia 07/05/2013  . Palpitations 07/05/2013  . Low blood pressure reading 07/05/2013  . Pes planus (flat feet) 03/29/2013  . Numbness of toes 03/29/2013  . Toe pain 03/29/2013  . History of PSVT (paroxysmal supraventricular tachycardia) 06/24/2012  . Contact dermatitis  06/23/2012  . Hypokalemia 06/23/2012  . Intertrigo 06/23/2012  . Hx of cardiac catheterization   . Positive PPD   . Multinodular goiter 05/12/2012  . Sinus tachycardia 03/09/2012  . SLEEP APNEA 01/19/2008  . GERD 01/18/2008  . DYSPEPSIA 01/18/2008  . CONSTIPATION 01/18/2008  . IRRITABLE BOWEL SYNDROME 01/18/2008   Social History   Socioeconomic History  . Marital status: Divorced    Spouse name: Not on file    . Number of children: 2  . Years of education: 36  . Highest education level: Not on file  Social Needs  . Financial resource strain: Not on file  . Food insecurity - worry: Not on file  . Food insecurity - inability: Not on file  . Transportation needs - medical: Not on file  . Transportation needs - non-medical: Not on file  Occupational History  . Occupation: dialysis Theme park manager: [employer]  Tobacco Use  . Smoking status: Never Smoker  . Smokeless tobacco: Never Used  Substance and Sexual Activity  . Alcohol use: No  . Drug use: No  . Sexual activity: Yes  Other Topics Concern  . Not on file  Social History Narrative   Ms. Comolli is a divorced [place] American female who works as a Teacher, early years/pre who has a number of specialists but no primary care physician. She lived in [place] and in [place] for a number of years her mom is from [place]. 16 years of education went to college at [employer] lives at home with her son who is in his 67s no pets   Neg ets tob etoh hx PA   6 hours of sleep   G2P2    TD2010  colonoscopy 2009   Now running  own business    [facility] of 2        Ms. Sievers's family history includes Colon cancer in her maternal grandfather; Diabetes in her maternal grandfather and maternal grandmother; Heart disease in her father and maternal grandfather; Hypertension in her maternal grandfather.      Objective:    Vitals:   10/06/17 0955  BP: 100/70  Pulse: 72    Physical Exam Well-developed African-American female in no acute distress, pleasant blood pressure 100/70 pulse 72, height 5 foot 6, weight 193, BMI 31.2. HEENT; nontraumatic normocephalic EOMI PERRLA sclera anicteric, Cardiovascular ;regular rate and rhythm with S1-S2 no murmur or gallop, Pulmonary ;clear bilaterally, Abdomen; soft, nontender nondistended bowel sounds are active there is no palpable mass or hepatosplenomegaly, Rectal; exam not done, Neuropsych ;  nonfocal ,mood and affect appropriate.       Assessment & Plan:    #57 year old African-American female with history of adenomatous colon polyps, due for follow-up colonoscopy. #2 diverticulosis #3 mild anal pruritus secondary to hemorrhoidal tags   Plan ; We reviewed management of mild anal pruritus with use of Balneol  lotion, Tucks pads and/or OTC Recticare on a when necessary basis. Patient will be scheduled for colonoscopy with Dr. Rhea Belton. Procedure was discussed in detail with patient including indications, risks and benefits and she is agreeable to proceed.   Tadarius Maland S Ashle Stief PA-C 10/06/2017   Cc: Madelin Headings, MD  Addendum: Reviewed and agree with initial management. Pyrtle, Carie Caddy, MD

## 2017-10-06 NOTE — Patient Instructions (Signed)

## 2017-10-08 ENCOUNTER — Other Ambulatory Visit: Payer: Self-pay | Admitting: Physician Assistant

## 2017-10-13 ENCOUNTER — Ambulatory Visit (AMBULATORY_SURGERY_CENTER): Payer: BLUE CROSS/BLUE SHIELD | Admitting: Internal Medicine

## 2017-10-13 ENCOUNTER — Encounter: Payer: Self-pay | Admitting: Internal Medicine

## 2017-10-13 ENCOUNTER — Other Ambulatory Visit: Payer: Self-pay

## 2017-10-13 VITALS — BP 105/64 | HR 59 | Temp 98.4°F | Resp 11 | Ht 66.0 in | Wt 193.0 lb

## 2017-10-13 DIAGNOSIS — Z86010 Personal history of colonic polyps: Secondary | ICD-10-CM

## 2017-10-13 DIAGNOSIS — D123 Benign neoplasm of transverse colon: Secondary | ICD-10-CM | POA: Diagnosis not present

## 2017-10-13 MED ORDER — SODIUM CHLORIDE 0.9 % IV SOLN
500.0000 mL | Freq: Once | INTRAVENOUS | Status: DC
Start: 2017-10-13 — End: 2019-10-19

## 2017-10-13 NOTE — Patient Instructions (Signed)
Impression/recommendations:  [place] (handout given)  YOU HAD AN ENDOSCOPIC PROCEDURE TODAY AT [facility]:   Refer to the procedure report that was given to you for any specific questions about what was found during the examination.  If the procedure report does not answer your questions, please call your gastroenterologist to clarify.  If you requested that your care partner not be given the details of your procedure findings, then the procedure report has been included in a sealed envelope for you to review at your convenience later.  YOU SHOULD EXPECT: Some feelings of bloating in the abdomen. Passage of more gas than usual.  Walking can help get rid of the air that was put into your GI tract during the procedure and reduce the bloating. If you had a lower endoscopy (such as a colonoscopy or flexible sigmoidoscopy) you may notice spotting of blood in your stool or on the toilet paper. If you underwent a bowel prep for your procedure, you may not have a normal bowel movement for a few days.  Please Note:  You might notice some irritation and congestion in your nose or some drainage.  This is from the oxygen used during your procedure.  There is no need for concern and it should clear up in a day or so.  SYMPTOMS TO REPORT IMMEDIATELY:   Following lower endoscopy (colonoscopy or flexible sigmoidoscopy):  Excessive amounts of blood in the stool  Significant tenderness or worsening of abdominal pains  Swelling of the abdomen that is new, acute  Fever of 100F or higher  For urgent or emergent issues, a gastroenterologist can be reached at any hour by calling [phone].   DIET:  We do recommend a small meal at first, but then you may proceed to your regular diet.  Drink plenty of fluids but you should avoid alcoholic beverages for 24 hours.  ACTIVITY:  You should plan to take it easy for the rest of today and you should NOT DRIVE or use heavy machinery until tomorrow  (because of the sedation medicines used during the test).    FOLLOW UP: Our staff will call the number listed on your records the next business day following your procedure to check on you and address any questions or concerns that you may have regarding the information given to you following your procedure. If we do not reach you, we will leave a message.  However, if you are feeling well and you are not experiencing any problems, there is no need to return our call.  We will assume that you have returned to your regular daily activities without incident.  If any biopsies were taken you will be contacted by phone or by letter within the next 1-3 weeks.  Please call us at [phone] if you have not heard about the biopsies in 3 weeks.    SIGNATURES/CONFIDENTIALITY: You and/or your care partner have signed paperwork which will be entered into your electronic medical record.  These signatures attest to the fact that that the information above on your After Visit Summary has been reviewed and is understood.  Full responsibility of the confidentiality of this discharge information lies with you and/or your care-partner.

## 2017-10-13 NOTE — Op Note (Signed)
[place] Patient Name: [name] Procedure Date: 10/13/2017 3:35 PM MRN: [id number] Endoscopist: Jerene Bears , MD Age: 57 Referring MD:  Date of Birth: 1961/05/31 Gender: Female Account #: [id number] Procedure:                Colonoscopy Indications:              Surveillance: Personal history of adenomatous                            polyps on last colonoscopy 5 years ago Medicines:                Monitored Anesthesia Care Procedure:                Pre-Anesthesia Assessment:                           - Prior to the procedure, a History and Physical                            was performed, and patient medications and                            allergies were reviewed. The patient's tolerance of                            previous anesthesia was also reviewed. The risks                            and benefits of the procedure and the sedation                            options and risks were discussed with the patient.                            All questions were answered, and informed consent                            was obtained. Prior Anticoagulants: The patient has                            taken no previous anticoagulant or antiplatelet                            agents. ASA Grade Assessment: II - A patient with                            mild systemic disease. After reviewing the risks                            and benefits, the patient was deemed in                            satisfactory condition to undergo the procedure.  After obtaining informed consent, the colonoscope                            was passed under direct vision. Throughout the                            procedure, the patient's blood pressure, pulse, and                            oxygen saturations were monitored continuously. The                            Model PCF-H190DL [id number]) scope was introduced                            through the anus and  advanced to the the cecum,                            identified by appendiceal orifice and ileocecal                            valve. The colonoscopy was performed without                            difficulty. The patient tolerated the procedure                            well. The quality of the bowel preparation was                            good. The ileocecal valve, appendiceal orifice, and                            rectum were photographed. Scope In: 3:41:42 PM Scope Out: 4:00:38 PM Scope Withdrawal Time: 0 hours 13 minutes 43 seconds  Total Procedure Duration: 0 hours 18 minutes 56 seconds  Findings:                 The digital rectal exam was normal.                           A 5 mm polyp was found in the splenic flexure. The                            polyp was sessile. The polyp was removed with a                            cold snare. Resection and retrieval were complete.                           The exam was otherwise without abnormality on                            direct and retroflexion views. Complications:  No immediate complications. Estimated Blood Loss:     Estimated blood loss was minimal. Impression:               - One 5 mm polyp at the splenic flexure, removed                            with a cold snare. Resected and retrieved.                           - The examination was otherwise normal on direct                            and retroflexion views. Recommendation:           - Patient has a contact number available for                            emergencies. The signs and symptoms of potential                            delayed complications were discussed with the                            patient. Return to normal activities tomorrow.                            Written discharge instructions were provided to the                            patient.                           - Resume previous diet.                           - Continue present  medications.                           - Await pathology results.                           - Repeat colonoscopy is recommended for                            surveillance in 5 years. The colonoscopy date will                            be determined after pathology results from today's                            exam become available for review. Jerene Bears, MD 10/13/2017 4:05:11 PM This report has been signed electronically.

## 2017-10-13 NOTE — Progress Notes (Signed)
Report to PACU, RN, vss, BBS= Clear.  

## 2017-10-13 NOTE — Progress Notes (Signed)
Called to room to assist during endoscopic procedure.  Patient ID and intended procedure confirmed with present staff. Received instructions for my participation in the procedure from the performing physician.  

## 2017-10-14 ENCOUNTER — Telehealth: Payer: Self-pay

## 2017-10-14 NOTE — Telephone Encounter (Signed)
Attempted to reach pt. With follow up call.  Told pt. To call if she has any questions or concerns.

## 2017-10-14 NOTE — Telephone Encounter (Signed)
Attempted to reach pt. With follow up call following endoscopic procedure 10/13/2017.  LM on pt. Ans. Machine.  Will try to reach pt. Again later today.

## 2017-10-18 ENCOUNTER — Encounter: Payer: Self-pay | Admitting: Internal Medicine

## 2017-10-31 ENCOUNTER — Ambulatory Visit: Payer: BLUE CROSS/BLUE SHIELD | Admitting: Internal Medicine

## 2017-11-02 DIAGNOSIS — N959 Unspecified menopausal and perimenopausal disorder: Secondary | ICD-10-CM | POA: Diagnosis not present

## 2017-11-03 DIAGNOSIS — N951 Menopausal and female climacteric states: Secondary | ICD-10-CM | POA: Diagnosis not present

## 2017-11-08 DIAGNOSIS — N951 Menopausal and female climacteric states: Secondary | ICD-10-CM | POA: Diagnosis not present

## 2017-11-21 DIAGNOSIS — E8941 Symptomatic postprocedural ovarian failure: Secondary | ICD-10-CM | POA: Diagnosis not present

## 2017-11-26 ENCOUNTER — Other Ambulatory Visit: Payer: Self-pay | Admitting: Internal Medicine

## 2017-11-28 NOTE — Telephone Encounter (Signed)
Pt needs to schedule a physical for more refills

## 2017-12-01 DIAGNOSIS — F411 Generalized anxiety disorder: Secondary | ICD-10-CM | POA: Diagnosis not present

## 2017-12-01 DIAGNOSIS — F331 Major depressive disorder, recurrent, moderate: Secondary | ICD-10-CM | POA: Diagnosis not present

## 2017-12-01 DIAGNOSIS — F4310 Post-traumatic stress disorder, unspecified: Secondary | ICD-10-CM | POA: Diagnosis not present

## 2017-12-21 ENCOUNTER — Encounter: Payer: Self-pay | Admitting: Physician Assistant

## 2017-12-21 DIAGNOSIS — R002 Palpitations: Secondary | ICD-10-CM | POA: Diagnosis not present

## 2017-12-21 DIAGNOSIS — R072 Precordial pain: Secondary | ICD-10-CM | POA: Diagnosis not present

## 2017-12-21 DIAGNOSIS — F5101 Primary insomnia: Secondary | ICD-10-CM | POA: Diagnosis not present

## 2017-12-21 DIAGNOSIS — I10 Essential (primary) hypertension: Secondary | ICD-10-CM | POA: Diagnosis not present

## 2017-12-21 DIAGNOSIS — R252 Cramp and spasm: Secondary | ICD-10-CM | POA: Diagnosis not present

## 2017-12-27 DIAGNOSIS — F4310 Post-traumatic stress disorder, unspecified: Secondary | ICD-10-CM | POA: Diagnosis not present

## 2017-12-27 DIAGNOSIS — F331 Major depressive disorder, recurrent, moderate: Secondary | ICD-10-CM | POA: Diagnosis not present

## 2017-12-27 DIAGNOSIS — F411 Generalized anxiety disorder: Secondary | ICD-10-CM | POA: Diagnosis not present

## 2017-12-27 IMAGING — CR DG ABDOMEN ACUTE W/ 1V CHEST
4 series · 4 of 4 positions shown · non-contrast
Comparison: Chest radiograph 01/10/2016

CLINICAL DATA: Abdominal pain. Mid abdominal discomfort and
cramping.

EXAM:
DG ABDOMEN ACUTE W/ 1V CHEST

[w chest pa]
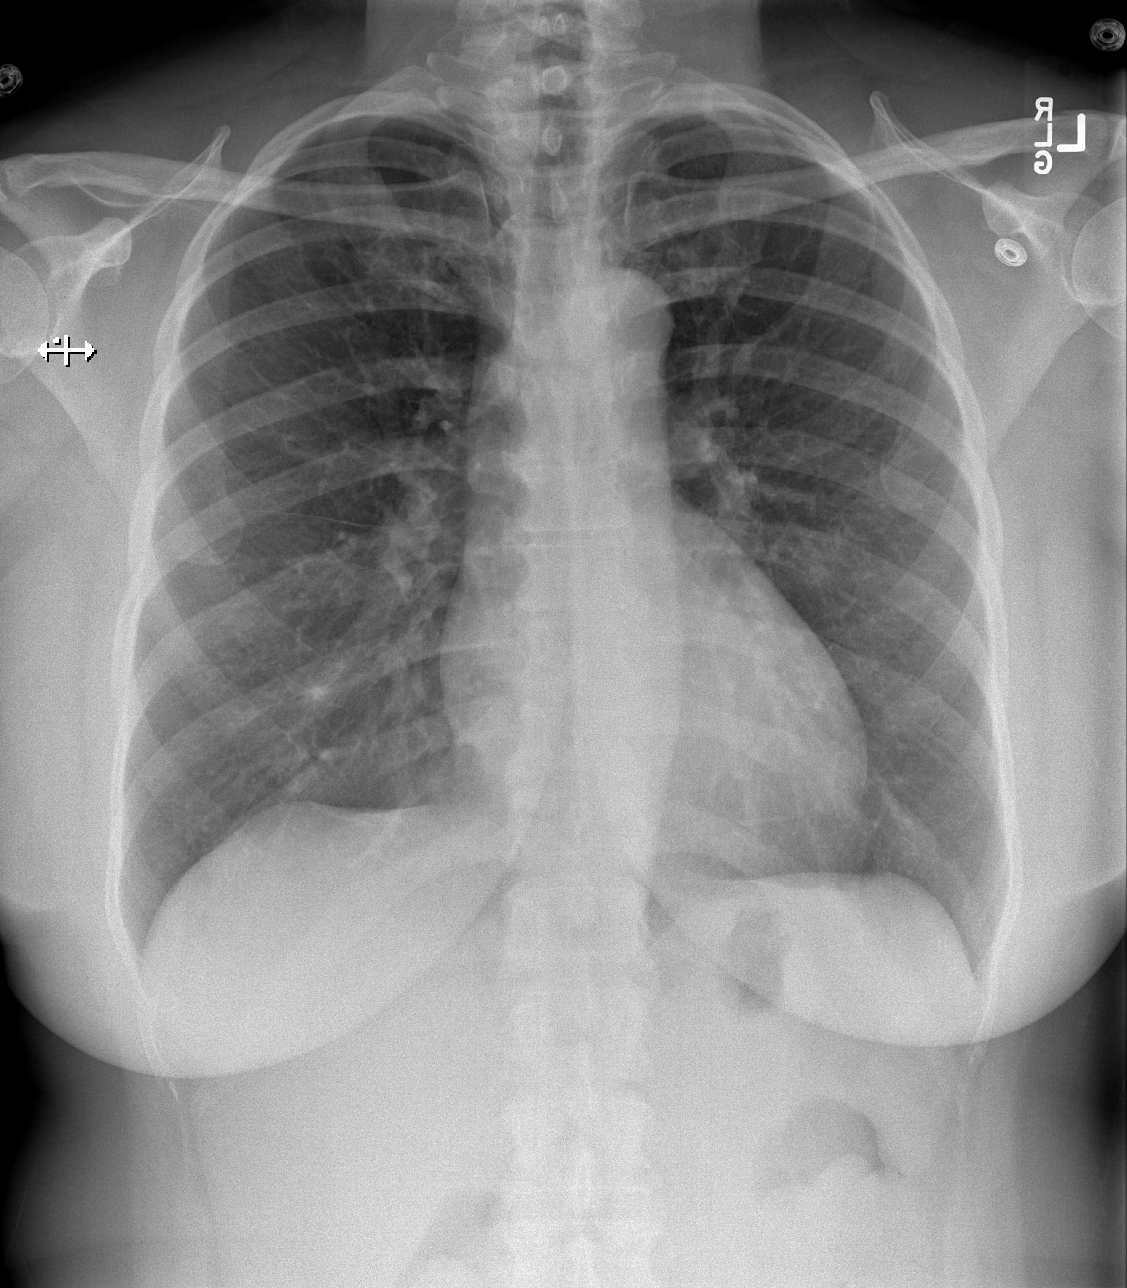

[w abdomen upright]
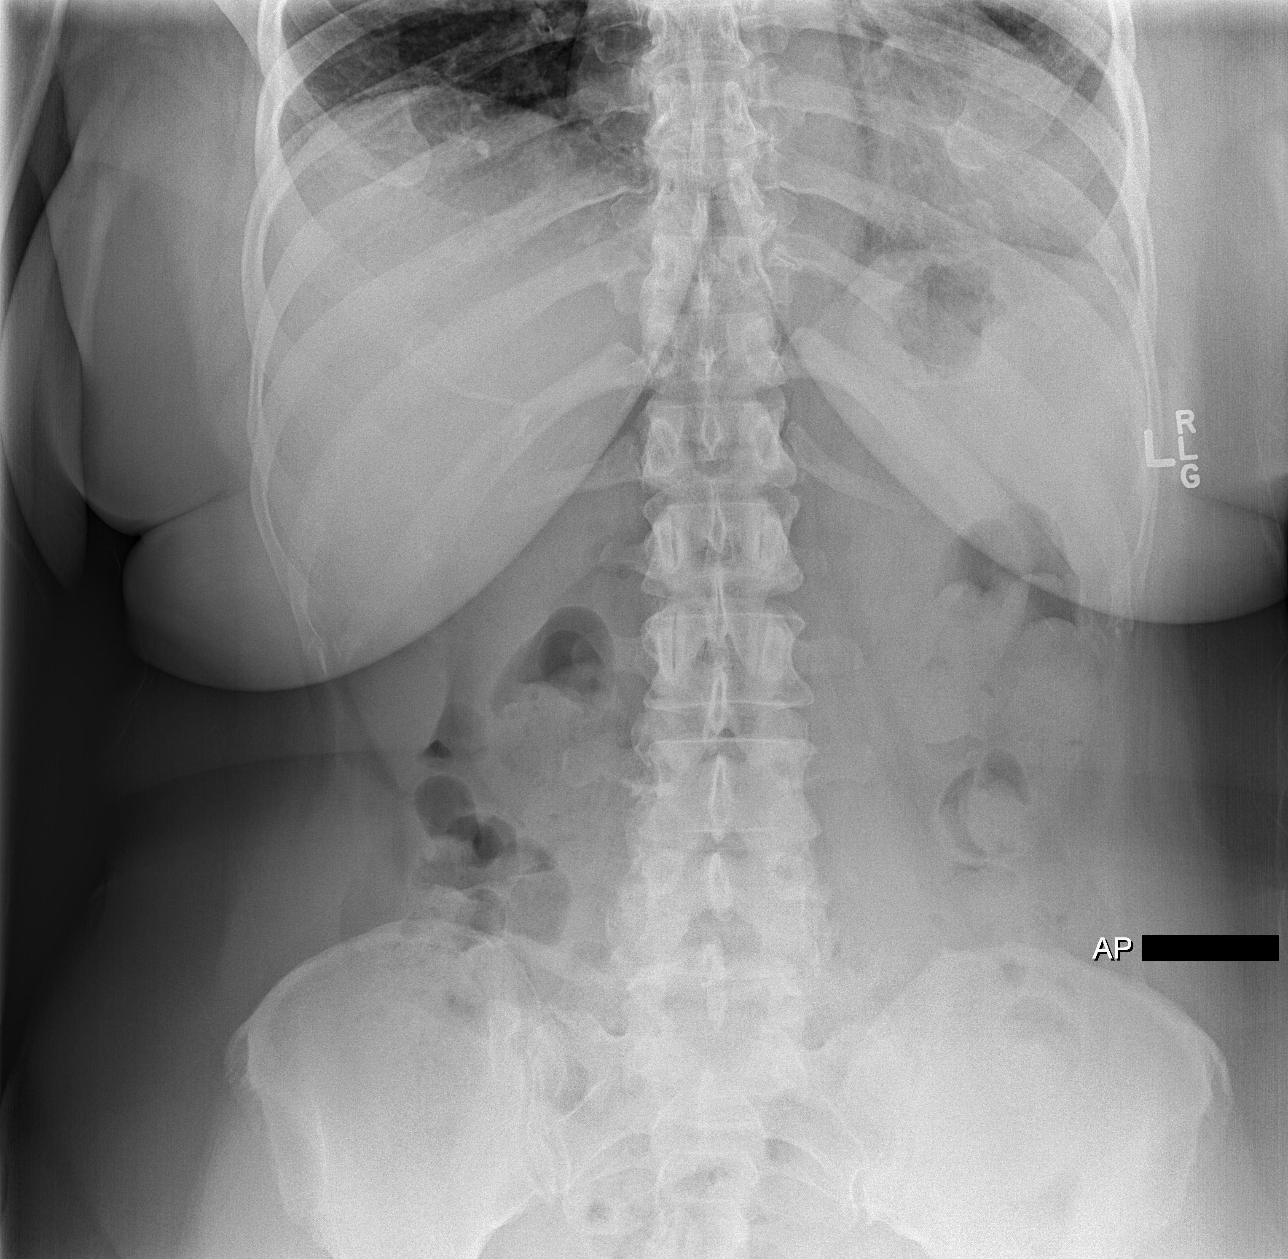

[t abdomen supine (1 of 2)]
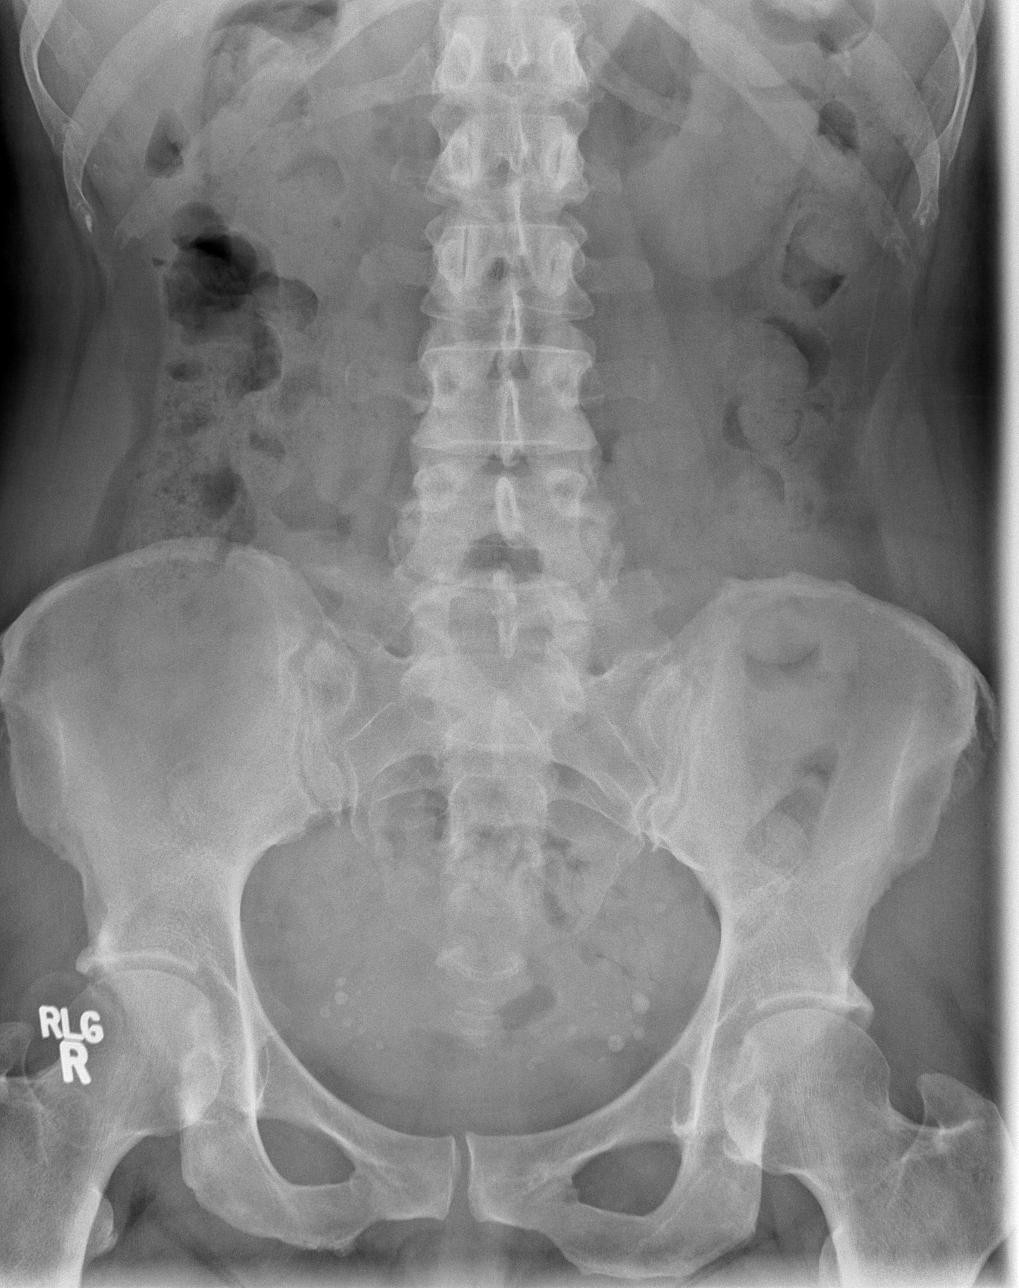

[t abdomen supine (2 of 2)]
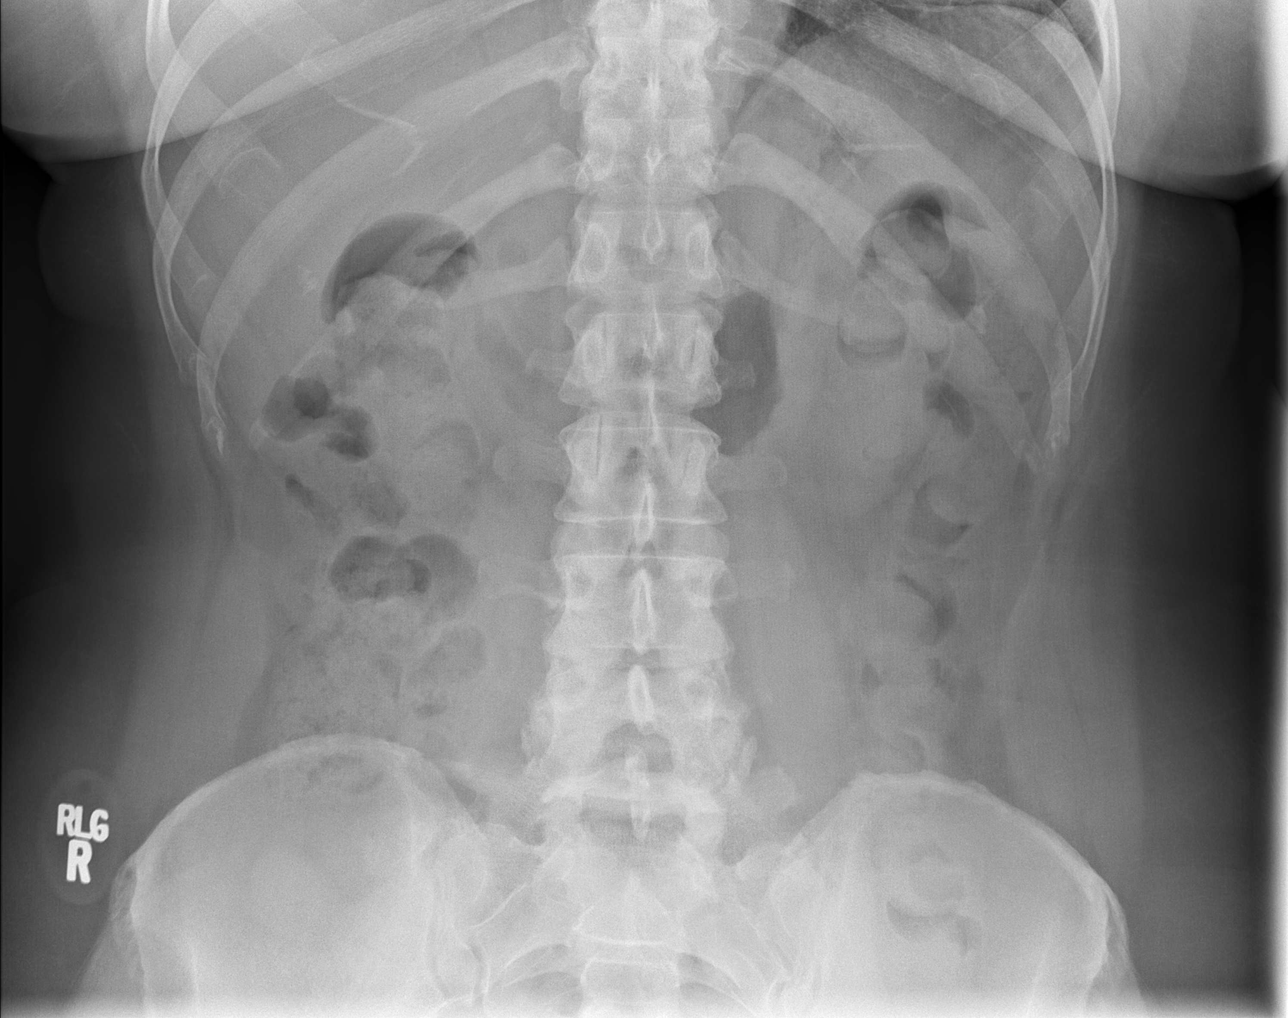

[4 of 4 positions shown; findings below may reference images not displayed]

FINDINGS: The cardiomediastinal contours are normal. The lungs are clear.
There is no free intra-abdominal air. No dilated bowel loops to
suggest obstruction. Small-moderate volume of stool throughout the
colon. No radiopaque calculi. Multiple pelvic phleboliths. No acute
osseous abnormalities are seen.
IMPRESSION: Negative abdominal radiographs.  No acute cardiopulmonary disease.

## 2018-01-07 ENCOUNTER — Other Ambulatory Visit: Payer: Self-pay

## 2018-01-07 ENCOUNTER — Encounter: Payer: Self-pay | Admitting: Emergency Medicine

## 2018-01-07 ENCOUNTER — Ambulatory Visit: Payer: BLUE CROSS/BLUE SHIELD | Admitting: Emergency Medicine

## 2018-01-07 VITALS — BP 116/68 | HR 82 | Temp 98.2°F | Resp 16 | Ht 66.75 in | Wt 199.6 lb

## 2018-01-07 DIAGNOSIS — M7918 Myalgia, other site: Secondary | ICD-10-CM | POA: Diagnosis not present

## 2018-01-07 DIAGNOSIS — M546 Pain in thoracic spine: Secondary | ICD-10-CM | POA: Insufficient documentation

## 2018-01-07 DIAGNOSIS — S39012A Strain of muscle, fascia and tendon of lower back, initial encounter: Secondary | ICD-10-CM | POA: Insufficient documentation

## 2018-01-07 LAB — POCT URINALYSIS DIP (MANUAL ENTRY)
BILIRUBIN UA: NEGATIVE
BILIRUBIN UA: NEGATIVE mg/dL
GLUCOSE UA: NEGATIVE mg/dL
NITRITE UA: NEGATIVE
PH UA: 5.5 (ref 5.0–8.0)
Protein Ur, POC: NEGATIVE mg/dL
RBC UA: NEGATIVE
SPEC GRAV UA: 1.01 (ref 1.010–1.025)
Urobilinogen, UA: 0.2 E.U./dL

## 2018-01-07 MED ORDER — DICLOFENAC SODIUM 75 MG PO TBEC: 75.0000 mg | DELAYED_RELEASE_TABLET | Freq: Two times a day (BID) | ORAL | 0 refills | Status: AC

## 2018-01-07 MED ORDER — CYCLOBENZAPRINE HCL 10 MG PO TABS: 10.0000 mg | ORAL_TABLET | Freq: Three times a day (TID) | ORAL | 0 refills | Status: DC | PRN

## 2018-01-07 NOTE — Progress Notes (Signed)
[name] 57 y.o.   Chief Complaint  Patient presents with  . Back Pain    "traveling from lower to upward back x 3 days    HISTORY OF PRESENT ILLNESS: This is a 57 y.o. female complaining of mid lower back pain started 3 days ago.  Sharp constant pain with no radiation.  Worse with movement.  No associated symptoms.  Back Pain  This is a new problem. The current episode started in the past 7 days. The problem occurs constantly. The problem has been gradually worsening since onset. The pain is present in the lumbar spine and thoracic spine. The quality of the pain is described as aching. The pain does not radiate. The pain is at a severity of 6/10. The pain is moderate. The pain is the same all the time. The symptoms are aggravated by bending, position and twisting. Pertinent negatives include no abdominal pain, bladder incontinence, bowel incontinence, chest pain, dysuria, fever, headaches, leg pain, numbness, paresis, paresthesias, pelvic pain, perianal numbness, tingling or weakness. She has tried nothing for the symptoms.     Prior to Admission medications   Medication Sig Start Date End Date Taking? Authorizing Provider  cholecalciferol (VITAMIN D) 1000 UNITS tablet Take 2,000 Units by mouth every morning.    Yes [provider]  losartan (COZAAR) 25 MG tablet Take 25 mg by mouth daily.   Yes [provider]  metoprolol tartrate (LOPRESSOR) 25 MG tablet Take 25 mg by mouth 2 (two) times daily. 12/29/15  Yes [provider]  Omega-3 Fatty Acids (FISH OIL PO) Take 2 capsules by mouth every other day.    Yes [provider]  PARoxetine (PAXIL-CR) 25 MG 24 hr tablet Take 25 mg by mouth every morning.    Yes [provider]  potassium chloride SA (K-DUR,KLOR-CON) 20 MEQ tablet TAKE 1 TABLET(20 MEQ) BY MOUTH TWICE DAILY 11/28/17  Yes Panosh, Standley Brooking, MD  pantoprazole (PROTONIX) 40 MG tablet Take 1 tablet (40 mg total) by mouth daily. Patient  taking differently: Take 40 mg by mouth daily as needed.  08/15/17   Billie Ruddy, MD  vitamin E 400 UNIT capsule Take 400 Units by mouth daily.    [provider]  mometasone (NASONEX) 50 MCG/ACT nasal spray Place 2 sprays into the nose daily. Patient not taking: Reported on 10/17/2015 09/03/15 10/17/15  Melynda Ripple, MD    Allergies  Allergen Reactions  . Ceftriaxone Sodium Itching  . Doxycycline Rash    Patient Active Problem List   Diagnosis Date Noted  . Generalized abdominal pain 05/04/2017  . History of diverticulosis 05/04/2017  . Diarrhea of presumed infectious origin 05/04/2017  . LUQ abdominal pain 12/04/2015  . Belching 12/04/2015  . Abdominal discomfort 04/08/2015  . Hypercalcemia 07/11/2013  . Sweating 07/05/2013  . Other malaise and fatigue 07/05/2013  . Hypocalcemia 07/05/2013  . Palpitations 07/05/2013  . Low blood pressure reading 07/05/2013  . Pes planus (flat feet) 03/29/2013  . Numbness of toes 03/29/2013  . Toe pain 03/29/2013  . History of PSVT (paroxysmal supraventricular tachycardia) 06/24/2012  . Contact dermatitis 06/23/2012  . Hypokalemia 06/23/2012  . Intertrigo 06/23/2012  . Hx of cardiac catheterization   . Positive PPD   . Multinodular goiter 05/12/2012  . Sinus tachycardia 03/09/2012  . SLEEP APNEA 01/19/2008  . GERD 01/18/2008  . DYSPEPSIA 01/18/2008  . CONSTIPATION 01/18/2008  . IRRITABLE BOWEL SYNDROME 01/18/2008    Past Medical History:  Diagnosis Date  . Anemia   .  Colon polyp 08/28/2012   Tubular adenoma  . Dense breast   . Genital warts   . GERD (gastroesophageal reflux disease)   . Hemorrhoids   . Hx of cardiac catheterization 2009   normal coronary arteries  . Hx of menorrhagia    Novasure  . Hypokalemia   . Hypomagnesemia   . Hypophosphatemia   . Lactose intolerance in adult   . Mitral valve prolapse   . Normal cardiac stress test 03/09/2012   no ischemia  . Positive PPD   . Rapid heart beat     States "irreg heart beat"  . Thyroid disease    Thyroid nodules    Past Surgical History:  Procedure Laterality Date  . CARDIAC CATHETERIZATION     x2 with normal results per pt  . NOVASURE ABLATION    . UPPER GASTROINTESTINAL ENDOSCOPY  May 2013    Social History   Socioeconomic History  . Marital status: Divorced    Spouse name: Not on file  . Number of children: 2  . Years of education: 62  . Highest education level: Not on file  Occupational History  . Occupation: dialysis Engineer, production: [employer]  Social Needs  . Financial resource strain: Not on file  . Food insecurity:    Worry: Not on file    Inability: Not on file  . Transportation needs:    Medical: Not on file    Non-medical: Not on file  Tobacco Use  . Smoking status: Never Smoker  . Smokeless tobacco: Never Used  Substance and Sexual Activity  . Alcohol use: No  . Drug use: No  . Sexual activity: Yes  Lifestyle  . Physical activity:    Days per week: Not on file    Minutes per session: Not on file  . Stress: Not on file  Relationships  . Social connections:    Talks on phone: Not on file    Gets together: Not on file    Attends religious service: Not on file    Active member of club or organization: Not on file    Attends meetings of clubs or organizations: Not on file    Relationship status: Not on file  . Intimate partner violence:    Fear of current or ex partner: Not on file    Emotionally abused: Not on file    Physically abused: Not on file    Forced sexual activity: Not on file  Other Topics Concern  . Not on file  Social History Narrative   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 She lived in [place] and in [place] for a number of years her mom is from [place]. 16 years of education went to college at [employer] lives at home with her son who is in  his 57s no pets   Neg ets tob etoh hx [place]   6 hours of sleep   G2P2    TD2010  colonoscopy 2009   Now running  own business    HH of 2        Family History  Problem Relation Age of Onset  . Heart disease Father        died Mi 5  . Hypertension Maternal Grandfather   . Diabetes Maternal Grandfather   . Heart disease Maternal Grandfather   . Colon cancer Maternal Grandfather   .  Rectal cancer Maternal Grandfather   . Diabetes Maternal Grandmother   . Prostate cancer Neg Hx   . Breast cancer Neg Hx      Review of Systems  Constitutional: Negative.  Negative for chills and fever.  HENT: Negative.  Negative for congestion and sore throat.   Eyes: Negative.   Respiratory: Negative for cough and shortness of breath.   Cardiovascular: Negative.  Negative for chest pain and palpitations.  Gastrointestinal: Negative for abdominal pain, bowel incontinence, nausea and vomiting.  Genitourinary: Negative for bladder incontinence, dysuria, hematuria and pelvic pain.  Musculoskeletal: Positive for back pain.  Skin: Negative.  Negative for rash.  Neurological: Negative for dizziness, tingling, weakness, numbness, headaches and paresthesias.  Endo/Heme/Allergies: Negative.   All other systems reviewed and are negative.   Vitals:   01/07/18 1550  BP: 116/68  Pulse: 82  Resp: 16  Temp: 98.2 F (36.8 C)  SpO2: 99%    Physical Exam  Constitutional: She is oriented to person, place, and time. She appears well-developed and well-nourished.  HENT:  Head: Normocephalic and atraumatic.  Nose: Nose normal.  Mouth/Throat: Oropharynx is clear and moist.  Eyes: Pupils are equal, round, and reactive to light. Conjunctivae and EOM are normal.  Neck: Normal range of motion. Neck supple.  Cardiovascular: Normal rate, regular rhythm and normal heart sounds.  Pulmonary/Chest: Effort normal and breath sounds normal.  Abdominal: Soft. She exhibits no distension. There is no tenderness. There is  no guarding.  Musculoskeletal:       Lumbar back: She exhibits decreased range of motion, tenderness and spasm. She exhibits no bony tenderness and normal pulse.  Neurological: She is alert and oriented to person, place, and time. She displays normal reflexes. No sensory deficit. She exhibits normal muscle tone.  Skin: Capillary refill takes less than 2 seconds.  Psychiatric: She has a normal mood and affect. Her behavior is normal.  Vitals reviewed.    ASSESSMENT & PLAN: Taitum was seen today for back pain.  Diagnoses and all orders for this visit:  Acute bilateral thoracic back pain -     POCT urinalysis dipstick -     Urine Culture  Musculoskeletal pain -     cyclobenzaprine (FLEXERIL) 10 MG tablet; Take 1 tablet (10 mg total) by mouth 3 (three) times daily as needed for muscle spasms. -     diclofenac (VOLTAREN) 75 MG EC tablet; Take 1 tablet (75 mg total) by mouth 2 (two) times daily for 5 days.  Back strain, initial encounter    Patient Instructions       IF you received an x-ray today, you will receive an invoice from [facility]. Please contact [facility] at [phone] with questions or concerns regarding your invoice.   IF you received labwork today, you will receive an invoice from [facility]. Please contact [facility] at [phone] with questions or concerns regarding your invoice.   Our billing staff will not be able to assist you with questions regarding bills from these companies.  You will be contacted with the lab results as soon as they are available. The fastest way to get your results is to activate your My Chart account. Instructions are located on the last page of this paperwork. If you have not heard from [place] regarding the results in 2 weeks, please contact this office.     Back Pain, Adult Back pain is very common. The pain often gets better over time. The cause of back pain is usually not dangerous.  Most people can learn to manage  their back pain on their own. Follow these instructions at home: Watch your back pain for any changes. The following actions may help to lessen any pain you are feeling:  Stay active. Start with short walks on flat ground if you can. Try to walk farther each day.  Exercise regularly as told by your doctor. Exercise helps your back heal faster. It also helps avoid future injury by keeping your muscles strong and flexible.  Do not sit, drive, or stand in one place for more than 30 minutes.  Do not stay in bed. Resting more than 1-2 days can slow down your recovery.  Be careful when you bend or lift an object. Use good form when lifting: ? Bend at your knees. ? Keep the object close to your body. ? Do not twist.  Sleep on a firm mattress. Lie on your side, and bend your knees. If you lie on your back, put a pillow under your knees.  Take medicines only as told by your doctor.  Put ice on the injured area. ? Put ice in a plastic bag. ? Place a towel between your skin and the bag. ? Leave the ice on for 20 minutes, 2-3 times a day for the first 2-3 days. After that, you can switch between ice and heat packs.  Avoid feeling anxious or stressed. Find good ways to deal with stress, such as exercise.  Maintain a healthy weight. Extra weight puts stress on your back.  Contact a doctor if:  You have pain that does not go away with rest or medicine.  You have worsening pain that goes down into your legs or buttocks.  You have pain that does not get better in one week.  You have pain at night.  You lose weight.  You have a fever or chills. Get help right away if:  You cannot control when you poop (bowel movement) or pee (urinate).  Your arms or legs feel weak.  Your arms or legs lose feeling (numbness).  You feel sick to your stomach (nauseous) or throw up (vomit).  You have belly (abdominal) pain.  You feel like you may pass out (faint). This information is not intended to  replace advice given to you by your health care provider. Make sure you discuss any questions you have with your health care provider. Document Released: 02/16/2008 Document Revised: 02/05/2016 Document Reviewed: 01/01/2014 Elsevier Interactive Patient Education  2018 Elsevier Inc.      Agustina Caroli, MD [facility]

## 2018-01-07 NOTE — Patient Instructions (Addendum)
     IF you received an x-ray today, you will receive an invoice from [facility]. Please contact [facility] at [phone] with questions or concerns regarding your invoice.   IF you received labwork today, you will receive an invoice from [facility]. Please contact [facility] at [phone] with questions or concerns regarding your invoice.   Our billing staff will not be able to assist you with questions regarding bills from these companies.  You will be contacted with the lab results as soon as they are available. The fastest way to get your results is to activate your My Chart account. Instructions are located on the last page of this paperwork. If you have not heard from us regarding the results in 2 weeks, please contact this office.      Back Pain, Adult Back pain is very common. The pain often gets better over time. The cause of back pain is usually not dangerous. Most people can learn to manage their back pain on their own. Follow these instructions at home: Watch your back pain for any changes. The following actions may help to lessen any pain you are feeling:  Stay active. Start with short walks on flat ground if you can. Try to walk farther each day.  Exercise regularly as told by your doctor. Exercise helps your back heal faster. It also helps avoid future injury by keeping your muscles strong and flexible.  Do not sit, drive, or stand in one place for more than 30 minutes.  Do not stay in bed. Resting more than 1-2 days can slow down your recovery.  Be careful when you bend or lift an object. Use good form when lifting: ? Bend at your knees. ? Keep the object close to your body. ? Do not twist.  Sleep on a firm mattress. Lie on your side, and bend your knees. If you lie on your back, put a pillow under your knees.  Take medicines only as told by your doctor.  Put ice on the injured area. ? Put ice in a plastic bag. ? Place a towel between your  skin and the bag. ? Leave the ice on for 20 minutes, 2-3 times a day for the first 2-3 days. After that, you can switch between ice and heat packs.  Avoid feeling anxious or stressed. Find good ways to deal with stress, such as exercise.  Maintain a healthy weight. Extra weight puts stress on your back.  Contact a doctor if:  You have pain that does not go away with rest or medicine.  You have worsening pain that goes down into your legs or buttocks.  You have pain that does not get better in one week.  You have pain at night.  You lose weight.  You have a fever or chills. Get help right away if:  You cannot control when you poop (bowel movement) or pee (urinate).  Your arms or legs feel weak.  Your arms or legs lose feeling (numbness).  You feel sick to your stomach (nauseous) or throw up (vomit).  You have belly (abdominal) pain.  You feel like you may pass out (faint). This information is not intended to replace advice given to you by your health care provider. Make sure you discuss any questions you have with your health care provider. Document Released: 02/16/2008 Document Revised: 02/05/2016 Document Reviewed: 01/01/2014 Elsevier Interactive Patient Education  2018 Elsevier Inc.  

## 2018-01-07 NOTE — Progress Notes (Signed)
[name]   

## 2018-01-08 LAB — URINE CULTURE: Organism ID, Bacteria: NO GROWTH

## 2018-01-12 DIAGNOSIS — F331 Major depressive disorder, recurrent, moderate: Secondary | ICD-10-CM | POA: Diagnosis not present

## 2018-01-12 DIAGNOSIS — F411 Generalized anxiety disorder: Secondary | ICD-10-CM | POA: Diagnosis not present

## 2018-01-12 DIAGNOSIS — F4310 Post-traumatic stress disorder, unspecified: Secondary | ICD-10-CM | POA: Diagnosis not present

## 2018-01-13 DIAGNOSIS — E785 Hyperlipidemia, unspecified: Secondary | ICD-10-CM | POA: Diagnosis not present

## 2018-01-13 DIAGNOSIS — Z79899 Other long term (current) drug therapy: Secondary | ICD-10-CM | POA: Diagnosis not present

## 2018-01-13 DIAGNOSIS — E559 Vitamin D deficiency, unspecified: Secondary | ICD-10-CM | POA: Diagnosis not present

## 2018-01-13 DIAGNOSIS — E119 Type 2 diabetes mellitus without complications: Secondary | ICD-10-CM | POA: Diagnosis not present

## 2018-01-26 ENCOUNTER — Other Ambulatory Visit: Payer: Self-pay | Admitting: Internal Medicine

## 2018-02-09 DIAGNOSIS — R252 Cramp and spasm: Secondary | ICD-10-CM | POA: Diagnosis not present

## 2018-02-09 DIAGNOSIS — I10 Essential (primary) hypertension: Secondary | ICD-10-CM | POA: Diagnosis not present

## 2018-02-09 DIAGNOSIS — R072 Precordial pain: Secondary | ICD-10-CM | POA: Diagnosis not present

## 2018-02-09 DIAGNOSIS — F5101 Primary insomnia: Secondary | ICD-10-CM | POA: Diagnosis not present

## 2018-02-09 DIAGNOSIS — R002 Palpitations: Secondary | ICD-10-CM | POA: Diagnosis not present

## 2018-02-10 ENCOUNTER — Emergency Department (HOSPITAL_COMMUNITY): Payer: BLUE CROSS/BLUE SHIELD

## 2018-02-10 ENCOUNTER — Emergency Department (HOSPITAL_COMMUNITY)
Admission: EM | Admit: 2018-02-10 | Discharge: 2018-02-10 | Disposition: A | Discharge status: Home / Self Care | Payer: BLUE CROSS/BLUE SHIELD | Attending: Emergency Medicine | Admitting: Emergency Medicine

## 2018-02-10 ENCOUNTER — Encounter (HOSPITAL_COMMUNITY): Payer: Self-pay | Admitting: Emergency Medicine

## 2018-02-10 ENCOUNTER — Other Ambulatory Visit: Payer: Self-pay

## 2018-02-10 DIAGNOSIS — R0602 Shortness of breath: Secondary | ICD-10-CM | POA: Diagnosis not present

## 2018-02-10 DIAGNOSIS — R079 Chest pain, unspecified: Secondary | ICD-10-CM | POA: Diagnosis not present

## 2018-02-10 DIAGNOSIS — Z79899 Other long term (current) drug therapy: Secondary | ICD-10-CM | POA: Insufficient documentation

## 2018-02-10 DIAGNOSIS — R072 Precordial pain: Secondary | ICD-10-CM | POA: Diagnosis not present

## 2018-02-10 LAB — BASIC METABOLIC PANEL
Anion gap: 8 (ref 5–15)
BUN: 10 mg/dL (ref 6–20)
CALCIUM: 9.8 mg/dL (ref 8.9–10.3)
CHLORIDE: 108 mmol/L (ref 101–111)
CO2: 28 mmol/L (ref 22–32)
CREATININE: 0.85 mg/dL (ref 0.44–1.00)
GFR calc Af Amer: >60 mL/min (ref >60)
Glucose, Bld: 91 mg/dL (ref 65–99)
Potassium: 3.9 mmol/L (ref 3.5–5.1)
Sodium: 144 mmol/L (ref 135–145)

## 2018-02-10 LAB — CBC
HCT: 41.9 % (ref 36.0–46.0)
Hemoglobin: 12.9 g/dL (ref 12.0–15.0)
MCH: 23.1 pg — ABNORMAL LOW (ref 26.0–34.0)
MCHC: 30.8 g/dL (ref 30.0–36.0)
MCV: 75 fL — AB (ref 78.0–100.0)
PLATELETS: 230 10*3/uL (ref 150–400)
RBC: 5.59 MIL/uL — ABNORMAL HIGH (ref 3.87–5.11)
RDW: 14.1 % (ref 11.5–15.5)
WBC: 5.9 10*3/uL (ref 4.0–10.5)

## 2018-02-10 LAB — I-STAT TROPONIN, ED
TROPONIN I, POC: 0 ng/mL (ref 0.00–0.08)
Troponin i, poc: 0 ng/mL (ref 0.00–0.08)

## 2018-02-10 LAB — D-DIMER, QUANTITATIVE: D-Dimer, Quant: 0.35 ug/mL-FEU (ref 0.00–0.50)

## 2018-02-10 NOTE — ED Provider Notes (Signed)
[facility]ENC[facility] DEPARTMENT Provider Note   CSN: [id number] Arrival date & time: 02/10/18  1326     History   Chief Complaint Chief Complaint  Patient presents with  . Chest Pain    HPI [name] is a 57 y.o. female.  Patient is a 57 year old female presenting today with complaint of chest pain that started between 11 and 12:00 while she was at work.  Patient states she was sitting when the pain started and she describes it as a sharp pain that lasted for a minute in her central chest.  She had 3 distinct episodes of the pain over a 5 to 10-minute period of time.  The third episode of pain caused her to have diaphoresis and shortness of breath.  Since that time she has had no further episodes of pain.  However she also notes in the last week or so she has had pain in the right calf and some mild swelling.  She has no recent travel, estrogen use, tobacco abuse or family history of blood clots.  Patient has no prior heart history and had a catheterization in 2009 which was within normal limits.  Upon walking or any exertional type activity today it has not reproduce the pain.  She denies any cough or URI symptoms.  The history is provided by the patient.  Chest Pain   This is a new problem. The current episode started 6 to 12 hours ago. Episode frequency: 3 episodes within a 70min time frame. The problem has been resolved. Associated with: started at rest while she was at work. The pain is present in the substernal region. The pain is at a severity of 6/10. The pain is moderate. The quality of the pain is described as brief and sharp. The pain does not radiate. Associated symptoms include diaphoresis, leg pain and shortness of breath. Pertinent negatives include no abdominal pain, no cough, no exertional chest pressure, no nausea, no sputum production, no syncope, no vomiting and no weakness. She has tried nothing for the symptoms. The treatment provided significant  relief. There are no known risk factors.  Her past medical history is significant for hypertension.  Pertinent negatives for past medical history include no CHF, no diabetes and no DVT.  Procedure history is positive for cardiac catheterization. Past workup comments: cath in 2009 wnl..    Past Medical History:  Diagnosis Date  . Anemia   . Colon polyp 08/28/2012   Tubular adenoma  . Dense breast   . Genital warts   . GERD (gastroesophageal reflux disease)   . Hemorrhoids   . Hx of cardiac catheterization 2009   normal coronary arteries  . Hx of menorrhagia    Novasure  . Hypokalemia   . Hypomagnesemia   . Hypophosphatemia   . Lactose intolerance in adult   . Mitral valve prolapse   . Normal cardiac stress test 03/09/2012   no ischemia  . Positive PPD   . Rapid heart beat    States "irreg heart beat"  . Thyroid disease    Thyroid nodules    Patient Active Problem List   Diagnosis Date Noted  . Acute bilateral thoracic back pain 01/07/2018  . Musculoskeletal pain 01/07/2018  . Strain of back 01/07/2018  . History of diverticulosis 05/04/2017  . Diarrhea of presumed infectious origin 05/04/2017  . Hypercalcemia 07/11/2013  . Hypocalcemia 07/05/2013  . Palpitations 07/05/2013  . Pes planus (flat feet) 03/29/2013  . History of PSVT (paroxysmal supraventricular  tachycardia) 06/24/2012  . Contact dermatitis 06/23/2012  . Hypokalemia 06/23/2012  . Intertrigo 06/23/2012  . Hx of cardiac catheterization   . Positive PPD   . Multinodular goiter 05/12/2012  . SLEEP APNEA 01/19/2008  . GERD 01/18/2008  . DYSPEPSIA 01/18/2008  . IRRITABLE BOWEL SYNDROME 01/18/2008    Past Surgical History:  Procedure Laterality Date  . CARDIAC CATHETERIZATION     x2 with normal results per pt  . NOVASURE ABLATION    . UPPER GASTROINTESTINAL ENDOSCOPY  May 2013     OB History   None      Home Medications    Prior to Admission medications   Medication Sig Start Date End Date  Taking? Authorizing Provider  cholecalciferol (VITAMIN D) 1000 UNITS tablet Take 2,000 Units by mouth every morning.     [provider]  cyclobenzaprine (FLEXERIL) 10 MG tablet Take 1 tablet (10 mg total) by mouth 3 (three) times daily as needed for muscle spasms. 01/07/18   Horald Pollen, MD  losartan (COZAAR) 25 MG tablet Take 25 mg by mouth daily.    [provider]  metoprolol tartrate (LOPRESSOR) 25 MG tablet Take 25 mg by mouth 2 (two) times daily. 12/29/15   [provider]  Omega-3 Fatty Acids (FISH OIL PO) Take 2 capsules by mouth every other day.     [provider]  pantoprazole (PROTONIX) 40 MG tablet Take 1 tablet (40 mg total) by mouth daily. Patient taking differently: Take 40 mg by mouth daily as needed.  08/15/17   Billie Ruddy, MD  PARoxetine (PAXIL-CR) 25 MG 24 hr tablet Take 25 mg by mouth every morning.     [provider]  potassium chloride SA (K-DUR,KLOR-CON) 20 MEQ tablet TAKE 1 TABLET(20 MEQ) BY MOUTH TWICE DAILY 01/27/18   Panosh, Standley Brooking, MD  vitamin E 400 UNIT capsule Take 400 Units by mouth daily.    [provider]  mometasone (NASONEX) 50 MCG/ACT nasal spray Place 2 sprays into the nose daily. Patient not taking: Reported on 10/17/2015 09/03/15 10/17/15  Melynda Ripple, MD    Family History Family History  Problem Relation Age of Onset  . Heart disease Father        died Mi 80  . Hypertension Maternal Grandfather   . Diabetes Maternal Grandfather   . Heart disease Maternal Grandfather   . Colon cancer Maternal Grandfather   . Rectal cancer Maternal Grandfather   . Diabetes Maternal Grandmother   . Prostate cancer Neg Hx   . Breast cancer Neg Hx     Social History Social History   Tobacco Use  . Smoking status: Never Smoker  . Smokeless tobacco: Never Used  Substance Use Topics  . Alcohol use: No  . Drug use: No     Allergies   Ceftriaxone sodium and Doxycycline   Review of  Systems Review of Systems  Constitutional: Positive for diaphoresis.  Respiratory: Positive for shortness of breath. Negative for cough and sputum production.   Cardiovascular: Positive for chest pain. Negative for syncope.  Gastrointestinal: Negative for abdominal pain, nausea and vomiting.  Neurological: Negative for weakness.  All other systems reviewed and are negative.    Physical Exam Updated Vital Signs BP 120/74 (BP Location: Right Arm)   Pulse 70   Temp 98.1 F (36.7 C) (Oral)   Resp 18   LMP 09/18/2016 (Exact Date)   SpO2 100%   Physical Exam  Constitutional: She is oriented to person, place,  and time. She appears well-developed and well-nourished. No distress.  HENT:  Head: Normocephalic and atraumatic.  Mouth/Throat: Oropharynx is clear and moist.  Eyes: Pupils are equal, round, and reactive to light. Conjunctivae and EOM are normal.  Neck: Normal range of motion. Neck supple.  Cardiovascular: Normal rate, regular rhythm and intact distal pulses.  Murmur heard.  Systolic murmur is present with a grade of 2/6. Pulmonary/Chest: Effort normal and breath sounds normal. No respiratory distress. She has no wheezes. She has no rales.  Abdominal: Soft. She exhibits no distension. There is no tenderness. There is no rebound and no guarding.  Musculoskeletal: Normal range of motion. She exhibits no edema.       Right lower leg: She exhibits tenderness.  Mild right calf tenderness  Neurological: She is alert and oriented to person, place, and time.  Skin: Skin is warm and dry. No rash noted. No erythema.  Psychiatric: She has a normal mood and affect. Her behavior is normal.  Nursing note and vitals reviewed.    ED Treatments / Results  Labs (all labs ordered are listed, but only abnormal results are displayed) Labs Reviewed  CBC - Abnormal; Notable for the following components:      Result Value   RBC 5.59 (*)    MCV 75.0 (*)    MCH 23.1 (*)    All other  components within normal limits  BASIC METABOLIC PANEL  D-DIMER, QUANTITATIVE (NOT AT [facility])  I-STAT TROPONIN, ED  I-STAT TROPONIN, ED    EKG EKG Interpretation  Date/Time:  Friday Feb 10 2018 13:38:48 EDT Ventricular Rate:  77 PR Interval:  156 QRS Duration: 66 QT Interval:  376 QTC Calculation: 425 R Axis:   45 Text Interpretation:  Normal sinus rhythm Cannot rule out Anterior infarct , age undetermined No significant change since last tracing Confirmed by Blanchie Dessert [id number]) on 02/10/2018 9:08:31 PM   Radiology Dg Chest 2 View  Result Date: 02/10/2018 CLINICAL DATA:  Chest pain and shortness of breath for 1 day EXAM: CHEST - 2 VIEW COMPARISON:  07/30/2017 FINDINGS: Cardiac shadows within normal limits. The lungs are well aerated bilaterally. No focal infiltrate or sizable effusion is seen. No acute bony abnormality is noted. IMPRESSION: No active cardiopulmonary disease. Electronically Signed   By: Inez Catalina M.D.   On: 02/10/2018 13:58    Procedures Procedures (including critical care time)  Medications Ordered in ED Medications - No data to display   Initial Impression / Assessment and Plan / ED Course  I have reviewed the triage vital signs and the nursing notes.  Pertinent labs & imaging results that were available during my care of the patient were reviewed by me and considered in my medical decision making (see chart for details).     Patient here today with atypical chest pain.  Based on patient's history and lab findings low suspicion for ACS.  She has a heart score of 2 and is fairly low risk.  She had a normal catheterization in 2009.  Patient's EKG without acute changes and delta troponins are within normal limits.  Patient has no sign of electrolyte abnormality today with normal creatinine, sodium and potassium.  Patient's hemoglobin is stable.  Chest x-ray has no acute findings.  Given patient's new right calf pain and now acute chest pain will get a  d-dimer to ensure that there is no risk of clot.  Patient currently is asymptomatic and well-appearing.  Vital signs are reassuring.  Final Clinical Impressions(s) /  ED Diagnoses   Final diagnoses:  None    ED Discharge Orders    None       Blanchie Dessert, MD 02/11/18 2227

## 2018-02-10 NOTE — ED Provider Notes (Signed)
Assumed care from Dr. Maryan Rued at shift change.  See prior notes for full H&P.  Briefly, 57 y.o. F with 3 episodes of brief chest pain within an hour earlier today.  No recurrence since then. Saw cardiologist yesterday for some pain in the right calf with mild swelling.  No hx of DVT or PE.  Work-up thus far reassuring including delta trop.    Plan:  D-dimer pending.  If positive will need CTA.  If d-dimer elevated and CTA negative, can send for OP doppler in the morning.  Results for orders placed or performed during the hospital encounter of 29/56/21  Basic metabolic panel  Result Value Ref Range   Sodium 144 135 - 145 mmol/L   Potassium 3.9 3.5 - 5.1 mmol/L   Chloride 108 101 - 111 mmol/L   CO2 28 22 - 32 mmol/L   Glucose, Bld 91 65 - 99 mg/dL   BUN 10 6 - 20 mg/dL   Creatinine, Ser 0.85 0.44 - 1.00 mg/dL   Calcium 9.8 8.9 - 10.3 mg/dL   GFR calc non Af Amer >60 >60 mL/min   GFR calc Af Amer >60 >60 mL/min   Anion gap 8 5 - 15  CBC  Result Value Ref Range   WBC 5.9 4.0 - 10.5 K/uL   RBC 5.59 (H) 3.87 - 5.11 MIL/uL   Hemoglobin 12.9 12.0 - 15.0 g/dL   HCT 41.9 36.0 - 46.0 %   MCV 75.0 (L) 78.0 - 100.0 fL   MCH 23.1 (L) 26.0 - 34.0 pg   MCHC 30.8 30.0 - 36.0 g/dL   RDW 14.1 11.5 - 15.5 %   Platelets 230 150 - 400 K/uL  D-dimer, quantitative (not at [facility])  Result Value Ref Range   D-Dimer, Quant 0.35 0.00 - 0.50 ug/mL-FEU  I-stat troponin, ED  Result Value Ref Range   Troponin i, poc 0.00 0.00 - 0.08 ng/mL   Comment 3          I-Stat Troponin, ED (not at [facility])  Result Value Ref Range   Troponin i, poc 0.00 0.00 - 0.08 ng/mL   Comment 3           Dg Chest 2 View  Result Date: 02/10/2018 CLINICAL DATA:  Chest pain and shortness of breath for 1 day EXAM: CHEST - 2 VIEW COMPARISON:  07/30/2017 FINDINGS: Cardiac shadows within normal limits. The lungs are well aerated bilaterally. No focal infiltrate or sizable effusion is seen. No acute bony abnormality is noted. IMPRESSION: No  active cardiopulmonary disease. Electronically Signed   By: Inez Catalina M.D.   On: 02/10/2018 13:58    D-dimer negative.  Patient remains pain free here.  Low suspicion for acute DVT/PE at this time.  Feel she is stable for discharge.  Will have her follow-up with her cardiologist, Dr. Doylene Canard.  She understands to return here for any new/acute changes.   Larene Pickett, PA-C 02/10/18 2324    Little, Wenda Overland, MD 02/11/18 [phone]

## 2018-02-10 NOTE — ED Notes (Signed)
Pt reports chest pains earlier in the day along with pain in her right calf.

## 2018-02-10 NOTE — Discharge Instructions (Signed)
Work-up today looked great. Follow-up with Dr. Doylene Canard. Return to the ED for new or worsening symptoms.

## 2018-02-10 NOTE — ED Triage Notes (Signed)
Patient complains of intermittent sharp chest pain from 10 until 1215 today. Denies feeling anything similar before. Alert and oriented and in no apparent distress at this time.

## 2018-02-10 NOTE — ED Notes (Signed)
PT states no chest pain, diaphoresis or sob presently.

## 2018-02-16 ENCOUNTER — Telehealth: Payer: Self-pay | Admitting: Internal Medicine

## 2018-02-16 NOTE — Telephone Encounter (Signed)
Copied from [place] [phone]. Topic: Quick Communication - See Telephone Encounter >> Feb 16, 2018  9:31 AM Bea Graff, NT wrote: CRM for notification. See Telephone encounter for: 02/16/18. Pt would like to speak with Dr. Regis Bill regarding what she should do regarding her lab work readings.

## 2018-02-17 NOTE — Telephone Encounter (Signed)
Please advise Dr Panosh, thanks.   

## 2018-02-20 NOTE — Telephone Encounter (Signed)
Patient is wanting to review labs from ED visit.  ED follow-up scheduled 03/02/18 at 8am. Patient need early AM appt due to her work schedule.  I know you prefer no 30 min appts on Thursdays except well child/acute. Please advise if this appt needs to be changed.

## 2018-02-20 NOTE — Telephone Encounter (Signed)
Haven't seem   patient since  March 2018    So I haven't   Ordered labs    Please clarify    What is needed   OV to discuss  I see that she was in the  ED 5 31  Advise post ed   Visit

## 2018-02-21 DIAGNOSIS — Z01419 Encounter for gynecological examination (general) (routine) without abnormal findings: Secondary | ICD-10-CM | POA: Diagnosis not present

## 2018-02-21 DIAGNOSIS — Z1389 Encounter for screening for other disorder: Secondary | ICD-10-CM | POA: Diagnosis not present

## 2018-02-21 DIAGNOSIS — Z130 Encounter for screening for diseases of the blood and blood-forming organs and certain disorders involving the immune mechanism: Secondary | ICD-10-CM | POA: Diagnosis not present

## 2018-02-21 DIAGNOSIS — Z6831 Body mass index (BMI) 31.0-31.9, adult: Secondary | ICD-10-CM | POA: Diagnosis not present

## 2018-02-21 NOTE — Telephone Encounter (Signed)
Noted. Appt is 8:30am, not 8:00am. Sorry!

## 2018-02-21 NOTE — Telephone Encounter (Signed)
No problem.

## 2018-02-21 NOTE — Telephone Encounter (Signed)
I am ok with  This but need to have  Nursing staff avaiable for the visit since off template

## 2018-03-02 ENCOUNTER — Encounter: Payer: Self-pay | Admitting: Internal Medicine

## 2018-03-02 ENCOUNTER — Ambulatory Visit: Payer: BLUE CROSS/BLUE SHIELD | Admitting: Internal Medicine

## 2018-03-02 VITALS — BP 112/78 | HR 63 | Temp 98.2°F | Wt 195.0 lb

## 2018-03-02 DIAGNOSIS — E042 Nontoxic multinodular goiter: Secondary | ICD-10-CM | POA: Diagnosis not present

## 2018-03-02 DIAGNOSIS — R718 Other abnormality of red blood cells: Secondary | ICD-10-CM | POA: Diagnosis not present

## 2018-03-02 DIAGNOSIS — R413 Other amnesia: Secondary | ICD-10-CM | POA: Diagnosis not present

## 2018-03-02 LAB — CBC WITH DIFFERENTIAL/PLATELET
BASOS PCT: 1 % (ref 0.0–3.0)
Basophils Absolute: 0.1 10*3/uL (ref 0.0–0.1)
Eosinophils Absolute: 0.1 10*3/uL (ref 0.0–0.7)
Eosinophils Relative: 1.6 % (ref 0.0–5.0)
HCT: 41.1 % (ref 36.0–46.0)
Hemoglobin: 13 g/dL (ref 12.0–15.0)
LYMPHS ABS: 1.2 10*3/uL (ref 0.7–4.0)
Lymphocytes Relative: 20.8 % (ref 12.0–46.0)
MCHC: 31.7 g/dL (ref 30.0–36.0)
MCV: 75.5 fl — AB (ref 78.0–100.0)
MONOS PCT: 9.7 % (ref 3.0–12.0)
Monocytes Absolute: 0.6 10*3/uL (ref 0.1–1.0)
NEUTROS ABS: 3.8 10*3/uL (ref 1.4–7.7)
NEUTROS PCT: 66.9 % (ref 43.0–77.0)
PLATELETS: 219 10*3/uL (ref 150.0–400.0)
RBC: 5.45 Mil/uL — ABNORMAL HIGH (ref 3.87–5.11)
RDW: 14.4 % (ref 11.5–15.5)
WBC: 5.7 10*3/uL (ref 4.0–10.5)

## 2018-03-02 LAB — TSH: TSH: 0.63 u[IU]/mL (ref 0.35–4.50)

## 2018-03-02 LAB — T4, FREE: Free T4: 0.69 ng/dL (ref 0.60–1.60)

## 2018-03-02 LAB — T3, FREE: T3, Free: 4 pg/mL (ref 2.3–4.2)

## 2018-03-02 LAB — VITAMIN B12: Vitamin B-12: 422 pg/mL (ref 211–911)

## 2018-03-02 NOTE — Patient Instructions (Addendum)
    Get more sleep to help with   Memory optimization .  Sleep apnea can cuase memory issues also   8 hours best  And down time.   Aerobic exrcise  Helps  Brain function.  Yoga stress reduction .   We will do the  Neuro  Referral   Lab today recheckig iron hg and  b12 thyroid.  Anxiety can cause memory problems also .    Memory Compensation Strategies  1. Use "WARM" strategy.  W= write it down  A= associate it  R= repeat it  M= make a mental note  2.   You can keep a Social worker.  Use a 3-ring notebook with sections for the following: calendar, important names and phone numbers,  medications, doctors' names/phone numbers, lists/reminders, and a section to journal what you did  each day.   3.    Use a calendar to write appointments down.  4.    Write yourself a schedule for the day.  This can be placed on the calendar or in a separate section of the Memory Notebook.  Keeping a  regular schedule can help memory.  5.    Use medication organizer with sections for each day or morning/evening pills.  You may need help loading it  6.    Keep a basket, or pegboard by the door.  Place items that you need to take out with you in the basket or on the pegboard.  You may also want to  include a message board for reminders.  7.    Use sticky notes.  Place sticky notes with reminders in a place where the task is performed.  For example: " turn off the  stove" placed by the stove, "lock the door" placed on the door at eye level, " take your medications" on  the bathroom mirror or by the place where you normally take your medications.  8.    Use alarms/timers.  Use while cooking to remind yourself to check on food or as a reminder to take your medicine, or as a  reminder to make a call, or as a reminder to perform another task, etc.

## 2018-03-02 NOTE — Progress Notes (Signed)
Chief Complaint  Patient presents with  . Hospitalization Follow-up    HPI: [name] 57 y.o. come in for fu ed visit  May 43for chest pain and to review labs  has GERD; DYSPEPSIA; IRRITABLE BOWEL SYNDROME; SLEEP APNEA; Multinodular goiter; Contact dermatitis; Hypokalemia; Intertrigo; Hx of cardiac catheterization; Positive PPD; History of PSVT (paroxysmal supraventricular tachycardia); Pes planus (flat feet); Hypocalcemia; Palpitations; Hypercalcemia; History of diverticulosis; Diarrhea of presumed infectious origin; Acute bilateral thoracic back pain; Musculoskeletal pain; and Strain of back on their problem list. She sees cardiologist  Dr Doylene Canard ? And had leg pain and then chest pain  Low sus[piciaon for dvt Was noted to have a murmur and leg tenderness  She noted that her mcv is low and concerned about  If this could cause coldness and memory concerns     Doing ok other wise?  googled  Blood issues  googles and   Concern about short  term memory  Concentration  And ? If related .  No bleeding   No syncope she is very busy own business and sleep 6? Hours or so  Feels memory is a problem  No falling neuropathy balance issues .  Saw neuro years ago but not recently  Some axniety stress  Ong oing on paxil.   owrks many hours  ROS: See pertinent positives and negatives per HPI. Hx of nl cath in 2009  Patient here today with atypical chest pain.  Based on patient's history and lab findings low suspicion for ACS.  She has a heart score of 2 and is fairly low risk.  She had a normal catheterization in 2009.  Patient's EKG without acute changes and delta troponins are within normal limits.  Patient has no sign of electrolyte abnormality today with normal creatinine, sodium and potassium.  Patient's hemoglobin is stable.  Chest x-ray has no acute findings.  Given patient's new right calf pain and now acute chest pain will get a d-dimer to ensure that there is no risk of clot.  Patient currently  is asymptomatic and well-appearing.  Vital signs are reassuring.  Past Medical History:  Diagnosis Date  . Anemia   . Colon polyp 08/28/2012   Tubular adenoma  . Dense breast   . Genital warts   . GERD (gastroesophageal reflux disease)   . Hemorrhoids   . Hx of cardiac catheterization 2009   normal coronary arteries  . Hx of menorrhagia    Novasure  . Hypokalemia   . Hypomagnesemia   . Hypophosphatemia   . Lactose intolerance in adult   . Mitral valve prolapse   . Normal cardiac stress test 03/09/2012   no ischemia  . Positive PPD   . Rapid heart beat    States "irreg heart beat"  . Thyroid disease    Thyroid nodules    Family History  Problem Relation Age of Onset  . Heart disease Father        died Mi 64  . Hypertension Maternal Grandfather   . Diabetes Maternal Grandfather   . Heart disease Maternal Grandfather   . Colon cancer Maternal Grandfather   . Rectal cancer Maternal Grandfather   . Diabetes Maternal Grandmother   . Prostate cancer Neg Hx   . Breast cancer Neg Hx     Social History   Socioeconomic History  . Marital status: Divorced    Spouse name: Not on file  . Number of children: 2  . Years of education: 79  . Highest  education level: Not on file  Occupational History  . Occupation: dialysis Engineer, production: [employer]  Social Needs  . Financial resource strain: Not on file  . Food insecurity:    Worry: Not on file    Inability: Not on file  . Transportation needs:    Medical: Not on file    Non-medical: Not on file  Tobacco Use  . Smoking status: Never Smoker  . Smokeless tobacco: Never Used  Substance and Sexual Activity  . Alcohol use: No  . Drug use: No  . Sexual activity: Yes  Lifestyle  . Physical activity:    Days per week: Not on file    Minutes per session: Not on file  . Stress: Not on file  Relationships  . Social connections:    Talks on phone: Not on file    Gets together: Not on file    Attends  religious service: Not on file    Active member of club or organization: Not on file    Attends meetings of clubs or organizations: Not on file    Relationship status: Not on file  Other Topics Concern  . 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 She lived in [place] and in [place] for a number of years her mom is from [place]. 16 years of education went to college at [employer] lives at home with her son who is in his 40s no pets   Neg ets tob etoh hx PA   6 hours of sleep   G2P2    TD2010  colonoscopy 2009   Now running  own business    [facility]        Outpatient Medications Prior to Visit  Medication Sig Dispense Refill  . cholecalciferol (VITAMIN D) 1000 UNITS tablet Take 1,000 Units by mouth daily.     [name] losartan (COZAAR) 25 MG tablet Take 25 mg by mouth daily.    . metoprolol tartrate (LOPRESSOR) 25 MG tablet Take 25 mg by mouth 2 (two) times daily.  6  . pantoprazole (PROTONIX) 40 MG tablet Take 1 tablet (40 mg total) by mouth daily. 90 tablet 0  . PARoxetine (PAXIL-CR) 25 MG 24 hr tablet Take 25 mg by mouth daily.     . potassium chloride SA (K-DUR,KLOR-CON) 20 MEQ tablet TAKE 1 TABLET(20 MEQ) BY MOUTH TWICE DAILY 60 tablet 0  . cyclobenzaprine (FLEXERIL) 10 MG tablet Take 1 tablet (10 mg total) by mouth 3 (three) times daily as needed for muscle spasms. (Patient not taking: Reported on 02/10/2018) 30 tablet 0  . Omega-3 Fatty Acids (FISH OIL PO) Take 2 capsules by mouth every other day.      Facility-Administered Medications Prior to Visit  Medication Dose Route Frequency Provider Last Rate Last Dose  . 0.9 %  sodium chloride infusion  500 mL Intravenous Once Pyrtle, Lajuan Lines, MD      . nitroGLYCERIN (NITROSTAT) SL tablet 0.3 mg  0.3 mg Sublingual Q5 min PRN Philis Fendt L, PA-C   0.3 mg at 11/22/15 0951      EXAM:  BP 112/78   Pulse 63   Temp 98.2 F (36.8 C)   Wt 195 lb (88.5 kg)   LMP 09/18/2016 (Exact Date)   BMI 30.77 kg/m   Body mass index  is 30.77 kg/m.  GENERAL: vitals reviewed and listed above, alert, oriented, appears well hydrated and in no acute  No ntoxic  Active verbal  HEENT: atraumatic, conjunctiva  clear, no obvious abnormalities on inspection of external nose and ears NECK: no obvious masses on inspection palpation  LUNGS: clear to auscultation bilaterally, no wheezes, rales or rhonchi, good air movement CV: HRRR, no clubbing cyanosis or  peripheral edema nl cap refill  Some vv no numbness or acute finding  Abdomen:  Sof,t normal bowel sounds without hepatosplenomegaly, no guarding rebound or masses no CVA tenderness  MS: moves all extremities without noticeable focal  abnormality PSYCH: pleasant and cooperative, no obvious depression  ? Mild anxiety  Had to take phone call in visit for her work . ( runs a business)  Neuro seems non focal  No gross abnormality  Lab Results  Component Value Date   WBC 5.9 02/10/2018   HGB 12.9 02/10/2018   HCT 41.9 02/10/2018   PLT 230 02/10/2018   GLUCOSE 91 02/10/2018   CHOL 112 11/22/2016   TRIG 101.0 11/22/2016   HDL 41.80 11/22/2016   LDLCALC 50 11/22/2016   ALT 23 05/04/2017   AST 24 05/04/2017   NA 144 02/10/2018   K 3.9 02/10/2018   CL 108 02/10/2018   CREATININE 0.85 02/10/2018   BUN 10 02/10/2018   CO2 28 02/10/2018   TSH 1.080 10/30/2016   INR 0.95 03/10/2012   HGBA1C 5.6 10/30/2016   BP Readings from Last 3 Encounters:  03/02/18 112/78  02/10/18 120/69  01/07/18 116/68    ASSESSMENT AND PLAN:  Discussed the following assessment and plan:  Complaints of memory disturbance - Plan: CBC with Differential/Platelet, Vitamin B12, TSH, T4, free, T3, free, Hemoglobinopathy evaluation, Iron, TIBC and Ferritin Panel, CANCELED: Iron, TIBC and Ferritin Panel, CANCELED: Iron and TIBC  Multinodular goiter -  Plan: CBC with Differential/Platelet, Vitamin B12, TSH, T4, free, T3, free, Hemoglobinopathy evaluation, CANCELED: Iron, TIBC and Ferritin Panel  Low mean corpuscular volume (MCV) - Plan: CBC with Differential/Platelet, Vitamin B12, TSH, T4, free, T3, free, Hemoglobinopathy evaluation, CANCELED: Iron, TIBC and Ferritin Panel Concern about mcv but suspect this may be a ongoing findings  Poss hemoglobin  opathy  As iron levels  checked in past  Will re review   Of more inprot to her is the memory issues that could be sleep, anxiety  Business age and other factors   . options discussed and plan neuro evaluation   Asks for [facility]  Neuro ( remote hx of ov there)   She has a number of specialists and uses  [facility]/ urgent cares as needed   Cause close to her  Work  So not seen in over a year .  3-4 in past year  -Patient advised to return or notify health care team  if  new concerns arise.  Patient Instructions     Get more sleep to help with   Memory optimization .  Sleep apnea can cuase memory issues also   8 hours best  And down time.   Aerobic exrcise  Helps  Brain function.  Yoga stress reduction .   We will do the  Neuro  Referral   Lab today recheckig iron hg and  b12 thyroid.  Anxiety can cause memory problems also .    Memory Compensation Strategies  1. Use "WARM" strategy.  W= write it down  A= associate it  R= repeat it  M= make a mental  note  2.   You can keep a Social worker.  Use a 3-ring notebook with sections for the following: calendar, important names and phone numbers,  medications, doctors' names/phone numbers, lists/reminders, and a section to journal what you did  each day.   3.    Use a calendar to write appointments down.  4.    Write yourself a schedule for the day.  This can be placed on the calendar or in a separate section of the Memory Notebook.  Keeping a  regular schedule can help memory.  5.    Use medication organizer with sections for each day or  morning/evening pills.  You may need help loading it  6.    Keep a basket, or pegboard by the door.  Place items that you need to take out with you in the basket or on the pegboard.  You may also want to  include a message board for reminders.  7.    Use sticky notes.  Place sticky notes with reminders in a place where the task is performed.  For example: " turn off the  stove" placed by the stove, "lock the door" placed on the door at eye level, " take your medications" on  the bathroom mirror or by the place where you normally take your medications.  8.    Use alarms/timers.  Use while cooking to remind yourself to check on food or as a reminder to take your medicine, or as a  reminder to make a call, or as a reminder to perform another task, etc.     Standley Brooking. Gid Schoffstall M.D.

## 2018-03-03 LAB — IRON,TIBC AND FERRITIN PANEL
%SAT: 40 % (calc) (ref 16–45)
Ferritin: 175 ng/mL (ref 16–232)
Iron: 104 ug/dL (ref 45–160)
TIBC: 263 mcg/dL (calc) (ref 250–450)

## 2018-03-09 ENCOUNTER — Other Ambulatory Visit: Payer: Self-pay | Admitting: Internal Medicine

## 2018-03-22 DIAGNOSIS — F5101 Primary insomnia: Secondary | ICD-10-CM | POA: Diagnosis not present

## 2018-03-22 DIAGNOSIS — M545 Low back pain: Secondary | ICD-10-CM | POA: Diagnosis not present

## 2018-03-22 DIAGNOSIS — R072 Precordial pain: Secondary | ICD-10-CM | POA: Diagnosis not present

## 2018-03-22 DIAGNOSIS — I10 Essential (primary) hypertension: Secondary | ICD-10-CM | POA: Diagnosis not present

## 2018-03-22 DIAGNOSIS — R002 Palpitations: Secondary | ICD-10-CM | POA: Diagnosis not present

## 2018-03-28 ENCOUNTER — Other Ambulatory Visit: Payer: Self-pay | Admitting: Family Medicine

## 2018-03-28 ENCOUNTER — Other Ambulatory Visit: Payer: Self-pay | Admitting: Radiology

## 2018-03-28 DIAGNOSIS — R718 Other abnormality of red blood cells: Secondary | ICD-10-CM

## 2018-03-28 NOTE — Addendum Note (Signed)
Addended by: Honor Loh L on: 03/28/2018 05:00 PM   Modules accepted: Orders

## 2018-03-31 NOTE — Progress Notes (Deleted)
No chief complaint on file.   HPI: Patient  [name]  57 y.o. comes in today for Preventive Health Care visit   Health Maintenance  Topic Date Due  . Hepatitis C Screening  [birth year]  . HIV Screening  06[birth year]1974  . PAP SMEAR  09/13/2016  . TETANUS/TDAP  09/13/2017  . INFLUENZA VACCINE  04/13/2018  . MAMMOGRAM  09/10/2019  . COLONOSCOPY  10/13/2022   Health Maintenance Review LIFESTYLE:  Exercise:   Tobacco/ETS: Alcohol:  Sugar beverages: Sleep: Drug use: no HH of  Work:    ROS:  GEN/ HEENT: No fever, significant weight changes sweats headaches vision problems hearing changes, CV/ PULM; No chest pain shortness of breath cough, syncope,edema  change in exercise tolerance. GI /GU: No adominal pain, vomiting, change in bowel habits. No blood in the stool. No significant GU symptoms. SKIN/HEME: ,no acute skin rashes suspicious lesions or bleeding. No lymphadenopathy, nodules, masses.  NEURO/ PSYCH:  No neurologic signs such as weakness numbness. No depression anxiety. IMM/ Allergy: No unusual infections.  Allergy .   REST of 12 system review negative except as per HPI   Past Medical History:  Diagnosis Date  . Anemia   . Colon polyp 08/28/2012   Tubular adenoma  . Dense breast   . Genital warts   . GERD (gastroesophageal reflux disease)   . Hemorrhoids   . Hx of cardiac catheterization 2009   normal coronary arteries  . Hx of menorrhagia    Novasure  . Hypokalemia   . Hypomagnesemia   . Hypophosphatemia   . Lactose intolerance in adult   . Mitral valve prolapse   . Normal cardiac stress test 03/09/2012   no ischemia  . Positive PPD   . Rapid heart beat    States "irreg heart beat"  . Thyroid disease    Thyroid nodules    Past Surgical History:  Procedure Laterality Date  . CARDIAC CATHETERIZATION     x2 with normal results per pt  . NOVASURE ABLATION    . UPPER GASTROINTESTINAL ENDOSCOPY  May 2013    Family History  Problem Relation Age  of Onset  . Heart disease Father        died Mi 41  . Hypertension Maternal Grandfather   . Diabetes Maternal Grandfather   . Heart disease Maternal Grandfather   . Colon cancer Maternal Grandfather   . Rectal cancer Maternal Grandfather   . Diabetes Maternal Grandmother   . Prostate cancer Neg Hx   . Breast cancer Neg Hx     Social History   Socioeconomic History  . Marital status: Divorced    Spouse name: Not on file  . Number of children: 2  . Years of education: 50  . Highest education level: Not on file  Occupational History  . Occupation: dialysis Engineer, production: [employer]  Social Needs  . Financial resource strain: Not on file  . Food insecurity:    Worry: Not on file    Inability: Not on file  . Transportation needs:    Medical: Not on file    Non-medical: Not on file  Tobacco Use  . Smoking status: Never Smoker  . Smokeless tobacco: Never Used  Substance and Sexual Activity  . Alcohol use: No  . Drug use: No  . Sexual activity: Yes  Lifestyle  . Physical activity:    Days per week: Not on file    Minutes per session: Not on file  .  Stress: Not on file  Relationships  . Social connections:    Talks on phone: Not on file    Gets together: Not on file    Attends religious service: Not on file    Active member of club or organization: Not on file    Attends meetings of clubs or organizations: Not on file    Relationship status: Not on file  Other Topics Concern  . Not on file  Social History Narrative   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 She lived in [place] and in [place] for a number of years her mom is from [place]. 16 years of education went to college at [employer] lives at home with her son who is in his 81s no pets   Neg ets tob etoh hx PA   6 hours of sleep   G2P2    TD2010  colonoscopy 2009   Now  running  own business    [employer] [facility]        Outpatient Medications Prior to Visit  Medication Sig Dispense Refill  . cholecalciferol (VITAMIN D) 1000 UNITS tablet Take 1,000 Units by mouth daily.     . cyclobenzaprine (FLEXERIL) 10 MG tablet Take 1 tablet (10 mg total) by mouth 3 (three) times daily as needed for muscle spasms. (Patient not taking: Reported on 02/10/2018) 30 tablet 0  . losartan (COZAAR) 25 MG tablet Take 25 mg by mouth daily.    . metoprolol tartrate (LOPRESSOR) 25 MG tablet Take 25 mg by mouth 2 (two) times daily.  6  . Omega-3 Fatty Acids (FISH OIL PO) Take 2 capsules by mouth every other day.     . pantoprazole (PROTONIX) 40 MG tablet Take 1 tablet (40 mg total) by mouth daily. 90 tablet 0  . PARoxetine (PAXIL-CR) 25 MG 24 hr tablet Take 25 mg by mouth daily.     . potassium chloride SA (K-DUR,KLOR-CON) 20 MEQ tablet TAKE 1 TABLET BY MOUTH TWICE DAILY 60 tablet 3   Facility-Administered Medications Prior to Visit  Medication Dose Route Frequency Provider Last Rate Last Dose  . 0.9 %  sodium chloride infusion  500 mL Intravenous Once Pyrtle, Lajuan Lines, MD      . nitroGLYCERIN (NITROSTAT) SL tablet 0.3 mg  0.3 mg Sublingual Q5 min PRN Tereasa Coop, PA-C   0.3 mg at 11/22/15 0951     EXAM:  LMP 09/18/2016 (Exact Date)   There is no height or weight on file to calculate BMI. Wt Readings from Last 3 Encounters:  03/02/18 195 lb (88.5 kg)  01/07/18 199 lb 9.6 oz (90.5 kg)  10/13/17 193 lb (87.5 kg)    Physical Exam: Vital signs reviewed JYN:WGNF is a well-developed well-nourished alert cooperative    who appearsr stated age in no acute distress.  HEENT: normocephalic atraumatic , Eyes: PERRL EOM's full, conjunctiva clear, Nares: paten,t no deformity discharge or tenderness., Ears: no deformity EAC's clear TMs with normal landmarks. Mouth: clear OP, no lesions, edema.  Moist mucous membranes. Dentition in adequate repair. NECK: supple without masses, thyromegaly or  bruits. CHEST/PULM:  Clear to auscultation and percussion breath sounds equal no wheeze , rales or rhonchi. No chest wall deformities or tenderness. Breast: normal by inspection . No dimpling, discharge, masses, tenderness or discharge . CV: PMI is nondisplaced, S1 S2 no gallops, murmurs, rubs. Peripheral pulses are  full without delay.No JVD .  ABDOMEN: Bowel sounds normal nontender  No guard or rebound, no hepato splenomegal no CVA tenderness.  No hernia. Extremtities:  No clubbing cyanosis or edema, no acute joint swelling or redness no focal atrophy NEURO:  Oriented x3, cranial nerves 3-12 appear to be intact, no obvious focal weakness,gait within normal limits no abnormal reflexes or asymmetrical SKIN: No acute rashes normal turgor, color, no bruising or petechiae. PSYCH: Oriented, good eye contact, no obvious depression anxiety, cognition and judgment appear normal. LN: no cervical axillary inguinal adenopathy  Lab Results  Component Value Date   WBC 5.7 03/02/2018   HGB 13.0 03/02/2018   HCT 41.1 03/02/2018   PLT 219.0 03/02/2018   GLUCOSE 91 02/10/2018   CHOL 112 11/22/2016   TRIG 101.0 11/22/2016   HDL 41.80 11/22/2016   LDLCALC 50 11/22/2016   ALT 23 05/04/2017   AST 24 05/04/2017   NA 144 02/10/2018   K 3.9 02/10/2018   CL 108 02/10/2018   CREATININE 0.85 02/10/2018   BUN 10 02/10/2018   CO2 28 02/10/2018   TSH 0.63 03/02/2018   INR 0.95 03/10/2012   HGBA1C 5.6 10/30/2016    BP Readings from Last 3 Encounters:  03/02/18 112/78  02/10/18 120/69  01/07/18 116/68    Lab results reviewed with patient   ASSESSMENT AND PLAN:  Discussed the following assessment and plan:  Visit for preventive health examination  Medication management  Patient Care Team: Burnis Medin, MD as PCP - General (Internal Medicine) Dixie Dials, MD as Attending Physician (Internal Medicine) Meyer Cory, MD (Obstetrics and Gynecology) There are no Patient  Instructions on file for this visit.  Standley Brooking. Panosh M.D.

## 2018-04-03 ENCOUNTER — Encounter: Payer: BLUE CROSS/BLUE SHIELD | Admitting: Internal Medicine

## 2018-04-03 DIAGNOSIS — Z0289 Encounter for other administrative examinations: Secondary | ICD-10-CM

## 2018-04-06 DIAGNOSIS — F411 Generalized anxiety disorder: Secondary | ICD-10-CM | POA: Diagnosis not present

## 2018-04-06 DIAGNOSIS — F4310 Post-traumatic stress disorder, unspecified: Secondary | ICD-10-CM | POA: Diagnosis not present

## 2018-04-06 DIAGNOSIS — F331 Major depressive disorder, recurrent, moderate: Secondary | ICD-10-CM | POA: Diagnosis not present

## 2018-04-20 DIAGNOSIS — F411 Generalized anxiety disorder: Secondary | ICD-10-CM | POA: Diagnosis not present

## 2018-04-20 DIAGNOSIS — F331 Major depressive disorder, recurrent, moderate: Secondary | ICD-10-CM | POA: Diagnosis not present

## 2018-04-20 DIAGNOSIS — F4310 Post-traumatic stress disorder, unspecified: Secondary | ICD-10-CM | POA: Diagnosis not present

## 2018-04-25 DIAGNOSIS — M545 Low back pain: Secondary | ICD-10-CM | POA: Diagnosis not present

## 2018-04-25 DIAGNOSIS — R072 Precordial pain: Secondary | ICD-10-CM | POA: Diagnosis not present

## 2018-04-25 DIAGNOSIS — I10 Essential (primary) hypertension: Secondary | ICD-10-CM | POA: Diagnosis not present

## 2018-04-25 DIAGNOSIS — R002 Palpitations: Secondary | ICD-10-CM | POA: Diagnosis not present

## 2018-04-25 DIAGNOSIS — F5101 Primary insomnia: Secondary | ICD-10-CM | POA: Diagnosis not present

## 2018-05-04 DIAGNOSIS — F331 Major depressive disorder, recurrent, moderate: Secondary | ICD-10-CM | POA: Diagnosis not present

## 2018-05-04 DIAGNOSIS — F411 Generalized anxiety disorder: Secondary | ICD-10-CM | POA: Diagnosis not present

## 2018-05-04 DIAGNOSIS — F4310 Post-traumatic stress disorder, unspecified: Secondary | ICD-10-CM | POA: Diagnosis not present

## 2018-05-05 ENCOUNTER — Ambulatory Visit
Admission: RE | Admit: 2018-05-05 | Discharge: 2018-05-05 | Disposition: A | Discharge status: Home / Self Care | Payer: BLUE CROSS/BLUE SHIELD | Source: Ambulatory Visit | Attending: Cardiovascular Disease | Admitting: Cardiovascular Disease

## 2018-05-05 ENCOUNTER — Other Ambulatory Visit: Payer: Self-pay | Admitting: Cardiovascular Disease

## 2018-05-05 DIAGNOSIS — M4726 Other spondylosis with radiculopathy, lumbar region: Secondary | ICD-10-CM | POA: Diagnosis not present

## 2018-05-05 DIAGNOSIS — M549 Dorsalgia, unspecified: Secondary | ICD-10-CM

## 2018-05-10 ENCOUNTER — Encounter: Payer: Self-pay | Admitting: Neurology

## 2018-05-10 ENCOUNTER — Ambulatory Visit: Payer: BLUE CROSS/BLUE SHIELD | Admitting: Neurology

## 2018-05-10 ENCOUNTER — Other Ambulatory Visit: Payer: Self-pay

## 2018-05-10 ENCOUNTER — Telehealth: Payer: Self-pay | Admitting: Neurology

## 2018-05-10 VITALS — BP 101/70 | HR 69 | Ht 66.75 in | Wt 201.0 lb

## 2018-05-10 DIAGNOSIS — R413 Other amnesia: Secondary | ICD-10-CM

## 2018-05-10 MED ORDER — PAROXETINE HCL ER 37.5 MG PO TB24: 37.5000 mg | ORAL_TABLET | Freq: Every day | ORAL | 3 refills | Status: DC

## 2018-05-10 NOTE — Telephone Encounter (Signed)
MR Brain wo contrast Dr. Cheree Ditto Auth: [id number] (exp. 05/10/18 to 06/08/18). Patient is scheduled at [facility] for 05/16/18.

## 2018-05-10 NOTE — Progress Notes (Signed)
Reason for visit: Memory disturbance  Referring physician: Dr. Eric Form is a 57 y.o. female  History of present illness:  [name] is a 57 year old left-handed black female with a history of a memory disturbance that she claims has been present since around 2007 or 2008.  The patient was seen by another neurologist, she cannot remember the name of the doctor.  The patient has had ongoing issues over many years, she believes it may have gotten gradually worse over time.  The patient reports that she has had a significant issue with underlying anxiety, she has had a panic disorder in the past, but she has not had any recent panic attacks.  She is followed through mental health for this, she is on Paxil and she is on a beta-blocker which may also help anxiety.  The patient indicates that she usually sleeps fairly well at night, but not always.  She does have some chronic neck and low back pain which may keep her awake.  The patient is managing a business, she has a high intensity of information input throughout the day and she finds it difficult to keep up.  She has difficulty focusing.  She may misplace things about the house frequently.  She is able to operate a motor vehicle without difficulty, she manages her finances well, she is able to keep up with medications and appointments.  She has not given up any activities of daily living because of memory.  The patient denies any focal numbness or weakness of the face, arms, legs.  She does report some mild balance changes.  She claims that her mother who is in her 71s does have some slight memory troubles, her maternal grandmother had Alzheimer's disease.  The patient is sent to this office for an evaluation.  She has recently had blood work to include a thyroid profile and a vitamin B12 level that were unremarkable.  Past Medical History:  Diagnosis Date  . Anemia   . Colon polyp 08/28/2012   Tubular adenoma  . Dense breast   .  Genital warts   . GERD (gastroesophageal reflux disease)   . Hemorrhoids   . Hx of cardiac catheterization 2009   normal coronary arteries  . Hx of menorrhagia    Novasure  . Hypokalemia   . Hypomagnesemia   . Hypophosphatemia   . Lactose intolerance in adult   . Mitral valve prolapse   . Normal cardiac stress test 03/09/2012   no ischemia  . Positive PPD   . Rapid heart beat    States "irreg heart beat"  . Thyroid disease    Thyroid nodules    Past Surgical History:  Procedure Laterality Date  . CARDIAC CATHETERIZATION     x2 with normal results per pt  . NOVASURE ABLATION    . UPPER GASTROINTESTINAL ENDOSCOPY  May 2013    Family History  Problem Relation Age of Onset  . Heart disease Father        died Mi 53  . Hypertension Maternal Grandfather   . Diabetes Maternal Grandfather   . Heart disease Maternal Grandfather   . Colon cancer Maternal Grandfather   . Rectal cancer Maternal Grandfather   . Diabetes Maternal Grandmother   . Prostate cancer Neg Hx   . Breast cancer Neg Hx     Social history:  reports that she has never smoked. She has never used smokeless tobacco. She reports that she does not drink alcohol or  use drugs.  Medications:  Prior to Admission medications   Medication Sig Start Date End Date Taking? Authorizing Provider  CALCIUM PO Take 2 tablets by mouth daily.   Yes [provider]  cholecalciferol (VITAMIN D) 1000 UNITS tablet Take 1,000 Units by mouth daily.    Yes [provider]  losartan (COZAAR) 25 MG tablet Take 25 mg by mouth daily.   Yes [provider]  metoprolol tartrate (LOPRESSOR) 25 MG tablet Take 25 mg by mouth 2 (two) times daily. 12/29/15  Yes [provider]  potassium chloride SA (K-DUR,KLOR-CON) 20 MEQ tablet TAKE 1 TABLET BY MOUTH TWICE DAILY 03/10/18  Yes Panosh, Standley Brooking, MD  PARoxetine (PAXIL CR) 37.5 MG 24 hr tablet Take 1 tablet (37.5 mg total) by mouth daily. 05/10/18   Kathrynn Ducking, MD      Allergies  Allergen Reactions  . Ceftriaxone Sodium Itching  . Doxycycline Rash    ROS:  Out of a complete 14 system review of symptoms, the patient complains only of the following symptoms, and all other reviewed systems are negative.  Palpitations of the heart, heart murmur Joint pain Runny nose Memory loss, confusion, dizziness Anxiety Snoring  Blood pressure 101/70, pulse 69, height 5' 6.75" (1.695 m), weight 201 lb (91.2 kg), last menstrual period 09/18/2016, SpO2 98 %.  Physical Exam  General: The patient is alert and cooperative at the time of the examination.  Eyes: Pupils are equal, round, and reactive to light. Discs are flat bilaterally.  Neck: The neck is supple, no carotid bruits are noted.  Respiratory: The respiratory examination is clear.  Cardiovascular: The cardiovascular examination reveals a regular rate and rhythm, no obvious murmurs or rubs are noted.  Skin: Extremities are without significant edema.  Neurologic Exam  Mental status: The patient is alert and oriented x 3 at the time of the examination. The patient has apparent normal recent and remote memory, with an apparently normal attention span and concentration ability.  Mini-Mental status examination done today shows a total score 30/30.  Cranial nerves: Facial symmetry is present. There is good sensation of the face to pinprick and soft touch bilaterally. The strength of the facial muscles and the muscles to head turning and shoulder shrug are normal bilaterally. Speech is well enunciated, no aphasia or dysarthria is noted. Extraocular movements are full. Visual fields are full. The tongue is midline, and the patient has symmetric elevation of the soft palate. No obvious hearing deficits are noted.  Motor: The motor testing reveals 5 over 5 strength of all 4 extremities. Good symmetric motor tone is noted throughout.  Sensory: Sensory testing is intact to pinprick, soft touch,  vibration sensation, and position sense on all 4 extremities. No evidence of extinction is noted.  Coordination: Cerebellar testing reveals good finger-nose-finger and heel-to-shin bilaterally.  Gait and station: Gait is normal. Tandem gait is normal. Romberg is negative. No drift is seen.  Reflexes: Deep tendon reflexes are symmetric and normal bilaterally. Toes are downgoing bilaterally.   Assessment/Plan:  1.  Reports of memory disturbance  2.  Anxiety disorder  The patient likely has significant problems with anxiety that her underlying some of her reported memory issues.  The patient will be increased on the Paxil taking 37.5 mg CR tablet daily.  The patient will be sent for MRI of the brain.  She will follow-up in 6 months.  The patient will be referred for speech therapy evaluation to undergo cognitive therapy.  The  patient admits that she is not very well organized, she does not take a lot of notes and may have some difficulty keeping up with the business activities secondary to this.  Jill Alexanders MD 05/10/2018 10:05 AM  [facility] [address] [place], [place] [address]  Phone [phone] Fax [phone]

## 2018-05-16 ENCOUNTER — Ambulatory Visit (INDEPENDENT_AMBULATORY_CARE_PROVIDER_SITE_OTHER): Payer: BLUE CROSS/BLUE SHIELD

## 2018-05-16 DIAGNOSIS — R413 Other amnesia: Secondary | ICD-10-CM

## 2018-05-18 ENCOUNTER — Telehealth: Payer: Self-pay | Admitting: Neurology

## 2018-05-18 NOTE — Telephone Encounter (Signed)
  I called the patient.  MRI of the brain is unremarkable, the patient likely has significant anxiety issues and some problems with disorganization as an etiology for her cognitive dysfunction.   MRI brain 05/17/18:  IMPRESSION:   Unremarkable MRI brain (without). No acute findings.

## 2018-05-19 ENCOUNTER — Encounter: Payer: BLUE CROSS/BLUE SHIELD | Admitting: Internal Medicine

## 2018-05-25 ENCOUNTER — Ambulatory Visit (INDEPENDENT_AMBULATORY_CARE_PROVIDER_SITE_OTHER): Payer: BLUE CROSS/BLUE SHIELD | Admitting: Emergency Medicine

## 2018-05-25 ENCOUNTER — Encounter: Payer: Self-pay | Admitting: Emergency Medicine

## 2018-05-25 ENCOUNTER — Other Ambulatory Visit: Payer: Self-pay

## 2018-05-25 VITALS — BP 104/68 | HR 58 | Temp 98.4°F | Resp 16 | Ht 66.0 in | Wt 198.6 lb

## 2018-05-25 DIAGNOSIS — K219 Gastro-esophageal reflux disease without esophagitis: Secondary | ICD-10-CM | POA: Diagnosis not present

## 2018-05-25 DIAGNOSIS — Z0001 Encounter for general adult medical examination with abnormal findings: Secondary | ICD-10-CM | POA: Diagnosis not present

## 2018-05-25 DIAGNOSIS — Z1329 Encounter for screening for other suspected endocrine disorder: Secondary | ICD-10-CM

## 2018-05-25 DIAGNOSIS — Z0000 Encounter for general adult medical examination without abnormal findings: Secondary | ICD-10-CM | POA: Diagnosis not present

## 2018-05-25 DIAGNOSIS — Z130 Encounter for screening for diseases of the blood and blood-forming organs and certain disorders involving the immune mechanism: Secondary | ICD-10-CM

## 2018-05-25 DIAGNOSIS — R7303 Prediabetes: Secondary | ICD-10-CM | POA: Diagnosis not present

## 2018-05-25 DIAGNOSIS — Z8719 Personal history of other diseases of the digestive system: Secondary | ICD-10-CM

## 2018-05-25 DIAGNOSIS — Z1321 Encounter for screening for nutritional disorder: Secondary | ICD-10-CM

## 2018-05-25 DIAGNOSIS — E042 Nontoxic multinodular goiter: Secondary | ICD-10-CM | POA: Diagnosis not present

## 2018-05-25 DIAGNOSIS — Z13228 Encounter for screening for other metabolic disorders: Secondary | ICD-10-CM

## 2018-05-25 NOTE — Progress Notes (Addendum)
[name] 57 y.o.   Chief Complaint  Patient presents with  . Annual Exam    HISTORY OF PRESENT ILLNESS: This is a 57 y.o. female Here for annual exam; no complaints and no medical concerns. Smoking: No Drinking: No Sleeping: Adequate Work: Regular hours Exercise: Can be better Stress: High Nutrition: Average   HPI   Prior to Admission medications   Medication Sig Start Date End Date Taking? Authorizing Provider  CALCIUM PO Take 2 tablets by mouth daily.   Yes [provider]  cholecalciferol (VITAMIN D) 1000 UNITS tablet Take 1,000 Units by mouth daily.    Yes [provider]  losartan (COZAAR) 25 MG tablet Take 25 mg by mouth daily.   Yes [provider]  metoprolol tartrate (LOPRESSOR) 25 MG tablet Take 25 mg by mouth 2 (two) times daily. 12/29/15  Yes [provider]  PARoxetine (PAXIL CR) 37.5 MG 24 hr tablet Take 1 tablet (37.5 mg total) by mouth daily. 05/10/18  Yes Kathrynn Ducking, MD  potassium chloride SA (K-DUR,KLOR-CON) 20 MEQ tablet TAKE 1 TABLET BY MOUTH TWICE DAILY 03/10/18  Yes Panosh, Standley Brooking, MD  TURMERIC PO Take by mouth daily.   Yes [provider]  vitamin E (VITAMIN E) 400 UNIT capsule Take 400 Units by mouth daily.   Yes [provider]    Allergies  Allergen Reactions  . Ceftriaxone Sodium Itching  . Doxycycline Rash    Patient Active Problem List   Diagnosis Date Noted  . Acute bilateral thoracic back pain 01/07/2018  . Musculoskeletal pain 01/07/2018  . Strain of back 01/07/2018  . History of diverticulosis 05/04/2017  . Diarrhea of presumed infectious origin 05/04/2017  . Hypercalcemia 07/11/2013  . Hypocalcemia 07/05/2013  . Palpitations 07/05/2013  . Pes planus (flat feet) 03/29/2013  . History of PSVT (paroxysmal supraventricular tachycardia) 06/24/2012  . Contact dermatitis 06/23/2012  . Hypokalemia 06/23/2012  . Intertrigo 06/23/2012  . Hx of cardiac catheterization   .  Positive PPD   . Multinodular goiter 05/12/2012  . SLEEP APNEA 01/19/2008  . GERD 01/18/2008  . DYSPEPSIA 01/18/2008  . IRRITABLE BOWEL SYNDROME 01/18/2008    Past Medical History:  Diagnosis Date  . Anemia   . Colon polyp 08/28/2012   Tubular adenoma  . Dense breast   . Genital warts   . GERD (gastroesophageal reflux disease)   . Hemorrhoids   . Hx of cardiac catheterization 2009   normal coronary arteries  . Hx of menorrhagia    Novasure  . Hypokalemia   . Hypomagnesemia   . Hypophosphatemia   . Lactose intolerance in adult   . Mitral valve prolapse   . Normal cardiac stress test 03/09/2012   no ischemia  . Positive PPD   . Rapid heart beat    States "irreg heart beat"  . Thyroid disease    Thyroid nodules    Past Surgical History:  Procedure Laterality Date  . CARDIAC CATHETERIZATION     x2 with normal results per pt  . NOVASURE ABLATION    . UPPER GASTROINTESTINAL ENDOSCOPY  May 2013    Social History   Socioeconomic History  . Marital status: Divorced    Spouse name: Not on file  . Number of children: 2  . Years of education: 28  . Highest education level: Not on file  Occupational History  . Occupation: dialysis Engineer, production: [employer]  Social Needs  . Financial resource strain: Not  on file  . Food insecurity:    Worry: Not on file    Inability: Not on file  . Transportation needs:    Medical: Not on file    Non-medical: Not on file  Tobacco Use  . Smoking status: Never Smoker  . Smokeless tobacco: Never Used  Substance and Sexual Activity  . Alcohol use: No  . Drug use: No  . Sexual activity: Yes  Lifestyle  . Physical activity:    Days per week: Not on file    Minutes per session: Not on file  . Stress: Not on file  Relationships  . Social connections:    Talks on phone: Not on file    Gets together: Not on file    Attends religious service: Not on file    Active member of club or organization: Not on file     Attends meetings of clubs or organizations: Not on file    Relationship status: Not on file  . Intimate partner violence:    Fear of current or ex partner: Not on file    Emotionally abused: Not on file    Physically abused: Not on file    Forced sexual activity: Not on file  Other Topics Concern  . 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 She lived in [place] and in [place] for a number of years her mom is from [place]. 16 years of education went to college at [employer] lives at home with her son who is in his 79s no pets   Neg ets tob etoh hx PA   6 hours of sleep   G2P2    TD2010  colonoscopy 2009   Now running  own business    HH of 2    Left handed    Caffeine use: tea sometimes          Family History  Problem Relation Age of Onset  . Heart disease Father        died Mi 69  . Hypertension Maternal Grandfather   . Diabetes Maternal Grandfather   . Heart disease Maternal Grandfather   . Colon cancer Maternal Grandfather   . Rectal cancer Maternal Grandfather   . Diabetes Maternal Grandmother   . Prostate cancer Neg Hx   . Breast cancer Neg Hx      Review of Systems  Constitutional: Negative.  Negative for chills, fever and weight loss.  HENT: Negative.  Negative for sore throat.   Eyes: Negative.  Negative for blurred vision and double vision.  Respiratory: Negative.  Negative for cough and shortness of breath.   Cardiovascular: Positive for palpitations. Negative for chest pain.  Gastrointestinal: Negative.  Negative for abdominal pain, blood in stool, diarrhea, melena, nausea and vomiting.  Genitourinary: Negative.  Negative for dysuria and hematuria.  Musculoskeletal: Negative.  Negative for back pain, myalgias and neck pain.  Skin: Negative.  Negative for rash.  Neurological: Negative.  Negative for  dizziness, sensory change, focal weakness and headaches.  Endo/Heme/Allergies: Negative.     Vitals:   05/25/18 0907  BP: 104/68  Pulse: (!) 58  Resp: 16  Temp: 98.4 F (36.9 C)  SpO2: 98%    Physical Exam  Constitutional: She is oriented to person, place, and time. She appears well-developed and well-nourished.  HENT:  Head: Normocephalic and  atraumatic.  Nose: Nose normal.  Mouth/Throat: Oropharynx is clear and moist.  Eyes: Pupils are equal, round, and reactive to light. Conjunctivae and EOM are normal.  Neck: Normal range of motion. Neck supple. No JVD present.  Cardiovascular: Normal rate, regular rhythm and normal heart sounds.  Pulmonary/Chest: Effort normal and breath sounds normal.  Abdominal: Soft. Bowel sounds are normal. She exhibits no distension. There is no tenderness.  Musculoskeletal: Normal range of motion. She exhibits no edema or tenderness.  Lymphadenopathy:    She has no cervical adenopathy.  Neurological: She is alert and oriented to person, place, and time. No sensory deficit. She exhibits normal muscle tone.  Skin: Skin is warm and dry. Capillary refill takes less than 2 seconds.  Psychiatric: She has a normal mood and affect. Her behavior is normal.  Vitals reviewed.    ASSESSMENT & PLAN: [name] was seen today for annual exam.  Diagnoses and all orders for this visit:  Routine general medical examination at a health care facility -     CBC with Differential -     Comprehensive metabolic panel -     Hemoglobin A1c -     Lipid panel -     TSH  Multinodular goiter  History of diverticulosis  Gastroesophageal reflux disease, esophagitis presence not specified  Screening for endocrine, nutritional, metabolic and immunity disorder    Patient Instructions       If you have lab work done today you will be contacted with your lab results within the next 2 weeks.  If you have not heard from [place] then please contact us. The fastest way to  get your results is to register for My Chart.   IF you received an x-ray today, you will receive an invoice from [facility]. Please contact [facility] at [phone] with questions or concerns regarding your invoice.   IF you received labwork today, you will receive an invoice from [facility]. Please contact [facility] at [phone] with questions or concerns regarding your invoice.   Our billing staff will not be able to assist you with questions regarding bills from these companies.  You will be contacted with the lab results as soon as they are available. The fastest way to get your results is to activate your My Chart account. Instructions are located on the last page of this paperwork. If you have not heard from [place] regarding the results in 2 weeks, please contact this office.     Health Maintenance, Female Adopting a healthy lifestyle and getting preventive care can go a long way to promote health and wellness. Talk with your health care provider about what schedule of regular examinations is right for you. This is a good chance for you to check in with your provider about disease prevention and staying healthy. In between checkups, there are plenty of things you can do on your own. Experts have done a lot of research about which lifestyle changes and preventive measures are most likely to keep you healthy. Ask your health care provider for more information. Weight and diet Eat a healthy diet  Be sure to include plenty of vegetables, fruits, low-fat dairy products, and lean protein.  Do not eat a lot of foods high in solid fats, added sugars, or salt.  Get regular exercise. This is one of the most important things you can do for your health. ? Most adults should exercise for at least 150 minutes each week. The exercise should increase your heart rate and  make you sweat (moderate-intensity exercise). ? Most adults should also do strengthening exercises at least twice a  week. This is in addition to the moderate-intensity exercise.  Maintain a healthy weight  Body mass index (BMI) is a measurement that can be used to identify possible weight problems. It estimates body fat based on height and weight. Your health care provider can help determine your BMI and help you achieve or maintain a healthy weight.  For females 14 years of age and older: ? A BMI below 18.5 is considered underweight. ? A BMI of 18.5 to 24.9 is normal. ? A BMI of 25 to 29.9 is considered overweight. ? A BMI of 30 and above is considered obese.  Watch levels of cholesterol and blood lipids  You should start having your blood tested for lipids and cholesterol at 59 years of age, then have this test every 5 years.  You may need to have your cholesterol levels checked more often if: ? Your lipid or cholesterol levels are high. ? You are older than 59 years of age. ? You are at high risk for heart disease.  Cancer screening Lung Cancer  Lung cancer screening is recommended for adults 76-31 years old who are at high risk for lung cancer because of a history of smoking.  A yearly low-dose CT scan of the lungs is recommended for people who: ? Currently smoke. ? Have quit within the past 15 years. ? Have at least a 30-pack-year history of smoking. A pack year is smoking an average of one pack of cigarettes a day for 1 year.  Yearly screening should continue until it has been 15 years since you quit.  Yearly screening should stop if you develop a health problem that would prevent you from having lung cancer treatment.  Breast Cancer  Practice breast self-awareness. This means understanding how your breasts normally appear and feel.  It also means doing regular breast self-exams. Let your health care provider know about any changes, no matter how small.  If you are in your 20s or 30s, you should have a clinical breast exam (CBE) by a health care provider every 1-3 years as part of a  regular health exam.  If you are 66 or older, have a CBE every year. Also consider having a breast X-ray (mammogram) every year.  If you have a family history of breast cancer, talk to your health care provider about genetic screening.  If you are at high risk for breast cancer, talk to your health care provider about having an MRI and a mammogram every year.  Breast cancer gene (BRCA) assessment is recommended for women who have family members with BRCA-related cancers. BRCA-related cancers include: ? Breast. ? Ovarian. ? Tubal. ? Peritoneal cancers.  Results of the assessment will determine the need for genetic counseling and BRCA1 and BRCA2 testing.  Cervical Cancer Your health care provider may recommend that you be screened regularly for cancer of the pelvic organs (ovaries, uterus, and vagina). This screening involves a pelvic examination, including checking for microscopic changes to the surface of your cervix (Pap test). You may be encouraged to have this screening done every 3 years, beginning at age 47.  For women ages 39-65, health care providers may recommend pelvic exams and Pap testing every 3 years, or they may recommend the Pap and pelvic exam, combined with testing for human papilloma virus (HPV), every 5 years. Some types of HPV increase your risk of cervical cancer. Testing for HPV  may also be done on women of any age with unclear Pap test results.  Other health care providers may not recommend any screening for nonpregnant women who are considered low risk for pelvic cancer and who do not have symptoms. Ask your health care provider if a screening pelvic exam is right for you.  If you have had past treatment for cervical cancer or a condition that could lead to cancer, you need Pap tests and screening for cancer for at least 20 years after your treatment. If Pap tests have been discontinued, your risk factors (such as having a new sexual partner) need to be reassessed to  determine if screening should resume. Some women have medical problems that increase the chance of getting cervical cancer. In these cases, your health care provider may recommend more frequent screening and Pap tests.  Colorectal Cancer  This type of cancer can be detected and often prevented.  Routine colorectal cancer screening usually begins at 58 years of age and continues through 59 years of age.  Your health care provider may recommend screening at an earlier age if you have risk factors for colon cancer.  Your health care provider may also recommend using home test kits to check for hidden blood in the stool.  A small camera at the end of a tube can be used to examine your colon directly (sigmoidoscopy or colonoscopy). This is done to check for the earliest forms of colorectal cancer.  Routine screening usually begins at age 65.  Direct examination of the colon should be repeated every 5-10 years through 58 years of age. However, you may need to be screened more often if early forms of precancerous polyps or small growths are found.  Skin Cancer  Check your skin from head to toe regularly.  Tell your health care provider about any new moles or changes in moles, especially if there is a change in a mole's shape or color.  Also tell your health care provider if you have a mole that is larger than the size of a pencil eraser.  Always use sunscreen. Apply sunscreen liberally and repeatedly throughout the day.  Protect yourself by wearing long sleeves, pants, a wide-brimmed hat, and sunglasses whenever you are outside.  Heart disease, diabetes, and high blood pressure  High blood pressure causes heart disease and increases the risk of stroke. High blood pressure is more likely to develop in: ? People who have blood pressure in the high end of the normal range (130-139/85-89 mm Hg). ? People who are overweight or obese. ? People who are African American.  If you are 18-39 years  of age, have your blood pressure checked every 3-5 years. If you are 64 years of age or older, have your blood pressure checked every year. You should have your blood pressure measured twice-once when you are at a hospital or clinic, and once when you are not at a hospital or clinic. Record the average of the two measurements. To check your blood pressure when you are not at a hospital or clinic, you can use: ? An automated blood pressure machine at a pharmacy. ? A home blood pressure monitor.  If you are between 68 years and 21 years old, ask your health care provider if you should take aspirin to prevent strokes.  Have regular diabetes screenings. This involves taking a blood sample to check your fasting blood sugar level. ? If you are at a normal weight and have a low risk for diabetes,  have this test once every three years after 59 years of age. ? If you are overweight and have a high risk for diabetes, consider being tested at a younger age or more often. Preventing infection Hepatitis B  If you have a higher risk for hepatitis B, you should be screened for this virus. You are considered at high risk for hepatitis B if: ? You were born in a country where hepatitis B is common. Ask your health care provider which countries are considered high risk. ? Your parents were born in a high-risk country, and you have not been immunized against hepatitis B (hepatitis B vaccine). ? You have HIV or AIDS. ? You use needles to inject street drugs. ? You live with someone who has hepatitis B. ? You have had sex with someone who has hepatitis B. ? You get hemodialysis treatment. ? You take certain medicines for conditions, including cancer, organ transplantation, and autoimmune conditions.  Hepatitis C  Blood testing is recommended for: ? Everyone born from [birth year] through 1967. ? Anyone with known risk factors for hepatitis C.  Sexually transmitted infections (STIs)  You should be screened for  sexually transmitted infections (STIs) including gonorrhea and chlamydia if: ? You are sexually active and are younger than 59 years of age. ? You are older than 59 years of age and your health care provider tells you that you are at risk for this type of infection. ? Your sexual activity has changed since you were last screened and you are at an increased risk for chlamydia or gonorrhea. Ask your health care provider if you are at risk.  If you do not have HIV, but are at risk, it may be recommended that you take a prescription medicine daily to prevent HIV infection. This is called pre-exposure prophylaxis (PrEP). You are considered at risk if: ? You are sexually active and do not regularly use condoms or know the HIV status of your partner(s). ? You take drugs by injection. ? You are sexually active with a partner who has HIV.  Talk with your health care provider about whether you are at high risk of being infected with HIV. If you choose to begin PrEP, you should first be tested for HIV. You should then be tested every 3 months for as long as you are taking PrEP. Pregnancy  If you are premenopausal and you may become pregnant, ask your health care provider about preconception counseling.  If you may become pregnant, take 400 to 800 micrograms (mcg) of folic acid every day.  If you want to prevent pregnancy, talk to your health care provider about birth control (contraception). Osteoporosis and menopause  Osteoporosis is a disease in which the bones lose minerals and strength with aging. This can result in serious bone fractures. Your risk for osteoporosis can be identified using a bone density scan.  If you are 51 years of age or older, or if you are at risk for osteoporosis and fractures, ask your health care provider if you should be screened.  Ask your health care provider whether you should take a calcium or vitamin D supplement to lower your risk for osteoporosis.  Menopause may  have certain physical symptoms and risks.  Hormone replacement therapy may reduce some of these symptoms and risks. Talk to your health care provider about whether hormone replacement therapy is right for you. Follow these instructions at home:  Schedule regular health, dental, and eye exams.  Stay current with your immunizations.  Do not use any tobacco products including cigarettes, chewing tobacco, or electronic cigarettes.  If you are pregnant, do not drink alcohol.  If you are breastfeeding, limit how much and how often you drink alcohol.  Limit alcohol intake to no more than 1 drink per day for nonpregnant women. One drink equals 12 ounces of beer, 5 ounces of wine, or 1 ounces of hard liquor.  Do not use street drugs.  Do not share needles.  Ask your health care provider for help if you need support or information about quitting drugs.  Tell your health care provider if you often feel depressed.  Tell your health care provider if you have ever been abused or do not feel safe at home. This information is not intended to replace advice given to you by your health care provider. Make sure you discuss any questions you have with your health care provider. Document Released: 03/15/2011 Document Revised: 02/05/2016 Document Reviewed: 06/03/2015 Elsevier Interactive Patient Education  2018 [employer]      Agustina Caroli, MD [facility]

## 2018-05-25 NOTE — Patient Instructions (Addendum)
If you have lab work done today you will be contacted with your lab results within the next 2 weeks.  If you have not heard from [place] then please contact us. The fastest way to get your results is to register for My Chart.   IF you received an x-ray today, you will receive an invoice from [facility]. Please contact [facility] at [phone] with questions or concerns regarding your invoice.   IF you received labwork today, you will receive an invoice from [place]. Please contact [facility] at [phone] with questions or concerns regarding your invoice.   Our billing staff will not be able to assist you with questions regarding bills from these companies.  You will be contacted with the lab results as soon as they are available. The fastest way to get your results is to activate your My Chart account. Instructions are located on the last page of this paperwork. If you have not heard from [place] regarding the results in 2 weeks, please contact this office.     Health Maintenance, Female Adopting a healthy lifestyle and getting preventive care can go a long way to promote health and wellness. Talk with your health care provider about what schedule of regular examinations is right for you. This is a good chance for you to check in with your provider about disease prevention and staying healthy. In between checkups, there are plenty of things you can do on your own. Experts have done a lot of research about which lifestyle changes and preventive measures are most likely to keep you healthy. Ask your health care provider for more information. Weight and diet Eat a healthy diet  Be sure to include plenty of vegetables, fruits, low-fat dairy products, and lean protein.  Do not eat a lot of foods high in solid fats, added sugars, or salt.  Get regular exercise. This is one of the most important things you can do for your health. ? Most adults should exercise for at least 150  minutes each week. The exercise should increase your heart rate and make you sweat (moderate-intensity exercise). ? Most adults should also do strengthening exercises at least twice a week. This is in addition to the moderate-intensity exercise.  Maintain a healthy weight  Body mass index (BMI) is a measurement that can be used to identify possible weight problems. It estimates body fat based on height and weight. Your health care provider can help determine your BMI and help you achieve or maintain a healthy weight.  For females 22 years of age and older: ? A BMI below 18.5 is considered underweight. ? A BMI of 18.5 to 24.9 is normal. ? A BMI of 25 to 29.9 is considered overweight. ? A BMI of 30 and above is considered obese.  Watch levels of cholesterol and blood lipids  You should start having your blood tested for lipids and cholesterol at 60 years of age, then have this test every 5 years.  You may need to have your cholesterol levels checked more often if: ? Your lipid or cholesterol levels are high. ? You are older than 59 years of age. ? You are at high risk for heart disease.  Cancer screening Lung Cancer  Lung cancer screening is recommended for adults 58-34 years old who are at high risk for lung cancer because of a history of smoking.  A yearly low-dose CT scan of the lungs is recommended for people who: ? Currently smoke. ? Have quit  within the past 15 years. ? Have at least a 30-pack-year history of smoking. A pack year is smoking an average of one pack of cigarettes a day for 1 year.  Yearly screening should continue until it has been 15 years since you quit.  Yearly screening should stop if you develop a health problem that would prevent you from having lung cancer treatment.  Breast Cancer  Practice breast self-awareness. This means understanding how your breasts normally appear and feel.  It also means doing regular breast self-exams. Let your health care  provider know about any changes, no matter how small.  If you are in your 20s or 30s, you should have a clinical breast exam (CBE) by a health care provider every 1-3 years as part of a regular health exam.  If you are 48 or older, have a CBE every year. Also consider having a breast X-ray (mammogram) every year.  If you have a family history of breast cancer, talk to your health care provider about genetic screening.  If you are at high risk for breast cancer, talk to your health care provider about having an MRI and a mammogram every year.  Breast cancer gene (BRCA) assessment is recommended for women who have family members with BRCA-related cancers. BRCA-related cancers include: ? Breast. ? Ovarian. ? Tubal. ? Peritoneal cancers.  Results of the assessment will determine the need for genetic counseling and BRCA1 and BRCA2 testing.  Cervical Cancer Your health care provider may recommend that you be screened regularly for cancer of the pelvic organs (ovaries, uterus, and vagina). This screening involves a pelvic examination, including checking for microscopic changes to the surface of your cervix (Pap test). You may be encouraged to have this screening done every 3 years, beginning at age 8.  For women ages 8-65, health care providers may recommend pelvic exams and Pap testing every 3 years, or they may recommend the Pap and pelvic exam, combined with testing for human papilloma virus (HPV), every 5 years. Some types of HPV increase your risk of cervical cancer. Testing for HPV may also be done on women of any age with unclear Pap test results.  Other health care providers may not recommend any screening for nonpregnant women who are considered low risk for pelvic cancer and who do not have symptoms. Ask your health care provider if a screening pelvic exam is right for you.  If you have had past treatment for cervical cancer or a condition that could lead to cancer, you need Pap tests  and screening for cancer for at least 20 years after your treatment. If Pap tests have been discontinued, your risk factors (such as having a new sexual partner) need to be reassessed to determine if screening should resume. Some women have medical problems that increase the chance of getting cervical cancer. In these cases, your health care provider may recommend more frequent screening and Pap tests.  Colorectal Cancer  This type of cancer can be detected and often prevented.  Routine colorectal cancer screening usually begins at 59 years of age and continues through 59 years of age.  Your health care provider may recommend screening at an earlier age if you have risk factors for colon cancer.  Your health care provider may also recommend using home test kits to check for hidden blood in the stool.  A small camera at the end of a tube can be used to examine your colon directly (sigmoidoscopy or colonoscopy). This is done to check  for the earliest forms of colorectal cancer.  Routine screening usually begins at age 75.  Direct examination of the colon should be repeated every 5-10 years through 59 years of age. However, you may need to be screened more often if early forms of precancerous polyps or small growths are found.  Skin Cancer  Check your skin from head to toe regularly.  Tell your health care provider about any new moles or changes in moles, especially if there is a change in a mole's shape or color.  Also tell your health care provider if you have a mole that is larger than the size of a pencil eraser.  Always use sunscreen. Apply sunscreen liberally and repeatedly throughout the day.  Protect yourself by wearing long sleeves, pants, a wide-brimmed hat, and sunglasses whenever you are outside.  Heart disease, diabetes, and high blood pressure  High blood pressure causes heart disease and increases the risk of stroke. High blood pressure is more likely to develop  in: ? People who have blood pressure in the high end of the normal range (130-139/85-89 mm Hg). ? People who are overweight or obese. ? People who are African American.  If you are 41-67 years of age, have your blood pressure checked every 3-5 years. If you are 32 years of age or older, have your blood pressure checked every year. You should have your blood pressure measured twice-once when you are at a hospital or clinic, and once when you are not at a hospital or clinic. Record the average of the two measurements. To check your blood pressure when you are not at a hospital or clinic, you can use: ? An automated blood pressure machine at a pharmacy. ? A home blood pressure monitor.  If you are between 44 years and 70 years old, ask your health care provider if you should take aspirin to prevent strokes.  Have regular diabetes screenings. This involves taking a blood sample to check your fasting blood sugar level. ? If you are at a normal weight and have a low risk for diabetes, have this test once every three years after 59 years of age. ? If you are overweight and have a high risk for diabetes, consider being tested at a younger age or more often. Preventing infection Hepatitis B  If you have a higher risk for hepatitis B, you should be screened for this virus. You are considered at high risk for hepatitis B if: ? You were born in a country where hepatitis B is common. Ask your health care provider which countries are considered high risk. ? Your parents were born in a high-risk country, and you have not been immunized against hepatitis B (hepatitis B vaccine). ? You have HIV or AIDS. ? You use needles to inject street drugs. ? You live with someone who has hepatitis B. ? You have had sex with someone who has hepatitis B. ? You get hemodialysis treatment. ? You take certain medicines for conditions, including cancer, organ transplantation, and autoimmune conditions.  Hepatitis C  Blood  testing is recommended for: ? Everyone born from [birth year] through 1967. ? Anyone with known risk factors for hepatitis C.  Sexually transmitted infections (STIs)  You should be screened for sexually transmitted infections (STIs) including gonorrhea and chlamydia if: ? You are sexually active and are younger than 59 years of age. ? You are older than 59 years of age and your health care provider tells you that you are at risk for  this type of infection. ? Your sexual activity has changed since you were last screened and you are at an increased risk for chlamydia or gonorrhea. Ask your health care provider if you are at risk.  If you do not have HIV, but are at risk, it may be recommended that you take a prescription medicine daily to prevent HIV infection. This is called pre-exposure prophylaxis (PrEP). You are considered at risk if: ? You are sexually active and do not regularly use condoms or know the HIV status of your partner(s). ? You take drugs by injection. ? You are sexually active with a partner who has HIV.  Talk with your health care provider about whether you are at high risk of being infected with HIV. If you choose to begin PrEP, you should first be tested for HIV. You should then be tested every 3 months for as long as you are taking PrEP. Pregnancy  If you are premenopausal and you may become pregnant, ask your health care provider about preconception counseling.  If you may become pregnant, take 400 to 800 micrograms (mcg) of folic acid every day.  If you want to prevent pregnancy, talk to your health care provider about birth control (contraception). Osteoporosis and menopause  Osteoporosis is a disease in which the bones lose minerals and strength with aging. This can result in serious bone fractures. Your risk for osteoporosis can be identified using a bone density scan.  If you are 68 years of age or older, or if you are at risk for osteoporosis and fractures, ask your  health care provider if you should be screened.  Ask your health care provider whether you should take a calcium or vitamin D supplement to lower your risk for osteoporosis.  Menopause may have certain physical symptoms and risks.  Hormone replacement therapy may reduce some of these symptoms and risks. Talk to your health care provider about whether hormone replacement therapy is right for you. Follow these instructions at home:  Schedule regular health, dental, and eye exams.  Stay current with your immunizations.  Do not use any tobacco products including cigarettes, chewing tobacco, or electronic cigarettes.  If you are pregnant, do not drink alcohol.  If you are breastfeeding, limit how much and how often you drink alcohol.  Limit alcohol intake to no more than 1 drink per day for nonpregnant women. One drink equals 12 ounces of beer, 5 ounces of wine, or 1 ounces of hard liquor.  Do not use street drugs.  Do not share needles.  Ask your health care provider for help if you need support or information about quitting drugs.  Tell your health care provider if you often feel depressed.  Tell your health care provider if you have ever been abused or do not feel safe at home. This information is not intended to replace advice given to you by your health care provider. Make sure you discuss any questions you have with your health care provider. Document Released: 03/15/2011 Document Revised: 02/05/2016 Document Reviewed: 06/03/2015 Elsevier Interactive Patient Education  [employer].

## 2018-05-26 ENCOUNTER — Encounter: Payer: Self-pay | Admitting: *Deleted

## 2018-05-26 ENCOUNTER — Encounter: Payer: Self-pay | Admitting: Emergency Medicine

## 2018-05-26 LAB — CBC WITH DIFFERENTIAL/PLATELET
BASOS: 1 %
Basophils Absolute: 0 10*3/uL (ref 0.0–0.2)
EOS (ABSOLUTE): 0.1 10*3/uL (ref 0.0–0.4)
Eos: 1 %
Hematocrit: 43 % (ref 34.0–46.6)
Hemoglobin: 13.4 g/dL (ref 11.1–15.9)
Immature Grans (Abs): 0 10*3/uL (ref 0.0–0.1)
Immature Granulocytes: 0 %
Lymphocytes Absolute: 1.2 10*3/uL (ref 0.7–3.1)
Lymphs: 24 %
MCH: 23.8 pg — AB (ref 26.6–33.0)
MCHC: 31.2 g/dL — AB (ref 31.5–35.7)
MCV: 77 fL — AB (ref 79–97)
MONOS ABS: 0.6 10*3/uL (ref 0.1–0.9)
Monocytes: 12 %
NEUTROS PCT: 62 %
Neutrophils Absolute: 3.2 10*3/uL (ref 1.4–7.0)
PLATELETS: 228 10*3/uL (ref 150–450)
RBC: 5.62 x10E6/uL — ABNORMAL HIGH (ref 3.77–5.28)
RDW: 14.7 % (ref 12.3–15.4)
WBC: 5.1 10*3/uL (ref 3.4–10.8)

## 2018-05-26 LAB — HEMOGLOBIN A1C
ESTIMATED AVERAGE GLUCOSE: 123 mg/dL
Hgb A1c MFr Bld: 5.9 % — ABNORMAL HIGH (ref 4.8–5.6)

## 2018-05-26 LAB — TSH: TSH: 0.903 u[IU]/mL (ref 0.450–4.500)

## 2018-05-26 LAB — LIPID PANEL
CHOLESTEROL TOTAL: 141 mg/dL (ref 100–199)
Chol/HDL Ratio: 2.7 ratio (ref 0.0–4.4)
HDL: 52 mg/dL (ref >39)
LDL CALC: 76 mg/dL (ref 0–99)
Triglycerides: 67 mg/dL (ref 0–149)
VLDL Cholesterol Cal: 13 mg/dL (ref 5–40)

## 2018-05-26 LAB — COMPREHENSIVE METABOLIC PANEL
A/G RATIO: 1.9 (ref 1.2–2.2)
ALT: 22 IU/L (ref 0–32)
AST: 21 IU/L (ref 0–40)
Albumin: 4.4 g/dL (ref 3.5–5.5)
Alkaline Phosphatase: 75 IU/L (ref 39–117)
BILIRUBIN TOTAL: 0.3 mg/dL (ref 0.0–1.2)
BUN/Creatinine Ratio: 12 (ref 9–23)
BUN: 9 mg/dL (ref 6–24)
CHLORIDE: 105 mmol/L (ref 96–106)
CO2: 24 mmol/L (ref 20–29)
Calcium: 9.7 mg/dL (ref 8.7–10.2)
Creatinine, Ser: 0.76 mg/dL (ref 0.57–1.00)
GFR calc Af Amer: 100 mL/min/{1.73_m2} (ref >59)
GFR calc non Af Amer: 87 mL/min/{1.73_m2} (ref >59)
Globulin, Total: 2.3 g/dL (ref 1.5–4.5)
Glucose: 85 mg/dL (ref 65–99)
POTASSIUM: 4.7 mmol/L (ref 3.5–5.2)
Sodium: 146 mmol/L — ABNORMAL HIGH (ref 134–144)
Total Protein: 6.7 g/dL (ref 6.0–8.5)

## 2018-06-29 DIAGNOSIS — E663 Overweight: Secondary | ICD-10-CM | POA: Diagnosis not present

## 2018-06-29 DIAGNOSIS — R072 Precordial pain: Secondary | ICD-10-CM | POA: Diagnosis not present

## 2018-06-29 DIAGNOSIS — R002 Palpitations: Secondary | ICD-10-CM | POA: Diagnosis not present

## 2018-07-05 ENCOUNTER — Other Ambulatory Visit: Payer: Self-pay | Admitting: Internal Medicine

## 2018-07-06 DIAGNOSIS — H43811 Vitreous degeneration, right eye: Secondary | ICD-10-CM | POA: Diagnosis not present

## 2018-07-06 DIAGNOSIS — H04123 Dry eye syndrome of bilateral lacrimal glands: Secondary | ICD-10-CM | POA: Diagnosis not present

## 2018-07-24 DIAGNOSIS — F4310 Post-traumatic stress disorder, unspecified: Secondary | ICD-10-CM | POA: Diagnosis not present

## 2018-07-24 DIAGNOSIS — F331 Major depressive disorder, recurrent, moderate: Secondary | ICD-10-CM | POA: Diagnosis not present

## 2018-07-24 DIAGNOSIS — F411 Generalized anxiety disorder: Secondary | ICD-10-CM | POA: Diagnosis not present

## 2018-07-28 DIAGNOSIS — R072 Precordial pain: Secondary | ICD-10-CM | POA: Diagnosis not present

## 2018-07-28 DIAGNOSIS — R002 Palpitations: Secondary | ICD-10-CM | POA: Diagnosis not present

## 2018-07-28 DIAGNOSIS — L739 Follicular disorder, unspecified: Secondary | ICD-10-CM | POA: Diagnosis not present

## 2018-07-28 DIAGNOSIS — E663 Overweight: Secondary | ICD-10-CM | POA: Diagnosis not present

## 2018-08-16 ENCOUNTER — Encounter: Payer: Self-pay | Admitting: Emergency Medicine

## 2018-08-16 ENCOUNTER — Other Ambulatory Visit: Payer: Self-pay

## 2018-08-16 ENCOUNTER — Ambulatory Visit: Payer: BLUE CROSS/BLUE SHIELD | Admitting: Emergency Medicine

## 2018-08-16 ENCOUNTER — Ambulatory Visit (INDEPENDENT_AMBULATORY_CARE_PROVIDER_SITE_OTHER): Payer: BLUE CROSS/BLUE SHIELD

## 2018-08-16 VITALS — BP 133/78 | HR 135 | Temp 100.9°F | Resp 16 | Ht 66.0 in | Wt 198.8 lb

## 2018-08-16 DIAGNOSIS — R002 Palpitations: Secondary | ICD-10-CM

## 2018-08-16 DIAGNOSIS — R059 Cough, unspecified: Secondary | ICD-10-CM

## 2018-08-16 DIAGNOSIS — R05 Cough: Secondary | ICD-10-CM | POA: Diagnosis not present

## 2018-08-16 DIAGNOSIS — R509 Fever, unspecified: Secondary | ICD-10-CM | POA: Diagnosis not present

## 2018-08-16 DIAGNOSIS — Z8679 Personal history of other diseases of the circulatory system: Secondary | ICD-10-CM

## 2018-08-16 DIAGNOSIS — J22 Unspecified acute lower respiratory infection: Secondary | ICD-10-CM | POA: Insufficient documentation

## 2018-08-16 LAB — POCT CBC
Granulocyte percent: 88.1 %G — AB (ref 37–80)
HCT, POC: 41.5 % — AB (ref 29–41)
Hemoglobin: 13.5 g/dL (ref 9.5–13.5)
Lymph, poc: 1.1 (ref 0.6–3.4)
MCH, POC: 24.2 pg — AB (ref 27–31.2)
MCHC: 32.5 g/dL (ref 31.8–35.4)
MCV: 74.6 fL — AB (ref 76–111)
MID (cbc): 0.3 (ref 0–0.9)
MPV: 7.3 fL (ref 0–99.8)
POC Granulocyte: 10.5 — AB (ref 2–6.9)
POC LYMPH PERCENT: 9 %L — AB (ref 10–50)
POC MID %: 2.9 %M (ref 0–12)
Platelet Count, POC: 233 10*3/uL (ref 142–424)
RBC: 5.56 M/uL — AB (ref 4.04–5.48)
RDW, POC: 14.3 %
WBC: 11.9 10*3/uL — AB (ref 4.6–10.2)

## 2018-08-16 LAB — POC INFLUENZA A&B (BINAX/QUICKVUE)
Influenza A, POC: NEGATIVE
Influenza B, POC: NEGATIVE

## 2018-08-16 MED ORDER — AMOXICILLIN-POT CLAVULANATE 875-125 MG PO TABS: 1.0000 | ORAL_TABLET | Freq: Two times a day (BID) | ORAL | 0 refills | Status: AC

## 2018-08-16 MED ORDER — BENZONATATE 200 MG PO CAPS: 200.0000 mg | ORAL_CAPSULE | Freq: Two times a day (BID) | ORAL | 0 refills | Status: DC | PRN

## 2018-08-16 MED ORDER — PROMETHAZINE-CODEINE 6.25-10 MG/5ML PO SYRP: 5.0000 mL | ORAL_SOLUTION | Freq: Every evening | ORAL | 0 refills | Status: DC | PRN

## 2018-08-16 NOTE — Patient Instructions (Addendum)
     If you have lab work done today you will be contacted with your lab results within the next 2 weeks.  If you have not heard from [place] then please contact us. The fastest way to get your results is to register for My Chart.   IF you received an x-ray today, you will receive an invoice from [facility]. Please contact [facility] at [phone] with questions or concerns regarding your invoice.   IF you received labwork today, you will receive an invoice from [place]. Please contact [facility] at [phone] with questions or concerns regarding your invoice.   Our billing staff will not be able to assist you with questions regarding bills from these companies.  You will be contacted with the lab results as soon as they are available. The fastest way to get your results is to activate your My Chart account. Instructions are located on the last page of this paperwork. If you have not heard from [place] regarding the results in 2 weeks, please contact this office.     Acute Bronchitis, Adult Acute bronchitis is when air tubes (bronchi) in the lungs suddenly get swollen. The condition can make it hard to breathe. It can also cause these symptoms:  A cough.  Coughing up clear, yellow, or green mucus.  Wheezing.  Chest congestion.  Shortness of breath.  A fever.  Body aches.  Chills.  A sore throat.  Follow these instructions at home: Medicines  Take over-the-counter and prescription medicines only as told by your doctor.  If you were prescribed an antibiotic medicine, take it as told by your doctor. Do not stop taking the antibiotic even if you start to feel better. General instructions  Rest.  Drink enough fluids to keep your pee (urine) clear or pale yellow.  Avoid smoking and secondhand smoke. If you smoke and you need help quitting, ask your doctor. Quitting will help your lungs heal faster.  Use an inhaler, cool mist vaporizer, or humidifier as told  by your doctor.  Keep all follow-up visits as told by your doctor. This is important. How is this prevented? To lower your risk of getting this condition again:  Wash your hands often with soap and water. If you cannot use soap and water, use hand sanitizer.  Avoid contact with people who have cold symptoms.  Try not to touch your hands to your mouth, nose, or eyes.  Make sure to get the flu shot every year.  Contact a doctor if:  Your symptoms do not get better in 2 weeks. Get help right away if:  You cough up blood.  You have chest pain.  You have very bad shortness of breath.  You become dehydrated.  You faint (pass out) or keep feeling like you are going to pass out.  You keep throwing up (vomiting).  You have a very bad headache.  Your fever or chills gets worse. This information is not intended to replace advice given to you by your health care provider. Make sure you discuss any questions you have with your health care provider. Document Released: 02/16/2008 Document Revised: 04/07/2016 Document Reviewed: 02/18/2016 Elsevier Interactive Patient Education  [employer].

## 2018-08-16 NOTE — Progress Notes (Signed)
[name] 57 y.o.   Chief Complaint  Patient presents with  . Cough    per patient productive with reddish tan mucus and bloody started this morning  . Sore Throat    started 08/15/2018  . Irregular Heart Beat    in triage pulse 135    HISTORY OF PRESENT ILLNESS: This is a 57 y.o. female complaining of acute onset of cough sore throat, loss of voice, blood in the phlegm, headache, and heart skipping beats.  It started yesterday afternoon.  No other significant symptoms.  Cough  This is a new problem. The current episode started yesterday. The problem has been rapidly worsening. The problem occurs every few minutes. The cough is productive of sputum, productive of brown sputum and productive of bloody sputum. Associated symptoms include chills, a fever, headaches, hemoptysis, nasal congestion and a sore throat. Pertinent negatives include no chest pain, ear pain, eye redness, heartburn, myalgias, rash, shortness of breath, sweats or wheezing. Nothing aggravates the symptoms. She has tried nothing for the symptoms. There is no history of asthma, COPD, emphysema, environmental allergies or pneumonia. MVP     Prior to Admission medications   Medication Sig Start Date End Date Taking? Authorizing Provider  CALCIUM PO Take 2 tablets by mouth daily.   Yes [provider]  cholecalciferol (VITAMIN D) 1000 UNITS tablet Take 1,000 Units by mouth daily.    Yes [provider]  losartan (COZAAR) 25 MG tablet Take 25 mg by mouth daily.   Yes [provider]  metoprolol tartrate (LOPRESSOR) 25 MG tablet Take 25 mg by mouth 2 (two) times daily. 12/29/15  Yes [provider]  PARoxetine (PAXIL CR) 37.5 MG 24 hr tablet Take 1 tablet (37.5 mg total) by mouth daily. 05/10/18  Yes Danielle Ducking, MD  potassium chloride SA (K-DUR,KLOR-CON) 20 MEQ tablet TAKE 1 TABLET BY MOUTH TWICE DAILY 07/05/18  Yes Panosh, Standley Brooking, MD  TURMERIC PO Take by mouth daily.   Yes  [provider]  vitamin E (VITAMIN E) 400 UNIT capsule Take 400 Units by mouth daily.   Yes [provider]    Allergies  Allergen Reactions  . Ceftriaxone Sodium Itching  . Doxycycline Rash    Patient Active Problem List   Diagnosis Date Noted  . History of diverticulosis 05/04/2017  . Hypercalcemia 07/11/2013  . Hypocalcemia 07/05/2013  . Pes planus (flat feet) 03/29/2013  . History of PSVT (paroxysmal supraventricular tachycardia) 06/24/2012  . Hypokalemia 06/23/2012  . Hx of cardiac catheterization   . Positive PPD   . Multinodular goiter 05/12/2012  . SLEEP APNEA 01/19/2008  . Gastroesophageal reflux disease 01/18/2008  . DYSPEPSIA 01/18/2008  . IRRITABLE BOWEL SYNDROME 01/18/2008    Past Medical History:  Diagnosis Date  . Anemia   . Anxiety   . Colon polyp 08/28/2012   Tubular adenoma  . Dense breast   . Depression   . Genital warts   . GERD (gastroesophageal reflux disease)   . Heart murmur   . Hemorrhoids   . Hx of cardiac catheterization 2009   normal coronary arteries  . Hx of menorrhagia    Novasure  . Hypokalemia   . Hypomagnesemia   . Hypophosphatemia   . Lactose intolerance in adult   . Mitral valve prolapse   . Normal cardiac stress test 03/09/2012   no ischemia  . Positive PPD   . Rapid heart beat    States "irreg heart beat"  . Thyroid disease  Thyroid nodules    Past Surgical History:  Procedure Laterality Date  . CARDIAC CATHETERIZATION     x2 with normal results per pt  . NOVASURE ABLATION    . UPPER GASTROINTESTINAL ENDOSCOPY  May 2013    Social History   Socioeconomic History  . Marital status: Divorced    Spouse name: Not on file  . Number of children: 2  . Years of education: 56  . Highest education level: Not on file  Occupational History  . Occupation: dialysis Engineer, production: [employer]  Social Needs  . Financial resource strain: Not on file  . Food insecurity:    Worry: Not  on file    Inability: Not on file  . Transportation needs:    Medical: Not on file    Non-medical: Not on file  Tobacco Use  . Smoking status: Never Smoker  . Smokeless tobacco: Never Used  Substance and Sexual Activity  . Alcohol use: No  . Drug use: No  . Sexual activity: Yes  Lifestyle  . Physical activity:    Days per week: Not on file    Minutes per session: Not on file  . Stress: Not on file  Relationships  . Social connections:    Talks on phone: Not on file    Gets together: Not on file    Attends religious service: Not on file    Active member of club or organization: Not on file    Attends meetings of clubs or organizations: Not on file    Relationship status: Not on file  . Intimate partner violence:    Fear of current or ex partner: Not on file    Emotionally abused: Not on file    Physically abused: Not on file    Forced sexual activity: Not on file  Other Topics Concern  . Not on file  Social History Narrative   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 She lived in [place] and in [place] for a number of years her mom is from [place]. 16 years of education went to college at [employer] lives at home with her son who is in his 48s no pets   Neg ets tob etoh hx PA   6 hours of sleep   G2P2    TD2010  colonoscopy 2009   Now running  own business    HH of 2    Left handed    Caffeine use: tea sometimes          Family History  Problem Relation Age of Onset  . Heart disease Father        died Mi 54  . Hypertension Maternal Grandfather   . Diabetes Maternal Grandfather   . Heart disease Maternal Grandfather   . Colon cancer Maternal Grandfather   . Rectal cancer Maternal Grandfather   . Diabetes Maternal Grandmother   . Prostate cancer Neg Hx   . Breast cancer Neg Hx      Review of Systems  Constitutional: Positive for chills  and fever.  HENT: Positive for sore throat. Negative for ear pain.   Eyes: Negative.  Negative for discharge and redness.  Respiratory: Positive for cough and hemoptysis. Negative for shortness of breath and wheezing.   Cardiovascular: Positive for palpitations. Negative for chest pain.  Gastrointestinal: Negative.  Negative for abdominal pain, diarrhea,  heartburn, nausea and vomiting.  Genitourinary: Negative.  Negative for dysuria and hematuria.  Musculoskeletal: Negative for back pain, joint pain and myalgias.  Skin: Negative.  Negative for rash.  Neurological: Positive for headaches. Negative for dizziness, sensory change and weakness.  Endo/Heme/Allergies: Negative for environmental allergies.    Vitals:   08/16/18 1138  BP: 133/78  Pulse: (!) 135  Resp: 16  Temp: (!) 100.9 F (38.3 C)  SpO2: 96%   EKG: Sinus tachycardia at a rate of 110 with occasional PVCs.  No acute ischemic changes. Results for orders placed or performed in visit on 08/16/18 (from the past 24 hour(s))  POCT CBC     Status: Abnormal   Collection Time: 08/16/18 12:13 PM  Result Value Ref Range   WBC 11.9 (A) 4.6 - 10.2 K/uL   Lymph, poc 1.1 0.6 - 3.4   POC LYMPH PERCENT 9.0 (A) 10 - 50 %L   MID (cbc) 0.3 0 - 0.9   POC MID % 2.9 0 - 12 %M   POC Granulocyte 10.5 (A) 2 - 6.9   Granulocyte percent 88.1 (A) 37 - 80 %G   RBC 5.56 (A) 4.04 - 5.48 M/uL   Hemoglobin 13.5 9.5 - 13.5 g/dL   HCT, POC 41.5 (A) 29 - 41 %   MCV 74.6 (A) 76 - 111 fL   MCH, POC 24.2 (A) 27 - 31.2 pg   MCHC 32.5 31.8 - 35.4 g/dL   RDW, POC 14.3 %   Platelet Count, POC 233 142 - 424 K/uL   MPV 7.3 0 - 99.8 fL  POC Influenza A&B(BINAX/QUICKVUE)     Status: None   Collection Time: 08/16/18 12:21 PM  Result Value Ref Range   Influenza A, POC Negative Negative   Influenza B, POC Negative Negative   Dg Chest 2 View  Result Date: 08/16/2018 CLINICAL DATA:  Productive cough rule out pneumonia EXAM: CHEST - 2 VIEW COMPARISON:   10/27/2016 FINDINGS: Normal heart size. Lungs clear. No pneumothorax. No pleural effusion. IMPRESSION: No active cardiopulmonary disease. Electronically Signed   By: Marybelle Killings M.D.   On: 08/16/2018 12:30    Physical Exam  Constitutional: She is oriented to person, place, and time. She appears well-developed and well-nourished.  HENT:  Head: Normocephalic and atraumatic.  Right Ear: External ear normal.  Left Ear: External ear normal.  Nose: Nose normal.  Mouth/Throat: Uvula is midline. No uvula swelling. Posterior oropharyngeal erythema present. No oropharyngeal exudate, posterior oropharyngeal edema or tonsillar abscesses.  Eyes: Pupils are equal, round, and reactive to light. Conjunctivae and EOM are normal.  Neck: Normal range of motion. Neck supple. No thyromegaly present.  Cardiovascular:  Extrasystoles are present. Tachycardia present. Exam reveals no gallop.  No murmur heard. Pulmonary/Chest: Effort normal and breath sounds normal. No respiratory distress. She has no wheezes. She has no rales.  Abdominal: Soft. There is no tenderness.  Musculoskeletal: Normal range of motion. She exhibits no edema or tenderness.  Lymphadenopathy:    She has cervical adenopathy.  Neurological: She is alert and oriented to person, place, and time.  Skin: Skin is warm and dry. Capillary refill takes less than 2 seconds.  Psychiatric: She has a normal mood and affect. Her behavior is normal.  Vitals reviewed.  A total of 40 minutes was spent in the room with the patient, greater than 50% of which was in counseling/coordination of care regarding differential diagnosis, treatment, medications, blood results, chest x-ray results, prognosis and need for follow-up if  no better or worse.   ASSESSMENT & PLAN: [name] was seen today for cough, sore throat and irregular heart beat.  Diagnoses and all orders for this visit:  Cough -     POCT CBC -     POC Influenza A&B(BINAX/QUICKVUE) -     EKG  12-Lead -     DG Chest 2 View; Future -     benzonatate (TESSALON) 200 MG capsule; Take 1 capsule (200 mg total) by mouth 2 (two) times daily as needed for cough. -     promethazine-codeine (PHENERGAN WITH CODEINE) 6.25-10 MG/5ML syrup; Take 5 mLs by mouth at bedtime as needed for cough.  Fever and chills  Palpitations  History of mitral valve prolapse  Lower respiratory infection -     amoxicillin-clavulanate (AUGMENTIN) 875-125 MG tablet; Take 1 tablet by mouth 2 (two) times daily for 7 days.    Patient Instructions       If you have lab work done today you will be contacted with your lab results within the next 2 weeks.  If you have not heard from [place] then please contact us. The fastest way to get your results is to register for My Chart.   IF you received an x-ray today, you will receive an invoice from [facility]. Please contact [facility] at [phone] with questions or concerns regarding your invoice.   IF you received labwork today, you will receive an invoice from [facility]. Please contact [facility] at [phone] with questions or concerns regarding your invoice.   Our billing staff will not be able to assist you with questions regarding bills from these companies.  You will be contacted with the lab results as soon as they are available. The fastest way to get your results is to activate your My Chart account. Instructions are located on the last page of this paperwork. If you have not heard from [place] regarding the results in 2 weeks, please contact this office.     Acute Bronchitis, Adult Acute bronchitis is when air tubes (bronchi) in the lungs suddenly get swollen. The condition can make it hard to breathe. It can also cause these symptoms:  A cough.  Coughing up clear, yellow, or green mucus.  Wheezing.  Chest congestion.  Shortness of breath.  A fever.  Body aches.  Chills.  A sore throat.  Follow these instructions at  home: Medicines  Take over-the-counter and prescription medicines only as told by your doctor.  If you were prescribed an antibiotic medicine, take it as told by your doctor. Do not stop taking the antibiotic even if you start to feel better. General instructions  Rest.  Drink enough fluids to keep your pee (urine) clear or pale yellow.  Avoid smoking and secondhand smoke. If you smoke and you need help quitting, ask your doctor. Quitting will help your lungs heal faster.  Use an inhaler, cool mist vaporizer, or humidifier as told by your doctor.  Keep all follow-up visits as told by your doctor. This is important. How is this prevented? To lower your risk of getting this condition again:  Wash your hands often with soap and water. If you cannot use soap and water, use hand sanitizer.  Avoid contact with people who have cold symptoms.  Try not to touch your hands to your mouth, nose, or eyes.  Make sure to get the flu shot every year.  Contact a doctor if:  Your symptoms do not get better in 2 weeks. Get help  right away if:  You cough up blood.  You have chest pain.  You have very bad shortness of breath.  You become dehydrated.  You faint (pass out) or keep feeling like you are going to pass out.  You keep throwing up (vomiting).  You have a very bad headache.  Your fever or chills gets worse. This information is not intended to replace advice given to you by your health care provider. Make sure you discuss any questions you have with your health care provider. Document Released: 02/16/2008 Document Revised: 04/07/2016 Document Reviewed: 02/18/2016 Elsevier Interactive Patient Education  2018 Elsevier Inc.      Agustina Caroli, MD [facility]

## 2018-08-31 DIAGNOSIS — F331 Major depressive disorder, recurrent, moderate: Secondary | ICD-10-CM | POA: Diagnosis not present

## 2018-08-31 DIAGNOSIS — F411 Generalized anxiety disorder: Secondary | ICD-10-CM | POA: Diagnosis not present

## 2018-08-31 DIAGNOSIS — F4310 Post-traumatic stress disorder, unspecified: Secondary | ICD-10-CM | POA: Diagnosis not present

## 2018-09-05 ENCOUNTER — Ambulatory Visit (INDEPENDENT_AMBULATORY_CARE_PROVIDER_SITE_OTHER): Payer: BLUE CROSS/BLUE SHIELD

## 2018-09-05 ENCOUNTER — Ambulatory Visit (HOSPITAL_COMMUNITY)
Admission: EM | Admit: 2018-09-05 | Discharge: 2018-09-05 | Disposition: A | Discharge status: Home / Self Care | Payer: BLUE CROSS/BLUE SHIELD | Attending: Family Medicine | Admitting: Family Medicine

## 2018-09-05 ENCOUNTER — Encounter (HOSPITAL_COMMUNITY): Payer: Self-pay | Admitting: Emergency Medicine

## 2018-09-05 DIAGNOSIS — J22 Unspecified acute lower respiratory infection: Secondary | ICD-10-CM | POA: Insufficient documentation

## 2018-09-05 DIAGNOSIS — R05 Cough: Secondary | ICD-10-CM | POA: Diagnosis not present

## 2018-09-05 MED ORDER — AZITHROMYCIN 250 MG PO TABS: 250.0000 mg | ORAL_TABLET | Freq: Every day | ORAL | 0 refills | Status: DC

## 2018-09-05 MED ORDER — CETIRIZINE HCL 10 MG PO CAPS: 10.0000 mg | ORAL_CAPSULE | Freq: Every day | ORAL | 0 refills | Status: DC

## 2018-09-05 MED ORDER — PREDNISONE 50 MG PO TABS: 50.0000 mg | ORAL_TABLET | Freq: Every day | ORAL | 0 refills | Status: AC

## 2018-09-05 NOTE — Discharge Instructions (Signed)
NO pneumonia on xray I am still going to cover you for a atypical respiratory illness- begin azithromycin two tablets today, 1 tablet for the following 4 days Begin prednisone daily for the next 5 days, please take with food on your stomach in the morning if you are able Begin taking daily cetirizine to help with any congestion drainage that is contributing to your sore throat Please continue to use your Robitussin with codeine as needed for cough  Please continue to monitor temperature, breathing and symptoms, please follow-up if symptoms not resolving with recommendations above, worsening, developing shortness of breath or fevers

## 2018-09-05 NOTE — ED Triage Notes (Signed)
Pt here for cough x 3 weeks with some blood tinged sputum

## 2018-09-07 NOTE — ED Provider Notes (Signed)
[facility]    CSN: [id number] Arrival date & time: 09/05/18  1051     History   Chief Complaint Chief Complaint  Patient presents with  . Cough    HPI [name] is a 57 y.o. female history of GERD, IBS, presenting today for evaluation of cough.  Patient has had a cough for the past 3 weeks.  Symptoms began early December.  At the beginning she had associated congestion and sore throat.  She was seen by her PCP and put on Augmentin as well as cough syrup with codeine.  She has not had much improvement.  She is also tried Mucinex DM, ginger and honey tea.  Cough has become less productive.  She has noticed occasional chills.  Denies any fevers.  Denies chest pain.   HPI  Past Medical History:  Diagnosis Date  . Anemia   . Anxiety   . Colon polyp 08/28/2012   Tubular adenoma  . Dense breast   . Depression   . Genital warts   . GERD (gastroesophageal reflux disease)   . Heart murmur   . Hemorrhoids   . Hx of cardiac catheterization 2009   normal coronary arteries  . Hx of menorrhagia    Novasure  . Hypokalemia   . Hypomagnesemia   . Hypophosphatemia   . Lactose intolerance in adult   . Mitral valve prolapse   . Normal cardiac stress test 03/09/2012   no ischemia  . Positive PPD   . Rapid heart beat    States "irreg heart beat"  . Thyroid disease    Thyroid nodules    Patient Active Problem List   Diagnosis Date Noted  . Cough 08/16/2018  . Fever and chills 08/16/2018  . Lower respiratory infection 08/16/2018  . History of diverticulosis 05/04/2017  . Hypercalcemia 07/11/2013  . Hypocalcemia 07/05/2013  . Palpitations 07/05/2013  . Pes planus (flat feet) 03/29/2013  . History of PSVT (paroxysmal supraventricular tachycardia) 06/24/2012  . Hypokalemia 06/23/2012  . Hx of cardiac catheterization   . Positive PPD   . Multinodular goiter 05/12/2012  . SLEEP APNEA 01/19/2008  . Gastroesophageal reflux disease 01/18/2008  . DYSPEPSIA  01/18/2008  . IRRITABLE BOWEL SYNDROME 01/18/2008    Past Surgical History:  Procedure Laterality Date  . CARDIAC CATHETERIZATION     x2 with normal results per pt  . NOVASURE ABLATION    . UPPER GASTROINTESTINAL ENDOSCOPY  May 2013    OB History   No obstetric history on file.      Home Medications    Prior to Admission medications   Medication Sig Start Date End Date Taking? Authorizing Provider  azithromycin (ZITHROMAX) 250 MG tablet Take 1 tablet (250 mg total) by mouth daily. Take first 2 tablets together, then 1 every day until finished. 09/05/18   Demarcus Thielke C, PA-C  benzonatate (TESSALON) 200 MG capsule Take 1 capsule (200 mg total) by mouth 2 (two) times daily as needed for cough. 08/16/18   Horald Pollen, MD  CALCIUM PO Take 2 tablets by mouth daily.    [provider]  Cetirizine HCl 10 MG CAPS Take 1 capsule (10 mg total) by mouth daily for 10 days. 09/05/18 09/15/18  Genavieve Mangiapane C, PA-C  cholecalciferol (VITAMIN D) 1000 UNITS tablet Take 1,000 Units by mouth daily.     [provider]  losartan (COZAAR) 25 MG tablet Take 25 mg by mouth daily.    [provider]  metoprolol tartrate (  LOPRESSOR) 25 MG tablet Take 25 mg by mouth 2 (two) times daily. 12/29/15   [provider]  PARoxetine (PAXIL CR) 37.5 MG 24 hr tablet Take 1 tablet (37.5 mg total) by mouth daily. 05/10/18   Kathrynn Ducking, MD  potassium chloride SA (K-DUR,KLOR-CON) 20 MEQ tablet TAKE 1 TABLET BY MOUTH TWICE DAILY 07/05/18   Panosh, Standley Brooking, MD  predniSONE (DELTASONE) 50 MG tablet Take 1 tablet (50 mg total) by mouth daily for 5 days. 09/05/18 09/10/18  Doren Kaspar C, PA-C  promethazine-codeine (PHENERGAN WITH CODEINE) 6.25-10 MG/5ML syrup Take 5 mLs by mouth at bedtime as needed for cough. 08/16/18   Horald Pollen, MD  TURMERIC PO Take by mouth daily.    [provider]  vitamin E (VITAMIN E) 400 UNIT capsule Take 400 Units by mouth  daily.    [provider]    Family History Family History  Problem Relation Age of Onset  . Heart disease Father        died Mi 68  . Hypertension Maternal Grandfather   . Diabetes Maternal Grandfather   . Heart disease Maternal Grandfather   . Colon cancer Maternal Grandfather   . Rectal cancer Maternal Grandfather   . Diabetes Maternal Grandmother   . Prostate cancer Neg Hx   . Breast cancer Neg Hx     Social History Social History   Tobacco Use  . Smoking status: Never Smoker  . Smokeless tobacco: Never Used  Substance Use Topics  . Alcohol use: No  . Drug use: No     Allergies   Ceftriaxone sodium and Doxycycline   Review of Systems Review of Systems  Constitutional: Positive for chills. Negative for activity change, appetite change, fatigue and fever.  HENT: Positive for congestion, rhinorrhea and sinus pressure. Negative for ear pain, sore throat and trouble swallowing.   Eyes: Negative for discharge and redness.  Respiratory: Positive for cough. Negative for chest tightness and shortness of breath.   Cardiovascular: Negative for chest pain.  Gastrointestinal: Negative for abdominal pain, diarrhea, nausea and vomiting.  Musculoskeletal: Negative for myalgias.  Skin: Negative for rash.  Neurological: Negative for dizziness, light-headedness and headaches.     Physical Exam Triage Vital Signs ED Triage Vitals  Enc Vitals Group     BP 09/05/18 1129 (!) 111/55     Pulse Rate 09/05/18 1129 (!) 58     Resp 09/05/18 1129 16     Temp 09/05/18 1129 97.8 F (36.6 C)     Temp Source 09/05/18 1129 Oral     SpO2 09/05/18 1129 98 %     Weight --      Height --      Head Circumference --      Peak Flow --      Pain Score 09/05/18 1130 0     Pain Loc --      Pain Edu? --      Excl. in [place]? --    No data found.  Updated Vital Signs BP (!) 111/55 (BP Location: Left Arm)   Pulse (!) 58   Temp 97.8 F (36.6 C) (Oral)   Resp 16   LMP 09/18/2016  (Exact Date)   SpO2 98%   Visual Acuity Right Eye Distance:   Left Eye Distance:   Bilateral Distance:    Right Eye Near:   Left Eye Near:    Bilateral Near:     Physical Exam Vitals signs and nursing note reviewed.  Constitutional:      General: She is not in acute distress.    Appearance: She is well-developed.  HENT:     Head: Normocephalic and atraumatic.     Ears:     Comments: Bilateral ears without tenderness to palpation of external auricle, tragus and mastoid, EAC's without erythema or swelling, TM's with good bony landmarks and cone of light. Non erythematous.    Mouth/Throat:     Comments: Oral mucosa pink and moist, no tonsillar enlargement or exudate. Posterior pharynx patent and nonerythematous, no uvula deviation or swelling. Normal phonation. Eyes:     Conjunctiva/sclera: Conjunctivae normal.  Neck:     Musculoskeletal: Neck supple.  Cardiovascular:     Rate and Rhythm: Normal rate and regular rhythm.     Heart sounds: No murmur.  Pulmonary:     Effort: Pulmonary effort is normal. No respiratory distress.     Breath sounds: Normal breath sounds.     Comments: Breathing comfortably at rest, CTABL, no wheezing, rales or other adventitious sounds auscultated Abdominal:     Palpations: Abdomen is soft.     Tenderness: There is no abdominal tenderness.  Skin:    General: Skin is warm and dry.  Neurological:     Mental Status: She is alert.      UC Treatments / Results  Labs (all labs ordered are listed, but only abnormal results are displayed) Labs Reviewed - No data to display  EKG None  Radiology No results found.  Procedures Procedures (including critical care time)  Medications Ordered in UC Medications - No data to display  Initial Impression / Assessment and Plan / UC Course  I have reviewed the triage vital signs and the nursing notes.  Pertinent labs & imaging results that were available during my care of the patient were reviewed by  me and considered in my medical decision making (see chart for details).     Chest x-ray is negative for pneumonia.  Will still cover for atypical respiratory illness with azithromycin.  Trial of prednisone.  Zyrtec for any postnasal drainage.  Continue cough syrup as needed.Discussed strict return precautions. Patient verbalized understanding and is agreeable with plan.  Final Clinical Impressions(s) / UC Diagnoses   Final diagnoses:  Lower respiratory infection (e.g., bronchitis, pneumonia, pneumonitis, pulmonitis)     Discharge Instructions     NO pneumonia on xray I am still going to cover you for a atypical respiratory illness- begin azithromycin two tablets today, 1 tablet for the following 4 days Begin prednisone daily for the next 5 days, please take with food on your stomach in the morning if you are able Begin taking daily cetirizine to help with any congestion drainage that is contributing to your sore throat Please continue to use your Robitussin with codeine as needed for cough  Please continue to monitor temperature, breathing and symptoms, please follow-up if symptoms not resolving with recommendations above, worsening, developing shortness of breath or fevers   ED Prescriptions    Medication Sig Dispense Auth. Provider   azithromycin (ZITHROMAX) 250 MG tablet Take 1 tablet (250 mg total) by mouth daily. Take first 2 tablets together, then 1 every day until finished. 6 tablet Abree Romick C, PA-C   predniSONE (DELTASONE) 50 MG tablet Take 1 tablet (50 mg total) by mouth daily for 5 days. 5 tablet Mickala Laton C, PA-C   Cetirizine HCl 10 MG CAPS Take 1 capsule (10 mg total) by mouth daily for 10 days. 10 capsule  Raman Featherston, [place] C, PA-C     Controlled Substance Prescriptions [place] Controlled Substance Registry consulted? Not Applicable   Janith Lima, [place] 09/07/18 1323

## 2018-09-12 ENCOUNTER — Encounter (HOSPITAL_COMMUNITY): Payer: Self-pay | Admitting: Emergency Medicine

## 2018-09-12 ENCOUNTER — Ambulatory Visit (HOSPITAL_COMMUNITY)
Admission: EM | Admit: 2018-09-12 | Discharge: 2018-09-12 | Disposition: A | Discharge status: Home / Self Care | Payer: BLUE CROSS/BLUE SHIELD | Attending: Family Medicine | Admitting: Family Medicine

## 2018-09-12 DIAGNOSIS — R197 Diarrhea, unspecified: Secondary | ICD-10-CM

## 2018-09-12 MED ORDER — ONDANSETRON 4 MG PO TBDP: 4.0000 mg | ORAL_TABLET | Freq: Three times a day (TID) | ORAL | 0 refills | Status: DC | PRN

## 2018-09-12 MED ORDER — FAMOTIDINE 20 MG PO TABS: 20.0000 mg | ORAL_TABLET | Freq: Two times a day (BID) | ORAL | 0 refills | Status: DC

## 2018-09-12 NOTE — ED Triage Notes (Signed)
Pt here with diarrhea since taking antibiotics with some abd cramping

## 2018-09-12 NOTE — Discharge Instructions (Addendum)
Stay on clear liquids then advance to the diet for diarrhea. Follow up with your doctor. Return here as needed.

## 2018-09-12 NOTE — ED Provider Notes (Signed)
[place]    CSN: [id number] Arrival date & time: 09/12/18  1051     History   Chief Complaint Chief Complaint  Patient presents with  . Diarrhea    HPI [name] is a 57 y.o. female who presents to the UC with diarrhea. Patient reports the diarrhea started after taking antibiotics. Patient reports everything started about a month ago when she had a URI and started her on Augmentin. After taking that for 14 days she got better for a few days and then got a sore throat and cough. Patient reports coming to the UC and being treated with Zithromax and cough medication and prednisone. Patient reports finishing the antibiotics 3 days and started having diarrhea just before finishing the antibiotic. She reports abdominal cramping and nausea.   HPI  Past Medical History:  Diagnosis Date  . Anemia   . Anxiety   . Colon polyp 08/28/2012   Tubular adenoma  . Dense breast   . Depression   . Genital warts   . GERD (gastroesophageal reflux disease)   . Heart murmur   . Hemorrhoids   . Hx of cardiac catheterization 2009   normal coronary arteries  . Hx of menorrhagia    Novasure  . Hypokalemia   . Hypomagnesemia   . Hypophosphatemia   . Lactose intolerance in adult   . Mitral valve prolapse   . Normal cardiac stress test 03/09/2012   no ischemia  . Positive PPD   . Rapid heart beat    States "irreg heart beat"  . Thyroid disease    Thyroid nodules    Patient Active Problem List   Diagnosis Date Noted  . Cough 08/16/2018  . Fever and chills 08/16/2018  . Lower respiratory infection 08/16/2018  . History of diverticulosis 05/04/2017  . Hypercalcemia 07/11/2013  . Hypocalcemia 07/05/2013  . Palpitations 07/05/2013  . Pes planus (flat feet) 03/29/2013  . History of PSVT (paroxysmal supraventricular tachycardia) 06/24/2012  . Hypokalemia 06/23/2012  . Hx of cardiac catheterization   . Positive PPD   . Multinodular goiter 05/12/2012  . SLEEP APNEA  01/19/2008  . Gastroesophageal reflux disease 01/18/2008  . DYSPEPSIA 01/18/2008  . IRRITABLE BOWEL SYNDROME 01/18/2008    Past Surgical History:  Procedure Laterality Date  . CARDIAC CATHETERIZATION     x2 with normal results per pt  . NOVASURE ABLATION    . UPPER GASTROINTESTINAL ENDOSCOPY  May 2013    OB History   No obstetric history on file.      Home Medications    Prior to Admission medications   Medication Sig Start Date End Date Taking? Authorizing Provider  benzonatate (TESSALON) 200 MG capsule Take 1 capsule (200 mg total) by mouth 2 (two) times daily as needed for cough. 08/16/18   Horald Pollen, MD  CALCIUM PO Take 2 tablets by mouth daily.    [provider]  Cetirizine HCl 10 MG CAPS Take 1 capsule (10 mg total) by mouth daily for 10 days. 09/05/18 09/15/18  Wieters, Hallie C, PA-C  cholecalciferol (VITAMIN D) 1000 UNITS tablet Take 1,000 Units by mouth daily.     [provider]  famotidine (PEPCID) 20 MG tablet Take 1 tablet (20 mg total) by mouth 2 (two) times daily. 09/12/18   Ashley Murrain, NP  losartan (COZAAR) 25 MG tablet Take 25 mg by mouth daily.    [provider]  metoprolol tartrate (LOPRESSOR) 25 MG tablet Take 25 mg by mouth  2 (two) times daily. 12/29/15   [provider]  ondansetron (ZOFRAN ODT) 4 MG disintegrating tablet Take 1 tablet (4 mg total) by mouth every 8 (eight) hours as needed for nausea or vomiting. 09/12/18   Ashley Murrain, NP  PARoxetine (PAXIL CR) 37.5 MG 24 hr tablet Take 1 tablet (37.5 mg total) by mouth daily. 05/10/18   Kathrynn Ducking, MD  potassium chloride SA (K-DUR,KLOR-CON) 20 MEQ tablet TAKE 1 TABLET BY MOUTH TWICE DAILY 07/05/18   Panosh, Standley Brooking, MD  promethazine-codeine (PHENERGAN WITH CODEINE) 6.25-10 MG/5ML syrup Take 5 mLs by mouth at bedtime as needed for cough. 08/16/18   Horald Pollen, MD  TURMERIC PO Take by mouth daily.    [provider]  vitamin E  (VITAMIN E) 400 UNIT capsule Take 400 Units by mouth daily.    [provider]    Family History Family History  Problem Relation Age of Onset  . Heart disease Father        died Mi 108  . Hypertension Maternal Grandfather   . Diabetes Maternal Grandfather   . Heart disease Maternal Grandfather   . Colon cancer Maternal Grandfather   . Rectal cancer Maternal Grandfather   . Diabetes Maternal Grandmother   . Prostate cancer Neg Hx   . Breast cancer Neg Hx     Social History Social History   Tobacco Use  . Smoking status: Never Smoker  . Smokeless tobacco: Never Used  Substance Use Topics  . Alcohol use: No  . Drug use: No     Allergies   Ceftriaxone sodium and Doxycycline   Review of Systems Review of Systems  Constitutional: Negative for chills and fever.  HENT: Positive for congestion.   Eyes: Negative for redness.  Respiratory: Positive for cough (occasional).   Gastrointestinal: Positive for diarrhea and nausea. Negative for vomiting. Abdominal pain: cramping.  Genitourinary: Negative for dysuria and frequency.  Musculoskeletal: Negative for myalgias.  Skin: Negative for pallor.  Neurological: Negative for headaches.  Psychiatric/Behavioral: Negative for confusion.     Physical Exam Triage Vital Signs ED Triage Vitals  Enc Vitals Group     BP 09/12/18 1235 124/77     Pulse Rate 09/12/18 1235 62     Resp 09/12/18 1235 18     Temp 09/12/18 1235 97.9 F (36.6 C)     Temp Source 09/12/18 1235 Oral     SpO2 09/12/18 1235 100 %     Weight --      Height --      Head Circumference --      Peak Flow --      Pain Score 09/12/18 1236 4     Pain Loc --      Pain Edu? --      Excl. in [place]? --    No data found.  Updated Vital Signs BP 124/77 (BP Location: Left Arm)   Pulse 62   Temp 97.9 F (36.6 C) (Oral)   Resp 18   LMP 09/18/2016 (Exact Date)   SpO2 100%   Visual Acuity Right Eye Distance:   Left Eye Distance:   Bilateral Distance:      Right Eye Near:   Left Eye Near:    Bilateral Near:     Physical Exam Vitals signs and nursing note reviewed.  Constitutional:      General: She is not in acute distress.    Appearance: She is well-developed and normal weight.  HENT:  Head: Normocephalic.     Right Ear: Tympanic membrane normal.     Left Ear: Tympanic membrane normal.     Nose: Congestion present.     Mouth/Throat:     Mouth: Mucous membranes are moist.     Pharynx: Oropharynx is clear.  Eyes:     Extraocular Movements: Extraocular movements intact.     Conjunctiva/sclera: Conjunctivae normal.  Neck:     Musculoskeletal: Neck supple.  Cardiovascular:     Rate and Rhythm: Normal rate and regular rhythm.  Pulmonary:     Effort: Pulmonary effort is normal.     Breath sounds: No wheezing or rales.  Abdominal:     General: Bowel sounds are normal.     Palpations: Abdomen is soft.     Tenderness: There is abdominal tenderness.     Comments: Patient reports "soreness" to the lower abdomen since diarrhea stools. No rebound, no guarding.   Musculoskeletal: Normal range of motion.  Skin:    General: Skin is warm and dry.  Neurological:     Mental Status: She is alert and oriented to person, place, and time.     Cranial Nerves: No cranial nerve deficit.  Psychiatric:        Mood and Affect: Mood normal.      UC Treatments / Results  Labs (all labs ordered are listed, but only abnormal results are displayed) Labs Reviewed - No data to display Radiology No results found.  Procedures Procedures (including critical care time)  Medications Ordered in UC Medications - No data to display  Initial Impression / Assessment and Plan / UC Course  I have reviewed the triage vital signs and the nursing notes. 57 y.o. female here with diarrhea s/p antibiotics stable for d/c without fever and does not appear toxic. Will treat with Pepcid and patient to f/u with PCP.   Final Clinical Impressions(s) / UC  Diagnoses   Final diagnoses:  Diarrhea in adult patient     Discharge Instructions     Stay on clear liquids then advance to the diet for diarrhea. Follow up with your doctor. Return here as needed.     ED Prescriptions    Medication Sig Dispense Auth. Provider   famotidine (PEPCID) 20 MG tablet Take 1 tablet (20 mg total) by mouth 2 (two) times daily. 30 tablet [place], [place] M, NP   ondansetron (ZOFRAN ODT) 4 MG disintegrating tablet Take 1 tablet (4 mg total) by mouth every 8 (eight) hours as needed for nausea or vomiting. 20 tablet Ashley Murrain, NP     Controlled Substance Prescriptions [place] Controlled Substance Registry consulted? Not Applicable   Ashley Murrain, NP 09/12/18 1320

## 2018-10-03 ENCOUNTER — Telehealth: Payer: Self-pay | Admitting: *Deleted

## 2018-10-03 ENCOUNTER — Other Ambulatory Visit: Payer: Self-pay | Admitting: Neurology

## 2018-10-03 NOTE — Telephone Encounter (Signed)
PA for Paroxetine 37.5mg  tablets, #3030 completed via Cover My Meds. Key# [id number]. Dx: Anxiety F41.9. No tried and faileds, but memory difficulty due to anxiety/depression is currently staple on Paroxetine and she tolerates it well/fim

## 2018-10-06 NOTE — Addendum Note (Signed)
Addended by: Lester Webb A on: 10/06/2018 09:30 AM   Modules accepted: Orders

## 2018-10-06 NOTE — Telephone Encounter (Signed)
Received a denial from [facility] for pt's paxil 37.5mg . It was denied because pt has not tried 2 of the following: fluoxetine, paroxetine, sertraline, mirtazapine, citalopram, duloxetine, fluvoxamine, venlafaxine, bupropion.  I called pt. She reports that she could not tolerate the higher dose of paxil and her mental health provider is prescribing another dose of paxil for her.  Pt reports that she never heard from neuro rehab regarding Dr. Jannifer Franklin' referral for her. I gave her neuro rehab's phone number and asked her to call them to schedule that appt. Pt verbalized understanding.

## 2018-10-09 ENCOUNTER — Other Ambulatory Visit: Payer: Self-pay

## 2018-10-09 ENCOUNTER — Ambulatory Visit: Payer: BLUE CROSS/BLUE SHIELD | Attending: Neurology | Admitting: Speech Pathology

## 2018-10-09 DIAGNOSIS — F331 Major depressive disorder, recurrent, moderate: Secondary | ICD-10-CM | POA: Diagnosis not present

## 2018-10-09 DIAGNOSIS — F4310 Post-traumatic stress disorder, unspecified: Secondary | ICD-10-CM | POA: Diagnosis not present

## 2018-10-09 DIAGNOSIS — R41841 Cognitive communication deficit: Secondary | ICD-10-CM | POA: Diagnosis not present

## 2018-10-09 DIAGNOSIS — F411 Generalized anxiety disorder: Secondary | ICD-10-CM | POA: Diagnosis not present

## 2018-10-09 NOTE — Patient Instructions (Signed)
Your cognitive testing reveals mild impairment in memory. Your scores for attention, executive function, language, visuospatial skills and clock drawing are within normal limits for your age (18-69). I did feel that at times anxiety might have affected your performance. I recommend following up with Dr. Jannifer Franklin or your primary care doctor to determine if medication changes might be helpful.   Here are some tips of way to help your memory and attention:   Using a Calendar System to Help Your Memory  . Carry your calendar with you at all times . Decide on a place for your calendar o Choose a place to keep your calendar at home o Put it back in the same place when you finish recording information or when you return home . Review your calendar with someone. o It may be helpful for someone to give you advice about information to record.  . Record information at the time it occurs.  o Don't wait until later to write down information o Ask others for a moment to record in your calendar . Review your calendar at least twice a day. o Morning: check plans, write "To Do" list o Evening: review day, make notes, plan for next day, transfer incomplete tasks to another day . Write in all appointments. o Eg. doctor, dentist, school, hairdresser . Write in weekly scheduled events. o Record things you do each week such as: your therapy schedule, exercise class, garbage day, Sunday school o Add scheduled events for things you have trouble remembering, such as: completing exercises, making phone calls, reviewing your budget . Write in upcoming events. o Birthdays, holidays, family activities, social events, bills due . Write a "To Do" list every day. o Include items you need to do and items you are asked to do o Check items off ONLY after you do them o Record things you can achieve on that day. Look to another day if you are too busy. . Record details from your day. o Write down information from  conversations so you can follow up later. o Record information from appointments so you can remember what was said. [name] Reference your calendar throughout the day. o Check off items on your "To Do" list o Check your schedule . Plan out larger tasks. o Break down into steps and write in your calendar to make the task more manageable . Review your calendar every few days. o Make sure you have transferred "To-Do" items have not completed to another day o Review the events you've recording and try to remember some details . Review your calendar at the end of the month. o Copy events to the next month.    Strategies for Improving Your Attention and Memory  Use good eye-contact  Give the speaker your undivided attention  Look directly at the speaker  Complete one task at a time  Avoid multitasking  Complete one task before starting a new one  Write a note to yourself if you think of something else that needs to be done  Let others know when you need quiet time and can't be interrupted  Don't answer the phone, texts, or emails while you are working on another task  Put aside distracting thoughts  If you find your mind wandering, refocus your attention on the speaker  Avoid off-topic comments or responses that may divert your attention   If something important comes to mind, let the speaker know and pause to write yourself a note: "Do you mind holding on a minute,  I have to write something down."  Put thoughts on hold and focus on salient information  Limit distractions in your environment  Think about the environment around you  Limit background noise by turning off the TV or music, putting your phone away  Close the door and work in quiet  Use active listening  Actively participate in the conversation to stay focused  Paraphrase what you have heard to include the most important details  Adding some associations may help you remember  Ask questions to clarify  certain points  Summarize the speaker's comments periodically  Avoid nodding your head and using "mhm" responses as these are more passive and don't help your attention  Alert the other person/people  It may be helpful to alert your listener to the fact that you may need reminders to keep on track  Tell the speaker in advance that you may need to stop them and have them repeat salient information  If you lose focus, interject and let the person know, "I'm sorry, I lost you, can you tell me again?"  Write down information  Write down pertinent information as it comes up, such as telephone numbers, names of people, addresses, details from appointments and conversations, etc.

## 2018-10-09 NOTE — Therapy (Signed)
[facility] [address] [place], [place], [address] Phone: [phone]   Fax:  [phone]  Speech Language Pathology Evaluation  Patient Details  Name: [name] MRN: [id number] Date of Birth: 1961-02-11 Referring Provider (SLP): Dr. Margette Fast   Encounter Date: 10/09/2018  End of Session - 10/09/18 1509    Visit Number  1    Number of Visits  1    Date for SLP Re-Evaluation  10/09/18    Authorization Type  [employer]    SLP Start Time  6269    SLP Stop Time   4854    SLP Time Calculation (min)  50 min    Activity Tolerance  Patient tolerated treatment well       Past Medical History:  Diagnosis Date  . Anemia   . Anxiety   . Colon polyp 08/28/2012   Tubular adenoma  . Dense breast   . Depression   . Genital warts   . GERD (gastroesophageal reflux disease)   . Heart murmur   . Hemorrhoids   . Hx of cardiac catheterization 2009   normal coronary arteries  . Hx of menorrhagia    Novasure  . Hypokalemia   . Hypomagnesemia   . Hypophosphatemia   . Lactose intolerance in adult   . Mitral valve prolapse   . Normal cardiac stress test 03/09/2012   no ischemia  . Positive PPD   . Rapid heart beat    States "irreg heart beat"  . Thyroid disease    Thyroid nodules    Past Surgical History:  Procedure Laterality Date  . CARDIAC CATHETERIZATION     x2 with normal results per pt  . NOVASURE ABLATION    . UPPER GASTROINTESTINAL ENDOSCOPY  May 2013    There were no vitals filed for this visit.  Subjective Assessment - 10/09/18 1412    Subjective  "Dr Jannifer Franklin told me I needed to be referred."    Currently in Pain?  No/denies         SLP Evaluation [facility] - 10/09/18 1412      SLP Visit Information   SLP Received On  10/09/18    Referring Provider (SLP)  Dr. Margette Fast    Onset Date  05/10/18    Medical Diagnosis  Memory disturbance      Subjective   Subjective  Pt reports difficulty  focusing, memory      General Information   HPI  [name] is a 58 year old left-handed female with a history of a memory disturbance that she claims has been present since around 2007 or 2008. Hx noted for anxiety, depression. MRI 05/17/18 unremarkable; MD notes indicate anxiety, disorganization may be cause for cognitive dysfunction.    Behavioral/Cognition  alert, pleasant    Mobility Status  ambulated to session      Balance Screen   Has the patient fallen in the past 6 months  No    Has the patient had a decrease in activity level because of a fear of falling?   No    Is the patient reluctant to leave their home because of a fear of falling?   No      Prior Functional Status   Cognitive/Linguistic Baseline  Baseline deficits    Baseline deficit details  difficulty focusing (pt reports improved with increase in Paxil), memory    Type of Home  House    Available Support  Family;Friend(s)    Education  BA  Vocation  Full time employment   runs non-emergency medical transport business     Cognition   Overall Cognitive Status  History of cognitive impairments - at baseline    Attention  Alternating    Alternating Attention  Impaired    Alternating Attention Impairment  Verbal complex;Functional complex   symbol trails (5/10), distracted by her phone   Memory  Impaired    Memory Impairment  Decreased recall of new information;Storage deficit   attention, anxiety contribute   Awareness  Appears intact    Problem Solving  Appears intact    Executive Function  Reasoning;Organizing    Reasoning  Appears intact    Organizing  Appears intact    Behaviors  Impulsive;Other (comment)   anxious     Auditory Comprehension   Overall Auditory Comprehension  Appears within functional limits for tasks assessed      Visual Recognition/Discrimination   Discrimination  Within Function Limits      Reading Comprehension   Reading Status  Within funtional limits      Expression   Primary  Mode of Expression  Verbal      Verbal Expression   Overall Verbal Expression  Appears within functional limits for tasks assessed    Initiation  No impairment    Automatic Speech  Name;Social Response    Level of Generative/Spontaneous Verbalization  Conversation      Written Expression   Dominant Hand  Left    Written Expression  Not tested      Oral Motor/Sensory Function   Overall Oral Motor/Sensory Function  Appears within functional limits for tasks assessed      Motor Speech   Overall Motor Speech  Appears within functional limits for tasks assessed      Standardized Assessments   Standardized Assessments   Cognitive Linguistic Quick Test      Cognitive Linguistic Quick Test (Ages 18-69)   Attention  WNL    Memory  Mild    Executive Function  WNL    Language  WNL    Visuospatial Skills  WNL    Severity Rating Total  19    Composite Severity Rating  15.8                      SLP Education - 10/09/18 1528    Education Details  cognitive testing results, use of a calendar, attention/memory strategies, follow up with PCP/Dr. Jannifer Franklin re: managing anxiety    Person(s) Educated  Patient    Methods  Explanation;Handout    Comprehension  Verbalized understanding           Plan - 10/09/18 1529    Clinical Impression Statement  [name] presents with what appear to be overall mild deficits in attention and memory, suspect secondary to anxiety as pt does not have neurologic diagnosis and her MRI was unremarkable. She scored within normal limits in domains of attention, executive function, visuospatial skills and language, and mild impairment in memory per Cognitive Linguistic Quick Test (CLQT). Pt was occasionally impulsive, and noted to question her performance frequently throughout testing. Despite scoring within normal range for attention, SLP noted that pt was easily distracted by her phone while SLP educated pt re: results and compensatory measures. She  also had difficulty with alternating attention during trailmaking task. Pt states she does feel her focus has improved with adjustment in her anxiety medication. At this time, I feel pt's cognitive issues may be better managed by addressing  her underlying anxiety vs via cognitive reorganization. SLP provided written and verbal instructions re: compensations for memory and attention, as well as recommendation to follow-up with her PCP or Dr. Jannifer Franklin for her anxiety. No further skilled ST needs identified at this time.     Speech Therapy Frequency  One time visit    Duration  --   one time visit   Treatment/Interventions  Compensatory strategies;SLP instruction and feedback;Patient/family education    Potential to Achieve Goals  Good    Potential Considerations  --   anxiety   SLP Home Exercise Plan  attention/memory strategies provided    Consulted and Agree with Plan of Care  Patient       Patient will benefit from skilled therapeutic intervention in order to improve the following deficits and impairments:   Cognitive communication deficit    Problem List Patient Active Problem List   Diagnosis Date Noted  . Cough 08/16/2018  . Fever and chills 08/16/2018  . Lower respiratory infection 08/16/2018  . History of diverticulosis 05/04/2017  . Hypercalcemia 07/11/2013  . Hypocalcemia 07/05/2013  . Palpitations 07/05/2013  . Pes planus (flat feet) 03/29/2013  . History of PSVT (paroxysmal supraventricular tachycardia) 06/24/2012  . Hypokalemia 06/23/2012  . Hx of cardiac catheterization   . Positive PPD   . Multinodular goiter 05/12/2012  . SLEEP APNEA 01/19/2008  . Gastroesophageal reflux disease 01/18/2008  . DYSPEPSIA 01/18/2008  . IRRITABLE BOWEL SYNDROME 01/18/2008   Deneise Lever, [place], [place] Speech-Language Pathologist   Aliene Altes 10/09/2018, 3:42 PM  [facility] [address] [place], [place],  [address] Phone: [phone]   Fax:  [phone]  Name: Nandita Mathenia MRN: [id number] Date of Birth: 05-16-1961

## 2018-10-11 DIAGNOSIS — E663 Overweight: Secondary | ICD-10-CM | POA: Diagnosis not present

## 2018-10-11 DIAGNOSIS — M545 Low back pain: Secondary | ICD-10-CM | POA: Diagnosis not present

## 2018-10-11 DIAGNOSIS — R002 Palpitations: Secondary | ICD-10-CM | POA: Diagnosis not present

## 2018-10-11 DIAGNOSIS — R072 Precordial pain: Secondary | ICD-10-CM | POA: Diagnosis not present

## 2018-10-17 DIAGNOSIS — F4310 Post-traumatic stress disorder, unspecified: Secondary | ICD-10-CM | POA: Diagnosis not present

## 2018-10-17 DIAGNOSIS — F411 Generalized anxiety disorder: Secondary | ICD-10-CM | POA: Diagnosis not present

## 2018-10-17 DIAGNOSIS — F331 Major depressive disorder, recurrent, moderate: Secondary | ICD-10-CM | POA: Diagnosis not present

## 2018-10-20 ENCOUNTER — Other Ambulatory Visit: Payer: Self-pay | Admitting: Obstetrics and Gynecology

## 2018-10-20 DIAGNOSIS — Z1231 Encounter for screening mammogram for malignant neoplasm of breast: Secondary | ICD-10-CM

## 2018-10-21 ENCOUNTER — Ambulatory Visit: Payer: BLUE CROSS/BLUE SHIELD | Admitting: Family Medicine

## 2018-10-23 ENCOUNTER — Ambulatory Visit: Payer: BLUE CROSS/BLUE SHIELD | Admitting: Family Medicine

## 2018-10-23 ENCOUNTER — Ambulatory Visit: Payer: BLUE CROSS/BLUE SHIELD

## 2018-10-23 DIAGNOSIS — F4310 Post-traumatic stress disorder, unspecified: Secondary | ICD-10-CM | POA: Diagnosis not present

## 2018-10-23 DIAGNOSIS — F411 Generalized anxiety disorder: Secondary | ICD-10-CM | POA: Diagnosis not present

## 2018-10-23 DIAGNOSIS — F331 Major depressive disorder, recurrent, moderate: Secondary | ICD-10-CM | POA: Diagnosis not present

## 2018-10-24 ENCOUNTER — Ambulatory Visit (INDEPENDENT_AMBULATORY_CARE_PROVIDER_SITE_OTHER): Payer: BLUE CROSS/BLUE SHIELD | Admitting: Family Medicine

## 2018-10-24 ENCOUNTER — Other Ambulatory Visit: Payer: Self-pay

## 2018-10-24 ENCOUNTER — Encounter: Payer: Self-pay | Admitting: Family Medicine

## 2018-10-24 VITALS — BP 101/66 | HR 73 | Temp 98.4°F | Resp 14 | Ht 66.0 in | Wt 198.0 lb

## 2018-10-24 DIAGNOSIS — R05 Cough: Secondary | ICD-10-CM

## 2018-10-24 DIAGNOSIS — K219 Gastro-esophageal reflux disease without esophagitis: Secondary | ICD-10-CM | POA: Diagnosis not present

## 2018-10-24 DIAGNOSIS — R0981 Nasal congestion: Secondary | ICD-10-CM

## 2018-10-24 DIAGNOSIS — R059 Cough, unspecified: Secondary | ICD-10-CM

## 2018-10-24 MED ORDER — FLUTICASONE PROPIONATE 50 MCG/ACT NA SUSP: 1.0000 | Freq: Every day | NASAL | 6 refills | Status: DC

## 2018-10-24 NOTE — Patient Instructions (Addendum)
As we discussed there are likely multiple causes of your cough.  Try over-the-counter Allegra, Claritin, or Zyrtec once per day.  Additionally start Flonase nasal spray 1 to 2 sprays per nostril once per day for congestion cause.  For possible heartburn cause, restarting Protonix should help.  Additionally look at the information below on foods to avoid.  If your symptoms are not improving in the next 2 weeks, let me know and I will refer you to ear nose and throat.  If any worsening sooner please return for recheck here or urgent care if needed.   Food Choices for Gastroesophageal Reflux Disease, Adult When you have gastroesophageal reflux disease (GERD), the foods you eat and your eating habits are very important. Choosing the right foods can help ease your discomfort. Think about working with a nutrition specialist (dietitian) to help you make good choices. What are tips for following this plan?  Meals  Choose healthy foods that are low in fat, such as fruits, vegetables, whole grains, low-fat dairy products, and lean meat, fish, and poultry.  Eat small meals often instead of 3 large meals a day. Eat your meals slowly, and in a place where you are relaxed. Avoid bending over or lying down until 2-3 hours after eating.  Avoid eating meals 2-3 hours before bed.  Avoid drinking a lot of liquid with meals.  Cook foods using methods other than frying. Bake, grill, or broil food instead.  Avoid or limit: ? Chocolate. ? Peppermint or spearmint. ? Alcohol. ? Pepper. ? Black and decaffeinated coffee. ? Black and decaffeinated tea. ? Bubbly (carbonated) soft drinks. ? Caffeinated energy drinks and soft drinks.  Limit high-fat foods such as: ? Fatty meat or fried foods. ? Whole milk, cream, butter, or ice cream. ? Nuts and nut butters. ? Pastries, donuts, and sweets made with butter or shortening.  Avoid foods that cause symptoms. These foods may be different for everyone. Common foods  that cause symptoms include: ? Tomatoes. ? Oranges, lemons, and limes. ? Peppers. ? Spicy food. ? Onions and garlic. ? Vinegar. Lifestyle  Maintain a healthy weight. Ask your doctor what weight is healthy for you. If you need to lose weight, work with your doctor to do so safely.  Exercise for at least 30 minutes for 5 or more days each week, or as told by your doctor.  Wear loose-fitting clothes.  Do not smoke. If you need help quitting, ask your doctor.  Sleep with the head of your bed higher than your feet. Use a wedge under the mattress or blocks under the bed frame to raise the head of the bed. Summary  When you have gastroesophageal reflux disease (GERD), food and lifestyle choices are very important in easing your symptoms.  Eat small meals often instead of 3 large meals a day. Eat your meals slowly, and in a place where you are relaxed.  Limit high-fat foods such as fatty meat or fried foods.  Avoid bending over or lying down until 2-3 hours after eating.  Avoid peppermint and spearmint, caffeine, alcohol, and chocolate. This information is not intended to replace advice given to you by your health care provider. Make sure you discuss any questions you have with your health care provider. Document Released: 02/29/2012 Document Revised: 10/05/2016 Document Reviewed: 10/05/2016 Elsevier Interactive Patient Education  2019 Elsevier Inc.   Cough, Adult  Coughing is a reflex that clears your throat and your airways. Coughing helps to heal and protect your lungs. It  is normal to cough occasionally, but a cough that happens with other symptoms or lasts a long time may be a sign of a condition that needs treatment. A cough may last only 2-3 weeks (acute), or it may last longer than 8 weeks (chronic). What are the causes? Coughing is commonly caused by:  Breathing in substances that irritate your lungs.  A viral or bacterial respiratory  infection.  Allergies.  Asthma.  Postnasal drip.  Smoking.  Acid backing up from the stomach into the esophagus (gastroesophageal reflux).  Certain medicines.  Chronic lung problems, including COPD (or rarely, lung cancer).  Other medical conditions such as heart failure. Follow these instructions at home: Pay attention to any changes in your symptoms. Take these actions to help with your discomfort:  Take medicines only as told by your health care provider. ? If you were prescribed an antibiotic medicine, take it as told by your health care provider. Do not stop taking the antibiotic even if you start to feel better. ? Talk with your health care provider before you take a cough suppressant medicine.  Drink enough fluid to keep your urine clear or pale yellow.  If the air is dry, use a cold steam vaporizer or humidifier in your bedroom or your home to help loosen secretions.  Avoid anything that causes you to cough at work or at home.  If your cough is worse at night, try sleeping in a semi-upright position.  Avoid cigarette smoke. If you smoke, quit smoking. If you need help quitting, ask your health care provider.  Avoid caffeine.  Avoid alcohol.  Rest as needed. Contact a health care provider if:  You have new symptoms.  You cough up pus.  Your cough does not get better after 2-3 weeks, or your cough gets worse.  You cannot control your cough with suppressant medicines and you are losing sleep.  You develop pain that is getting worse or pain that is not controlled with pain medicines.  You have a fever.  You have unexplained weight loss.  You have night sweats. Get help right away if:  You cough up blood.  You have difficulty breathing.  Your heartbeat is very fast. This information is not intended to replace advice given to you by your health care provider. Make sure you discuss any questions you have with your health care provider. Document  Released: 02/26/2011 Document Revised: 02/05/2016 Document Reviewed: 11/06/2014 Elsevier Interactive Patient Education  [employer].     If you have lab work done today you will be contacted with your lab results within the next 2 weeks.  If you have not heard from [place] then please contact us. The fastest way to get your results is to register for My Chart.   IF you received an x-ray today, you will receive an invoice from [facility]. Please contact [facility] at [phone] with questions or concerns regarding your invoice.   IF you received labwork today, you will receive an invoice from [place]. Please contact [facility] at [phone] with questions or concerns regarding your invoice.   Our billing staff will not be able to assist you with questions regarding bills from these companies.  You will be contacted with the lab results as soon as they are available. The fastest way to get your results is to activate your My Chart account. Instructions are located on the last page of this paperwork. If you have not heard from [place] regarding the results in 2 weeks,  please contact this office.

## 2018-10-24 NOTE — Progress Notes (Signed)
Subjective:    Patient ID: [name], female    DOB: 1961/05/21, 58 y.o.   MRN: [id number]  HPI [name] is a 58 y.o. female Presents today for: Chief Complaint  Patient presents with  . Cough    last seen here on 112/4/20 for cough/throat, still have cough,horseness, rawness in throat but do not hurt when swollow since 08/17/19. Patient has other issuse and informed that the other issuse may need another visit. Wants to know glucose today explain ins may or may not cover since this is an accute visit   Seen for cough August 16, 2018.  Chest x-ray at that time for some other associated symptoms., treated with Tessalon Perles, Phenergan with codeine, Augmentin for lower respiratory infection.  No acute infection seen on chest x-ray, but slight leukocytosis at that time.    Seen again December 24 at [facility] urgent care, again for cough.  Minimal improvement, less productive.  Repeat chest x-ray without infiltrate.  Was treated for possible atypical respiratory illness, started on azithromycin, Z-Pak.  Additionally was treated with prednisone, cetirizine, and Robitussin with codeine.   Seen again at [facility] urgent care December 31 with diarrhea.  Treated with Pepcid with plan for clear liquids with slow resumption of diet and follow  Still with some hoarseness. Past few months.   Having to clear throat frequently. Feels like an irritation in throat. Dry cough,no fever, no weight loss, some sweating at night. Does have some ongoing nasal congestion for months. No allergy meds or nasal sprays. Did not try antihistamine. Occasional bad taste/water brash, belching and burping in mouth after lying down, but no heartburn.   Restarted protonix 2 days ago. Some relief in past 2 days in throat irritation.  No tobacco products. No hx neck radiation.  Occasional soda.  Sweet tea every few days.  Results from December 4 and December 24 as below.   Dg Chest 2 View  Result Date:  09/05/2018 CLINICAL DATA:  Cough and fever EXAM: CHEST - 2 VIEW COMPARISON:  Feb 10, 2018 FINDINGS: No edema or consolidation. The heart size and pulmonary vascularity are normal. No adenopathy. No evident bone lesions. IMPRESSION: No edema or consolidation. Electronically Signed   By: Lowella Grip III M.D.   On: 09/05/2018 12:02   Dg Chest 2 View  Result Date: 08/16/2018 CLINICAL DATA:  Productive cough rule out pneumonia EXAM: CHEST - 2 VIEW COMPARISON:  10/27/2016 FINDINGS: Normal heart size. Lungs clear. No pneumothorax. No pleural effusion. IMPRESSION: No active cardiopulmonary disease. Electronically Signed   By: Marybelle Killings M.D.   On: 08/16/2018 12:30   Results for orders placed or performed in visit on 08/16/18  POCT CBC  Result Value Ref Range   WBC 11.9 (A) 4.6 - 10.2 K/uL   Lymph, poc 1.1 0.6 - 3.4   POC LYMPH PERCENT 9.0 (A) 10 - 50 %L   MID (cbc) 0.3 0 - 0.9   POC MID % 2.9 0 - 12 %M   POC Granulocyte 10.5 (A) 2 - 6.9   Granulocyte percent 88.1 (A) 37 - 80 %G   RBC 5.56 (A) 4.04 - 5.48 M/uL   Hemoglobin 13.5 9.5 - 13.5 g/dL   HCT, POC 41.5 (A) 29 - 41 %   MCV 74.6 (A) 76 - 111 fL   MCH, POC 24.2 (A) 27 - 31.2 pg   MCHC 32.5 31.8 - 35.4 g/dL   RDW, POC 14.3 %   Platelet Count, POC  233 142 - 424 K/uL   MPV 7.3 0 - 99.8 fL  POC Influenza A&B(BINAX/QUICKVUE)  Result Value Ref Range   Influenza A, POC Negative Negative   Influenza B, POC Negative Negative       Patient Active Problem List   Diagnosis Date Noted  . Cough 08/16/2018  . Fever and chills 08/16/2018  . Lower respiratory infection 08/16/2018  . History of diverticulosis 05/04/2017  . Hypercalcemia 07/11/2013  . Hypocalcemia 07/05/2013  . Palpitations 07/05/2013  . Pes planus (flat feet) 03/29/2013  . History of PSVT (paroxysmal supraventricular tachycardia) 06/24/2012  . Hypokalemia 06/23/2012  . Hx of cardiac catheterization   . Positive PPD   . Multinodular goiter 05/12/2012  . SLEEP APNEA  01/19/2008  . Gastroesophageal reflux disease 01/18/2008  . DYSPEPSIA 01/18/2008  . IRRITABLE BOWEL SYNDROME 01/18/2008   Past Medical History:  Diagnosis Date  . Anemia   . Anxiety   . Colon polyp 08/28/2012   Tubular adenoma  . Dense breast   . Depression   . Genital warts   . GERD (gastroesophageal reflux disease)   . Heart murmur   . Hemorrhoids   . Hx of cardiac catheterization 2009   normal coronary arteries  . Hx of menorrhagia    Novasure  . Hypokalemia   . Hypomagnesemia   . Hypophosphatemia   . Lactose intolerance in adult   . Mitral valve prolapse   . Normal cardiac stress test 03/09/2012   no ischemia  . Positive PPD   . Rapid heart beat    States "irreg heart beat"  . Thyroid disease    Thyroid nodules   Past Surgical History:  Procedure Laterality Date  . CARDIAC CATHETERIZATION     x2 with normal results per pt  . NOVASURE ABLATION    . UPPER GASTROINTESTINAL ENDOSCOPY  May 2013   Allergies  Allergen Reactions  . Ceftriaxone Sodium Itching  . Doxycycline Rash   Prior to Admission medications   Medication Sig Start Date End Date Taking? Authorizing Provider  CALCIUM PO Take 2 tablets by mouth daily.   Yes [provider]  cholecalciferol (VITAMIN D) 1000 UNITS tablet Take 1,000 Units by mouth daily.    Yes [provider]  famotidine (PEPCID) 20 MG tablet Take 1 tablet (20 mg total) by mouth 2 (two) times daily. 09/12/18  Yes Neese, Moore, NP  losartan (COZAAR) 25 MG tablet Take 25 mg by mouth daily.   Yes [provider]  metoprolol tartrate (LOPRESSOR) 25 MG tablet Take 25 mg by mouth 2 (two) times daily. 12/29/15  Yes [provider]  PARoxetine HCl (PAXIL PO) Take by mouth. Prescribed by mental health provider. Could not tolerate paxil CR 37.5mg .   Yes [provider]  potassium chloride SA (K-DUR,KLOR-CON) 20 MEQ tablet TAKE 1 TABLET BY MOUTH TWICE DAILY 07/05/18  Yes Panosh, Standley Brooking, MD  TURMERIC PO  Take by mouth daily.   Yes [provider]  vitamin E (VITAMIN E) 400 UNIT capsule Take 400 Units by mouth daily.   Yes [provider]   Social History   Socioeconomic History  . Marital status: Divorced    Spouse name: Not on file  . Number of children: 2  . Years of education: 10  . Highest education level: Not on file  Occupational History  . Occupation: dialysis Engineer, production: [employer]  Social Needs  . Financial resource strain: Not on file  .  Food insecurity:    Worry: Not on file    Inability: Not on file  . Transportation needs:    Medical: Not on file    Non-medical: Not on file  Tobacco Use  . Smoking status: Never Smoker  . Smokeless tobacco: Never Used  Substance and Sexual Activity  . Alcohol use: No  . Drug use: No  . Sexual activity: Yes  Lifestyle  . Physical activity:    Days per week: Not on file    Minutes per session: Not on file  . Stress: Not on file  Relationships  . Social connections:    Talks on phone: Not on file    Gets together: Not on file    Attends religious service: Not on file    Active member of club or organization: Not on file    Attends meetings of clubs or organizations: Not on file    Relationship status: Not on file  . Intimate partner violence:    Fear of current or ex partner: Not on file    Emotionally abused: Not on file    Physically abused: Not on file    Forced sexual activity: Not on file  Other Topics Concern  . Not on file  Social History Narrative   Ms. 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 She lived in [place] and in [place] for a number of years her mom is from [place]. 16 years of education went to college at [employer] lives at home with her son who is in his 9s no pets   Neg ets tob etoh hx PA   6 hours of sleep   G2P2    TD2010  colonoscopy 2009    Now running  own business    HH of 2    Left handed    Caffeine use: tea sometimes          Review of Systems Per HPI.     Objective:   Physical Exam Vitals signs reviewed.  Constitutional:      General: She is not in acute distress.    Appearance: She is well-developed.  HENT:     Head: Normocephalic and atraumatic.     Right Ear: Hearing, tympanic membrane, ear canal and external ear normal.     Left Ear: Hearing, tympanic membrane, ear canal and external ear normal.     Nose: Nose normal.     Mouth/Throat:     Pharynx: No oropharyngeal exudate.  Eyes:     Conjunctiva/sclera: Conjunctivae normal.     Pupils: Pupils are equal, round, and reactive to light.  Cardiovascular:     Rate and Rhythm: Normal rate and regular rhythm.     Heart sounds: Normal heart sounds. No murmur.  Pulmonary:     Effort: Pulmonary effort is normal. No respiratory distress.     Breath sounds: Normal breath sounds. No wheezing or rhonchi.  Skin:    General: Skin is warm and dry.     Findings: No rash.  Neurological:     Mental Status: She is alert and oriented to person, place, and time.  Psychiatric:        Behavior: Behavior normal.    Vitals:   10/24/18 1503  BP: 101/66  Pulse: 73  Resp: 14  Temp: 98.4 F (36.9 C)  TempSrc: Oral  SpO2: 99%  Weight: 198 lb (89.8  kg)  Height: 5\' 6"  (1.676 m)      Assessment & Plan:    Meily Glowacki is a 58 y.o. female Cough - Plan: fluticasone (FLONASE) 50 MCG/ACT nasal spray  Nasal congestion - Plan: fluticasone (FLONASE) 50 MCG/ACT nasal spray  Gastroesophageal reflux disease, esophagitis presence not specified  Suspect upper airway cough component and with some recent improvement in throat symptoms with use of PPI, likely has reflux component.  However with nasal congestion likely some irritation from postnasal drip as well.  -Trigger avoidance discussed with handout given for foods with GERD, continue Protonix daily.  -Trial of  over-the-counter nonsedating/less sedating antihistamine.  -Start Flonase nasal spray 1 to 2 sprays per nostril each day and correct technique discussed.  -If not improving next few weeks, can refer to ear nose and throat.  RTC precautions if worsening sooner  Meds ordered this encounter  Medications  . fluticasone (FLONASE) 50 MCG/ACT nasal spray    Sig: Place 1-2 sprays into both nostrils daily.    Dispense:  16 g    Refill:  6   Patient Instructions   As we discussed there are likely multiple causes of your cough.  Try over-the-counter Allegra, Claritin, or Zyrtec once per day.  Additionally start Flonase nasal spray 1 to 2 sprays per nostril once per day for congestion cause.  For possible heartburn cause, restarting Protonix should help.  Additionally look at the information below on foods to avoid.  If your symptoms are not improving in the next 2 weeks, let me know and I will refer you to ear nose and throat.  If any worsening sooner please return for recheck here or urgent care if needed.   Food Choices for Gastroesophageal Reflux Disease, Adult When you have gastroesophageal reflux disease (GERD), the foods you eat and your eating habits are very important. Choosing the right foods can help ease your discomfort. Think about working with a nutrition specialist (dietitian) to help you make good choices. What are tips for following this plan?  Meals  Choose healthy foods that are low in fat, such as fruits, vegetables, whole grains, low-fat dairy products, and lean meat, fish, and poultry.  Eat small meals often instead of 3 large meals a day. Eat your meals slowly, and in a place where you are relaxed. Avoid bending over or lying down until 2-3 hours after eating.  Avoid eating meals 2-3 hours before bed.  Avoid drinking a lot of liquid with meals.  Cook foods using methods other than frying. Bake, grill, or broil food instead.  Avoid or limit: ? Chocolate. ? Peppermint or  spearmint. ? Alcohol. ? Pepper. ? Black and decaffeinated coffee. ? Black and decaffeinated tea. ? Bubbly (carbonated) soft drinks. ? Caffeinated energy drinks and soft drinks.  Limit high-fat foods such as: ? Fatty meat or fried foods. ? Whole milk, cream, butter, or ice cream. ? Nuts and nut butters. ? Pastries, donuts, and sweets made with butter or shortening.  Avoid foods that cause symptoms. These foods may be different for everyone. Common foods that cause symptoms include: ? Tomatoes. ? Oranges, lemons, and limes. ? Peppers. ? Spicy food. ? Onions and garlic. ? Vinegar. Lifestyle  Maintain a healthy weight. Ask your doctor what weight is healthy for you. If you need to lose weight, work with your doctor to do so safely.  Exercise for at least 30 minutes for 5 or more days each week, or as told by your doctor.  Wear  loose-fitting clothes.  Do not smoke. If you need help quitting, ask your doctor.  Sleep with the head of your bed higher than your feet. Use a wedge under the mattress or blocks under the bed frame to raise the head of the bed. Summary  When you have gastroesophageal reflux disease (GERD), food and lifestyle choices are very important in easing your symptoms.  Eat small meals often instead of 3 large meals a day. Eat your meals slowly, and in a place where you are relaxed.  Limit high-fat foods such as fatty meat or fried foods.  Avoid bending over or lying down until 2-3 hours after eating.  Avoid peppermint and spearmint, caffeine, alcohol, and chocolate. This information is not intended to replace advice given to you by your health care provider. Make sure you discuss any questions you have with your health care provider. Document Released: 02/29/2012 Document Revised: 10/05/2016 Document Reviewed: 10/05/2016 Elsevier Interactive Patient Education  2019 Elsevier Inc.   Cough, Adult  Coughing is a reflex that clears your throat and your  airways. Coughing helps to heal and protect your lungs. It is normal to cough occasionally, but a cough that happens with other symptoms or lasts a long time may be a sign of a condition that needs treatment. A cough may last only 2-3 weeks (acute), or it may last longer than 8 weeks (chronic). What are the causes? Coughing is commonly caused by:  Breathing in substances that irritate your lungs.  A viral or bacterial respiratory infection.  Allergies.  Asthma.  Postnasal drip.  Smoking.  Acid backing up from the stomach into the esophagus (gastroesophageal reflux).  Certain medicines.  Chronic lung problems, including COPD (or rarely, lung cancer).  Other medical conditions such as heart failure. Follow these instructions at home: Pay attention to any changes in your symptoms. Take these actions to help with your discomfort:  Take medicines only as told by your health care provider. ? If you were prescribed an antibiotic medicine, take it as told by your health care provider. Do not stop taking the antibiotic even if you start to feel better. ? Talk with your health care provider before you take a cough suppressant medicine.  Drink enough fluid to keep your urine clear or pale yellow.  If the air is dry, use a cold steam vaporizer or humidifier in your bedroom or your home to help loosen secretions.  Avoid anything that causes you to cough at work or at home.  If your cough is worse at night, try sleeping in a semi-upright position.  Avoid cigarette smoke. If you smoke, quit smoking. If you need help quitting, ask your health care provider.  Avoid caffeine.  Avoid alcohol.  Rest as needed. Contact a health care provider if:  You have new symptoms.  You cough up pus.  Your cough does not get better after 2-3 weeks, or your cough gets worse.  You cannot control your cough with suppressant medicines and you are losing sleep.  You develop pain that is getting worse  or pain that is not controlled with pain medicines.  You have a fever.  You have unexplained weight loss.  You have night sweats. Get help right away if:  You cough up blood.  You have difficulty breathing.  Your heartbeat is very fast. This information is not intended to replace advice given to you by your health care provider. Make sure you discuss any questions you have with your health care provider.  Document Released: 02/26/2011 Document Revised: 02/05/2016 Document Reviewed: 11/06/2014 Elsevier Interactive Patient Education  [employer].     If you have lab work done today you will be contacted with your lab results within the next 2 weeks.  If you have not heard from [place] then please contact us. The fastest way to get your results is to register for My Chart.   IF you received an x-ray today, you will receive an invoice from [facility]. Please contact [facility] at [phone] with questions or concerns regarding your invoice.   IF you received labwork today, you will receive an invoice from [place]. Please contact [facility] at [phone] with questions or concerns regarding your invoice.   Our billing staff will not be able to assist you with questions regarding bills from these companies.  You will be contacted with the lab results as soon as they are available. The fastest way to get your results is to activate your My Chart account. Instructions are located on the last page of this paperwork. If you have not heard from [place] regarding the results in 2 weeks, please contact this office.       Signed,   Merri Ray, MD [facility].  10/25/18 10:19 PM

## 2018-10-25 ENCOUNTER — Encounter: Payer: Self-pay | Admitting: Family Medicine

## 2018-10-27 ENCOUNTER — Other Ambulatory Visit: Payer: Self-pay | Admitting: Family Medicine

## 2018-10-27 ENCOUNTER — Telehealth: Payer: Self-pay | Admitting: Internal Medicine

## 2018-10-27 DIAGNOSIS — K219 Gastro-esophageal reflux disease without esophagitis: Secondary | ICD-10-CM

## 2018-10-27 NOTE — Telephone Encounter (Signed)
Called patient to determine if she has changed her PCP to Dr. Mitchel Honour at [facility].  Stated she has changed to [facility] due to the proximity compared to [facility] at [place].  Advised will send her request for prescription of Protonix to Dr. Mitchel Honour.  Agreed with plan.

## 2018-10-27 NOTE — Telephone Encounter (Signed)
Dr Panosh pt 

## 2018-10-27 NOTE — Telephone Encounter (Signed)
Copied from [place] [id number]. Topic: Quick Communication - Rx Refill/Question >> Oct 27, 2018 10:46 AM Antonieta Iba C wrote: Medication: Protonix 40MG - pt says that Dr. Volanda Napoleon prescribed in 2018.   Has the patient contacted their pharmacy? No   (Agent: If no, request that the patient contact the pharmacy for the refill.) (Agent: If yes, when and what did the pharmacy advise?)  Preferred Pharmacy (with phone number or street name): [facility], [place] - [place] [phone] (Phone) [phone] (Fax)    Agent: Please be advised that RX refills may take up to 3 business days. We ask that you follow-up with your pharmacy.

## 2018-10-27 NOTE — Telephone Encounter (Signed)
Pt. has established care with Primary Care at [facility].  Evaluated for annual exam 05/25/18, per Dr. Mitchel Honour, and GERD was addressed.  Will send Rx request to office.

## 2018-10-30 DIAGNOSIS — F411 Generalized anxiety disorder: Secondary | ICD-10-CM | POA: Diagnosis not present

## 2018-10-30 DIAGNOSIS — F4310 Post-traumatic stress disorder, unspecified: Secondary | ICD-10-CM | POA: Diagnosis not present

## 2018-10-30 DIAGNOSIS — F331 Major depressive disorder, recurrent, moderate: Secondary | ICD-10-CM | POA: Diagnosis not present

## 2018-10-31 ENCOUNTER — Ambulatory Visit (INDEPENDENT_AMBULATORY_CARE_PROVIDER_SITE_OTHER): Payer: BLUE CROSS/BLUE SHIELD | Admitting: Podiatry

## 2018-10-31 ENCOUNTER — Ambulatory Visit (INDEPENDENT_AMBULATORY_CARE_PROVIDER_SITE_OTHER): Payer: BLUE CROSS/BLUE SHIELD

## 2018-10-31 DIAGNOSIS — M722 Plantar fascial fibromatosis: Secondary | ICD-10-CM | POA: Diagnosis not present

## 2018-10-31 DIAGNOSIS — N951 Menopausal and female climacteric states: Secondary | ICD-10-CM | POA: Insufficient documentation

## 2018-10-31 DIAGNOSIS — N912 Amenorrhea, unspecified: Secondary | ICD-10-CM | POA: Insufficient documentation

## 2018-10-31 DIAGNOSIS — N393 Stress incontinence (female) (male): Secondary | ICD-10-CM | POA: Insufficient documentation

## 2018-10-31 DIAGNOSIS — N9089 Other specified noninflammatory disorders of vulva and perineum: Secondary | ICD-10-CM | POA: Insufficient documentation

## 2018-10-31 DIAGNOSIS — A6000 Herpesviral infection of urogenital system, unspecified: Secondary | ICD-10-CM | POA: Insufficient documentation

## 2018-10-31 MED ORDER — METHYLPREDNISOLONE 4 MG PO TBPK: ORAL_TABLET | ORAL | 0 refills | Status: DC

## 2018-10-31 MED ORDER — MELOXICAM 15 MG PO TABS: 15.0000 mg | ORAL_TABLET | Freq: Every day | ORAL | 3 refills | Status: DC

## 2018-10-31 NOTE — Patient Instructions (Signed)

## 2018-11-01 NOTE — Progress Notes (Signed)
Subjective:  Patient ID: [name], female    DOB: 10-Nov-1960,  MRN: [id number] HPI Chief Complaint  Patient presents with  . Foot Pain    Pt states Bilateral entire bottom of feet pain, feels like walking on bone, tingling, 10 yr duration but has worsened over past month. No known injuries. Pt states history of plantar fasciitis bilateral and mortons neuroma interdigital 3rd and 4th right foot.    58 y.o. female presents with the above complaint.   ROS: Denies fever chills nausea vomiting muscle aches pains back pain chest pain shortness of breath.  Past Medical History:  Diagnosis Date  . Anemia   . Anxiety   . Colon polyp 08/28/2012   Tubular adenoma  . Dense breast   . Depression   . Genital warts   . GERD (gastroesophageal reflux disease)   . Heart murmur   . Hemorrhoids   . Hx of cardiac catheterization 2009   normal coronary arteries  . Hx of menorrhagia    Novasure  . Hypokalemia   . Hypomagnesemia   . Hypophosphatemia   . Lactose intolerance in adult   . Mitral valve prolapse   . Normal cardiac stress test 03/09/2012   no ischemia  . Positive PPD   . Rapid heart beat    States "irreg heart beat"  . Thyroid disease    Thyroid nodules   Past Surgical History:  Procedure Laterality Date  . CARDIAC CATHETERIZATION     x2 with normal results per pt  . NOVASURE ABLATION    . UPPER GASTROINTESTINAL ENDOSCOPY  May 2013    Current Outpatient Medications:  .  amoxicillin (AMOXIL) 500 MG tablet, TK 1 T PO Q 8 HOURS, Disp: , Rfl: 0 .  CALCIUM PO, Take 2 tablets by mouth daily., Disp: , Rfl:  .  cetirizine (ZYRTEC) 10 MG tablet, , Disp: , Rfl:  .  cholecalciferol (VITAMIN D) 1000 UNITS tablet, Take 1,000 Units by mouth daily. , Disp: , Rfl:  .  famotidine (PEPCID) 20 MG tablet, Take 1 tablet (20 mg total) by mouth 2 (two) times daily., Disp: 30 tablet, Rfl: 0 .  fluticasone (FLONASE) 50 MCG/ACT nasal spray, Place 1-2 sprays into both nostrils daily., Disp: 16  g, Rfl: 6 .  losartan (COZAAR) 25 MG tablet, Take 25 mg by mouth daily., Disp: , Rfl:  .  metoprolol tartrate (LOPRESSOR) 25 MG tablet, Take 25 mg by mouth 2 (two) times daily., Disp: , Rfl: 6 .  pantoprazole (PROTONIX) 40 MG tablet, TAKE 1 TABLET BY MOUTH DAILY, Disp: 90 tablet, Rfl: 0 .  PARoxetine HCl (PAXIL PO), Take by mouth. Prescribed by mental health provider. Could not tolerate paxil CR 37.5mg ., Disp: , Rfl:  .  potassium chloride SA (K-DUR,KLOR-CON) 20 MEQ tablet, TAKE 1 TABLET BY MOUTH TWICE DAILY, Disp: 60 tablet, Rfl: 5 .  sulfamethoxazole-trimethoprim (BACTRIM DS,SEPTRA DS) 800-160 MG tablet, TK 1 T PO  BID, Disp: , Rfl: 1 .  traZODone (DESYREL) 50 MG tablet, TK 1 TO 2 TS PO QHS, Disp: , Rfl:  .  TURMERIC PO, Take by mouth daily., Disp: , Rfl:  .  vitamin E (VITAMIN E) 400 UNIT capsule, Take 400 Units by mouth daily., Disp: , Rfl:  .  meloxicam (MOBIC) 15 MG tablet, Take 1 tablet (15 mg total) by mouth daily., Disp: 30 tablet, Rfl: 3 .  methylPREDNISolone (MEDROL DOSEPAK) 4 MG TBPK tablet, 6 day dose pack - take as directed, Disp: 21 tablet, Rfl:  0  Current Facility-Administered Medications:  .  0.9 %  sodium chloride infusion, 500 mL, Intravenous, Once, Pyrtle, Carie Caddy, MD .  nitroGLYCERIN (NITROSTAT) SL tablet 0.3 mg, 0.3 mg, Sublingual, Q5 min PRN, Ofilia Neas, PA-C, 0.3 mg at 11/22/15 0951  Allergies  Allergen Reactions  . Ceftriaxone Sodium Itching  . Doxycycline Rash   Review of Systems Objective:  There were no vitals filed for this visit.  General: Well developed, nourished, in no acute distress, alert and oriented x3   Dermatological: Skin is warm, dry and supple bilateral. Nails x 10 are well maintained; remaining integument appears unremarkable at this time. There are no open sores, no preulcerative lesions, no rash or signs of infection present.  Vascular: Dorsalis Pedis artery and Posterior Tibial artery pedal pulses are 2/4 bilateral with immedate  capillary fill time. Pedal hair growth present. No varicosities and no lower extremity edema present bilateral.   Neruologic: Grossly intact via light touch bilateral. Vibratory intact via tuning fork bilateral. Protective threshold with Semmes Wienstein monofilament intact to all pedal sites bilateral. Patellar and Achilles deep tendon reflexes 2+ bilateral. No Babinski or clonus noted bilateral.   Musculoskeletal: No gross boney pedal deformities bilateral. No pain, crepitus, or limitation noted with foot and ankle range of motion bilateral. Muscular strength 5/5 in all groups tested bilateral.  She is pain on palpation medial calcaneal tubercles of bilateral heel and some tenderness on palpation of the metatarsals forefoot bilateral.  Gait: Unassisted, Nonantalgic.    Radiographs:  Radiographs taken today demonstrate soft tissue increase in density plantar calcaneal insertion site of the bilateral foot.  No open lesions or wounds.  Appears that there is some osteoarthritic change of the midfoot bilateral.  Assessment & Plan:   Assessment: Metatarsalgia and plantar fasciitis bilateral.  Some early osteoarthritic changes midfoot.  Plan: We discussed the etiology pathology conservative surgical therapies at this point offered her injections which she declined start her on a Medrol Dosepak to be followed by meloxicam.  Discussed the need for possible orthotics placement plantar fascial braces     Maguire Killmer T. [place], [place]

## 2018-11-06 DIAGNOSIS — F4310 Post-traumatic stress disorder, unspecified: Secondary | ICD-10-CM | POA: Diagnosis not present

## 2018-11-06 DIAGNOSIS — F331 Major depressive disorder, recurrent, moderate: Secondary | ICD-10-CM | POA: Diagnosis not present

## 2018-11-06 DIAGNOSIS — F411 Generalized anxiety disorder: Secondary | ICD-10-CM | POA: Diagnosis not present

## 2018-11-13 DIAGNOSIS — F331 Major depressive disorder, recurrent, moderate: Secondary | ICD-10-CM | POA: Diagnosis not present

## 2018-11-13 DIAGNOSIS — F4310 Post-traumatic stress disorder, unspecified: Secondary | ICD-10-CM | POA: Diagnosis not present

## 2018-11-13 DIAGNOSIS — F411 Generalized anxiety disorder: Secondary | ICD-10-CM | POA: Diagnosis not present

## 2018-11-16 ENCOUNTER — Ambulatory Visit
Admission: RE | Admit: 2018-11-16 | Discharge: 2018-11-16 | Disposition: A | Discharge status: Home / Self Care | Payer: BLUE CROSS/BLUE SHIELD | Source: Ambulatory Visit | Attending: Obstetrics and Gynecology | Admitting: Obstetrics and Gynecology

## 2018-11-16 DIAGNOSIS — Z1231 Encounter for screening mammogram for malignant neoplasm of breast: Secondary | ICD-10-CM

## 2018-11-28 ENCOUNTER — Ambulatory Visit: Payer: BLUE CROSS/BLUE SHIELD | Admitting: Emergency Medicine

## 2018-11-28 ENCOUNTER — Ambulatory Visit: Payer: BLUE CROSS/BLUE SHIELD | Admitting: Podiatry

## 2018-11-28 ENCOUNTER — Encounter: Payer: Self-pay | Admitting: Emergency Medicine

## 2018-11-28 ENCOUNTER — Ambulatory Visit: Payer: Self-pay | Admitting: *Deleted

## 2018-11-28 ENCOUNTER — Other Ambulatory Visit: Payer: Self-pay

## 2018-11-28 VITALS — BP 102/60 | HR 80 | Temp 98.0°F | Ht 66.0 in | Wt 198.4 lb

## 2018-11-28 DIAGNOSIS — R0789 Other chest pain: Secondary | ICD-10-CM | POA: Insufficient documentation

## 2018-11-28 NOTE — Assessment & Plan Note (Signed)
Clinically stable.  No red flag signs or symptoms.  Stable vital signs.  EKG unchanged from previous ones.  Recommend to follow-up with her cardiologist this week.  ED precautions given.

## 2018-11-28 NOTE — Telephone Encounter (Signed)
Pt called with having some chest heaviness in the center of her chest. She denies cardiac symptoms. She does have a history of reflux and is taking an antiacid. She took one this morning. It started yesterday. She can feel it when she moves sometimes.  Denies fever.  It feels like an ache.  Appointment scheduled per protocol. Routing to flow at PCP. Advised to call 911 if she starts having cardiac symptoms including nausea, vomiting, sweating, shortness of breath,  weakness, pain in abd, back, neck, shoulders and arms. Pt voiced understanding.  Reason for Disposition . [1] Chest pain(s) lasting a few seconds AND [2] persists > 3 days  Answer Assessment - Initial Assessment Questions 1. LOCATION: "Where does it hurt?"       Center of chest  2. RADIATION: "Does the pain go anywhere else?" (e.g., into neck, jaw, arms, back)     When she leans or lifts her arms to the side  3. ONSET: "When did the chest pain begin?" (Minutes, hours or days)      yesterday 4. PATTERN "Does the pain come and go, or has it been constant since it started?"  "Does it get worse with exertion?"      Comes and goes 5. DURATION: "How long does it last" (e.g., seconds, minutes, hours)     Seconds with movement 6. SEVERITY: "How bad is the pain?"  (e.g., Scale 1-10; mild, moderate, or severe)    - MILD (1-3): doesn't interfere with normal activities     - MODERATE (4-7): interferes with normal activities or awakens from sleep    - SEVERE (8-10): excruciating pain, unable to do any normal activities       ache #5 7. CARDIAC RISK FACTORS: "Do you have any history of heart problems or risk factors for heart disease?" (e.g., prior heart attack, angina; high blood pressure, diabetes, being overweight, high cholesterol, smoking, or strong family history of heart disease)     Mitral valve prolapse and irregular HB  Now 7.'s 8. PULMONARY RISK FACTORS: "Do you have any history of lung disease?"  (e.g., blood clots in lung,  asthma, emphysema, birth control pills)     no 9. CAUSE: "What do you think is causing the chest pain?"     Not sure, maybe indigestion 10. OTHER SYMPTOMS: "Do you have any other symptoms?" (e.g., dizziness, nausea, vomiting, sweating, fever, difficulty breathing, cough)       Some sweating 11. PREGNANCY: "Is there any chance you are pregnant?" "When was your last menstrual period?"       No periods  Protocols used: CHEST PAIN-A-AH

## 2018-11-28 NOTE — Patient Instructions (Addendum)
   If you have lab work done today you will be contacted with your lab results within the next 2 weeks.  If you have not heard from us then please contact us. The fastest way to get your results is to register for My Chart.   IF you received an x-ray today, you will receive an invoice from [facility]. Please contact [facility] at [phone] with questions or concerns regarding your invoice.   IF you received labwork today, you will receive an invoice from [facility]. Please contact [facility] at [phone] with questions or concerns regarding your invoice.   Our billing staff will not be able to assist you with questions regarding bills from these companies.  You will be contacted with the lab results as soon as they are available. The fastest way to get your results is to activate your My Chart account. Instructions are located on the last page of this paperwork. If you have not heard from us regarding the results in 2 weeks, please contact this office.     Nonspecific Chest Pain Chest pain can be caused by many different conditions. Some causes of chest pain can be life-threatening. These will require treatment right away. Serious causes of chest pain include:  Heart attack.  A tear in the body's main blood vessel.  Redness and swelling (inflammation) around your heart.  Blood clot in your lungs. Other causes of chest pain may not be so serious. These include:  Heartburn.  Anxiety or stress.  Damage to bones or muscles in your chest.  Lung infections. Chest pain can feel like:  Pain or discomfort in your chest.  Crushing, pressure, aching, or squeezing pain.  Burning or tingling.  Dull or sharp pain that is worse when you move, cough, or take a deep breath.  Pain or discomfort that is also felt in your back, neck, jaw, shoulder, or arm, or pain that spreads to any of these areas. It is hard to know whether your pain is caused by something that is  serious or something that is not so serious. So it is important to see your doctor right away if you have chest pain. Follow these instructions at home: Medicines  Take over-the-counter and prescription medicines only as told by your doctor.  If you were prescribed an antibiotic medicine, take it as told by your doctor. Do not stop taking the antibiotic even if you start to feel better. Lifestyle   Rest as told by your doctor.  Do not use any products that contain nicotine or tobacco, such as cigarettes, e-cigarettes, and chewing tobacco. If you need help quitting, ask your doctor.  Do not drink alcohol.  Make lifestyle changes as told by your doctor. These may include: ? Getting regular exercise. Ask your doctor what activities are safe for you. ? Eating a heart-healthy diet. A diet and nutrition specialist (dietitian) can help you to learn healthy eating options. ? Staying at a healthy weight. ? Treating diabetes or high blood pressure, if needed. ? Lowering your stress. Activities such as yoga and relaxation techniques can help. General instructions  Pay attention to any changes in your symptoms. Tell your doctor about them or any new symptoms.  Avoid any activities that cause chest pain.  Keep all follow-up visits as told by your doctor. This is important. You may need more testing if your chest pain does not go away. Contact a doctor if:  Your chest pain does not go away.  You feel   depressed.  You have a fever. Get help right away if:  Your chest pain is worse.  You have a cough that gets worse, or you cough up blood.  You have very bad (severe) pain in your belly (abdomen).  You pass out (faint).  You have either of these for no clear reason: ? Sudden chest discomfort. ? Sudden discomfort in your arms, back, neck, or jaw.  You have shortness of breath at any time.  You suddenly start to sweat, or your skin gets clammy.  You feel sick to your stomach  (nauseous).  You throw up (vomit).  You suddenly feel lightheaded or dizzy.  You feel very weak or tired.  Your heart starts to beat fast, or it feels like it is skipping beats. These symptoms may be an emergency. Do not wait to see if the symptoms will go away. Get medical help right away. Call your local emergency services (911 in the [place] Do not drive yourself to the hospital. Summary  Chest pain can be caused by many different conditions. The cause may be serious and need treatment right away. If you have chest pain, see your doctor right away.  Follow your doctor's instructions for taking medicines and making lifestyle changes.  Keep all follow-up visits as told by your doctor. This includes visits for any further testing if your chest pain does not go away.  Be sure to know the signs that show that your condition has become worse. Get help right away if you have these symptoms. This information is not intended to replace advice given to you by your health care provider. Make sure you discuss any questions you have with your health care provider. Document Released: 02/16/2008 Document Revised: 03/02/2018 Document Reviewed: 03/02/2018 Elsevier Interactive Patient Education  2019 [employer].

## 2018-11-28 NOTE — Telephone Encounter (Signed)
FYI has apt with you today

## 2018-11-28 NOTE — Progress Notes (Signed)
[name] 58 y.o.   Chief Complaint  Patient presents with   chest heaviness    started this morning, thinks it may be heatburn    HISTORY OF PRESENT ILLNESS: This is a 59 y.o. female complaining of pain across her chest that started yesterday at 11:00 in the morning.  Started at rest with no associated symptoms and she was able to finish her day as usual.  Chest heaviness worse when she sits on her elbows.  No radiation.  Worse when she lifts things.  Has had cardiac work-up in the recent past with no significant findings.  Knows her EKG is abnormal has some T wave inversions.  Has a history of previous normal cardiac cath x2.   HPI   Prior to Admission medications   Medication Sig Start Date End Date Taking? Authorizing Provider  CALCIUM PO Take 2 tablets by mouth daily.   Yes [provider]  cholecalciferol (VITAMIN D) 1000 UNITS tablet Take 1,000 Units by mouth daily.    Yes [provider]  famotidine (PEPCID) 20 MG tablet Take 1 tablet (20 mg total) by mouth 2 (two) times daily. 09/12/18  Yes Neese, Webb, NP  losartan (COZAAR) 25 MG tablet Take 25 mg by mouth daily.   Yes [provider]  metoprolol tartrate (LOPRESSOR) 25 MG tablet Take 25 mg by mouth 2 (two) times daily. 12/29/15  Yes [provider]  pantoprazole (PROTONIX) 40 MG tablet TAKE 1 TABLET BY MOUTH DAILY 10/27/18  Yes Panosh, Standley Brooking, MD  PARoxetine HCl (PAXIL PO) Take by mouth. Prescribed by mental health provider. Could not tolerate paxil CR 37.5mg .   Yes [provider]  potassium chloride SA (K-DUR,KLOR-CON) 20 MEQ tablet TAKE 1 TABLET BY MOUTH TWICE DAILY 07/05/18  Yes Panosh, Standley Brooking, MD  TURMERIC PO Take by mouth daily.   Yes [provider]  vitamin E (VITAMIN E) 400 UNIT capsule Take 400 Units by mouth daily.   Yes [provider]    Allergies  Allergen Reactions   Ceftriaxone Sodium Itching   Doxycycline Rash    Patient  Active Problem List   Diagnosis Date Noted   Amenorrhea 10/31/2018   Genital herpes simplex 10/31/2018   Lesion of vulva 10/31/2018   Menopausal flushing 10/31/2018   Stress incontinence of urine 10/31/2018   Cough 08/16/2018   Fever and chills 08/16/2018   Lower respiratory infection 08/16/2018   History of diverticulosis 05/04/2017   Hypercalcemia 07/11/2013   Hypocalcemia 07/05/2013   Palpitations 07/05/2013   Pes planus (flat feet) 03/29/2013   History of PSVT (paroxysmal supraventricular tachycardia) 06/24/2012   Hypokalemia 06/23/2012   Hx of cardiac catheterization    Positive PPD    Multinodular goiter 05/12/2012   SLEEP APNEA 01/19/2008   Gastroesophageal reflux disease 01/18/2008   DYSPEPSIA 01/18/2008   IRRITABLE BOWEL SYNDROME 01/18/2008    Past Medical History:  Diagnosis Date   Anemia    Anxiety    Colon polyp 08/28/2012   Tubular adenoma   Dense breast    Depression    Genital warts    GERD (gastroesophageal reflux disease)    Heart murmur    Hemorrhoids    Hx of cardiac catheterization 2009   normal coronary arteries   Hx of menorrhagia    Novasure   Hypokalemia    Hypomagnesemia    Hypophosphatemia    Lactose intolerance in adult    Mitral valve prolapse    Normal cardiac  stress test 03/09/2012   no ischemia   Positive PPD    Rapid heart beat    States "irreg heart beat"   Thyroid disease    Thyroid nodules    Past Surgical History:  Procedure Laterality Date   CARDIAC CATHETERIZATION     x2 with normal results per pt   NOVASURE ABLATION     UPPER GASTROINTESTINAL ENDOSCOPY  May 2013    Social History   Socioeconomic History   Marital status: Divorced    Spouse name: Not on file   Number of children: 2   Years of education: 16   Highest education level: Not on file  Occupational History   Occupation: dialysis Engineer, production: [employer]  resource strain: Not on file   Food insecurity:    Worry: Not on file    Inability: Not on file   Transportation needs:    Medical: Not on file    Non-medical: Not on file  Tobacco Use   Smoking status: Never Smoker   Smokeless tobacco: Never Used  Substance and Sexual Activity   Alcohol use: No   Drug use: No   Sexual activity: Yes  Lifestyle   Physical activity:    Days per week: Not on file    Minutes per session: Not on file   Stress: Not on file  Relationships   Social connections:    Talks on phone: Not on file    Gets together: Not on file    Attends religious service: Not on file    Active member of club or organization: Not on file    Attends meetings of clubs or organizations: Not on file    Relationship status: Not on file   Intimate partner violence:    Fear of current or ex partner: Not on file    Emotionally abused: Not on file    Physically abused: Not on file    Forced sexual activity: Not on file  Other Topics Concern   Not on file  Social History Narrative   Ms. 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 She lived in [place] and in [place] for a number of years her mom is from [place]. 45 years of education went to college at [employer] lives at home with her son who is in his 82s no pets   Neg ets tob etoh hx PA   6 hours of sleep   G2P2    TD2010  colonoscopy 2009   Now running  own business    HH of 2    Left handed    Caffeine use: tea sometimes          Family History  Problem Relation Age of Onset   Heart disease Father        died Mi 72   Hypertension Maternal Grandfather    Diabetes Maternal Grandfather    Heart disease Maternal Grandfather    Colon cancer Maternal Grandfather    Rectal cancer Maternal Grandfather    Diabetes Maternal Grandmother    Prostate cancer Neg Hx    Breast cancer Neg Hx        Review of Systems  Constitutional: Negative.  Negative for chills and fever.  HENT: Negative.   Eyes: Negative.   Respiratory: Negative.  Negative for cough, shortness of breath and  wheezing.   Cardiovascular: Positive for chest pain and palpitations. Negative for claudication and leg swelling.  Gastrointestinal: Negative for abdominal pain, diarrhea, nausea and vomiting.  Genitourinary: Negative.   Musculoskeletal: Negative.   Skin: Negative.  Negative for rash.  Neurological: Negative.  Negative for headaches.  Endo/Heme/Allergies: Negative.     Vitals:   11/28/18 1049  BP: 102/60  Pulse: 80  Temp: 98 F (36.7 C)  SpO2: 96%    Physical Exam Vitals signs reviewed.  Constitutional:      Appearance: Normal appearance.  HENT:     Head: Normocephalic and atraumatic.     Mouth/Throat:     Mouth: Mucous membranes are moist.     Pharynx: Oropharynx is clear.  Eyes:     Extraocular Movements: Extraocular movements intact.     Conjunctiva/sclera: Conjunctivae normal.     Pupils: Pupils are equal, round, and reactive to light.  Neck:     Musculoskeletal: Normal range of motion and neck supple.  Cardiovascular:     Rate and Rhythm: Normal rate and regular rhythm.     Pulses: Normal pulses.     Heart sounds: Normal heart sounds.  Pulmonary:     Effort: Pulmonary effort is normal.     Breath sounds: Normal breath sounds.  Musculoskeletal: Normal range of motion.  Skin:    General: Skin is warm and dry.     Capillary Refill: Capillary refill takes less than 2 seconds.  Neurological:     General: No focal deficit present.     Mental Status: She is alert and oriented to person, place, and time.  Psychiatric:        Mood and Affect: Mood normal.        Behavior: Behavior normal.    EKG: Sinus tachycardia with T wave inversion in 3 and aVF.  No change from 08/16/2018   A total of 40 minutes was spent in the room with the patient, greater than 50% of which was in  counseling/coordination of care regarding differential diagnosis, management, medications, review of EKGs, ED precautions, need to follow-up with her cardiologist this week prognosis, and need for follow-up.  ASSESSMENT & PLAN: Chest heaviness Clinically stable.  No red flag signs or symptoms.  Stable vital signs.  EKG unchanged from previous ones.  Recommend to follow-up with her cardiologist this week.  ED precautions given.  Breauna was seen today for chest heaviness.  Diagnoses and all orders for this visit:  Chest heaviness -     EKG 12-Lead  Atypical chest pain    Patient Instructions       If you have lab work done today you will be contacted with your lab results within the next 2 weeks.  If you have not heard from [place] then please contact us. The fastest way to get your results is to register for My Chart.   IF you received an x-ray today, you will receive an invoice from [facility]. Please contact [facility] at [phone] with questions or concerns regarding your invoice.   IF you received labwork today, you will receive an invoice from [facility]. Please contact [facility] at [phone] with questions or concerns regarding your invoice.   Our billing staff will not be able to assist you with questions regarding bills from these companies.  You will be contacted with the lab results as soon as they are available. The fastest way to get your results is to activate your My Chart account. Instructions are located on  the last page of this paperwork. If you have not heard from [place] regarding the results in 2 weeks, please contact this office.     Nonspecific Chest Pain Chest pain can be caused by many different conditions. Some causes of chest pain can be life-threatening. These will require treatment right away. Serious causes of chest pain include:  Heart attack.  A tear in the body's main blood vessel.  Redness and swelling (inflammation) around your  heart.  Blood clot in your lungs. Other causes of chest pain may not be so serious. These include:  Heartburn.  Anxiety or stress.  Damage to bones or muscles in your chest.  Lung infections. Chest pain can feel like:  Pain or discomfort in your chest.  Crushing, pressure, aching, or squeezing pain.  Burning or tingling.  Dull or sharp pain that is worse when you move, cough, or take a deep breath.  Pain or discomfort that is also felt in your back, neck, jaw, shoulder, or arm, or pain that spreads to any of these areas. It is hard to know whether your pain is caused by something that is serious or something that is not so serious. So it is important to see your doctor right away if you have chest pain. Follow these instructions at home: Medicines  Take over-the-counter and prescription medicines only as told by your doctor.  If you were prescribed an antibiotic medicine, take it as told by your doctor. Do not stop taking the antibiotic even if you start to feel better. Lifestyle   Rest as told by your doctor.  Do not use any products that contain nicotine or tobacco, such as cigarettes, e-cigarettes, and chewing tobacco. If you need help quitting, ask your doctor.  Do not drink alcohol.  Make lifestyle changes as told by your doctor. These may include: ? Getting regular exercise. Ask your doctor what activities are safe for you. ? Eating a heart-healthy diet. A diet and nutrition specialist (dietitian) can help you to learn healthy eating options. ? Staying at a healthy weight. ? Treating diabetes or high blood pressure, if needed. ? Lowering your stress. Activities such as yoga and relaxation techniques can help. General instructions  Pay attention to any changes in your symptoms. Tell your doctor about them or any new symptoms.  Avoid any activities that cause chest pain.  Keep all follow-up visits as told by your doctor. This is important. You may need more  testing if your chest pain does not go away. Contact a doctor if:  Your chest pain does not go away.  You feel depressed.  You have a fever. Get help right away if:  Your chest pain is worse.  You have a cough that gets worse, or you cough up blood.  You have very bad (severe) pain in your belly (abdomen).  You pass out (faint).  You have either of these for no clear reason: ? Sudden chest discomfort. ? Sudden discomfort in your arms, back, neck, or jaw.  You have shortness of breath at any time.  You suddenly start to sweat, or your skin gets clammy.  You feel sick to your stomach (nauseous).  You throw up (vomit).  You suddenly feel lightheaded or dizzy.  You feel very weak or tired.  Your heart starts to beat fast, or it feels like it is skipping beats. These symptoms may be an emergency. Do not wait to see if the symptoms will go away. Get medical help right away.  Call your local emergency services (911 in the [place] Do not drive yourself to the hospital. Summary  Chest pain can be caused by many different conditions. The cause may be serious and need treatment right away. If you have chest pain, see your doctor right away.  Follow your doctor's instructions for taking medicines and making lifestyle changes.  Keep all follow-up visits as told by your doctor. This includes visits for any further testing if your chest pain does not go away.  Be sure to know the signs that show that your condition has become worse. Get help right away if you have these symptoms. This information is not intended to replace advice given to you by your health care provider. Make sure you discuss any questions you have with your health care provider. Document Released: 02/16/2008 Document Revised: 03/02/2018 Document Reviewed: 03/02/2018 Elsevier Interactive Patient Education  2019 Elsevier Inc.      Agustina Caroli, MD [facility]

## 2018-11-28 NOTE — Telephone Encounter (Signed)
Saw her this morning.  Thanks.

## 2018-11-30 ENCOUNTER — Emergency Department (HOSPITAL_COMMUNITY)
Admission: EM | Admit: 2018-11-30 | Discharge: 2018-11-30 | Disposition: A | Discharge status: Home / Self Care | Payer: BLUE CROSS/BLUE SHIELD | Attending: Emergency Medicine | Admitting: Emergency Medicine

## 2018-11-30 ENCOUNTER — Emergency Department (HOSPITAL_COMMUNITY): Payer: BLUE CROSS/BLUE SHIELD

## 2018-11-30 ENCOUNTER — Encounter (HOSPITAL_COMMUNITY): Payer: Self-pay

## 2018-11-30 ENCOUNTER — Other Ambulatory Visit: Payer: Self-pay

## 2018-11-30 DIAGNOSIS — Z79899 Other long term (current) drug therapy: Secondary | ICD-10-CM | POA: Diagnosis not present

## 2018-11-30 DIAGNOSIS — R079 Chest pain, unspecified: Secondary | ICD-10-CM | POA: Diagnosis not present

## 2018-11-30 DIAGNOSIS — R0789 Other chest pain: Secondary | ICD-10-CM

## 2018-11-30 LAB — CBC
HCT: 44.1 % (ref 36.0–46.0)
HEMOGLOBIN: 13 g/dL (ref 12.0–15.0)
MCH: 23.5 pg — ABNORMAL LOW (ref 26.0–34.0)
MCHC: 29.5 g/dL — ABNORMAL LOW (ref 30.0–36.0)
MCV: 79.6 fL — ABNORMAL LOW (ref 80.0–100.0)
Platelets: 240 10*3/uL (ref 150–400)
RBC: 5.54 MIL/uL — ABNORMAL HIGH (ref 3.87–5.11)
RDW: 14.8 % (ref 11.5–15.5)
WBC: 8.7 10*3/uL (ref 4.0–10.5)
nRBC: 0 % (ref 0.0–0.2)

## 2018-11-30 LAB — BASIC METABOLIC PANEL
Anion gap: 9 (ref 5–15)
BUN: 15 mg/dL (ref 6–20)
CO2: 27 mmol/L (ref 22–32)
Calcium: 9.3 mg/dL (ref 8.9–10.3)
Chloride: 106 mmol/L (ref 98–111)
Creatinine, Ser: 1 mg/dL (ref 0.44–1.00)
GFR calc Af Amer: >60 mL/min (ref >60)
GFR calc non Af Amer: >60 mL/min (ref >60)
Glucose, Bld: 114 mg/dL — ABNORMAL HIGH (ref 70–99)
Potassium: 3 mmol/L — ABNORMAL LOW (ref 3.5–5.1)
Sodium: 142 mmol/L (ref 135–145)

## 2018-11-30 LAB — I-STAT TROPONIN, ED: Troponin i, poc: 0 ng/mL (ref 0.00–0.08)

## 2018-11-30 LAB — PROTIME-INR
INR: 0.9 (ref 0.8–1.2)
Prothrombin Time: 12.2 seconds (ref 11.4–15.2)

## 2018-11-30 LAB — I-STAT BETA HCG BLOOD, ED (MC, WL, AP ONLY): I-stat hCG, quantitative: <5 m[IU]/mL (ref <5)

## 2018-11-30 MED ORDER — OMEPRAZOLE 20 MG PO CPDR: 20.0000 mg | DELAYED_RELEASE_CAPSULE | Freq: Two times a day (BID) | ORAL | 0 refills | Status: DC

## 2018-11-30 MED ORDER — NAPROXEN 500 MG PO TABS: 500.0000 mg | ORAL_TABLET | Freq: Two times a day (BID) | ORAL | 0 refills | Status: DC

## 2018-11-30 MED ORDER — SODIUM CHLORIDE 0.9% FLUSH: 3.0000 mL | Freq: Once | INTRAVENOUS | Status: DC

## 2018-11-30 NOTE — Discharge Instructions (Signed)
Begin taking naproxen and Prilosec as prescribed.  Follow-up with cardiology if your symptoms persist through the weekend.  The contact information for the [facility] cardiology clinic has been provided in this discharge summary for you to call and make these arrangements.  Return to the emergency department in the meantime if symptoms significantly worsen or change.

## 2018-11-30 NOTE — ED Notes (Addendum)
Delivered initial EKG to EDP Delo HR was 90 Came back to triage and monitor was showing HR of 160 Pt was standing up, anxious, and asking "why does my heart feel like this? Am I going to be alright?" Pt assisted back to triage chair and encouraged to remain calm so another EKG could be completed 2nd and 3rd EKG completed Both given to [place] to triage rm Pt transferred to Res B

## 2018-11-30 NOTE — ED Triage Notes (Signed)
Pt complains of central chest pain that radiates to the right, while in triage her rate went in the 160's Pt states that she woke up feeling sweaty and shaky, she says that she felt really strange when her rate went up

## 2018-11-30 NOTE — ED Provider Notes (Signed)
[facility] DEPT Provider Note   CSN: [id number] Arrival date & time: 11/30/18  0305    History   Chief Complaint Chief Complaint  Patient presents with  . Tachycardia    HPI Dayzha Pogosyan is a 58 y.o. female.     Patient is a 58 year old female with past medical history including anemia, anxiety, depression, palpitations.  She presents today for evaluation of chest discomfort.  This began 2 weeks ago while patient was crawling on the floor.  She describes the sudden onset of a sharp pain in the center of her chest radiating to her right neck.  This has been present since that time and is worse when she changes position.  She denies any difficulty breathing, nausea, diaphoresis.  She tells me she had a heart catheterization in the past which was unremarkable.  The history is provided by the patient.    Past Medical History:  Diagnosis Date  . Anemia   . Anxiety   . Colon polyp 08/28/2012   Tubular adenoma  . Dense breast   . Depression   . Genital warts   . GERD (gastroesophageal reflux disease)   . Heart murmur   . Hemorrhoids   . Hx of cardiac catheterization 2009   normal coronary arteries  . Hx of menorrhagia    Novasure  . Hypokalemia   . Hypomagnesemia   . Hypophosphatemia   . Lactose intolerance in adult   . Mitral valve prolapse   . Normal cardiac stress test 03/09/2012   no ischemia  . Positive PPD   . Rapid heart beat    States "irreg heart beat"  . Thyroid disease    Thyroid nodules    Patient Active Problem List   Diagnosis Date Noted  . Chest heaviness 11/28/2018  . Atypical chest pain 11/28/2018  . Amenorrhea 10/31/2018  . Genital herpes simplex 10/31/2018  . Lesion of vulva 10/31/2018  . Menopausal flushing 10/31/2018  . Stress incontinence of urine 10/31/2018  . Cough 08/16/2018  . Fever and chills 08/16/2018  . Lower respiratory infection 08/16/2018  . History of diverticulosis 05/04/2017  .  Hypercalcemia 07/11/2013  . Hypocalcemia 07/05/2013  . Palpitations 07/05/2013  . Pes planus (flat feet) 03/29/2013  . History of PSVT (paroxysmal supraventricular tachycardia) 06/24/2012  . Hypokalemia 06/23/2012  . Hx of cardiac catheterization   . Positive PPD   . Multinodular goiter 05/12/2012  . SLEEP APNEA 01/19/2008  . Gastroesophageal reflux disease 01/18/2008  . DYSPEPSIA 01/18/2008  . IRRITABLE BOWEL SYNDROME 01/18/2008    Past Surgical History:  Procedure Laterality Date  . CARDIAC CATHETERIZATION     x2 with normal results per pt  . NOVASURE ABLATION    . UPPER GASTROINTESTINAL ENDOSCOPY  May 2013     OB History   No obstetric history on file.      Home Medications    Prior to Admission medications   Medication Sig Start Date End Date Taking? Authorizing Provider  CALCIUM PO Take 2 tablets by mouth daily.    [provider]  cholecalciferol (VITAMIN D) 1000 UNITS tablet Take 1,000 Units by mouth daily.     [provider]  famotidine (PEPCID) 20 MG tablet Take 1 tablet (20 mg total) by mouth 2 (two) times daily. 09/12/18   Ashley Murrain, NP  losartan (COZAAR) 25 MG tablet Take 25 mg by mouth daily.    [provider]  metoprolol tartrate (LOPRESSOR) 25 MG tablet Take 25  mg by mouth 2 (two) times daily. 12/29/15   [provider]  pantoprazole (PROTONIX) 40 MG tablet TAKE 1 TABLET BY MOUTH DAILY 10/27/18   Panosh, Standley Brooking, MD  PARoxetine HCl (PAXIL PO) Take by mouth. Prescribed by mental health provider. Could not tolerate paxil CR 37.5mg .    [provider]  potassium chloride SA (K-DUR,KLOR-CON) 20 MEQ tablet TAKE 1 TABLET BY MOUTH TWICE DAILY 07/05/18   Panosh, Standley Brooking, MD  TURMERIC PO Take by mouth daily.    [provider]  vitamin E (VITAMIN E) 400 UNIT capsule Take 400 Units by mouth daily.    [provider]    Family History Family History  Problem Relation Age of Onset  . Heart disease  Father        died Mi 18  . Hypertension Maternal Grandfather   . Diabetes Maternal Grandfather   . Heart disease Maternal Grandfather   . Colon cancer Maternal Grandfather   . Rectal cancer Maternal Grandfather   . Diabetes Maternal Grandmother   . Prostate cancer Neg Hx   . Breast cancer Neg Hx     Social History Social History   Tobacco Use  . Smoking status: Never Smoker  . Smokeless tobacco: Never Used  Substance Use Topics  . Alcohol use: No  . Drug use: No     Allergies   Ceftriaxone sodium and Doxycycline   Review of Systems Review of Systems  All other systems reviewed and are negative.    Physical Exam Updated Vital Signs BP 137/86 (BP Location: Right Arm)   Pulse 90   Temp 98.6 F (37 C) (Oral)   Resp 14   Ht 5\' 6"  (1.676 m)   Wt 91.2 kg   LMP 09/18/2016 (Exact Date)   SpO2 98%   BMI 32.44 kg/m   Physical Exam Vitals signs and nursing note reviewed.  Constitutional:      General: She is not in acute distress.    Appearance: She is well-developed. She is not diaphoretic.  HENT:     Head: Normocephalic and atraumatic.  Neck:     Musculoskeletal: Normal range of motion and neck supple.  Cardiovascular:     Rate and Rhythm: Normal rate and regular rhythm.     Heart sounds: No murmur. No friction rub. No gallop.   Pulmonary:     Effort: Pulmonary effort is normal. No respiratory distress.     Breath sounds: Normal breath sounds. No wheezing.  Abdominal:     General: Bowel sounds are normal. There is no distension.     Palpations: Abdomen is soft.     Tenderness: There is no abdominal tenderness.  Musculoskeletal: Normal range of motion.        General: No swelling or tenderness.     Right lower leg: No edema.     Left lower leg: No edema.  Skin:    General: Skin is warm and dry.  Neurological:     Mental Status: She is alert and oriented to person, place, and time.      ED Treatments / Results  Labs (all labs ordered are listed,  but only abnormal results are displayed) Labs Reviewed  CBC - Abnormal; Notable for the following components:      Result Value   RBC 5.54 (*)    MCV 79.6 (*)    MCH 23.5 (*)    MCHC 29.5 (*)    All other components within normal limits  PROTIME-INR  BASIC METABOLIC PANEL  I-STAT TROPONIN, ED  I-STAT BETA HCG BLOOD, ED ([facility], [facility], [facility] ONLY)    EKG EKG Interpretation  Date/Time:  Thursday November 30 2018 03:15:00 EDT Ventricular Rate:  86 PR Interval:    QRS Duration: 80 QT Interval:  361 QTC Calculation: 432 R Axis:   43 Text Interpretation:  Sinus rhythm Borderline repolarization abnormality Confirmed by Veryl Speak [phone]) on 11/30/2018 3:19:03 AM   Radiology No results found.  Procedures Procedures (including critical care time)  Medications Ordered in ED Medications  sodium chloride flush (NS) 0.9 % injection 3 mL (has no administration in time range)     Initial Impression / Assessment and Plan / ED Course  I have reviewed the triage vital signs and the nursing notes.  Pertinent labs & imaging results that were available during my care of the patient were reviewed by me and considered in my medical decision making (see chart for details).  Patient presents here with complaints of chest discomfort.  This has been going on for the past 2 weeks and she was crawling on her hands and knees on the floor.  Her symptoms sound very musculoskeletal in nature.  It is worse when she changes position and moves.  Her work-up is unremarkable including unchanged EKG and negative troponin.  I highly doubt a cardiac etiology and believe this to be musculoskeletal.  She is also describing some epigastric burning.  Reflux may also be contributing to her discomfort.  Patient did have an episode of palpitations while in triage.  She went into a sinus tach into the 150s.  She tells me that this is not unusual for her head and she does take metoprolol for this same issue.  I do not feel as  though any further work-up is indicated into this.  Her heart rate has been in the 80s since she has been in her exam room.  Patient is to follow-up with cardiology if she is not feeling better with anti-inflammatories and Prilosec.  Final Clinical Impressions(s) / ED Diagnoses   Final diagnoses:  None    ED Discharge Orders    None       Veryl Speak, MD 11/30/18 [phone]

## 2018-12-05 ENCOUNTER — Ambulatory Visit: Payer: BLUE CROSS/BLUE SHIELD | Admitting: Podiatry

## 2019-01-03 DIAGNOSIS — N939 Abnormal uterine and vaginal bleeding, unspecified: Secondary | ICD-10-CM | POA: Diagnosis not present

## 2019-01-09 ENCOUNTER — Ambulatory Visit: Payer: BLUE CROSS/BLUE SHIELD | Admitting: Podiatry

## 2019-01-15 DIAGNOSIS — Z113 Encounter for screening for infections with a predominantly sexual mode of transmission: Secondary | ICD-10-CM | POA: Diagnosis not present

## 2019-01-15 DIAGNOSIS — N939 Abnormal uterine and vaginal bleeding, unspecified: Secondary | ICD-10-CM | POA: Diagnosis not present

## 2019-01-23 ENCOUNTER — Telehealth: Payer: Self-pay

## 2019-01-23 NOTE — Telephone Encounter (Signed)
Spoke with the patient and she has given verbal consent to do a doxy[employer]me visit and to file her insurance. E-mail has been confirmed and sent.  E-mail: [name]

## 2019-01-24 ENCOUNTER — Ambulatory Visit: Payer: BLUE CROSS/BLUE SHIELD | Admitting: Adult Health

## 2019-01-24 ENCOUNTER — Ambulatory Visit: Payer: BLUE CROSS/BLUE SHIELD | Admitting: Neurology

## 2019-01-24 ENCOUNTER — Other Ambulatory Visit: Payer: Self-pay

## 2019-01-24 DIAGNOSIS — M545 Low back pain: Secondary | ICD-10-CM | POA: Diagnosis not present

## 2019-01-24 DIAGNOSIS — M25561 Pain in right knee: Secondary | ICD-10-CM | POA: Diagnosis not present

## 2019-01-24 DIAGNOSIS — R002 Palpitations: Secondary | ICD-10-CM | POA: Diagnosis not present

## 2019-01-24 DIAGNOSIS — E663 Overweight: Secondary | ICD-10-CM | POA: Diagnosis not present

## 2019-01-24 NOTE — Progress Notes (Deleted)
Virtual Visit via Video Note  I connected with [name] on 01/24/19 at  9:45 AM EDT by a video enabled telemedicine application and verified that I am speaking with the correct person using two identifiers.  Location: Patient: *** Provider: ***   I discussed the limitations of evaluation and management by telemedicine and the availability of in person appointments. The patient expressed understanding and agreed to proceed.  History of Present Illness: 01/24/2019 SS: [name] is a 58 year old female with history of memory disturbance she claims is been present since 2007 or 2008. At her last visit in August 2019 her MMSE was perfect at 30/30. She was referred to speech therapy to undergo cognitive therapy.She had MRI of the brain in September 2019, was unremarkable.    05/10/2018 Dr. Jannifer Franklin: [name] is a 58 year old left-handed black female with a history of a memory disturbance that she claims has been present since around 2007 or 2008.  The patient was seen by another neurologist, she cannot remember the name of the doctor.  The patient has had ongoing issues over many years, she believes it may have gotten gradually worse over time.  The patient reports that she has had a significant issue with underlying anxiety, she has had a panic disorder in the past, but she has not had any recent panic attacks.  She is followed through mental health for this, she is on Paxil and she is on a beta-blocker which may also help anxiety.  The patient indicates that she usually sleeps fairly well at night, but not always.  She does have some chronic neck and low back pain which may keep her awake.  The patient is managing a business, she has a high intensity of information input throughout the day and she finds it difficult to keep up.  She has difficulty focusing.  She may misplace things about the house frequently.  She is able to operate a motor vehicle without difficulty, she manages her  finances well, she is able to keep up with medications and appointments.  She has not given up any activities of daily living because of memory.  The patient denies any focal numbness or weakness of the face, arms, legs.  She does report some mild balance changes.  She claims that her mother who is in her 76s does have some slight memory troubles, her maternal grandmother had Alzheimer's disease.  The patient is sent to this office for an evaluation.  She has recently had blood work to include a thyroid profile and a vitamin B12 level that were unremarkable   Observations/Objective:   Assessment and Plan:   Follow Up Instructions:    I discussed the assessment and treatment plan with the patient. The patient was provided an opportunity to ask questions and all were answered. The patient agreed with the plan and demonstrated an understanding of the instructions.   The patient was advised to call back or seek an in-person evaluation if the symptoms worsen or if the condition fails to improve as anticipated.  I provided *** minutes of non-face-to-face time during this encounter.   Suzzanne Cloud, NP

## 2019-01-25 ENCOUNTER — Telehealth: Payer: Self-pay | Admitting: Neurology

## 2019-01-25 ENCOUNTER — Other Ambulatory Visit: Payer: Self-pay

## 2019-01-25 ENCOUNTER — Encounter: Payer: BLUE CROSS/BLUE SHIELD | Admitting: Neurology

## 2019-01-25 NOTE — Telephone Encounter (Signed)
I contacted [name] today for her video visit. She says she has a lot going on today. She is going to need to reschedule. She will call our office when she is ready. She indicates she is doing well.

## 2019-01-25 NOTE — Progress Notes (Signed)
This encounter was created in error - please disregard.

## 2019-02-26 ENCOUNTER — Ambulatory Visit: Payer: BLUE CROSS/BLUE SHIELD | Admitting: Registered Nurse

## 2019-02-27 ENCOUNTER — Encounter: Payer: Self-pay | Admitting: Registered Nurse

## 2019-02-28 DIAGNOSIS — F411 Generalized anxiety disorder: Secondary | ICD-10-CM | POA: Diagnosis not present

## 2019-02-28 DIAGNOSIS — F331 Major depressive disorder, recurrent, moderate: Secondary | ICD-10-CM | POA: Diagnosis not present

## 2019-02-28 DIAGNOSIS — F4310 Post-traumatic stress disorder, unspecified: Secondary | ICD-10-CM | POA: Diagnosis not present

## 2019-03-02 ENCOUNTER — Other Ambulatory Visit: Payer: Self-pay | Admitting: Internal Medicine

## 2019-03-16 ENCOUNTER — Other Ambulatory Visit: Payer: Self-pay | Admitting: Internal Medicine

## 2019-03-19 ENCOUNTER — Other Ambulatory Visit: Payer: Self-pay | Admitting: Emergency Medicine

## 2019-03-21 ENCOUNTER — Ambulatory Visit: Payer: BC Managed Care – PPO | Admitting: Podiatry

## 2019-03-21 ENCOUNTER — Ambulatory Visit (INDEPENDENT_AMBULATORY_CARE_PROVIDER_SITE_OTHER): Payer: BC Managed Care – PPO

## 2019-03-21 ENCOUNTER — Other Ambulatory Visit: Payer: Self-pay | Admitting: Podiatry

## 2019-03-21 ENCOUNTER — Other Ambulatory Visit: Payer: Self-pay

## 2019-03-21 DIAGNOSIS — G5763 Lesion of plantar nerve, bilateral lower limbs: Secondary | ICD-10-CM

## 2019-03-21 DIAGNOSIS — G5761 Lesion of plantar nerve, right lower limb: Secondary | ICD-10-CM

## 2019-03-21 DIAGNOSIS — G5762 Lesion of plantar nerve, left lower limb: Secondary | ICD-10-CM

## 2019-03-21 DIAGNOSIS — G5790 Unspecified mononeuropathy of unspecified lower limb: Secondary | ICD-10-CM | POA: Diagnosis not present

## 2019-03-21 DIAGNOSIS — M79672 Pain in left foot: Secondary | ICD-10-CM

## 2019-03-21 DIAGNOSIS — M79671 Pain in right foot: Secondary | ICD-10-CM

## 2019-03-21 MED ORDER — MELOXICAM 15 MG PO TABS: 15.0000 mg | ORAL_TABLET | Freq: Every day | ORAL | 1 refills | Status: DC

## 2019-03-23 ENCOUNTER — Other Ambulatory Visit: Payer: Self-pay

## 2019-03-23 ENCOUNTER — Telehealth: Payer: Self-pay | Admitting: Emergency Medicine

## 2019-03-23 MED ORDER — POTASSIUM CHLORIDE CRYS ER 20 MEQ PO TBCR: 40.0000 meq | EXTENDED_RELEASE_TABLET | Freq: Every day | ORAL | 3 refills | Status: DC

## 2019-03-23 NOTE — Telephone Encounter (Signed)
Medication: potassium chloride SA (K-DUR,KLOR-CON) 20 MEQ tablet     Patient is requesting refill of this medication. She states she is completely out. Pharmacy error has caused patient to not be able to pick up this medication. Patient is requesting call back from [place], as she needs this medication today.    Pharmacy:  [facility] [place], [place] AT [facility] [address] [place] [phone] (Phone) [phone] (Fax

## 2019-03-23 NOTE — Telephone Encounter (Signed)
Rx was sent to pharmacy. 

## 2019-03-26 NOTE — Progress Notes (Signed)
   HPI: 58 year old female presenting today for follow up evaluation of bilateral forefoot pain. She states the pain is different than it has been at previous visits but in the same location. She has taken a Medrol Dose Pak and Meloxicam in the past for pain. Standing and walking for long periods of time increases the pain. Patient is here for further evaluation and treatment.  Past Medical History:  Diagnosis Date  . Anemia   . Anxiety   . Colon polyp 08/28/2012   Tubular adenoma  . Dense breast   . Depression   . Genital warts   . GERD (gastroesophageal reflux disease)   . Heart murmur   . Hemorrhoids   . Hx of cardiac catheterization 2009   normal coronary arteries  . Hx of menorrhagia    Novasure  . Hypokalemia   . Hypomagnesemia   . Hypophosphatemia   . Lactose intolerance in adult   . Mitral valve prolapse   . Normal cardiac stress test 03/09/2012   no ischemia  . Positive PPD   . Rapid heart beat    States "irreg heart beat"  . Thyroid disease    Thyroid nodules     Physical Exam: General: The patient is alert and oriented x3 in no acute distress.  Dermatology: Skin is warm, dry and supple bilateral lower extremities. Negative for open lesions or macerations.  Vascular: Palpable pedal pulses bilaterally. No edema or erythema noted. Capillary refill within normal limits.  Neurological: Epicritic and protective threshold grossly intact bilaterally.   Musculoskeletal Exam: Sharp pain with palpation of the 2nd interspace and lateral compression of the metatarsal heads consistent with neuroma.  Positive Conley Canal sign with loadbearing of the forefoot.  Radiographic Exam:  Normal osseous mineralization. Joint spaces preserved. No fracture/dislocation/boney destruction.    Assessment: 1.  Morton's neuroma 2nd interspace bilateral feet   Plan of Care:  1. Patient was evaluated. X-Rays reviewed.  2. Injection of 0.5 mLs Celestone Soluspan injected into the 2nd  interspace of bilateral feet.  3. Offloading met pads dispensed.  4. Recommended wide fitting shoes.  5. Prescription for Meloxicam provided to patient. 6. Return to clinic in 4 weeks.    Edrick Kins, DPM [facility]  Dr. Edrick Kins, [place]                                        [place], [place] [address]                Office [phone]  Fax [phone]

## 2019-04-17 DIAGNOSIS — F4310 Post-traumatic stress disorder, unspecified: Secondary | ICD-10-CM | POA: Diagnosis not present

## 2019-04-17 DIAGNOSIS — F411 Generalized anxiety disorder: Secondary | ICD-10-CM | POA: Diagnosis not present

## 2019-04-17 DIAGNOSIS — F331 Major depressive disorder, recurrent, moderate: Secondary | ICD-10-CM | POA: Diagnosis not present

## 2019-04-18 ENCOUNTER — Ambulatory Visit: Payer: BC Managed Care – PPO | Admitting: Podiatry

## 2019-04-18 ENCOUNTER — Encounter: Payer: Self-pay | Admitting: Podiatry

## 2019-04-18 ENCOUNTER — Other Ambulatory Visit: Payer: Self-pay

## 2019-04-18 VITALS — Temp 98.0°F

## 2019-04-18 DIAGNOSIS — G5763 Lesion of plantar nerve, bilateral lower limbs: Secondary | ICD-10-CM

## 2019-04-21 NOTE — Progress Notes (Signed)
   HPI: 58 year old female presenting today for follow up evaluation of a Morton's neuroma of the 2nd interspace bilaterally. She states the injections provided relief only for the day she received it. She states the pain returned the next day but it was not as severe. She has not been taking the Meloxicam. There are no modifying factors noted. Patient is here for further evaluation and treatment.   Past Medical History:  Diagnosis Date  . Anemia   . Anxiety   . Colon polyp 08/28/2012   Tubular adenoma  . Dense breast   . Depression   . Genital warts   . GERD (gastroesophageal reflux disease)   . Heart murmur   . Hemorrhoids   . Hx of cardiac catheterization 2009   normal coronary arteries  . Hx of menorrhagia    Novasure  . Hypokalemia   . Hypomagnesemia   . Hypophosphatemia   . Lactose intolerance in adult   . Mitral valve prolapse   . Normal cardiac stress test 03/09/2012   no ischemia  . Positive PPD   . Rapid heart beat    States "irreg heart beat"  . Thyroid disease    Thyroid nodules     Physical Exam: General: The patient is alert and oriented x3 in no acute distress.  Dermatology: Skin is warm, dry and supple bilateral lower extremities. Negative for open lesions or macerations.  Vascular: Palpable pedal pulses bilaterally. No edema or erythema noted. Capillary refill within normal limits.  Neurological: Epicritic and protective threshold grossly intact bilaterally.   Musculoskeletal Exam: Sharp pain with palpation of the 2nd interspace and lateral compression of the metatarsal heads consistent with neuroma.  Positive Conley Canal sign with loadbearing of the forefoot.  Assessment: 1.  Morton's neuroma 2nd interspace bilateral feet - improved    Plan of Care:  1. Patient was evaluated. 2. Patient does not want to take the Meloxicam.  3. Continue taking tumeric daily.  4. Recommended good, wide fitting sneakers with arch supports.  5. Return to clinic as  needed.    Edrick Kins, DPM [facility]  Dr. Edrick Kins, [place]                                        [place], [place] [address]                Office [phone]  Fax [phone]

## 2019-04-23 DIAGNOSIS — H25013 Cortical age-related cataract, bilateral: Secondary | ICD-10-CM | POA: Diagnosis not present

## 2019-04-27 DIAGNOSIS — Z01419 Encounter for gynecological examination (general) (routine) without abnormal findings: Secondary | ICD-10-CM | POA: Diagnosis not present

## 2019-04-27 DIAGNOSIS — Z1389 Encounter for screening for other disorder: Secondary | ICD-10-CM | POA: Diagnosis not present

## 2019-04-27 DIAGNOSIS — Z130 Encounter for screening for diseases of the blood and blood-forming organs and certain disorders involving the immune mechanism: Secondary | ICD-10-CM | POA: Diagnosis not present

## 2019-04-27 DIAGNOSIS — Z6832 Body mass index (BMI) 32.0-32.9, adult: Secondary | ICD-10-CM | POA: Diagnosis not present

## 2019-05-17 ENCOUNTER — Other Ambulatory Visit: Payer: Self-pay | Admitting: Emergency Medicine

## 2019-05-17 NOTE — Telephone Encounter (Signed)
Forwarding medication refill request to the clinical pool for review. 

## 2019-05-24 ENCOUNTER — Other Ambulatory Visit: Payer: Self-pay | Admitting: Emergency Medicine

## 2019-05-24 NOTE — Telephone Encounter (Signed)
Requested medication (s) are due for refill today: yes  Requested medication (s) are on the active medication list: yes  Last refill:  04/09/2019  Future visit scheduled: no  Notes to clinic: review for refill   Requested Prescriptions  Pending Prescriptions Disp Refills   potassium chloride SA (K-DUR) 20 MEQ tablet [Pharmacy Med Name: POTASSIUM CL 20MEQ ER TABLETS] 30 tablet 3    Sig: TAKE 2 TABLETS(40 MEQ) BY MOUTH DAILY     Endocrinology:  Minerals - Potassium Supplementation Failed - 05/24/2019  2:29 PM      Failed - K in normal range and within 360 days    Potassium  Date Value Ref Range Status  11/30/2018 3.0 (L) 3.5 - 5.1 mmol/L Final         Passed - Cr in normal range and within 360 days    Creatinine, Ser  Date Value Ref Range Status  11/30/2018 1.00 0.44 - 1.00 mg/dL Final         Passed - Valid encounter within last 12 months    Recent Outpatient Visits          5 months ago Chest heaviness   [facility], Ines Bloomer, MD   7 months ago Cough   [facility] at [facility], Ranell Patrick, MD   9 months ago Cough   [facility] at [facility], Ines Bloomer, MD   12 months ago Routine general medical examination at a health care facility   [place] [facility], Ines Bloomer, MD   1 year ago Complaints of memory disturbance   [place] at [facility], Standley Brooking, MD

## 2019-05-25 ENCOUNTER — Other Ambulatory Visit: Payer: Self-pay

## 2019-05-25 ENCOUNTER — Encounter: Payer: Self-pay | Admitting: Family Medicine

## 2019-05-25 ENCOUNTER — Ambulatory Visit (INDEPENDENT_AMBULATORY_CARE_PROVIDER_SITE_OTHER): Payer: BC Managed Care – PPO | Admitting: Family Medicine

## 2019-05-25 VITALS — BP 111/68 | HR 65 | Temp 98.1°F | Resp 14 | Wt 199.4 lb

## 2019-05-25 DIAGNOSIS — M542 Cervicalgia: Secondary | ICD-10-CM

## 2019-05-25 DIAGNOSIS — M7918 Myalgia, other site: Secondary | ICD-10-CM

## 2019-05-25 DIAGNOSIS — E876 Hypokalemia: Secondary | ICD-10-CM

## 2019-05-25 DIAGNOSIS — R002 Palpitations: Secondary | ICD-10-CM

## 2019-05-25 MED ORDER — CYCLOBENZAPRINE HCL 10 MG PO TABS: 10.0000 mg | ORAL_TABLET | Freq: Three times a day (TID) | ORAL | 0 refills | Status: DC | PRN

## 2019-05-25 MED ORDER — POTASSIUM CHLORIDE CRYS ER 20 MEQ PO TBCR: 40.0000 meq | EXTENDED_RELEASE_TABLET | Freq: Every day | ORAL | 1 refills | Status: DC

## 2019-05-25 NOTE — Patient Instructions (Addendum)
Flexeril refilled for now, but if neck pain persists or more frequent - follow up with primary care provider.   I will check potassium level today. If persistently low, follow up with primary provider as other conditions can cause low potassium and hypertension.  Heart rate was low on the EKG today, but I do not see other changes from previous EKG.  If any new heart palpitations, lightheadedness, dizziness, new fatigue or other new symptoms be seen in the emergency room right away.  Follow-up with Dr. Mitchel Honour in the next 1 month for recheck.  Return to the clinic or go to the nearest emergency room if any of your symptoms worsen or new symptoms occur.   Hypokalemia Hypokalemia means that the amount of potassium in the blood is lower than normal. Potassium is a chemical (electrolyte) that helps regulate the amount of fluid in the body. It also stimulates muscle tightening (contraction) and helps nerves work properly. Normally, most of the body's potassium is inside cells, and only a very small amount is in the blood. Because the amount in the blood is so small, minor changes to potassium levels in the blood can be life-threatening. What are the causes? This condition may be caused by:  Antibiotic medicine.  Diarrhea or vomiting. Taking too much of a medicine that helps you have a bowel movement (laxative) can cause diarrhea and lead to hypokalemia.  Chronic kidney disease (CKD).  Medicines that help the body get rid of excess fluid (diuretics).  Eating disorders, such as bulimia.  Low magnesium levels in the body.  Sweating a lot. What are the signs or symptoms? Symptoms of this condition include:  Weakness.  Constipation.  Fatigue.  Muscle cramps.  Mental confusion.  Skipped heartbeats or irregular heartbeat (palpitations).  Tingling or numbness. How is this diagnosed? This condition is diagnosed with a blood test. How is this treated? This condition may be treated  by:  Taking potassium supplements by mouth.  Adjusting the medicines that you take.  Eating more foods that contain a lot of potassium. If your potassium level is very low, you may need to get potassium through an IV and be monitored in the hospital. Follow these instructions at home:   Take over-the-counter and prescription medicines only as told by your health care provider. This includes vitamins and supplements.  Eat a healthy diet. A healthy diet includes fresh fruits and vegetables, whole grains, healthy fats, and lean proteins.  If instructed, eat more foods that contain a lot of potassium. This includes: ? Nuts, such as peanuts and pistachios. ? Seeds, such as sunflower seeds and pumpkin seeds. ? Peas, lentils, and lima beans. ? Whole grain and bran cereals and breads. ? Fresh fruits and vegetables, such as apricots, avocado, bananas, cantaloupe, kiwi, oranges, tomatoes, asparagus, and potatoes. ? Orange juice. ? Tomato juice. ? Red meats. ? Yogurt.  Keep all follow-up visits as told by your health care provider. This is important. Contact a health care provider if you:  Have weakness that gets worse.  Feel your heart pounding or racing.  Vomit.  Have diarrhea.  Have diabetes (diabetes mellitus) and you have trouble keeping your blood sugar (glucose) in your target range. Get help right away if you:  Have chest pain.  Have shortness of breath.  Have vomiting or diarrhea that lasts for more than 2 days.  Faint. Summary  Hypokalemia means that the amount of potassium in the blood is lower than normal.  This condition is  diagnosed with a blood test.  Hypokalemia may be treated by taking potassium supplements, adjusting the medicines that you take, or eating more foods that are high in potassium.  If your potassium level is very low, you may need to get potassium through an IV and be monitored in the hospital. This information is not intended to replace  advice given to you by your health care provider. Make sure you discuss any questions you have with your health care provider. Document Released: 08/30/2005 Document Revised: 04/12/2018 Document Reviewed: 04/12/2018 Elsevier Patient Education  [employer].    If you have lab work done today you will be contacted with your lab results within the next 2 weeks.  If you have not heard from [place] then please contact us. The fastest way to get your results is to register for My Chart.   IF you received an x-ray today, you will receive an invoice from [facility]. Please contact [facility] at [phone] with questions or concerns regarding your invoice.   IF you received labwork today, you will receive an invoice from [facility]. Please contact [facility] at [phone] with questions or concerns regarding your invoice.   Our billing staff will not be able to assist you with questions regarding bills from these companies.  You will be contacted with the lab results as soon as they are available. The fastest way to get your results is to activate your My Chart account. Instructions are located on the last page of this paperwork. If you have not heard from [place] regarding the results in 2 weeks, please contact this office.

## 2019-05-25 NOTE — Progress Notes (Signed)
Subjective:    Patient ID: [name], female    DOB: April 27, 1961, 58 y.o.   MRN: [id number]  HPI [name] is a 58 y.o. female Presents today for: Chief Complaint  Patient presents with  . Medication Refill    Patient need a refill on her potassium and was informed she needed labs  . Neck Pain    Have hx of neck pain due to a bulging disc. Would like a refill on muscle relaxer   [id number], Ines Bloomer, MD  Hypokalemia: Takes potassium chloride/K-Dur 20 milliequivalent tablet, 2 tablets daily.  Most recent refill July 10 History of hypertension on losartan, metoprolol. Last checked her potassium in March, low at that time at 3.0. longstanding issue for 7 years.  Same dose of 39meq QD for years.  Misses dose about once per week.  Feels some palpitations/different beat if skipping meds, or if low.  Occasional palpitations, not daily usually, some past week. Fleeting symptoms of occasional different beat.  No chest pain.   Lab Results  Component Value Date   NA 142 11/30/2018   K 3.0 (L) 11/30/2018   CL 106 11/30/2018   CO2 27 11/30/2018   BP Readings from Last 3 Encounters:  05/25/19 111/68  11/30/18 (!) 113/53  11/28/18 102/60     Neck pain: Cervical spine MRI from October 2003 reviewed.  Minimal disc bulge at C3-4 and C4-5 with mild central spinal narrowing.  Episodic neck pain flares, was treated with muscle relaxants in the past.  Requests refill.  Flares about 2 times per year. Flexeril helps.  Holding grandbaby.  Some soreness recently, tingling in hands in am resolves. No weakness.  Tx: nsaid, heat.    Patient Active Problem List   Diagnosis Date Noted  . Chest heaviness 11/28/2018  . Atypical chest pain 11/28/2018  . Amenorrhea 10/31/2018  . Genital herpes simplex 10/31/2018  . Lesion of vulva 10/31/2018  . Menopausal flushing 10/31/2018  . Stress incontinence of urine 10/31/2018  . Cough 08/16/2018  . Fever and chills  08/16/2018  . Lower respiratory infection 08/16/2018  . History of diverticulosis 05/04/2017  . Hypercalcemia 07/11/2013  . Hypocalcemia 07/05/2013  . Palpitations 07/05/2013  . Pes planus (flat feet) 03/29/2013  . History of PSVT (paroxysmal supraventricular tachycardia) 06/24/2012  . Hypokalemia 06/23/2012  . Hx of cardiac catheterization   . Positive PPD   . Multinodular goiter 05/12/2012  . SLEEP APNEA 01/19/2008  . Gastroesophageal reflux disease 01/18/2008  . DYSPEPSIA 01/18/2008  . IRRITABLE BOWEL SYNDROME 01/18/2008   Past Medical History:  Diagnosis Date  . Anemia   . Anxiety   . Colon polyp 08/28/2012   Tubular adenoma  . Dense breast   . Depression   . Genital warts   . GERD (gastroesophageal reflux disease)   . Heart murmur   . Hemorrhoids   . Hx of cardiac catheterization 2009   normal coronary arteries  . Hx of menorrhagia    Novasure  . Hypokalemia   . Hypomagnesemia   . Hypophosphatemia   . Lactose intolerance in adult   . Mitral valve prolapse   . Normal cardiac stress test 03/09/2012   no ischemia  . Positive PPD   . Rapid heart beat    States "irreg heart beat"  . Thyroid disease    Thyroid nodules   Past Surgical History:  Procedure Laterality Date  . CARDIAC CATHETERIZATION     x2 with normal results per pt  .  NOVASURE ABLATION    . UPPER GASTROINTESTINAL ENDOSCOPY  May 2013   Allergies  Allergen Reactions  . Ceftriaxone Sodium Itching  . Doxycycline Rash   Prior to Admission medications   Medication Sig Start Date End Date Taking? Authorizing Provider  CALCIUM PO Take 2 tablets by mouth daily.   Yes [provider]  cholecalciferol (VITAMIN D) 1000 UNITS tablet Take 2,000 Units by mouth daily.    Yes [provider]  losartan (COZAAR) 25 MG tablet Take 25 mg by mouth daily.   Yes [provider]  metoprolol tartrate (LOPRESSOR) 25 MG tablet Take 25 mg by mouth 2 (two) times daily. 12/29/15  Yes [provider]  PARoxetine HCl (PAXIL PO) Take 25 mg by mouth daily. Prescribed by mental health provider. Could not tolerate paxil CR 37.5mg .    Yes [provider]  potassium chloride SA (K-DUR) 20 MEQ tablet Take 2 tablets (40 mEq total) by mouth daily. 03/23/19  Yes SagardiaInes Bloomer, MD   Social History   Socioeconomic History  . Marital status: Divorced    Spouse name: Not on file  . Number of children: 2  . Years of education: 40  . Highest education level: Not on file  Occupational History  . Occupation: dialysis Engineer, production: [employer]  Social Needs  . Financial resource strain: Not on file  . Food insecurity    Worry: Not on file    Inability: Not on file  . Transportation needs    Medical: Not on file    Non-medical: Not on file  Tobacco Use  . Smoking status: Never Smoker  . Smokeless tobacco: Never Used  Substance and Sexual Activity  . Alcohol use: No  . Drug use: No  . Sexual activity: Yes  Lifestyle  . Physical activity    Days per week: Not on file    Minutes per session: Not on file  . Stress: Not on file  Relationships  . Social Herbalist on phone: Not on file    Gets together: Not on file    Attends religious service: Not on file    Active member of club or organization: Not on file    Attends meetings of clubs or organizations: Not on file    Relationship status: Not on file  . Intimate partner violence    Fear of current or ex partner: Not on file    Emotionally abused: Not on file    Physically abused: Not on file    Forced sexual activity: Not on file  Other Topics Concern  . Not on file  Social History Narrative   Ms. 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 She lived in [place] and in [place] for a number of years her mom is from [place]. 16 years of education went to college  at [employer] lives at home with her son who is in his 75s no pets   Neg ets tob etoh hx PA   6 hours of sleep   G2P2    TD2010  colonoscopy 2009   Now running  own business    HH of 2    Left handed    Caffeine use: tea sometimes          Review of Systems Per HPi.     Objective:  Physical Exam Vitals signs reviewed.  Constitutional:      Appearance: She is well-developed.  HENT:     Head: Normocephalic and atraumatic.  Eyes:     Conjunctiva/sclera: Conjunctivae normal.     Pupils: Pupils are equal, round, and reactive to light.  Neck:     Vascular: No carotid bruit.  Cardiovascular:     Rate and Rhythm: Normal rate and regular rhythm.     Heart sounds: Normal heart sounds.     Comments: Regular, no ectopy.  Pulmonary:     Effort: Pulmonary effort is normal.     Breath sounds: Normal breath sounds.  Abdominal:     Palpations: Abdomen is soft. There is no pulsatile mass.     Tenderness: There is no abdominal tenderness.  Skin:    General: Skin is warm and dry.  Neurological:     Mental Status: She is alert and oriented to person, place, and time.  Psychiatric:        Behavior: Behavior normal.    Vitals:   05/25/19 1019  BP: 111/68  Pulse: 65  Resp: 14  Temp: 98.1 F (36.7 C)  TempSrc: Oral  SpO2: 97%  Weight: 199 lb 6.4 oz (90.4 kg)   EKG: Atrial bradycardia rate of 54.  No apparent changes from March 19.     Assessment & Plan:    [name] is a 58 y.o. female Neck pain Musculoskeletal pain - Plan: cyclobenzaprine (FLEXERIL) 10 MG tablet  -Intermittent symptoms.  Symptomatic care discussed, Flexeril if needed with risks of meds discussed.  RTC precautions if worsening or more frequent.  Hypokalemia - Plan: potassium chloride SA (K-DUR) 20 MEQ tablet, Basic metabolic panel, EKG [id number]  -Continue potassium supplement, check BMP.  Based on low reading previously and palpitations EKG obtained without apparent acute findings.  Consider  hyperaldosteronism if persistent with hypertension.  Depending on levels can follow-up with primary care provider to discuss further next month.  Palpitations - Plan: EKG 12-Lead, TSH  As above, intermittent/fleeting symptoms.  Check TSH, BMP with potassium.  Meds ordered this encounter  Medications  . potassium chloride SA (K-DUR) 20 MEQ tablet    Sig: Take 2 tablets (40 mEq total) by mouth daily.    Dispense:  180 tablet    Refill:  1  . cyclobenzaprine (FLEXERIL) 10 MG tablet    Sig: Take 1 tablet (10 mg total) by mouth 3 (three) times daily as needed for muscle spasms.    Dispense:  30 tablet    Refill:  0   Patient Instructions     Flexeril refilled for now, but if neck pain persists or more frequent - follow up with primary care provider.   I will check potassium level today. If persistently low, follow up with primary provider as other conditions can cause low potassium and hypertension.  Heart rate was low on the EKG today, but I do not see other changes from previous EKG.  If any new heart palpitations, lightheadedness, dizziness, new fatigue or other new symptoms be seen in the emergency room right away.  Follow-up with Dr. Mitchel Honour in the next 1 month for recheck.  Return to the clinic or go to the nearest emergency room if any of your symptoms worsen or new symptoms occur.   Hypokalemia Hypokalemia means that the amount of potassium in the blood is lower than normal. Potassium is a chemical (electrolyte) that helps regulate the amount of fluid in the body. It also  stimulates muscle tightening (contraction) and helps nerves work properly. Normally, most of the body's potassium is inside cells, and only a very small amount is in the blood. Because the amount in the blood is so small, minor changes to potassium levels in the blood can be life-threatening. What are the causes? This condition may be caused by:  Antibiotic medicine.  Diarrhea or vomiting. Taking too much of  a medicine that helps you have a bowel movement (laxative) can cause diarrhea and lead to hypokalemia.  Chronic kidney disease (CKD).  Medicines that help the body get rid of excess fluid (diuretics).  Eating disorders, such as bulimia.  Low magnesium levels in the body.  Sweating a lot. What are the signs or symptoms? Symptoms of this condition include:  Weakness.  Constipation.  Fatigue.  Muscle cramps.  Mental confusion.  Skipped heartbeats or irregular heartbeat (palpitations).  Tingling or numbness. How is this diagnosed? This condition is diagnosed with a blood test. How is this treated? This condition may be treated by:  Taking potassium supplements by mouth.  Adjusting the medicines that you take.  Eating more foods that contain a lot of potassium. If your potassium level is very low, you may need to get potassium through an IV and be monitored in the hospital. Follow these instructions at home:   Take over-the-counter and prescription medicines only as told by your health care provider. This includes vitamins and supplements.  Eat a healthy diet. A healthy diet includes fresh fruits and vegetables, whole grains, healthy fats, and lean proteins.  If instructed, eat more foods that contain a lot of potassium. This includes: ? Nuts, such as peanuts and pistachios. ? Seeds, such as sunflower seeds and pumpkin seeds. ? Peas, lentils, and lima beans. ? Whole grain and bran cereals and breads. ? Fresh fruits and vegetables, such as apricots, avocado, bananas, cantaloupe, kiwi, oranges, tomatoes, asparagus, and potatoes. ? Orange juice. ? Tomato juice. ? Red meats. ? Yogurt.  Keep all follow-up visits as told by your health care provider. This is important. Contact a health care provider if you:  Have weakness that gets worse.  Feel your heart pounding or racing.  Vomit.  Have diarrhea.  Have diabetes (diabetes mellitus) and you have trouble keeping  your blood sugar (glucose) in your target range. Get help right away if you:  Have chest pain.  Have shortness of breath.  Have vomiting or diarrhea that lasts for more than 2 days.  Faint. Summary  Hypokalemia means that the amount of potassium in the blood is lower than normal.  This condition is diagnosed with a blood test.  Hypokalemia may be treated by taking potassium supplements, adjusting the medicines that you take, or eating more foods that are high in potassium.  If your potassium level is very low, you may need to get potassium through an IV and be monitored in the hospital. This information is not intended to replace advice given to you by your health care provider. Make sure you discuss any questions you have with your health care provider. Document Released: 08/30/2005 Document Revised: 04/12/2018 Document Reviewed: 04/12/2018 Elsevier Patient Education  [employer].    If you have lab work done today you will be contacted with your lab results within the next 2 weeks.  If you have not heard from [place] then please contact us. The fastest way to get your results is to register for My Chart.   IF you received an x-ray today,  you will receive an invoice from [facility]. Please contact [facility] at [phone] with questions or concerns regarding your invoice.   IF you received labwork today, you will receive an invoice from [facility]. Please contact [facility] at [phone] with questions or concerns regarding your invoice.   Our billing staff will not be able to assist you with questions regarding bills from these companies.  You will be contacted with the lab results as soon as they are available. The fastest way to get your results is to activate your My Chart account. Instructions are located on the last page of this paperwork. If you have not heard from [place] regarding the results in 2 weeks, please contact this office.       Signed,    Merri Ray, MD [facility].  05/25/19 11:41 AM

## 2019-05-26 LAB — BASIC METABOLIC PANEL
BUN/Creatinine Ratio: 13 (ref 9–23)
BUN: 10 mg/dL (ref 6–24)
CO2: 24 mmol/L (ref 20–29)
Calcium: 9.9 mg/dL (ref 8.7–10.2)
Chloride: 105 mmol/L (ref 96–106)
Creatinine, Ser: 0.78 mg/dL (ref 0.57–1.00)
GFR calc Af Amer: 96 mL/min/{1.73_m2} (ref >59)
GFR calc non Af Amer: 83 mL/min/{1.73_m2} (ref >59)
Glucose: 93 mg/dL (ref 65–99)
Potassium: 4.5 mmol/L (ref 3.5–5.2)
Sodium: 142 mmol/L (ref 134–144)

## 2019-05-26 LAB — TSH: TSH: 1.02 u[IU]/mL (ref 0.450–4.500)

## 2019-05-27 ENCOUNTER — Encounter: Payer: Self-pay | Admitting: Family Medicine

## 2019-05-29 ENCOUNTER — Encounter: Payer: Self-pay | Admitting: Podiatry

## 2019-06-02 ENCOUNTER — Other Ambulatory Visit: Payer: Self-pay

## 2019-06-02 ENCOUNTER — Encounter (HOSPITAL_COMMUNITY): Payer: Self-pay | Admitting: Emergency Medicine

## 2019-06-02 ENCOUNTER — Ambulatory Visit (HOSPITAL_COMMUNITY)
Admission: EM | Admit: 2019-06-02 | Discharge: 2019-06-02 | Disposition: A | Discharge status: Home / Self Care | Payer: BC Managed Care – PPO | Attending: Family Medicine | Admitting: Family Medicine

## 2019-06-02 DIAGNOSIS — R300 Dysuria: Secondary | ICD-10-CM | POA: Diagnosis not present

## 2019-06-02 LAB — POCT URINALYSIS DIP (DEVICE)
Bilirubin Urine: NEGATIVE
Glucose, UA: NEGATIVE mg/dL
Hgb urine dipstick: NEGATIVE
Ketones, ur: NEGATIVE mg/dL
Nitrite: NEGATIVE
Protein, ur: NEGATIVE mg/dL
Specific Gravity, Urine: 1.02 (ref 1.005–1.030)
Urobilinogen, UA: 0.2 mg/dL (ref 0.0–1.0)
pH: 6.5 (ref 5.0–8.0)

## 2019-06-02 NOTE — ED Triage Notes (Signed)
Pt here for dysuria and not feeling well

## 2019-06-02 NOTE — ED Provider Notes (Signed)
[facility]    CSN: [id number] Arrival date & time: 06/02/19  1229      History   Chief Complaint Chief Complaint  Patient presents with  . Dysuria    HPI [name] is a 58 y.o. female.   She is presenting with dysuria.  This is ongoing for 3 days.  She has taken some amoxicillin that she had at home.  She is noticing some urine odor changes as well as discharge.  Denies any chance of sexually transmitted infection.  Feels like the odor has improved since taking the antibiotics.  Noticed some hematuria as well.  HPI  Past Medical History:  Diagnosis Date  . Anemia   . Anxiety   . Colon polyp 08/28/2012   Tubular adenoma  . Dense breast   . Depression   . Genital warts   . GERD (gastroesophageal reflux disease)   . Heart murmur   . Hemorrhoids   . Hx of cardiac catheterization 2009   normal coronary arteries  . Hx of menorrhagia    Novasure  . Hypokalemia   . Hypomagnesemia   . Hypophosphatemia   . Lactose intolerance in adult   . Mitral valve prolapse   . Normal cardiac stress test 03/09/2012   no ischemia  . Positive PPD   . Rapid heart beat    States "irreg heart beat"  . Thyroid disease    Thyroid nodules    Patient Active Problem List   Diagnosis Date Noted  . Chest heaviness 11/28/2018  . Atypical chest pain 11/28/2018  . Amenorrhea 10/31/2018  . Genital herpes simplex 10/31/2018  . Lesion of vulva 10/31/2018  . Menopausal flushing 10/31/2018  . Stress incontinence of urine 10/31/2018  . Cough 08/16/2018  . Fever and chills 08/16/2018  . Lower respiratory infection 08/16/2018  . History of diverticulosis 05/04/2017  . Hypercalcemia 07/11/2013  . Hypocalcemia 07/05/2013  . Palpitations 07/05/2013  . Pes planus (flat feet) 03/29/2013  . History of PSVT (paroxysmal supraventricular tachycardia) 06/24/2012  . Hypokalemia 06/23/2012  . Hx of cardiac catheterization   . Positive PPD   . Multinodular goiter 05/12/2012  .  SLEEP APNEA 01/19/2008  . Gastroesophageal reflux disease 01/18/2008  . DYSPEPSIA 01/18/2008  . IRRITABLE BOWEL SYNDROME 01/18/2008    Past Surgical History:  Procedure Laterality Date  . CARDIAC CATHETERIZATION     x2 with normal results per pt  . NOVASURE ABLATION    . UPPER GASTROINTESTINAL ENDOSCOPY  May 2013    OB History   No obstetric history on file.      Home Medications    Prior to Admission medications   Medication Sig Start Date End Date Taking? Authorizing Provider  CALCIUM PO Take 2 tablets by mouth daily.    [provider]  cholecalciferol (VITAMIN D) 1000 UNITS tablet Take 2,000 Units by mouth daily.     [provider]  cyclobenzaprine (FLEXERIL) 10 MG tablet Take 1 tablet (10 mg total) by mouth 3 (three) times daily as needed for muscle spasms. 05/25/19   Wendie Agreste, MD  losartan (COZAAR) 25 MG tablet Take 25 mg by mouth daily.    [provider]  metoprolol tartrate (LOPRESSOR) 25 MG tablet Take 25 mg by mouth 2 (two) times daily. 12/29/15   [provider]  PARoxetine HCl (PAXIL PO) Take 25 mg by mouth daily. Prescribed by mental health provider. Could not tolerate paxil CR 37.5mg .     [provider]  potassium chloride SA (K-DUR) 20 MEQ tablet Take 2 tablets (40 mEq total) by mouth daily. 05/25/19   Wendie Agreste, MD    Family History Family History  Problem Relation Age of Onset  . Heart disease Father        died Mi 34  . Hypertension Maternal Grandfather   . Diabetes Maternal Grandfather   . Heart disease Maternal Grandfather   . Colon cancer Maternal Grandfather   . Rectal cancer Maternal Grandfather   . Diabetes Maternal Grandmother   . Prostate cancer Neg Hx   . Breast cancer Neg Hx     Social History Social History   Tobacco Use  . Smoking status: Never Smoker  . Smokeless tobacco: Never Used  Substance Use Topics  . Alcohol use: No  . Drug use: No     Allergies    Ceftriaxone sodium and Doxycycline   Review of Systems Review of Systems  Constitutional: Negative for fever.  HENT: Negative for congestion.   Respiratory: Negative for cough.   Cardiovascular: Negative for chest pain.  Gastrointestinal: Negative for abdominal pain.  Genitourinary: Positive for dysuria.  Musculoskeletal: Negative for back pain.  Skin: Negative for color change.  Neurological: Negative for weakness.  Hematological: Negative for adenopathy.     Physical Exam Triage Vital Signs ED Triage Vitals [06/02/19 1250]  Enc Vitals Group     BP 119/74     Pulse Rate 70     Resp 18     Temp 98.3 F (36.8 C)     Temp Source Oral     SpO2 100 %     Weight      Height      Head Circumference      Peak Flow      Pain Score 3     Pain Loc      Pain Edu?      Excl. in [place]?    No data found.  Updated Vital Signs BP 119/74 (BP Location: Right Arm)   Pulse 70   Temp 98.3 F (36.8 C) (Oral)   Resp 18   LMP 09/18/2016 (Exact Date)   SpO2 100%   Visual Acuity Right Eye Distance:   Left Eye Distance:   Bilateral Distance:    Right Eye Near:   Left Eye Near:    Bilateral Near:     Physical Exam Gen: NAD, alert, cooperative with exam, well-appearing ENT: normal lips, normal nasal mucosa,  Eye: normal EOM, normal conjunctiva and lids CV:  no edema, +2 pedal pulses   Resp: no accessory muscle use, non-labored,  GI: no masses, no suprapubic tenderness, no hernia, soft, NTND, +BS  Skin: no rashes, no areas of induration  Neuro: normal tone, normal sensation to touch Psych:  normal insight, alert and oriented MSK: normal gait, normal strength   UC Treatments / Results  Labs (all labs ordered are listed, but only abnormal results are displayed) Labs Reviewed  POCT URINALYSIS DIP (DEVICE) - Abnormal; Notable for the following components:      Result Value   Leukocytes,Ua SMALL (*)    All other components within normal limits  URINE CULTURE  CERVICOVAGINAL  ANCILLARY ONLY    EKG   Radiology No results found.  Procedures Procedures (including critical care time)  Medications Ordered in UC Medications - No data to display  Initial Impression / Assessment and Plan / UC Course  I have reviewed the triage vital signs and the nursing notes.  Pertinent labs & imaging results that were available during my care of the patient were reviewed by me and considered in my medical decision making (see chart for details).     Ren is a 58 year old female is presenting with dysuria.  Dipstick did not show significant infection.  Will send for urine culture.  Cervical cytology swab was obtained.  Counseled to stop take antibiotics at the current time.  Counseled on supportive care.  Given indications to follow-up and return.  Final Clinical Impressions(s) / UC Diagnoses   Final diagnoses:  Dysuria     Discharge Instructions     Please do not take any further antibiotics  We will call you with the results from today  Please follow up if your symptoms fail to improve.     ED Prescriptions    None     PDMP not reviewed this encounter.   Rosemarie Ax, MD 06/02/19 [phone]

## 2019-06-02 NOTE — Discharge Instructions (Signed)
Please do not take any further antibiotics  We will call you with the results from today  Please follow up if your symptoms fail to improve.

## 2019-06-02 NOTE — ED Notes (Signed)
Cytology Order was discontinued after I clicked off on order. Order was changed to Cervicovaginal ancillary.

## 2019-06-03 ENCOUNTER — Other Ambulatory Visit: Payer: Self-pay | Admitting: General Practice

## 2019-06-03 LAB — URINE CULTURE: Culture: NO GROWTH

## 2019-06-04 LAB — CERVICOVAGINAL ANCILLARY ONLY
Bacterial Vaginitis (gardnerella): NEGATIVE
Candida Glabrata: NEGATIVE
Candida Vaginitis: NEGATIVE
Molecular Disclaimer: NEGATIVE
Molecular Disclaimer: NEGATIVE
Molecular Disclaimer: NEGATIVE
Molecular Disclaimer: NORMAL
Trichomonas: NEGATIVE

## 2019-06-05 LAB — CERVICOVAGINAL ANCILLARY ONLY
Chlamydia: NEGATIVE
Neisseria Gonorrhea: NEGATIVE

## 2019-06-21 ENCOUNTER — Other Ambulatory Visit: Payer: Self-pay

## 2019-06-21 ENCOUNTER — Ambulatory Visit: Payer: BC Managed Care – PPO | Admitting: Emergency Medicine

## 2019-06-21 DIAGNOSIS — Z20822 Contact with and (suspected) exposure to covid-19: Secondary | ICD-10-CM

## 2019-06-23 ENCOUNTER — Emergency Department (HOSPITAL_COMMUNITY)
Admission: EM | Admit: 2019-06-23 | Discharge: 2019-06-23 | Disposition: A | Discharge status: Home / Self Care | Payer: BC Managed Care – PPO | Attending: Emergency Medicine | Admitting: Emergency Medicine

## 2019-06-23 ENCOUNTER — Encounter (HOSPITAL_COMMUNITY): Payer: Self-pay | Admitting: Emergency Medicine

## 2019-06-23 ENCOUNTER — Other Ambulatory Visit: Payer: Self-pay

## 2019-06-23 ENCOUNTER — Emergency Department (HOSPITAL_COMMUNITY): Payer: BC Managed Care – PPO

## 2019-06-23 DIAGNOSIS — R002 Palpitations: Secondary | ICD-10-CM

## 2019-06-23 DIAGNOSIS — Z79899 Other long term (current) drug therapy: Secondary | ICD-10-CM | POA: Diagnosis not present

## 2019-06-23 DIAGNOSIS — Z20828 Contact with and (suspected) exposure to other viral communicable diseases: Secondary | ICD-10-CM | POA: Insufficient documentation

## 2019-06-23 DIAGNOSIS — R079 Chest pain, unspecified: Secondary | ICD-10-CM | POA: Diagnosis not present

## 2019-06-23 DIAGNOSIS — E876 Hypokalemia: Secondary | ICD-10-CM

## 2019-06-23 DIAGNOSIS — Z20822 Contact with and (suspected) exposure to covid-19: Secondary | ICD-10-CM

## 2019-06-23 LAB — BASIC METABOLIC PANEL
Anion gap: 11 (ref 5–15)
BUN: 10 mg/dL (ref 6–20)
CO2: 26 mmol/L (ref 22–32)
Calcium: 9.2 mg/dL (ref 8.9–10.3)
Chloride: 107 mmol/L (ref 98–111)
Creatinine, Ser: 0.93 mg/dL (ref 0.44–1.00)
GFR calc Af Amer: >60 mL/min (ref >60)
GFR calc non Af Amer: >60 mL/min (ref >60)
Glucose, Bld: 136 mg/dL — ABNORMAL HIGH (ref 70–99)
Potassium: 3.5 mmol/L (ref 3.5–5.1)
Sodium: 144 mmol/L (ref 135–145)

## 2019-06-23 LAB — CBC
HCT: 45.2 % (ref 36.0–46.0)
Hemoglobin: 13.5 g/dL (ref 12.0–15.0)
MCH: 23.5 pg — ABNORMAL LOW (ref 26.0–34.0)
MCHC: 29.9 g/dL — ABNORMAL LOW (ref 30.0–36.0)
MCV: 78.6 fL — ABNORMAL LOW (ref 80.0–100.0)
Platelets: 243 10*3/uL (ref 150–400)
RBC: 5.75 MIL/uL — ABNORMAL HIGH (ref 3.87–5.11)
RDW: 14.3 % (ref 11.5–15.5)
WBC: 9.1 10*3/uL (ref 4.0–10.5)
nRBC: 0 % (ref 0.0–0.2)

## 2019-06-23 LAB — TROPONIN I (HIGH SENSITIVITY)
Troponin I (High Sensitivity): 7 ng/L (ref <18)
Troponin I (High Sensitivity): 10 ng/L (ref <18)

## 2019-06-23 LAB — NOVEL CORONAVIRUS, NAA: SARS-CoV-2, NAA: NOT DETECTED

## 2019-06-23 LAB — MAGNESIUM: Magnesium: 1.9 mg/dL (ref 1.7–2.4)

## 2019-06-23 MED ORDER — DEXAMETHASONE 4 MG PO TABS
10.0000 mg | ORAL_TABLET | Freq: Once | ORAL | Status: AC
  Administered 2019-06-23: 10 mg via ORAL
  Filled 2019-06-23: qty 2

## 2019-06-23 MED ORDER — POTASSIUM CHLORIDE CRYS ER 20 MEQ PO TBCR: 20.0000 meq | EXTENDED_RELEASE_TABLET | Freq: Three times a day (TID) | ORAL | 0 refills | Status: DC

## 2019-06-23 MED ORDER — POTASSIUM CHLORIDE CRYS ER 20 MEQ PO TBCR
40.0000 meq | EXTENDED_RELEASE_TABLET | Freq: Once | ORAL | Status: AC
  Administered 2019-06-23: 05:00:00 40 meq via ORAL
  Filled 2019-06-23: qty 2

## 2019-06-23 MED ORDER — SODIUM CHLORIDE 0.9% FLUSH
3.0000 mL | Freq: Once | INTRAVENOUS | Status: AC
  Administered 2019-06-23: 3 mL via INTRAVENOUS

## 2019-06-23 MED ORDER — DEXAMETHASONE 6 MG PO TABS: 6.0000 mg | ORAL_TABLET | Freq: Every day | ORAL | 0 refills | Status: DC

## 2019-06-23 NOTE — Discharge Instructions (Signed)
Increase your potassium to 3 pills a day.  Even though you got a dose of the potassium in the emergency department this morning, take an additional 3 tablets today.  If you should develop difficulty breathing or a high fever, then return to the emergency department for further evaluation.

## 2019-06-23 NOTE — ED Notes (Signed)
Pt was verbalized discharge instructions. Pt had no further questions at this time. NAD. 

## 2019-06-23 NOTE — ED Triage Notes (Signed)
Patient complaining of mid chest pain and palpitation. Patient states this started yesterday. Patient states that she is on medication for this and it is not working Midwife.

## 2019-06-23 NOTE — ED Provider Notes (Signed)
[facility] DEPT Provider Note   CSN: [id number] Arrival date & time: 06/23/19  0123    History   Chief Complaint Chief Complaint  Patient presents with   Palpitations   Chest Pain    HPI [name] is a 58 y.o. female.   The history is provided by the patient.  Palpitations Associated symptoms: chest pain   Chest Pain Associated symptoms: palpitations   She has history of palpitations, hypertension, chest pain with normal coronary arteries, hypokalemia and comes in because she is very anxious about possible coronavirus.  She states that her grandchild, son, daughter-in-law have all tested positive for coronavirus.  She has had a cold for the last week with cough productive of some clear sputum.  She denies fever, chills, sweats.  She denies shortness of breath.  She thinks that her sense of smell has gotten a little less acute today.  She did have a coronavirus test done yesterday at an urgent care center, but the results will not be back for 1-2 days.  Tonight, she felt increased anxiety and noted some palpitations.  She felt like her heart was skipping beats and felt like her blood pressure was going up.  Past Medical History:  Diagnosis Date   Anemia    Anxiety    Colon polyp 08/28/2012   Tubular adenoma   Dense breast    Depression    Genital warts    GERD (gastroesophageal reflux disease)    Heart murmur    Hemorrhoids    Hx of cardiac catheterization 2009   normal coronary arteries   Hx of menorrhagia    Novasure   Hypokalemia    Hypomagnesemia    Hypophosphatemia    Lactose intolerance in adult    Mitral valve prolapse    Normal cardiac stress test 03/09/2012   no ischemia   Positive PPD    Rapid heart beat    States "irreg heart beat"   Thyroid disease    Thyroid nodules    Patient Active Problem List   Diagnosis Date Noted   Chest heaviness 11/28/2018   Atypical chest pain  11/28/2018   Amenorrhea 10/31/2018   Genital herpes simplex 10/31/2018   Lesion of vulva 10/31/2018   Menopausal flushing 10/31/2018   Stress incontinence of urine 10/31/2018   Cough 08/16/2018   Fever and chills 08/16/2018   Lower respiratory infection 08/16/2018   History of diverticulosis 05/04/2017   Hypercalcemia 07/11/2013   Hypocalcemia 07/05/2013   Palpitations 07/05/2013   Pes planus (flat feet) 03/29/2013   History of PSVT (paroxysmal supraventricular tachycardia) 06/24/2012   Hypokalemia 06/23/2012   Hx of cardiac catheterization    Positive PPD    Multinodular goiter 05/12/2012   SLEEP APNEA 01/19/2008   Gastroesophageal reflux disease 01/18/2008   DYSPEPSIA 01/18/2008   IRRITABLE BOWEL SYNDROME 01/18/2008    Past Surgical History:  Procedure Laterality Date   CARDIAC CATHETERIZATION     x2 with normal results per pt   [facility]  May 2013     OB History   No obstetric history on file.      Home Medications    Prior to Admission medications   Medication Sig Start Date End Date Taking? Authorizing Provider  CALCIUM PO Take 2 tablets by mouth daily.   Yes [provider]  cholecalciferol (VITAMIN D) 1000 UNITS tablet Take 2,000 Units by mouth daily.    Yes [provider]  losartan (COZAAR) 50 MG tablet Take 50 mg by mouth daily.   Yes [provider]  metoprolol succinate (TOPROL-XL) 25 MG 24 hr tablet Take 25 mg by mouth daily.   Yes [provider]  PARoxetine (PAXIL CR) 25 MG 24 hr tablet Take 25 mg by mouth daily.   Yes [provider]  potassium chloride SA (K-DUR) 20 MEQ tablet Take 2 tablets (40 mEq total) by mouth daily. 05/25/19  Yes Wendie Agreste, MD  cyclobenzaprine (FLEXERIL) 10 MG tablet Take 1 tablet (10 mg total) by mouth 3 (three) times daily as needed for muscle spasms. Patient not taking: Reported on 06/23/2019 05/25/19    Wendie Agreste, MD    Family History Family History  Problem Relation Age of Onset   Heart disease Father        died Mi 96   Hypertension Maternal Grandfather    Diabetes Maternal Grandfather    Heart disease Maternal Grandfather    Colon cancer Maternal Grandfather    Rectal cancer Maternal Grandfather    Diabetes Maternal Grandmother    Prostate cancer Neg Hx    Breast cancer Neg Hx     Social History Social History   Tobacco Use   Smoking status: Never Smoker   Smokeless tobacco: Never Used  Substance Use Topics   Alcohol use: No   Drug use: No     Allergies   Ceftriaxone sodium and Doxycycline   Review of Systems Review of Systems  Cardiovascular: Positive for chest pain and palpitations.  All other systems reviewed and are negative.    Physical Exam Updated Vital Signs BP (!) 145/98 (BP Location: Left Arm)    Pulse 94    Temp 98.3 F (36.8 C) (Oral)    Resp 16    Ht 5\' 6"  (1.676 m)    Wt 90.7 kg    LMP 09/18/2016 (Exact Date)    SpO2 99%    BMI 32.28 kg/m   Physical Exam Vitals signs and nursing note reviewed.    58 year old female, resting comfortably and in no acute distress. Vital signs are significant for elevated blood pressure. Oxygen saturation is 99%, which is normal. Head is normocephalic and atraumatic. PERRLA, EOMI. Oropharynx is clear. Neck is nontender and supple without adenopathy or JVD. Back is nontender and there is no CVA tenderness. Lungs are clear without rales, wheezes, or rhonchi. Chest is nontender. Heart has regular rate and rhythm without murmur. Abdomen is soft, flat, nontender without masses or hepatosplenomegaly and peristalsis is normoactive. Extremities have no cyanosis or edema, full range of motion is present. Skin is warm and dry without rash. Neurologic: Mental status is normal, cranial nerves are intact, there are no motor or sensory deficits.  ED Treatments / Results  Labs (all labs ordered are  listed, but only abnormal results are displayed) Labs Reviewed  BASIC METABOLIC PANEL - Abnormal; Notable for the following components:      Result Value   Glucose, Bld 136 (*)    All other components within normal limits  CBC - Abnormal; Notable for the following components:   RBC 5.75 (*)    MCV 78.6 (*)    MCH 23.5 (*)    MCHC 29.9 (*)    All other components within normal limits  MAGNESIUM  TROPONIN I (HIGH SENSITIVITY)  TROPONIN I (HIGH SENSITIVITY)    EKG EKG Interpretation  Date/Time:  Saturday June 23 2019 01:34:03 EDT Ventricular Rate:  93 PR Interval:    QRS Duration: 80 QT Interval:  344 QTC Calculation: 428 R Axis:   25 Text Interpretation:  Sinus rhythm Borderline repolarization abnormality When compared with ECG of 11/30/2018, HEART RATE has decreased Confirmed by Delora Fuel ([id number]) on 06/23/2019 1:40:35 AM   Radiology Dg Chest 2 View  Result Date: 06/23/2019 CLINICAL DATA:  Chest pain EXAM: CHEST - 2 VIEW COMPARISON:  November 30, 2018 FINDINGS: The heart size and mediastinal contours are within normal limits. Both lungs are clear. The visualized skeletal structures are unremarkable. IMPRESSION: No active cardiopulmonary disease. Electronically Signed   By: Constance Holster M.D.   On: 06/23/2019 01:58    Procedures Procedures   Medications Ordered in ED Medications  sodium chloride flush (NS) 0.9 % injection 3 mL (has no administration in time range)  potassium chloride SA (KLOR-CON) CR tablet 40 mEq (has no administration in time range)  dexamethasone (DECADRON) tablet 10 mg (has no administration in time range)     Initial Impression / Assessment and Plan / ED Course  I have reviewed the triage vital signs and the nursing notes.  Pertinent labs & imaging results that were available during my care of the patient were reviewed by me and considered in my medical decision making (see chart for details).  Exposure to COVID-19 with symptoms  compatible with mild disease.  Chest x-ray shows no evidence of pneumonia or groundglass densities.  CBC is normal.  Electrolytes are pending.  Old records are reviewed, and she has had several ED visits for benign palpitations.  Her cardiologist noted normal cardiac catheterization in 2007.  Will check magnesium along with potassium.  Anticipate sending the patient home on a short course of dexamethasone  Magnesium is normal at 1.9.  Potassium is normal, but at the lower end of normal.  Given that she likely has symptomatic PVCs, I feel that her potassium should be maintained over 4.  She is given a dose of K-Dur in the emergency department.  She is currently taking K. Dur 40 mEq a day.  She is given a new prescription to increase her home dose to 60 mEq a day.  For presumed COVID-19, she is given a dose of dexamethasone and prescription for an additional 4 days of dexamethasone.  Return precautions discussed.  Delonda Rotha Laur was evaluated in Emergency Department on 06/23/2019 for the symptoms described in the history of present illness. She was evaluated in the context of the global COVID-19 pandemic, which necessitated consideration that the patient might be at risk for infection with the SARS-CoV-2 virus that causes COVID-19. Institutional protocols and algorithms that pertain to the evaluation of patients at risk for COVID-19 are in a state of rapid change based on information released by regulatory bodies including the CDC and federal and state organizations. These policies and algorithms were followed during the patient's care in the ED.  Final Clinical Impressions(s) / ED Diagnoses   Final diagnoses:  Exposure to COVID-19 virus  Palpitations    ED Discharge Orders         Ordered    potassium chloride SA (KLOR-CON) 20 MEQ tablet  3 times daily     06/23/19 0454    dexamethasone (DECADRON) 6 MG tablet  Daily     06/23/19 [id number]           Delora Fuel, MD [id number]

## 2019-06-26 ENCOUNTER — Other Ambulatory Visit: Payer: Self-pay

## 2019-06-26 DIAGNOSIS — Z20822 Contact with and (suspected) exposure to covid-19: Secondary | ICD-10-CM

## 2019-06-26 DIAGNOSIS — Z20828 Contact with and (suspected) exposure to other viral communicable diseases: Secondary | ICD-10-CM | POA: Diagnosis not present

## 2019-06-28 LAB — NOVEL CORONAVIRUS, NAA: SARS-CoV-2, NAA: NOT DETECTED

## 2019-07-18 ENCOUNTER — Other Ambulatory Visit: Payer: Self-pay

## 2019-07-18 DIAGNOSIS — Z20822 Contact with and (suspected) exposure to covid-19: Secondary | ICD-10-CM

## 2019-07-19 LAB — NOVEL CORONAVIRUS, NAA: SARS-CoV-2, NAA: NOT DETECTED

## 2019-08-15 ENCOUNTER — Other Ambulatory Visit: Payer: Self-pay

## 2019-08-15 ENCOUNTER — Ambulatory Visit (INDEPENDENT_AMBULATORY_CARE_PROVIDER_SITE_OTHER): Payer: BC Managed Care – PPO | Admitting: Emergency Medicine

## 2019-08-15 ENCOUNTER — Encounter: Payer: Self-pay | Admitting: Emergency Medicine

## 2019-08-15 VITALS — BP 109/67 | HR 59 | Temp 98.1°F | Resp 16 | Ht 66.0 in | Wt 202.2 lb

## 2019-08-15 DIAGNOSIS — M940 Chondrocostal junction syndrome [Tietze]: Secondary | ICD-10-CM

## 2019-08-15 MED ORDER — MELOXICAM 7.5 MG PO TABS: 7.5000 mg | ORAL_TABLET | Freq: Every day | ORAL | 1 refills | Status: DC

## 2019-08-15 NOTE — Progress Notes (Signed)
[name] 58 y.o.   Chief Complaint  Patient presents with  . Chest Pain    x 4 months with movement of the arms  . Joint Swelling    both wrist x 1 month    HISTORY OF PRESENT ILLNESS: This is a 58 y.o. female complaining of almost constant pain across her chest for the past 4 months worse with movement or deep breathing.  Most likely secondary to work's physical requirements.  No other associated symptoms.  Also occasionally gets pains to both wrists and hands related to workload. No other complaints or medical concerns today.  HPI   Prior to Admission medications   Medication Sig Start Date End Date Taking? Authorizing Provider  CALCIUM PO Take 2 tablets by mouth daily.   Yes [provider]  losartan (COZAAR) 50 MG tablet Take 50 mg by mouth daily.   Yes [provider]  metoprolol succinate (TOPROL-XL) 25 MG 24 hr tablet Take 25 mg by mouth daily.   Yes [provider]  PARoxetine (PAXIL CR) 25 MG 24 hr tablet Take 25 mg by mouth daily.   Yes [provider]  potassium chloride SA (KLOR-CON) 20 MEQ tablet Take 1 tablet (20 mEq total) by mouth 3 (three) times daily. [id number]  Yes Delora Fuel, MD  TURMERIC PO Take by mouth.   Yes [provider]  cholecalciferol (VITAMIN D) 1000 UNITS tablet Take 2,000 Units by mouth daily.     [provider]  dexamethasone (DECADRON) 6 MG tablet Take 1 tablet (6 mg total) by mouth daily. Patient not taking: Reported on [id number]   Delora Fuel, MD    Allergies  Allergen Reactions  . Ceftriaxone Sodium Itching  . Doxycycline Rash    Patient Active Problem List   Diagnosis Date Noted  . Stress incontinence of urine 10/31/2018  . History of diverticulosis 05/04/2017  . History of PSVT (paroxysmal supraventricular tachycardia) 06/24/2012  . Hx of cardiac catheterization   . Multinodular goiter 05/12/2012  . SLEEP APNEA 01/19/2008  . IRRITABLE BOWEL SYNDROME  01/18/2008    Past Medical History:  Diagnosis Date  . Anemia   . Anxiety   . Colon polyp 08/28/2012   Tubular adenoma  . Dense breast   . Depression   . Genital warts   . GERD (gastroesophageal reflux disease)   . Heart murmur   . Hemorrhoids   . Hx of cardiac catheterization 2009   normal coronary arteries  . Hx of menorrhagia    Novasure  . Hypokalemia   . Hypomagnesemia   . Hypophosphatemia   . Lactose intolerance in adult   . Mitral valve prolapse   . Normal cardiac stress test 03/09/2012   no ischemia  . Positive PPD   . Rapid heart beat    States "irreg heart beat"  . Thyroid disease    Thyroid nodules    Past Surgical History:  Procedure Laterality Date  . CARDIAC CATHETERIZATION     x2 with normal results per pt  . NOVASURE ABLATION    . UPPER GASTROINTESTINAL ENDOSCOPY  May 2013    Social History   Socioeconomic History  . Marital status: Divorced    Spouse name: Not on file  . Number of children: 2  . Years of education: 53  . Highest education level: Not on file  Occupational History  . Occupation: dialysis Engineer, production: [employer]  Social Needs  . Financial resource strain:  Not on file  . Food insecurity    Worry: Not on file    Inability: Not on file  . Transportation needs    Medical: Not on file    Non-medical: Not on file  Tobacco Use  . Smoking status: Never Smoker  . Smokeless tobacco: Never Used  Substance and Sexual Activity  . Alcohol use: No  . Drug use: No  . Sexual activity: Yes  Lifestyle  . Physical activity    Days per week: Not on file    Minutes per session: Not on file  . Stress: Not on file  Relationships  . Social Herbalist on phone: Not on file    Gets together: Not on file    Attends religious service: Not on file    Active member of club or organization: Not on file    Attends meetings of clubs or organizations: Not on file    Relationship status: Not on file  . Intimate  partner violence    Fear of current or ex partner: Not on file    Emotionally abused: Not on file    Physically abused: Not on file    Forced sexual activity: Not on file  Other Topics Concern  . Not on file  Social History Narrative   Ms. 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 She lived in [place] and in [place] for a number of years her mom is from [place]. 16 years of education went to college at [employer] lives at home with her son who is in his 32s no pets   Neg ets tob etoh hx PA   6 hours of sleep   G2P2    TD2010  colonoscopy 2009   Now running  own business    HH of 2    Left handed    Caffeine use: tea sometimes          Family History  Problem Relation Age of Onset  . Heart disease Father        died Mi 45  . Hypertension Maternal Grandfather   . Diabetes Maternal Grandfather   . Heart disease Maternal Grandfather   . Colon cancer Maternal Grandfather   . Rectal cancer Maternal Grandfather   . Diabetes Maternal Grandmother   . Prostate cancer Neg Hx   . Breast cancer Neg Hx      Review of Systems  Constitutional: Negative.  Negative for chills and fever.  HENT: Negative.  Negative for congestion and sore throat.   Eyes: Negative.   Respiratory: Negative.  Negative for cough and shortness of breath.   Cardiovascular: Positive for chest pain. Negative for palpitations and leg swelling.  Gastrointestinal: Negative.  Negative for abdominal pain, diarrhea, nausea and vomiting.  Genitourinary: Negative.   Musculoskeletal: Positive for joint pain.  Skin: Negative.  Negative for rash.  Neurological: Negative.  Negative for dizziness and headaches.  All other systems reviewed and are negative.  Vitals:   08/15/19 1139  BP: 109/67  Pulse: (!) 59  Resp: 16  Temp: 98.1 F (36.7 C)  SpO2: 97%     Physical Exam Vitals signs reviewed.   Constitutional:      Appearance: She is well-developed.  HENT:     Head: Normocephalic.  Eyes:     Extraocular Movements: Extraocular movements intact.     Conjunctiva/sclera: Conjunctivae normal.  Pupils: Pupils are equal, round, and reactive to light.  Neck:     Musculoskeletal: Normal range of motion and neck supple.  Cardiovascular:     Rate and Rhythm: Normal rate and regular rhythm.     Pulses: Normal pulses.     Heart sounds: Normal heart sounds.  Pulmonary:     Effort: Pulmonary effort is normal.     Breath sounds: Normal breath sounds.  Chest:     Chest wall: Tenderness present.  Abdominal:     Palpations: Abdomen is soft.     Tenderness: There is no abdominal tenderness.  Musculoskeletal: Normal range of motion.  Skin:    General: Skin is warm and dry.     Capillary Refill: Capillary refill takes less than 2 seconds.  Neurological:     General: No focal deficit present.     Mental Status: She is alert and oriented to person, place, and time.  Psychiatric:        Mood and Affect: Mood normal.        Behavior: Behavior normal.    A total of 25 minutes was spent in the room with the patient, greater than 50% of which was in counseling/coordination of care regarding differential diagnosis, review of previous records including most recent blood work and EKGs, cardiology evaluations, management and treatment, medication side effects, prognosis, and need for follow-up.   ASSESSMENT & PLAN: Dalesha was seen today for chest pain and joint swelling.  Diagnoses and all orders for this visit:  Acute costochondritis -     meloxicam (MOBIC) 7.5 MG tablet; Take 1 tablet (7.5 mg total) by mouth daily for 10 days.    Patient Instructions       If you have lab work done today you will be contacted with your lab results within the next 2 weeks.  If you have not heard from [place] then please contact us. The fastest way to get your results is to register for My Chart.    IF you received an x-ray today, you will receive an invoice from [facility]. Please contact [facility] at [phone] with questions or concerns regarding your invoice.   IF you received labwork today, you will receive an invoice from [facility]. Please contact [facility] at [phone] with questions or concerns regarding your invoice.   Our billing staff will not be able to assist you with questions regarding bills from these companies.  You will be contacted with the lab results as soon as they are available. The fastest way to get your results is to activate your My Chart account. Instructions are located on the last page of this paperwork. If you have not heard from [place] regarding the results in 2 weeks, please contact this office.     Costochondritis Costochondritis is swelling and irritation (inflammation) of the tissue (cartilage) that connects your ribs to your breastbone (sternum). This causes pain in the front of your chest. Usually, the pain:  Starts gradually.  Is in more than one rib. This condition usually goes away on its own over time. Follow these instructions at home:  Do not do anything that makes your pain worse.  If directed, put ice on the painful area: ? Put ice in a plastic bag. ? Place a towel between your skin and the bag. ? Leave the ice on for 20 minutes, 2-3 times a day.  If directed, put heat on the affected area as often as told by your doctor. Use the heat source  that your doctor tells you to use, such as a moist heat pack or a heating pad. ? Place a towel between your skin and the heat source. ? Leave the heat on for 20-30 minutes. ? Take off the heat if your skin turns bright red. This is very important if you cannot feel pain, heat, or cold. You may have a greater risk of getting burned.  Take over-the-counter and prescription medicines only as told by your doctor.  Return to your normal activities as told by your doctor. Ask your  doctor what activities are safe for you.  Keep all follow-up visits as told by your doctor. This is important. Contact a doctor if:  You have chills or a fever.  Your pain does not go away or it gets worse.  You have a cough that does not go away. Get help right away if:  You are short of breath. This information is not intended to replace advice given to you by your health care provider. Make sure you discuss any questions you have with your health care provider. Document Released: 02/16/2008 Document Revised: 09/14/2017 Document Reviewed: 12/24/2015 Elsevier Patient Education  2020 Elsevier Inc.      Agustina Caroli, MD [facility]

## 2019-08-15 NOTE — Patient Instructions (Addendum)
     If you have lab work done today you will be contacted with your lab results within the next 2 weeks.  If you have not heard from [place] then please contact us. The fastest way to get your results is to register for My Chart.   IF you received an x-ray today, you will receive an invoice from [facility]. Please contact [facility] at [phone] with questions or concerns regarding your invoice.   IF you received labwork today, you will receive an invoice from [facility]. Please contact [facility] at [phone] with questions or concerns regarding your invoice.   Our billing staff will not be able to assist you with questions regarding bills from these companies.  You will be contacted with the lab results as soon as they are available. The fastest way to get your results is to activate your My Chart account. Instructions are located on the last page of this paperwork. If you have not heard from [place] regarding the results in 2 weeks, please contact this office.     Costochondritis Costochondritis is swelling and irritation (inflammation) of the tissue (cartilage) that connects your ribs to your breastbone (sternum). This causes pain in the front of your chest. Usually, the pain:  Starts gradually.  Is in more than one rib. This condition usually goes away on its own over time. Follow these instructions at home:  Do not do anything that makes your pain worse.  If directed, put ice on the painful area: ? Put ice in a plastic bag. ? Place a towel between your skin and the bag. ? Leave the ice on for 20 minutes, 2-3 times a day.  If directed, put heat on the affected area as often as told by your doctor. Use the heat source that your doctor tells you to use, such as a moist heat pack or a heating pad. ? Place a towel between your skin and the heat source. ? Leave the heat on for 20-30 minutes. ? Take off the heat if your skin turns bright red. This is very important if  you cannot feel pain, heat, or cold. You may have a greater risk of getting burned.  Take over-the-counter and prescription medicines only as told by your doctor.  Return to your normal activities as told by your doctor. Ask your doctor what activities are safe for you.  Keep all follow-up visits as told by your doctor. This is important. Contact a doctor if:  You have chills or a fever.  Your pain does not go away or it gets worse.  You have a cough that does not go away. Get help right away if:  You are short of breath. This information is not intended to replace advice given to you by your health care provider. Make sure you discuss any questions you have with your health care provider. Document Released: 02/16/2008 Document Revised: 09/14/2017 Document Reviewed: 12/24/2015 Elsevier Patient Education  2020 [employer].

## 2019-08-24 ENCOUNTER — Other Ambulatory Visit: Payer: Self-pay

## 2019-08-24 ENCOUNTER — Encounter: Payer: Self-pay | Admitting: Family Medicine

## 2019-08-24 ENCOUNTER — Ambulatory Visit (INDEPENDENT_AMBULATORY_CARE_PROVIDER_SITE_OTHER): Payer: BC Managed Care – PPO

## 2019-08-24 ENCOUNTER — Ambulatory Visit: Payer: BC Managed Care – PPO | Admitting: Family Medicine

## 2019-08-24 VITALS — BP 122/78 | HR 79 | Temp 98.2°F | Ht 66.0 in | Wt 198.4 lb

## 2019-08-24 DIAGNOSIS — R1030 Lower abdominal pain, unspecified: Secondary | ICD-10-CM | POA: Diagnosis not present

## 2019-08-24 DIAGNOSIS — R102 Pelvic and perineal pain: Secondary | ICD-10-CM | POA: Diagnosis not present

## 2019-08-24 DIAGNOSIS — K5904 Chronic idiopathic constipation: Secondary | ICD-10-CM

## 2019-08-24 DIAGNOSIS — E876 Hypokalemia: Secondary | ICD-10-CM

## 2019-08-24 LAB — POCT URINALYSIS DIP (MANUAL ENTRY)
Bilirubin, UA: NEGATIVE
Blood, UA: NEGATIVE
Glucose, UA: NEGATIVE mg/dL
Ketones, POC UA: NEGATIVE mg/dL
Nitrite, UA: NEGATIVE
Protein Ur, POC: NEGATIVE mg/dL
Spec Grav, UA: >=1.03 — AB (ref 1.010–1.025)
Urobilinogen, UA: 0.2 E.U./dL
pH, UA: 5.5 (ref 5.0–8.0)

## 2019-08-24 MED ORDER — POTASSIUM CHLORIDE CRYS ER 20 MEQ PO TBCR: 20.0000 meq | EXTENDED_RELEASE_TABLET | Freq: Three times a day (TID) | ORAL | 0 refills | Status: DC

## 2019-08-24 MED ORDER — POLYETHYLENE GLYCOL 3350 17 GM/SCOOP PO POWD: 17.0000 g | ORAL | 1 refills | Status: DC

## 2019-08-24 NOTE — Progress Notes (Signed)
12/11/20209:07 AM  [name] 19-Aug-1961, 58 y.o., female [id number]  Chief Complaint  Patient presents with  . Pelvic Pain     going on a week. constant since yesterday and also gass like pain.     HPI:   Patient is a 58 y.o. female  who presents today for lower abd pain for past week  She reports constant abd pain That is getting worse, sharp, gas like pain, not as acute, indigestion Has chronic constipation last BM yesterday No blood in stool Starting taking prune juice No nausea or vomiting No fever or chills No dysuria Mild bloating No vaginal bleeding or abnormal vaginal discharge Colonoscopy in 2019 H/o hypokalemia, takes KCL 38meq BID  Lab Results  Component Value Date   CREATININE 0.93 06/23/2019   BUN 10 06/23/2019   NA 144 06/23/2019   K 3.5 06/23/2019   CL 107 06/23/2019   CO2 26 06/23/2019    Depression screen PHQ 2/9 08/24/2019 08/15/2019 05/25/2019  Decreased Interest 0 0 0  Down, Depressed, Hopeless 0 0 0  PHQ - 2 Score 0 0 0  Some recent data might be hidden    Fall Risk  08/24/2019 08/15/2019 05/25/2019 11/28/2018 10/24/2018  Falls in the past year? 0 0 0 0 0  Number falls in past yr: 0 - 0 0 0  Injury with Fall? 0 - - 0 0  Follow up Falls evaluation completed Falls evaluation completed Falls evaluation completed - Falls evaluation completed     Allergies  Allergen Reactions  . Ceftriaxone Sodium Itching  . Doxycycline Rash    Prior to Admission medications   Medication Sig Start Date End Date Taking? Authorizing Provider  CALCIUM PO Take 2 tablets by mouth daily.   Yes [provider]  cholecalciferol (VITAMIN D) 1000 UNITS tablet Take 2,000 Units by mouth daily.    Yes [provider]  losartan (COZAAR) 50 MG tablet Take 50 mg by mouth daily.   Yes [provider]  metoprolol succinate (TOPROL-XL) 25 MG 24 hr tablet Take 25 mg by mouth daily.   Yes [provider]  PARoxetine (PAXIL CR)  25 MG 24 hr tablet Take 25 mg by mouth daily.   Yes [provider]  potassium chloride SA (KLOR-CON) 20 MEQ tablet Take 1 tablet (20 mEq total) by mouth 3 (three) times daily. [id number]  Yes Delora Fuel, MD  TURMERIC PO Take by mouth.   Yes [provider]    Past Medical History:  Diagnosis Date  . Anemia   . Anxiety   . Colon polyp 08/28/2012   Tubular adenoma  . Dense breast   . Depression   . Genital warts   . GERD (gastroesophageal reflux disease)   . Heart murmur   . Hemorrhoids   . Hx of cardiac catheterization 2009   normal coronary arteries  . Hx of menorrhagia    Novasure  . Hypokalemia   . Hypomagnesemia   . Hypophosphatemia   . Lactose intolerance in adult   . Mitral valve prolapse   . Normal cardiac stress test 03/09/2012   no ischemia  . Positive PPD   . Rapid heart beat    States "irreg heart beat"  . Thyroid disease    Thyroid nodules    Past Surgical History:  Procedure Laterality Date  . CARDIAC CATHETERIZATION     x2 with normal results per pt  . NOVASURE ABLATION    . UPPER GASTROINTESTINAL ENDOSCOPY  May 2013    Social History   Tobacco Use  . Smoking status: Never Smoker  . Smokeless tobacco: Never Used  Substance Use Topics  . Alcohol use: No    Family History  Problem Relation Age of Onset  . Heart disease Father        died Mi 27  . Hypertension Maternal Grandfather   . Diabetes Maternal Grandfather   . Heart disease Maternal Grandfather   . Colon cancer Maternal Grandfather   . Rectal cancer Maternal Grandfather   . Diabetes Maternal Grandmother   . Prostate cancer Neg Hx   . Breast cancer Neg Hx     ROS Per hpi  OBJECTIVE:  Today's Vitals   08/24/19 0852 08/24/19 0914  BP: (!) 142/84 122/78  Pulse: 79   Temp: 98.2 F (36.8 C)   TempSrc: Temporal   SpO2: 92%   Weight: 198 lb 6.4 oz (90 kg)   Height: 5\' 6"  (1.676 m)    Body mass index is 32.02 kg/m.   Physical Exam Vitals and nursing note  reviewed.  Constitutional:      Appearance: She is well-developed.  HENT:     Head: Normocephalic and atraumatic.     Mouth/Throat:     Pharynx: No oropharyngeal exudate.  Eyes:     General: No scleral icterus.    Conjunctiva/sclera: Conjunctivae normal.     Pupils: Pupils are equal, round, and reactive to light.  Cardiovascular:     Rate and Rhythm: Normal rate and regular rhythm.     Heart sounds: Normal heart sounds. No murmur. No friction rub. No gallop.   Pulmonary:     Effort: Pulmonary effort is normal.     Breath sounds: Normal breath sounds. No wheezing or rales.  Abdominal:     General: Bowel sounds are normal. There is no distension.     Palpations: Abdomen is soft. There is no hepatomegaly or splenomegaly.     Tenderness: There is generalized abdominal tenderness. There is no guarding or rebound.  Musculoskeletal:     Cervical back: Neck supple.  Skin:    General: Skin is warm and dry.  Neurological:     Mental Status: She is alert and oriented to person, place, and time.     Results for orders placed or performed in visit on 08/24/19 (from the past 24 hour(s))  POCT urinalysis dipstick     Status: Abnormal   Collection Time: 08/24/19  9:19 AM  Result Value Ref Range   Color, UA yellow yellow   Clarity, UA cloudy (A) clear   Glucose, UA negative negative mg/dL   Bilirubin, UA negative negative   Ketones, POC UA negative negative mg/dL   Spec Grav, UA >=1.030 (A) 1.010 - 1.025   Blood, UA negative negative   pH, UA 5.5 5.0 - 8.0   Protein Ur, POC negative negative mg/dL   Urobilinogen, UA 0.2 0.2 or 1.0 E.U./dL   Nitrite, UA Negative Negative   Leukocytes, UA Trace (A) Negative    DG Abd 2 Views  Result Date: 08/24/2019 CLINICAL DATA:  Left lower pelvic pain. EXAM: ABDOMEN - 2 VIEW COMPARISON:  07/25/2017 FINDINGS: Moderate to large amount of stool in the abdomen and pelvis. Nonobstructive bowel gas pattern. Again noted are multiple pelvic calcifications  that are suggestive for phleboliths. No evidence for free air. Lung bases are clear. Evidence for facet degenerative changes at L4-L5. IMPRESSION: 1. Normal bowel gas pattern. 2. Moderate to large stool burden  in the abdomen and pelvis. Electronically Signed   By: Markus Daft M.D.   On: 08/24/2019 09:41     ASSESSMENT and PLAN  1. Chronic idiopathic constipation Discussed supportive measures, new meds r/se/b and RTC precautions. Patient educational handout given.  2. Lower abdominal pain - POCT urinalysis dipstick - DG Abd 2 Views; Future  3. Hypokalemia - potassium chloride SA (KLOR-CON) 20 MEQ tablet; Take 1 tablet (20 mEq total) by mouth 3 (three) times daily. - Basic Metabolic Panel; Future  Other orders - polyethylene glycol powder (GLYCOLAX/MIRALAX) 17 GM/SCOOP powder; Take 17 g by mouth as directed.  Return if symptoms worsen or fail to improve.    Rutherford Guys, MD [facility], [place] [address] Ph.  [phone] Fax [phone]

## 2019-08-24 NOTE — Patient Instructions (Addendum)
  For constipation   Make sure you are drinking enough water daily. Make sure you are getting enough fiber in your diet - this will make you regular - you can eat high fiber foods or use metamucil as a supplement - it is really important to drink enough water when using fiber supplements.  If your stools are hard or are formed balls or you have to strain a stool softener will help - use colace 2-3 capsule a day  For gentle treatment of constipation Use Miralax 1-2 capfuls a day until your stools are soft and regular and then decrease the usage - you can use this daily  For more aggressive treatment of constipation Use 4 capfuls of Colace and 6 doses of Miralax and drink it in 2 hours - this should result in several watery stools - if it does not repeat the next day and then go to daily miralax for a week to make sure your bowels are clean and retrained to work properly  For the most aggressive treatment of constipation Use 14 capfuls of Miralax in 1 gallon of fluid (gatoraid or water work well or a combination of the two) and drink over 12h - it is ok to eat during this time and then use Miralax 1 capful daily for about 2 weeks to prevent the constipation from returning   If you have lab work done today you will be contacted with your lab results within the next 2 weeks.  If you have not heard from us then please contact us. The fastest way to get your results is to register for My Chart.   IF you received an x-ray today, you will receive an invoice from [facility]. Please contact [facility] at [phone] with questions or concerns regarding your invoice.   IF you received labwork today, you will receive an invoice from [facility]. Please contact [facility] at [phone] with questions or concerns regarding your invoice.   Our billing staff will not be able to assist you with questions regarding bills from these companies.  You will be contacted with the lab results as  soon as they are available. The fastest way to get your results is to activate your My Chart account. Instructions are located on the last page of this paperwork. If you have not heard from us regarding the results in 2 weeks, please contact this office.     

## 2019-08-31 ENCOUNTER — Ambulatory Visit: Payer: BC Managed Care – PPO

## 2019-08-31 ENCOUNTER — Other Ambulatory Visit: Payer: Self-pay

## 2019-08-31 ENCOUNTER — Telehealth: Payer: Self-pay

## 2019-08-31 DIAGNOSIS — E876 Hypokalemia: Secondary | ICD-10-CM | POA: Diagnosis not present

## 2019-08-31 NOTE — Telephone Encounter (Signed)
Pt would like to know if vitamin D could be added to her lab work due to her hair falling out.

## 2019-09-01 LAB — BASIC METABOLIC PANEL
BUN/Creatinine Ratio: 10 — ABNORMAL LOW (ref 12–28)
BUN: 9 mg/dL (ref 8–27)
CO2: 25 mmol/L (ref 20–29)
Calcium: 9.2 mg/dL (ref 8.7–10.3)
Chloride: 106 mmol/L (ref 96–106)
Creatinine, Ser: 0.94 mg/dL (ref 0.57–1.00)
GFR calc Af Amer: 76 mL/min/{1.73_m2} (ref >59)
GFR calc non Af Amer: 66 mL/min/{1.73_m2} (ref >59)
Glucose: 89 mg/dL (ref 65–99)
Potassium: 4.1 mmol/L (ref 3.5–5.2)
Sodium: 143 mmol/L (ref 134–144)

## 2019-09-02 NOTE — Telephone Encounter (Signed)
Too late for adding tests. Thanks.

## 2019-09-03 NOTE — Telephone Encounter (Signed)
Left a msg that it was too late to add on the Vitamin D. Recommend her to come by the office to give another sample in order for [place] to check vit d

## 2019-09-11 ENCOUNTER — Ambulatory Visit: Payer: BC Managed Care – PPO | Admitting: Emergency Medicine

## 2019-09-11 ENCOUNTER — Other Ambulatory Visit: Payer: Self-pay

## 2019-09-11 ENCOUNTER — Encounter: Payer: Self-pay | Admitting: Emergency Medicine

## 2019-09-11 VITALS — BP 123/82 | HR 84 | Temp 97.9°F | Resp 17 | Ht 66.0 in | Wt 198.0 lb

## 2019-09-11 DIAGNOSIS — E559 Vitamin D deficiency, unspecified: Secondary | ICD-10-CM | POA: Diagnosis not present

## 2019-09-11 DIAGNOSIS — Z8719 Personal history of other diseases of the digestive system: Secondary | ICD-10-CM

## 2019-09-11 DIAGNOSIS — R109 Unspecified abdominal pain: Secondary | ICD-10-CM | POA: Diagnosis not present

## 2019-09-11 DIAGNOSIS — R1083 Colic: Secondary | ICD-10-CM

## 2019-09-11 LAB — POCT URINALYSIS DIP (MANUAL ENTRY)
Bilirubin, UA: NEGATIVE
Glucose, UA: NEGATIVE mg/dL
Ketones, POC UA: NEGATIVE mg/dL
Nitrite, UA: NEGATIVE
Protein Ur, POC: NEGATIVE mg/dL
Spec Grav, UA: >=1.03 — AB (ref 1.010–1.025)
Urobilinogen, UA: 0.2 E.U./dL
pH, UA: 5 (ref 5.0–8.0)

## 2019-09-11 LAB — POCT CBC
Granulocyte percent: 3.8 %G — AB (ref 37–80)
HCT, POC: 42.5 % — AB (ref 29–41)
Hemoglobin: 13.5 g/dL (ref 11–14.6)
Lymph, poc: 31.6 — AB (ref 0.6–3.4)
MCH, POC: 23.8 pg — AB (ref 27–31.2)
MCHC: 31.8 g/dL (ref 31.8–35.4)
MCV: 74.9 fL — AB (ref 76–111)
MID (cbc): 64.1 — AB (ref 0–0.9)
MPV: 7.7 fL (ref 0–99.8)
POC Granulocyte: 0.3 — AB (ref 2–6.9)
POC LYMPH PERCENT: 4.3 %L — AB (ref 10–50)
POC MID %: 1.9 %M (ref 0–12)
Platelet Count, POC: 214 10*3/uL (ref 142–424)
RBC: 5.66 M/uL — AB (ref 4.04–5.48)
RDW, POC: 14.4 %
WBC: 6 10*3/uL (ref 4.6–10.2)

## 2019-09-11 LAB — POC MICROSCOPIC URINALYSIS (UMFC): Mucus: ABSENT

## 2019-09-11 MED ORDER — HYOSCYAMINE SULFATE SL 0.125 MG SL SUBL: 1.0000 | SUBLINGUAL_TABLET | Freq: Four times a day (QID) | SUBLINGUAL | 1 refills | Status: DC | PRN

## 2019-09-11 NOTE — Patient Instructions (Addendum)
     If you have lab work done today you will be contacted with your lab results within the next 2 weeks.  If you have not heard from us then please contact us. The fastest way to get your results is to register for My Chart.   IF you received an x-ray today, you will receive an invoice from [facility]. Please contact [facility] at [phone] with questions or concerns regarding your invoice.   IF you received labwork today, you will receive an invoice from [facility]. Please contact [facility] at [phone] with questions or concerns regarding your invoice.   Our billing staff will not be able to assist you with questions regarding bills from these companies.  You will be contacted with the lab results as soon as they are available. The fastest way to get your results is to activate your My Chart account. Instructions are located on the last page of this paperwork. If you have not heard from us regarding the results in 2 weeks, please contact this office.     Abdominal Pain, Adult  Many things can cause belly (abdominal) pain. Most times, belly pain is not dangerous. Many cases of belly pain can be watched and treated at home. Sometimes belly pain is serious, though. Your doctor will try to find the cause of your belly pain. Follow these instructions at home:  Take over-the-counter and prescription medicines only as told by your doctor. Do not take medicines that help you poop (laxatives) unless told to by your doctor.  Drink enough fluid to keep your pee (urine) clear or pale yellow.  Watch your belly pain for any changes.  Keep all follow-up visits as told by your doctor. This is important. Contact a doctor if:  Your belly pain changes or gets worse.  You are not hungry, or you lose weight without trying.  You are having trouble pooping (constipated) or have watery poop (diarrhea) for more than 2-3 days.  You have pain when you pee or poop.  Your belly  pain wakes you up at night.  Your pain gets worse with meals, after eating, or with certain foods.  You are throwing up and cannot keep anything down.  You have a fever. Get help right away if:  Your pain does not go away as soon as your doctor says it should.  You cannot stop throwing up.  Your pain is only in areas of your belly, such as the right side or the left lower part of the belly.  You have bloody or black poop, or poop that looks like tar.  You have very bad pain, cramping, or bloating in your belly.  You have signs of not having enough fluid or water in your body (dehydration), such as: ? Dark pee, very little pee, or no pee. ? Cracked lips. ? Dry mouth. ? Sunken eyes. ? Sleepiness. ? Weakness. This information is not intended to replace advice given to you by your health care provider. Make sure you discuss any questions you have with your health care provider. Document Released: 02/16/2008 Document Revised: 03/19/2016 Document Reviewed: 02/11/2016 Elsevier Interactive Patient Education  2020 [employer]  

## 2019-09-11 NOTE — Progress Notes (Addendum)
[name] 58 y.o.   Chief Complaint  Patient presents with  . Abdominal Pain    pain locatin on pt's lower left side and center of pt's abdominal area. 5/10 pain leve curently. pt states its a cramping pain with pressure.    HISTORY OF PRESENT ILLNESS: This is a 58 y.o. female complaining of intermittent episodes of generalized abdominal pain, mostly right-sided, for the past several days with no associated symptoms.  Able to eat and drink.  Denies nausea or vomiting.  Had mild diarrhea yesterday but no blood in the stools.  Denies fever or chills.  Denies urinary symptoms.  Denies any other significant symptoms.  HPI   Prior to Admission medications   Medication Sig Start Date End Date Taking? Authorizing Provider  CALCIUM PO Take 2 tablets by mouth daily.   Yes [provider]  cholecalciferol (VITAMIN D) 1000 UNITS tablet Take 2,000 Units by mouth daily.    Yes [provider]  losartan (COZAAR) 50 MG tablet Take 50 mg by mouth daily.   Yes [provider]  metoprolol succinate (TOPROL-XL) 25 MG 24 hr tablet Take 25 mg by mouth daily.   Yes [provider]  PARoxetine (PAXIL CR) 25 MG 24 hr tablet Take 25 mg by mouth daily.   Yes [provider]  potassium chloride SA (KLOR-CON) 20 MEQ tablet Take 1 tablet (20 mEq total) by mouth 3 (three) times daily. 08/24/19  Yes Rutherford Guys, MD  TURMERIC PO Take by mouth.   Yes [provider]  polyethylene glycol powder (GLYCOLAX/MIRALAX) 17 GM/SCOOP powder Take 17 g by mouth as directed. Patient not taking: Reported on 09/11/2019 08/24/19   Rutherford Guys, MD    Allergies  Allergen Reactions  . Ceftriaxone Sodium Itching  . Doxycycline Rash    Patient Active Problem List   Diagnosis Date Noted  . Stress incontinence of urine 10/31/2018  . History of diverticulosis 05/04/2017  . History of PSVT (paroxysmal supraventricular tachycardia) 06/24/2012  . Hx of cardiac  catheterization   . Multinodular goiter 05/12/2012  . SLEEP APNEA 01/19/2008  . IRRITABLE BOWEL SYNDROME 01/18/2008    Past Medical History:  Diagnosis Date  . Anemia   . Anxiety   . Colon polyp 08/28/2012   Tubular adenoma  . Dense breast   . Depression   . Genital warts   . GERD (gastroesophageal reflux disease)   . Heart murmur   . Hemorrhoids   . Hx of cardiac catheterization 2009   normal coronary arteries  . Hx of menorrhagia    Novasure  . Hypokalemia   . Hypomagnesemia   . Hypophosphatemia   . Lactose intolerance in adult   . Mitral valve prolapse   . Normal cardiac stress test 03/09/2012   no ischemia  . Positive PPD   . Rapid heart beat    States "irreg heart beat"  . Thyroid disease    Thyroid nodules    Past Surgical History:  Procedure Laterality Date  . CARDIAC CATHETERIZATION     x2 with normal results per pt  . NOVASURE ABLATION    . UPPER GASTROINTESTINAL ENDOSCOPY  May 2013    Social History   Socioeconomic History  . Marital status: Divorced    Spouse name: Not on file  . Number of children: 2  . Years of education: 28  . Highest education level: Not on file  Occupational History  . Occupation: dialysis Engineer, production:  [employer]  Tobacco Use  . Smoking status: Never Smoker  . Smokeless tobacco: Never Used  Substance and Sexual Activity  . Alcohol use: No  . Drug use: No  . Sexual activity: Yes  Other Topics Concern  . Not on file  Social History Narrative   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 She lived in [place] and in [place] for a number of years her mom is from [place]. 16 years of education went to college at [employer] lives at home with her son who is in his 43s no pets   Neg ets tob etoh hx PA   6 hours of sleep   G2P2    TD2010  colonoscopy 2009   Now running  own business    HH  of 2    Left handed    Caffeine use: tea sometimes         Social Determinants of Health   Financial Resource Strain:   . Difficulty of Paying Living Expenses: Not on file  Food Insecurity:   . Worried About Charity fundraiser in the Last Year: Not on file  . Ran Out of Food in the Last Year: Not on file  Transportation Needs:   . Lack of Transportation (Medical): Not on file  . Lack of Transportation (Non-Medical): Not on file  Physical Activity:   . Days of Exercise per Week: Not on file  . Minutes of Exercise per Session: Not on file  Stress:   . Feeling of Stress : Not on file  Social Connections:   . Frequency of Communication with Friends and Family: Not on file  . Frequency of Social Gatherings with Friends and Family: Not on file  . Attends Religious Services: Not on file  . Active Member of Clubs or Organizations: Not on file  . Attends Archivist Meetings: Not on file  . Marital Status: Not on file  Intimate Partner Violence:   . Fear of Current or Ex-Partner: Not on file  . Emotionally Abused: Not on file  . Physically Abused: Not on file  . Sexually Abused: Not on file    Family History  Problem Relation Age of Onset  . Heart disease Father        died Mi 81  . Hypertension Maternal Grandfather   . Diabetes Maternal Grandfather   . Heart disease Maternal Grandfather   . Colon cancer Maternal Grandfather   . Rectal cancer Maternal Grandfather   . Diabetes Maternal Grandmother   . Prostate cancer Neg Hx   . Breast cancer Neg Hx      Review of Systems  Constitutional: Negative.  Negative for chills and fever.  HENT: Negative.  Negative for congestion and sore throat.   Respiratory: Negative.  Negative for cough and shortness of breath.   Cardiovascular: Negative.  Negative for chest pain and palpitations.  Gastrointestinal: Positive for abdominal pain. Negative for blood in stool, constipation, diarrhea, heartburn, melena, nausea and  vomiting.  Genitourinary: Negative.  Negative for dysuria, flank pain, hematuria and urgency.  Musculoskeletal: Negative.   Skin: Negative.  Negative for rash.  Neurological: Negative.  Negative for dizziness and headaches.  All other systems reviewed and are negative.    Today's Vitals   09/11/19 1443  BP: 123/82  Pulse: 84  Resp: 17  Temp: 97.9 F (36.6 C)  TempSrc: Oral  SpO2: 97%  Weight: 198 lb (89.8 kg)  Height: 5\' 6"  (1.676 m)   Body mass index is 31.96 kg/m.   Physical Exam Vitals reviewed.  Constitutional:      Appearance: She is well-developed.  HENT:     Head: Normocephalic.  Eyes:     Extraocular Movements: Extraocular movements intact.     Pupils: Pupils are equal, round, and reactive to light.  Cardiovascular:     Rate and Rhythm: Normal rate and regular rhythm.     Pulses: Normal pulses.     Heart sounds: Normal heart sounds.  Pulmonary:     Effort: Pulmonary effort is normal.     Breath sounds: Normal breath sounds.  Abdominal:     General: Abdomen is flat. There is no distension.     Palpations: Abdomen is soft. There is no mass.     Tenderness: There is no abdominal tenderness. There is no guarding or rebound.  Musculoskeletal:        General: Normal range of motion.     Cervical back: Normal range of motion and neck supple.  Skin:    General: Skin is warm and dry.     Capillary Refill: Capillary refill takes less than 2 seconds.  Neurological:     General: No focal deficit present.     Mental Status: She is alert and oriented to person, place, and time.  Psychiatric:        Mood and Affect: Mood normal.        Behavior: Behavior normal.    Results for orders placed or performed in visit on 09/11/19 (from the past 24 hour(s))  POCT urinalysis dipstick     Status: Abnormal   Collection Time: 09/11/19  2:59 PM  Result Value Ref Range   Color, UA yellow yellow   Clarity, UA cloudy (A) clear   Glucose, UA negative negative mg/dL    Bilirubin, UA negative negative   Ketones, POC UA negative negative mg/dL   Spec Grav, UA >=1.030 (A) 1.010 - 1.025   Blood, UA trace-lysed (A) negative   pH, UA 5.0 5.0 - 8.0   Protein Ur, POC negative negative mg/dL   Urobilinogen, UA 0.2 0.2 or 1.0 E.U./dL   Nitrite, UA Negative Negative   Leukocytes, UA Small (1+) (A) Negative  POCT Microscopic Urinalysis ([facility])     Status: Abnormal   Collection Time: 09/11/19  3:31 PM  Result Value Ref Range   WBC,UR,HPF,POC Moderate (A) None WBC/hpf   RBC,UR,HPF,POC Few (A) None RBC/hpf   Bacteria Moderate (A) None, Too numerous to count   Mucus Absent Absent   Epithelial Cells, UR Per Microscopy Moderate (A) None, Too numerous to count cells/hpf  POCT CBC     Status: Abnormal   Collection Time: 09/11/19  3:54 PM  Result Value Ref Range   WBC 6.0 4.6 - 10.2 K/uL   Lymph, poc 31.6 (A) 0.6 - 3.4   POC LYMPH PERCENT 4.3 (A) 10 - 50 %L   MID (cbc) 64.1 (A) 0 - 0.9   POC MID % 1.9 0 - 12 %M   POC Granulocyte 0.3 (A) 2 - 6.9   Granulocyte percent 3.8 (A) 37 - 80 %G   RBC 5.66 (A) 4.04 - 5.48 M/uL   Hemoglobin 13.5 11 - 14.6 g/dL   HCT, POC 42.5 (A) 29 - 41 %   MCV 74.9 (A) 76 - 111 fL   MCH, POC 23.8 (A) 27 -  31.2 pg   MCHC 31.8 31.8 - 35.4 g/dL   RDW, POC 14.4 %   Platelet Count, POC 214 142 - 424 K/uL   MPV 7.7 0 - 99.8 fL    A total of 25 minutes was spent in the room with the patient, greater than 50% of which was in counseling/coordination of care regarding differential diagnosis of abdominal pain, management approach, medication for abdominal colic, diet and nutrition, importance of hydration, review of blood work and urinalysis, prognosis, ED precautions, and need for follow-up if no better or worse over the next several days.   ASSESSMENT & PLAN: Izzie was seen today for abdominal pain.  Diagnoses and all orders for this visit:  Abdominal pain, unspecified abdominal location -     POCT urinalysis dipstick -     POCT  Microscopic Urinalysis ([facility]) -     POCT CBC -     Urine culture -     Hyoscyamine Sulfate SL (LEVSIN/SL) 0.125 MG SUBL; Place 1 tablet under the tongue every 6 (six) hours as needed. -     Vitamin D 25 hydroxy  Abdominal colic  History of irritable bowel syndrome  History of diverticulosis  Vitamin D deficiency     Patient Instructions       If you have lab work done today you will be contacted with your lab results within the next 2 weeks.  If you have not heard from [place] then please contact us. The fastest way to get your results is to register for My Chart.   IF you received an x-ray today, you will receive an invoice from [facility]. Please contact [facility] at [phone] with questions or concerns regarding your invoice.   IF you received labwork today, you will receive an invoice from [facility]. Please contact [facility] at [phone] with questions or concerns regarding your invoice.   Our billing staff will not be able to assist you with questions regarding bills from these companies.  You will be contacted with the lab results as soon as they are available. The fastest way to get your results is to activate your My Chart account. Instructions are located on the last page of this paperwork. If you have not heard from [place] regarding the results in 2 weeks, please contact this office.     Abdominal Pain, Adult  Many things can cause belly (abdominal) pain. Most times, belly pain is not dangerous. Many cases of belly pain can be watched and treated at home. Sometimes belly pain is serious, though. Your doctor will try to find the cause of your belly pain. Follow these instructions at home:  Take over-the-counter and prescription medicines only as told by your doctor. Do not take medicines that help you poop (laxatives) unless told to by your doctor.  Drink enough fluid to keep your pee (urine) clear or pale yellow.  Watch your belly pain for any  changes.  Keep all follow-up visits as told by your doctor. This is important. Contact a doctor if:  Your belly pain changes or gets worse.  You are not hungry, or you lose weight without trying.  You are having trouble pooping (constipated) or have watery poop (diarrhea) for more than 2-3 days.  You have pain when you pee or poop.  Your belly pain wakes you up at night.  Your pain gets worse with meals, after eating, or with certain foods.  You are throwing up and cannot keep anything down.  You have a  fever. Get help right away if:  Your pain does not go away as soon as your doctor says it should.  You cannot stop throwing up.  Your pain is only in areas of your belly, such as the right side or the left lower part of the belly.  You have bloody or black poop, or poop that looks like tar.  You have very bad pain, cramping, or bloating in your belly.  You have signs of not having enough fluid or water in your body (dehydration), such as: ? Dark pee, very little pee, or no pee. ? Cracked lips. ? Dry mouth. ? Sunken eyes. ? Sleepiness. ? Weakness. This information is not intended to replace advice given to you by your health care provider. Make sure you discuss any questions you have with your health care provider. Document Released: 02/16/2008 Document Revised: 03/19/2016 Document Reviewed: 02/11/2016 Elsevier Interactive Patient Education  2020 Elsevier Inc.      Agustina Caroli, MD [facility]

## 2019-09-12 LAB — VITAMIN D 25 HYDROXY (VIT D DEFICIENCY, FRACTURES): Vit D, 25-Hydroxy: 33.6 ng/mL (ref 30.0–100.0)

## 2019-09-13 LAB — URINE CULTURE

## 2019-09-18 DIAGNOSIS — F331 Major depressive disorder, recurrent, moderate: Secondary | ICD-10-CM | POA: Diagnosis not present

## 2019-09-18 DIAGNOSIS — F4310 Post-traumatic stress disorder, unspecified: Secondary | ICD-10-CM | POA: Diagnosis not present

## 2019-09-18 DIAGNOSIS — F411 Generalized anxiety disorder: Secondary | ICD-10-CM | POA: Diagnosis not present

## 2019-09-24 ENCOUNTER — Other Ambulatory Visit: Payer: Self-pay | Admitting: Cardiology

## 2019-09-24 DIAGNOSIS — Z20822 Contact with and (suspected) exposure to covid-19: Secondary | ICD-10-CM | POA: Diagnosis not present

## 2019-09-25 LAB — NOVEL CORONAVIRUS, NAA: SARS-CoV-2, NAA: NOT DETECTED

## 2019-09-26 DIAGNOSIS — F411 Generalized anxiety disorder: Secondary | ICD-10-CM | POA: Diagnosis not present

## 2019-09-26 DIAGNOSIS — F331 Major depressive disorder, recurrent, moderate: Secondary | ICD-10-CM | POA: Diagnosis not present

## 2019-09-26 DIAGNOSIS — F4310 Post-traumatic stress disorder, unspecified: Secondary | ICD-10-CM | POA: Diagnosis not present

## 2019-10-02 DIAGNOSIS — F4310 Post-traumatic stress disorder, unspecified: Secondary | ICD-10-CM | POA: Diagnosis not present

## 2019-10-02 DIAGNOSIS — F411 Generalized anxiety disorder: Secondary | ICD-10-CM | POA: Diagnosis not present

## 2019-10-02 DIAGNOSIS — F331 Major depressive disorder, recurrent, moderate: Secondary | ICD-10-CM | POA: Diagnosis not present

## 2019-10-09 ENCOUNTER — Other Ambulatory Visit: Payer: Self-pay

## 2019-10-09 DIAGNOSIS — Z20822 Contact with and (suspected) exposure to covid-19: Secondary | ICD-10-CM | POA: Diagnosis not present

## 2019-10-10 LAB — NOVEL CORONAVIRUS, NAA: SARS-CoV-2, NAA: NOT DETECTED

## 2019-10-11 ENCOUNTER — Encounter (HOSPITAL_COMMUNITY): Payer: Self-pay | Admitting: Emergency Medicine

## 2019-10-11 ENCOUNTER — Other Ambulatory Visit: Payer: Self-pay

## 2019-10-11 ENCOUNTER — Ambulatory Visit (HOSPITAL_COMMUNITY)
Admission: EM | Admit: 2019-10-11 | Discharge: 2019-10-11 | Disposition: A | Discharge status: Home / Self Care | Payer: BC Managed Care – PPO | Attending: Family Medicine | Admitting: Family Medicine

## 2019-10-11 DIAGNOSIS — R011 Cardiac murmur, unspecified: Secondary | ICD-10-CM | POA: Diagnosis not present

## 2019-10-11 DIAGNOSIS — R002 Palpitations: Secondary | ICD-10-CM | POA: Diagnosis not present

## 2019-10-11 NOTE — ED Triage Notes (Signed)
Pt here for "feeling funny beat" x 3-4 days; pt sts hx of palpitations; pt sts some back pain

## 2019-10-11 NOTE — ED Provider Notes (Signed)
[place]    CSN: [id number] Arrival date & time: 10/11/19  [place]      History   Chief Complaint Chief Complaint  Patient presents with  . Palpitations  . Back Pain    HPI [name] is a 59 y.o. female.   Patient had some heart palpitations this evening.  She describes it as a pulse and her heart rate.  She has not aware of heartbeats normally but did appreciate the pause.  She has a past history of palpitations as well as mitral valve prolapse.  She has had 2 heart catheterizations in the last 12 years.  Palpitations were not associated with syncope or chest pain.  HPI  Past Medical History:  Diagnosis Date  . Anemia   . Anxiety   . Colon polyp 08/28/2012   Tubular adenoma  . Dense breast   . Depression   . Genital warts   . GERD (gastroesophageal reflux disease)   . Heart murmur   . Hemorrhoids   . Hx of cardiac catheterization 2009   normal coronary arteries  . Hx of menorrhagia    Novasure  . Hypokalemia   . Hypomagnesemia   . Hypophosphatemia   . Lactose intolerance in adult   . Mitral valve prolapse   . Normal cardiac stress test 03/09/2012   no ischemia  . Positive PPD   . Rapid heart beat    States "irreg heart beat"  . Thyroid disease    Thyroid nodules    Patient Active Problem List   Diagnosis Date Noted  . Stress incontinence of urine 10/31/2018  . History of diverticulosis 05/04/2017  . History of PSVT (paroxysmal supraventricular tachycardia) 06/24/2012  . Hx of cardiac catheterization   . Multinodular goiter 05/12/2012  . SLEEP APNEA 01/19/2008  . IRRITABLE BOWEL SYNDROME 01/18/2008    Past Surgical History:  Procedure Laterality Date  . CARDIAC CATHETERIZATION     x2 with normal results per pt  . NOVASURE ABLATION    . UPPER GASTROINTESTINAL ENDOSCOPY  May 2013    OB History   No obstetric history on file.      Home Medications    Prior to Admission medications   Medication Sig Start Date End Date  Taking? Authorizing Provider  CALCIUM PO Take 2 tablets by mouth daily.    [provider]  cholecalciferol (VITAMIN D) 1000 UNITS tablet Take 2,000 Units by mouth daily.     [provider]  Hyoscyamine Sulfate SL (LEVSIN/SL) 0.125 MG SUBL Place 1 tablet under the tongue every 6 (six) hours as needed. 09/11/19   Horald Pollen, MD  losartan (COZAAR) 50 MG tablet Take 50 mg by mouth daily.    [provider]  metoprolol succinate (TOPROL-XL) 25 MG 24 hr tablet Take 25 mg by mouth daily.    [provider]  PARoxetine (PAXIL CR) 25 MG 24 hr tablet Take 25 mg by mouth daily.    [provider]  polyethylene glycol powder (GLYCOLAX/MIRALAX) 17 GM/SCOOP powder Take 17 g by mouth as directed. Patient not taking: Reported on 09/11/2019 08/24/19   Rutherford Guys, MD  potassium chloride SA (KLOR-CON) 20 MEQ tablet Take 1 tablet (20 mEq total) by mouth 3 (three) times daily. 08/24/19   Rutherford Guys, MD  TURMERIC PO Take by mouth.    [provider]    Family History Family History  Problem Relation Age of Onset  . Heart disease Father  died Mi 61  . Hypertension Maternal Grandfather   . Diabetes Maternal Grandfather   . Heart disease Maternal Grandfather   . Colon cancer Maternal Grandfather   . Rectal cancer Maternal Grandfather   . Diabetes Maternal Grandmother   . Prostate cancer Neg Hx   . Breast cancer Neg Hx     Social History Social History   Tobacco Use  . Smoking status: Never Smoker  . Smokeless tobacco: Never Used  Substance Use Topics  . Alcohol use: No  . Drug use: No     Allergies   Ceftriaxone sodium and Doxycycline   Review of Systems Review of Systems  Cardiovascular: Positive for palpitations. Negative for chest pain.  All other systems reviewed and are negative.    Physical Exam Triage Vital Signs ED Triage Vitals  Enc Vitals Group     BP 10/11/19 1932 109/75     Pulse Rate  10/11/19 1932 74     Resp 10/11/19 1932 18     Temp 10/11/19 1932 98.6 F (37 C)     Temp Source 10/11/19 1932 Oral     SpO2 10/11/19 1932 100 %     Weight --      Height --      Head Circumference --      Peak Flow --      Pain Score 10/11/19 1934 2     Pain Loc --      Pain Edu? --      Excl. in [place]? --    No data found.  Updated Vital Signs BP 109/75 (BP Location: Left Arm)   Pulse 74   Temp 98.6 F (37 C) (Oral)   Resp 18   LMP 09/18/2016 (Exact Date)   SpO2 100%   Visual Acuity Right Eye Distance:   Left Eye Distance:   Bilateral Distance:    Right Eye Near:   Left Eye Near:    Bilateral Near:     Physical Exam Vitals and nursing note reviewed.  Constitutional:      Appearance: Normal appearance.  Cardiovascular:     Rate and Rhythm: Normal rate and regular rhythm.     Pulses: Normal pulses.     Heart sounds: Murmur present.  Neurological:     Mental Status: She is alert.   EKG shows normal sinus rhythm without any acute process   UC Treatments / Results  Labs (all labs ordered are listed, but only abnormal results are displayed) Labs Reviewed - No data to display  EKG   Radiology No results found.  Procedures Procedures (including critical care time)  Medications Ordered in UC Medications - No data to display  Initial Impression / Assessment and Plan / UC Course  I have reviewed the triage vital signs and the nursing notes.  Pertinent labs & imaging results that were available during my care of the patient were reviewed by me and considered in my medical decision making (see chart for details).     Palpitations, likely premature atrial contractions. Final Clinical Impressions(s) / UC Diagnoses   Final diagnoses:  Palpitations   Discharge Instructions   None    ED Prescriptions    None     PDMP not reviewed this encounter.   Wardell Honour, MD 10/11/19 2010

## 2019-10-18 DIAGNOSIS — M545 Low back pain: Secondary | ICD-10-CM | POA: Diagnosis not present

## 2019-10-18 DIAGNOSIS — E663 Overweight: Secondary | ICD-10-CM | POA: Diagnosis not present

## 2019-10-18 DIAGNOSIS — R002 Palpitations: Secondary | ICD-10-CM | POA: Diagnosis not present

## 2019-10-18 DIAGNOSIS — R072 Precordial pain: Secondary | ICD-10-CM | POA: Diagnosis not present

## 2019-10-19 ENCOUNTER — Observation Stay (HOSPITAL_COMMUNITY)
Admission: AD | Admit: 2019-10-19 | Discharge: 2019-10-20 | Disposition: A | Discharge status: Home / Self Care | Payer: BC Managed Care – PPO | Source: Ambulatory Visit | Attending: Cardiovascular Disease | Admitting: Cardiovascular Disease

## 2019-10-19 DIAGNOSIS — F418 Other specified anxiety disorders: Secondary | ICD-10-CM | POA: Insufficient documentation

## 2019-10-19 DIAGNOSIS — R0789 Other chest pain: Principal | ICD-10-CM | POA: Insufficient documentation

## 2019-10-19 DIAGNOSIS — I471 Supraventricular tachycardia: Secondary | ICD-10-CM | POA: Insufficient documentation

## 2019-10-19 DIAGNOSIS — I10 Essential (primary) hypertension: Secondary | ICD-10-CM | POA: Diagnosis not present

## 2019-10-19 DIAGNOSIS — E669 Obesity, unspecified: Secondary | ICD-10-CM | POA: Insufficient documentation

## 2019-10-19 DIAGNOSIS — Z8679 Personal history of other diseases of the circulatory system: Secondary | ICD-10-CM

## 2019-10-19 DIAGNOSIS — M545 Low back pain: Secondary | ICD-10-CM | POA: Diagnosis not present

## 2019-10-19 DIAGNOSIS — I249 Acute ischemic heart disease, unspecified: Secondary | ICD-10-CM | POA: Diagnosis present

## 2019-10-19 DIAGNOSIS — R002 Palpitations: Secondary | ICD-10-CM | POA: Diagnosis not present

## 2019-10-19 DIAGNOSIS — Z20822 Contact with and (suspected) exposure to covid-19: Secondary | ICD-10-CM | POA: Diagnosis not present

## 2019-10-19 DIAGNOSIS — E663 Overweight: Secondary | ICD-10-CM | POA: Diagnosis not present

## 2019-10-19 DIAGNOSIS — R072 Precordial pain: Secondary | ICD-10-CM | POA: Diagnosis not present

## 2019-10-19 LAB — CBC WITH DIFFERENTIAL/PLATELET
Abs Immature Granulocytes: 0.01 10*3/uL (ref 0.00–0.07)
Basophils Absolute: 0 10*3/uL (ref 0.0–0.1)
Basophils Relative: 0 %
Eosinophils Absolute: 0.1 10*3/uL (ref 0.0–0.5)
Eosinophils Relative: 1 %
HCT: 39.3 % (ref 36.0–46.0)
Hemoglobin: 12.5 g/dL (ref 12.0–15.0)
Immature Granulocytes: 0 %
Lymphocytes Relative: 26 %
Lymphs Abs: 1.4 10*3/uL (ref 0.7–4.0)
MCH: 23.9 pg — ABNORMAL LOW (ref 26.0–34.0)
MCHC: 31.8 g/dL (ref 30.0–36.0)
MCV: 75.3 fL — ABNORMAL LOW (ref 80.0–100.0)
Monocytes Absolute: 0.7 10*3/uL (ref 0.1–1.0)
Monocytes Relative: 12 %
Neutro Abs: 3.4 10*3/uL (ref 1.7–7.7)
Neutrophils Relative %: 61 %
Platelets: 207 10*3/uL (ref 150–400)
RBC: 5.22 MIL/uL — ABNORMAL HIGH (ref 3.87–5.11)
RDW: 14.1 % (ref 11.5–15.5)
WBC: 5.6 10*3/uL (ref 4.0–10.5)
nRBC: 0 % (ref 0.0–0.2)

## 2019-10-19 LAB — HIV ANTIBODY (ROUTINE TESTING W REFLEX): HIV Screen 4th Generation wRfx: NONREACTIVE

## 2019-10-19 LAB — BASIC METABOLIC PANEL
Anion gap: 7 (ref 5–15)
BUN: 8 mg/dL (ref 6–20)
CO2: 28 mmol/L (ref 22–32)
Calcium: 9.2 mg/dL (ref 8.9–10.3)
Chloride: 108 mmol/L (ref 98–111)
Creatinine, Ser: 0.9 mg/dL (ref 0.44–1.00)
GFR calc Af Amer: >60 mL/min (ref >60)
GFR calc non Af Amer: >60 mL/min (ref >60)
Glucose, Bld: 89 mg/dL (ref 70–99)
Potassium: 3.6 mmol/L (ref 3.5–5.1)
Sodium: 143 mmol/L (ref 135–145)

## 2019-10-19 LAB — TROPONIN I (HIGH SENSITIVITY)
Troponin I (High Sensitivity): <2 ng/L (ref <18)
Troponin I (High Sensitivity): <2 ng/L (ref <18)

## 2019-10-19 LAB — SARS CORONAVIRUS 2 (TAT 6-24 HRS): SARS Coronavirus 2: NEGATIVE

## 2019-10-19 LAB — APTT: aPTT: 32 seconds (ref 24–36)

## 2019-10-19 LAB — PROTIME-INR
INR: 1 (ref 0.8–1.2)
Prothrombin Time: 13.2 seconds (ref 11.4–15.2)

## 2019-10-19 MED ORDER — PAROXETINE HCL ER 12.5 MG PO TB24
25.0000 mg | ORAL_TABLET | Freq: Every day | ORAL | Status: DC
  Filled 2019-10-19 (×2): qty 1
  Filled 2019-10-19: qty 2

## 2019-10-19 MED ORDER — ASPIRIN EC 81 MG PO TBEC
81.0000 mg | DELAYED_RELEASE_TABLET | Freq: Every day | ORAL | Status: DC
  Administered 2019-10-20: 12:00:00 81 mg via ORAL
  Filled 2019-10-19: qty 1

## 2019-10-19 MED ORDER — SODIUM CHLORIDE 0.9% FLUSH: 3.0000 mL | INTRAVENOUS | Status: DC | PRN

## 2019-10-19 MED ORDER — HEPARIN (PORCINE) 25000 UT/250ML-% IV SOLN
950.0000 [IU]/h | INTRAVENOUS | Status: DC
  Administered 2019-10-19: 19:00:00 950 [IU]/h via INTRAVENOUS
  Filled 2019-10-19: qty 250

## 2019-10-19 MED ORDER — NITROGLYCERIN 0.4 MG SL SUBL: 0.4000 mg | SUBLINGUAL_TABLET | SUBLINGUAL | Status: DC | PRN

## 2019-10-19 MED ORDER — ASPIRIN 300 MG RE SUPP: 300.0000 mg | RECTAL | Status: AC

## 2019-10-19 MED ORDER — ONDANSETRON HCL 4 MG/2ML IJ SOLN: 4.0000 mg | Freq: Four times a day (QID) | INTRAMUSCULAR | Status: DC | PRN

## 2019-10-19 MED ORDER — ACETAMINOPHEN 325 MG PO TABS: 650.0000 mg | ORAL_TABLET | ORAL | Status: DC | PRN

## 2019-10-19 MED ORDER — LOSARTAN POTASSIUM 50 MG PO TABS
50.0000 mg | ORAL_TABLET | Freq: Every day | ORAL | Status: DC
  Filled 2019-10-19: qty 1

## 2019-10-19 MED ORDER — METOPROLOL SUCCINATE ER 25 MG PO TB24
25.0000 mg | ORAL_TABLET | Freq: Every day | ORAL | Status: DC
  Administered 2019-10-19 – 2019-10-20 (×2): 25 mg via ORAL
  Filled 2019-10-19 (×2): qty 1

## 2019-10-19 MED ORDER — HEPARIN BOLUS VIA INFUSION
4000.0000 [IU] | Freq: Once | INTRAVENOUS | Status: AC
  Administered 2019-10-19: 19:00:00 4000 [IU] via INTRAVENOUS
  Filled 2019-10-19: qty 4000

## 2019-10-19 MED ORDER — SODIUM CHLORIDE 0.9% FLUSH
3.0000 mL | Freq: Two times a day (BID) | INTRAVENOUS | Status: DC
  Administered 2019-10-19: 3 mL via INTRAVENOUS

## 2019-10-19 MED ORDER — POTASSIUM CHLORIDE CRYS ER 20 MEQ PO TBCR
20.0000 meq | EXTENDED_RELEASE_TABLET | Freq: Three times a day (TID) | ORAL | Status: DC
  Administered 2019-10-19: 21:00:00 20 meq via ORAL
  Filled 2019-10-19: qty 1

## 2019-10-19 MED ORDER — ATORVASTATIN CALCIUM 40 MG PO TABS
40.0000 mg | ORAL_TABLET | Freq: Every day | ORAL | Status: DC
  Administered 2019-10-19: 19:00:00 40 mg via ORAL
  Filled 2019-10-19: qty 1

## 2019-10-19 MED ORDER — TRAZODONE HCL 50 MG PO TABS
50.0000 mg | ORAL_TABLET | Freq: Every evening | ORAL | Status: DC | PRN
  Administered 2019-10-19: 50 mg via ORAL
  Filled 2019-10-19: qty 1

## 2019-10-19 MED ORDER — ASPIRIN 81 MG PO CHEW
324.0000 mg | CHEWABLE_TABLET | ORAL | Status: AC
  Administered 2019-10-19: 19:00:00 324 mg via ORAL
  Filled 2019-10-19: qty 4

## 2019-10-19 MED ORDER — SODIUM CHLORIDE 0.9 % IV SOLN: 250.0000 mL | INTRAVENOUS | Status: DC | PRN

## 2019-10-19 NOTE — H&P (Signed)
Referring Physician: Doylene Canard, MD  [name] is an 59 y.o. female.                       Chief Complaint: Chest pain  HPI: 59 years old black female presented with chest pain as pressure with feeling tired and occasional left sided jaw pain. She has PMH of SVT, Anemia, palpitations, Heart murmur and hypokalemia. She denies fever, chills and cough. Her EKG shows inferior and lateral wall ischemia.  Past Medical History:  Diagnosis Date  . Anemia   . Anxiety   . Colon polyp 08/28/2012   Tubular adenoma  . Dense breast   . Depression   . Genital warts   . GERD (gastroesophageal reflux disease)   . Heart murmur   . Hemorrhoids   . Hx of cardiac catheterization 2009   normal coronary arteries  . Hx of menorrhagia    Novasure  . Hypokalemia   . Hypomagnesemia   . Hypophosphatemia   . Lactose intolerance in adult   . Mitral valve prolapse   . Normal cardiac stress test 03/09/2012   no ischemia  . Positive PPD   . Rapid heart beat    States "irreg heart beat"  . Thyroid disease    Thyroid nodules      Past Surgical History:  Procedure Laterality Date  . CARDIAC CATHETERIZATION     x2 with normal results per pt  . NOVASURE ABLATION    . UPPER GASTROINTESTINAL ENDOSCOPY  May 2013    Family History  Problem Relation Age of Onset  . Heart disease Father        died Mi 47  . Hypertension Maternal Grandfather   . Diabetes Maternal Grandfather   . Heart disease Maternal Grandfather   . Colon cancer Maternal Grandfather   . Rectal cancer Maternal Grandfather   . Diabetes Maternal Grandmother   . Prostate cancer Neg Hx   . Breast cancer Neg Hx    Social History:  reports that she has never smoked. She has never used smokeless tobacco. She reports that she does not drink alcohol or use drugs.  Allergies:  Allergies  Allergen Reactions  . Ceftriaxone Sodium Itching  . Doxycycline Rash    Facility-Administered Medications Prior to Admission  Medication Dose  Route Frequency Provider Last Rate Last Admin  . [DISCONTINUED] 0.9 %  sodium chloride infusion  500 mL Intravenous Once Pyrtle, Lajuan Lines, MD       Medications Prior to Admission  Medication Sig Dispense Refill  . CALCIUM PO Take 2 tablets by mouth daily.    . cholecalciferol (VITAMIN D) 1000 UNITS tablet Take 2,000 Units by mouth daily.     [name] Hyoscyamine Sulfate SL (LEVSIN/SL) 0.125 MG SUBL Place 1 tablet under the tongue every 6 (six) hours as needed. 20 tablet 1  . losartan (COZAAR) 50 MG tablet Take 50 mg by mouth daily.    . metoprolol succinate (TOPROL-XL) 25 MG 24 hr tablet Take 25 mg by mouth daily.    [name] PARoxetine (PAXIL CR) 25 MG 24 hr tablet Take 25 mg by mouth daily.    . polyethylene glycol powder (GLYCOLAX/MIRALAX) 17 GM/SCOOP powder Take 17 g by mouth as directed. (Patient not taking: Reported on 09/11/2019) 225 g 1  . potassium chloride SA (KLOR-CON) 20 MEQ tablet Take 1 tablet (20 mEq total) by mouth 3 (three) times daily. 90 tablet 0  . TURMERIC PO Take by mouth.  No results found for this or any previous visit (from the past 48 hour(s)). No results found.  Review Of Systems Constitutional: No fever, chills, weight loss or gain. Eyes: No vision change, wears glasses. No discharge or pain. Ears: No hearing loss, No tinnitus. Respiratory: No asthma, COPD, pneumonias. Positive shortness of breath. No hemoptysis. Cardiovascular: Positive chest pain, palpitation, leg edema. Gastrointestinal: Positive nausea, vomiting, diarrhea, constipation. No GI bleed. No hepatitis. Genitourinary: No dysuria, hematuria, kidney stone. No incontinance. Neurological: No headache, stroke, seizures.  Psychiatry: No psych facility admission for anxiety, depression, suicide. No detox. Skin: No rash. Musculoskeletal: No joint pain, fibromyalgia. No neck pain, back pain. Lymphadenopathy: No lymphadenopathy. Hematology: Positive anemia or easy bruising.   Blood pressure 124/83, pulse 67,  temperature 98.2 F (36.8 C), temperature source Oral, resp. rate 17, height 5\' 6"  (1.676 m), weight 90.2 kg, last menstrual period 09/18/2016, SpO2 99 %. Body mass index is 32.09 kg/m. General appearance: alert, cooperative, appears stated age and no distress Head: Normocephalic, atraumatic. Eyes: Brown eyes, pink conjunctiva, corneas clear. PERRL, EOM's intact. Neck: No adenopathy, no carotid bruit, no JVD, supple, symmetrical, trachea midline and thyroid not enlarged. Resp: Clear to auscultation bilaterally. Chest wall mildly tender on palpation. Cardio: Regular rate and rhythm, S1, S2 normal, II/VI systolic murmur, no click, rub or gallop GI: Soft, non-tender; bowel sounds normal; no organomegaly. Extremities: No edema, cyanosis or clubbing. Skin: Warm and dry.  Neurologic: Alert and oriented X 3, normal strength. Normal coordination and gait.  Assessment/Plan Acute coronary syndrome HTN Obesity H/O SVT  Place in observation. Cycle HS-Troponin I. IV heparin NM myocardial perfusion stress test in AM.  Time spent: Review of old records, Lab, x-rays, EKG, other cardiac tests, examination, discussion with patient over 70 minutes.  Birdie Riddle, MD  10/19/2019, 5:59 PM

## 2019-10-19 NOTE — Progress Notes (Addendum)
ANTICOAGULATION CONSULT NOTE - Initial Consult  Pharmacy Consult for heparin Indication: chest pain/ACS  Allergies  Allergen Reactions  . Ceftriaxone Sodium Itching  . Doxycycline Rash    Patient Measurements: Height: 5\' 6"  (167.6 cm)(from Dec) Weight: 198 lb (89.8 kg)(from Dec) IBW/kg (Calculated) : 59.3 Heparin Dosing Weight: 79 kg  Vital Signs:    Labs: No results for input(s): HGB, HCT, PLT, APTT, LABPROT, INR, HEPARINUNFRC, HEPRLOWMOCWT, CREATININE, CKTOTAL, CKMB, TROPONINIHS in the last 72 hours.  CrCl cannot be calculated (Patient's most recent lab result is older than the maximum 21 days allowed.).   Medical History: Past Medical History:  Diagnosis Date  . Anemia   . Anxiety   . Colon polyp 08/28/2012   Tubular adenoma  . Dense breast   . Depression   . Genital warts   . GERD (gastroesophageal reflux disease)   . Heart murmur   . Hemorrhoids   . Hx of cardiac catheterization 2009   normal coronary arteries  . Hx of menorrhagia    Novasure  . Hypokalemia   . Hypomagnesemia   . Hypophosphatemia   . Lactose intolerance in adult   . Mitral valve prolapse   . Normal cardiac stress test 03/09/2012   no ischemia  . Positive PPD   . Rapid heart beat    States "irreg heart beat"  . Thyroid disease    Thyroid nodules    Medications:  Facility-Administered Medications Prior to Admission  Medication Dose Route Frequency Provider Last Rate Last Admin  . [DISCONTINUED] 0.9 %  sodium chloride infusion  500 mL Intravenous Once Pyrtle, Lajuan Lines, MD       Medications Prior to Admission  Medication Sig Dispense Refill Last Dose  . CALCIUM PO Take 2 tablets by mouth daily.     . cholecalciferol (VITAMIN D) 1000 UNITS tablet Take 2,000 Units by mouth daily.      [name] Hyoscyamine Sulfate SL (LEVSIN/SL) 0.125 MG SUBL Place 1 tablet under the tongue every 6 (six) hours as needed. 20 tablet 1   . losartan (COZAAR) 50 MG tablet Take 50 mg by mouth daily.     . metoprolol  succinate (TOPROL-XL) 25 MG 24 hr tablet Take 25 mg by mouth daily.     [name] PARoxetine (PAXIL CR) 25 MG 24 hr tablet Take 25 mg by mouth daily.     . polyethylene glycol powder (GLYCOLAX/MIRALAX) 17 GM/SCOOP powder Take 17 g by mouth as directed. (Patient not taking: Reported on 09/11/2019) 225 g 1   . potassium chloride SA (KLOR-CON) 20 MEQ tablet Take 1 tablet (20 mEq total) by mouth 3 (three) times daily. 90 tablet 0   . TURMERIC PO Take by mouth.       Assessment: 38 yof who presented with chest pain. Pharmacy consulted to begin IV heparin. No AC noted on PTA med list. CBC from 09/11/19 is normal.  Goal of Therapy:  Heparin level 0.3-0.7 units/ml Monitor platelets by anticoagulation protocol: Yes   Plan:  Heparin 4000 unit IV bolus then 950 units/hr 8h HL (with am labs) Daily HL, CBC Monitor for s/sx of bleeding  Thank you for involving pharmacy in this patient's care.  Renold Genta, PharmD, BCPS Clinical Pharmacist Clinical phone for 10/19/2019 until 10p is [phone] 10/19/2019 6:05 PM  **Pharmacist phone directory can be found on [email or web address] listed under [place]**

## 2019-10-20 ENCOUNTER — Observation Stay (HOSPITAL_COMMUNITY): Payer: BC Managed Care – PPO

## 2019-10-20 DIAGNOSIS — R9431 Abnormal electrocardiogram [ECG] [EKG]: Secondary | ICD-10-CM | POA: Diagnosis not present

## 2019-10-20 DIAGNOSIS — I471 Supraventricular tachycardia: Secondary | ICD-10-CM | POA: Diagnosis not present

## 2019-10-20 DIAGNOSIS — I10 Essential (primary) hypertension: Secondary | ICD-10-CM | POA: Diagnosis not present

## 2019-10-20 DIAGNOSIS — R0789 Other chest pain: Secondary | ICD-10-CM | POA: Diagnosis not present

## 2019-10-20 DIAGNOSIS — F418 Other specified anxiety disorders: Secondary | ICD-10-CM | POA: Diagnosis not present

## 2019-10-20 DIAGNOSIS — R079 Chest pain, unspecified: Secondary | ICD-10-CM | POA: Diagnosis not present

## 2019-10-20 DIAGNOSIS — Z20822 Contact with and (suspected) exposure to covid-19: Secondary | ICD-10-CM | POA: Diagnosis not present

## 2019-10-20 DIAGNOSIS — E669 Obesity, unspecified: Secondary | ICD-10-CM | POA: Diagnosis not present

## 2019-10-20 LAB — CBC
HCT: 38.3 % (ref 36.0–46.0)
Hemoglobin: 12 g/dL (ref 12.0–15.0)
MCH: 23.5 pg — ABNORMAL LOW (ref 26.0–34.0)
MCHC: 31.3 g/dL (ref 30.0–36.0)
MCV: 75.1 fL — ABNORMAL LOW (ref 80.0–100.0)
Platelets: 186 10*3/uL (ref 150–400)
RBC: 5.1 MIL/uL (ref 3.87–5.11)
RDW: 14.2 % (ref 11.5–15.5)
WBC: 6.1 10*3/uL (ref 4.0–10.5)
nRBC: 0 % (ref 0.0–0.2)

## 2019-10-20 LAB — LIPID PANEL
Cholesterol: 115 mg/dL (ref 0–200)
HDL: 41 mg/dL (ref >40)
LDL Cholesterol: 60 mg/dL (ref 0–99)
Total CHOL/HDL Ratio: 2.8 RATIO
Triglycerides: 69 mg/dL (ref <150)
VLDL: 14 mg/dL (ref 0–40)

## 2019-10-20 LAB — BASIC METABOLIC PANEL
Anion gap: 10 (ref 5–15)
BUN: 10 mg/dL (ref 6–20)
CO2: 25 mmol/L (ref 22–32)
Calcium: 8.8 mg/dL — ABNORMAL LOW (ref 8.9–10.3)
Chloride: 108 mmol/L (ref 98–111)
Creatinine, Ser: 0.96 mg/dL (ref 0.44–1.00)
GFR calc Af Amer: >60 mL/min (ref >60)
GFR calc non Af Amer: >60 mL/min (ref >60)
Glucose, Bld: 95 mg/dL (ref 70–99)
Potassium: 4 mmol/L (ref 3.5–5.1)
Sodium: 143 mmol/L (ref 135–145)

## 2019-10-20 LAB — HEPARIN LEVEL (UNFRACTIONATED)
Heparin Unfractionated: 1 IU/mL — ABNORMAL HIGH (ref 0.30–0.70)
Heparin Unfractionated: 0.46 IU/mL (ref 0.30–0.70)

## 2019-10-20 MED ORDER — TECHNETIUM TC 99M TETROFOSMIN IV KIT
30.7000 | PACK | Freq: Once | INTRAVENOUS | Status: AC | PRN
  Administered 2019-10-20: 10:00:00 30.7 via INTRAVENOUS

## 2019-10-20 MED ORDER — REGADENOSON 0.4 MG/5ML IV SOLN
0.4000 mg | Freq: Once | INTRAVENOUS | Status: AC
  Administered 2019-10-20: 0.4 mg via INTRAVENOUS
  Filled 2019-10-20: qty 5

## 2019-10-20 MED ORDER — TECHNETIUM TC 99M TETROFOSMIN IV KIT
10.4000 | PACK | Freq: Once | INTRAVENOUS | Status: AC | PRN
  Administered 2019-10-20: 10.4 via INTRAVENOUS

## 2019-10-20 MED ORDER — REGADENOSON 0.4 MG/5ML IV SOLN
INTRAVENOUS | Status: AC
  Filled 2019-10-20: qty 5

## 2019-10-20 MED ORDER — POTASSIUM CHLORIDE CRYS ER 20 MEQ PO TBCR
20.0000 meq | EXTENDED_RELEASE_TABLET | Freq: Two times a day (BID) | ORAL | Status: DC
  Administered 2019-10-20: 20 meq via ORAL
  Filled 2019-10-20: qty 1

## 2019-10-20 MED ORDER — NITROGLYCERIN 0.4 MG SL SUBL: 0.4000 mg | SUBLINGUAL_TABLET | SUBLINGUAL | 0 refills | Status: DC | PRN

## 2019-10-20 MED ORDER — ASPIRIN 81 MG PO TBEC: 81.0000 mg | DELAYED_RELEASE_TABLET | Freq: Every day | ORAL | Status: AC

## 2019-10-20 MED ORDER — HEPARIN (PORCINE) 25000 UT/250ML-% IV SOLN
750.0000 [IU]/h | INTRAVENOUS | Status: DC
  Administered 2019-10-20: 06:00:00 750 [IU]/h via INTRAVENOUS

## 2019-10-20 NOTE — Progress Notes (Signed)
[place] for heparin Indication: chest pain/ACS  Allergies  Allergen Reactions  . Ceftriaxone Sodium Itching  . Doxycycline Rash    Patient Measurements: Height: 5\' 6"  (167.6 cm) Weight: 199 lb 12.8 oz (90.6 kg) IBW/kg (Calculated) : 59.3 Heparin Dosing Weight: 79 kg  Vital Signs: Temp: 97.6 F (36.4 C) (02/06 0324) Temp Source: Oral (02/06 0324) BP: 104/64 (02/06 0324) Pulse Rate: 57 (02/06 0324)  Labs: Recent Labs    10/19/19 1837 10/19/19 2037 10/20/19 0322  HGB 12.5  --  12.0  HCT 39.3  --  38.3  PLT 207  --  186  APTT 32  --   --   LABPROT 13.2  --   --   INR 1.0  --   --   HEPARINUNFRC  --   --  1.00*  CREATININE 0.90  --   --   TROPONINIHS <2 <2  --     Estimated Creatinine Clearance: 75.3 mL/min (by C-G formula based on SCr of 0.9 mg/dL).   Assessment: 89 yof who presented with chest pain. Pharmacy consulted to begin IV heparin. No AC noted on PTA med list. CBC from 09/11/19 is normal.  Heparin level supratherapeutic (1). No bleeding noted.  Goal of Therapy:  Heparin level 0.3-0.7 units/ml Monitor platelets by anticoagulation protocol: Yes   Plan:  Hold heparin x 1 hour Restart heparin at 750 units/hr F/u 8 hr heparin level after restart  Sherlon Handing, PharmD, BCPS Please see amion for complete clinical pharmacist phone list 10/20/2019 4:38 AM

## 2019-10-20 NOTE — Progress Notes (Signed)
[place] for heparin Indication: chest pain/ACS  Allergies  Allergen Reactions  . Ceftriaxone Sodium Itching  . Doxycycline Rash    Patient Measurements: Height: 5\' 6"  (167.6 cm) Weight: 199 lb 12.8 oz (90.6 kg) IBW/kg (Calculated) : 59.3 Heparin Dosing Weight: 79 kg  Vital Signs: Temp: 97.6 F (36.4 C) (02/06 0324) Temp Source: Oral (02/06 0324) BP: 109/69 (02/06 1145) Pulse Rate: 68 (02/06 1145)  Labs: Recent Labs    10/19/19 1837 10/19/19 2037 10/20/19 0322 10/20/19 1358  HGB 12.5  --  12.0  --   HCT 39.3  --  38.3  --   PLT 207  --  186  --   APTT 32  --   --   --   LABPROT 13.2  --   --   --   INR 1.0  --   --   --   HEPARINUNFRC  --   --  1.00* 0.46  CREATININE 0.90  --  0.96  --   TROPONINIHS <2 <2  --   --     Estimated Creatinine Clearance: 70.6 mL/min (by C-G formula based on SCr of 0.96 mg/dL).   Assessment: 77 yof who presented with chest pain. Pharmacy consulted to begin IV heparin. No AC noted on PTA med list.   Heparin level this afternoon came back therapeutic at 0.46, on 750 units/hr. CBC stable. No s/sx of bleeding. Underwent stress test showing no evidence of myocardial ischemia.   Goal of Therapy:  Heparin level 0.3-0.7 units/ml Monitor platelets by anticoagulation protocol: Yes   Plan:  Discussed with MD and okay to discontinue heparin at this time  Antonietta Jewel, PharmD, [place] Pharmacist  Phone: [phone]  Please check AMION for all [place] phone numbers After 10:00 PM, call [place] [phone] 10/20/2019 2:30 PM

## 2019-10-20 NOTE — Discharge Summary (Signed)
Physician Discharge Summary  Patient ID: Berlene Claude MRN: [id number] DOB/AGE: 1961/03/17 59 y.o.  Admit date: 10/19/2019 Discharge date: 10/20/2019  Admission Diagnoses: Acute coronary syndrome HTN Obesity H/O SVT Discharge Diagnoses:  Principal Problem:   Chest pain Active Problems:   History of PSVT (paroxysmal supraventricular tachycardia)   Possible microvascular coronary artery disease   Hypertension   Obesity   Anxiety and depression  Discharged Condition: fair  Hospital Course: 59 years old black female presented with chest pressure, left jaw discomfort and feeling tired. She has PMH of PSVT, Anemia, palpitations, heart murmur and hypokalemia. She had normal Troponin I levels x 2. She had near normal myocardial perfusion stress test with EF 75 %. She may have coronary microvascular disease as evident by 1-2 mm ST depression in inferior leads post Lexiscan IV infusion. She was given SL NTG for prn use and baby aspirin daily. She will follow heart heathy diet and increase activity and reduce stress as much as possible. She will see primary doctor in 1 week and me in 2 weeks  Consults: cardiology  Significant Diagnostic Studies: labs: Near normal CBC, CMET, lipid panel and troponin I levels. Her COVID test was negative.  EKG: Normal sinus rhythm with inferioe ischemic changes.  NM myocardial perfusion stress test was negative for reversible ischemia. EF 75 %.  Treatments: cardiac meds: metoprolol, aspirin SL NTG, losartan.  Discharge Exam: Blood pressure 112/75, pulse (!) 57, temperature 97.9 F (36.6 C), temperature source Oral, resp. rate 19, height 5\' 6"  (1.676 m), weight 90.6 kg, last menstrual period 09/18/2016, SpO2 98 %. General appearance: alert, cooperative and appears stated age. Head: Normocephalic, atraumatic. Eyes: Brown eyes, pink conjunctiva, corneas clear. PERRL, EOM's intact.  Neck: No adenopathy, no carotid bruit, no JVD, supple, symmetrical,  trachea midline and thyroid not enlarged. Resp: Clear to auscultation bilaterally. Cardio: Regular rate and rhythm, S1, S2 normal, II/VI systolic murmur, no click, rub or gallop. GI: Soft, non-tender; bowel sounds normal; no organomegaly. Extremities: No edema, cyanosis or clubbing. Skin: Warm and dry.  Neurologic: Alert and oriented X 3, normal strength and tone. Normal coordination and gait.  Disposition: Discharge disposition: 01-Home or Self Care        Allergies as of 10/20/2019      Reactions   Ceftriaxone Sodium Itching   Doxycycline Rash      Medication List    STOP taking these medications   Hyoscyamine Sulfate SL 0.125 MG Subl Commonly known as: Levsin/SL   polyethylene glycol powder 17 GM/SCOOP powder Commonly known as: GLYCOLAX/MIRALAX     TAKE these medications   aspirin 81 MG EC tablet Take 1 tablet (81 mg total) by mouth daily. Start taking on: October 21, 2019   fluticasone 50 MCG/ACT nasal spray Commonly known as: FLONASE Place 1 spray into both nostrils at bedtime as needed for allergies or rhinitis.   loratadine 10 MG tablet Commonly known as: CLARITIN Take 10 mg by mouth daily as needed for allergies or rhinitis.   losartan 50 MG tablet Commonly known as: COZAAR Take 25 mg by mouth 2 (two) times daily.   metoprolol succinate 25 MG 24 hr tablet Commonly known as: TOPROL-XL Take 25 mg by mouth 2 (two) times daily.   nitroGLYCERIN 0.4 MG SL tablet Commonly known as: NITROSTAT Place 1 tablet (0.4 mg total) under the tongue every 5 (five) minutes x 3 doses as needed for chest pain.   OVER THE COUNTER MEDICATION Take 1 capsule by mouth daily. Soothe  and Heal for joint pain   OYSTER SHELL CALCIUM 250+D PO Take 2 tablets by mouth at bedtime.   Paxil CR 25 MG 24 hr tablet Generic drug: PARoxetine Take 25 mg by mouth daily.   potassium chloride SA 20 MEQ tablet Commonly known as: KLOR-CON Take 1 tablet (20 mEq total) by mouth 3 (three)  times daily. What changed: when to take this   SYSTANE OP Place 2 drops into both eyes daily.   traZODone 50 MG tablet Commonly known as: DESYREL Take 50 mg by mouth at bedtime as needed for sleep.   TURMERIC PO Take 1 capsule by mouth daily as needed (pain/inflammation).   vitamin C 1000 MG tablet Take 2,000 mg by mouth at bedtime.   Vitamin D 50 MCG (2000 UT) tablet Take 4,000 Units by mouth at bedtime.   ZINC PO Take 1 tablet by mouth at bedtime.      Follow-up Information    Horald Pollen, MD. Schedule an appointment as soon as possible for a visit in 1 week(s).   Specialty: Internal Medicine Contact information: [place] [address] [phone]        Dixie Dials, MD. Schedule an appointment as soon as possible for a visit in 2 week(s).   Specialty: Cardiology Contact information: [place] [address] [phone]           Time spent: Review of old chart, current chart, lab, x-ray, cardiac tests and discussion with patient over 60 minutes.  Signed: Birdie Riddle 10/20/2019, 4:55 PM

## 2019-10-24 ENCOUNTER — Other Ambulatory Visit: Payer: Self-pay | Admitting: Obstetrics and Gynecology

## 2019-10-24 DIAGNOSIS — Z1231 Encounter for screening mammogram for malignant neoplasm of breast: Secondary | ICD-10-CM

## 2019-10-25 DIAGNOSIS — F411 Generalized anxiety disorder: Secondary | ICD-10-CM | POA: Diagnosis not present

## 2019-10-25 DIAGNOSIS — F331 Major depressive disorder, recurrent, moderate: Secondary | ICD-10-CM | POA: Diagnosis not present

## 2019-10-25 DIAGNOSIS — F4310 Post-traumatic stress disorder, unspecified: Secondary | ICD-10-CM | POA: Diagnosis not present

## 2019-11-15 DIAGNOSIS — F331 Major depressive disorder, recurrent, moderate: Secondary | ICD-10-CM | POA: Diagnosis not present

## 2019-11-15 DIAGNOSIS — F4310 Post-traumatic stress disorder, unspecified: Secondary | ICD-10-CM | POA: Diagnosis not present

## 2019-11-15 DIAGNOSIS — F411 Generalized anxiety disorder: Secondary | ICD-10-CM | POA: Diagnosis not present

## 2019-11-19 ENCOUNTER — Ambulatory Visit
Admission: RE | Admit: 2019-11-19 | Discharge: 2019-11-19 | Disposition: A | Discharge status: Home / Self Care | Payer: BC Managed Care – PPO | Source: Ambulatory Visit | Attending: Obstetrics and Gynecology | Admitting: Obstetrics and Gynecology

## 2019-11-19 ENCOUNTER — Ambulatory Visit: Payer: BC Managed Care – PPO

## 2019-11-19 ENCOUNTER — Other Ambulatory Visit: Payer: Self-pay

## 2019-11-19 DIAGNOSIS — Z1231 Encounter for screening mammogram for malignant neoplasm of breast: Secondary | ICD-10-CM | POA: Diagnosis not present

## 2019-11-20 DIAGNOSIS — F411 Generalized anxiety disorder: Secondary | ICD-10-CM | POA: Diagnosis not present

## 2019-11-20 DIAGNOSIS — F331 Major depressive disorder, recurrent, moderate: Secondary | ICD-10-CM | POA: Diagnosis not present

## 2019-11-20 DIAGNOSIS — F4310 Post-traumatic stress disorder, unspecified: Secondary | ICD-10-CM | POA: Diagnosis not present

## 2019-11-27 DIAGNOSIS — F331 Major depressive disorder, recurrent, moderate: Secondary | ICD-10-CM | POA: Diagnosis not present

## 2019-11-27 DIAGNOSIS — F4310 Post-traumatic stress disorder, unspecified: Secondary | ICD-10-CM | POA: Diagnosis not present

## 2019-11-27 DIAGNOSIS — F411 Generalized anxiety disorder: Secondary | ICD-10-CM | POA: Diagnosis not present

## 2019-11-29 DIAGNOSIS — M545 Low back pain: Secondary | ICD-10-CM | POA: Diagnosis not present

## 2019-11-29 DIAGNOSIS — E663 Overweight: Secondary | ICD-10-CM | POA: Diagnosis not present

## 2019-11-29 DIAGNOSIS — R002 Palpitations: Secondary | ICD-10-CM | POA: Diagnosis not present

## 2019-11-29 DIAGNOSIS — R072 Precordial pain: Secondary | ICD-10-CM | POA: Diagnosis not present

## 2019-12-04 DIAGNOSIS — F331 Major depressive disorder, recurrent, moderate: Secondary | ICD-10-CM | POA: Diagnosis not present

## 2019-12-04 DIAGNOSIS — F411 Generalized anxiety disorder: Secondary | ICD-10-CM | POA: Diagnosis not present

## 2019-12-04 DIAGNOSIS — F4310 Post-traumatic stress disorder, unspecified: Secondary | ICD-10-CM | POA: Diagnosis not present

## 2019-12-10 ENCOUNTER — Ambulatory Visit (INDEPENDENT_AMBULATORY_CARE_PROVIDER_SITE_OTHER): Payer: BC Managed Care – PPO | Admitting: Otolaryngology

## 2019-12-11 ENCOUNTER — Ambulatory Visit (INDEPENDENT_AMBULATORY_CARE_PROVIDER_SITE_OTHER): Payer: BC Managed Care – PPO | Admitting: Otolaryngology

## 2019-12-11 DIAGNOSIS — F4310 Post-traumatic stress disorder, unspecified: Secondary | ICD-10-CM | POA: Diagnosis not present

## 2019-12-11 DIAGNOSIS — F411 Generalized anxiety disorder: Secondary | ICD-10-CM | POA: Diagnosis not present

## 2019-12-11 DIAGNOSIS — F331 Major depressive disorder, recurrent, moderate: Secondary | ICD-10-CM | POA: Diagnosis not present

## 2019-12-18 DIAGNOSIS — F331 Major depressive disorder, recurrent, moderate: Secondary | ICD-10-CM | POA: Diagnosis not present

## 2019-12-18 DIAGNOSIS — F411 Generalized anxiety disorder: Secondary | ICD-10-CM | POA: Diagnosis not present

## 2019-12-18 DIAGNOSIS — F4310 Post-traumatic stress disorder, unspecified: Secondary | ICD-10-CM | POA: Diagnosis not present

## 2019-12-20 DIAGNOSIS — F4310 Post-traumatic stress disorder, unspecified: Secondary | ICD-10-CM | POA: Diagnosis not present

## 2019-12-20 DIAGNOSIS — F411 Generalized anxiety disorder: Secondary | ICD-10-CM | POA: Diagnosis not present

## 2019-12-20 DIAGNOSIS — F331 Major depressive disorder, recurrent, moderate: Secondary | ICD-10-CM | POA: Diagnosis not present

## 2020-01-09 DIAGNOSIS — F411 Generalized anxiety disorder: Secondary | ICD-10-CM | POA: Diagnosis not present

## 2020-01-09 DIAGNOSIS — F4310 Post-traumatic stress disorder, unspecified: Secondary | ICD-10-CM | POA: Diagnosis not present

## 2020-01-09 DIAGNOSIS — F331 Major depressive disorder, recurrent, moderate: Secondary | ICD-10-CM | POA: Diagnosis not present

## 2020-01-17 ENCOUNTER — Encounter: Payer: Self-pay | Admitting: Emergency Medicine

## 2020-01-17 ENCOUNTER — Other Ambulatory Visit: Payer: Self-pay

## 2020-01-17 ENCOUNTER — Ambulatory Visit (INDEPENDENT_AMBULATORY_CARE_PROVIDER_SITE_OTHER): Payer: BC Managed Care – PPO | Admitting: Emergency Medicine

## 2020-01-17 ENCOUNTER — Ambulatory Visit: Payer: BC Managed Care – PPO | Admitting: Emergency Medicine

## 2020-01-17 VITALS — BP 114/70 | HR 70 | Temp 97.7°F | Resp 16 | Ht 66.0 in | Wt 205.0 lb

## 2020-01-17 DIAGNOSIS — Z8719 Personal history of other diseases of the digestive system: Secondary | ICD-10-CM

## 2020-01-17 DIAGNOSIS — K625 Hemorrhage of anus and rectum: Secondary | ICD-10-CM | POA: Diagnosis not present

## 2020-01-17 LAB — IFOBT (OCCULT BLOOD): IFOBT: NEGATIVE

## 2020-01-17 LAB — POCT CBC
Granulocyte percent: 55.4 %G (ref 37–80)
HCT, POC: 42 % — AB (ref 29–41)
Hemoglobin: 13.1 g/dL (ref 11–14.6)
Lymph, poc: 2.1 (ref 0.6–3.4)
MCH, POC: 23.8 pg — AB (ref 27–31.2)
MCHC: 31.1 g/dL — AB (ref 31.8–35.4)
MCV: 76.4 fL (ref 76–111)
MID (cbc): 0.8 (ref 0–0.9)
MPV: 7.8 fL (ref 0–99.8)
POC Granulocyte: 6.7 (ref 2–6.9)
POC LYMPH PERCENT: 12.6 %L (ref 10–50)
POC MID %: 12.6 %M — AB (ref 0–12)
Platelet Count, POC: 207 10*3/uL (ref 142–424)
RBC: 5.5 M/uL — AB (ref 4.04–5.48)
RDW, POC: 15 %
WBC: 6.7 10*3/uL (ref 4.6–10.2)

## 2020-01-17 NOTE — Progress Notes (Signed)
[name] 59 y.o.   Chief Complaint  Patient presents with  . Rectal Bleeding    2 days    HISTORY OF PRESENT ILLNESS: This is a 59 y.o. female complaining of rectal bleeding for the past 2 days with every bowel movement. Has a history of diverticulosis and hemorrhoids. Able to eat and drink.  Denies nausea or vomiting.  Denies abdominal pain.  Denies fever or chills. Not on blood thinners. Denies syncopal episode, lightheadedness, dizziness or visual disturbances. Denies difficulty breathing or chest pain. No other significant symptoms.  No other complaints or medical concerns today.  HPI   Prior to Admission medications   Medication Sig Start Date End Date Taking? Authorizing Provider  Ascorbic Acid (VITAMIN C) 1000 MG tablet Take 2,000 mg by mouth at bedtime.   Yes [provider]  Calcium Carb-Cholecalciferol (OYSTER SHELL CALCIUM 250+D PO) Take 2 tablets by mouth at bedtime.   Yes [provider]  Cholecalciferol (VITAMIN D) 50 MCG (2000 UT) tablet Take 4,000 Units by mouth at bedtime.    Yes [provider]  loratadine (CLARITIN) 10 MG tablet Take 10 mg by mouth daily as needed for allergies or rhinitis.   Yes [provider]  losartan (COZAAR) 50 MG tablet Take 25 mg by mouth 2 (two) times daily.    Yes [provider]  metoprolol succinate (TOPROL-XL) 25 MG 24 hr tablet Take 25 mg by mouth 2 (two) times daily.    Yes [provider]  Multiple Vitamins-Minerals (ZINC PO) Take 1 tablet by mouth at bedtime.   Yes [provider]  OVER THE COUNTER MEDICATION Take 1 capsule by mouth daily. Soothe and Heal for joint pain   Yes [provider]  PARoxetine (PAXIL CR) 25 MG 24 hr tablet Take 25 mg by mouth daily.   Yes [provider]  Polyethyl Glycol-Propyl Glycol (SYSTANE OP) Place 2 drops into both eyes daily.   Yes [provider]  potassium chloride SA (KLOR-CON) 20 MEQ tablet  Take 1 tablet (20 mEq total) by mouth 3 (three) times daily. Patient taking differently: Take 20 mEq by mouth 2 (two) times daily.  08/24/19  Yes Rutherford Guys, MD  traZODone (DESYREL) 50 MG tablet Take 50 mg by mouth at bedtime as needed for sleep.   Yes [provider]  TURMERIC PO Take 1 capsule by mouth daily as needed (pain/inflammation).    Yes [provider]  aspirin EC 81 MG EC tablet Take 1 tablet (81 mg total) by mouth daily. Patient not taking: Reported on 01/17/2020 10/21/19   Dixie Dials, MD  fluticasone [facility]) 50 MCG/ACT nasal spray Place 1 spray into both nostrils at bedtime as needed for allergies or rhinitis.    [provider]  nitroGLYCERIN (NITROSTAT) 0.4 MG SL tablet Place 1 tablet (0.4 mg total) under the tongue every 5 (five) minutes x 3 doses as needed for chest pain. Patient not taking: Reported on 01/17/2020 10/20/19   Dixie Dials, MD    Allergies  Allergen Reactions  . Ceftriaxone Sodium Itching  . Doxycycline Rash    Patient Active Problem List   Diagnosis Date Noted  . Stress incontinence of urine 10/31/2018  . History of diverticulosis 05/04/2017  . History of PSVT (paroxysmal supraventricular tachycardia) 06/24/2012  . Multinodular goiter 05/12/2012  . SLEEP APNEA 01/19/2008  . IRRITABLE BOWEL SYNDROME 01/18/2008    Past Medical History:  Diagnosis Date  . Anemia   . Anxiety   .  Colon polyp 08/28/2012   Tubular adenoma  . Dense breast   . Depression   . Genital warts   . GERD (gastroesophageal reflux disease)   . Heart murmur   . Hemorrhoids   . Hx of cardiac catheterization 2009   normal coronary arteries  . Hx of menorrhagia    Novasure  . Hypokalemia   . Hypomagnesemia   . Hypophosphatemia   . Lactose intolerance in adult   . Mitral valve prolapse   . Normal cardiac stress test 03/09/2012   no ischemia  . Positive PPD   . Rapid heart beat    States "irreg heart beat"  . Thyroid disease    Thyroid  nodules    Past Surgical History:  Procedure Laterality Date  . CARDIAC CATHETERIZATION     x2 with normal results per pt  . NOVASURE ABLATION    . UPPER GASTROINTESTINAL ENDOSCOPY  May 2013    Social History   Socioeconomic History  . Marital status: Divorced    Spouse name: Not on file  . Number of children: 2  . Years of education: 15  . Highest education level: Not on file  Occupational History  . Occupation: dialysis Engineer, production: [employer]  Tobacco Use  . Smoking status: Never Smoker  . Smokeless tobacco: Never Used  Substance and Sexual Activity  . Alcohol use: No  . Drug use: No  . Sexual activity: Yes  Other Topics Concern  . 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 She lived in [place] and in [place] for a number of years her mom is from [place]. 16 years of education went to college at [employer] lives at home with her son who is in his 29s no pets   Neg ets tob etoh hx PA   6 hours of sleep   G2P2    TD2010  colonoscopy 2009   Now running  own business    HH of 2    Left handed    Caffeine use: tea sometimes         Social Determinants of Health   Financial Resource Strain:   . Difficulty of Paying Living Expenses:   Food Insecurity:   . Worried About Charity fundraiser in the Last Year:   . Arboriculturist in the Last Year:   Transportation Needs:   . Film/video editor (Medical):   [name] Lack of Transportation (Non-Medical):   Physical Activity:   . Days of Exercise per Week:   . Minutes of Exercise per Session:   Stress:   . Feeling of Stress :   Social Connections:   . Frequency of Communication with Friends and Family:   . Frequency of Social Gatherings with Friends and Family:   . Attends Religious Services:   . Active Member of Clubs or Organizations:   .  Attends Archivist Meetings:   [name] Marital Status:   Intimate Partner Violence:   . Fear of Current or Ex-Partner:   . Emotionally Abused:   [name] Physically Abused:   . Sexually Abused:     Family History  Problem Relation Age of Onset  . Heart disease Father        died Mi 69  . Hypertension Maternal Grandfather   .  Diabetes Maternal Grandfather   . Heart disease Maternal Grandfather   . Colon cancer Maternal Grandfather   . Rectal cancer Maternal Grandfather   . Diabetes Maternal Grandmother   . Prostate cancer Neg Hx   . Breast cancer Neg Hx      Review of Systems  Constitutional: Negative.  Negative for chills and fever.  HENT: Negative.  Negative for congestion and sore throat.   Respiratory: Negative.  Negative for cough and shortness of breath.   Cardiovascular: Negative for chest pain and palpitations.  Gastrointestinal: Positive for blood in stool. Negative for abdominal pain, diarrhea, melena, nausea and vomiting.  Genitourinary: Negative.  Negative for dysuria and hematuria.  Musculoskeletal: Negative.  Negative for back pain, myalgias and neck pain.  Skin: Negative.  Negative for rash.  Neurological: Negative.  Negative for dizziness and headaches.  All other systems reviewed and are negative.    Physical Exam Vitals reviewed.  Constitutional:      Appearance: Normal appearance.  HENT:     Head: Normocephalic.  Eyes:     Extraocular Movements: Extraocular movements intact.     Conjunctiva/sclera: Conjunctivae normal.     Pupils: Pupils are equal, round, and reactive to light.  Cardiovascular:     Rate and Rhythm: Normal rate and regular rhythm.     Heart sounds: Normal heart sounds.  Pulmonary:     Effort: Pulmonary effort is normal.     Breath sounds: Normal breath sounds.  Abdominal:     General: There is no distension.     Palpations: Abdomen is soft.     Tenderness: There is no abdominal tenderness.  Genitourinary:    Rectum: Guaiac  result negative. External hemorrhoid (No thromboses and no bleeding) present. No mass, tenderness or anal fissure. Normal anal tone.  Musculoskeletal:        General: Normal range of motion.     Cervical back: Normal range of motion and neck supple.  Skin:    General: Skin is warm and dry.     Capillary Refill: Capillary refill takes less than 2 seconds.  Neurological:     General: No focal deficit present.     Mental Status: She is alert and oriented to person, place, and time.  Psychiatric:        Mood and Affect: Mood normal.        Behavior: Behavior normal.    Results for orders placed or performed in visit on 01/17/20 (from the past 24 hour(s))  IFOBT POC (occult bld, rslt in office)     Status: None   Collection Time: 01/17/20  5:00 PM  Result Value Ref Range   IFOBT Negative   POCT CBC     Status: Abnormal   Collection Time: 01/17/20  5:08 PM  Result Value Ref Range   WBC 6.7 4.6 - 10.2 K/uL   Lymph, poc 2.1 0.6 - 3.4   POC LYMPH PERCENT 12.6 10 - 50 %L   MID (cbc) 0.8 0 - 0.9   POC MID % 12.6 (A) 0 - 12 %M   POC Granulocyte 6.7 2 - 6.9   Granulocyte percent 55.4 37 - 80 %G   RBC 5.50 (A) 4.04 - 5.48 M/uL   Hemoglobin 13.1 11 - 14.6 g/dL   HCT, POC 42.0 (A) 29 - 41 %   MCV 76.4 76 - 111 fL   MCH, POC 23.8 (A) 27 - 31.2 pg   MCHC 31.1 (A) 31.8 - 35.4 g/dL  RDW, POC 15.0 %   Platelet Count, POC 207 142 - 424 K/uL   MPV 7.8 0 - 99.8 fL   A total of 30 minutes was spent with the patient, greater than 50% of which was in counseling/coordination of care regarding differential diagnosis of rectal bleeding, treatment for diverticulosis and hemorrhoids, diet and nutrition regarding diverticulosis, importance of fiber supplementation, ED precautions, review of most recent office visit notes, review of most recent blood work results including today's CBC, prognosis and need for follow-up.   ASSESSMENT & PLAN: Monik was seen today for rectal bleeding.  Diagnoses and all  orders for this visit:  Rectal bleeding -     IFOBT POC (occult bld, rslt in office)  History of diverticulosis -     POCT CBC  History of hemorrhoids    Patient Instructions       If you have lab work done today you will be contacted with your lab results within the next 2 weeks.  If you have not heard from [place] then please contact us. The fastest way to get your results is to register for My Chart.   IF you received an x-ray today, you will receive an invoice from [facility]. Please contact [facility] at [phone] with questions or concerns regarding your invoice.   IF you received labwork today, you will receive an invoice from [place]. Please contact [facility] at [phone] with questions or concerns regarding your invoice.   Our billing staff will not be able to assist you with questions regarding bills from these companies.  You will be contacted with the lab results as soon as they are available. The fastest way to get your results is to activate your My Chart account. Instructions are located on the last page of this paperwork. If you have not heard from [place] regarding the results in 2 weeks, please contact this office.     Diverticulosis  Diverticulosis is a condition that develops when small pouches (diverticula) form in the wall of the large intestine (colon). The colon is where water is absorbed and stool (feces) is formed. The pouches form when the inside layer of the colon pushes through weak spots in the outer layers of the colon. You may have a few pouches or many of them. The pouches usually do not cause problems unless they become inflamed or infected. When this happens, the condition is called diverticulitis. What are the causes? The cause of this condition is not known. What increases the risk? The following factors may make you more likely to develop this condition:  Being older than age 49. Your risk for this condition increases with  age. Diverticulosis is rare among people younger than age 17. By age 58, many people have it.  Eating a low-fiber diet.  Having frequent constipation.  Being overweight.  Not getting enough exercise.  Smoking.  Taking over-the-counter pain medicines, like aspirin and ibuprofen.  Having a family history of diverticulosis. What are the signs or symptoms? In most people, there are no symptoms of this condition. If you do have symptoms, they may include:  Bloating.  Cramps in the abdomen.  Constipation or diarrhea.  Pain in the lower left side of the abdomen. How is this diagnosed? Because diverticulosis usually has no symptoms, it is most often diagnosed during an exam for other colon problems. The condition may be diagnosed by:  Using a flexible scope to examine the colon (colonoscopy).  Taking an X-ray of the colon after dye  has been put into the colon (barium enema).  Having a CT scan. How is this treated? You may not need treatment for this condition. Your health care provider may recommend treatment to prevent problems. You may need treatment if you have symptoms or if you previously had diverticulitis. Treatment may include:  Eating a high-fiber diet.  Taking a fiber supplement.  Taking a live bacteria supplement (probiotic).  Taking medicine to relax your colon. Follow these instructions at home: Medicines  Take over-the-counter and prescription medicines only as told by your health care provider.  If told by your health care provider, take a fiber supplement or probiotic. Constipation prevention Your condition may cause constipation. To prevent or treat constipation, you may need to:  Drink enough fluid to keep your urine pale yellow.  Take over-the-counter or prescription medicines.  Eat foods that are high in fiber, such as beans, whole grains, and fresh fruits and vegetables.  Limit foods that are high in fat and processed sugars, such as fried or  sweet foods.  General instructions  Try not to strain when you have a bowel movement.  Keep all follow-up visits as told by your health care provider. This is important. Contact a health care provider if you:  Have pain in your abdomen.  Have bloating.  Have cramps.  Have not had a bowel movement in 3 days. Get help right away if:  Your pain gets worse.  Your bloating becomes very bad.  You have a fever or chills, and your symptoms suddenly get worse.  You vomit.  You have bowel movements that are bloody or black.  You have bleeding from your rectum. Summary  Diverticulosis is a condition that develops when small pouches (diverticula) form in the wall of the large intestine (colon).  You may have a few pouches or many of them.  This condition is most often diagnosed during an exam for other colon problems.  Treatment may include increasing the fiber in your diet, taking supplements, or taking medicines. This information is not intended to replace advice given to you by your health care provider. Make sure you discuss any questions you have with your health care provider. Document Revised: 03/29/2019 Document Reviewed: 03/29/2019 Elsevier Patient Education  [place].  Rectal Bleeding  Rectal bleeding is when blood comes out of the opening of the butt (anus). People with this kind of bleeding may notice bright red blood in their underwear or in the toilet after they poop (have a bowel movement). They may also have dark red or black poop (stool). Rectal bleeding is often a sign that something is wrong. It needs to be checked by a doctor. Follow these instructions at home: Watch for any changes in your condition. Take these actions to help with bleeding and discomfort:  Eat a diet that is high in fiber. This will keep your poop soft so it is easier for you to poop without pushing too hard. Ask your doctor to tell you what foods and drinks are high in  fiber.  Drink enough fluid to keep your pee (urine) clear or pale yellow. This also helps keep your poop soft.  Try taking a warm bath. This may help with pain.  Keep all follow-up visits as told by your doctor. This is important. Get help right away if:  You have new bleeding.  You have more bleeding than before.  You have black or dark red poop.  You throw up (vomit) blood or something that  looks like coffee grounds.  You have pain or tenderness in your belly (abdomen).  You have a fever.  You feel weak.  You feel sick to your stomach (nauseous).  You pass out (faint).  You have very bad pain in your butt.  You cannot poop. This information is not intended to replace advice given to you by your health care provider. Make sure you discuss any questions you have with your health care provider. Document Revised: 08/12/2017 Document Reviewed: 10/26/2015 Elsevier Patient Education  2020 Elsevier Inc.      Agustina Caroli, MD [facility]

## 2020-01-17 NOTE — Patient Instructions (Addendum)
If you have lab work done today you will be contacted with your lab results within the next 2 weeks.  If you have not heard from [place] then please contact us. The fastest way to get your results is to register for My Chart.   IF you received an x-ray today, you will receive an invoice from [facility]. Please contact [facility] at [phone] with questions or concerns regarding your invoice.   IF you received labwork today, you will receive an invoice from [place]. Please contact [facility] at [phone] with questions or concerns regarding your invoice.   Our billing staff will not be able to assist you with questions regarding bills from these companies.  You will be contacted with the lab results as soon as they are available. The fastest way to get your results is to activate your My Chart account. Instructions are located on the last page of this paperwork. If you have not heard from [place] regarding the results in 2 weeks, please contact this office.     Diverticulosis  Diverticulosis is a condition that develops when small pouches (diverticula) form in the wall of the large intestine (colon). The colon is where water is absorbed and stool (feces) is formed. The pouches form when the inside layer of the colon pushes through weak spots in the outer layers of the colon. You may have a few pouches or many of them. The pouches usually do not cause problems unless they become inflamed or infected. When this happens, the condition is called diverticulitis. What are the causes? The cause of this condition is not known. What increases the risk? The following factors may make you more likely to develop this condition:  Being older than age 30. Your risk for this condition increases with age. Diverticulosis is rare among people younger than age 50. By age 67, many people have it.  Eating a low-fiber diet.  Having frequent constipation.  Being overweight.  Not getting  enough exercise.  Smoking.  Taking over-the-counter pain medicines, like aspirin and ibuprofen.  Having a family history of diverticulosis. What are the signs or symptoms? In most people, there are no symptoms of this condition. If you do have symptoms, they may include:  Bloating.  Cramps in the abdomen.  Constipation or diarrhea.  Pain in the lower left side of the abdomen. How is this diagnosed? Because diverticulosis usually has no symptoms, it is most often diagnosed during an exam for other colon problems. The condition may be diagnosed by:  Using a flexible scope to examine the colon (colonoscopy).  Taking an X-ray of the colon after dye has been put into the colon (barium enema).  Having a CT scan. How is this treated? You may not need treatment for this condition. Your health care provider may recommend treatment to prevent problems. You may need treatment if you have symptoms or if you previously had diverticulitis. Treatment may include:  Eating a high-fiber diet.  Taking a fiber supplement.  Taking a live bacteria supplement (probiotic).  Taking medicine to relax your colon. Follow these instructions at home: Medicines  Take over-the-counter and prescription medicines only as told by your health care provider.  If told by your health care provider, take a fiber supplement or probiotic. Constipation prevention Your condition may cause constipation. To prevent or treat constipation, you may need to:  Drink enough fluid to keep your urine pale yellow.  Take over-the-counter or prescription medicines.  Eat foods that are  high in fiber, such as beans, whole grains, and fresh fruits and vegetables.  Limit foods that are high in fat and processed sugars, such as fried or sweet foods.  General instructions  Try not to strain when you have a bowel movement.  Keep all follow-up visits as told by your health care provider. This is important. Contact a health  care provider if you:  Have pain in your abdomen.  Have bloating.  Have cramps.  Have not had a bowel movement in 3 days. Get help right away if:  Your pain gets worse.  Your bloating becomes very bad.  You have a fever or chills, and your symptoms suddenly get worse.  You vomit.  You have bowel movements that are bloody or black.  You have bleeding from your rectum. Summary  Diverticulosis is a condition that develops when small pouches (diverticula) form in the wall of the large intestine (colon).  You may have a few pouches or many of them.  This condition is most often diagnosed during an exam for other colon problems.  Treatment may include increasing the fiber in your diet, taking supplements, or taking medicines. This information is not intended to replace advice given to you by your health care provider. Make sure you discuss any questions you have with your health care provider. Document Revised: 03/29/2019 Document Reviewed: 03/29/2019 Elsevier Patient Education  [place].  Rectal Bleeding  Rectal bleeding is when blood comes out of the opening of the butt (anus). People with this kind of bleeding may notice bright red blood in their underwear or in the toilet after they poop (have a bowel movement). They may also have dark red or black poop (stool). Rectal bleeding is often a sign that something is wrong. It needs to be checked by a doctor. Follow these instructions at home: Watch for any changes in your condition. Take these actions to help with bleeding and discomfort:  Eat a diet that is high in fiber. This will keep your poop soft so it is easier for you to poop without pushing too hard. Ask your doctor to tell you what foods and drinks are high in fiber.  Drink enough fluid to keep your pee (urine) clear or pale yellow. This also helps keep your poop soft.  Try taking a warm bath. This may help with pain.  Keep all follow-up visits as told by  your doctor. This is important. Get help right away if:  You have new bleeding.  You have more bleeding than before.  You have black or dark red poop.  You throw up (vomit) blood or something that looks like coffee grounds.  You have pain or tenderness in your belly (abdomen).  You have a fever.  You feel weak.  You feel sick to your stomach (nauseous).  You pass out (faint).  You have very bad pain in your butt.  You cannot poop. This information is not intended to replace advice given to you by your health care provider. Make sure you discuss any questions you have with your health care provider. Document Revised: 08/12/2017 Document Reviewed: 10/26/2015 Elsevier Patient Education  2020 [employer].

## 2020-01-24 ENCOUNTER — Other Ambulatory Visit: Payer: Self-pay | Admitting: Orthopedic Surgery

## 2020-01-24 DIAGNOSIS — M25561 Pain in right knee: Secondary | ICD-10-CM

## 2020-01-25 ENCOUNTER — Other Ambulatory Visit: Payer: BC Managed Care – PPO

## 2020-01-25 ENCOUNTER — Other Ambulatory Visit: Payer: Self-pay

## 2020-01-25 ENCOUNTER — Ambulatory Visit
Admission: RE | Admit: 2020-01-25 | Discharge: 2020-01-25 | Disposition: A | Discharge status: Home / Self Care | Payer: BC Managed Care – PPO | Source: Ambulatory Visit | Attending: Orthopedic Surgery | Admitting: Orthopedic Surgery

## 2020-01-25 DIAGNOSIS — M25561 Pain in right knee: Secondary | ICD-10-CM | POA: Diagnosis not present

## 2020-01-28 DIAGNOSIS — M25561 Pain in right knee: Secondary | ICD-10-CM | POA: Diagnosis not present

## 2020-01-29 DIAGNOSIS — F4323 Adjustment disorder with mixed anxiety and depressed mood: Secondary | ICD-10-CM | POA: Diagnosis not present

## 2020-01-29 DIAGNOSIS — F331 Major depressive disorder, recurrent, moderate: Secondary | ICD-10-CM | POA: Diagnosis not present

## 2020-01-29 DIAGNOSIS — F411 Generalized anxiety disorder: Secondary | ICD-10-CM | POA: Diagnosis not present

## 2020-01-29 DIAGNOSIS — F4310 Post-traumatic stress disorder, unspecified: Secondary | ICD-10-CM | POA: Diagnosis not present

## 2020-02-07 DIAGNOSIS — Z20828 Contact with and (suspected) exposure to other viral communicable diseases: Secondary | ICD-10-CM | POA: Diagnosis not present

## 2020-02-10 ENCOUNTER — Ambulatory Visit (HOSPITAL_COMMUNITY)
Admission: EM | Admit: 2020-02-10 | Discharge: 2020-02-10 | Disposition: A | Discharge status: Home / Self Care | Payer: BC Managed Care – PPO | Attending: Family Medicine | Admitting: Family Medicine

## 2020-02-10 ENCOUNTER — Encounter (HOSPITAL_COMMUNITY): Payer: Self-pay

## 2020-02-10 ENCOUNTER — Other Ambulatory Visit: Payer: Self-pay

## 2020-02-10 DIAGNOSIS — J069 Acute upper respiratory infection, unspecified: Secondary | ICD-10-CM

## 2020-02-10 DIAGNOSIS — R059 Cough, unspecified: Secondary | ICD-10-CM

## 2020-02-10 DIAGNOSIS — R05 Cough: Secondary | ICD-10-CM | POA: Diagnosis not present

## 2020-02-10 DIAGNOSIS — R6883 Chills (without fever): Secondary | ICD-10-CM

## 2020-02-10 DIAGNOSIS — R519 Headache, unspecified: Secondary | ICD-10-CM

## 2020-02-10 MED ORDER — BENZONATATE 100 MG PO CAPS: 100.0000 mg | ORAL_CAPSULE | Freq: Three times a day (TID) | ORAL | 0 refills | Status: DC

## 2020-02-10 MED ORDER — AZITHROMYCIN 250 MG PO TABS: ORAL_TABLET | ORAL | 0 refills | Status: DC

## 2020-02-10 NOTE — ED Provider Notes (Signed)
[facility]   [id number] 02/10/20 Arrival Time: [id number]  [id number] THROAT  SUBJECTIVE: History from: patient.  [name] is a 59 y.o. female who presents with abrupt onset of nasal congestion, headache, sore throat and cough for the last 10 days. Reports that her granddaughter is sick and that she has been around her. She is unsure of the granddaughter's diagnosis. Has tried hot tea, mucinex DM, and other OTC meds without relief. There are no aggravating symptoms. Has not received Covid vaccines. Reports that she had a Covid test x 4 days ago that was negative. Denies fever, chills, fatigue, ear pain, sinus pain, rhinorrhea, nasal congestion, cough, SOB, wheezing, chest pain, nausea, rash, changes in bowel or bladder habits.     ROS: As per HPI.  All other pertinent ROS negative.     Past Medical History:  Diagnosis Date  . Anemia   . Anxiety   . Colon polyp 08/28/2012   Tubular adenoma  . Dense breast   . Depression   . Genital warts   . GERD (gastroesophageal reflux disease)   . Heart murmur   . Hemorrhoids   . Hx of cardiac catheterization 2009   normal coronary arteries  . Hx of menorrhagia    Novasure  . Hypokalemia   . Hypomagnesemia   . Hypophosphatemia   . Lactose intolerance in adult   . Mitral valve prolapse   . Normal cardiac stress test 03/09/2012   no ischemia  . Positive PPD   . Rapid heart beat    States "irreg heart beat"  . Thyroid disease    Thyroid nodules   Past Surgical History:  Procedure Laterality Date  . CARDIAC CATHETERIZATION     x2 with normal results per pt  . NOVASURE ABLATION    . UPPER GASTROINTESTINAL ENDOSCOPY  May 2013   Allergies  Allergen Reactions  . Ceftriaxone Sodium Itching  . Doxycycline Rash   No current facility-administered medications on file prior to encounter.   Current Outpatient Medications on File Prior to Encounter  Medication Sig Dispense Refill  . Ascorbic Acid (VITAMIN C) 1000 MG  tablet Take 2,000 mg by mouth at bedtime.    [name] aspirin EC 81 MG EC tablet Take 1 tablet (81 mg total) by mouth daily. (Patient not taking: Reported on 01/17/2020)    . Calcium Carb-Cholecalciferol (OYSTER SHELL CALCIUM 250+D PO) Take 2 tablets by mouth at bedtime.    . Cholecalciferol (VITAMIN D) 50 MCG (2000 UT) tablet Take 4,000 Units by mouth at bedtime.     . fluticasone (FLONASE) 50 MCG/ACT nasal spray Place 1 spray into both nostrils at bedtime as needed for allergies or rhinitis.    [name] loratadine (CLARITIN) 10 MG tablet Take 10 mg by mouth daily as needed for allergies or rhinitis.    [name] losartan (COZAAR) 50 MG tablet Take 25 mg by mouth 2 (two) times daily.     . metoprolol succinate (TOPROL-XL) 25 MG 24 hr tablet Take 25 mg by mouth 2 (two) times daily.     . Multiple Vitamins-Minerals (ZINC PO) Take 1 tablet by mouth at bedtime.    . nitroGLYCERIN (NITROSTAT) 0.4 MG SL tablet Place 1 tablet (0.4 mg total) under the tongue every 5 (five) minutes x 3 doses as needed for chest pain. (Patient not taking: Reported on 01/17/2020) 25 tablet 0  . OVER THE COUNTER MEDICATION Take 1 capsule by mouth daily. Soothe and Heal for joint pain    .  PARoxetine (PAXIL CR) 25 MG 24 hr tablet Take 25 mg by mouth daily.    Vladimir Faster Glycol-Propyl Glycol (SYSTANE OP) Place 2 drops into both eyes daily.    . potassium chloride SA (KLOR-CON) 20 MEQ tablet Take 1 tablet (20 mEq total) by mouth 3 (three) times daily. (Patient taking differently: Take 20 mEq by mouth 2 (two) times daily. ) 90 tablet 0  . traZODone (DESYREL) 50 MG tablet Take 50 mg by mouth at bedtime as needed for sleep.    . TURMERIC PO Take 1 capsule by mouth daily as needed (pain/inflammation).      Social History   Socioeconomic History  . Marital status: Divorced    Spouse name: Not on file  . Number of children: 2  . Years of education: 53  . Highest education level: Not on file  Occupational History  . Occupation: dialysis Hospital doctor: [employer]  Tobacco Use  . Smoking status: Never Smoker  . Smokeless tobacco: Never Used  Substance and Sexual Activity  . Alcohol use: No  . Drug use: No  . Sexual activity: Yes  Other Topics Concern  . Not on file  Social History Narrative   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 She lived in [place] and in [place] for a number of years her mom is from [place]. 16 years of education went to college at [employer] lives at home with her son who is in his 35s no pets   Neg ets tob etoh hx PA   6 hours of sleep   G2P2    TD2010  colonoscopy 2009   Now running  own business    HH of 2    Left handed    Caffeine use: tea sometimes         Social Determinants of Health   Financial Resource Strain:   . Difficulty of Paying Living Expenses:   Food Insecurity:   . Worried About Charity fundraiser in the Last Year:   . Arboriculturist in the Last Year:   Transportation Needs:   . Film/video editor (Medical):   [name] Lack of Transportation (Non-Medical):   Physical Activity:   . Days of Exercise per Week:   . Minutes of Exercise per Session:   Stress:   . Feeling of Stress :   Social Connections:   . Frequency of Communication with Friends and Family:   . Frequency of Social Gatherings with Friends and Family:   . Attends Religious Services:   . Active Member of Clubs or Organizations:   . Attends Archivist Meetings:   [name] Marital Status:   Intimate Partner Violence:   . Fear of Current or Ex-Partner:   . Emotionally Abused:   [name] Physically Abused:   . Sexually Abused:    Family History  Problem Relation Age of Onset  . Heart disease Father        died Mi 45  . Hypertension Maternal Grandfather   . Diabetes Maternal Grandfather   . Heart disease Maternal Grandfather   . Colon cancer Maternal Grandfather   .  Rectal cancer Maternal Grandfather   . Diabetes Maternal Grandmother   . Prostate cancer Neg Hx   . Breast cancer Neg Hx     OBJECTIVE:  Vitals:   02/10/20 1549  BP: 123/76  Pulse: 75  Resp: 20  Temp: 98.4 F (36.9 C)  SpO2: 100%     General appearance: alert; appears fatigued, but nontoxic, speaking in full sentences and managing own secretions HEENT: NCAT; Ears: EACs clear, TMs pearly gray with visible cone of light, without erythema; Eyes: PERRL, EOMI grossly; Nose: no obvious rhinorrhea; Throat: oropharynx clear, tonsils 1+ and mildly erythematous without white tonsillar exudates, uvula midline; sinuses tender to palpation Neck: supple without LAD Lungs: CTA bilaterally without adventitious breath sounds; dry cough present Heart: regular rate and rhythm.  Radial pulses 2+ symmetrical bilaterally Skin: warm and dry Psychological: alert and cooperative; normal mood and affect  LABS: No results found for this or any previous visit (from the past 24 hour(s)).   ASSESSMENT & PLAN:  1. Cough   2. Chills   3. Nonintractable headache, unspecified chronicity pattern, unspecified headache type   4. Acute upper respiratory infection     Meds ordered this encounter  Medications  . azithromycin (ZITHROMAX Z-PAK) 250 MG tablet    Sig: Take 2 tablets today, and 1 tablet for the next 4 days.    Dispense:  6 tablet    Refill:  0    Order Specific Question:   Supervising Provider    Answer:   Chase Picket [id number]  . benzonatate (TESSALON) 100 MG capsule    Sig: Take 1 capsule (100 mg total) by mouth every 8 (eight) hours.    Dispense:  21 capsule    Refill:  0    Order Specific Question:   Supervising Provider    Answer:   Chase Picket [id number]    Upper respiratory Infection Prescribed z-pack Prescribed tessalon perles Push fluids and get rest Drink warm or cool liquids, use throat lozenges, or popsicles to help alleviate symptoms Take OTC ibuprofen or tylenol  as needed for pain Follow up with PCP if symptoms persist Return or go to ER if you have any new or worsening symptoms such as fever, chills, nausea, vomiting, worsening sore throat, cough, abdominal pain, chest pain, changes in bowel or bladder habits.   Reviewed expectations re: course of current medical issues. Questions answered. Outlined signs and symptoms indicating need for more acute intervention. Patient verbalized understanding. After Visit Summary given.           Faustino Congress, NP 02/10/20 1624

## 2020-02-10 NOTE — ED Triage Notes (Signed)
Pt presents with cough and sore throat for over a week, voice is hoarse and has had small amount of blood in sputum. Pt tested for covid on Thursday and negative

## 2020-02-10 NOTE — Discharge Instructions (Addendum)
You have an upper respiratory infection  I have sent in a z-pack for you to take   I have sent in [place] for you to take as well  If you are not feeling better over the next 2-3 days, follow up with this office or with primary care

## 2020-02-25 ENCOUNTER — Other Ambulatory Visit: Payer: Self-pay | Admitting: Family Medicine

## 2020-02-25 DIAGNOSIS — E876 Hypokalemia: Secondary | ICD-10-CM

## 2020-03-03 ENCOUNTER — Other Ambulatory Visit: Payer: Self-pay

## 2020-03-03 ENCOUNTER — Encounter: Payer: Self-pay | Admitting: Emergency Medicine

## 2020-03-03 ENCOUNTER — Telehealth (INDEPENDENT_AMBULATORY_CARE_PROVIDER_SITE_OTHER): Payer: BC Managed Care – PPO | Admitting: Emergency Medicine

## 2020-03-03 VITALS — Wt 206.0 lb

## 2020-03-03 DIAGNOSIS — J029 Acute pharyngitis, unspecified: Secondary | ICD-10-CM | POA: Diagnosis not present

## 2020-03-03 DIAGNOSIS — R6889 Other general symptoms and signs: Secondary | ICD-10-CM | POA: Diagnosis not present

## 2020-03-03 DIAGNOSIS — J040 Acute laryngitis: Secondary | ICD-10-CM | POA: Diagnosis not present

## 2020-03-03 MED ORDER — AZITHROMYCIN 250 MG PO TABS: ORAL_TABLET | ORAL | 0 refills | Status: DC

## 2020-03-03 MED ORDER — PREDNISONE 20 MG PO TABS: 20.0000 mg | ORAL_TABLET | Freq: Every day | ORAL | 0 refills | Status: AC

## 2020-03-03 NOTE — Progress Notes (Signed)
Telemedicine Encounter- SOAP NOTE Established Patient MyChart video conference attempted without success This telephone encounter was conducted with the patient's (or proxy's) verbal consent via audio telecommunications: yes/no: Yes Patient was instructed to have this encounter in a suitably private space; and to only have persons present to whom they give permission to participate. In addition, patient identity was confirmed by use of name plus two identifiers (DOB and address).  I discussed the limitations, risks, security and privacy concerns of performing an evaluation and management service by telephone and the availability of in person appointments. I also discussed with the patient that there may be a patient responsible charge related to this service. The patient expressed understanding and agreed to proceed.  I spent a total of TIME; 0 MIN TO 60 MIN: 15 minutes talking with the patient or their proxy.  Chief Complaint  Patient presents with  . Sore Throat    patient states she has a cold 3 weeks ago went to the urgent care and they told her that she had Swollen gland and Fluid in the ear. Patient still have a sore throat, cough , sore is raw that she is starting to see a little blood.  Patient uses Lemon/ Honey tea , and warm salt water but nothing seems to help at this time. Urgent care provided a Z pack but now its gone.    Subjective   [name] is a 59 y.o. female established patient. Telephone visit today complaining of flulike symptoms that started 3 weeks ago with sore throat and cough.  Was seen at urgent care center and started on Z-Pak.  Still has persistent hoarseness, sore throat and cough.  Coughed up a small amount of blood earlier today.  Able to eat and drink okay.  Denies nausea or vomiting.  Denies chest pain or difficulty breathing.  Denies abdominal pain or diarrhea.  Was tested for Covid and it was negative.  Has taken 1 shot of the Covid vaccine.   Denies any other significant associated symptoms.  HPI   Patient Active Problem List   Diagnosis Date Noted  . Stress incontinence of urine 10/31/2018  . History of diverticulosis 05/04/2017  . History of PSVT (paroxysmal supraventricular tachycardia) 06/24/2012  . Multinodular goiter 05/12/2012  . SLEEP APNEA 01/19/2008  . IRRITABLE BOWEL SYNDROME 01/18/2008    Past Medical History:  Diagnosis Date  . Anemia   . Anxiety   . Colon polyp 08/28/2012   Tubular adenoma  . Dense breast   . Depression   . Genital warts   . GERD (gastroesophageal reflux disease)   . Heart murmur   . Hemorrhoids   . Hx of cardiac catheterization 2009   normal coronary arteries  . Hx of menorrhagia    Novasure  . Hypokalemia   . Hypomagnesemia   . Hypophosphatemia   . Lactose intolerance in adult   . Mitral valve prolapse   . Normal cardiac stress test 03/09/2012   no ischemia  . Positive PPD   . Rapid heart beat    States "irreg heart beat"  . Thyroid disease    Thyroid nodules    Current Outpatient Medications  Medication Sig Dispense Refill  . Ascorbic Acid (VITAMIN C) 1000 MG tablet Take 2,000 mg by mouth at bedtime.    [name] aspirin EC 81 MG EC tablet Take 1 tablet (81 mg total) by mouth daily.    . Calcium Carb-Cholecalciferol (OYSTER SHELL CALCIUM 250+D PO) Take 2 tablets  by mouth at bedtime.    . Cholecalciferol (VITAMIN D) 50 MCG (2000 UT) tablet Take 4,000 Units by mouth at bedtime.     . fluticasone (FLONASE) 50 MCG/ACT nasal spray Place 1 spray into both nostrils at bedtime as needed for allergies or rhinitis.    [name] loratadine (CLARITIN) 10 MG tablet Take 10 mg by mouth daily as needed for allergies or rhinitis.    [name] losartan (COZAAR) 50 MG tablet Take 25 mg by mouth 2 (two) times daily.     . metoprolol succinate (TOPROL-XL) 25 MG 24 hr tablet Take 25 mg by mouth 2 (two) times daily.     . Multiple Vitamins-Minerals (ZINC PO) Take 1 tablet by mouth at bedtime.    [name] PARoxetine  (PAXIL CR) 25 MG 24 hr tablet Take 25 mg by mouth daily.    Vladimir Faster Glycol-Propyl Glycol (SYSTANE OP) Place 2 drops into both eyes daily.    . potassium chloride SA (KLOR-CON) 20 MEQ tablet TAKE 2 TABLETS(40 MEQ) BY MOUTH DAILY 180 tablet 0  . azithromycin (ZITHROMAX Z-PAK) 250 MG tablet Take 2 tablets today, and 1 tablet for the next 4 days. (Patient not taking: Reported on 03/03/2020) 6 tablet 0  . benzonatate (TESSALON) 100 MG capsule Take 1 capsule (100 mg total) by mouth every 8 (eight) hours. (Patient not taking: Reported on 03/03/2020) 21 capsule 0  . nitroGLYCERIN (NITROSTAT) 0.4 MG SL tablet Place 1 tablet (0.4 mg total) under the tongue every 5 (five) minutes x 3 doses as needed for chest pain. (Patient not taking: Reported on 01/17/2020) 25 tablet 0  . OVER THE COUNTER MEDICATION Take 1 capsule by mouth daily. Soothe and Heal for joint pain (Patient not taking: Reported on 03/03/2020)    . traZODone (DESYREL) 50 MG tablet Take 50 mg by mouth at bedtime as needed for sleep. (Patient not taking: Reported on 03/03/2020)    . TURMERIC PO Take 1 capsule by mouth daily as needed (pain/inflammation).  (Patient not taking: Reported on 03/03/2020)     No current facility-administered medications for this visit.    Allergies  Allergen Reactions  . Ceftriaxone Sodium Itching  . Doxycycline Rash    Social History   Socioeconomic History  . Marital status: Divorced    Spouse name: Not on file  . Number of children: 2  . Years of education: 54  . Highest education level: Not on file  Occupational History  . Occupation: dialysis Engineer, production: [employer]  Tobacco Use  . Smoking status: Never Smoker  . Smokeless tobacco: Never Used  Vaping Use  . Vaping Use: Never used  Substance and Sexual Activity  . Alcohol use: No  . Drug use: No  . Sexual activity: Yes  Other Topics Concern  . Not on file  Social History Narrative   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 She lived in [place] and in [place] for a number of years her mom is from [place]. 16 years of education went to college at [employer] lives at home with her son who is in his 69s no pets   Neg ets tob etoh hx PA   6 hours of sleep   G2P2    TD2010  colonoscopy 2009   Now running  own business    [facility] of 2    Left  handed    Caffeine use: tea sometimes         Social Determinants of Health   Financial Resource Strain:   . Difficulty of Paying Living Expenses:   Food Insecurity:   . Worried About Charity fundraiser in the Last Year:   . Arboriculturist in the Last Year:   Transportation Needs:   . Film/video editor (Medical):   [name] Lack of Transportation (Non-Medical):   Physical Activity:   . Days of Exercise per Week:   . Minutes of Exercise per Session:   Stress:   . Feeling of Stress :   Social Connections:   . Frequency of Communication with Friends and Family:   . Frequency of Social Gatherings with Friends and Family:   . Attends Religious Services:   . Active Member of Clubs or Organizations:   . Attends Archivist Meetings:   [name] Marital Status:   Intimate Partner Violence:   . Fear of Current or Ex-Partner:   . Emotionally Abused:   [name] Physically Abused:   . Sexually Abused:     Review of Systems  Constitutional: Negative.  Negative for chills and fever.  HENT: Positive for congestion and sore throat.   Respiratory: Positive for cough. Negative for sputum production, shortness of breath and wheezing.   Cardiovascular: Negative for chest pain and palpitations.  Gastrointestinal: Negative.  Negative for abdominal pain, diarrhea, nausea and vomiting.  Genitourinary: Negative.  Negative for dysuria and hematuria.  Musculoskeletal: Negative.  Negative for back pain, joint pain and myalgias.  Skin: Negative.  Negative for rash.    Neurological: Negative for dizziness and headaches.  All other systems reviewed and are negative.   Objective  Alert and oriented x3 in no apparent respiratory distress Vitals as reported by the patient: Today's Vitals   03/03/20 1042  Weight: 206 lb (93.4 kg)    There are no diagnoses linked to this encounter. Suetta was seen today for sore throat.  Diagnoses and all orders for this visit:  Acute laryngitis -     azithromycin (ZITHROMAX) 250 MG tablet; Sig as indicated -     predniSONE (DELTASONE) 20 MG tablet; Take 1 tablet (20 mg total) by mouth daily with breakfast for 5 days.  Flu-like symptoms  Sore throat -     azithromycin (ZITHROMAX) 250 MG tablet; Sig as indicated   Clinically stable.  No red flag signs or symptoms.  Advised to contact the office if no better or worse during the next several days.  I discussed the assessment and treatment plan with the patient. The patient was provided an opportunity to ask questions and all were answered. The patient agreed with the plan and demonstrated an understanding of the instructions.   The patient was advised to call back or seek an in-person evaluation if the symptoms worsen or if the condition fails to improve as anticipated.  I provided 15 minutes of non-face-to-face time during this encounter.  Horald Pollen, MD  Primary Care at [facility]

## 2020-03-03 NOTE — Patient Instructions (Signed)
° ° ° °  If you have lab work done today you will be contacted with your lab results within the next 2 weeks.  If you have not heard from us then please contact us. The fastest way to get your results is to register for My Chart. ° ° °IF you received an x-ray today, you will receive an invoice from [facility]. Please contact [facility] at [phone] with questions or concerns regarding your invoice.  ° °IF you received labwork today, you will receive an invoice from [facility]. Please contact [facility] at [phone] with questions or concerns regarding your invoice.  ° °Our billing staff will not be able to assist you with questions regarding bills from these companies. ° °You will be contacted with the lab results as soon as they are available. The fastest way to get your results is to activate your My Chart account. Instructions are located on the last page of this paperwork. If you have not heard from us regarding the results in 2 weeks, please contact this office. °  ° ° ° °

## 2020-03-05 DIAGNOSIS — F331 Major depressive disorder, recurrent, moderate: Secondary | ICD-10-CM | POA: Diagnosis not present

## 2020-03-05 DIAGNOSIS — F411 Generalized anxiety disorder: Secondary | ICD-10-CM | POA: Diagnosis not present

## 2020-03-05 DIAGNOSIS — F4310 Post-traumatic stress disorder, unspecified: Secondary | ICD-10-CM | POA: Diagnosis not present

## 2020-03-25 ENCOUNTER — Ambulatory Visit: Payer: BC Managed Care – PPO | Admitting: Emergency Medicine

## 2020-03-25 ENCOUNTER — Ambulatory Visit (INDEPENDENT_AMBULATORY_CARE_PROVIDER_SITE_OTHER): Payer: BC Managed Care – PPO | Admitting: Emergency Medicine

## 2020-03-25 ENCOUNTER — Other Ambulatory Visit: Payer: Self-pay

## 2020-03-25 ENCOUNTER — Encounter: Payer: Self-pay | Admitting: Emergency Medicine

## 2020-03-25 VITALS — BP 109/70 | HR 67 | Temp 97.9°F | Ht 66.0 in | Wt 209.0 lb

## 2020-03-25 DIAGNOSIS — J0100 Acute maxillary sinusitis, unspecified: Secondary | ICD-10-CM | POA: Diagnosis not present

## 2020-03-25 DIAGNOSIS — R0981 Nasal congestion: Secondary | ICD-10-CM

## 2020-03-25 DIAGNOSIS — J029 Acute pharyngitis, unspecified: Secondary | ICD-10-CM

## 2020-03-25 MED ORDER — AMOXICILLIN-POT CLAVULANATE 875-125 MG PO TABS: 1.0000 | ORAL_TABLET | Freq: Two times a day (BID) | ORAL | 0 refills | Status: AC

## 2020-03-25 NOTE — Patient Instructions (Addendum)
   If you have lab work done today you will be contacted with your lab results within the next 2 weeks.  If you have not heard from us then please contact us. The fastest way to get your results is to register for My Chart.   IF you received an x-ray today, you will receive an invoice from [facility]. Please contact [facility] at [phone] with questions or concerns regarding your invoice.   IF you received labwork today, you will receive an invoice from [facility]. Please contact [facility] at [phone] with questions or concerns regarding your invoice.   Our billing staff will not be able to assist you with questions regarding bills from these companies.  You will be contacted with the lab results as soon as they are available. The fastest way to get your results is to activate your My Chart account. Instructions are located on the last page of this paperwork. If you have not heard from us regarding the results in 2 weeks, please contact this office.      Sinusitis, Adult Sinusitis is soreness and swelling (inflammation) of your sinuses. Sinuses are hollow spaces in the bones around your face. They are located:  Around your eyes.  In the middle of your forehead.  Behind your nose.  In your cheekbones. Your sinuses and nasal passages are lined with a fluid called mucus. Mucus drains out of your sinuses. Swelling can trap mucus in your sinuses. This lets germs (bacteria, virus, or fungus) grow, which leads to infection. Most of the time, this condition is caused by a virus. What are the causes? This condition is caused by:  Allergies.  Asthma.  Germs.  Things that block your nose or sinuses.  Growths in the nose (nasal polyps).  Chemicals or irritants in the air.  Fungus (rare). What increases the risk? You are more likely to develop this condition if:  You have a weak body defense system (immune system).  You do a lot of swimming or  diving.  You use nasal sprays too much.  You smoke. What are the signs or symptoms? The main symptoms of this condition are pain and a feeling of pressure around the sinuses. Other symptoms include:  Stuffy nose (congestion).  Runny nose (drainage).  Swelling and warmth in the sinuses.  Headache.  Toothache.  A cough that may get worse at night.  Mucus that collects in the throat or the back of the nose (postnasal drip).  Being unable to smell and taste.  Being very tired (fatigue).  A fever.  Sore throat.  Bad breath. How is this diagnosed? This condition is diagnosed based on:  Your symptoms.  Your medical history.  A physical exam.  Tests to find out if your condition is short-term (acute) or long-term (chronic). Your doctor may: ? Check your nose for growths (polyps). ? Check your sinuses using a tool that has a light (endoscope). ? Check for allergies or germs. ? Do imaging tests, such as an MRI or CT scan. How is this treated? Treatment for this condition depends on the cause and whether it is short-term or long-term.  If caused by a virus, your symptoms should go away on their own within 10 days. You may be given medicines to relieve symptoms. They include: ? Medicines that shrink swollen tissue in the nose. ? Medicines that treat allergies (antihistamines). ? A spray that treats swelling of the nostrils. ? Rinses that help get rid of thick mucus in   your nose (nasal saline washes).  If caused by bacteria, your doctor may wait to see if you will get better without treatment. You may be given antibiotic medicine if you have: ? A very bad infection. ? A weak body defense system.  If caused by growths in the nose, you may need to have surgery. Follow these instructions at home: Medicines  Take, use, or apply over-the-counter and prescription medicines only as told by your doctor. These may include nasal sprays.  If you were prescribed an antibiotic  medicine, take it as told by your doctor. Do not stop taking the antibiotic even if you start to feel better. Hydrate and humidify   Drink enough water to keep your pee (urine) pale yellow.  Use a cool mist humidifier to keep the humidity level in your home above 50%.  Breathe in steam for 10-15 minutes, 3-4 times a day, or as told by your doctor. You can do this in the bathroom while a hot shower is running.  Try not to spend time in cool or dry air. Rest  Rest as much as you can.  Sleep with your head raised (elevated).  Make sure you get enough sleep each night. General instructions   Put a warm, moist washcloth on your face 3-4 times a day, or as often as told by your doctor. This will help with discomfort.  Wash your hands often with soap and water. If there is no soap and water, use hand sanitizer.  Do not smoke. Avoid being around people who are smoking (secondhand smoke).  Keep all follow-up visits as told by your doctor. This is important. Contact a doctor if:  You have a fever.  Your symptoms get worse.  Your symptoms do not get better within 10 days. Get help right away if:  You have a very bad headache.  You cannot stop throwing up (vomiting).  You have very bad pain or swelling around your face or eyes.  You have trouble seeing.  You feel confused.  Your neck is stiff.  You have trouble breathing. Summary  Sinusitis is swelling of your sinuses. Sinuses are hollow spaces in the bones around your face.  This condition is caused by tissues in your nose that become inflamed or swollen. This traps germs. These can lead to infection.  If you were prescribed an antibiotic medicine, take it as told by your doctor. Do not stop taking it even if you start to feel better.  Keep all follow-up visits as told by your doctor. This is important. This information is not intended to replace advice given to you by your health care provider. Make sure you discuss  any questions you have with your health care provider. Document Revised: 01/30/2018 Document Reviewed: 01/30/2018 Elsevier Patient Education  2020 Elsevier Inc.  

## 2020-03-25 NOTE — Progress Notes (Signed)
[name] 59 y.o.   Chief Complaint  Patient presents with  . Hoarse    post nasal drip   . throat clearing    had a cold approx 6 weeks ago    HISTORY OF PRESENT ILLNESS: This is a 59 y.o. female complaining of hoarseness and sinus congestion with trouble clearing her throat for several weeks. Telemedicine visit on 03/03/2020 she was started on azithromycin and prednisone with some relief. Still having some persistent symptoms. Denies fever chills.  Denies chest pain or difficulty breathing.  Has some lingering cough. Able to eat and drink.  Denies nausea vomiting or diarrhea. No other complaints or medical concerns at this time.  HPI   Prior to Admission medications   Medication Sig Start Date End Date Taking? Authorizing Provider  Ascorbic Acid (VITAMIN C) 1000 MG tablet Take 2,000 mg by mouth at bedtime.   Yes [provider]  aspirin EC 81 MG EC tablet Take 1 tablet (81 mg total) by mouth daily. 10/21/19  Yes Dixie Dials, MD  Calcium Carb-Cholecalciferol (OYSTER SHELL CALCIUM 250+D PO) Take 2 tablets by mouth at bedtime.   Yes [provider]  Cholecalciferol (VITAMIN D) 50 MCG (2000 UT) tablet Take 4,000 Units by mouth at bedtime.    Yes [provider]  losartan (COZAAR) 50 MG tablet Take 25 mg by mouth 2 (two) times daily.    Yes [provider]  metoprolol succinate (TOPROL-XL) 25 MG 24 hr tablet Take 25 mg by mouth 2 (two) times daily.    Yes [provider]  Multiple Vitamins-Minerals (ZINC PO) Take 1 tablet by mouth at bedtime.   Yes [provider]  OVER THE COUNTER MEDICATION Take 1 capsule by mouth daily. Soothe and Heal for joint pain    Yes [provider]  PARoxetine (PAXIL CR) 25 MG 24 hr tablet Take 25 mg by mouth daily.   Yes [provider]  Polyethyl Glycol-Propyl Glycol (SYSTANE OP) Place 2 drops into both eyes daily.   Yes [provider]  potassium chloride SA  (KLOR-CON) 20 MEQ tablet TAKE 2 TABLETS(40 MEQ) BY MOUTH DAILY 02/25/20  Yes Rutherford Guys, MD  TURMERIC PO Take 1 capsule by mouth daily as needed (pain/inflammation).    Yes [provider]  fluticasone (FLONASE) 50 MCG/ACT nasal spray Place 1 spray into both nostrils at bedtime as needed for allergies or rhinitis. Patient not taking: Reported on 03/25/2020    [provider]  loratadine (CLARITIN) 10 MG tablet Take 10 mg by mouth daily as needed for allergies or rhinitis. Patient not taking: Reported on 03/25/2020    [provider]    Allergies  Allergen Reactions  . Ceftriaxone Sodium Itching  . Doxycycline Rash    Patient Active Problem List   Diagnosis Date Noted  . Stress incontinence of urine 10/31/2018  . History of diverticulosis 05/04/2017  . History of PSVT (paroxysmal supraventricular tachycardia) 06/24/2012  . Multinodular goiter 05/12/2012  . SLEEP APNEA 01/19/2008  . IRRITABLE BOWEL SYNDROME 01/18/2008    Past Medical History:  Diagnosis Date  . Anemia   . Anxiety   . Colon polyp 08/28/2012   Tubular adenoma  . Dense breast   . Depression   . Genital warts   . GERD (gastroesophageal reflux disease)   . Heart murmur   . Hemorrhoids   . Hx of cardiac catheterization 2009   normal coronary arteries  . Hx of menorrhagia  Novasure  . Hypokalemia   . Hypomagnesemia   . Hypophosphatemia   . Lactose intolerance in adult   . Mitral valve prolapse   . Normal cardiac stress test 03/09/2012   no ischemia  . Positive PPD   . Rapid heart beat    States "irreg heart beat"  . Thyroid disease    Thyroid nodules    Past Surgical History:  Procedure Laterality Date  . CARDIAC CATHETERIZATION     x2 with normal results per pt  . NOVASURE ABLATION    . UPPER GASTROINTESTINAL ENDOSCOPY  May 2013    Social History   Socioeconomic History  . Marital status: Divorced    Spouse name: Not on file  . Number of children: 2  . Years  of education: 65  . Highest education level: Not on file  Occupational History  . Occupation: dialysis Engineer, production: [employer]  Tobacco Use  . Smoking status: Never Smoker  . Smokeless tobacco: Never Used  Vaping Use  . Vaping Use: Never used  Substance and Sexual Activity  . Alcohol use: No  . Drug use: No  . Sexual activity: Yes  Other Topics Concern  . Not on file  Social History Narrative   Ms. Norbeck is a divorced [place] American female who works as a Engineer, 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 She lived in [place] and in [place] for a number of years her mom is from [place]. 16 years of education went to college at [employer] lives at home with her son who is in his 28s no pets   Neg ets tob etoh hx PA   6 hours of sleep   G2P2    TD2010  colonoscopy 2009   Now running  own business    HH of 2    Left handed    Caffeine use: tea sometimes         Social Determinants of Health   Financial Resource Strain:   . Difficulty of Paying Living Expenses:   Food Insecurity:   . Worried About Charity fundraiser in the Last Year:   . Arboriculturist in the Last Year:   Transportation Needs:   . Film/video editor (Medical):   [name] Lack of Transportation (Non-Medical):   Physical Activity:   . Days of Exercise per Week:   . Minutes of Exercise per Session:   Stress:   . Feeling of Stress :   Social Connections:   . Frequency of Communication with Friends and Family:   . Frequency of Social Gatherings with Friends and Family:   . Attends Religious Services:   . Active Member of Clubs or Organizations:   . Attends Archivist Meetings:   [name] Marital Status:   Intimate Partner Violence:   . Fear of Current or Ex-Partner:   . Emotionally Abused:   [name] Physically Abused:   . Sexually Abused:     Family History  Problem Relation Age of Onset  . Heart disease Father        died Mi 14  .  Hypertension Maternal Grandfather   . Diabetes Maternal Grandfather   . Heart disease Maternal Grandfather   . Colon cancer Maternal Grandfather   . Rectal cancer Maternal Grandfather   . Diabetes Maternal Grandmother   . Prostate cancer Neg Hx   . Breast cancer Neg Hx  Review of Systems  Constitutional: Negative.  Negative for chills, fever and malaise/fatigue.  HENT: Positive for congestion, sinus pain and sore throat. Negative for ear discharge, ear pain and nosebleeds.   Respiratory: Positive for cough. Negative for hemoptysis, shortness of breath and wheezing.   Cardiovascular: Negative.  Negative for chest pain and palpitations.  Gastrointestinal: Negative.  Negative for abdominal pain, blood in stool, diarrhea, melena, nausea and vomiting.  Genitourinary: Negative.  Negative for dysuria and hematuria.  Musculoskeletal: Negative.  Negative for back pain, myalgias and neck pain.  Skin: Negative.  Negative for rash.  Neurological: Negative.  Negative for dizziness and headaches.  All other systems reviewed and are negative.  Today's Vitals   03/25/20 1502  BP: 109/70  Pulse: 67  Temp: 97.9 F (36.6 C)  SpO2: 98%  Weight: 209 lb (94.8 kg)  Height: 5\' 6"  (1.676 m)   Body mass index is 33.73 kg/m.   Physical Exam Vitals reviewed.  Constitutional:      Appearance: Normal appearance.  HENT:     Head: Normocephalic.     Right Ear: Tympanic membrane, ear canal and external ear normal.     Left Ear: Tympanic membrane, ear canal and external ear normal.     Nose: Congestion present.     Mouth/Throat:     Mouth: Mucous membranes are moist.     Pharynx: Oropharynx is clear. No oropharyngeal exudate or posterior oropharyngeal erythema.  Eyes:     Extraocular Movements: Extraocular movements intact.     Conjunctiva/sclera: Conjunctivae normal.     Pupils: Pupils are equal, round, and reactive to light.  Cardiovascular:     Rate and Rhythm: Normal rate and regular  rhythm.     Pulses: Normal pulses.     Heart sounds: Normal heart sounds.  Pulmonary:     Effort: Pulmonary effort is normal.     Breath sounds: Normal breath sounds.  Musculoskeletal:        General: Normal range of motion.     Cervical back: Normal range of motion and neck supple. No tenderness.  Lymphadenopathy:     Cervical: No cervical adenopathy.  Skin:    General: Skin is warm and dry.     Capillary Refill: Capillary refill takes less than 2 seconds.  Neurological:     General: No focal deficit present.     Mental Status: She is alert and oriented to person, place, and time.  Psychiatric:        Mood and Affect: Mood normal.        Behavior: Behavior normal.      ASSESSMENT & PLAN: [name] was seen today for hoarse and throat clearing.  Diagnoses and all orders for this visit:  Sinus congestion  Sore throat  Acute non-recurrent maxillary sinusitis -     amoxicillin-clavulanate (AUGMENTIN) 875-125 MG tablet; Take 1 tablet by mouth 2 (two) times daily for 10 days.    Patient Instructions       If you have lab work done today you will be contacted with your lab results within the next 2 weeks.  If you have not heard from [place] then please contact us. The fastest way to get your results is to register for My Chart.   IF you received an x-ray today, you will receive an invoice from [facility]. Please contact [facility] at [phone] with questions or concerns regarding your invoice.   IF you received labwork today, you will receive an invoice from [facility].  Please contact [facility] at [phone] with questions or concerns regarding your invoice.   Our billing staff will not be able to assist you with questions regarding bills from these companies.  You will be contacted with the lab results as soon as they are available. The fastest way to get your results is to activate your My Chart account. Instructions are located on the last page of this  paperwork. If you have not heard from [place] regarding the results in 2 weeks, please contact this office.      Sinusitis, Adult Sinusitis is soreness and swelling (inflammation) of your sinuses. Sinuses are hollow spaces in the bones around your face. They are located:  Around your eyes.  In the middle of your forehead.  Behind your nose.  In your cheekbones. Your sinuses and nasal passages are lined with a fluid called mucus. Mucus drains out of your sinuses. Swelling can trap mucus in your sinuses. This lets germs (bacteria, virus, or fungus) grow, which leads to infection. Most of the time, this condition is caused by a virus. What are the causes? This condition is caused by:  Allergies.  Asthma.  Germs.  Things that block your nose or sinuses.  Growths in the nose (nasal polyps).  Chemicals or irritants in the air.  Fungus (rare). What increases the risk? You are more likely to develop this condition if:  You have a weak body defense system (immune system).  You do a lot of swimming or diving.  You use nasal sprays too much.  You smoke. What are the signs or symptoms? The main symptoms of this condition are pain and a feeling of pressure around the sinuses. Other symptoms include:  Stuffy nose (congestion).  Runny nose (drainage).  Swelling and warmth in the sinuses.  Headache.  Toothache.  A cough that may get worse at night.  Mucus that collects in the throat or the back of the nose (postnasal drip).  Being unable to smell and taste.  Being very tired (fatigue).  A fever.  Sore throat.  Bad breath. How is this diagnosed? This condition is diagnosed based on:  Your symptoms.  Your medical history.  A physical exam.  Tests to find out if your condition is short-term (acute) or long-term (chronic). Your doctor may: ? Check your nose for growths (polyps). ? Check your sinuses using a tool that has a light (endoscope). ? Check for  allergies or germs. ? Do imaging tests, such as an MRI or CT scan. How is this treated? Treatment for this condition depends on the cause and whether it is short-term or long-term.  If caused by a virus, your symptoms should go away on their own within 10 days. You may be given medicines to relieve symptoms. They include: ? Medicines that shrink swollen tissue in the nose. ? Medicines that treat allergies (antihistamines). ? A spray that treats swelling of the nostrils. ? Rinses that help get rid of thick mucus in your nose (nasal saline washes).  If caused by bacteria, your doctor may wait to see if you will get better without treatment. You may be given antibiotic medicine if you have: ? A very bad infection. ? A weak body defense system.  If caused by growths in the nose, you may need to have surgery. Follow these instructions at home: Medicines  Take, use, or apply over-the-counter and prescription medicines only as told by your doctor. These may include nasal sprays.  If you were prescribed an antibiotic  medicine, take it as told by your doctor. Do not stop taking the antibiotic even if you start to feel better. Hydrate and humidify   Drink enough water to keep your pee (urine) pale yellow.  Use a cool mist humidifier to keep the humidity level in your home above 50%.  Breathe in steam for 10-15 minutes, 3-4 times a day, or as told by your doctor. You can do this in the bathroom while a hot shower is running.  Try not to spend time in cool or dry air. Rest  Rest as much as you can.  Sleep with your head raised (elevated).  Make sure you get enough sleep each night. General instructions   Put a warm, moist washcloth on your face 3-4 times a day, or as often as told by your doctor. This will help with discomfort.  Wash your hands often with soap and water. If there is no soap and water, use hand sanitizer.  Do not smoke. Avoid being around people who are smoking  (secondhand smoke).  Keep all follow-up visits as told by your doctor. This is important. Contact a doctor if:  You have a fever.  Your symptoms get worse.  Your symptoms do not get better within 10 days. Get help right away if:  You have a very bad headache.  You cannot stop throwing up (vomiting).  You have very bad pain or swelling around your face or eyes.  You have trouble seeing.  You feel confused.  Your neck is stiff.  You have trouble breathing. Summary  Sinusitis is swelling of your sinuses. Sinuses are hollow spaces in the bones around your face.  This condition is caused by tissues in your nose that become inflamed or swollen. This traps germs. These can lead to infection.  If you were prescribed an antibiotic medicine, take it as told by your doctor. Do not stop taking it even if you start to feel better.  Keep all follow-up visits as told by your doctor. This is important. This information is not intended to replace advice given to you by your health care provider. Make sure you discuss any questions you have with your health care provider. Document Revised: 01/30/2018 Document Reviewed: 01/30/2018 Elsevier Patient Education  2020 Elsevier Inc.      Agustina Caroli, MD [facility]

## 2020-03-26 ENCOUNTER — Encounter: Payer: Self-pay | Admitting: Emergency Medicine

## 2020-04-28 ENCOUNTER — Ambulatory Visit (INDEPENDENT_AMBULATORY_CARE_PROVIDER_SITE_OTHER): Payer: 59 | Admitting: Otolaryngology

## 2020-04-28 ENCOUNTER — Encounter (INDEPENDENT_AMBULATORY_CARE_PROVIDER_SITE_OTHER): Payer: Self-pay | Admitting: Otolaryngology

## 2020-04-28 ENCOUNTER — Other Ambulatory Visit: Payer: Self-pay

## 2020-04-28 VITALS — Temp 97.3°F

## 2020-04-28 DIAGNOSIS — K219 Gastro-esophageal reflux disease without esophagitis: Secondary | ICD-10-CM | POA: Diagnosis not present

## 2020-04-28 DIAGNOSIS — J310 Chronic rhinitis: Secondary | ICD-10-CM | POA: Diagnosis not present

## 2020-04-28 NOTE — Progress Notes (Signed)
HPI: [name] is a 59 y.o. female who presents for evaluation of chronic postnasal drainage and excessive phlegm in her throat.  She states that she has to clear her throat a lot.  She has hoarseness that comes and goes but is not hoarse in the office today.  This began following a bad cold in February where she was treated with 2 rounds of Z-Pak.  The cold symptoms are doing much better but still has excessive phlegm and chronic throat clearing.  She has intermittent nasal congestion and has used Flonase intermittently.  She does have history of reflux in the past and has taken Protonix. She does not smoke.  Past Medical History:  Diagnosis Date  . Anemia   . Anxiety   . Colon polyp 08/28/2012   Tubular adenoma  . Dense breast   . Depression   . Genital warts   . GERD (gastroesophageal reflux disease)   . Heart murmur   . Hemorrhoids   . Hx of cardiac catheterization 2009   normal coronary arteries  . Hx of menorrhagia    Novasure  . Hypokalemia   . Hypomagnesemia   . Hypophosphatemia   . Lactose intolerance in adult   . Mitral valve prolapse   . Normal cardiac stress test 03/09/2012   no ischemia  . Positive PPD   . Rapid heart beat    States "irreg heart beat"  . Thyroid disease    Thyroid nodules   Past Surgical History:  Procedure Laterality Date  . CARDIAC CATHETERIZATION     x2 with normal results per pt  . NOVASURE ABLATION    . UPPER GASTROINTESTINAL ENDOSCOPY  May 2013   Social History   Socioeconomic History  . Marital status: Divorced    Spouse name: Not on file  . Number of children: 2  . Years of education: 61  . Highest education level: Not on file  Occupational History  . Occupation: dialysis Engineer, production: [employer]  Tobacco Use  . Smoking status: Never Smoker  . Smokeless tobacco: Never Used  Vaping Use  . Vaping Use: Never used  Substance and Sexual Activity  . Alcohol use: No  . Drug use: No  . Sexual  activity: Yes  Other Topics Concern  . Not on file  Social History Narrative   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 She lived in [place] and in [place] for a number of years her mom is from [place]. 16 years of education went to college at [employer] lives at home with her son who is in his 46s no pets   Neg ets tob etoh hx PA   6 hours of sleep   G2P2    TD2010  colonoscopy 2009   Now running  own business    HH of 2    Left handed    Caffeine use: tea sometimes         Social Determinants of Health   Financial Resource Strain:   . Difficulty of Paying Living Expenses:   Food Insecurity:   . Worried About Charity fundraiser in the Last Year:   . Arboriculturist in the Last Year:   Transportation Needs:   . Film/video editor (Medical):   [name] Lack of Transportation (Non-Medical):   Physical Activity:   . Days  of Exercise per Week:   . Minutes of Exercise per Session:   Stress:   . Feeling of Stress :   Social Connections:   . Frequency of Communication with Friends and Family:   . Frequency of Social Gatherings with Friends and Family:   . Attends Religious Services:   . Active Member of Clubs or Organizations:   . Attends Archivist Meetings:   [name] Marital Status:    Family History  Problem Relation Age of Onset  . Heart disease Father        died Mi 67  . Hypertension Maternal Grandfather   . Diabetes Maternal Grandfather   . Heart disease Maternal Grandfather   . Colon cancer Maternal Grandfather   . Rectal cancer Maternal Grandfather   . Diabetes Maternal Grandmother   . Prostate cancer Neg Hx   . Breast cancer Neg Hx    Allergies  Allergen Reactions  . Ceftriaxone Sodium Itching  . Doxycycline Rash   Prior to Admission medications   Medication Sig Start Date End Date Taking? Authorizing Provider  Ascorbic Acid  (VITAMIN C) 1000 MG tablet Take 2,000 mg by mouth at bedtime.   Yes [provider]  aspirin EC 81 MG EC tablet Take 1 tablet (81 mg total) by mouth daily. 10/21/19  Yes Dixie Dials, MD  Calcium Carb-Cholecalciferol (OYSTER SHELL CALCIUM 250+D PO) Take 2 tablets by mouth at bedtime.   Yes [provider]  Cholecalciferol (VITAMIN D) 50 MCG (2000 UT) tablet Take 4,000 Units by mouth at bedtime.    Yes [provider]  fluticasone (FLONASE) 50 MCG/ACT nasal spray Place 1 spray into both nostrils at bedtime as needed for allergies or rhinitis.    Yes [provider]  loratadine (CLARITIN) 10 MG tablet Take 10 mg by mouth daily as needed for allergies or rhinitis.    Yes [provider]  losartan (COZAAR) 50 MG tablet Take 25 mg by mouth 2 (two) times daily.    Yes [provider]  metoprolol succinate (TOPROL-XL) 25 MG 24 hr tablet Take 25 mg by mouth 2 (two) times daily.    Yes [provider]  Multiple Vitamins-Minerals (ZINC PO) Take 1 tablet by mouth at bedtime.   Yes [provider]  OVER THE COUNTER MEDICATION Take 1 capsule by mouth daily. Soothe and Heal for joint pain    Yes [provider]  PARoxetine (PAXIL CR) 25 MG 24 hr tablet Take 25 mg by mouth daily.   Yes [provider]  Polyethyl Glycol-Propyl Glycol (SYSTANE OP) Place 2 drops into both eyes daily.   Yes [provider]  potassium chloride SA (KLOR-CON) 20 MEQ tablet TAKE 2 TABLETS(40 MEQ) BY MOUTH DAILY 02/25/20  Yes Rutherford Guys, MD  TURMERIC PO Take 1 capsule by mouth daily as needed (pain/inflammation).    Yes [provider]     Positive ROS: Otherwise negative  All other systems have been reviewed and were otherwise negative with the exception of those mentioned in the HPI and as above.  Physical Exam: Constitutional: Alert, well-appearing, no acute distress.  She has normal voice in the office today with no  hoarseness. Ears: External ears without lesions or tenderness. Ear canals are clear bilaterally with intact, clear TMs.  Nasal: External nose without lesions. Septum midline with moderate rhinitis.  After decongesting the nose both the middle meatus regions were clear with no signs of infection.  Minimal clear mucus within the  nasal cavity..  No polyps or significant edema. Oral: Lips and gums without lesions. Tongue and palate mucosa without lesions. Posterior oropharynx clear.  No excessive mucus in the posterior oropharynx. Indirect laryngoscopy revealed a clear base of tongue vallecula and epiglottis.  Vocal cords were clear bilaterally with normal vocal cord mobility.  Moderate arytenoid edema. Neck: No palpable adenopathy or masses Respiratory: Breathing comfortably  Skin: No facial/neck lesions or rash noted.  Procedures  Assessment: Chronic rhinitis and probable GE reflux disease contributing to globus type symptoms.  Plan: Recommended regular use of Flonase 2 sprays each nostril at night as well as saline irrigation as needed excessive mucus within the nasal cavity. Also suggested taking her Protonix which she has been taking intermittently on a regular basis before dinner for the next 3 to 4 weeks.  Radene Journey, MD

## 2020-06-02 ENCOUNTER — Other Ambulatory Visit: Payer: Self-pay

## 2020-06-02 ENCOUNTER — Ambulatory Visit (INDEPENDENT_AMBULATORY_CARE_PROVIDER_SITE_OTHER): Payer: 59 | Admitting: Podiatry

## 2020-06-02 DIAGNOSIS — M7741 Metatarsalgia, right foot: Secondary | ICD-10-CM

## 2020-06-02 DIAGNOSIS — M7742 Metatarsalgia, left foot: Secondary | ICD-10-CM | POA: Diagnosis not present

## 2020-06-02 DIAGNOSIS — G5763 Lesion of plantar nerve, bilateral lower limbs: Secondary | ICD-10-CM | POA: Diagnosis not present

## 2020-06-02 NOTE — Progress Notes (Signed)
   HPI: 59 y.o. female presenting today for evaluation of bilateral forefoot pain is been going on for several months now.  Patient recently went to the good feet store where OTC insoles were provided.  She paid one thousand dollars to the insoles.  She states she only gets minimal relief from the insoles.  She states that the whole front of the foot along the ball of the foot hurts to the both feet especially when walking.  She denies injury and presents for further treatment and evaluation  Past Medical History:  Diagnosis Date  . Anemia   . Anxiety   . Colon polyp 08/28/2012   Tubular adenoma  . Dense breast   . Depression   . Genital warts   . GERD (gastroesophageal reflux disease)   . Heart murmur   . Hemorrhoids   . Hx of cardiac catheterization 2009   normal coronary arteries  . Hx of menorrhagia    Novasure  . Hypokalemia   . Hypomagnesemia   . Hypophosphatemia   . Lactose intolerance in adult   . Mitral valve prolapse   . Normal cardiac stress test 03/09/2012   no ischemia  . Positive PPD   . Rapid heart beat    States "irreg heart beat"  . Thyroid disease    Thyroid nodules     Physical Exam: General: The patient is alert and oriented x3 in no acute distress.  Dermatology: Skin is warm, dry and supple bilateral lower extremities. Negative for open lesions or macerations.  Vascular: Palpable pedal pulses bilaterally. No edema or erythema noted. Capillary refill within normal limits.  Neurological: Epicritic and protective threshold grossly intact bilaterally.   Musculoskeletal Exam: Range of motion within normal limits to all pedal and ankle joints bilateral. Muscle strength 5/5 in all groups bilateral.  Pain on palpation to the metatarsophalangeal joints of the bilateral feet.  Pain with palpation to the third intermetatarsal space and lateral compression consistent with findings of a Morton's neuroma.  Assessment: 1.  Morton's neuroma third interspace  bilateral 2.  Generalized metatarsalgia bilateral   Plan of Care:  1. Patient evaluated.  2.  Patient declined any oral medication. 3.  OTC power step insoles provided.  Wear daily with her new balance shoes 4.  Plantar fascial braces provided.  Wear daily 5.  Appointment with Pedorthist for custom molded insoles 6.  Continue turmeric daily 7.  Return to clinic as needed      Edrick Kins, DPM [facility]  Dr. Edrick Kins, DPM    [address], [place] [address]                Office [phone]  Fax [phone]

## 2020-07-17 ENCOUNTER — Other Ambulatory Visit: Payer: Self-pay

## 2020-07-17 ENCOUNTER — Encounter: Payer: Self-pay | Admitting: Family Medicine

## 2020-07-17 ENCOUNTER — Ambulatory Visit (INDEPENDENT_AMBULATORY_CARE_PROVIDER_SITE_OTHER): Payer: 59 | Admitting: Family Medicine

## 2020-07-17 ENCOUNTER — Other Ambulatory Visit: Payer: Self-pay | Admitting: Family Medicine

## 2020-07-17 VITALS — BP 111/69 | HR 68 | Temp 97.6°F | Ht 66.0 in | Wt 210.0 lb

## 2020-07-17 DIAGNOSIS — Z23 Encounter for immunization: Secondary | ICD-10-CM

## 2020-07-17 DIAGNOSIS — M542 Cervicalgia: Secondary | ICD-10-CM

## 2020-07-17 MED ORDER — CYCLOBENZAPRINE HCL 5 MG PO TABS: 5.0000 mg | ORAL_TABLET | Freq: Every evening | ORAL | 1 refills | Status: DC | PRN

## 2020-07-17 MED ORDER — MELOXICAM 15 MG PO TABS: 15.0000 mg | ORAL_TABLET | Freq: Every day | ORAL | 0 refills | Status: DC

## 2020-07-17 NOTE — Progress Notes (Signed)
11/4/20213:43 PM  Mercedies Kierston Wair Dec 19, 1960, 59 y.o., female [id number]  Chief Complaint  Patient presents with  . knot on back of neck    preassure in R ear / dizzynes/ headcahes/ trouble sleeping    HPI:   Patient is a 59 y.o. female with past medical history significant for IBS, goiter, PSVT, and diverticulossis who presents today for neck pain   Diagnosed with Bulging disc 2003 Cervical MRI 2003:  1)  MINIMAL DISK BULGES AT C3-4 AND C4-5 WITH VERY MILD CENTRAL SPINAL NARROWING WITH SPINAL CANAL  AP DIAMETER MEASURING 9 MM AT BOTH OF THESE LEVELS.  2)  PLEASE NOTE THAT THIS IS A SLIGHTLY LIMITED STUDY AS T2 AXIAL IMAGES WERE NOT OBTAINED  SECONDARY TO PATIENT'S REFUSAL.  Now is noticing a bulge to her neck Neck hurts when turns to left Works on the computer  Dizzy at times, and headache Denies Numbness and tingling to arms Point tenderness to back of head    Patient Care Team: Georgina Quint, MD as PCP - General (Internal Medicine) Orpah Cobb, MD as Attending Physician (Internal Medicine) Julious Oka, MD (Obstetrics and Gynecology)   Depression screen [facility] 2/9 07/17/2020 03/25/2020 03/03/2020  Decreased Interest 0 0 0  Down, Depressed, Hopeless 0 0 0  PHQ - 2 Score 0 0 0  Some recent data might be hidden    Fall Risk  07/17/2020 03/25/2020 03/03/2020 01/17/2020 09/11/2019  Falls in the past year? 0 0 0 0 0  Number falls in past yr: 0 0 0 - -  Injury with Fall? 0 0 0 - -  Follow up Falls evaluation completed Falls evaluation completed Falls evaluation completed Falls evaluation completed Falls evaluation completed     Allergies  Allergen Reactions  . Ceftriaxone Sodium Itching  . Doxycycline Rash    Prior to Admission medications   Medication Sig Start Date End Date Taking? Authorizing Provider  Ascorbic Acid (VITAMIN C) 1000 MG tablet Take 2,000 mg by mouth at bedtime.    [provider]  aspirin EC 81 MG EC  tablet Take 1 tablet (81 mg total) by mouth daily. 10/21/19   Orpah Cobb, MD  Calcium Carb-Cholecalciferol (OYSTER SHELL CALCIUM 250+D PO) Take 2 tablets by mouth at bedtime.    [provider]  Cholecalciferol (VITAMIN D) 50 MCG (2000 UT) tablet Take 4,000 Units by mouth at bedtime.     [provider]  fluticasone (FLONASE) 50 MCG/ACT nasal spray Place 1 spray into both nostrils at bedtime as needed for allergies or rhinitis.     [provider]  loratadine (CLARITIN) 10 MG tablet Take 10 mg by mouth daily as needed for allergies or rhinitis.     [provider]  losartan (COZAAR) 50 MG tablet Take 25 mg by mouth 2 (two) times daily.     [provider]  metoprolol succinate (TOPROL-XL) 25 MG 24 hr tablet Take 25 mg by mouth 2 (two) times daily.     [provider]  Multiple Vitamins-Minerals (ZINC PO) Take 1 tablet by mouth at bedtime.    [provider]  OVER THE COUNTER MEDICATION Take 1 capsule by mouth daily. Soothe and Heal for joint pain     [provider]  PARoxetine (PAXIL CR) 25 MG 24 hr tablet Take 25 mg by mouth daily.    [provider]  Polyethyl Glycol-Propyl Glycol (SYSTANE OP) Place 2 drops into both eyes daily.    [provider]  potassium chloride SA (KLOR-CON) 20 MEQ tablet TAKE 2 TABLETS(40 MEQ) BY MOUTH DAILY 02/25/20   Myles Lipps, MD  TURMERIC PO Take 1 capsule by mouth daily as needed (pain/inflammation).     [provider]    Past Medical History:  Diagnosis Date  . Anemia   . Anxiety   . Colon polyp 08/28/2012   Tubular adenoma  . Dense breast   . Depression   . Genital warts   . GERD (gastroesophageal reflux disease)   . Heart murmur   . Hemorrhoids   . Hx of cardiac catheterization 2009   normal coronary arteries  . Hx of menorrhagia    Novasure  . Hypokalemia   . Hypomagnesemia   . Hypophosphatemia   . Lactose intolerance in adult   . Meniscus  degeneration, right    torn r knee/ 10/2019  . Mitral valve prolapse   . Normal cardiac stress test 03/09/2012   no ischemia  . Positive PPD   . Rapid heart beat    States "irreg heart beat"  . Thyroid disease    Thyroid nodules    Past Surgical History:  Procedure Laterality Date  . CARDIAC CATHETERIZATION     x2 with normal results per pt  . NOVASURE ABLATION    . UPPER GASTROINTESTINAL ENDOSCOPY  May 2013    Social History   Tobacco Use  . Smoking status: Never Smoker  . Smokeless tobacco: Never Used  Substance Use Topics  . Alcohol use: No    Family History  Problem Relation Age of Onset  . Heart disease Father        died Mi 32  . Hypertension Maternal Grandfather   . Diabetes Maternal Grandfather   . Heart disease Maternal Grandfather   . Colon cancer Maternal Grandfather   . Rectal cancer Maternal Grandfather   . Diabetes Maternal Grandmother   . Prostate cancer Neg Hx   . Breast cancer Neg Hx     Review of Systems  Constitutional: Positive for malaise/fatigue (insomnia from pain). Negative for chills and fever.  HENT: Negative for tinnitus.   Eyes: Negative for blurred vision and double vision.  Respiratory: Negative for cough, shortness of breath and wheezing.   Cardiovascular: Negative for chest pain, palpitations and leg swelling.  Gastrointestinal: Negative for abdominal pain, blood in stool, constipation, diarrhea, heartburn, nausea and vomiting.  Genitourinary: Negative for dysuria and frequency.  Musculoskeletal: Positive for neck pain. Negative for back pain and joint pain.       Tightness to muscles around neck and upper back  Skin: Negative for rash.  Neurological: Positive for dizziness (intermittent) and headaches (intermittent). Negative for tingling, sensory change, focal weakness and weakness.     OBJECTIVE:  Today's Vitals   07/17/20 1455  BP: 111/69  Pulse: 68  Temp: 97.6 F (36.4 C)  SpO2: 98%  Weight: 210 lb (95.3 kg)   Height: 5\' 6"  (1.676 m)   Body mass index is 33.89 kg/m.   Physical Exam Constitutional:      General: She is not in acute distress.    Appearance: Normal appearance. She is not ill-appearing.  HENT:     Head: Normocephalic.  Cardiovascular:     Rate and Rhythm: Normal rate and regular rhythm.     Pulses: Normal pulses.     Heart sounds: Normal heart sounds. No murmur heard.  No friction rub. No gallop.   Pulmonary:     Effort: Pulmonary  effort is normal. No respiratory distress.     Breath sounds: Normal breath sounds. No stridor. No wheezing, rhonchi or rales.  Abdominal:     General: Bowel sounds are normal.     Palpations: Abdomen is soft.     Tenderness: There is no abdominal tenderness.  Musculoskeletal:        General: No tenderness or deformity.     Cervical back: Normal range of motion and neck supple. No rigidity. Tenderness: back of head.     Thoracic back: Normal.     Lumbar back: Normal.     Right lower leg: No edema.     Left lower leg: No edema.  Skin:    General: Skin is warm and dry.  Neurological:     Mental Status: She is alert and oriented to person, place, and time.  Psychiatric:        Mood and Affect: Mood normal.        Behavior: Behavior normal.     No results found for this or any previous visit (from the past 24 hour(s)).  No results found.   ASSESSMENT and PLAN  Problem List Items Addressed This Visit    None    Visit Diagnoses    Neck pain    -  Primary   Relevant Medications   cyclobenzaprine (FLEXERIL) 5 MG tablet   meloxicam (MOBIC) 15 MG tablet:  Educated on r/se/b   Other Relevant Orders   Ambulatory referral to Physical Therapy Educated on chronic nature from body mechanics   Encounter for immunization       Relevant Orders   Tdap vaccine greater than or equal to 7yo IM (Completed)       Return in about 4 weeks (around 08/14/2020), or if symptoms worsen or fail to improve.    Macario Carls Jerzee Jerome, FNP-BC Primary Care  at [facility] [address] [place], [place] [address] Ph.  [phone] Fax [phone]

## 2020-07-17 NOTE — Patient Instructions (Addendum)
If you have lab work done today you will be contacted with your lab results within the next 2 weeks.  If you have not heard from [place] then please contact us. The fastest way to get your results is to register for My Chart.   IF you received an x-ray today, you will receive an invoice from [facility]. Please contact [facility] at [phone] with questions or concerns regarding your invoice.   IF you received labwork today, you will receive an invoice from [facility]. Please contact [facility] at [phone] with questions or concerns regarding your invoice.   Our billing staff will not be able to assist you with questions regarding bills from these companies.  You will be contacted with the lab results as soon as they are available. The fastest way to get your results is to activate your My Chart account. Instructions are located on the last page of this paperwork. If you have not heard from [place] regarding the results in 2 weeks, please contact this office.      Cervical Radiculopathy  Cervical radiculopathy means that a nerve in the neck (a cervical nerve) is pinched or bruised. This can happen because of an injury to the cervical spine (vertebrae) in the neck, or as a normal part of getting older. This can cause pain or loss of feeling (numbness) that runs from your neck all the way down to your arm and fingers. Often, this condition gets better with rest. Treatment may be needed if the condition does not get better. What are the causes?  A neck injury.  A bulging disk in your spine.  Muscle movements that you cannot control (muscle spasms).  Tight muscles in your neck due to overuse.  Arthritis.  Breakdown in the bones and joints of the spine (spondylosis) due to getting older.  Bone spurs that form near the nerves in the neck. What are the signs or symptoms?  Pain. The pain may: ? Run from the neck to the arm and hand. ? Be very bad or irritating. ? Be worse  when you move your neck.  Loss of feeling or tingling in your arm or hand.  Weakness in your arm or hand, in very bad cases. How is this treated? In many cases, treatment is not needed for this condition. With rest, the condition often gets better over time. If treatment is needed, options may include:  Wearing a soft neck collar (cervical collar) for short periods of time, as told by your doctor.  Doing exercises (physical therapy) to strengthen your neck muscles.  Taking medicines.  Having shots (injections) in your spine, in very bad cases.  Having surgery. This may be needed if other treatments do not help. The type of surgery that is used depends on the cause of your condition. Follow these instructions at home: If you have a soft neck collar:  Wear it as told by your doctor. Remove it only as told by your doctor.  Ask your doctor if you can remove the collar for cleaning and bathing. If you are allowed to remove the collar for cleaning or bathing: ? Follow instructions from your doctor about how to remove the collar safely. ? Clean the collar by wiping it with mild soap and water and drying it completely. ? Take out any removable pads in the collar every 1-2 days. Wash them by hand with soap and water. Let them air-dry completely before you put them back in the collar. ? Check your  skin under the collar for redness or sores. If you see any, tell your doctor. Managing pain      Take over-the-counter and prescription medicines only as told by your doctor.  If told, put ice on the painful area. ? If you have a soft neck collar, remove it as told by your doctor. ? Put ice in a plastic bag. ? Place a towel between your skin and the bag. ? Leave the ice on for 20 minutes, 2-3 times a day.  If using ice does not help, you can try using heat. Use the heat source that your doctor recommends, such as a moist heat pack or a heating pad. ? Place a towel between your skin and the  heat source. ? Leave the heat on for 20-30 minutes. ? Remove the heat if your skin turns bright red. This is very important if you are unable to feel pain, heat, or cold. You may have a greater risk of getting burned.  You may try a gentle neck and shoulder rub (massage). Activity  Rest as needed.  Return to your normal activities as told by your doctor. Ask your doctor what activities are safe for you.  Do exercises as told by your doctor or physical therapist.  Do not lift anything that is heavier than 10 lb (4.5 kg) until your doctor tells you that it is safe. General instructions  Use a flat pillow when you sleep.  Do not drive while wearing a soft neck collar. If you do not have a soft neck collar, ask your doctor if it is safe to drive while your neck heals.  Ask your doctor if the medicine prescribed to you requires you to avoid driving or using heavy machinery.  Do not use any products that contain nicotine or tobacco, such as cigarettes, e-cigarettes, and chewing tobacco. These can delay healing. If you need help quitting, ask your doctor.  Keep all follow-up visits as told by your doctor. This is important. Contact a doctor if:  Your condition does not get better with treatment. Get help right away if:  Your pain gets worse and is not helped with medicine.  You lose feeling or feel weak in your hand, arm, face, or leg.  You have a high fever.  You have a stiff neck.  You cannot control when you poop or pee (have incontinence).  You have trouble with walking, balance, or talking. Summary  Cervical radiculopathy means that a nerve in the neck is pinched or bruised.  A nerve can get pinched from a bulging disk, arthritis, an injury to the neck, or other causes.  Symptoms include pain, tingling, or loss of feeling that goes from the neck into the arm or hand.  Weakness in your arm or hand can happen in very bad cases.  Treatment may include resting, wearing a  soft neck collar, and doing exercises. You might need to take medicines for pain. In very bad cases, shots or surgery may be needed. This information is not intended to replace advice given to you by your health care provider. Make sure you discuss any questions you have with your health care provider. Document Revised: 07/21/2018 Document Reviewed: 07/21/2018 Elsevier Patient Education  [place].  Cervical Strain and Sprain Rehab Ask your health care provider which exercises are safe for you. Do exercises exactly as told by your health care provider and adjust them as directed. It is normal to feel mild stretching, pulling, tightness, or discomfort  as you do these exercises. Stop right away if you feel sudden pain or your pain gets worse. Do not begin these exercises until told by your health care provider. Stretching and range-of-motion exercises Cervical side bending  1. Using good posture, sit on a stable chair or stand up. 2. Without moving your shoulders, slowly tilt your left / right ear to your shoulder until you feel a stretch in the opposite side neck muscles. You should be looking straight ahead. 3. Hold for __________ seconds. 4. Repeat with the other side of your neck. Repeat __________ times. Complete this exercise __________ times a day. Cervical rotation  1. Using good posture, sit on a stable chair or stand up. 2. Slowly turn your head to the side as if you are looking over your left / right shoulder. ? Keep your eyes level with the ground. ? Stop when you feel a stretch along the side and the back of your neck. 3. Hold for __________ seconds. 4. Repeat this by turning to your other side. Repeat __________ times. Complete this exercise __________ times a day. Thoracic extension and pectoral stretch 1. Roll a towel or a small blanket so it is about 4 inches (10 cm) in diameter. 2. Lie down on your back on a firm surface. 3. Put the towel lengthwise, under your  spine in the middle of your back. It should not be under your shoulder blades. The towel should line up with your spine from your middle back to your lower back. 4. Put your hands behind your head and let your elbows fall out to your sides. 5. Hold for __________ seconds. Repeat __________ times. Complete this exercise __________ times a day. Strengthening exercises Isometric upper cervical flexion 1. Lie on your back with a thin pillow behind your head and a small rolled-up towel under your neck. 2. Gently tuck your chin toward your chest and nod your head down to look toward your feet. Do not lift your head off the pillow. 3. Hold for __________ seconds. 4. Release the tension slowly. Relax your neck muscles completely before you repeat this exercise. Repeat __________ times. Complete this exercise __________ times a day. Isometric cervical extension  1. Stand about 6 inches (15 cm) away from a wall, with your back facing the wall. 2. Place a soft object, about 6-8 inches (15-20 cm) in diameter, between the back of your head and the wall. A soft object could be a small pillow, a ball, or a folded towel. 3. Gently tilt your head back and press into the soft object. Keep your jaw and forehead relaxed. 4. Hold for __________ seconds. 5. Release the tension slowly. Relax your neck muscles completely before you repeat this exercise. Repeat __________ times. Complete this exercise __________ times a day. Posture and body mechanics Body mechanics refers to the movements and positions of your body while you do your daily activities. Posture is part of body mechanics. Good posture and healthy body mechanics can help to relieve stress in your body's tissues and joints. Good posture means that your spine is in its natural S-curve position (your spine is neutral), your shoulders are pulled back slightly, and your head is not tipped forward. The following are general guidelines for applying improved posture  and body mechanics to your everyday activities. Sitting  1. When sitting, keep your spine neutral and keep your feet flat on the floor. Use a footrest, if necessary, and keep your thighs parallel to the floor. Avoid rounding your  shoulders, and avoid tilting your head forward. 2. When working at a desk or a computer, keep your desk at a height where your hands are slightly lower than your elbows. Slide your chair under your desk so you are close enough to maintain good posture. 3. When working at a computer, place your monitor at a height where you are looking straight ahead and you do not have to tilt your head forward or downward to look at the screen. Standing   When standing, keep your spine neutral and keep your feet about hip-width apart. Keep a slight bend in your knees. Your ears, shoulders, and hips should line up.  When you do a task in which you stand in one place for a long time, place one foot up on a stable object that is 2-4 inches (5-10 cm) high, such as a footstool. This helps keep your spine neutral. Resting When lying down and resting, avoid positions that are most painful for you. Try to support your neck in a neutral position. You can use a contour pillow or a small rolled-up towel. Your pillow should support your neck but not push on it. This information is not intended to replace advice given to you by your health care provider. Make sure you discuss any questions you have with your health care provider. Document Revised: 12/20/2018 Document Reviewed: 05/31/2018 Elsevier Patient Education  [place].

## 2020-08-05 ENCOUNTER — Ambulatory Visit: Payer: 59 | Admitting: Physical Therapy

## 2020-08-12 ENCOUNTER — Other Ambulatory Visit: Payer: Self-pay

## 2020-08-12 ENCOUNTER — Other Ambulatory Visit: Payer: Self-pay | Admitting: Emergency Medicine

## 2020-08-12 ENCOUNTER — Telehealth: Payer: Self-pay | Admitting: Emergency Medicine

## 2020-08-12 DIAGNOSIS — E876 Hypokalemia: Secondary | ICD-10-CM

## 2020-08-12 MED ORDER — POTASSIUM CHLORIDE CRYS ER 20 MEQ PO TBCR: EXTENDED_RELEASE_TABLET | ORAL | 0 refills | Status: DC

## 2020-08-12 NOTE — Telephone Encounter (Signed)
Scripts filled pt must keep appt 08/25/2020

## 2020-08-12 NOTE — Telephone Encounter (Signed)
Pt sent 30 day supply of medication for potassium. She is currently out of town. Needs appt for future refills

## 2020-08-12 NOTE — Telephone Encounter (Signed)
Called pt and sch appt for when she gets back into town on 08/25/20. Pt would like a curtsey refill until she has her appt. Please advise.

## 2020-08-12 NOTE — Telephone Encounter (Signed)
Patient out of town and needs potassium prescription refilled. Has two day supply left. Please call in to [facility] in [place], [place] at [phone]. Please advise at [phone].

## 2020-08-25 ENCOUNTER — Ambulatory Visit: Payer: 59 | Admitting: Emergency Medicine

## 2020-08-26 ENCOUNTER — Ambulatory Visit: Payer: 59 | Admitting: Emergency Medicine

## 2020-08-27 ENCOUNTER — Encounter: Payer: Self-pay | Admitting: Emergency Medicine

## 2020-08-27 ENCOUNTER — Other Ambulatory Visit: Payer: Self-pay

## 2020-08-27 ENCOUNTER — Telehealth (INDEPENDENT_AMBULATORY_CARE_PROVIDER_SITE_OTHER): Payer: 59 | Admitting: Emergency Medicine

## 2020-08-27 ENCOUNTER — Ambulatory Visit: Payer: 59 | Admitting: Emergency Medicine

## 2020-08-27 DIAGNOSIS — E876 Hypokalemia: Secondary | ICD-10-CM | POA: Diagnosis not present

## 2020-08-27 NOTE — Progress Notes (Signed)
Telemedicine Encounter- SOAP NOTE Established Patient Patient: Home  Provider: Office     This telephone encounter was conducted with the patient's (or proxy's) verbal consent via audio telecommunications: yes/no: Yes Patient was instructed to have this encounter in a suitably private space; and to only have persons present to whom they give permission to participate. In addition, patient identity was confirmed by use of name plus two identifiers (DOB and address).  I discussed the limitations, risks, security and privacy concerns of performing an evaluation and management service by telephone and the availability of in person appointments. I also discussed with the patient that there may be a patient responsible charge related to this service. The patient expressed understanding and agreed to proceed.  I spent a total of 10 minutes talking with the patient or their proxy.  Chief Complaint  Patient presents with   Medication Refill    Klor-Con    Subjective   [name] is a 59 y.o. female established patient. Telephone visit today for medication refill. No other complaints or medical concerns today.  HPI   Patient Active Problem List   Diagnosis Date Noted   Stress incontinence of urine 10/31/2018   History of diverticulosis 05/04/2017   History of PSVT (paroxysmal supraventricular tachycardia) 06/24/2012   Multinodular goiter 05/12/2012   SLEEP APNEA 01/19/2008   IRRITABLE BOWEL SYNDROME 01/18/2008    Past Medical History:  Diagnosis Date   Anemia    Anxiety    Colon polyp 08/28/2012   Tubular adenoma   Dense breast    Depression    Genital warts    GERD (gastroesophageal reflux disease)    Heart murmur    Hemorrhoids    Hx of cardiac catheterization 2009   normal coronary arteries   Hx of menorrhagia    Novasure   Hypokalemia    Hypomagnesemia    Hypophosphatemia    Lactose intolerance in adult    Meniscus degeneration,  right    torn r knee/ 10/2019   Mitral valve prolapse    Normal cardiac stress test 03/09/2012   no ischemia   Positive PPD    Rapid heart beat    States "irreg heart beat"   Thyroid disease    Thyroid nodules    Current Outpatient Medications  Medication Sig Dispense Refill   Ascorbic Acid (VITAMIN C) 1000 MG tablet Take 2,000 mg by mouth at bedtime.     aspirin EC 81 MG EC tablet Take 1 tablet (81 mg total) by mouth daily.     Calcium Carb-Cholecalciferol (OYSTER SHELL CALCIUM 250+D PO) Take 2 tablets by mouth at bedtime.     Cholecalciferol (VITAMIN D) 50 MCG (2000 UT) tablet Take 4,000 Units by mouth at bedtime.      fluticasone (FLONASE) 50 MCG/ACT nasal spray Place 1 spray into both nostrils at bedtime as needed for allergies or rhinitis.      loratadine (CLARITIN) 10 MG tablet Take 10 mg by mouth daily as needed for allergies or rhinitis.      losartan (COZAAR) 50 MG tablet Take 25 mg by mouth 2 (two) times daily.      metoprolol succinate (TOPROL-XL) 25 MG 24 hr tablet Take 25 mg by mouth 2 (two) times daily.      Multiple Vitamins-Minerals (ZINC PO) Take 1 tablet by mouth at bedtime.     OVER THE COUNTER MEDICATION Take 1 capsule by mouth daily. Soothe and Heal for joint pain  PARoxetine (PAXIL-CR) 25 MG 24 hr tablet Take 25 mg by mouth daily.     Polyethyl Glycol-Propyl Glycol (SYSTANE OP) Place 2 drops into both eyes daily.     potassium chloride SA (KLOR-CON) 20 MEQ tablet TAKE 2 TABLETS(40 MEQ) BY MOUTH DAILY 60 tablet 0   TURMERIC PO Take 1 capsule by mouth daily as needed (pain/inflammation).      cyclobenzaprine (FLEXERIL) 5 MG tablet Take 1 tablet (5 mg total) by mouth at bedtime as needed for muscle spasms. (Patient not taking: Reported on 08/27/2020) 10 tablet 1   meloxicam (MOBIC) 15 MG tablet Take 1 tablet (15 mg total) by mouth daily. (Patient not taking: Reported on 08/27/2020) 30 tablet 0   No current facility-administered medications for  this visit.    Allergies  Allergen Reactions   Ceftriaxone Sodium Itching   Doxycycline Rash    Social History   Socioeconomic History   Marital status: Divorced    Spouse name: Not on file   Number of children: 2   Years of education: 16   Highest education level: Not on file  Occupational History   Occupation: dialysis tech    Employer: [employer]  Tobacco Use   Smoking status: Never Smoker   Smokeless tobacco: Never Used  Scientific laboratory technician Use: Never used  Substance and Sexual Activity   Alcohol use: No   Drug use: No   Sexual activity: Yes  Other Topics Concern   Not on file  Social History Narrative   [name] is a divorced [place] American female who works as a Engineer, 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 She lived in [place] and in [place] for a number of years her mom is from [place]. 60 years of education went to college at [employer] lives at home with her son who is in his 72s no pets   Neg ets tob etoh hx PA   6 hours of sleep   G2P2    TD2010  colonoscopy 2009   Now running  own business    HH of 2    Left handed    Caffeine use: tea sometimes         Social Determinants of Health   Financial Resource Strain: Not on file  Food Insecurity: Not on file  Transportation Needs: Not on file  Physical Activity: Not on file  Stress: Not on file  Social Connections: Not on file  Intimate Partner Violence: Not on file    Review of Systems  Constitutional: Negative.  Negative for chills and fever.  HENT: Negative.  Negative for congestion and sore throat.   Respiratory: Negative.  Negative for cough and shortness of breath.   Cardiovascular: Negative.  Negative for chest pain and palpitations.  Gastrointestinal: Negative for abdominal pain, nausea and vomiting.  Genitourinary: Negative.  Negative for dysuria and hematuria.  Skin: Negative.  Negative for rash.   Neurological: Negative.  Negative for dizziness and headaches.  All other systems reviewed and are negative.   Objective  Alert and oriented x3 in no apparent respiratory distress Vitals as reported by the patient: Today's Vitals   08/27/20 1648  Weight: 199 lb (90.3 kg)  Height: 5\' 6"  (1.676 m)    Danielle was seen today for medication refill.  Diagnoses and all orders for this visit:  Hypokalemia  Prescription for Klor-Con was sent last week and already picked up by patient.  I discussed the assessment and treatment plan with the patient. The patient was provided an opportunity to ask questions and all were answered. The patient agreed with the plan and demonstrated an understanding of the instructions.   The patient was advised to call back or seek an in-person evaluation if the symptoms worsen or if the condition fails to improve as anticipated.  I provided 10 minutes of non-face-to-face time during this encounter.  Horald Pollen, MD  Primary Care at [facility]

## 2020-08-27 NOTE — Patient Instructions (Signed)
° ° ° °  If you have lab work done today you will be contacted with your lab results within the next 2 weeks.  If you have not heard from us then please contact us. The fastest way to get your results is to register for My Chart. ° ° °IF you received an x-ray today, you will receive an invoice from [facility]. Please contact [facility] at [phone] with questions or concerns regarding your invoice.  ° °IF you received labwork today, you will receive an invoice from [facility]. Please contact [facility] at [phone] with questions or concerns regarding your invoice.  ° °Our billing staff will not be able to assist you with questions regarding bills from these companies. ° °You will be contacted with the lab results as soon as they are available. The fastest way to get your results is to activate your My Chart account. Instructions are located on the last page of this paperwork. If you have not heard from us regarding the results in 2 weeks, please contact this office. °  ° ° ° °

## 2020-09-23 ENCOUNTER — Ambulatory Visit (INDEPENDENT_AMBULATORY_CARE_PROVIDER_SITE_OTHER): Payer: 59 | Admitting: Emergency Medicine

## 2020-09-23 ENCOUNTER — Other Ambulatory Visit: Payer: Self-pay

## 2020-09-23 ENCOUNTER — Encounter: Payer: Self-pay | Admitting: Emergency Medicine

## 2020-09-23 VITALS — BP 125/73 | HR 65 | Temp 98.1°F | Resp 16 | Ht 66.0 in | Wt 203.0 lb

## 2020-09-23 DIAGNOSIS — Z8639 Personal history of other endocrine, nutritional and metabolic disease: Secondary | ICD-10-CM

## 2020-09-23 DIAGNOSIS — I10 Essential (primary) hypertension: Secondary | ICD-10-CM | POA: Diagnosis not present

## 2020-09-23 DIAGNOSIS — G8929 Other chronic pain: Secondary | ICD-10-CM | POA: Diagnosis not present

## 2020-09-23 DIAGNOSIS — M542 Cervicalgia: Secondary | ICD-10-CM

## 2020-09-23 LAB — COMPREHENSIVE METABOLIC PANEL
ALT: 21 IU/L (ref 0–32)
AST: 20 IU/L (ref 0–40)
Albumin/Globulin Ratio: 2.2 (ref 1.2–2.2)
Albumin: 4.3 g/dL (ref 3.8–4.8)
Alkaline Phosphatase: 78 IU/L (ref 44–121)
BUN/Creatinine Ratio: 13 (ref 12–28)
BUN: 10 mg/dL (ref 8–27)
Bilirubin Total: 0.5 mg/dL (ref 0.0–1.2)
CO2: 24 mmol/L (ref 20–29)
Calcium: 9.1 mg/dL (ref 8.7–10.3)
Chloride: 105 mmol/L (ref 96–106)
Creatinine, Ser: 0.79 mg/dL (ref 0.57–1.00)
GFR calc Af Amer: 93 mL/min/{1.73_m2} (ref >59)
GFR calc non Af Amer: 81 mL/min/{1.73_m2} (ref >59)
Globulin, Total: 2 g/dL (ref 1.5–4.5)
Glucose: 90 mg/dL (ref 65–99)
Potassium: 4.1 mmol/L (ref 3.5–5.2)
Sodium: 142 mmol/L (ref 134–144)
Total Protein: 6.3 g/dL (ref 6.0–8.5)

## 2020-09-23 NOTE — Patient Instructions (Addendum)
   If you have lab work done today you will be contacted with your lab results within the next 2 weeks.  If you have not heard from us then please contact us. The fastest way to get your results is to register for My Chart.   IF you received an x-ray today, you will receive an invoice from [facility]. Please contact  Radiology at [phone] with questions or concerns regarding your invoice.   IF you received labwork today, you will receive an invoice from [facility]. Please contact [facility] at [phone] with questions or concerns regarding your invoice.   Our billing staff will not be able to assist you with questions regarding bills from these companies.  You will be contacted with the lab results as soon as they are available. The fastest way to get your results is to activate your My Chart account. Instructions are located on the last page of this paperwork. If you have not heard from us regarding the results in 2 weeks, please contact this office.     Health Maintenance, Female Adopting a healthy lifestyle and getting preventive care are important in promoting health and wellness. Ask your health care provider about:  The right schedule for you to have regular tests and exams.  Things you can do on your own to prevent diseases and keep yourself healthy. What should I know about diet, weight, and exercise? Eat a healthy diet  Eat a diet that includes plenty of vegetables, fruits, low-fat dairy products, and lean protein.  Do not eat a lot of foods that are high in solid fats, added sugars, or sodium.   Maintain a healthy weight Body mass index (BMI) is used to identify weight problems. It estimates body fat based on height and weight. Your health care provider can help determine your BMI and help you achieve or maintain a healthy weight. Get regular exercise Get regular exercise. This is one of the most important things you can do for your health. Most  adults should:  Exercise for at least 150 minutes each week. The exercise should increase your heart rate and make you sweat (moderate-intensity exercise).  Do strengthening exercises at least twice a week. This is in addition to the moderate-intensity exercise.  Spend less time sitting. Even light physical activity can be beneficial. Watch cholesterol and blood lipids Have your blood tested for lipids and cholesterol at 62 years of age, then have this test every 5 years. Have your cholesterol levels checked more often if:  Your lipid or cholesterol levels are high.  You are older than 62 years of age.  You are at high risk for heart disease. What should I know about cancer screening? Depending on your health history and family history, you may need to have cancer screening at various ages. This may include screening for:  Breast cancer.  Cervical cancer.  Colorectal cancer.  Skin cancer.  Lung cancer. What should I know about heart disease, diabetes, and high blood pressure? Blood pressure and heart disease  High blood pressure causes heart disease and increases the risk of stroke. This is more likely to develop in people who have high blood pressure readings, are of African descent, or are overweight.  Have your blood pressure checked: ? Every 3-5 years if you are 18-39 years of age. ? Every year if you are 40 years old or older. Diabetes Have regular diabetes screenings. This checks your fasting blood sugar level. Have the screening done:  Once every   three years after age 40 if you are at a normal weight and have a low risk for diabetes.  More often and at a younger age if you are overweight or have a high risk for diabetes. What should I know about preventing infection? Hepatitis B If you have a higher risk for hepatitis B, you should be screened for this virus. Talk with your health care provider to find out if you are at risk for hepatitis B infection. Hepatitis  C Testing is recommended for:  Everyone born from 1945 through 1965.  Anyone with known risk factors for hepatitis C. Sexually transmitted infections (STIs)  Get screened for STIs, including gonorrhea and chlamydia, if: ? You are sexually active and are younger than 62 years of age. ? You are older than 62 years of age and your health care provider tells you that you are at risk for this type of infection. ? Your sexual activity has changed since you were last screened, and you are at increased risk for chlamydia or gonorrhea. Ask your health care provider if you are at risk.  Ask your health care provider about whether you are at high risk for HIV. Your health care provider may recommend a prescription medicine to help prevent HIV infection. If you choose to take medicine to prevent HIV, you should first get tested for HIV. You should then be tested every 3 months for as long as you are taking the medicine. Pregnancy  If you are about to stop having your period (premenopausal) and you may become pregnant, seek counseling before you get pregnant.  Take 400 to 800 micrograms (mcg) of folic acid every day if you become pregnant.  Ask for birth control (contraception) if you want to prevent pregnancy. Osteoporosis and menopause Osteoporosis is a disease in which the bones lose minerals and strength with aging. This can result in bone fractures. If you are 65 years old or older, or if you are at risk for osteoporosis and fractures, ask your health care provider if you should:  Be screened for bone loss.  Take a calcium or vitamin D supplement to lower your risk of fractures.  Be given hormone replacement therapy (HRT) to treat symptoms of menopause. Follow these instructions at home: Lifestyle  Do not use any products that contain nicotine or tobacco, such as cigarettes, e-cigarettes, and chewing tobacco. If you need help quitting, ask your health care provider.  Do not use street  drugs.  Do not share needles.  Ask your health care provider for help if you need support or information about quitting drugs. Alcohol use  Do not drink alcohol if: ? Your health care provider tells you not to drink. ? You are pregnant, may be pregnant, or are planning to become pregnant.  If you drink alcohol: ? Limit how much you use to 0-1 drink a day. ? Limit intake if you are breastfeeding.  Be aware of how much alcohol is in your drink. In the [place] one drink equals one 12 oz bottle of beer (355 mL), one 5 oz glass of wine (148 mL), or one 1 oz glass of hard liquor (44 mL). General instructions  Schedule regular health, dental, and eye exams.  Stay current with your vaccines.  Tell your health care provider if: ? You often feel depressed. ? You have ever been abused or do not feel safe at home. Summary  Adopting a healthy lifestyle and getting preventive care are important in promoting health and wellness.    Follow your health care provider's instructions about healthy diet, exercising, and getting tested or screened for diseases.  Follow your health care provider's instructions on monitoring your cholesterol and blood pressure. This information is not intended to replace advice given to you by your health care provider. Make sure you discuss any questions you have with your health care provider. Document Revised: 08/23/2018 Document Reviewed: 08/23/2018 Elsevier Patient Education  2021 [place].  Cervical Sprain A cervical sprain is also called a neck sprain. It is a stretch or tear in one or more ligaments in the neck. Ligaments are tissues that connect bones to each other. Neck sprains can be mild, bad, or very bad. A very bad sprain in the neck can cause the bones in the neck to be unstable. This can damage the spinal cord. It can also cause serious problems in the brain, spinal cord, and nerves (nervous system). Most neck sprains heal in 4-6 weeks. It can take  more or less time depending on:  What caused the injury.  The amount of injury. What are the causes? Neck sprains may be caused by trauma, such as:  An injury from an accident in a vehicle such as a car or boat.  A fall.  The head and neck being moved front to back or side to side all of a sudden (whiplash injury). Mild neck sprains may be caused by wear and tear over time. What increases the risk? The following factors may make you more likely to develop this condition:  Taking part in activities that put you at high risk of hurting your neck. These include: ? Contact sports. ? Animator. ? Gymnastics. ? Diving.  Taking risks when driving or riding in a vehicle such as a car or boat.  Arthritis caused by wear and tear of the joints in the spine.  The neck not being very strong or flexible.  Having had a neck injury in the past.  Poor posture.  Spending a lot of time in certain positions that put stress on the neck. This may be from sitting at a computer for a long time. What are the signs or symptoms? Symptoms of this condition include:  Your neck, shoulders, or upper back feeling: ? Painful or sore. ? Stiff. ? Tender. ? Swollen. ? Hot, or like it is burning.  Sudden tightening of neck muscles (spasms).  Not being able to move the neck very much.  Headache.  Feeling dizzy.  Feeling like you may vomit, or vomiting.  Having a hand or arm that: ? Feels weak. ? Loses feeling (feels numb). ? Tingles. You may get symptoms right away after injury, or you may get them over a few days. In some cases, symptoms may go away with treatment and come back over time. How is this treated? This condition is treated by:  Resting your neck.  Icing the part of your neck that is hurt.  Doing exercises to restore movement and strength to your neck (physical therapy). If there is no swelling, you may use heat therapy 2-3 days after the injury took place. If your injury is  very bad, treatment may also include:  Keeping your neck in place for a length of time. This may be done using: ? A neck collar. This supports your chin and the back of your head. ? A cervical traction device. This is a sling that holds up your head. The sling removes weight and pressure from your neck. It may also help  to relieve pain.  Medicines that help with: ? Pain. ? Irritation and swelling (inflammation).  Medicines that help to relax your muscles (muscle relaxants).  Surgery. This is rare. Follow these instructions at home: Medicines  Take over-the-counter and prescription medicines only as told by your doctor.  Ask your doctor if the medicine prescribed to you: ? Requires you to avoid driving or using heavy machinery. ? Can cause trouble pooping (constipation). You may need to take these actions to prevent or treat trouble pooping:  Drink enough fluid to keep your pee (urine) pale yellow.  Take over-the-counter or prescription medicines.  Eat foods that are high in fiber. These include beans, whole grains, and fresh fruits and vegetables.  Limit foods that are high in fat and sugar. These include fried or sweet foods.   If you have a neck collar:  Wear it as told by your doctor. Do not take it off unless told.  Ask your doctor before adjusting your collar.  If you have long hair, keep it outside of the collar.  Ask your doctor if you may take off the collar for cleaning and bathing. If you may take off the collar: ? Follow instructions about how to take it off safely. ? Clean it by hand with mild soap and water. Let it air-dry fully. ? If your collar has pads that you can take out:  Take the pads out every 1-2 days.  Wash them by hand with soap and water.  Let the pads air-dry fully before you put them back in the collar. ? Tell your doctor if your skin under the collar has irritation or sores. Managing pain, stiffness, and swelling  Use a cervical traction  device, if told by your doctor.  If told, put ice on the affected area. To do this: ? Put ice in a plastic bag. ? Place a towel between your skin and the bag. ? Leave the ice on for 20 minutes, 2-3 times a day.  If told, put heat on the affected area. Do this before exercise or as often as told by your doctor. Use the heat source that your doctor recommends, such as a moist heat pack or a heating pad. ? Place a towel between your skin and the heat source. ? Leave the heat on for 20-30 minutes. ? Take the heat off if your skin turns bright red. This is very important if you cannot feel pain, heat, or cold. You may have a greater risk of getting burned.      Activity  Do not drive while wearing a neck collar. If you do not have a neck collar, ask if it is safe to drive while your neck heals.  Do not lift anything that is heavier than 10 lb (4.5 kg), or the limit that you are told, until your doctor tells you that it is safe.  Rest as told by your doctor.  Do exercises as told by your doctor or physical therapist.  Return to your normal activities as told by your doctor. Avoid positions and activities that make you feel worse. Ask your doctor what activities are safe for you. General instructions  Do not use any products that contain nicotine or tobacco, such as cigarettes, e-cigarettes, and chewing tobacco. These can delay healing. If you need help quitting, ask your doctor.  Keep all follow-up visits as told by your doctor or physical therapist. This is important. How is this prevented? To prevent a neck sprain from happening  again:  Practice good posture. Adjust your workstation to help you do this.  Exercise regularly as told by your doctor or physical therapist.  Avoid activities that are risky or may cause a neck sprain. Contact a doctor if:  Your symptoms get worse.  Your symptoms do not get better after 2 weeks of treatment.  Your pain gets worse.  Medicine does not  help your pain.  You have new symptoms that you cannot explain.  Your neck collar gives you sores on your skin or bothers your skin. Get help right away if:  You have very bad pain.  You get any of the following in any part of your body: ? Loss of feeling. ? Tingling. ? Weakness.  You cannot move a part of your body.  You have neck pain and either of these: ? Very bad dizziness. ? A very bad headache. Summary  A cervical sprain is also called a neck sprain. It is a stretch or tear in one or more ligaments in the neck. Ligaments are tissues that connect bones.  Neck sprains may be caused by trauma, such as an injury or a fall.  You may get symptoms right away after injury, or you may get them over a few days.  Neck sprains may be treated with rest, heat, ice, medicines, exercise, and surgery. This information is not intended to replace advice given to you by your health care provider. Make sure you discuss any questions you have with your health care provider. Document Revised: 05/09/2019 Document Reviewed: 05/09/2019 Elsevier Patient Education  [place].

## 2020-09-23 NOTE — Progress Notes (Signed)
[name] 60 y.o.   Chief Complaint  Patient presents with  . Headache    And neck pain on the right side for 3 month  . Dizziness    Per patient with nausea sometimes    HISTORY OF PRESENT ILLNESS: This is a 60 y.o. female complaining of chronic neck pain for the past 6 months.  Has history of degenerative disc disease of cervical spine diagnosed in the 90s.  Occasional dizziness with nausea but no other associated symptoms. History of hypertension on losartan.  History of hypokalemia on Klor-Con.  Needs blood work today. Otherwise doing well.  No other complaints or medical concerns.  HPI   Prior to Admission medications   Medication Sig Start Date End Date Taking? Authorizing Provider  Ascorbic Acid (VITAMIN C) 1000 MG tablet Take 2,000 mg by mouth at bedtime.   Yes [provider]  aspirin EC 81 MG EC tablet Take 1 tablet (81 mg total) by mouth daily. 10/21/19  Yes Dixie Dials, MD  Calcium Carb-Cholecalciferol (OYSTER SHELL CALCIUM 250+D PO) Take 2 tablets by mouth at bedtime.   Yes [provider]  Cholecalciferol (VITAMIN D) 50 MCG (2000 UT) tablet Take 4,000 Units by mouth at bedtime.    Yes [provider]  cyclobenzaprine (FLEXERIL) 5 MG tablet Take 1 tablet (5 mg total) by mouth at bedtime as needed for muscle spasms. 07/17/20  Yes Just, Laurita Quint, FNP  fluticasone (FLONASE) 50 MCG/ACT nasal spray Place 1 spray into both nostrils at bedtime as needed for allergies or rhinitis.    Yes [provider]  loratadine (CLARITIN) 10 MG tablet Take 10 mg by mouth daily as needed for allergies or rhinitis.    Yes [provider]  losartan (COZAAR) 50 MG tablet Take 25 mg by mouth 2 (two) times daily.    Yes [provider]  metoprolol succinate (TOPROL-XL) 25 MG 24 hr tablet Take 25 mg by mouth 2 (two) times daily.    Yes [provider]  Multiple Vitamins-Minerals (ZINC PO) Take 1 tablet by mouth at bedtime.    Yes [provider]  PARoxetine (PAXIL-CR) 25 MG 24 hr tablet Take 25 mg by mouth daily.   Yes [provider]  Polyethyl Glycol-Propyl Glycol (SYSTANE OP) Place 2 drops into both eyes daily.   Yes [provider]  potassium chloride SA (KLOR-CON) 20 MEQ tablet TAKE 2 TABLETS(40 MEQ) BY MOUTH DAILY 08/12/20  Yes Seymour Pavlak, Ines Bloomer, MD  TURMERIC PO Take 1 capsule by mouth daily as needed (pain/inflammation).    Yes [provider]  meloxicam (MOBIC) 15 MG tablet Take 1 tablet (15 mg total) by mouth daily. Patient not taking: Reported on 09/23/2020 07/17/20   Just, Laurita Quint, FNP  OVER THE COUNTER MEDICATION Take 1 capsule by mouth daily. Soothe and Heal for joint pain  Patient not taking: Reported on 09/23/2020    [provider]    Allergies  Allergen Reactions  . Ceftriaxone Sodium Itching  . Doxycycline Rash    Patient Active Problem List   Diagnosis Date Noted  . Stress incontinence of urine 10/31/2018  . History of diverticulosis 05/04/2017  . History of PSVT (paroxysmal supraventricular tachycardia) 06/24/2012  . Multinodular goiter 05/12/2012  . SLEEP APNEA 01/19/2008  . IRRITABLE BOWEL SYNDROME 01/18/2008    Past Medical History:  Diagnosis Date  . Anemia   . Anxiety   . Colon polyp 08/28/2012   Tubular adenoma  . Dense breast   .  Depression   . Genital warts   . GERD (gastroesophageal reflux disease)   . Heart murmur   . Hemorrhoids   . Hx of cardiac catheterization 2009   normal coronary arteries  . Hx of menorrhagia    Novasure  . Hypokalemia   . Hypomagnesemia   . Hypophosphatemia   . Lactose intolerance in adult   . Meniscus degeneration, right    torn r knee/ 10/2019  . Mitral valve prolapse   . Normal cardiac stress test 03/09/2012   no ischemia  . Positive PPD   . Rapid heart beat    States "irreg heart beat"  . Thyroid disease    Thyroid nodules    Past Surgical History:  Procedure Laterality Date   . CARDIAC CATHETERIZATION     x2 with normal results per pt  . NOVASURE ABLATION    . UPPER GASTROINTESTINAL ENDOSCOPY  May 2013    Social History   Socioeconomic History  . Marital status: Divorced    Spouse name: Not on file  . Number of children: 2  . Years of education: 36  . Highest education level: Not on file  Occupational History  . Occupation: dialysis Engineer, production: [employer]  Tobacco Use  . Smoking status: Never Smoker  . Smokeless tobacco: Never Used  Vaping Use  . Vaping Use: Never used  Substance and Sexual Activity  . Alcohol use: No  . Drug use: No  . Sexual activity: Yes  Other Topics Concern  . Not on file  Social History Narrative   Ms. 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 She lived in [place] and in [place] for a number of years her mom is from [place]. 96 years of education went to college at [employer] lives at home with her son who is in his 40s no pets   Neg ets tob etoh hx PA   6 hours of sleep   G2P2    TD2010  colonoscopy 2009   Now running  own business    HH of 2    Left handed    Caffeine use: tea sometimes         Social Determinants of Health   Financial Resource Strain: Not on file  Food Insecurity: Not on file  Transportation Needs: Not on file  Physical Activity: Not on file  Stress: Not on file  Social Connections: Not on file  Intimate Partner Violence: Not on file    Family History  Problem Relation Age of Onset  . Heart disease Father        died Mi 38  . Hypertension Maternal Grandfather   . Diabetes Maternal Grandfather   . Heart disease Maternal Grandfather   . Colon cancer Maternal Grandfather   . Rectal cancer Maternal Grandfather   . Diabetes Maternal Grandmother   . Prostate cancer Neg Hx   . Breast cancer Neg Hx      Review of Systems  Constitutional: Negative.   Negative for chills and fever.  HENT: Negative.  Negative for congestion and sore throat.   Respiratory: Negative.  Negative for cough, hemoptysis and shortness of breath.   Cardiovascular: Negative.  Negative for chest pain and palpitations.  Gastrointestinal: Negative for abdominal pain, diarrhea, nausea and vomiting.  Genitourinary: Negative.  Negative for dysuria and hematuria.  Musculoskeletal: Positive for neck pain.  Skin: Negative.  Negative for rash.  Neurological: Negative.  Negative for dizziness and headaches.  All other systems reviewed and are negative.  Today's Vitals   09/23/20 0916  BP: 125/73  Pulse: 65  Resp: 16  Temp: 98.1 F (36.7 C)  TempSrc: Temporal  SpO2: 97%  Weight: 203 lb (92.1 kg)  Height: 5\' 6"  (1.676 m)   Body mass index is 32.77 kg/m. Wt Readings from Last 3 Encounters:  09/23/20 203 lb (92.1 kg)  08/27/20 199 lb (90.3 kg)  07/17/20 210 lb (95.3 kg)     Physical Exam Vitals reviewed.  Constitutional:      Appearance: She is well-developed.  HENT:     Head: Normocephalic.  Eyes:     Extraocular Movements: Extraocular movements intact.     Conjunctiva/sclera: Conjunctivae normal.     Pupils: Pupils are equal, round, and reactive to light.  Cardiovascular:     Rate and Rhythm: Normal rate and regular rhythm.     Pulses: Normal pulses.     Heart sounds: Normal heart sounds.  Pulmonary:     Effort: Pulmonary effort is normal.     Breath sounds: Normal breath sounds.  Musculoskeletal:        General: Normal range of motion.     Cervical back: No tenderness. Muscular tenderness present. No spinous process tenderness.  Lymphadenopathy:     Cervical: No cervical adenopathy.  Skin:    General: Skin is warm and dry.     Capillary Refill: Capillary refill takes less than 2 seconds.  Neurological:     General: No focal deficit present.     Mental Status: She is alert and oriented to person, place, and time.  Psychiatric:        Mood and  Affect: Mood normal.        Behavior: Behavior normal.      ASSESSMENT & PLAN: Clinically stable.  No medical concerns identified during this visit. Continue present medications.  No changes. Donnesha was seen today for headache and dizziness.  Diagnoses and all orders for this visit:  Essential hypertension -     Comprehensive metabolic panel  History of hypokalemia -     Comprehensive metabolic panel  Chronic neck pain -     MR Cervical Spine Wo Contrast; Future    Patient Instructions       If you have lab work done today you will be contacted with your lab results within the next 2 weeks.  If you have not heard from [place] then please contact us. The fastest way to get your results is to register for My Chart.   IF you received an x-ray today, you will receive an invoice from [facility]. Please contact [facility] at [phone] with questions or concerns regarding your invoice.   IF you received labwork today, you will receive an invoice from [facility]. Please contact [facility] at [phone] with questions or concerns regarding your invoice.   Our billing staff will not be able to assist you with questions regarding bills from these companies.  You will be contacted with the lab results as soon as they are available. The fastest way to get your results is to activate your My Chart account. Instructions are located on the last page of this paperwork. If you have not heard from [place] regarding the results in 2 weeks, please contact this office.     Health Maintenance, Female Adopting a healthy lifestyle and getting preventive care are important in  promoting health and wellness. Ask your health care provider about:  The right schedule for you to have regular tests and exams.  Things you can do on your own to prevent diseases and keep yourself healthy. What should I know about diet, weight, and exercise? Eat a healthy diet  Eat a diet that includes  plenty of vegetables, fruits, low-fat dairy products, and lean protein.  Do not eat a lot of foods that are high in solid fats, added sugars, or sodium.   Maintain a healthy weight Body mass index (BMI) is used to identify weight problems. It estimates body fat based on height and weight. Your health care provider can help determine your BMI and help you achieve or maintain a healthy weight. Get regular exercise Get regular exercise. This is one of the most important things you can do for your health. Most adults should:  Exercise for at least 150 minutes each week. The exercise should increase your heart rate and make you sweat (moderate-intensity exercise).  Do strengthening exercises at least twice a week. This is in addition to the moderate-intensity exercise.  Spend less time sitting. Even light physical activity can be beneficial. Watch cholesterol and blood lipids Have your blood tested for lipids and cholesterol at 62 years of age, then have this test every 5 years. Have your cholesterol levels checked more often if:  Your lipid or cholesterol levels are high.  You are older than 62 years of age.  You are at high risk for heart disease. What should I know about cancer screening? Depending on your health history and family history, you may need to have cancer screening at various ages. This may include screening for:  Breast cancer.  Cervical cancer.  Colorectal cancer.  Skin cancer.  Lung cancer. What should I know about heart disease, diabetes, and high blood pressure? Blood pressure and heart disease  High blood pressure causes heart disease and increases the risk of stroke. This is more likely to develop in people who have high blood pressure readings, are of African descent, or are overweight.  Have your blood pressure checked: ? Every 3-5 years if you are 51-75 years of age. ? Every year if you are 27 years old or older. Diabetes Have regular diabetes  screenings. This checks your fasting blood sugar level. Have the screening done:  Once every three years after age 4 if you are at a normal weight and have a low risk for diabetes.  More often and at a younger age if you are overweight or have a high risk for diabetes. What should I know about preventing infection? Hepatitis B If you have a higher risk for hepatitis B, you should be screened for this virus. Talk with your health care provider to find out if you are at risk for hepatitis B infection. Hepatitis C Testing is recommended for:  Everyone born from [birth year] through 1967.  Anyone with known risk factors for hepatitis C. Sexually transmitted infections (STIs)  Get screened for STIs, including gonorrhea and chlamydia, if: ? You are sexually active and are younger than 62 years of age. ? You are older than 62 years of age and your health care provider tells you that you are at risk for this type of infection. ? Your sexual activity has changed since you were last screened, and you are at increased risk for chlamydia or gonorrhea. Ask your health care provider if you are at risk.  Ask your health care provider about whether  you are at high risk for HIV. Your health care provider may recommend a prescription medicine to help prevent HIV infection. If you choose to take medicine to prevent HIV, you should first get tested for HIV. You should then be tested every 3 months for as long as you are taking the medicine. Pregnancy  If you are about to stop having your period (premenopausal) and you may become pregnant, seek counseling before you get pregnant.  Take 400 to 800 micrograms (mcg) of folic acid every day if you become pregnant.  Ask for birth control (contraception) if you want to prevent pregnancy. Osteoporosis and menopause Osteoporosis is a disease in which the bones lose minerals and strength with aging. This can result in bone fractures. If you are 66 years old or older, or if  you are at risk for osteoporosis and fractures, ask your health care provider if you should:  Be screened for bone loss.  Take a calcium or vitamin D supplement to lower your risk of fractures.  Be given hormone replacement therapy (HRT) to treat symptoms of menopause. Follow these instructions at home: Lifestyle  Do not use any products that contain nicotine or tobacco, such as cigarettes, e-cigarettes, and chewing tobacco. If you need help quitting, ask your health care provider.  Do not use street drugs.  Do not share needles.  Ask your health care provider for help if you need support or information about quitting drugs. Alcohol use  Do not drink alcohol if: ? Your health care provider tells you not to drink. ? You are pregnant, may be pregnant, or are planning to become pregnant.  If you drink alcohol: ? Limit how much you use to 0-1 drink a day. ? Limit intake if you are breastfeeding.  Be aware of how much alcohol is in your drink. In the [place] one drink equals one 12 oz bottle of beer (355 mL), one 5 oz glass of wine (148 mL), or one 1 oz glass of hard liquor (44 mL). General instructions  Schedule regular health, dental, and eye exams.  Stay current with your vaccines.  Tell your health care provider if: ? You often feel depressed. ? You have ever been abused or do not feel safe at home. Summary  Adopting a healthy lifestyle and getting preventive care are important in promoting health and wellness.  Follow your health care provider's instructions about healthy diet, exercising, and getting tested or screened for diseases.  Follow your health care provider's instructions on monitoring your cholesterol and blood pressure. This information is not intended to replace advice given to you by your health care provider. Make sure you discuss any questions you have with your health care provider. Document Revised: 08/23/2018 Document Reviewed: 08/23/2018 Elsevier  Patient Education  2021 [place].  Cervical Sprain A cervical sprain is also called a neck sprain. It is a stretch or tear in one or more ligaments in the neck. Ligaments are tissues that connect bones to each other. Neck sprains can be mild, bad, or very bad. A very bad sprain in the neck can cause the bones in the neck to be unstable. This can damage the spinal cord. It can also cause serious problems in the brain, spinal cord, and nerves (nervous system). Most neck sprains heal in 4-6 weeks. It can take more or less time depending on:  What caused the injury.  The amount of injury. What are the causes? Neck sprains may be caused by trauma, such as:  An injury from an accident in a vehicle such as a car or boat.  A fall.  The head and neck being moved front to back or side to side all of a sudden (whiplash injury). Mild neck sprains may be caused by wear and tear over time. What increases the risk? The following factors may make you more likely to develop this condition:  Taking part in activities that put you at high risk of hurting your neck. These include: ? Contact sports. ? Animator. ? Gymnastics. ? Diving.  Taking risks when driving or riding in a vehicle such as a car or boat.  Arthritis caused by wear and tear of the joints in the spine.  The neck not being very strong or flexible.  Having had a neck injury in the past.  Poor posture.  Spending a lot of time in certain positions that put stress on the neck. This may be from sitting at a computer for a long time. What are the signs or symptoms? Symptoms of this condition include:  Your neck, shoulders, or upper back feeling: ? Painful or sore. ? Stiff. ? Tender. ? Swollen. ? Hot, or like it is burning.  Sudden tightening of neck muscles (spasms).  Not being able to move the neck very much.  Headache.  Feeling dizzy.  Feeling like you may vomit, or vomiting.  Having a hand or arm  that: ? Feels weak. ? Loses feeling (feels numb). ? Tingles. You may get symptoms right away after injury, or you may get them over a few days. In some cases, symptoms may go away with treatment and come back over time. How is this treated? This condition is treated by:  Resting your neck.  Icing the part of your neck that is hurt.  Doing exercises to restore movement and strength to your neck (physical therapy). If there is no swelling, you may use heat therapy 2-3 days after the injury took place. If your injury is very bad, treatment may also include:  Keeping your neck in place for a length of time. This may be done using: ? A neck collar. This supports your chin and the back of your head. ? A cervical traction device. This is a sling that holds up your head. The sling removes weight and pressure from your neck. It may also help to relieve pain.  Medicines that help with: ? Pain. ? Irritation and swelling (inflammation).  Medicines that help to relax your muscles (muscle relaxants).  Surgery. This is rare. Follow these instructions at home: Medicines  Take over-the-counter and prescription medicines only as told by your doctor.  Ask your doctor if the medicine prescribed to you: ? Requires you to avoid driving or using heavy machinery. ? Can cause trouble pooping (constipation). You may need to take these actions to prevent or treat trouble pooping:  Drink enough fluid to keep your pee (urine) pale yellow.  Take over-the-counter or prescription medicines.  Eat foods that are high in fiber. These include beans, whole grains, and fresh fruits and vegetables.  Limit foods that are high in fat and sugar. These include fried or sweet foods.   If you have a neck collar:  Wear it as told by your doctor. Do not take it off unless told.  Ask your doctor before adjusting your collar.  If you have long hair, keep it outside of the collar.  Ask your doctor if you may take  off the collar for cleaning and  bathing. If you may take off the collar: ? Follow instructions about how to take it off safely. ? Clean it by hand with mild soap and water. Let it air-dry fully. ? If your collar has pads that you can take out:  Take the pads out every 1-2 days.  Wash them by hand with soap and water.  Let the pads air-dry fully before you put them back in the collar. ? Tell your doctor if your skin under the collar has irritation or sores. Managing pain, stiffness, and swelling  Use a cervical traction device, if told by your doctor.  If told, put ice on the affected area. To do this: ? Put ice in a plastic bag. ? Place a towel between your skin and the bag. ? Leave the ice on for 20 minutes, 2-3 times a day.  If told, put heat on the affected area. Do this before exercise or as often as told by your doctor. Use the heat source that your doctor recommends, such as a moist heat pack or a heating pad. ? Place a towel between your skin and the heat source. ? Leave the heat on for 20-30 minutes. ? Take the heat off if your skin turns bright red. This is very important if you cannot feel pain, heat, or cold. You may have a greater risk of getting burned.      Activity  Do not drive while wearing a neck collar. If you do not have a neck collar, ask if it is safe to drive while your neck heals.  Do not lift anything that is heavier than 10 lb (4.5 kg), or the limit that you are told, until your doctor tells you that it is safe.  Rest as told by your doctor.  Do exercises as told by your doctor or physical therapist.  Return to your normal activities as told by your doctor. Avoid positions and activities that make you feel worse. Ask your doctor what activities are safe for you. General instructions  Do not use any products that contain nicotine or tobacco, such as cigarettes, e-cigarettes, and chewing tobacco. These can delay healing. If you need help quitting, ask  your doctor.  Keep all follow-up visits as told by your doctor or physical therapist. This is important. How is this prevented? To prevent a neck sprain from happening again:  Practice good posture. Adjust your workstation to help you do this.  Exercise regularly as told by your doctor or physical therapist.  Avoid activities that are risky or may cause a neck sprain. Contact a doctor if:  Your symptoms get worse.  Your symptoms do not get better after 2 weeks of treatment.  Your pain gets worse.  Medicine does not help your pain.  You have new symptoms that you cannot explain.  Your neck collar gives you sores on your skin or bothers your skin. Get help right away if:  You have very bad pain.  You get any of the following in any part of your body: ? Loss of feeling. ? Tingling. ? Weakness.  You cannot move a part of your body.  You have neck pain and either of these: ? Very bad dizziness. ? A very bad headache. Summary  A cervical sprain is also called a neck sprain. It is a stretch or tear in one or more ligaments in the neck. Ligaments are tissues that connect bones.  Neck sprains may be caused by trauma, such as an injury or  a fall.  You may get symptoms right away after injury, or you may get them over a few days.  Neck sprains may be treated with rest, heat, ice, medicines, exercise, and surgery. This information is not intended to replace advice given to you by your health care provider. Make sure you discuss any questions you have with your health care provider. Document Revised: 05/09/2019 Document Reviewed: 05/09/2019 Elsevier Patient Education  2021 Elsevier Inc.      Agustina Caroli, MD [facility]

## 2020-10-04 ENCOUNTER — Other Ambulatory Visit: Payer: 59

## 2020-10-11 ENCOUNTER — Other Ambulatory Visit: Payer: 59

## 2020-10-13 ENCOUNTER — Other Ambulatory Visit: Payer: Self-pay | Admitting: Emergency Medicine

## 2020-10-13 ENCOUNTER — Other Ambulatory Visit: Payer: Self-pay

## 2020-10-13 ENCOUNTER — Ambulatory Visit
Admission: RE | Admit: 2020-10-13 | Discharge: 2020-10-13 | Disposition: A | Discharge status: Home / Self Care | Payer: 59 | Source: Ambulatory Visit | Attending: Emergency Medicine | Admitting: Emergency Medicine

## 2020-10-13 ENCOUNTER — Telehealth: Payer: Self-pay | Admitting: Emergency Medicine

## 2020-10-13 ENCOUNTER — Encounter: Payer: Self-pay | Admitting: Emergency Medicine

## 2020-10-13 ENCOUNTER — Telehealth (INDEPENDENT_AMBULATORY_CARE_PROVIDER_SITE_OTHER): Payer: 59 | Admitting: Emergency Medicine

## 2020-10-13 ENCOUNTER — Telehealth: Payer: Self-pay

## 2020-10-13 VITALS — Ht 66.0 in | Wt 199.0 lb

## 2020-10-13 DIAGNOSIS — G8929 Other chronic pain: Secondary | ICD-10-CM

## 2020-10-13 DIAGNOSIS — M5030 Other cervical disc degeneration, unspecified cervical region: Secondary | ICD-10-CM | POA: Diagnosis not present

## 2020-10-13 DIAGNOSIS — M542 Cervicalgia: Secondary | ICD-10-CM

## 2020-10-13 MED ORDER — TRAMADOL HCL 50 MG PO TABS: 50.0000 mg | ORAL_TABLET | Freq: Three times a day (TID) | ORAL | 0 refills | Status: AC | PRN

## 2020-10-13 MED ORDER — MELOXICAM 15 MG PO TABS: 15.0000 mg | ORAL_TABLET | Freq: Every day | ORAL | 0 refills | Status: DC

## 2020-10-13 NOTE — Patient Instructions (Signed)
° ° ° °  If you have lab work done today you will be contacted with your lab results within the next 2 weeks.  If you have not heard from us then please contact us. The fastest way to get your results is to register for My Chart. ° ° °IF you received an x-ray today, you will receive an invoice from [facility]. Please contact [facility] at [phone] with questions or concerns regarding your invoice.  ° °IF you received labwork today, you will receive an invoice from [facility]. Please contact [facility] at [phone] with questions or concerns regarding your invoice.  ° °Our billing staff will not be able to assist you with questions regarding bills from these companies. ° °You will be contacted with the lab results as soon as they are available. The fastest way to get your results is to activate your My Chart account. Instructions are located on the last page of this paperwork. If you have not heard from us regarding the results in 2 weeks, please contact this office. °  ° ° ° °

## 2020-10-13 NOTE — Telephone Encounter (Signed)
I have called pt and gave her the lab results and she stated that she wanted to go to [facility]. Please assist with sending the referral there.   Thanks.

## 2020-10-13 NOTE — Telephone Encounter (Signed)
I already responded to the results.  I sent a message to the clinical pool to call her and inform about results.  I also sent her a message through my chart.  She needs neurosurgical evaluation.  Referral was placed today.  Thanks.

## 2020-10-13 NOTE — Telephone Encounter (Signed)
Opal Sidles from [facility] calling regarding MRI of spine Edema in the R C1 and C2 Latter masses with small R alnto axel effusion This finding may be degenerative/inflammatory  in endology Correlate clinically to exclude signs or symptoms to suggest infection.  Good call back number: [phone]

## 2020-10-13 NOTE — Progress Notes (Signed)
Patient need a referral for [facility] instead of Neurosurgeon

## 2020-10-13 NOTE — Telephone Encounter (Signed)
Call from [facility]

## 2020-10-13 NOTE — Progress Notes (Signed)
Telemedicine Encounter- SOAP NOTE Established Patient Patient: Home  Provider: Office     This telephone encounter was conducted with the patient's (or proxy's) verbal consent via audio telecommunications: yes/no: Yes Patient was instructed to have this encounter in a suitably private space; and to only have persons present to whom they give permission to participate. In addition, patient identity was confirmed by use of name plus two identifiers (DOB and address).  I discussed the limitations, risks, security and privacy concerns of performing an evaluation and management service by telephone and the availability of in person appointments. I also discussed with the patient that there may be a patient responsible charge related to this service. The patient expressed understanding and agreed to proceed.  I spent a total of TIME; 0 MIN TO 60 MIN: 20 minutes talking with the patient or their proxy.  Chief Complaint  Patient presents with  . DDD    Need a pain Med for her DDD in neck. Patient was last seen on 09/23/2020. MRI done today. Wants to be schedule with [facility]     Subjective   [name] is a 60 y.o. female established patient. Telephone visit today complaining of chronic pain to her neck.  Requesting pain medication.  Had cervical spine MRI done today.  Results reviewed with patient.  Needs neurosurgical evaluation. Denies neurological symptoms to upper extremities.  HPI   Patient Active Problem List   Diagnosis Date Noted  . Stress incontinence of urine 10/31/2018  . History of diverticulosis 05/04/2017  . History of PSVT (paroxysmal supraventricular tachycardia) 06/24/2012  . Multinodular goiter 05/12/2012  . SLEEP APNEA 01/19/2008  . IRRITABLE BOWEL SYNDROME 01/18/2008    Past Medical History:  Diagnosis Date  . Anemia   . Anxiety   . Colon polyp 08/28/2012   Tubular adenoma  . Dense breast   . Depression   . Genital warts   . GERD  (gastroesophageal reflux disease)   . Heart murmur   . Hemorrhoids   . Hx of cardiac catheterization 2009   normal coronary arteries  . Hx of menorrhagia    Novasure  . Hypokalemia   . Hypomagnesemia   . Hypophosphatemia   . Lactose intolerance in adult   . Meniscus degeneration, right    torn r knee/ 10/2019  . Mitral valve prolapse   . Normal cardiac stress test 03/09/2012   no ischemia  . Positive PPD   . Rapid heart beat    States "irreg heart beat"  . Thyroid disease    Thyroid nodules    Current Outpatient Medications  Medication Sig Dispense Refill  . Ascorbic Acid (VITAMIN C) 1000 MG tablet Take 2,000 mg by mouth at bedtime.    [name] aspirin EC 81 MG EC tablet Take 1 tablet (81 mg total) by mouth daily.    . Calcium Carb-Cholecalciferol (OYSTER SHELL CALCIUM 250+D PO) Take 2 tablets by mouth at bedtime.    . Cholecalciferol (VITAMIN D) 50 MCG (2000 UT) tablet Take 4,000 Units by mouth at bedtime.     . cyclobenzaprine (FLEXERIL) 5 MG tablet Take 1 tablet (5 mg total) by mouth at bedtime as needed for muscle spasms. 10 tablet 1  . fluticasone (FLONASE) 50 MCG/ACT nasal spray Place 1 spray into both nostrils at bedtime as needed for allergies or rhinitis.     [name] loratadine (CLARITIN) 10 MG tablet Take 10 mg by mouth daily as needed for allergies or rhinitis.     [name]  losartan (COZAAR) 50 MG tablet Take 25 mg by mouth 2 (two) times daily.     . meloxicam (MOBIC) 15 MG tablet Take 1 tablet (15 mg total) by mouth daily. (Patient not taking: Reported on 09/23/2020) 30 tablet 0  . metoprolol succinate (TOPROL-XL) 25 MG 24 hr tablet Take 25 mg by mouth 2 (two) times daily.     . Multiple Vitamins-Minerals (ZINC PO) Take 1 tablet by mouth at bedtime.    [name] OVER THE COUNTER MEDICATION Take 1 capsule by mouth daily. Soothe and Heal for joint pain  (Patient not taking: Reported on 09/23/2020)    . PARoxetine (PAXIL-CR) 25 MG 24 hr tablet Take 25 mg by mouth daily.    Vladimir Faster Glycol-Propyl  Glycol (SYSTANE OP) Place 2 drops into both eyes daily.    . potassium chloride SA (KLOR-CON) 20 MEQ tablet TAKE 2 TABLETS(40 MEQ) BY MOUTH DAILY 60 tablet 0  . TURMERIC PO Take 1 capsule by mouth daily as needed (pain/inflammation).      No current facility-administered medications for this visit.    Allergies  Allergen Reactions  . Ceftriaxone Sodium Itching  . Doxycycline Rash    Social History   Socioeconomic History  . Marital status: Divorced    Spouse name: Not on file  . Number of children: 2  . Years of education: 79  . Highest education level: Not on file  Occupational History  . Occupation: dialysis Engineer, production: [employer]  Tobacco Use  . Smoking status: Never Smoker  . Smokeless tobacco: Never Used  Vaping Use  . Vaping Use: Never used  Substance and Sexual Activity  . Alcohol use: No  . Drug use: No  . Sexual activity: Yes  Other Topics Concern  . Not on file  Social History Narrative   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 She lived in [place] and in [place] for a number of years her mom is from [place]. 62 years of education went to college at [employer] lives at home with her son who is in his 47s no pets   Neg ets tob etoh hx PA   6 hours of sleep   G2P2    TD2010  colonoscopy 2009   Now running  own business    HH of 2    Left handed    Caffeine use: tea sometimes         Social Determinants of Health   Financial Resource Strain: Not on file  Food Insecurity: Not on file  Transportation Needs: Not on file  Physical Activity: Not on file  Stress: Not on file  Social Connections: Not on file  Intimate Partner Violence: Not on file    Review of Systems  Constitutional: Negative.  Negative for chills and fever.  HENT: Negative.  Negative for congestion and sore throat.   Respiratory: Negative.   Negative for shortness of breath.   Cardiovascular: Negative.  Negative for chest pain and palpitations.  Gastrointestinal: Negative.  Negative for abdominal pain, nausea and vomiting.  Genitourinary: Negative.  Negative for dysuria and hematuria.  Musculoskeletal: Positive for neck pain.  Skin: Negative.  Negative for rash.  Neurological: Negative.  Negative for dizziness, tingling, sensory change, focal weakness and headaches.  All other systems reviewed and are negative.  MR Cervical Spine Wo Contrast  Result Date: 10/13/2020  CLINICAL DATA:  Chronic neck pain. Neck pain, chronic, degenerative changes on x-ray. Additional history provided by scanning technologist: Patient reports right-sided neck pain/ "popping" sound when turning neck, difficulty turning head to the right and left, pain for 3 months but progressively worsening. EXAM: MRI CERVICAL SPINE WITHOUT CONTRAST TECHNIQUE: Multiplanar, multisequence MR imaging of the cervical spine was performed. No intravenous contrast was administered. COMPARISON:  Report from MRI of the cervical spine 06/22/2002 (images unavailable) FINDINGS: Multiple sequences are significantly motion degraded, limiting evaluation. Most notably, there is mild-to-moderate motion degradation of the sagittal T2 weighted and sagittal T1 weighted sequences and severe motion degradation of the axial T2 weighted and axial T2 GRE this. Difficult limits evaluation. Most notably, this precludes adequate quantification of neural foraminal stenosis. Alignment: Nonspecific reversal of the expected cervical lordosis. Trace C5-C6 and C6-C7 grade 1 retrolisthesis. Vertebrae: Vertebral body height is maintained. Marrow edema is present within the right C1 and C2 lateral masses (for instance as seen on series 7, images 2 and 3). Associated small right atlantoaxial joint effusion. No significant marrow edema identified elsewhere. Cord: Motion degradation precludes adequate evaluation for  spinal cord signal abnormality. Posterior Fossa, vertebral arteries, paraspinal tissues: Partially empty sella turcica. No acute abnormality identified within included portions of the posterior fossa. Motion degradation precludes adequate evaluation of the cervical vertebral artery flow voids. 8 mm right thyroid lobe nodule not meeting consensus criteria for ultrasound follow-up. Disc levels: Multilevel disc degeneration. Most notably, there is moderate disc degeneration at C5-C6 and C6-C7. C2-C3: No significant disc herniation or stenosis. C3-C4: Central posterior annular fissure. Disc bulge. Superimposed small central disc protrusion. Mild endplate spurring and facet arthrosis. The disc protrusion contributes to mild spinal canal stenosis, contacting the ventral spinal cord. C4-C5: Disc bulge with endplate spurring. Superimposed small central disc protrusion. Facet hypertrophy. The disc protrusion contributes to mild spinal canal stenosis, contacting the ventral spinal cord. No appreciable significant foraminal stenosis. C5-C6: Trace grade 1 retrolisthesis. Disc bulge with endplate spurring and uncovertebral hypertrophy. A superimposed small cranially migrated central disc extrusion is questioned (series 5, image 7). Facet hypertrophy. Mild/moderate spinal canal stenosis. Contact upon the ventral spinal cord with mild flattening. Suspected mild/moderate right and moderate left neural foraminal narrowing. C6-C7: Trace grade 1 retrolisthesis. Disc bulge. Right greater than left disc osteophyte ridge/uncinate hypertrophy. Facet hypertrophy. Mild spinal canal stenosis with probable contact upon the ventral spinal cord. Suspected moderate/severe bilateral neural foraminal narrowing (greater on the left). C7-T1: Facet hypertrophy. No significant disc herniation or stenosis. Impression 2 will be called to the ordering clinician or representative by the Radiologist Assistant, and communication documented in the PACS or  [employer]. IMPRESSION: Significantly motion degraded examination as described and limiting evaluation. Edema within the right C1 and C2 lateral masses with small right atlantoaxial effusion. These findings may be degenerative/inflammatory in etiology. However, correlate clinically to exclude any signs or symptoms to suggest infection. Cervical spondylosis as outlined. Multilevel spinal canal stenosis. Most notably at C5-C6, there is multifactorial mild/moderate spinal canal stenosis with contact upon the ventral spinal cord and mild spinal cord flattening. Mild spinal canal stenosis and contact upon the ventral spinal cord also present at C3-C4, C4-C5 and C6-C7. Multilevel neural foraminal narrowing as detailed and greatest bilaterally at C5-C6 (mild/moderate right, moderate left) and C6-C7 (moderate/severe). Electronically Signed   By: Jackey Loge DO   On: 10/13/2020 13:27    Objective  Alert and oriented x3 in no apparent respiratory distress. Vitals as reported by the patient:  Today's Vitals   10/13/20 1654  Weight: 199 lb (90.3 kg)  Height: 5\' 6"  (1.676 m)    Anastashia was seen today for ddd.  Diagnoses and all orders for this visit:  Degenerative disc disease, cervical -     Cancel: Ambulatory referral to Neurology  Neck pain -     meloxicam (MOBIC) 15 MG tablet; Take 1 tablet (15 mg total) by mouth daily. -     traMADol (ULTRAM) 50 MG tablet; Take 1 tablet (50 mg total) by mouth every 8 (eight) hours as needed for up to 5 days.  Clinically stable.  Patient referred to neurosurgical service for evaluation. Analgesics as needed for pain control. Follow-up in the office after neurosurgical evaluation.    I discussed the assessment and treatment plan with the patient. The patient was provided an opportunity to ask questions and all were answered. The patient agreed with the plan and demonstrated an understanding of the instructions.   The patient was advised to call back or  seek an in-person evaluation if the symptoms worsen or if the condition fails to improve as anticipated.  I provided 20 minutes of non-face-to-face time during this encounter.  Horald Pollen, MD  Primary Care at [facility]

## 2020-10-14 ENCOUNTER — Other Ambulatory Visit: Payer: 59

## 2020-10-16 ENCOUNTER — Other Ambulatory Visit: Payer: 59

## 2020-11-05 ENCOUNTER — Ambulatory Visit: Payer: 59 | Attending: Neurosurgery | Admitting: Physical Therapy

## 2020-11-05 ENCOUNTER — Other Ambulatory Visit: Payer: Self-pay

## 2020-11-05 ENCOUNTER — Encounter: Payer: Self-pay | Admitting: Physical Therapy

## 2020-11-05 VITALS — BP 115/76

## 2020-11-05 DIAGNOSIS — M542 Cervicalgia: Secondary | ICD-10-CM | POA: Diagnosis not present

## 2020-11-05 NOTE — Therapy (Signed)
[place], [place], [address] Phone: [phone]   Fax:  [phone]  Physical Therapy Evaluation  Patient Details  Name: [name] MRN: [id number] Date of Birth: 10-30-1960 Referring Provider (PT): Newman Pies, MD   Encounter Date: 11/05/2020   PT End of Session - 11/05/20 1357    Visit Number 1    Number of Visits 12    Date for PT Re-Evaluation 12/17/20    Authorization Type [employer]    PT Start Time 5035    PT Stop Time 1158    PT Time Calculation (min) 53 min    Activity Tolerance Patient tolerated treatment well    Behavior During Therapy [facility] for tasks assessed/performed           Past Medical History:  Diagnosis Date  . Anemia   . Anxiety   . Colon polyp 08/28/2012   Tubular adenoma  . Dense breast   . Depression   . Genital warts   . GERD (gastroesophageal reflux disease)   . Heart murmur   . Hemorrhoids   . Hx of cardiac catheterization 2009   normal coronary arteries  . Hx of menorrhagia    Novasure  . Hypokalemia   . Hypomagnesemia   . Hypophosphatemia   . Lactose intolerance in adult   . Meniscus degeneration, right    torn r knee/ 10/2019  . Mitral valve prolapse   . Normal cardiac stress test 03/09/2012   no ischemia  . Positive PPD   . Rapid heart beat    States "irreg heart beat"  . Thyroid disease    Thyroid nodules    Past Surgical History:  Procedure Laterality Date  . CARDIAC CATHETERIZATION     x2 with normal results per pt  . NOVASURE ABLATION    . UPPER GASTROINTESTINAL ENDOSCOPY  May 2013    Vitals:   11/05/20 1155  BP: 115/76      Subjective Assessment - 11/05/20 1155    Subjective Pt. is a 60 y/o female referred to PT for neck pain. She reports a history of chronic symptoms since the late 1990s-closer to onset had done a past round of PT with benefit noted at the time. Symptoms have been exacerbated over the past  approximately 4 months-no specific mechanism of onset or injury noted. Pain is local to neck region with intermittent shooting pain into right>left suboccipital region particularly with turning head. She does report waking at times with UE numbness bilat. but otherwise no UE radiating pain or parasthesias during the day noted. No dysphagia/dysarthria or gait disturbances noted. She had MRI 10/13/20-see chart copy of results. MRI impacted by motion artifact from claustraphobia but per report showedmulti-level degenerative changes with foraminal narrowing bilaterally greatest at C5-6 and C6-7. Pt. declined recent prednisone Rx. due to concern over immunosuppressive effects given the pandemic.    Pertinent History chronic neck pain history, see PMH    Limitations Sitting;House hold activities;Lifting    Diagnostic tests MRI    Patient Stated Goals Resolve neck pain    Currently in Pain? Yes    Pain Score 8     Pain Location Neck    Pain Orientation Right;Left    Pain Descriptors / Indicators Sharp    Pain Type Chronic pain    Pain Radiating Towards intermittent radiating to suboccipital and occipital regions right>left as well as upper trapezius    Pain Onset More than a month ago  Pain Frequency Intermittent    Aggravating Factors  turning head    Effect of Pain on Daily Activities impacts positional and activity tolerance, disturbed sleep              [facility] PT Assessment - 11/05/20 0001      Assessment   Medical Diagnosis Cervicalgia    Referring Provider (PT) Newman Pies, MD    Onset Date/Surgical Date 07/05/20   estimated per report of symptom exacerbation about 4 months ago, symptoms otherwise chronic   Hand Dominance Left    Prior Therapy past PT around 20 years ago/closer to time of original symptom onset      Precautions   Precautions None      Restrictions   Weight Bearing Restrictions No      Balance Screen   Has the patient fallen in the past 6 months No      [place] residence    Living Arrangements Parent    Additional Comments currently caregiver for mother      Prior Function   Level of Independence Independent with basic ADLs    Vocation Requirements pt. 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      Cognition   Overall Cognitive Status Within Functional Limits for tasks assessed      Observation/Other Assessments   Focus on Therapeutic Outcomes (FOTO)  34% function      Sensation   Light Touch Appears Intact   C4-T1 dermatomes     Posture/Postural Control   Posture Comments Mild rounding of shoulders      Deep Tendon Reflexes   DTR Assessment Site Biceps;Brachioradialis;Triceps    Biceps DTR 2+    Brachioradialis DTR 2+    Triceps DTR 2+      ROM / Strength   AROM / PROM / Strength AROM;Strength      AROM   Overall AROM Comments Bilateral shoulder AROM grossly WFL, pain with cervical rotation and pt. reported brief dizziness with cervical extension    AROM Assessment Site Cervical    Cervical Flexion 30    Cervical Extension 28    Cervical - Right Side Bend 28    Cervical - Left Side Bend 20    Cervical - Right Rotation 37    Cervical - Left Rotation 32      Strength   Overall Strength Comments left wrist flexion and extension 4+/5 otherwise bilateral UE grossly 5/5. Left grip 52 lbs., right grip 55 lbs. with grip dynamonometer      Palpation   Palpation comment Tight with trigger points noted bilat. upper trapezius region but pain not concordant, TTP lower occipital/suboccipital region      Special Tests   Other special tests Spurling's (-) bilat., ULTT A, B, and C (-) bilat., local sympom ease with cervical distraction, Hoffman's (-) bilat., inverted supinator sign (-) bilat., clonus (-) bilat. (wrist)                      Objective measurements completed on examination: See above findings.       [facility] Adult  PT Treatment/Exercise - 11/05/20 0001      Exercises   Exercises Neck   HEP handout review     Modalities   Modalities Moist Heat      Moist Heat Therapy   Number Minutes Moist Heat 10 Minutes    Moist Heat  Location Cervical   in sitting at end of eval                 PT Education - 11/05/20 1356    Education Details HEP, POC, symptom etiology, potential dry needling for myofascial component of symptoms    Person(s) Educated Patient    Methods Explanation;Handout    Comprehension Verbalized understanding               PT Long Term Goals - 11/05/20 1404      PT LONG TERM GOAL #1   Title Independent with HEP    Time 6    Period Weeks    Status New    Target Date 12/17/20      PT LONG TERM GOAL #2   Title Increase cervical rotation AROM to 50 deg or greater bilaterally to improve ability to turn head while driving    Baseline right 37 deg, left 32 deg    Time 6    Period Weeks    Status New    Target Date 12/17/20      PT LONG TERM GOAL #3   Title Improve FOTO outcome measure score to 58% or greater functional status    Baseline 34%    Time 6    Period Weeks    Status New    Target Date 12/17/20      PT LONG TERM GOAL #4   Title Decrease sleep disturbance symptoms at least 50% or greater from baseline status    Time 6    Period Weeks    Status New    Target Date 12/17/20      PT LONG TERM GOAL #5   Title Left wrist strength 5/5 to improve ability for gripping activities/IADLs    Baseline 4+/5    Time 6    Period Weeks    Status New    Target Date 12/17/20      Additional Long Term Goals   Additional Long Term Goals Yes      PT LONG TERM GOAL #6   Title Perform ADLs/IADLs as needed with neck pain <4/10 at worst    Baseline 8/10 pain at eval    Time 6    Period Weeks    Status New    Target Date 12/17/20                  Plan - 11/05/20 1357    Clinical Impression Statement Pt. presents with recent exacerbation of chronic  neck pain symptoms. Other than report of waking up at times with UE numbness no radicular symptoms noted at eval-pt. did present with some left wrist weakness vs. contralateral side otherwise no eval findings suggestive of radiculopathy. Pt. did note dizziness with cervical extension, unable to assess for dizziness symptoms with end-range cervical rotation (given significantly limited cervical rotation ROM) so advised MD follow up for assessment of this if persisting to rule out vascular etiology or other cause. No other symptoms or objective findings in eval suggestive of myelopathy. Pt. would benefit from PT to help relieve pain and address associated functional limitations for ADLs/IADLs.    Personal Factors and Comorbidities Comorbidity 3+    Comorbidities see PMH    Examination-Activity Limitations Carry;Caring for Others;Lift;Sleep;Sit    Examination-Participation Restrictions Driving    Stability/Clinical Decision Making Evolving/Moderate complexity    Clinical Decision Making Moderate    Rehab Potential Good    PT Frequency --   1-2x/week  PT Duration 6 weeks    PT Treatment/Interventions ADLs/Self Care Home Management;Cryotherapy;Electrical Stimulation;Traction;Moist Heat;Therapeutic exercise;Patient/family education;Manual techniques;Dry needling;Neuromuscular re-education;Therapeutic activities;Taping    PT Next Visit Plan review FOTO patient report by visit 3, monitor any further dizziness symptoms, suboccipital release and manual vs. potential trial mechanical traction, potential trial dry needling to suboccipitals, cervical paraspinals and upper trapezius/levator, review HEP as needed, progress cervical stabilization and postural strengthening as tolerated    PT Home Exercise Plan Access code: [email or web address]    Consulted and Agree with Plan of Care Patient           Patient will benefit from skilled therapeutic intervention in order to improve the following deficits and impairments:   Pain,Impaired flexibility,Decreased activity tolerance,Hypomobility,Increased muscle spasms,Decreased range of motion,Decreased strength  Visit Diagnosis: Cervicalgia     Problem List Patient Active Problem List   Diagnosis Date Noted  . Degenerative disc disease, cervical 10/13/2020  . Neck pain 10/13/2020  . Stress incontinence of urine 10/31/2018  . History of diverticulosis 05/04/2017  . History of PSVT (paroxysmal supraventricular tachycardia) 06/24/2012  . Multinodular goiter 05/12/2012  . SLEEP APNEA 01/19/2008  . IRRITABLE BOWEL SYNDROME 01/18/2008    Beaulah Dinning, PT, DPT 11/05/20 2:13 PM  [facility] [address] [place], [place], [address] Phone: [phone]   Fax:  [phone]  Name: [name] MRN: [id number] Date of Birth: 31-Dec-1960

## 2020-11-10 ENCOUNTER — Other Ambulatory Visit: Payer: Self-pay

## 2020-11-10 ENCOUNTER — Ambulatory Visit (INDEPENDENT_AMBULATORY_CARE_PROVIDER_SITE_OTHER): Payer: 59 | Admitting: Emergency Medicine

## 2020-11-10 ENCOUNTER — Encounter: Payer: Self-pay | Admitting: Emergency Medicine

## 2020-11-10 VITALS — BP 114/66 | HR 63 | Temp 98.0°F | Resp 16 | Ht 66.0 in | Wt 202.0 lb

## 2020-11-10 DIAGNOSIS — R590 Localized enlarged lymph nodes: Secondary | ICD-10-CM | POA: Diagnosis not present

## 2020-11-10 DIAGNOSIS — F419 Anxiety disorder, unspecified: Secondary | ICD-10-CM | POA: Diagnosis not present

## 2020-11-10 DIAGNOSIS — H9201 Otalgia, right ear: Secondary | ICD-10-CM | POA: Diagnosis not present

## 2020-11-10 MED ORDER — AMOXICILLIN-POT CLAVULANATE 875-125 MG PO TABS: 1.0000 | ORAL_TABLET | Freq: Two times a day (BID) | ORAL | 0 refills | Status: AC

## 2020-11-10 MED ORDER — ALPRAZOLAM 1 MG PO TABS: ORAL_TABLET | ORAL | 0 refills | Status: DC

## 2020-11-10 NOTE — Patient Instructions (Addendum)
If you have lab work done today you will be contacted with your lab results within the next 2 weeks.  If you have not heard from [place] then please contact us. The fastest way to get your results is to register for My Chart.   IF you received an x-ray today, you will receive an invoice from [facility]. Please contact [facility] at [phone] with questions or concerns regarding your invoice.   IF you received labwork today, you will receive an invoice from [place]. Please contact [facility] at [phone] with questions or concerns regarding your invoice.   Our billing staff will not be able to assist you with questions regarding bills from these companies.  You will be contacted with the lab results as soon as they are available. The fastest way to get your results is to activate your My Chart account. Instructions are located on the last page of this paperwork. If you have not heard from [place] regarding the results in 2 weeks, please contact this office.     Lymphadenopathy  Lymphadenopathy means that your lymph glands are swollen or larger than normal. Lymph glands, also called lymph nodes, are collections of tissue that filter excess fluid, bacteria, viruses, and waste from your bloodstream. They are part of your body's disease-fighting system (immune system), which protects your body from germs. There may be different causes of lymphadenopathy, depending on where it is in your body. Some types go away on their own. Lymphadenopathy can occur anywhere that you have lymph glands, including these areas:  Neck (cervical lymphadenopathy).  Chest (mediastinal lymphadenopathy).  Lungs (hilar lymphadenopathy).  Underarms (axillary lymphadenopathy).  Groin (inguinal lymphadenopathy). When your immune system responds to germs, infection-fighting cells and fluid build up in your lymph glands. This causes some swelling and enlargement. If the lymph nodes do not go back to  normal size after you have an infection or disease, your health care provider may do tests. These tests help to monitor your condition and find the reason why the glands are still swollen and enlarged. Follow these instructions at home:  Get plenty of rest.  Your health care provider may recommend over-the-counter medicines for pain. Take over-the-counter and prescription medicines only as told by your health care provider.  If directed, apply heat to swollen lymph glands as often as told by your health care provider. Use the heat source that your health care provider recommends, such as a moist heat pack or a heating pad. ? Place a towel between your skin and the heat source. ? Leave the heat on for 20-30 minutes. ? Remove the heat if your skin turns bright red. This is especially important if you are unable to feel pain, heat, or cold. You may have a greater risk of getting burned.  Check your affected lymph glands every day for changes. Check other lymph gland areas as told by your health care provider. Check for changes such as: ? More swelling. ? Sudden increase in size. ? Redness or pain. ? Hardness.  Keep all follow-up visits. This is important.   Contact a health care provider if you have:  Lymph glands that: ? Are still swollen after 2 weeks. ? Have suddenly gotten bigger or the swelling spreads. ? Are red, painful, or hard.  Fluid leaking from the skin near an enlarged lymph gland.  Problems with breathing.  A fever, chills, or night sweats.  Fatigue.  A sore throat.  Pain in your abdomen.  Weight loss.  Get help right away if you have:  Severe pain.  Chest pain.  Shortness of breath. These symptoms may represent a serious problem that is an emergency. Do not wait to see if the symptoms will go away. Get medical help right away. Call your local emergency services (911 in the [place] Do not drive yourself to the hospital. Summary  Lymphadenopathy means that  your lymph glands are swollen or larger than normal.  Lymph glands, also called lymph nodes, are collections of tissue that filter excess fluid, bacteria, viruses, and waste from the bloodstream. They are part of your body's disease-fighting system (immune system).  Lymphadenopathy can occur anywhere that you have lymph glands.  If the lymph nodes do not go back to normal size after you have an infection or disease, your health care provider may do tests to monitor your condition and find the reason why the glands are still swollen and enlarged.  Check your affected lymph glands every day for changes. Check other lymph gland areas as told by your health care provider. This information is not intended to replace advice given to you by your health care provider. Make sure you discuss any questions you have with your health care provider. Document Revised: 06/25/2020 Document Reviewed: 06/25/2020 Elsevier Patient Education  2021 [employer].

## 2020-11-10 NOTE — Progress Notes (Signed)
[name] 60 y.o.   Chief Complaint  Patient presents with  . Neck Pain    Per patient MRI was done about 2 wks ago and pain is not better  . Ear Pain    Per patient on the right for 1 week    HISTORY OF PRESENT ILLNESS: This is a 60 y.o. female complaining of swollen lymph nodes: Right side of the neck with right earache for 1 week. Also has history of chronic neck pain with abnormal recent cervical spine MRI. Neurosurgical evaluation by Dr. Arnoldo Morale as follows: Encounter Date Complaint History Of Present Illness  Feb-09-2020 neck pain Mrs. [name] is a 60 year old black female nonsmoker who has had chronic neck pain since at least 2003. At that time she was told she had a &#8220;bulging discs&#8221;. She does not remember a lot of the details back then but was sent for physical therapy and seemed to help.The patient has had intermittent neck pain which worsened in November of 2021 without notable precipitating event. She has been seen by Dr. Mitchel Honour and was treated with &#8220;pain pills&#8221;. When her symptoms persisted she was sent for a cervical MRI and is kindly sent for my consultation.During today&#39;s visit the patient relates the above information. She describes right-sided neck pain into her right suboccipital region and right trapezius. She does not have any pain, numbness tingling or weakness into her shoulders or arm. She has taken Tylenol without relief. She is not diabetic. She has had no recent physical therapy, chiropractic care, injections, etcetera. She tells me she hears some cracking and popping with range of motion in her neck. She also tells me she is claustrophobic and had difficulty lying still for her cervical MRI.  Plan is to undergo physical therapy and reevaluate.  Was started on Medrol Dosepak but patient did not take it.  HPI   Prior to Admission medications   Medication Sig Start Date End Date Taking? Authorizing Provider  Ascorbic Acid  (VITAMIN C) 1000 MG tablet Take 2,000 mg by mouth at bedtime.   Yes [provider]  aspirin EC 81 MG EC tablet Take 1 tablet (81 mg total) by mouth daily. 10/21/19  Yes Dixie Dials, MD  Calcium Carb-Cholecalciferol (OYSTER SHELL CALCIUM 250+D PO) Take 2 tablets by mouth at bedtime.   Yes [provider]  Cholecalciferol (VITAMIN D) 50 MCG (2000 UT) tablet Take 4,000 Units by mouth at bedtime.    Yes [provider]  fluticasone (FLONASE) 50 MCG/ACT nasal spray Place 1 spray into both nostrils at bedtime as needed for allergies or rhinitis.    Yes [provider]  loratadine (CLARITIN) 10 MG tablet Take 10 mg by mouth daily as needed for allergies or rhinitis.    Yes [provider]  meloxicam (MOBIC) 15 MG tablet Take 1 tablet (15 mg total) by mouth daily. 10/13/20  Yes Velton Roselle, Ines Bloomer, MD  metoprolol succinate (TOPROL-XL) 25 MG 24 hr tablet Take 25 mg by mouth 2 (two) times daily.    Yes [provider]  Multiple Vitamins-Minerals (ZINC PO) Take 1 tablet by mouth at bedtime.   Yes [provider]  PARoxetine (PAXIL-CR) 25 MG 24 hr tablet Take 25 mg by mouth daily.   Yes [provider]  Polyethyl Glycol-Propyl Glycol (SYSTANE OP) Place 2 drops into both eyes daily.   Yes [provider]  potassium chloride SA (KLOR-CON) 20 MEQ tablet TAKE 2 TABLETS(40 MEQ) BY MOUTH DAILY 08/12/20  Yes Dacoda Spallone,  Ines Bloomer, MD  TURMERIC PO Take 1 capsule by mouth daily as needed (pain/inflammation).    Yes [provider]  cyclobenzaprine (FLEXERIL) 5 MG tablet Take 1 tablet (5 mg total) by mouth at bedtime as needed for muscle spasms. Patient not taking: Reported on 11/10/2020 07/17/20   Just, Laurita Quint, FNP  losartan (COZAAR) 50 MG tablet Take 25 mg by mouth 2 (two) times daily.  Patient not taking: Reported on 11/10/2020    [provider]  OVER THE COUNTER MEDICATION Take 1 capsule by mouth daily. Soothe and  Heal for joint pain  Patient not taking: Reported on 11/10/2020    [provider]    Allergies  Allergen Reactions  . Ceftriaxone Sodium Itching  . Doxycycline Rash    Patient Active Problem List   Diagnosis Date Noted  . Degenerative disc disease, cervical 10/13/2020  . Neck pain 10/13/2020  . Stress incontinence of urine 10/31/2018  . History of diverticulosis 05/04/2017  . History of PSVT (paroxysmal supraventricular tachycardia) 06/24/2012  . Multinodular goiter 05/12/2012  . SLEEP APNEA 01/19/2008  . IRRITABLE BOWEL SYNDROME 01/18/2008    Past Medical History:  Diagnosis Date  . Anemia   . Anxiety   . Colon polyp 08/28/2012   Tubular adenoma  . Dense breast   . Depression   . Genital warts   . GERD (gastroesophageal reflux disease)   . Heart murmur   . Hemorrhoids   . Hx of cardiac catheterization 2009   normal coronary arteries  . Hx of menorrhagia    Novasure  . Hypokalemia   . Hypomagnesemia   . Hypophosphatemia   . Lactose intolerance in adult   . Meniscus degeneration, right    torn r knee/ 10/2019  . Mitral valve prolapse   . Normal cardiac stress test 03/09/2012   no ischemia  . Positive PPD   . Rapid heart beat    States "irreg heart beat"  . Thyroid disease    Thyroid nodules    Past Surgical History:  Procedure Laterality Date  . CARDIAC CATHETERIZATION     x2 with normal results per pt  . NOVASURE ABLATION    . UPPER GASTROINTESTINAL ENDOSCOPY  May 2013    Social History   Socioeconomic History  . Marital status: Divorced    Spouse name: Not on file  . Number of children: 2  . Years of education: 19  . Highest education level: Not on file  Occupational History  . Occupation: dialysis Engineer, production: [employer]  Tobacco Use  . Smoking status: Never Smoker  . Smokeless tobacco: Never Used  Vaping Use  . Vaping Use: Never used  Substance and Sexual Activity  . Alcohol use: No  . Drug use: No  . Sexual  activity: Yes  Other Topics Concern  . Not on file  Social History Narrative   Ms. 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 She lived in [place] and in [place] for a number of years her mom is from [place]. 16 years of education went to college at [employer] lives at home with her son who is in his 41s no pets   Neg ets tob etoh hx PA   6 hours of sleep   G2P2    TD2010  colonoscopy 2009   Now running  own business  HH of 2    Left handed    Caffeine use: tea sometimes         Social Determinants of Radio broadcast assistant Strain: Not on file  Food Insecurity: Not on file  Transportation Needs: Not on file  Physical Activity: Not on file  Stress: Not on file  Social Connections: Not on file  Intimate Partner Violence: Not on file    Family History  Problem Relation Age of Onset  . Heart disease Father        died Mi 70  . Hypertension Maternal Grandfather   . Diabetes Maternal Grandfather   . Heart disease Maternal Grandfather   . Colon cancer Maternal Grandfather   . Rectal cancer Maternal Grandfather   . Diabetes Maternal Grandmother   . Prostate cancer Neg Hx   . Breast cancer Neg Hx      Review of Systems  Constitutional: Negative.  Negative for chills and fever.  HENT: Positive for ear pain. Negative for congestion and sore throat.   Respiratory: Negative.  Negative for shortness of breath.   Cardiovascular: Negative.  Negative for chest pain and palpitations.  Gastrointestinal: Negative.  Negative for abdominal pain, diarrhea, nausea and vomiting.  Genitourinary: Negative.  Negative for dysuria and hematuria.  Musculoskeletal: Positive for neck pain.  Skin: Negative.   Neurological: Negative.  Negative for dizziness and headaches.  All other systems reviewed and are negative.    Physical Exam Vitals reviewed.  Constitutional:       Appearance: Normal appearance.  HENT:     Head: Normocephalic.     Right Ear: Tympanic membrane, ear canal and external ear normal.     Left Ear: Tympanic membrane, ear canal and external ear normal.     Nose: Nose normal.     Mouth/Throat:     Mouth: Mucous membranes are moist.     Pharynx: Oropharynx is clear. No oropharyngeal exudate or posterior oropharyngeal erythema.  Eyes:     Extraocular Movements: Extraocular movements intact.     Conjunctiva/sclera: Conjunctivae normal.     Pupils: Pupils are equal, round, and reactive to light.  Cardiovascular:     Rate and Rhythm: Normal rate and regular rhythm.     Pulses: Normal pulses.     Heart sounds: Normal heart sounds.  Pulmonary:     Effort: Pulmonary effort is normal.     Breath sounds: Normal breath sounds.  Musculoskeletal:        General: Normal range of motion.     Cervical back: Normal range of motion.  Lymphadenopathy:     Cervical: Cervical adenopathy (Right submandibular area, tender to touch) present.  Skin:    General: Skin is warm and dry.     Capillary Refill: Capillary refill takes less than 2 seconds.  Neurological:     General: No focal deficit present.     Mental Status: She is alert and oriented to person, place, and time.  Psychiatric:        Mood and Affect: Mood normal.        Behavior: Behavior normal.      ASSESSMENT & PLAN: Teralyn was seen today for neck pain and ear pain.  Diagnoses and all orders for this visit:  Submandibular lymphadenopathy -     amoxicillin-clavulanate (AUGMENTIN) 875-125 MG tablet; Take 1 tablet by mouth 2 (two) times daily for 7 days.  Anxiety due to invasive procedure -     ALPRAZolam (XANAX) 1 MG tablet;  Take 1 tablet 1 hour before procedure.  Acute otalgia, right    Patient Instructions       If you have lab work done today you will be contacted with your lab results within the next 2 weeks.  If you have not heard from [place] then please contact us. The  fastest way to get your results is to register for My Chart.   IF you received an x-ray today, you will receive an invoice from [facility]. Please contact [facility] at [phone] with questions or concerns regarding your invoice.   IF you received labwork today, you will receive an invoice from [place]. Please contact [facility] at [phone] with questions or concerns regarding your invoice.   Our billing staff will not be able to assist you with questions regarding bills from these companies.  You will be contacted with the lab results as soon as they are available. The fastest way to get your results is to activate your My Chart account. Instructions are located on the last page of this paperwork. If you have not heard from [place] regarding the results in 2 weeks, please contact this office.     Lymphadenopathy  Lymphadenopathy means that your lymph glands are swollen or larger than normal. Lymph glands, also called lymph nodes, are collections of tissue that filter excess fluid, bacteria, viruses, and waste from your bloodstream. They are part of your body's disease-fighting system (immune system), which protects your body from germs. There may be different causes of lymphadenopathy, depending on where it is in your body. Some types go away on their own. Lymphadenopathy can occur anywhere that you have lymph glands, including these areas:  Neck (cervical lymphadenopathy).  Chest (mediastinal lymphadenopathy).  Lungs (hilar lymphadenopathy).  Underarms (axillary lymphadenopathy).  Groin (inguinal lymphadenopathy). When your immune system responds to germs, infection-fighting cells and fluid build up in your lymph glands. This causes some swelling and enlargement. If the lymph nodes do not go back to normal size after you have an infection or disease, your health care provider may do tests. These tests help to monitor your condition and find the reason why the glands  are still swollen and enlarged. Follow these instructions at home:  Get plenty of rest.  Your health care provider may recommend over-the-counter medicines for pain. Take over-the-counter and prescription medicines only as told by your health care provider.  If directed, apply heat to swollen lymph glands as often as told by your health care provider. Use the heat source that your health care provider recommends, such as a moist heat pack or a heating pad. ? Place a towel between your skin and the heat source. ? Leave the heat on for 20-30 minutes. ? Remove the heat if your skin turns bright red. This is especially important if you are unable to feel pain, heat, or cold. You may have a greater risk of getting burned.  Check your affected lymph glands every day for changes. Check other lymph gland areas as told by your health care provider. Check for changes such as: ? More swelling. ? Sudden increase in size. ? Redness or pain. ? Hardness.  Keep all follow-up visits. This is important.   Contact a health care provider if you have:  Lymph glands that: ? Are still swollen after 2 weeks. ? Have suddenly gotten bigger or the swelling spreads. ? Are red, painful, or hard.  Fluid leaking from the skin near an enlarged lymph gland.  Problems with breathing.  A fever, chills, or night sweats.  Fatigue.  A sore throat.  Pain in your abdomen.  Weight loss. Get help right away if you have:  Severe pain.  Chest pain.  Shortness of breath. These symptoms may represent a serious problem that is an emergency. Do not wait to see if the symptoms will go away. Get medical help right away. Call your local emergency services (911 in the [place] Do not drive yourself to the hospital. Summary  Lymphadenopathy means that your lymph glands are swollen or larger than normal.  Lymph glands, also called lymph nodes, are collections of tissue that filter excess fluid, bacteria, viruses, and  waste from the bloodstream. They are part of your body's disease-fighting system (immune system).  Lymphadenopathy can occur anywhere that you have lymph glands.  If the lymph nodes do not go back to normal size after you have an infection or disease, your health care provider may do tests to monitor your condition and find the reason why the glands are still swollen and enlarged.  Check your affected lymph glands every day for changes. Check other lymph gland areas as told by your health care provider. This information is not intended to replace advice given to you by your health care provider. Make sure you discuss any questions you have with your health care provider. Document Revised: 06/25/2020 Document Reviewed: 06/25/2020 Elsevier Patient Education  2021 [employer]      Agustina Caroli, MD [facility]

## 2020-11-13 ENCOUNTER — Other Ambulatory Visit: Payer: Self-pay

## 2020-11-13 ENCOUNTER — Ambulatory Visit: Payer: 59 | Attending: Neurosurgery | Admitting: Physical Therapy

## 2020-11-13 ENCOUNTER — Encounter: Payer: Self-pay | Admitting: Physical Therapy

## 2020-11-13 DIAGNOSIS — M542 Cervicalgia: Secondary | ICD-10-CM | POA: Diagnosis not present

## 2020-11-13 NOTE — Therapy (Signed)
[place], [place], [address] Phone: [phone]   Fax:  [phone]  Physical Therapy Treatment  Patient Details  Name: [name] MRN: [id number] Date of Birth: 1960-11-27 Referring Provider (PT): Newman Pies, MD   Encounter Date: 11/13/2020   PT End of Session - 11/13/20 0920    Visit Number 2    Number of Visits 12    Date for PT Re-Evaluation 12/17/20    Authorization Type [employer]    PT Start Time [phone]    PT Stop Time 1013    PT Time Calculation (min) 55 min    Activity Tolerance Patient tolerated treatment well    Behavior During Therapy [facility] for tasks assessed/performed           Past Medical History:  Diagnosis Date  . Anemia   . Anxiety   . Colon polyp 08/28/2012   Tubular adenoma  . Dense breast   . Depression   . Genital warts   . GERD (gastroesophageal reflux disease)   . Heart murmur   . Hemorrhoids   . Hx of cardiac catheterization 2009   normal coronary arteries  . Hx of menorrhagia    Novasure  . Hypokalemia   . Hypomagnesemia   . Hypophosphatemia   . Lactose intolerance in adult   . Meniscus degeneration, right    torn r knee/ 10/2019  . Mitral valve prolapse   . Normal cardiac stress test 03/09/2012   no ischemia  . Positive PPD   . Rapid heart beat    States "irreg heart beat"  . Thyroid disease    Thyroid nodules    Past Surgical History:  Procedure Laterality Date  . CARDIAC CATHETERIZATION     x2 with normal results per pt  . NOVASURE ABLATION    . UPPER GASTROINTESTINAL ENDOSCOPY  May 2013    There were no vitals filed for this visit.   Subjective Assessment - 11/13/20 0915    Subjective Neck about the same as status at eval. Pain in right suboccipital region 6/10 worse with turning head with c/o crepitus noted with cervical rotation. No UE radiating symptoms and no dizziness symptoms but has been avoiding cervical extension.     Currently in Pain? Yes    Pain Score 6     Pain Location Neck    Pain Orientation Right;Posterior    Pain Descriptors / Indicators Aching    Pain Type Chronic pain    Pain Onset More than a month ago    Pain Frequency Intermittent    Aggravating Factors  turning head    Pain Relieving Factors stretching              [facility] PT Assessment - 11/13/20 0001      AROM   Cervical - Right Rotation 46    Cervical - Left Rotation 37                         [facility] Adult PT Treatment/Exercise - 11/13/20 0001      Neck Exercises: Seated   Other Seated Exercise self-SNAG cervical rotation with strap x 5 ea. way bilat.      Neck Exercises: Supine   Neck Retraction 15 reps      Moist Heat Therapy   Number Minutes Moist Heat 10 Minutes    Moist Heat Location Cervical      Manual Therapy   Manual Therapy  Soft tissue mobilization;Manual Traction    Soft tissue mobilization STM right SCM in supine    Manual Traction cervical manual traction and suboccipital release   pt. noted benefit iwth slight left cervical lateral flexion to bias rigyt side muscles     Neck Exercises: Stretches   Upper Trapezius Stretch Right;3 reps;30 seconds   supine manual stretch   Levator Stretch Right;3 reps;30 seconds   supine manual stretch   Other Neck Stretches seated right SCM stretch with strap 3x20 sec holds                  PT Education - 11/13/20 1008    Education Details HEP updates-added SCM/scalene stretch, FOTO pt. report    Person(s) Educated Patient    Methods Explanation    Comprehension Verbalized understanding               PT Long Term Goals - 11/13/20 1005      PT LONG TERM GOAL #1   Title Independent with HEP    Baseline updates ongoing    Time 6    Period Weeks    Status On-going      PT LONG TERM GOAL #2   Title Increase cervical rotation AROM to 50 deg or greater bilaterally to improve ability to turn head while driving    Baseline right 46 deg,  left 37 deg 11/14/31    Time 6    Period Weeks    Status On-going      PT LONG TERM GOAL #3   Title Improve FOTO outcome measure score to 58% or greater functional status    Baseline 34%    Time 6    Period Weeks    Status On-going      PT LONG TERM GOAL #4   Title Decrease sleep disturbance symptoms at least 50% or greater from baseline status    Baseline ongoing    Time 6    Period Weeks    Status On-going      PT LONG TERM GOAL #5   Title Left wrist strength 5/5 to improve ability for gripping activities/IADLs    Baseline 4+/5    Time 6    Period Weeks    Status On-going      PT LONG TERM GOAL #6   Title Perform ADLs/IADLs as needed with neck pain <4/10 at worst    Baseline 6/10 pain    Time 6    Period Weeks    Status On-going                 Plan - 11/13/20 1008    Clinical Impression Statement Primary pain right occipital/suboccipital region as noted in subjective-concordant pain noted palpation and stretches to right SCM region so included manual therapy and stretches to address/updated HEP with stretch as well. Discussed potential dry needling but deferred for today/may consider at future visit pending pt. preference. No dizziness symptoms noted with tx. today. Cervical rotation AROM still limited but improving from baseline status.    Personal Factors and Comorbidities Comorbidity 3+    Comorbidities see PMH    Examination-Activity Limitations Carry;Caring for Others;Lift;Sleep;Sit    Examination-Participation Restrictions Driving    Stability/Clinical Decision Making Evolving/Moderate complexity    Clinical Decision Making Moderate    Rehab Potential Good    PT Frequency --   1-2x/week   PT Duration 6 weeks    PT Treatment/Interventions ADLs/Self Care Home Management;Cryotherapy;Electrical Stimulation;Traction;Moist Heat;Therapeutic exercise;Patient/family education;Manual techniques;Dry needling;Neuromuscular  re-education;Therapeutic activities;Taping     PT Next Visit Plan Continue traction manual vs. mechanical and suboccipital release, further manual, potential trial dry needling, continue exercise progression as tolerated-Add Theraband postural exercises pending tolerance    PT Home Exercise Plan Access code: [email or web address]    Consulted and Agree with Plan of Care Patient           Patient will benefit from skilled therapeutic intervention in order to improve the following deficits and impairments:  Pain,Impaired flexibility,Decreased activity tolerance,Hypomobility,Increased muscle spasms,Decreased range of motion,Decreased strength  Visit Diagnosis: Cervicalgia     Problem List Patient Active Problem List   Diagnosis Date Noted  . Degenerative disc disease, cervical 10/13/2020  . Neck pain 10/13/2020  . Stress incontinence of urine 10/31/2018  . History of diverticulosis 05/04/2017  . History of PSVT (paroxysmal supraventricular tachycardia) 06/24/2012  . Multinodular goiter 05/12/2012  . SLEEP APNEA 01/19/2008  . IRRITABLE BOWEL SYNDROME 01/18/2008    Beaulah Dinning, PT, DPT 11/13/20 10:15 AM  [place] [facility] [address] [place], [place], [address] Phone: [phone]   Fax:  [phone]  Name: Brynda Heick MRN: [id number] Date of Birth: May 28, 1961

## 2020-11-18 ENCOUNTER — Other Ambulatory Visit: Payer: Self-pay

## 2020-11-18 ENCOUNTER — Encounter: Payer: Self-pay | Admitting: Physical Therapy

## 2020-11-18 ENCOUNTER — Ambulatory Visit: Payer: 59 | Admitting: Physical Therapy

## 2020-11-18 DIAGNOSIS — M542 Cervicalgia: Secondary | ICD-10-CM | POA: Diagnosis not present

## 2020-11-18 NOTE — Patient Instructions (Signed)

## 2020-11-18 NOTE — Therapy (Signed)
[place], [place], [address] Phone: [phone]   Fax:  [phone]  Physical Therapy Treatment  Patient Details  Name: [name] MRN: [id number] Date of Birth: [birth year] Referring Provider (PT): Newman Pies, MD   Encounter Date: 11/18/2020   PT End of Session - 11/18/20 1549    Visit Number 3    Number of Visits 12    Date for PT Re-Evaluation 12/17/20    Authorization Type [employer]    PT Start Time 1548    PT Stop Time 1630    PT Time Calculation (min) 42 min    Activity Tolerance Patient tolerated treatment well    Behavior During Therapy [facility] for tasks assessed/performed           Past Medical History:  Diagnosis Date  . Anemia   . Anxiety   . Colon polyp 08/28/2012   Tubular adenoma  . Dense breast   . Depression   . Genital warts   . GERD (gastroesophageal reflux disease)   . Heart murmur   . Hemorrhoids   . Hx of cardiac catheterization 2009   normal coronary arteries  . Hx of menorrhagia    Novasure  . Hypokalemia   . Hypomagnesemia   . Hypophosphatemia   . Lactose intolerance in adult   . Meniscus degeneration, right    torn r knee/ 10/2019  . Mitral valve prolapse   . Normal cardiac stress test 03/09/2012   no ischemia  . Positive PPD   . Rapid heart beat    States "irreg heart beat"  . Thyroid disease    Thyroid nodules    Past Surgical History:  Procedure Laterality Date  . CARDIAC CATHETERIZATION     x2 with normal results per pt  . NOVASURE ABLATION    . UPPER GASTROINTESTINAL ENDOSCOPY  May 2013    There were no vitals filed for this visit.   Subjective Assessment - 11/18/20 1552    Subjective " It's still painfull and it seems that it is hurting more.I think my shoulder is involved."              [facility] PT Assessment - 11/18/20 0001      Assessment   Medical Diagnosis Cervicalgia    Referring Provider (PT) Newman Pies, MD                          [facility] Adult PT Treatment/Exercise - 11/18/20 0001      Neck Exercises: Theraband   Other Theraband Exercises double ER with scapular retraction with red band1 x 15   maintaing anterior pelvic tilt while seated     Neck Exercises: Seated   Neck Retraction 10 reps;5 secs   demonstration for proper form     Lumbar Exercises: Seated   Other Seated Lumbar Exercises anterior pelvic tilt holding ADIM x 5 seconds 1 x 10   to promote efficient posture     Manual Therapy   Manual Therapy Joint mobilization    Manual therapy comments skilled palpation and monitoring of pt throughout TPDN    Joint Mobilization T1-T8 PA grade IV, bil first rib mobs grade IV    Soft tissue mobilization IASTM along R upper trap/ levator scapulae      Neck Exercises: Stretches   Upper Trapezius Stretch 2 reps;30 seconds    Levator Stretch 2 reps;30 seconds  Trigger Point Dry Needling - 11/18/20 0001    Consent Given? Yes    Muscles Treated Head and Neck Upper trapezius    Upper Trapezius Response Twitch reponse elicited;Palpable increased muscle length   R only               PT Education - 11/18/20 1633    Education Details Reviewed HEP and proper form with stretching, what TPDN and beneifts of treatment. reivewed efficient posture and provided standing/ seated handout    Person(s) Educated Patient    Methods Explanation;Verbal cues;Handout    Comprehension Verbalized understanding;Verbal cues required               PT Long Term Goals - 11/13/20 1005      PT LONG TERM GOAL #1   Title Independent with HEP    Baseline updates ongoing    Time 6    Period Weeks    Status On-going      PT LONG TERM GOAL #2   Title Increase cervical rotation AROM to 50 deg or greater bilaterally to improve ability to turn head while driving    Baseline right 46 deg, left 37 deg 11/14/31    Time 6    Period Weeks    Status On-going      PT LONG TERM GOAL #3    Title Improve FOTO outcome measure score to 58% or greater functional status    Baseline 34%    Time 6    Period Weeks    Status On-going      PT LONG TERM GOAL #4   Title Decrease sleep disturbance symptoms at least 50% or greater from baseline status    Baseline ongoing    Time 6    Period Weeks    Status On-going      PT LONG TERM GOAL #5   Title Left wrist strength 5/5 to improve ability for gripping activities/IADLs    Baseline 4+/5    Time 6    Period Weeks    Status On-going      PT LONG TERM GOAL #6   Title Perform ADLs/IADLs as needed with neck pain <4/10 at worst    Baseline 6/10 pain    Time 6    Period Weeks    Status On-going                 Plan - 11/18/20 1630    Clinical Impression Statement pt reports doing some of the exercises and notes she feels some increase in soreness today. educated and consent was given for TPDN focusing on the R upper trap followed with IASTM techniques and thoracic and rib mobs. focused on posture to reduce abnormal tension especially in sitting. worked on posterior shoulder strengthenign and reviewed HEp which she did require cues for upper trap stretching and chin tuck exercise. end of session she noted reduced stiffness in the upper trap.    PT Treatment/Interventions ADLs/Self Care Home Management;Cryotherapy;Electrical Stimulation;Traction;Moist Heat;Therapeutic exercise;Patient/family education;Manual techniques;Dry needling;Neuromuscular re-education;Therapeutic activities;Taping    PT Next Visit Plan Continue traction manual vs. mechanical and suboccipital release, further manual, response to TPDN for R upper trap, and continue as tolerated.  continue exercise progression as tolerated-Add Theraband postural exercises pending tolerance    PT Home Exercise Plan Access code: [email or web address]    Consulted and Agree with Plan of Care Patient           Patient will benefit from skilled therapeutic intervention in order to improve  the following deficits and impairments:  Pain,Impaired flexibility,Decreased activity tolerance,Hypomobility,Increased muscle spasms,Decreased range of motion,Decreased strength  Visit Diagnosis: Cervicalgia     Problem List Patient Active Problem List   Diagnosis Date Noted  . Degenerative disc disease, cervical 10/13/2020  . Neck pain 10/13/2020  . Stress incontinence of urine 10/31/2018  . History of diverticulosis 05/04/2017  . History of PSVT (paroxysmal supraventricular tachycardia) 06/24/2012  . Multinodular goiter 05/12/2012  . SLEEP APNEA 01/19/2008  . IRRITABLE BOWEL SYNDROME 01/18/2008   Starr Lake PT, DPT, LAT, ATC  11/18/20  4:34 PM      [facility] [address] [place], [place], [address] Phone: [phone]   Fax:  [phone]  Name: [name] MRN: [id number] Date of Birth: 1961/01/06

## 2020-11-24 ENCOUNTER — Other Ambulatory Visit: Payer: Self-pay | Admitting: Emergency Medicine

## 2020-11-24 DIAGNOSIS — E876 Hypokalemia: Secondary | ICD-10-CM

## 2020-11-24 NOTE — Telephone Encounter (Signed)
   Last refill: 1130/2021  Future visit scheduled:  yes  Notes to clinic:  review for refill Script last filled several months ago   Requested Prescriptions  Pending Prescriptions Disp Refills   potassium chloride SA (KLOR-CON) 20 MEQ tablet [Pharmacy Med Name: POTASSIUM CL 20MEQ ER TABLETS] 180 tablet     Sig: TAKE 2 TABLETS(40 MEQ) BY MOUTH DAILY      Endocrinology:  Minerals - Potassium Supplementation Passed - 11/24/2020  1:35 PM      Passed - K in normal range and within 360 days    Potassium  Date Value Ref Range Status  09/23/2020 4.1 3.5 - 5.2 mmol/L Final          Passed - Cr in normal range and within 360 days    Creatinine, Ser  Date Value Ref Range Status  09/23/2020 0.79 0.57 - 1.00 mg/dL Final          Passed - Valid encounter within last 12 months    Recent Outpatient Visits           2 weeks ago Submandibular lymphadenopathy   [facility] at [facility], Ines Bloomer, MD   1 month ago Degenerative disc disease, cervical   Primary Care at [facility], Ines Bloomer, MD   2 months ago Essential hypertension   Primary Care at [place], Ines Bloomer, MD   2 months ago Hypokalemia   Primary Care at [facility], Ines Bloomer, MD   4 months ago Neck pain   Primary Care at [place], Laurita Quint, [place]       Future Appointments             In 4 months [place], Ines Bloomer, MD [place] at [facility]

## 2020-11-24 NOTE — Telephone Encounter (Signed)
Please schedule f/u appt for med refills

## 2020-11-24 NOTE — Telephone Encounter (Signed)
Spoke to patient to inform her that a 90 day supply was called in.

## 2020-11-24 NOTE — Telephone Encounter (Signed)
Spoke to patient to inform that 30 day supply has been sent. Patient requested a 90 day supply because she uses it regularly. Please advise at [phone].

## 2020-11-24 NOTE — Telephone Encounter (Signed)
30 day supply has been sent  

## 2020-11-25 ENCOUNTER — Ambulatory Visit: Payer: 59 | Admitting: Physical Therapy

## 2020-11-25 ENCOUNTER — Other Ambulatory Visit: Payer: Self-pay

## 2020-11-25 ENCOUNTER — Encounter: Payer: Self-pay | Admitting: Physical Therapy

## 2020-11-25 DIAGNOSIS — M542 Cervicalgia: Secondary | ICD-10-CM

## 2020-11-25 NOTE — Therapy (Signed)
[place], [place], [address] Phone: [phone]   Fax:  [phone]  Physical Therapy Treatment  Patient Details  Name: [name] MRN: [id number] Date of Birth: [birth year] Referring Provider (PT): Newman Pies, MD   Encounter Date: 11/25/2020   PT End of Session - 11/25/20 1400    Visit Number 4    Number of Visits 12    Date for PT Re-Evaluation 12/17/20    Authorization Type [employer]    PT Start Time 1448   pt. arrived late   PT Stop Time 1426    PT Time Calculation (min) 47 min    Activity Tolerance Patient limited by pain    Behavior During Therapy [facility] for tasks assessed/performed           Past Medical History:  Diagnosis Date  . Anemia   . Anxiety   . Colon polyp 08/28/2012   Tubular adenoma  . Dense breast   . Depression   . Genital warts   . GERD (gastroesophageal reflux disease)   . Heart murmur   . Hemorrhoids   . Hx of cardiac catheterization 2009   normal coronary arteries  . Hx of menorrhagia    Novasure  . Hypokalemia   . Hypomagnesemia   . Hypophosphatemia   . Lactose intolerance in adult   . Meniscus degeneration, right    torn r knee/ 10/2019  . Mitral valve prolapse   . Normal cardiac stress test 03/09/2012   no ischemia  . Positive PPD   . Rapid heart beat    States "irreg heart beat"  . Thyroid disease    Thyroid nodules    Past Surgical History:  Procedure Laterality Date  . CARDIAC CATHETERIZATION     x2 with normal results per pt  . NOVASURE ABLATION    . UPPER GASTROINTESTINAL ENDOSCOPY  May 2013    There were no vitals filed for this visit.   Subjective Assessment - 11/25/20 1356    Subjective Pt. reports some initial soreness the next day after dry needling but then felt like it helped with pain for a few days. Symptoms since exacerbated for the past couple of days with right upper trapezius region pain along with general neck  stiffness. Pain with sidebending right>left as well as cervical rotation ROM.    Pertinent History chronic neck pain history, see PMH    Currently in Pain? Yes    Pain Score 9     Pain Location Neck    Pain Orientation Right    Pain Descriptors / Indicators Aching    Pain Type Chronic pain    Pain Radiating Towards right upper trapezius region    Pain Onset More than a month ago    Pain Frequency Intermittent    Aggravating Factors  right sidebending, turning head "a certain way"    Pain Relieving Factors positional changes    Effect of Pain on Daily Activities limits positional and activity tolerance, sleep disturbance                             [facility] Adult PT Treatment/Exercise - 11/25/20 0001      Neck Exercises: Theraband   Shoulder Extension 15 reps;Red    Rows 15 reps;Red    Horizontal ABduction 15 reps;Red    Horizontal ABduction Limitations supine    Other Theraband Exercises double ER with scapular retraction with  red band1 x 15   maintaing anterior pelvic tilt while seated     Moist Heat Therapy   Number Minutes Moist Heat 10 Minutes    Moist Heat Location Cervical      Manual Therapy   Joint Mobilization cervical PAs grade I-II    Soft tissue mobilization right upper trapezius region    Manual Traction suboccipital release, attempted gentle traction but held due to pain/difficulty tolerating      Neck Exercises: Stretches   Upper Trapezius Stretch Right;3 reps;30 seconds   seated self-stretch           Trigger Point Dry Needling - 11/25/20 0001    Consent Given? Yes    Muscles Treated Head and Neck Upper trapezius   right   Upper Trapezius Response Twitch reponse elicited                PT Education - 11/25/20 1422    Education Details dry needling, trigger point etiology, Theracane use    Person(s) Educated Patient    Methods Explanation    Comprehension Verbalized understanding               PT Long Term Goals -  11/13/20 1005      PT LONG TERM GOAL #1   Title Independent with HEP    Baseline updates ongoing    Time 6    Period Weeks    Status On-going      PT LONG TERM GOAL #2   Title Increase cervical rotation AROM to 50 deg or greater bilaterally to improve ability to turn head while driving    Baseline right 46 deg, left 37 deg 11/14/31    Time 6    Period Weeks    Status On-going      PT LONG TERM GOAL #3   Title Improve FOTO outcome measure score to 58% or greater functional status    Baseline 34%    Time 6    Period Weeks    Status On-going      PT LONG TERM GOAL #4   Title Decrease sleep disturbance symptoms at least 50% or greater from baseline status    Baseline ongoing    Time 6    Period Weeks    Status On-going      PT LONG TERM GOAL #5   Title Left wrist strength 5/5 to improve ability for gripping activities/IADLs    Baseline 4+/5    Time 6    Period Weeks    Status On-going      PT LONG TERM GOAL #6   Title Perform ADLs/IADLs as needed with neck pain <4/10 at worst    Baseline 6/10 pain    Time 6    Period Weeks    Status On-going                 Plan - 11/25/20 1422    Clinical Impression Statement Fair status with high level today but pt. did note benefit from dry needling as reported in subjective so continued with this today and added further T-band exercises for postural strengthing which suspect postural contribution to symptoms. Limited tolerance manual traction but otherwise able to tolerate session as noted per flowsheet/otherwise therapy goals ongoing.    Personal Factors and Comorbidities Comorbidity 3+    Examination-Activity Limitations Carry;Caring for Others;Lift;Sleep;Sit    Examination-Participation Restrictions Driving    Stability/Clinical Decision Making Evolving/Moderate complexity    Clinical Decision Making Moderate  Rehab Potential Good    PT Frequency --   1-2x/week   PT Duration 6 weeks    PT Treatment/Interventions  ADLs/Self Care Home Management;Cryotherapy;Electrical Stimulation;Traction;Moist Heat;Therapeutic exercise;Patient/family education;Manual techniques;Dry needling;Neuromuscular re-education;Therapeutic activities;Taping    PT Next Visit Plan continue progression neck ROM, stabilization, postural strengthening, manual as needed, further dry needling prn    PT Home Exercise Plan Access code: [email or web address]    Consulted and Agree with Plan of Care Patient           Patient will benefit from skilled therapeutic intervention in order to improve the following deficits and impairments:  Pain,Impaired flexibility,Decreased activity tolerance,Hypomobility,Increased muscle spasms,Decreased range of motion,Decreased strength  Visit Diagnosis: Cervicalgia     Problem List Patient Active Problem List   Diagnosis Date Noted  . Degenerative disc disease, cervical 10/13/2020  . Neck pain 10/13/2020  . Stress incontinence of urine 10/31/2018  . History of diverticulosis 05/04/2017  . History of PSVT (paroxysmal supraventricular tachycardia) 06/24/2012  . Multinodular goiter 05/12/2012  . SLEEP APNEA 01/19/2008  . IRRITABLE BOWEL SYNDROME 01/18/2008    Beaulah Dinning, PT, DPT 11/25/20 2:28 PM  [facility] [address] [place], [place], [address] Phone: [phone]   Fax:  [phone]  Name: [name] MRN: [id number] Date of Birth: Apr 20, 1961

## 2020-11-27 ENCOUNTER — Encounter: Payer: Self-pay | Admitting: Physical Therapy

## 2020-11-27 ENCOUNTER — Ambulatory Visit: Payer: 59 | Admitting: Physical Therapy

## 2020-11-27 ENCOUNTER — Other Ambulatory Visit: Payer: Self-pay

## 2020-11-27 DIAGNOSIS — M542 Cervicalgia: Secondary | ICD-10-CM

## 2020-11-27 NOTE — Therapy (Signed)
[place], [place], [address] Phone: [phone]   Fax:  [phone]  Physical Therapy Treatment  Patient Details  Name: [name] MRN: [id number] Date of Birth: [birth year] Referring Provider (PT): Newman Pies, MD   Encounter Date: 11/27/2020   PT End of Session - 11/27/20 0933    Visit Number 5    Number of Visits 12    Date for PT Re-Evaluation 12/17/20    PT Start Time 0932    PT Stop Time 1012    PT Time Calculation (min) 40 min    Activity Tolerance Patient limited by pain    Behavior During Therapy [facility] for tasks assessed/performed           Past Medical History:  Diagnosis Date  . Anemia   . Anxiety   . Colon polyp 08/28/2012   Tubular adenoma  . Dense breast   . Depression   . Genital warts   . GERD (gastroesophageal reflux disease)   . Heart murmur   . Hemorrhoids   . Hx of cardiac catheterization 2009   normal coronary arteries  . Hx of menorrhagia    Novasure  . Hypokalemia   . Hypomagnesemia   . Hypophosphatemia   . Lactose intolerance in adult   . Meniscus degeneration, right    torn r knee/ 10/2019  . Mitral valve prolapse   . Normal cardiac stress test 03/09/2012   no ischemia  . Positive PPD   . Rapid heart beat    States "irreg heart beat"  . Thyroid disease    Thyroid nodules    Past Surgical History:  Procedure Laterality Date  . CARDIAC CATHETERIZATION     x2 with normal results per pt  . NOVASURE ABLATION    . UPPER GASTROINTESTINAL ENDOSCOPY  May 2013    There were no vitals filed for this visit.   Subjective Assessment - 11/27/20 0934    Subjective "I am feeling a bit better, the neck isn't as sore, but is still there."    Currently in Pain? Yes    Pain Score 5     Pain Orientation Right    Pain Descriptors / Indicators Aching    Pain Type Chronic pain              [facility] PT Assessment - 11/27/20 0001      Assessment    Medical Diagnosis Cervicalgia    Referring Provider (PT) Newman Pies, MD                         [facility] Adult PT Treatment/Exercise - 11/27/20 0001      Neck Exercises: Machines for Strengthening   UBE (Upper Arm Bike) L1 x 6 min   fwd/bwd x 3 min     Neck Exercises: Theraband   Rows 20 reps;Green    Other Theraband Exercises double ER with green band 2 x 10      Manual Therapy   Manual Therapy Other (comment)    Manual therapy comments skilled palpation and monitoring of pt throughout TPDN    Other Manual Therapy upper trap inhibtion tape      Neck Exercises: Stretches   Upper Trapezius Stretch 2 reps;Right;30 seconds            Trigger Point Dry Needling - 11/27/20 0001    Consent Given? Yes    Muscles Treated Head and Neck Upper trapezius  Upper Trapezius Response Twitch reponse elicited;Palpable increased muscle length   R               PT Education - 11/27/20 1010    Education Details reviewed and updated HEP today.    Person(s) Educated Patient    Methods Explanation;Verbal cues;Handout    Comprehension Verbalized understanding;Verbal cues required               PT Long Term Goals - 11/13/20 1005      PT LONG TERM GOAL #1   Title Independent with HEP    Baseline updates ongoing    Time 6    Period Weeks    Status On-going      PT LONG TERM GOAL #2   Title Increase cervical rotation AROM to 50 deg or greater bilaterally to improve ability to turn head while driving    Baseline right 46 deg, left 37 deg 11/14/31    Time 6    Period Weeks    Status On-going      PT LONG TERM GOAL #3   Title Improve FOTO outcome measure score to 58% or greater functional status    Baseline 34%    Time 6    Period Weeks    Status On-going      PT LONG TERM GOAL #4   Title Decrease sleep disturbance symptoms at least 50% or greater from baseline status    Baseline ongoing    Time 6    Period Weeks    Status On-going      PT LONG TERM  GOAL #5   Title Left wrist strength 5/5 to improve ability for gripping activities/IADLs    Baseline 4+/5    Time 6    Period Weeks    Status On-going      PT LONG TERM GOAL #6   Title Perform ADLs/IADLs as needed with neck pain <4/10 at worst    Baseline 6/10 pain    Time 6    Period Weeks    Status On-going                 Plan - 11/27/20 1011    Clinical Impression Statement pt notes she is doing better today but continues to have pain and stiffness in the Rupper trap. continued TPDN focusing on the R upper trap followed with IASTM tehcniques and trialed inhibition taping for the upper trap which she noted relief of tension. continued working on posterior shoulder strengthening which she responded well to and updated HEP today.    PT Treatment/Interventions ADLs/Self Care Home Management;Cryotherapy;Electrical Stimulation;Traction;Moist Heat;Therapeutic exercise;Patient/family education;Manual techniques;Dry needling;Neuromuscular re-education;Therapeutic activities;Taping    PT Next Visit Plan continue progression neck ROM, stabilization, postural strengthening, manual as needed, further dry needling prn    PT Home Exercise Plan Access code: [email or web address]           Patient will benefit from skilled therapeutic intervention in order to improve the following deficits and impairments:  Pain,Impaired flexibility,Decreased activity tolerance,Hypomobility,Increased muscle spasms,Decreased range of motion,Decreased strength  Visit Diagnosis: Cervicalgia     Problem List Patient Active Problem List   Diagnosis Date Noted  . Degenerative disc disease, cervical 10/13/2020  . Neck pain 10/13/2020  . Stress incontinence of urine 10/31/2018  . History of diverticulosis 05/04/2017  . History of PSVT (paroxysmal supraventricular tachycardia) 06/24/2012  . Multinodular goiter 05/12/2012  . SLEEP APNEA 01/19/2008  . IRRITABLE BOWEL SYNDROME 01/18/2008    Starr Lake PT, DPT,  LAT, ATC  11/27/20  10:13 AM      [facility] [address] [place], [place], [address] Phone: [phone]   Fax:  [phone]  Name: [name] MRN: [id number] Date of Birth: Nov 02, 1960

## 2020-12-02 ENCOUNTER — Ambulatory Visit: Payer: 59 | Admitting: Physical Therapy

## 2020-12-02 ENCOUNTER — Other Ambulatory Visit: Payer: Self-pay

## 2020-12-02 ENCOUNTER — Encounter: Payer: Self-pay | Admitting: Physical Therapy

## 2020-12-02 DIAGNOSIS — M542 Cervicalgia: Secondary | ICD-10-CM | POA: Diagnosis not present

## 2020-12-02 NOTE — Therapy (Signed)
[place], [place], [address] Phone: [phone]   Fax:  [phone]  Physical Therapy Treatment  Patient Details  Name: [name] MRN: [id number] Date of Birth: 1961-08-10 Referring Provider (PT): Newman Pies, MD   Encounter Date: 12/02/2020   PT End of Session - 12/02/20 0938    Visit Number 6    Number of Visits 12    Date for PT Re-Evaluation 12/17/20    Authorization Type [employer]    PT Start Time [phone]   pt arrived late   PT Stop Time 1015    PT Time Calculation (min) 38 min    Activity Tolerance Patient limited by pain    Behavior During Therapy [facility] for tasks assessed/performed           Past Medical History:  Diagnosis Date  . Anemia   . Anxiety   . Colon polyp 08/28/2012   Tubular adenoma  . Dense breast   . Depression   . Genital warts   . GERD (gastroesophageal reflux disease)   . Heart murmur   . Hemorrhoids   . Hx of cardiac catheterization 2009   normal coronary arteries  . Hx of menorrhagia    Novasure  . Hypokalemia   . Hypomagnesemia   . Hypophosphatemia   . Lactose intolerance in adult   . Meniscus degeneration, right    torn r knee/ 10/2019  . Mitral valve prolapse   . Normal cardiac stress test 03/09/2012   no ischemia  . Positive PPD   . Rapid heart beat    States "irreg heart beat"  . Thyroid disease    Thyroid nodules    Past Surgical History:  Procedure Laterality Date  . CARDIAC CATHETERIZATION     x2 with normal results per pt  . NOVASURE ABLATION    . UPPER GASTROINTESTINAL ENDOSCOPY  May 2013    There were no vitals filed for this visit.   Subjective Assessment - 12/02/20 0939    Subjective " I am feeling okay I am still feeling it in the R side of the neck. It still shoots up to the side of head."    Currently in Pain? Yes    Pain Score 4     Pain Location Neck    Pain Orientation Right    Pain Descriptors / Indicators  Aching    Aggravating Factors  stress/ tense              [facility] PT Assessment - 12/02/20 0001      Assessment   Medical Diagnosis Cervicalgia    Referring Provider (PT) Newman Pies, MD      Observation/Other Assessments   Focus on Therapeutic Outcomes (FOTO)  52% function                         [facility] Adult PT Treatment/Exercise - 12/02/20 0001      Neck Exercises: Seated   Other Seated Exercise upper cervical rotaton with chin tuck1 x 10      Neck Exercises: Supine   Neck Retraction 5 reps;10 secs    Neck Retraction Limitations followed with head lift/ chin tuck 1 x 5 holding 10 seconds      Manual Therapy   Manual Therapy Passive ROM    Manual therapy comments MTPR R upper trap/ levator scapuale, suboccipital release    Passive ROM R/ L cervical rotation with gentle end range  oscillatoins    Manual Traction suboccipital release, attempted gentle traction but held due to pain/difficulty tolerating                  PT Education - 12/02/20 0946    Education Details FOTO assessment, anatomy of area involved and updated HEP    Person(s) Educated Patient    Methods Explanation    Comprehension Verbalized understanding               PT Long Term Goals - 11/13/20 1005      PT LONG TERM GOAL #1   Title Independent with HEP    Baseline updates ongoing    Time 6    Period Weeks    Status On-going      PT LONG TERM GOAL #2   Title Increase cervical rotation AROM to 50 deg or greater bilaterally to improve ability to turn head while driving    Baseline right 46 deg, left 37 deg 11/14/31    Time 6    Period Weeks    Status On-going      PT LONG TERM GOAL #3   Title Improve FOTO outcome measure score to 58% or greater functional status    Baseline 34%    Time 6    Period Weeks    Status On-going      PT LONG TERM GOAL #4   Title Decrease sleep disturbance symptoms at least 50% or greater from baseline status    Baseline ongoing     Time 6    Period Weeks    Status On-going      PT LONG TERM GOAL #5   Title Left wrist strength 5/5 to improve ability for gripping activities/IADLs    Baseline 4+/5    Time 6    Period Weeks    Status On-going      PT LONG TERM GOAL #6   Title Perform ADLs/IADLs as needed with neck pain <4/10 at worst    Baseline 6/10 pain    Time 6    Period Weeks    Status On-going                 Plan - 12/02/20 0959    Clinical Impression Statement pt reports she is still feeling sore in the R neck/ shoulder region. focused on manual trechniques with MTPR and cervical mobs/ PROM focusing on rotation to the L/R to address potential facet aggrivation. updated HEP to inclue upper cervical rotation, end of session she noted relief of tension inthe R side of the neck and improved ROM.    PT Treatment/Interventions ADLs/Self Care Home Management;Cryotherapy;Electrical Stimulation;Traction;Moist Heat;Therapeutic exercise;Patient/family education;Manual techniques;Dry needling;Neuromuscular re-education;Therapeutic activities;Taping    PT Next Visit Plan continue progression neck ROM, stabilization, postural strengthening, manual as needed, further dry needling prn, potential facet aggrivatoin on the R    PT Home Exercise Plan Access code: [email or web address]           Patient will benefit from skilled therapeutic intervention in order to improve the following deficits and impairments:  Pain,Impaired flexibility,Decreased activity tolerance,Hypomobility,Increased muscle spasms,Decreased range of motion,Decreased strength  Visit Diagnosis: Cervicalgia     Problem List Patient Active Problem List   Diagnosis Date Noted  . Degenerative disc disease, cervical 10/13/2020  . Neck pain 10/13/2020  . Stress incontinence of urine 10/31/2018  . History of diverticulosis 05/04/2017  . History of PSVT (paroxysmal supraventricular tachycardia) 06/24/2012  . Multinodular goiter 05/12/2012  . SLEEP APNEA  01/19/2008  . IRRITABLE BOWEL SYNDROME 01/18/2008    Starr Lake PT, DPT, LAT, ATC  12/02/20  10:17 AM      [facility] [address] [place], [place], [address] Phone: [phone]   Fax:  [phone]  Name: [name] MRN: [id number] Date of Birth: 1961-02-09

## 2020-12-04 ENCOUNTER — Other Ambulatory Visit: Payer: Self-pay

## 2020-12-04 ENCOUNTER — Ambulatory Visit: Payer: 59 | Admitting: Physical Therapy

## 2020-12-04 DIAGNOSIS — M542 Cervicalgia: Secondary | ICD-10-CM | POA: Diagnosis not present

## 2020-12-04 NOTE — Therapy (Signed)
[place], [place], [address] Phone: [phone]   Fax:  [phone]  Physical Therapy Treatment  Patient Details  Name: [name] MRN: [id number] Date of Birth: 09/22/1960 Referring Provider (PT): Newman Pies, MD   Encounter Date: 12/04/2020   PT End of Session - 12/04/20 0934    Visit Number 7    Number of Visits 12    Date for PT Re-Evaluation 12/17/20    Authorization Type [employer]    PT Start Time [phone]    PT Stop Time 2694    PT Time Calculation (min) 40 min    Activity Tolerance Patient tolerated treatment well           Past Medical History:  Diagnosis Date  . Anemia   . Anxiety   . Colon polyp 08/28/2012   Tubular adenoma  . Dense breast   . Depression   . Genital warts   . GERD (gastroesophageal reflux disease)   . Heart murmur   . Hemorrhoids   . Hx of cardiac catheterization 2009   normal coronary arteries  . Hx of menorrhagia    Novasure  . Hypokalemia   . Hypomagnesemia   . Hypophosphatemia   . Lactose intolerance in adult   . Meniscus degeneration, right    torn r knee/ 10/2019  . Mitral valve prolapse   . Normal cardiac stress test 03/09/2012   no ischemia  . Positive PPD   . Rapid heart beat    States "irreg heart beat"  . Thyroid disease    Thyroid nodules    Past Surgical History:  Procedure Laterality Date  . CARDIAC CATHETERIZATION     x2 with normal results per pt  . NOVASURE ABLATION    . UPPER GASTROINTESTINAL ENDOSCOPY  May 2013    There were no vitals filed for this visit.   Subjective Assessment - 12/04/20 0936    Subjective " i am doing better until look to theR in that one spot.    Patient Stated Goals Resolve neck pain    Currently in Pain? Yes    Pain Score 4     Pain Orientation Right    Pain Descriptors / Indicators Aching    Pain Type Chronic pain    Pain Onset More than a month ago    Pain Frequency Intermittent               [facility] PT Assessment - 12/04/20 0001      Assessment   Medical Diagnosis Cervicalgia    Referring Provider (PT) Newman Pies, MD                         [facility] Adult PT Treatment/Exercise - 12/04/20 0001      Neck Exercises: Machines for Strengthening   UBE (Upper Arm Bike) L1 x 6 min    Nustep L3 x 4 min   fwd/bwd x 2 min ea.     Neck Exercises: Seated   Other Seated Exercise self-SNAG cervical rotation with strap x 5 ea. way bilat.    Other Seated Exercise upper cervical rotaton with chin tuck1 x 10      Manual Therapy   Manual therapy comments skilled palpation and monitoring of pt throughout TPDN    Joint Mobilization C3- C5  PA and L UPA    Soft tissue mobilization R upper trap / levator scapulae and cervical parapsinals  Neck Exercises: Stretches   Upper Trapezius Stretch Right;1 rep;30 seconds            Trigger Point Dry Needling - 12/04/20 0001    Consent Given? Yes    Education Handout Provided Previously provided    Muscles Treated Head and Neck Cervical multifidi;Levator scapulae    Upper Trapezius Response Twitch reponse elicited;Palpable increased muscle length   R   Levator Scapulae Response Twitch response elicited;Palpable increased muscle length   R   Cervical multifidi Response Twitch reponse elicited;Palpable increased muscle length   C7 - C8               PT Education - 12/04/20 1013    Education Details Reviewed HEp regarding snags try focusing more on upper cervical rotation, SNAG, and upper trap / levator scapulae stretch.    Person(s) Educated Patient    Methods Explanation;Verbal cues    Comprehension Verbalized understanding;Verbal cues required               PT Long Term Goals - 11/13/20 1005      PT LONG TERM GOAL #1   Title Independent with HEP    Baseline updates ongoing    Time 6    Period Weeks    Status On-going      PT LONG TERM GOAL #2   Title Increase cervical rotation AROM  to 50 deg or greater bilaterally to improve ability to turn head while driving    Baseline right 46 deg, left 37 deg 11/14/31    Time 6    Period Weeks    Status On-going      PT LONG TERM GOAL #3   Title Improve FOTO outcome measure score to 58% or greater functional status    Baseline 34%    Time 6    Period Weeks    Status On-going      PT LONG TERM GOAL #4   Title Decrease sleep disturbance symptoms at least 50% or greater from baseline status    Baseline ongoing    Time 6    Period Weeks    Status On-going      PT LONG TERM GOAL #5   Title Left wrist strength 5/5 to improve ability for gripping activities/IADLs    Baseline 4+/5    Time 6    Period Weeks    Status On-going      PT LONG TERM GOAL #6   Title Perform ADLs/IADLs as needed with neck pain <4/10 at worst    Baseline 6/10 pain    Time 6    Period Weeks    Status On-going                 Plan - 12/04/20 1010    Clinical Impression Statement pt reports limited improvement since the last session with report of 4/10 pain that occurs mostly with R rotation. continued TPDN focusing on the R upper trap/ levator scapulae and cervical mulitifidi followed with IASTM techniques. Focused session on potential facet involved with L unilateral Cervical mobs. reviewed SNAG exercise with focus on gapping the R which she noted significant relief of "pinching" with she turns to the R. end of session she noted pain dropped to a 3/10 with some soreness which some could be potentially related to DN.    PT Treatment/Interventions ADLs/Self Care Home Management;Cryotherapy;Electrical Stimulation;Traction;Moist Heat;Therapeutic exercise;Patient/family education;Manual techniques;Dry needling;Neuromuscular re-education;Therapeutic activities;Taping    PT Next Visit Plan continue progression neck ROM, stabilization, postural  strengthening, manual as needed, further dry needling prn, potential facet aggrivatoin on the R    PT Home  Exercise Plan Access code: [email or web address]    Consulted and Agree with Plan of Care Patient           Patient will benefit from skilled therapeutic intervention in order to improve the following deficits and impairments:  Pain,Impaired flexibility,Decreased activity tolerance,Hypomobility,Increased muscle spasms,Decreased range of motion,Decreased strength  Visit Diagnosis: Cervicalgia     Problem List Patient Active Problem List   Diagnosis Date Noted  . Degenerative disc disease, cervical 10/13/2020  . Neck pain 10/13/2020  . Stress incontinence of urine 10/31/2018  . History of diverticulosis 05/04/2017  . History of PSVT (paroxysmal supraventricular tachycardia) 06/24/2012  . Multinodular goiter 05/12/2012  . SLEEP APNEA 01/19/2008  . IRRITABLE BOWEL SYNDROME 01/18/2008   Starr Lake PT, DPT, LAT, ATC  12/04/20  10:15 AM      [facility] [address] [place], [place], [address] Phone: [phone]   Fax:  [phone]  Name: [name] MRN: [id number] Date of Birth: 1961-01-26

## 2020-12-09 ENCOUNTER — Other Ambulatory Visit: Payer: Self-pay

## 2020-12-09 ENCOUNTER — Encounter: Payer: Self-pay | Admitting: Physical Therapy

## 2020-12-09 ENCOUNTER — Ambulatory Visit: Payer: 59 | Admitting: Physical Therapy

## 2020-12-09 DIAGNOSIS — M542 Cervicalgia: Secondary | ICD-10-CM | POA: Diagnosis not present

## 2020-12-09 NOTE — Therapy (Signed)
in prone using prone pillow   Manual therapy comments MTPR along the splenius capitus and the cervilca multifdus on the R    Joint Mobilization C3- C5   L UPA                  PT Education - 12/09/20 1010    Education Details Reviewed HEP    Person(s) Educated Patient    Methods Explanation    Comprehension Verbalized understanding               PT Long Term Goals - 12/09/20 0945      PT LONG TERM GOAL #1   Title Independent with HEP    Period Weeks    Status Achieved      PT LONG TERM GOAL #2   Title Increase cervical rotation AROM to 50 deg or greater bilaterally to improve ability to turn head while driving    Period Weeks    Status Not Met      PT LONG TERM GOAL #3   Title Improve FOTO outcome measure score to 58% or greater functional status    Period Weeks    Status Partially Met      PT LONG TERM GOAL #4   Title Decrease sleep disturbance symptoms at least 50% or greater from baseline status    Period Weeks    Status Not Met      PT LONG TERM GOAL #5   Title Left wrist strength 5/5 to improve ability for gripping activities/IADLs    Period Weeks    Status Not Met      PT LONG TERM GOAL #6    Title Perform ADLs/IADLs as needed with neck pain <4/10 at worst    Period Weeks    Status Not Met                 Plan - 12/09/20 1007    Clinical Impression Statement [name] continues to report 4/10 pain with it increasing to 7/10 with cervical rotation especially to the R. She continues to demo tenderness along cervical paraspinals and tenderness along facet joints located aroudn C3-C4.  Limited progress has been made to her goals and with the continued pain and limited functional progress it would be inthe patients best benefit to return to her MD for further assessment, and she will be discharged from PT today.    PT Next Visit Plan d/C           Patient will benefit from skilled therapeutic intervention in order to improve the following deficits and impairments:  Pain,Impaired flexibility,Decreased activity tolerance,Hypomobility,Increased muscle spasms,Decreased range of motion,Decreased strength  Visit Diagnosis: Cervicalgia     Problem List Patient Active Problem List   Diagnosis Date Noted  . Degenerative disc disease, cervical 10/13/2020  . Neck pain 10/13/2020  . Stress incontinence of urine 10/31/2018  . History of diverticulosis 05/04/2017  . History of PSVT (paroxysmal supraventricular tachycardia) 06/24/2012  . Multinodular goiter 05/12/2012  . SLEEP APNEA 01/19/2008  . IRRITABLE BOWEL SYNDROME 01/18/2008    Starr Lake 12/09/2020, 10:13 AM  [facility] [address] [place], [place], [address] Phone: [phone]   Fax:  [phone]  Name: [name] MRN: [id number] Date of Birth: 1961/01/20      PHYSICAL THERAPY DISCHARGE SUMMARY  Visits from Start of Care: 8  Current functional level related to goals / functional outcomes: See goals, FOTO 52%   Remaining deficits: See assessment  in prone using prone pillow   Manual therapy comments MTPR along the splenius capitus and the cervilca multifdus on the R    Joint Mobilization C3- C5   L UPA                  PT Education - 12/09/20 1010    Education Details Reviewed HEP    Person(s) Educated Patient    Methods Explanation    Comprehension Verbalized understanding               PT Long Term Goals - 12/09/20 0945      PT LONG TERM GOAL #1   Title Independent with HEP    Period Weeks    Status Achieved      PT LONG TERM GOAL #2   Title Increase cervical rotation AROM to 50 deg or greater bilaterally to improve ability to turn head while driving    Period Weeks    Status Not Met      PT LONG TERM GOAL #3   Title Improve FOTO outcome measure score to 58% or greater functional status    Period Weeks    Status Partially Met      PT LONG TERM GOAL #4   Title Decrease sleep disturbance symptoms at least 50% or greater from baseline status    Period Weeks    Status Not Met      PT LONG TERM GOAL #5   Title Left wrist strength 5/5 to improve ability for gripping activities/IADLs    Period Weeks    Status Not Met      PT LONG TERM GOAL #6    Title Perform ADLs/IADLs as needed with neck pain <4/10 at worst    Period Weeks    Status Not Met                 Plan - 12/09/20 1007    Clinical Impression Statement [name] continues to report 4/10 pain with it increasing to 7/10 with cervical rotation especially to the R. She continues to demo tenderness along cervical paraspinals and tenderness along facet joints located aroudn C3-C4.  Limited progress has been made to her goals and with the continued pain and limited functional progress it would be inthe patients best benefit to return to her MD for further assessment, and she will be discharged from PT today.    PT Next Visit Plan d/C           Patient will benefit from skilled therapeutic intervention in order to improve the following deficits and impairments:  Pain,Impaired flexibility,Decreased activity tolerance,Hypomobility,Increased muscle spasms,Decreased range of motion,Decreased strength  Visit Diagnosis: Cervicalgia     Problem List Patient Active Problem List   Diagnosis Date Noted  . Degenerative disc disease, cervical 10/13/2020  . Neck pain 10/13/2020  . Stress incontinence of urine 10/31/2018  . History of diverticulosis 05/04/2017  . History of PSVT (paroxysmal supraventricular tachycardia) 06/24/2012  . Multinodular goiter 05/12/2012  . SLEEP APNEA 01/19/2008  . IRRITABLE BOWEL SYNDROME 01/18/2008    Starr Lake 12/09/2020, 10:13 AM  [facility] [address] [place], [place], [address] Phone: [phone]   Fax:  [phone]  Name: [name] MRN: [id number] Date of Birth: 1961/01/20      PHYSICAL THERAPY DISCHARGE SUMMARY  Visits from Start of Care: 8  Current functional level related to goals / functional outcomes: See goals, FOTO 52%   Remaining deficits: See assessment  Education / Equipment: HEP, theraband, posture,   Plan: Patient agrees to  discharge.  Patient goals were partially met. Patient is being discharged due to not returning since the last visit.  ?????        Milani Lowenstein PT, DPT, LAT, ATC  12/09/20  10:14 AM

## 2020-12-11 ENCOUNTER — Ambulatory Visit: Payer: 59 | Admitting: Physical Therapy

## 2020-12-16 ENCOUNTER — Encounter: Payer: 59 | Admitting: Physical Therapy

## 2020-12-18 ENCOUNTER — Ambulatory Visit: Payer: 59 | Admitting: Physical Therapy

## 2020-12-29 DIAGNOSIS — F411 Generalized anxiety disorder: Secondary | ICD-10-CM | POA: Diagnosis not present

## 2020-12-29 DIAGNOSIS — F4310 Post-traumatic stress disorder, unspecified: Secondary | ICD-10-CM | POA: Diagnosis not present

## 2020-12-29 DIAGNOSIS — R69 Illness, unspecified: Secondary | ICD-10-CM | POA: Diagnosis not present

## 2021-01-20 DIAGNOSIS — F411 Generalized anxiety disorder: Secondary | ICD-10-CM | POA: Diagnosis not present

## 2021-01-20 DIAGNOSIS — R69 Illness, unspecified: Secondary | ICD-10-CM | POA: Diagnosis not present

## 2021-01-20 DIAGNOSIS — F4310 Post-traumatic stress disorder, unspecified: Secondary | ICD-10-CM | POA: Diagnosis not present

## 2021-02-03 DIAGNOSIS — R69 Illness, unspecified: Secondary | ICD-10-CM | POA: Diagnosis not present

## 2021-02-03 DIAGNOSIS — F411 Generalized anxiety disorder: Secondary | ICD-10-CM | POA: Diagnosis not present

## 2021-02-03 DIAGNOSIS — F4310 Post-traumatic stress disorder, unspecified: Secondary | ICD-10-CM | POA: Diagnosis not present

## 2021-02-10 ENCOUNTER — Other Ambulatory Visit: Payer: Self-pay | Admitting: Emergency Medicine

## 2021-02-10 DIAGNOSIS — M25561 Pain in right knee: Secondary | ICD-10-CM | POA: Diagnosis not present

## 2021-02-10 DIAGNOSIS — E876 Hypokalemia: Secondary | ICD-10-CM

## 2021-02-26 ENCOUNTER — Other Ambulatory Visit: Payer: Self-pay | Admitting: Emergency Medicine

## 2021-02-26 DIAGNOSIS — R072 Precordial pain: Secondary | ICD-10-CM | POA: Diagnosis not present

## 2021-02-26 DIAGNOSIS — Z1231 Encounter for screening mammogram for malignant neoplasm of breast: Secondary | ICD-10-CM

## 2021-02-26 DIAGNOSIS — R002 Palpitations: Secondary | ICD-10-CM | POA: Diagnosis not present

## 2021-02-26 DIAGNOSIS — E663 Overweight: Secondary | ICD-10-CM | POA: Diagnosis not present

## 2021-02-26 DIAGNOSIS — R0602 Shortness of breath: Secondary | ICD-10-CM | POA: Diagnosis not present

## 2021-03-12 DIAGNOSIS — F4310 Post-traumatic stress disorder, unspecified: Secondary | ICD-10-CM | POA: Diagnosis not present

## 2021-03-12 DIAGNOSIS — R69 Illness, unspecified: Secondary | ICD-10-CM | POA: Diagnosis not present

## 2021-03-12 DIAGNOSIS — F411 Generalized anxiety disorder: Secondary | ICD-10-CM | POA: Diagnosis not present

## 2021-03-30 ENCOUNTER — Ambulatory Visit: Payer: Self-pay | Admitting: Emergency Medicine

## 2021-03-31 ENCOUNTER — Ambulatory Visit (INDEPENDENT_AMBULATORY_CARE_PROVIDER_SITE_OTHER): Payer: 59 | Admitting: Emergency Medicine

## 2021-03-31 ENCOUNTER — Other Ambulatory Visit: Payer: Self-pay

## 2021-03-31 ENCOUNTER — Encounter: Payer: Self-pay | Admitting: Emergency Medicine

## 2021-03-31 VITALS — BP 124/76 | HR 67 | Temp 98.0°F | Ht 66.0 in | Wt 204.0 lb

## 2021-03-31 DIAGNOSIS — Z8679 Personal history of other diseases of the circulatory system: Secondary | ICD-10-CM | POA: Diagnosis not present

## 2021-03-31 DIAGNOSIS — I471 Supraventricular tachycardia: Secondary | ICD-10-CM | POA: Insufficient documentation

## 2021-03-31 DIAGNOSIS — Z8719 Personal history of other diseases of the digestive system: Secondary | ICD-10-CM

## 2021-03-31 DIAGNOSIS — R002 Palpitations: Secondary | ICD-10-CM | POA: Diagnosis not present

## 2021-03-31 DIAGNOSIS — M5030 Other cervical disc degeneration, unspecified cervical region: Secondary | ICD-10-CM | POA: Diagnosis not present

## 2021-03-31 NOTE — Progress Notes (Signed)
[name] 60 y.o.   Chief Complaint  Patient presents with   Follow-up    6 month    HISTORY OF PRESENT ILLNESS: This is a 60 y.o. female here for 80-month follow-up. Doing well but complaining of intermittent palpitations. Has cardiology appointment coming up. No other complaints or medical concerns. Takes losartan 50 mg as needed for blood pressure and metoprolol succinate 25 mg daily. Takes 1 baby aspirin daily.   HPI   Prior to Admission medications   Medication Sig Start Date End Date Taking? Authorizing Provider  ALPRAZolam Duanne Moron) 1 MG tablet Take 1 tablet 1 hour before procedure. 11/10/20  Yes Kaimana Lurz, Ines Bloomer, MD  Ascorbic Acid (VITAMIN C) 1000 MG tablet Take 2,000 mg by mouth at bedtime.   Yes [provider]  aspirin EC 81 MG EC tablet Take 1 tablet (81 mg total) by mouth daily. 10/21/19  Yes Dixie Dials, MD  Calcium Carb-Cholecalciferol (OYSTER SHELL CALCIUM 250+D PO) Take 2 tablets by mouth at bedtime.   Yes [provider]  Cholecalciferol (VITAMIN D) 50 MCG (2000 UT) tablet Take 4,000 Units by mouth at bedtime.    Yes [provider]  fluticasone (FLONASE) 50 MCG/ACT nasal spray Place 1 spray into both nostrils at bedtime as needed for allergies or rhinitis.    Yes [provider]  loratadine (CLARITIN) 10 MG tablet Take 10 mg by mouth daily as needed for allergies or rhinitis.    Yes [provider]  losartan (COZAAR) 50 MG tablet Take 25 mg by mouth 2 (two) times daily.   Yes [provider]  meloxicam (MOBIC) 15 MG tablet Take 1 tablet (15 mg total) by mouth daily. 10/13/20  Yes Keaundre Thelin, Ines Bloomer, MD  metoprolol succinate (TOPROL-XL) 25 MG 24 hr tablet Take 25 mg by mouth 2 (two) times daily.    Yes [provider]  Multiple Vitamins-Minerals (ZINC PO) Take 1 tablet by mouth at bedtime.   Yes [provider]  PARoxetine (PAXIL-CR) 25 MG 24 hr tablet Take 25 mg by mouth daily.    Yes [provider]  Polyethyl Glycol-Propyl Glycol (SYSTANE OP) Place 2 drops into both eyes daily.   Yes [provider]  potassium chloride SA (KLOR-CON) 20 MEQ tablet TAKE 2 TABLETS(40 MEQ) BY MOUTH DAILY 11/24/20  Yes Avana Kreiser, Ines Bloomer, MD  TURMERIC PO Take 1 capsule by mouth daily as needed (pain/inflammation).    Yes [provider]  cyclobenzaprine (FLEXERIL) 5 MG tablet Take 1 tablet (5 mg total) by mouth at bedtime as needed for muscle spasms. Patient not taking: No sig reported 07/17/20   Just, Laurita Quint, FNP  OVER THE COUNTER MEDICATION Take 1 capsule by mouth daily. Soothe and Heal for joint pain  Patient not taking: No sig reported    [provider]    Allergies  Allergen Reactions   Ceftriaxone Sodium Itching   Doxycycline Rash    Patient Active Problem List   Diagnosis Date Noted   Degenerative disc disease, cervical 10/13/2020   Neck pain 10/13/2020   Stress incontinence of urine 10/31/2018   History of diverticulosis 05/04/2017   History of PSVT (paroxysmal supraventricular tachycardia) 06/24/2012   Multinodular goiter 05/12/2012   SLEEP APNEA 01/19/2008   IRRITABLE BOWEL SYNDROME 01/18/2008    Past Medical History:  Diagnosis Date   Anemia    Anxiety    Colon polyp 08/28/2012   Tubular adenoma   Dense breast    Depression  Genital warts    GERD (gastroesophageal reflux disease)    Heart murmur    Hemorrhoids    Hx of cardiac catheterization 2009   normal coronary arteries   Hx of menorrhagia    Novasure   Hypokalemia    Hypomagnesemia    Hypophosphatemia    Lactose intolerance in adult    Meniscus degeneration, right    torn r knee/ 10/2019   Mitral valve prolapse    Normal cardiac stress test 03/09/2012   no ischemia   Positive PPD    Rapid heart beat    States "irreg heart beat"   Thyroid disease    Thyroid nodules    Past Surgical History:  Procedure Laterality Date   CARDIAC CATHETERIZATION      x2 with normal results per pt   NOVASURE ABLATION     UPPER GASTROINTESTINAL ENDOSCOPY  May 2013    Social History   Socioeconomic History   Marital status: Divorced    Spouse name: Not on file   Number of children: 2   Years of education: 16   Highest education level: Not on file  Occupational History   Occupation: dialysis Engineer, production: [employer]  Tobacco Use   Smoking status: Never   Smokeless tobacco: Never  Vaping Use   Vaping Use: Never used  Substance and Sexual Activity   Alcohol use: No   Drug use: No   Sexual activity: Yes  Other Topics Concern   Not on file  Social History Narrative   Ms. Purewal is a divorced [place] American female who works as a Engineer, 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 She lived in [place] and in [place] for a number of years her mom is from [place]. 11 years of education went to college at [employer] lives at home with her son who is in his 58s no pets   Neg ets tob etoh hx PA   6 hours of sleep   G2P2    TD2010  colonoscopy 2009   Now running  own business    HH of 2    Left handed    Caffeine use: tea sometimes         Social Determinants of Health   Financial Resource Strain: Not on file  Food Insecurity: Not on file  Transportation Needs: Not on file  Physical Activity: Not on file  Stress: Not on file  Social Connections: Not on file  Intimate Partner Violence: Not on file    Family History  Problem Relation Age of Onset   Heart disease Father        died Mi 21   Hypertension Maternal Grandfather    Diabetes Maternal Grandfather    Heart disease Maternal Grandfather    Colon cancer Maternal Grandfather    Rectal cancer Maternal Grandfather    Diabetes Maternal Grandmother    Prostate cancer Neg Hx    Breast cancer Neg Hx      Review of Systems  Constitutional: Negative.  Negative for chills and fever.  HENT:  Negative for congestion  and sore throat.   Respiratory: Negative.  Negative for cough and shortness of breath.   Cardiovascular:  Positive for palpitations. Negative for chest pain.  Gastrointestinal:  Negative for abdominal pain, diarrhea, nausea and vomiting.  Genitourinary: Negative.  Negative for dysuria and hematuria.  Musculoskeletal: Negative.   Skin: Negative.  Negative for rash.  Neurological:  Negative.  Negative for dizziness and headaches.  All other systems reviewed and are negative.  Today's Vitals   03/31/21 1600  BP: 124/76  Pulse: 67  Temp: 98 F (36.7 C)  TempSrc: Oral  SpO2: 99%  Weight: 204 lb (92.5 kg)  Height: 5\' 6"  (1.676 m)   Body mass index is 32.93 kg/m. Wt Readings from Last 3 Encounters:  03/31/21 204 lb (92.5 kg)  11/10/20 202 lb (91.6 kg)  10/13/20 199 lb (90.3 kg)    Physical Exam Vitals reviewed.  Constitutional:      Appearance: Normal appearance.  HENT:     Head: Normocephalic.  Eyes:     Extraocular Movements: Extraocular movements intact.     Conjunctiva/sclera: Conjunctivae normal.     Pupils: Pupils are equal, round, and reactive to light.  Cardiovascular:     Rate and Rhythm: Normal rate and regular rhythm.     Pulses: Normal pulses.     Heart sounds: Normal heart sounds.  Pulmonary:     Effort: Pulmonary effort is normal.     Breath sounds: Normal breath sounds.  Musculoskeletal:        General: Normal range of motion.     Cervical back: Normal range of motion and neck supple.  Skin:    General: Skin is warm and dry.     Capillary Refill: Capillary refill takes less than 2 seconds.  Neurological:     General: No focal deficit present.     Mental Status: She is alert and oriented to person, place, and time.  Psychiatric:        Mood and Affect: Mood normal.        Behavior: Behavior normal.     ASSESSMENT & PLAN: A total of 30 minutes was spent with the patient and counseling/coordination of care regarding preparing for this visit, review of  most recent office visit notes, review of most recent blood work results, review of all medications, differential diagnosis of palpitations and need to follow-up with cardiologist, health maintenance items, education on nutrition, need for blood work, prognosis, documentation and need for follow-up.  Teisha was seen today for follow-up.  Diagnoses and all orders for this visit:  Palpitations -     CBC with Differential/Platelet -     Comprehensive metabolic panel -     TSH -     Hemoglobin A1c -     Lipid panel  History of PSVT (paroxysmal supraventricular tachycardia)  History of diverticulosis  Degenerative disc disease, cervical  Clinically stable.  No red flag signs or symptoms.  Needs to follow-up with cardiologist for evaluation of palpitations. Differential diagnosis discussed with patient.  Diet and nutrition discussed. ED precautions given.  Advised to contact the office if no better or worse during the next several days.  Patient Instructions  Palpitations Palpitations are feelings that your heartbeat is not normal. Your heartbeat may feel like it is: Uneven. Faster than normal. Fluttering. Skipping a beat. This is usually not a serious problem. In some cases, you may need tests torule out any serious problems. Follow these instructions at home: Pay attention to any changes in your condition. Take these actions to helpmanage your symptoms: Eating and drinking Avoid: Coffee, tea, soft drinks, and energy drinks. Chocolate. Alcohol. Diet pills. Lifestyle  Try to lower your stress. These things can help you relax: Yoga. Deep breathing and meditation. Exercise. Using words and images to create positive thoughts (guided imagery). Using your mind to control things in  your body (biofeedback). Do not use drugs. Get plenty of rest and sleep. Keep a regular bed time.  General instructions  Take over-the-counter and prescription medicines only as told by your  doctor. Do not use any products that contain nicotine or tobacco, such as cigarettes and e-cigarettes. If you need help quitting, ask your doctor. Keep all follow-up visits as told by your doctor. This is important. You may need more tests if palpitations do not go away or get worse.  Contact a doctor if: Your symptoms last more than 24 hours. Your symptoms occur more often. Get help right away if you: Have chest pain. Feel short of breath. Have a very bad headache. Feel dizzy. Pass out (faint). Summary Palpitations are feelings that your heartbeat is uneven or faster than normal. It may feel like your heart is fluttering or skipping a beat. Avoid food and drinks that may cause palpitations. These include caffeine, chocolate, and alcohol. Try to lower your stress. Do not smoke or use drugs. Get help right away if you faint or have chest pain, shortness of breath, a severe headache, or dizziness. This information is not intended to replace advice given to you by your health care provider. Make sure you discuss any questions you have with your healthcare provider. Document Revised: 10/12/2017 Document Reviewed: 10/12/2017 Elsevier Patient Education  2022 [place], MD [facility]

## 2021-03-31 NOTE — Patient Instructions (Signed)
Palpitations Palpitations are feelings that your heartbeat is not normal. Your heartbeat may feel like it is: Uneven. Faster than normal. Fluttering. Skipping a beat. This is usually not a serious problem. In some cases, you may need tests torule out any serious problems. Follow these instructions at home: Pay attention to any changes in your condition. Take these actions to helpmanage your symptoms: Eating and drinking Avoid: Coffee, tea, soft drinks, and energy drinks. Chocolate. Alcohol. Diet pills. Lifestyle  Try to lower your stress. These things can help you relax: Yoga. Deep breathing and meditation. Exercise. Using words and images to create positive thoughts (guided imagery). Using your mind to control things in your body (biofeedback). Do not use drugs. Get plenty of rest and sleep. Keep a regular bed time.  General instructions  Take over-the-counter and prescription medicines only as told by your doctor. Do not use any products that contain nicotine or tobacco, such as cigarettes and e-cigarettes. If you need help quitting, ask your doctor. Keep all follow-up visits as told by your doctor. This is important. You may need more tests if palpitations do not go away or get worse.  Contact a doctor if: Your symptoms last more than 24 hours. Your symptoms occur more often. Get help right away if you: Have chest pain. Feel short of breath. Have a very bad headache. Feel dizzy. Pass out (faint). Summary Palpitations are feelings that your heartbeat is uneven or faster than normal. It may feel like your heart is fluttering or skipping a beat. Avoid food and drinks that may cause palpitations. These include caffeine, chocolate, and alcohol. Try to lower your stress. Do not smoke or use drugs. Get help right away if you faint or have chest pain, shortness of breath, a severe headache, or dizziness. This information is not intended to replace advice given to you by your  health care provider. Make sure you discuss any questions you have with your healthcare provider. Document Revised: 10/12/2017 Document Reviewed: 10/12/2017 Elsevier Patient Education  2022 Elsevier Inc.  

## 2021-04-01 LAB — CBC WITH DIFFERENTIAL/PLATELET
Basophils Absolute: 0.1 10*3/uL (ref 0.0–0.1)
Basophils Relative: 0.9 % (ref 0.0–3.0)
Eosinophils Absolute: 0.2 10*3/uL (ref 0.0–0.7)
Eosinophils Relative: 2.6 % (ref 0.0–5.0)
HCT: 39.8 % (ref 36.0–46.0)
Hemoglobin: 12.7 g/dL (ref 12.0–15.0)
Lymphocytes Relative: 31 % (ref 12.0–46.0)
Lymphs Abs: 1.9 10*3/uL (ref 0.7–4.0)
MCHC: 31.9 g/dL (ref 30.0–36.0)
MCV: 75.3 fl — ABNORMAL LOW (ref 78.0–100.0)
Monocytes Absolute: 0.8 10*3/uL (ref 0.1–1.0)
Monocytes Relative: 12.6 % — ABNORMAL HIGH (ref 3.0–12.0)
Neutro Abs: 3.3 10*3/uL (ref 1.4–7.7)
Neutrophils Relative %: 52.9 % (ref 43.0–77.0)
Platelets: 177 10*3/uL (ref 150.0–400.0)
RBC: 5.29 Mil/uL — ABNORMAL HIGH (ref 3.87–5.11)
RDW: 14.4 % (ref 11.5–15.5)
WBC: 6.3 10*3/uL (ref 4.0–10.5)

## 2021-04-01 LAB — HEMOGLOBIN A1C: Hgb A1c MFr Bld: 6.1 % (ref 4.6–6.5)

## 2021-04-01 LAB — COMPREHENSIVE METABOLIC PANEL
ALT: 17 U/L (ref 0–35)
AST: 19 U/L (ref 0–37)
Albumin: 4.1 g/dL (ref 3.5–5.2)
Alkaline Phosphatase: 59 U/L (ref 39–117)
BUN: 13 mg/dL (ref 6–23)
CO2: 28 mEq/L (ref 19–32)
Calcium: 9.2 mg/dL (ref 8.4–10.5)
Chloride: 106 mEq/L (ref 96–112)
Creatinine, Ser: 0.85 mg/dL (ref 0.40–1.20)
GFR: 73.74 mL/min (ref >60.00)
Glucose, Bld: 93 mg/dL (ref 70–99)
Potassium: 3.9 mEq/L (ref 3.5–5.1)
Sodium: 142 mEq/L (ref 135–145)
Total Bilirubin: 0.5 mg/dL (ref 0.2–1.2)
Total Protein: 6.5 g/dL (ref 6.0–8.3)

## 2021-04-01 LAB — LIPID PANEL
Cholesterol: 136 mg/dL (ref 0–200)
HDL: 40.5 mg/dL (ref >39.00)
LDL Cholesterol: 58 mg/dL (ref 0–99)
NonHDL: 95.9
Total CHOL/HDL Ratio: 3
Triglycerides: 192 mg/dL — ABNORMAL HIGH (ref 0.0–149.0)
VLDL: 38.4 mg/dL (ref 0.0–40.0)

## 2021-04-01 LAB — TSH: TSH: 1.15 u[IU]/mL (ref 0.35–5.50)

## 2021-04-07 DIAGNOSIS — F411 Generalized anxiety disorder: Secondary | ICD-10-CM | POA: Diagnosis not present

## 2021-04-07 DIAGNOSIS — R69 Illness, unspecified: Secondary | ICD-10-CM | POA: Diagnosis not present

## 2021-04-07 DIAGNOSIS — F4310 Post-traumatic stress disorder, unspecified: Secondary | ICD-10-CM | POA: Diagnosis not present

## 2021-04-16 ENCOUNTER — Other Ambulatory Visit: Payer: Self-pay | Admitting: Emergency Medicine

## 2021-04-16 DIAGNOSIS — E876 Hypokalemia: Secondary | ICD-10-CM

## 2021-04-22 ENCOUNTER — Other Ambulatory Visit: Payer: Self-pay | Admitting: Obstetrics and Gynecology

## 2021-04-22 ENCOUNTER — Ambulatory Visit
Admission: RE | Admit: 2021-04-22 | Discharge: 2021-04-22 | Disposition: A | Discharge status: Home / Self Care | Payer: PRIVATE HEALTH INSURANCE | Source: Ambulatory Visit | Attending: Emergency Medicine | Admitting: Emergency Medicine

## 2021-04-22 ENCOUNTER — Other Ambulatory Visit: Payer: Self-pay

## 2021-04-22 DIAGNOSIS — Z1231 Encounter for screening mammogram for malignant neoplasm of breast: Secondary | ICD-10-CM

## 2021-05-21 ENCOUNTER — Encounter (HOSPITAL_COMMUNITY): Payer: Self-pay | Admitting: Emergency Medicine

## 2021-05-21 ENCOUNTER — Emergency Department (HOSPITAL_COMMUNITY)
Admission: EM | Admit: 2021-05-21 | Discharge: 2021-05-21 | Disposition: A | Discharge status: Home / Self Care | Payer: 59 | Attending: Emergency Medicine | Admitting: Emergency Medicine

## 2021-05-21 ENCOUNTER — Ambulatory Visit (HOSPITAL_COMMUNITY)
Admission: EM | Admit: 2021-05-21 | Discharge: 2021-05-21 | Disposition: A | Discharge status: Home / Self Care | Payer: PRIVATE HEALTH INSURANCE | Attending: Emergency Medicine | Admitting: Emergency Medicine

## 2021-05-21 ENCOUNTER — Emergency Department (HOSPITAL_COMMUNITY): Payer: 59

## 2021-05-21 ENCOUNTER — Other Ambulatory Visit: Payer: Self-pay

## 2021-05-21 DIAGNOSIS — R0602 Shortness of breath: Secondary | ICD-10-CM | POA: Insufficient documentation

## 2021-05-21 DIAGNOSIS — R002 Palpitations: Secondary | ICD-10-CM | POA: Diagnosis not present

## 2021-05-21 DIAGNOSIS — R202 Paresthesia of skin: Secondary | ICD-10-CM

## 2021-05-21 DIAGNOSIS — R55 Syncope and collapse: Secondary | ICD-10-CM

## 2021-05-21 DIAGNOSIS — I2510 Atherosclerotic heart disease of native coronary artery without angina pectoris: Secondary | ICD-10-CM | POA: Insufficient documentation

## 2021-05-21 DIAGNOSIS — R42 Dizziness and giddiness: Secondary | ICD-10-CM

## 2021-05-21 DIAGNOSIS — Z7982 Long term (current) use of aspirin: Secondary | ICD-10-CM | POA: Diagnosis not present

## 2021-05-21 DIAGNOSIS — H539 Unspecified visual disturbance: Secondary | ICD-10-CM | POA: Diagnosis not present

## 2021-05-21 LAB — CBC WITH DIFFERENTIAL/PLATELET
Abs Immature Granulocytes: 0.02 10*3/uL (ref 0.00–0.07)
Basophils Absolute: 0 10*3/uL (ref 0.0–0.1)
Basophils Relative: 1 %
Eosinophils Absolute: 0.1 10*3/uL (ref 0.0–0.5)
Eosinophils Relative: 1 %
HCT: 41.2 % (ref 36.0–46.0)
Hemoglobin: 13.1 g/dL (ref 12.0–15.0)
Immature Granulocytes: 0 %
Lymphocytes Relative: 26 %
Lymphs Abs: 1.4 10*3/uL (ref 0.7–4.0)
MCH: 23.9 pg — ABNORMAL LOW (ref 26.0–34.0)
MCHC: 31.8 g/dL (ref 30.0–36.0)
MCV: 75 fL — ABNORMAL LOW (ref 80.0–100.0)
Monocytes Absolute: 0.6 10*3/uL (ref 0.1–1.0)
Monocytes Relative: 13 %
Neutro Abs: 3 10*3/uL (ref 1.7–7.7)
Neutrophils Relative %: 59 %
Platelets: 203 10*3/uL (ref 150–400)
RBC: 5.49 MIL/uL — ABNORMAL HIGH (ref 3.87–5.11)
RDW: 14.1 % (ref 11.5–15.5)
WBC: 5.1 10*3/uL (ref 4.0–10.5)
nRBC: 0 % (ref 0.0–0.2)

## 2021-05-21 LAB — COMPREHENSIVE METABOLIC PANEL
ALT: 22 U/L (ref 0–44)
AST: 25 U/L (ref 15–41)
Albumin: 3.7 g/dL (ref 3.5–5.0)
Alkaline Phosphatase: 52 U/L (ref 38–126)
Anion gap: 8 (ref 5–15)
BUN: 9 mg/dL (ref 8–23)
CO2: 24 mmol/L (ref 22–32)
Calcium: 9.2 mg/dL (ref 8.9–10.3)
Chloride: 107 mmol/L (ref 98–111)
Creatinine, Ser: 0.87 mg/dL (ref 0.44–1.00)
GFR, Estimated: >60 mL/min (ref >60)
Glucose, Bld: 94 mg/dL (ref 70–99)
Potassium: 3.6 mmol/L (ref 3.5–5.1)
Sodium: 139 mmol/L (ref 135–145)
Total Bilirubin: 0.8 mg/dL (ref 0.3–1.2)
Total Protein: 6.4 g/dL — ABNORMAL LOW (ref 6.5–8.1)

## 2021-05-21 LAB — MAGNESIUM: Magnesium: 1.7 mg/dL (ref 1.7–2.4)

## 2021-05-21 LAB — TSH: TSH: 0.578 u[IU]/mL (ref 0.350–4.500)

## 2021-05-21 LAB — TROPONIN I (HIGH SENSITIVITY): Troponin I (High Sensitivity): 12 ng/L (ref <18)

## 2021-05-21 LAB — PROTIME-INR
INR: 1 (ref 0.8–1.2)
Prothrombin Time: 12.7 seconds (ref 11.4–15.2)

## 2021-05-21 LAB — CBG MONITORING, ED: Glucose-Capillary: 86 mg/dL (ref 70–99)

## 2021-05-21 MED ORDER — SODIUM CHLORIDE 0.9 % IV BOLUS
1000.0000 mL | Freq: Once | INTRAVENOUS | Status: AC
  Administered 2021-05-21: 1000 mL via INTRAVENOUS

## 2021-05-21 NOTE — ED Triage Notes (Signed)
Pt from [facility] via [facility], further eval of sudden onset SHOB/palpitations 1320, dizziness. NIH 0 at [facility] but repetitive questioning, numbness/tingling in bilateral hands. EKG shows T-wave inversions, ST depression  20RAC, no meds given PTA  Hx hypokalemia, hypomagnesemia, SVT  114/74 HR 86 NSR

## 2021-05-21 NOTE — Discharge Instructions (Signed)
1.Your potassium was at 3.6 tonight.  This is the low side of normal.  Take a 40 mEq dose of your potassium when you get home this evening.  Take your dose of 40 mEq tomorrow morning.  Stay well-hydrated.  Take a 68mq equivalent dose late tomorrow night, then resume your usual daily 40 mEq.  See your doctor for recheck within the next 3 to 4 days and have a repeat on your potassium level.  Also, discussed outpatient heart monitoring. 2.  Return to emergency department immediately if you have a recurrence of any new concerning or worsening symptoms.

## 2021-05-21 NOTE — ED Notes (Signed)
Pt ambulated to the bathroom, denies dizziness.

## 2021-05-21 NOTE — ED Provider Notes (Addendum)
[place]    CSN: [id number] Arrival date & time: 05/21/21  1348      History   Chief Complaint Chief Complaint  Patient presents with   Dizziness    HPI [name] is a 60 y.o. female.   Patient here for evaluation of sudden onset dizziness and heart palpitations that began while driving at approximately 1:20 PM this afternoon.  Patient reports that she had to pull over.  Reports feeling like she was about to "pass out".  States chest palpitations have resolved and denies any chest pain but still having dizziness and blurred vision.  Patient also reports some numbness and tingling in her extremities but worse on the left side.  Denies any trauma, injury, or other precipitating event.  Denies any specific alleviating or aggravating factors.    The history is provided by the patient.  Dizziness Associated symptoms: palpitations and weakness    Past Medical History:  Diagnosis Date   Anemia    Anxiety    Colon polyp 08/28/2012   Tubular adenoma   Dense breast    Depression    Genital warts    GERD (gastroesophageal reflux disease)    Heart murmur    Hemorrhoids    Hx of cardiac catheterization 2009   normal coronary arteries   Hx of menorrhagia    Novasure   Hypokalemia    Hypomagnesemia    Hypophosphatemia    Lactose intolerance in adult    Meniscus degeneration, right    torn r knee/ 10/2019   Mitral valve prolapse    Normal cardiac stress test 03/09/2012   no ischemia   Positive PPD    Rapid heart beat    States "irreg heart beat"   Thyroid disease    Thyroid nodules    Patient Active Problem List   Diagnosis Date Noted   Paroxysmal SVT (supraventricular tachycardia) ([place]) 03/31/2021   Degenerative disc disease, cervical 10/13/2020   Neck pain 10/13/2020   Stress incontinence of urine 10/31/2018   History of diverticulosis 05/04/2017   History of PSVT (paroxysmal supraventricular tachycardia) 06/24/2012   Multinodular goiter  05/12/2012   SLEEP APNEA 01/19/2008   IRRITABLE BOWEL SYNDROME 01/18/2008    Past Surgical History:  Procedure Laterality Date   CARDIAC CATHETERIZATION     x2 with normal results per pt   [place] ENDOSCOPY  May 2013    OB History   No obstetric history on file.      Home Medications    Prior to Admission medications   Medication Sig Start Date End Date Taking? Authorizing Provider  aspirin EC 81 MG EC tablet Take 1 tablet (81 mg total) by mouth daily. 10/21/19  Yes Dixie Dials, MD  Calcium Carb-Cholecalciferol (OYSTER SHELL CALCIUM 250+D PO) Take 2 tablets by mouth at bedtime.   Yes [provider]  losartan (COZAAR) 50 MG tablet Take 25 mg by mouth 2 (two) times daily.   Yes [provider]  metoprolol succinate (TOPROL-XL) 25 MG 24 hr tablet Take 25 mg by mouth 2 (two) times daily.    Yes [provider]  PARoxetine (PAXIL-CR) 25 MG 24 hr tablet Take 25 mg by mouth daily.   Yes [provider]  potassium chloride SA (KLOR-CON M20) 20 MEQ tablet TAKE 2 TABLETS BY MOUTH EVERY DAY 04/16/21  Yes Sagardia, [place], MD  ALPRAZolam Duanne Moron) 1 MG tablet Take 1 tablet 1 hour before procedure. 11/10/20  Horald Pollen, MD  Ascorbic Acid (VITAMIN C) 1000 MG tablet Take 2,000 mg by mouth at bedtime.    [provider]  Cholecalciferol (VITAMIN D) 50 MCG (2000 UT) tablet Take 4,000 Units by mouth at bedtime.     [provider]  cyclobenzaprine (FLEXERIL) 5 MG tablet Take 1 tablet (5 mg total) by mouth at bedtime as needed for muscle spasms. Patient not taking: No sig reported 07/17/20   Just, Laurita Quint, FNP  fluticasone (FLONASE) 50 MCG/ACT nasal spray Place 1 spray into both nostrils at bedtime as needed for allergies or rhinitis.     [provider]  loratadine (CLARITIN) 10 MG tablet Take 10 mg by mouth daily as needed for allergies or rhinitis.     [provider]   meloxicam (MOBIC) 15 MG tablet Take 1 tablet (15 mg total) by mouth daily. 10/13/20   Horald Pollen, MD  Multiple Vitamins-Minerals (ZINC PO) Take 1 tablet by mouth at bedtime.    [provider]  OVER THE COUNTER MEDICATION Take 1 capsule by mouth daily. Soothe and Heal for joint pain  Patient not taking: No sig reported    [provider]  Polyethyl Glycol-Propyl Glycol (SYSTANE OP) Place 2 drops into both eyes daily.    [provider]  TURMERIC PO Take 1 capsule by mouth daily as needed (pain/inflammation).     [provider]    Family History Family History  Problem Relation Age of Onset   Heart disease Father        died Mi 17   Hypertension Maternal Grandfather    Diabetes Maternal Grandfather    Heart disease Maternal Grandfather    Colon cancer Maternal Grandfather    Rectal cancer Maternal Grandfather    Diabetes Maternal Grandmother    Prostate cancer Neg Hx    Breast cancer Neg Hx     Social History Social History   Tobacco Use   Smoking status: Never   Smokeless tobacco: Never  Vaping Use   Vaping Use: Never used  Substance Use Topics   Alcohol use: No   Drug use: No     Allergies   Ceftriaxone sodium and Doxycycline   Review of Systems Review of Systems  Eyes:  Positive for visual disturbance.  Cardiovascular:  Positive for palpitations.  Neurological:  Positive for dizziness, weakness, light-headedness and numbness.  All other systems reviewed and are negative.   Physical Exam Triage Vital Signs ED Triage Vitals [05/21/21 1405]  Enc Vitals Group     BP (!) 154/84     Pulse Rate (!) 104     Resp 18     Temp 98.2 F (36.8 C)     Temp src      SpO2 100 %     Weight      Height      Head Circumference      Peak Flow      Pain Score 0     Pain Loc      Pain Edu?      Excl. in [place]?    No data found.  Updated Vital Signs BP (!) 154/84   Pulse (!) 104   Temp 98.2 F (36.8 C)   Resp 18    LMP 09/18/2016 (Exact Date)   SpO2 100%   Visual Acuity Right Eye Distance:   Left Eye Distance:   Bilateral Distance:    Right Eye Near:   Left Eye Near:  Bilateral Near:     Physical Exam Vitals and nursing note reviewed.  Constitutional:      General: She is not in acute distress.    Appearance: Normal appearance. She is not ill-appearing, toxic-appearing or diaphoretic.  HENT:     Head: Normocephalic and atraumatic.  Eyes:     Conjunctiva/sclera: Conjunctivae normal.  Cardiovascular:     Rate and Rhythm: Normal rate.     Pulses: Normal pulses.  Pulmonary:     Effort: Pulmonary effort is normal.  Abdominal:     General: Abdomen is flat.  Musculoskeletal:        General: Normal range of motion.     Cervical back: Normal range of motion.  Skin:    General: Skin is warm and dry.  Neurological:     General: No focal deficit present.     Mental Status: She is alert and oriented to person, place, and time.     GCS: GCS eye subscore is 4. GCS verbal subscore is 5. GCS motor subscore is 6.     Cranial Nerves: No dysarthria (patient is repetitive in her speech) or facial asymmetry.     Sensory: No sensory deficit (reports decreased sensation on left side).     Motor: No weakness, seizure activity or pronator drift.     Coordination: Romberg sign negative.  Psychiatric:        Mood and Affect: Mood normal.     UC Treatments / Results  Labs (all labs ordered are listed, but only abnormal results are displayed) Labs Reviewed  CBG MONITORING, ED  CBG MONITORING, ED    EKG   Radiology No results found.  Procedures Procedures (including critical care time)  Medications Ordered in UC Medications - No data to display  Initial Impression / Assessment and Plan / UC Course  I have reviewed the triage vital signs and the nursing notes.  Pertinent labs & imaging results that were available during my care of the patient were reviewed by me and considered in my  medical decision making (see chart for details).    EKG with T wave abnormality that is different from previous EKG.  Sinus tachycardia with a normal rhythm.  Due to EKG changes, tingling and weakness to the left side, and ongoing dizziness and blurred vision patient needs to go to the emergency room for further evaluation of possible intracranial abnormality or ACS.  Patient transported to ED via [place].  Report called to [facility]. Final Clinical Impressions(s) / UC Diagnoses   Final diagnoses:  Dizziness  Near syncope  Heart palpitations   Discharge Instructions   None    ED Prescriptions   None    PDMP not reviewed this encounter.   Pearson Forster, NP 05/21/21 1512    Pearson Forster, NP 05/21/21 [phone]

## 2021-05-21 NOTE — ED Provider Notes (Signed)
[facility] EMERGENCY DEPARTMENT Provider Note   CSN: [id number] Arrival date & time: 05/21/21  1512     History No chief complaint on file.   [name] is a 60 y.o. female.  The history is provided by the patient and medical records. No language interpreter was used.  Palpitations Palpitations quality:  Fast Onset quality:  Sudden Duration:  30 seconds Timing:  Rare Progression:  Resolved Chronicity:  Recurrent Relieved by:  Nothing Worsened by:  Nothing Ineffective treatments:  None tried Associated symptoms: near-syncope and shortness of breath   Associated symptoms: no back pain, no chest pain, no chest pressure, no cough, no diaphoresis, no dizziness, no lower extremity edema, no malaise/fatigue, no nausea, no numbness (tinglin reported and resolved), no syncope, no vomiting and no weakness       Past Medical History:  Diagnosis Date   Anemia    Anxiety    Colon polyp 08/28/2012   Tubular adenoma   Dense breast    Depression    Genital warts    GERD (gastroesophageal reflux disease)    Heart murmur    Hemorrhoids    Hx of cardiac catheterization 2009   normal coronary arteries   Hx of menorrhagia    Novasure   Hypokalemia    Hypomagnesemia    Hypophosphatemia    Lactose intolerance in adult    Meniscus degeneration, right    torn r knee/ 10/2019   Mitral valve prolapse    Normal cardiac stress test 03/09/2012   no ischemia   Positive PPD    Rapid heart beat    States "irreg heart beat"   Thyroid disease    Thyroid nodules    Patient Active Problem List   Diagnosis Date Noted   Paroxysmal SVT (supraventricular tachycardia) ([place]) 03/31/2021   Degenerative disc disease, cervical 10/13/2020   Neck pain 10/13/2020   Stress incontinence of urine 10/31/2018   History of diverticulosis 05/04/2017   History of PSVT (paroxysmal supraventricular tachycardia) 06/24/2012   Multinodular goiter 05/12/2012   SLEEP APNEA  01/19/2008   IRRITABLE BOWEL SYNDROME 01/18/2008    Past Surgical History:  Procedure Laterality Date   CARDIAC CATHETERIZATION     x2 with normal results per pt   NOVASURE ABLATION     UPPER GASTROINTESTINAL ENDOSCOPY  May 2013     OB History   No obstetric history on file.     Family History  Problem Relation Age of Onset   Heart disease Father        died Mi 72   Hypertension Maternal Grandfather    Diabetes Maternal Grandfather    Heart disease Maternal Grandfather    Colon cancer Maternal Grandfather    Rectal cancer Maternal Grandfather    Diabetes Maternal Grandmother    Prostate cancer Neg Hx    Breast cancer Neg Hx     Social History   Tobacco Use   Smoking status: Never   Smokeless tobacco: Never  Vaping Use   Vaping Use: Never used  Substance Use Topics   Alcohol use: No   Drug use: No    Home Medications Prior to Admission medications   Medication Sig Start Date End Date Taking? Authorizing Provider  ALPRAZolam Duanne Moron) 1 MG tablet Take 1 tablet 1 hour before procedure. 11/10/20   Horald Pollen, MD  Ascorbic Acid (VITAMIN C) 1000 MG tablet Take 2,000 mg by mouth at bedtime.    [provider]  aspirin EC  81 MG EC tablet Take 1 tablet (81 mg total) by mouth daily. 10/21/19   Dixie Dials, MD  Calcium Carb-Cholecalciferol (OYSTER SHELL CALCIUM 250+D PO) Take 2 tablets by mouth at bedtime.    [provider]  Cholecalciferol (VITAMIN D) 50 MCG (2000 UT) tablet Take 4,000 Units by mouth at bedtime.     [provider]  cyclobenzaprine (FLEXERIL) 5 MG tablet Take 1 tablet (5 mg total) by mouth at bedtime as needed for muscle spasms. Patient not taking: No sig reported 07/17/20   Just, Laurita Quint, FNP  fluticasone (FLONASE) 50 MCG/ACT nasal spray Place 1 spray into both nostrils at bedtime as needed for allergies or rhinitis.     [provider]  loratadine (CLARITIN) 10 MG tablet Take 10 mg by mouth daily as needed for  allergies or rhinitis.     [provider]  losartan (COZAAR) 50 MG tablet Take 25 mg by mouth 2 (two) times daily.    [provider]  meloxicam (MOBIC) 15 MG tablet Take 1 tablet (15 mg total) by mouth daily. 10/13/20   Horald Pollen, MD  metoprolol succinate (TOPROL-XL) 25 MG 24 hr tablet Take 25 mg by mouth 2 (two) times daily.     [provider]  Multiple Vitamins-Minerals (ZINC PO) Take 1 tablet by mouth at bedtime.    [provider]  OVER THE COUNTER MEDICATION Take 1 capsule by mouth daily. Soothe and Heal for joint pain  Patient not taking: No sig reported    [provider]  PARoxetine (PAXIL-CR) 25 MG 24 hr tablet Take 25 mg by mouth daily.    [provider]  Polyethyl Glycol-Propyl Glycol (SYSTANE OP) Place 2 drops into both eyes daily.    [provider]  potassium chloride SA (KLOR-CON M20) 20 MEQ tablet TAKE 2 TABLETS BY MOUTH EVERY DAY 04/16/21   Horald Pollen, MD  TURMERIC PO Take 1 capsule by mouth daily as needed (pain/inflammation).     [provider]    Allergies    Ceftriaxone sodium and Doxycycline  Review of Systems   Review of Systems  Constitutional:  Negative for chills, diaphoresis, fatigue, fever and malaise/fatigue.  HENT:  Negative for congestion.   Eyes:  Positive for visual disturbance (blurry during episode that resolved).  Respiratory:  Positive for shortness of breath. Negative for cough, chest tightness and wheezing.   Cardiovascular:  Positive for palpitations and near-syncope. Negative for chest pain and syncope.  Gastrointestinal:  Negative for abdominal pain, constipation, diarrhea, nausea and vomiting.  Genitourinary:  Negative for dysuria, flank pain and frequency.  Musculoskeletal:  Negative for back pain, neck pain and neck stiffness.  Skin:  Negative for rash and wound.  Neurological:  Positive for light-headedness. Negative for dizziness, syncope,  weakness, numbness (tinglin reported and resolved) and headaches.  Psychiatric/Behavioral:  Negative for agitation and confusion.   All other systems reviewed and are negative.  Physical Exam Updated Vital Signs BP 104/68   Pulse 68   Temp 98.1 F (36.7 C) (Oral)   Resp 18   LMP 09/18/2016 (Exact Date)   SpO2 100%   Physical Exam Vitals and nursing note reviewed.  Constitutional:      General: She is not in acute distress.    Appearance: She is well-developed. She is not ill-appearing, toxic-appearing or diaphoretic.  HENT:     Head: Normocephalic and atraumatic.     Mouth/Throat:     Mouth: Mucous membranes are  dry.     Pharynx: No oropharyngeal exudate or posterior oropharyngeal erythema.  Eyes:     Extraocular Movements: Extraocular movements intact.     Conjunctiva/sclera: Conjunctivae normal.     Pupils: Pupils are equal, round, and reactive to light.  Cardiovascular:     Rate and Rhythm: Normal rate and regular rhythm.     Pulses: Normal pulses.     Heart sounds: Murmur heard.  Pulmonary:     Effort: Pulmonary effort is normal. No respiratory distress.     Breath sounds: Normal breath sounds. No wheezing, rhonchi or rales.  Chest:     Chest wall: No tenderness.  Abdominal:     General: Abdomen is flat.     Palpations: Abdomen is soft.     Tenderness: There is no abdominal tenderness. There is no right CVA tenderness, guarding or rebound.  Musculoskeletal:        General: No tenderness.     Cervical back: Neck supple. No tenderness.     Right lower leg: No edema.     Left lower leg: No edema.  Skin:    General: Skin is warm and dry.     Findings: No erythema.  Neurological:     General: No focal deficit present.     Mental Status: She is alert and oriented to person, place, and time. Mental status is at baseline.     Cranial Nerves: No cranial nerve deficit.     Sensory: No sensory deficit.     Motor: No weakness.     Coordination: Coordination normal.   Psychiatric:        Mood and Affect: Mood normal.    ED Results / Procedures / Treatments   Labs (all labs ordered are listed, but only abnormal results are displayed) Labs Reviewed  CBC WITH DIFFERENTIAL/PLATELET - Abnormal; Notable for the following components:      Result Value   RBC 5.49 (*)    MCV 75.0 (*)    MCH 23.9 (*)    All other components within normal limits  PROTIME-INR  MAGNESIUM  TSH  COMPREHENSIVE METABOLIC PANEL    EKG EKG Interpretation  Date/Time:  Thursday May 21 2021 15:29:06 EDT Ventricular Rate:  70 PR Interval:  176 QRS Duration: 74 QT Interval:  396 QTC Calculation: 428 R Axis:   32 Text Interpretation: Sinus rhythm Consider left atrial enlargement Anteroseptal infarct, age indeterminate When compared to prior, similar appearance to prior. No STEMI Confirmed by Antony Blackbird [phone]) on 05/21/2021 3:31:30 PM  Radiology DG Chest Portable 1 View  Result Date: 05/21/2021 CLINICAL DATA:  near syncope, shortness of breath, palpitations EXAM: PORTABLE CHEST 1 VIEW COMPARISON:  None. FINDINGS: The heart size and mediastinal contours are within normal limits. Both lungs are clear. No visible pleural effusions or pneumothorax. No acute osseous abnormality. IMPRESSION: No evidence of acute cardiopulmonary disease. Electronically Signed   By: Margaretha Sheffield M.D.   On: 05/21/2021 16:26    Procedures Procedures   Medications Ordered in ED Medications  sodium chloride 0.9 % bolus 1,000 mL (1,000 mLs Intravenous New Bag/Given 05/21/21 1601)    ED Course  I have reviewed the triage vital signs and the nursing notes.  Pertinent labs & imaging results that were available during my care of the patient were reviewed by me and considered in my medical decision making (see chart for details).    MDM Rules/Calculators/A&P  [name] is a 60 y.o. female with a past medical history significant for paroxysmal SVT,  thyroid disease, GERD, previous cardiac catheterization, anxiety, mitral valve prolapse with subtle chronic murmur, and diverticulosis who presents with sudden onset palpitations, near syncope, diffuse body tingling, and blurry vision.  Patient reports that this afternoon, she was driving the car when she had sudden onset of severe palpitations.  She reports she was very short of breath and felt like she was going to pass out.  She pulled over but did not lose consciousness.  She says that it lasted about 30 seconds to 1 minute before improving.  She reports she had numbness and tingling in both of her hands as well as her feet but worse in the hands.  She denies any neck pain or headache.  Denies any speech difficulties but does report some blurry vision when it was ongoing.  That has since resolved.  She denies chest pain during this.  She went to urgent care and EKG showed some T wave inversions and given the reported vision changes and tingling symptoms she was sent for evaluation to rule out a cardiac or neurologic cause of her symptoms today.  Patient denies any recent medication changes and did not report any drug use.  She reports that this morning she was feeling completely normal and had no complaints   Before this began.  She is physical denies fevers, chills, congestion, cough, nausea, vomiting, constipation, diarrhea, or urinary changes.  She does a history of electrolyte abnormalities reported.  On exam, lungs are clear and chest is nontender.  She does have a slight systolic murmur but otherwise back and abdomen nontender.  Good pulses in extremities.  No focal neurologic deficits with normal sensation and strength in extremities, normal finger-nose-finger testing bilaterally, symmetric smile, clear speech, Peoples are symmetric and reactive normal extraocular movements.  No carotid bruit appreciated.  Patient otherwise well and has reassuring vital signs.  Clinical aspect she had a  symptomatic episode of SVT which self resolved causing all of her symptoms.  The EKG does not show STEMI and appears similar to EKG earlier this year.  Do not see evidence of SVT at this time.  Given this concerning episode, we will get screening labs and electrolytes and will also get work-up to look for other cause of the sudden onset shortness of breath and near syncope.  Given her lack of tachycardia, chest pain, or any leg symptoms, low suspicion for a thromboembolic etiology of symptoms.  With the vision changes and tingling reported, and that she was told we needed to rule out a neurologic cause of symptoms, we will get a head CT to look for acute abnormality but I suspect this was all lightheadedness due to the likely SVT episode.  If work-up is complete reassuring, anticipate discharge to follow-up with PCP and cardiology.  Care transferred to oncoming team while awaiting results of work-up.   Final Clinical Impression(s) / ED Diagnoses Final diagnoses:  Near syncope  Palpitations  Transient vision disturbance  Tingling    Clinical Impression: 1. Near syncope   2. Palpitations   3. Transient vision disturbance   4. Tingling     Disposition: Care transferred to oncoming team while awaiting results of work-up.  This note was prepared with assistance of Systems analyst. Occasional wrong-word or sound-a-like substitutions may have occurred due to the inherent limitations of voice recognition software.     Angelette Ganus, Gwenyth Allegra, MD 05/21/21 [phone]

## 2021-05-21 NOTE — ED Triage Notes (Signed)
PT was driving today  when she became suddenly short of breath, she "beat on her chest" and pulled over due to dizziness. Describes dizziness as lightheaded sensation.   Denies chest pain.

## 2021-05-21 NOTE — ED Notes (Signed)
Pt care taken, pt has no complaints at this time

## 2021-06-16 DIAGNOSIS — F4310 Post-traumatic stress disorder, unspecified: Secondary | ICD-10-CM | POA: Diagnosis not present

## 2021-06-16 DIAGNOSIS — F411 Generalized anxiety disorder: Secondary | ICD-10-CM | POA: Diagnosis not present

## 2021-06-16 DIAGNOSIS — R69 Illness, unspecified: Secondary | ICD-10-CM | POA: Diagnosis not present

## 2021-07-25 ENCOUNTER — Encounter (HOSPITAL_COMMUNITY): Payer: Self-pay

## 2021-07-25 ENCOUNTER — Emergency Department (HOSPITAL_COMMUNITY): Payer: 59

## 2021-07-25 ENCOUNTER — Emergency Department (HOSPITAL_COMMUNITY)
Admission: EM | Admit: 2021-07-25 | Discharge: 2021-07-25 | Disposition: A | Discharge status: Home / Self Care | Payer: 59 | Attending: Emergency Medicine | Admitting: Emergency Medicine

## 2021-07-25 ENCOUNTER — Other Ambulatory Visit: Payer: Self-pay

## 2021-07-25 DIAGNOSIS — R509 Fever, unspecified: Secondary | ICD-10-CM | POA: Diagnosis not present

## 2021-07-25 DIAGNOSIS — R002 Palpitations: Secondary | ICD-10-CM | POA: Diagnosis not present

## 2021-07-25 DIAGNOSIS — R531 Weakness: Secondary | ICD-10-CM | POA: Diagnosis not present

## 2021-07-25 DIAGNOSIS — Z5321 Procedure and treatment not carried out due to patient leaving prior to being seen by health care provider: Secondary | ICD-10-CM | POA: Diagnosis not present

## 2021-07-25 DIAGNOSIS — R5383 Other fatigue: Secondary | ICD-10-CM | POA: Insufficient documentation

## 2021-07-25 DIAGNOSIS — R0602 Shortness of breath: Secondary | ICD-10-CM | POA: Insufficient documentation

## 2021-07-25 LAB — CBC WITH DIFFERENTIAL/PLATELET
Abs Immature Granulocytes: 0.01 10*3/uL (ref 0.00–0.07)
Basophils Absolute: 0 10*3/uL (ref 0.0–0.1)
Basophils Relative: 0 %
Eosinophils Absolute: 0.1 10*3/uL (ref 0.0–0.5)
Eosinophils Relative: 2 %
HCT: 41.5 % (ref 36.0–46.0)
Hemoglobin: 13.2 g/dL (ref 12.0–15.0)
Immature Granulocytes: 0 %
Lymphocytes Relative: 23 %
Lymphs Abs: 1.5 10*3/uL (ref 0.7–4.0)
MCH: 24 pg — ABNORMAL LOW (ref 26.0–34.0)
MCHC: 31.8 g/dL (ref 30.0–36.0)
MCV: 75.3 fL — ABNORMAL LOW (ref 80.0–100.0)
Monocytes Absolute: 0.8 10*3/uL (ref 0.1–1.0)
Monocytes Relative: 12 %
Neutro Abs: 4.1 10*3/uL (ref 1.7–7.7)
Neutrophils Relative %: 63 %
Platelets: 227 10*3/uL (ref 150–400)
RBC: 5.51 MIL/uL — ABNORMAL HIGH (ref 3.87–5.11)
RDW: 15.4 % (ref 11.5–15.5)
WBC: 6.5 10*3/uL (ref 4.0–10.5)
nRBC: 0 % (ref 0.0–0.2)

## 2021-07-25 LAB — COMPREHENSIVE METABOLIC PANEL
ALT: 24 U/L (ref 0–44)
AST: 28 U/L (ref 15–41)
Albumin: 4.3 g/dL (ref 3.5–5.0)
Alkaline Phosphatase: 62 U/L (ref 38–126)
Anion gap: 5 (ref 5–15)
BUN: 12 mg/dL (ref 8–23)
CO2: 26 mmol/L (ref 22–32)
Calcium: 9.3 mg/dL (ref 8.9–10.3)
Chloride: 109 mmol/L (ref 98–111)
Creatinine, Ser: 0.7 mg/dL (ref 0.44–1.00)
GFR, Estimated: >60 mL/min (ref >60)
Glucose, Bld: 114 mg/dL — ABNORMAL HIGH (ref 70–99)
Potassium: 3.6 mmol/L (ref 3.5–5.1)
Sodium: 140 mmol/L (ref 135–145)
Total Bilirubin: 0.6 mg/dL (ref 0.3–1.2)
Total Protein: 7.2 g/dL (ref 6.5–8.1)

## 2021-07-25 LAB — URINALYSIS, ROUTINE W REFLEX MICROSCOPIC
Bilirubin Urine: NEGATIVE
Glucose, UA: NEGATIVE mg/dL
Hgb urine dipstick: NEGATIVE
Ketones, ur: NEGATIVE mg/dL
Nitrite: NEGATIVE
Protein, ur: NEGATIVE mg/dL
Specific Gravity, Urine: 1.016 (ref 1.005–1.030)
WBC, UA: >50 WBC/hpf — ABNORMAL HIGH (ref 0–5)
pH: 5 (ref 5.0–8.0)

## 2021-07-25 LAB — BRAIN NATRIURETIC PEPTIDE: B Natriuretic Peptide: 12.5 pg/mL (ref 0.0–100.0)

## 2021-07-25 NOTE — ED Triage Notes (Signed)
Pt states that she has been having weakness, fatigue, and irregular heart beats all day.

## 2021-07-25 NOTE — ED Provider Notes (Signed)
Emergency Medicine Provider Triage Evaluation Note  [name] , a 60 y.o. female  was evaluated in triage.  Pt complains of irregular heart beat. She states that over the past week she has been feeling weaker and more fatigued. She also endorses that she has been experiencing irregular heart beats all day today which prompted her ER visit today. She endorses a history of same, for which she has been placed on metoprolol by cardiology. She also endorses some subjective fevers and shortness of breath. She denies chest pain. She denies recent travel, surgeries, or exogenous estrogen use. No history of blood clots.  Review of Systems  Positive: Weakness, fatigue, irregular heart beat Negative: Nausea, vomiting, diarrhea  Physical Exam  BP 125/82 (BP Location: Left Arm)   Pulse 85   Temp 98.2 F (36.8 C) (Oral)   Resp 17   LMP 09/18/2016 (Exact Date)   SpO2 99%  Gen:   Awake, no distress   Resp:  Normal effort  MSK:   Moves extremities without difficulty  Other:    Medical Decision Making  Medically screening exam initiated at 7:59 PM.  Appropriate orders placed.  [name] was informed that the remainder of the evaluation will be completed by another provider, this initial triage assessment does not replace that evaluation, and the importance of remaining in the ED until their evaluation is complete.    Nestor Lewandowsky 07/25/21 2003    Tegeler, Gwenyth Allegra, MD 07/26/21 [phone]

## 2021-07-25 NOTE — ED Notes (Signed)
Pt told registration that she would be leaving without being seen after triage.

## 2021-07-27 ENCOUNTER — Telehealth: Payer: Self-pay | Admitting: Emergency Medicine

## 2021-07-27 NOTE — Telephone Encounter (Signed)
Patient was seen in ED 11/12 & had a urine culture  Patient would like Dr. Mitchel Honour to review over lab results & let her know if she needs to be taking anything bc she was told her wbc count was high   Please call [phone]

## 2021-07-29 NOTE — Telephone Encounter (Signed)
Patient states she did not receive antibiotics in ed  Patient states she is currently on amoxicillin due to a tooth extraction   Patient is requesting a call back to discuss further details

## 2021-07-29 NOTE — Telephone Encounter (Signed)
Blood work looks okay but urinalysis shows UTI.  She should be on an antibiotic.  Call and ask if she was given antibiotic in the emergency department.  If not we need to call one in.  Thanks.

## 2021-07-29 NOTE — Telephone Encounter (Signed)
Called and left vm to return call

## 2021-07-30 ENCOUNTER — Other Ambulatory Visit: Payer: Self-pay | Admitting: Emergency Medicine

## 2021-07-30 DIAGNOSIS — N390 Urinary tract infection, site not specified: Secondary | ICD-10-CM

## 2021-07-30 MED ORDER — SULFAMETHOXAZOLE-TRIMETHOPRIM 800-160 MG PO TABS: 1.0000 | ORAL_TABLET | Freq: Two times a day (BID) | ORAL | 0 refills | Status: AC

## 2021-07-30 NOTE — Telephone Encounter (Signed)
Stop the amoxicillin and start Bactrim DS twice a day for 7 days.  Prescription sent to pharmacy of record. If she wants to further discuss her case, she needs to make an appointment to see me in the office.

## 2021-07-30 NOTE — Telephone Encounter (Signed)
Patient called back, was able to relay information. Pt verb understanding.

## 2021-07-30 NOTE — Telephone Encounter (Signed)
Called pt several times to relay information about antibiotics, no answer and VM is full. Will try again.

## 2021-08-03 NOTE — Telephone Encounter (Signed)
Patient called in   Requesting call back from nurse  Please call patient [phone]

## 2021-08-03 NOTE — Telephone Encounter (Signed)
Called pt- no answer, unable to leave vm.  

## 2021-08-04 NOTE — Telephone Encounter (Signed)
Called and left a VM with Dr. Barry Brunner recommendations.

## 2021-08-04 NOTE — Telephone Encounter (Signed)
Her last potassium level was normal.  Continue Bactrim and stop potassium supplement for as long as she is taking the antibiotic.  Restart potassium supplement when finished with the antibiotic.  Thanks.

## 2021-08-04 NOTE — Telephone Encounter (Signed)
Patient called and stated that the pharmacy advised her that the Bactrim antibiotic holds potassium in kidneys and patient is taking potassium medication. Patient concern is that she is taking too much potassium. Pt states is it safe for her to continue take the Bactrim antibiotic?

## 2021-08-14 DIAGNOSIS — N950 Postmenopausal bleeding: Secondary | ICD-10-CM | POA: Diagnosis not present

## 2021-08-16 NOTE — Progress Notes (Deleted)
Subjective:    Patient ID: [name], female    DOB: June 20, 1961, 60 y.o.   MRN: [id number]  This visit occurred during the SARS-CoV-2 public health emergency.  Safety protocols were in place, including screening questions prior to the visit, additional usage of staff PPE, and extensive cleaning of exam room while observing appropriate contact time as indicated for disinfecting solutions.    HPI She is here for an acute visit for cold symptoms.   Her symptoms started   She is experiencing   She has tried taking       Medications and allergies reviewed with patient and updated if appropriate.  Patient Active Problem List   Diagnosis Date Noted   Paroxysmal SVT (supraventricular tachycardia) (HCC) 03/31/2021   Degenerative disc disease, cervical 10/13/2020   Neck pain 10/13/2020   Stress incontinence of urine 10/31/2018   History of diverticulosis 05/04/2017   History of PSVT (paroxysmal supraventricular tachycardia) 06/24/2012   Multinodular goiter 05/12/2012   SLEEP APNEA 01/19/2008   IRRITABLE BOWEL SYNDROME 01/18/2008    Current Outpatient Medications on File Prior to Visit  Medication Sig Dispense Refill   ALPRAZolam (XANAX) 1 MG tablet Take 1 tablet 1 hour before procedure. 10 tablet 0   Ascorbic Acid (VITAMIN C) 1000 MG tablet Take 2,000 mg by mouth at bedtime.     aspirin EC 81 MG EC tablet Take 1 tablet (81 mg total) by mouth daily.     Calcium Carb-Cholecalciferol (OYSTER SHELL CALCIUM 250+D PO) Take 2 tablets by mouth at bedtime.     Cholecalciferol (VITAMIN D) 50 MCG (2000 UT) tablet Take 4,000 Units by mouth at bedtime.      cyclobenzaprine (FLEXERIL) 5 MG tablet Take 1 tablet (5 mg total) by mouth at bedtime as needed for muscle spasms. (Patient not taking: No sig reported) 10 tablet 1   fluticasone (FLONASE) 50 MCG/ACT nasal spray Place 1 spray into both nostrils at bedtime as needed for allergies or rhinitis.      loratadine (CLARITIN) 10 MG  tablet Take 10 mg by mouth daily as needed for allergies or rhinitis.      losartan (COZAAR) 50 MG tablet Take 25 mg by mouth 2 (two) times daily.     meloxicam (MOBIC) 15 MG tablet Take 1 tablet (15 mg total) by mouth daily. 30 tablet 0   metoprolol succinate (TOPROL-XL) 25 MG 24 hr tablet Take 25 mg by mouth 2 (two) times daily.      Multiple Vitamins-Minerals (ZINC PO) Take 1 tablet by mouth at bedtime.     OVER THE COUNTER MEDICATION Take 1 capsule by mouth daily. Soothe and Heal for joint pain  (Patient not taking: No sig reported)     PARoxetine (PAXIL-CR) 25 MG 24 hr tablet Take 25 mg by mouth daily.     Polyethyl Glycol-Propyl Glycol (SYSTANE OP) Place 2 drops into both eyes daily.     potassium chloride SA (KLOR-CON M20) 20 MEQ tablet TAKE 2 TABLETS BY MOUTH EVERY DAY 60 tablet 3   TURMERIC PO Take 1 capsule by mouth daily as needed (pain/inflammation).      No current facility-administered medications on file prior to visit.    Past Medical History:  Diagnosis Date   Anemia    Anxiety    Colon polyp 08/28/2012   Tubular adenoma   Dense breast    Depression    Genital warts    GERD (gastroesophageal reflux disease)    Heart murmur  Hemorrhoids    Hx of cardiac catheterization 2009   normal coronary arteries   Hx of menorrhagia    Novasure   Hypokalemia    Hypomagnesemia    Hypophosphatemia    Lactose intolerance in adult    Meniscus degeneration, right    torn r knee/ 10/2019   Mitral valve prolapse    Normal cardiac stress test 03/09/2012   no ischemia   Positive PPD    Rapid heart beat    States "irreg heart beat"   Thyroid disease    Thyroid nodules    Past Surgical History:  Procedure Laterality Date   CARDIAC CATHETERIZATION     x2 with normal results per pt   NOVASURE ABLATION     UPPER GASTROINTESTINAL ENDOSCOPY  May 2013    Social History   Socioeconomic History   Marital status: Divorced    Spouse name: Not on file   Number of children: 2    Years of education: 16   Highest education level: Not on file  Occupational History   Occupation: dialysis Engineer, production: [employer]  Tobacco Use   Smoking status: Never   Smokeless tobacco: Never  Vaping Use   Vaping Use: Never used  Substance and Sexual Activity   Alcohol use: No   Drug use: No   Sexual activity: Yes  Other Topics Concern   Not on file  Social History Narrative   Ms. 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 She lived in [place] and in [place] for a number of years her mom is from [place]. 78 years of education went to college at [employer] lives at home with her son who is in his 58s no pets   Neg ets tob etoh hx PA   6 hours of sleep   G2P2    TD2010  colonoscopy 2009   Now running  own business    HH of 2    Left handed    Caffeine use: tea sometimes         Social Determinants of Health   Financial Resource Strain: Not on file  Food Insecurity: Not on file  Transportation Needs: Not on file  Physical Activity: Not on file  Stress: Not on file  Social Connections: Not on file    Family History  Problem Relation Age of Onset   Heart disease Father        died Mi 75   Hypertension Maternal Grandfather    Diabetes Maternal Grandfather    Heart disease Maternal Grandfather    Colon cancer Maternal Grandfather    Rectal cancer Maternal Grandfather    Diabetes Maternal Grandmother    Prostate cancer Neg Hx    Breast cancer Neg Hx     Review of Systems     Objective:  There were no vitals filed for this visit. BP Readings from Last 3 Encounters:  07/25/21 125/82  05/21/21 109/71  05/21/21 (!) 154/84   Wt Readings from Last 3 Encounters:  03/31/21 204 lb (92.5 kg)  11/10/20 202 lb (91.6 kg)  10/13/20 199 lb (90.3 kg)   There is no height or weight on file to calculate BMI.   Physical Exam     GENERAL APPEARANCE: Appears stated age, well appearing, NAD EYES: conjunctiva clear, no icterus HENT: bilateral tympanic membranes and ear canals normal, oropharynx with  no erythema or exudates, trachea midline, no cervical or supraclavicular lymphadenopathy LUNGS: Unlabored breathing, good air entry bilaterally, clear to auscultation without wheeze or crackles CARDIOVASCULAR: Normal S1,S2 , no edema SKIN: Warm, dry      Assessment & Plan:    See Problem List for Assessment and Plan of chronic medical problems.

## 2021-08-17 ENCOUNTER — Ambulatory Visit: Payer: PRIVATE HEALTH INSURANCE | Admitting: Internal Medicine

## 2021-08-23 ENCOUNTER — Other Ambulatory Visit: Payer: Self-pay | Admitting: Emergency Medicine

## 2021-08-23 DIAGNOSIS — E876 Hypokalemia: Secondary | ICD-10-CM

## 2021-08-24 DIAGNOSIS — F411 Generalized anxiety disorder: Secondary | ICD-10-CM | POA: Diagnosis not present

## 2021-08-24 DIAGNOSIS — F4310 Post-traumatic stress disorder, unspecified: Secondary | ICD-10-CM | POA: Diagnosis not present

## 2021-08-24 DIAGNOSIS — R69 Illness, unspecified: Secondary | ICD-10-CM | POA: Diagnosis not present

## 2021-08-25 ENCOUNTER — Ambulatory Visit (INDEPENDENT_AMBULATORY_CARE_PROVIDER_SITE_OTHER): Payer: PRIVATE HEALTH INSURANCE | Admitting: Emergency Medicine

## 2021-08-25 ENCOUNTER — Encounter: Payer: Self-pay | Admitting: Emergency Medicine

## 2021-08-25 ENCOUNTER — Other Ambulatory Visit: Payer: Self-pay

## 2021-08-25 VITALS — BP 102/60 | HR 78 | Temp 97.9°F | Ht 66.0 in | Wt 207.0 lb

## 2021-08-25 DIAGNOSIS — R7303 Prediabetes: Secondary | ICD-10-CM

## 2021-08-25 DIAGNOSIS — J329 Chronic sinusitis, unspecified: Secondary | ICD-10-CM | POA: Diagnosis not present

## 2021-08-25 NOTE — Patient Instructions (Signed)

## 2021-08-25 NOTE — Assessment & Plan Note (Signed)
No signs of active infection.  No need for antibiotics. Recommended to use saline nasal sprays and Flonase as needed.

## 2021-08-25 NOTE — Progress Notes (Signed)
[name] 60 y.o.   Chief Complaint  Patient presents with   Office Visit    Sinus problems, headache    HISTORY OF PRESENT ILLNESS: This is a 60 y.o. female complaining of chronic sinus problems with occasional sinus headaches. Recently had dental work that required taking antibiotics for 14 days. Feeling better.  No other complaints or medical concerns today.  HPI   Prior to Admission medications   Medication Sig Start Date End Date Taking? Authorizing Provider  Ascorbic Acid (VITAMIN C) 1000 MG tablet Take 2,000 mg by mouth at bedtime.   Yes [provider]  Calcium Carb-Cholecalciferol (OYSTER SHELL CALCIUM 250+D PO) Take 2 tablets by mouth at bedtime.   Yes [provider]  Cholecalciferol (VITAMIN D) 50 MCG (2000 UT) tablet Take 4,000 Units by mouth at bedtime.    Yes [provider]  losartan (COZAAR) 50 MG tablet Take 25 mg by mouth 2 (two) times daily.   Yes [provider]  metoprolol succinate (TOPROL-XL) 25 MG 24 hr tablet Take 25 mg by mouth 2 (two) times daily.    Yes [provider]  Multiple Vitamins-Minerals (ZINC PO) Take 1 tablet by mouth at bedtime.   Yes [provider]  PARoxetine (PAXIL-CR) 25 MG 24 hr tablet Take 25 mg by mouth daily.   Yes [provider]  Polyethyl Glycol-Propyl Glycol (SYSTANE OP) Place 2 drops into both eyes daily.   Yes [provider]  potassium chloride SA (KLOR-CON M20) 20 MEQ tablet TAKE 2 TABLETS BY MOUTH EVERY DAY 08/24/21  Yes Shasha Buchbinder, Ines Bloomer, MD  ALPRAZolam Duanne Moron) 1 MG tablet Take 1 tablet 1 hour before procedure. Patient not taking: Reported on 08/25/2021 11/10/20   Horald Pollen, MD  aspirin EC 81 MG EC tablet Take 1 tablet (81 mg total) by mouth daily. Patient not taking: Reported on 08/25/2021 10/21/19   Dixie Dials, MD  cyclobenzaprine (FLEXERIL) 5 MG tablet Take 1 tablet (5 mg total) by mouth at bedtime as needed for muscle  spasms. Patient not taking: Reported on 11/10/2020 07/17/20   Just, Laurita Quint, FNP  fluticasone (FLONASE) 50 MCG/ACT nasal spray Place 1 spray into both nostrils at bedtime as needed for allergies or rhinitis.  Patient not taking: Reported on 08/25/2021    [provider]  loratadine (CLARITIN) 10 MG tablet Take 10 mg by mouth daily as needed for allergies or rhinitis.  Patient not taking: Reported on 08/25/2021    [provider]  meloxicam (MOBIC) 15 MG tablet Take 1 tablet (15 mg total) by mouth daily. Patient not taking: Reported on 08/25/2021 10/13/20   Horald Pollen, MD  OVER THE COUNTER MEDICATION Take 1 capsule by mouth daily. Soothe and Heal for joint pain  Patient not taking: Reported on 11/10/2020    [provider]  TURMERIC PO Take 1 capsule by mouth daily as needed (pain/inflammation).  Patient not taking: Reported on 08/25/2021    [provider]    Allergies  Allergen Reactions   Ceftriaxone Sodium Itching   Doxycycline Rash    Patient Active Problem List   Diagnosis Date Noted   Paroxysmal SVT (supraventricular tachycardia) ([facility]) 03/31/2021   Degenerative disc disease, cervical 10/13/2020   Neck pain 10/13/2020   Stress incontinence of urine 10/31/2018   History of diverticulosis 05/04/2017   History of PSVT (paroxysmal supraventricular tachycardia) 06/24/2012   Multinodular goiter 05/12/2012   SLEEP APNEA 01/19/2008   IRRITABLE BOWEL SYNDROME 01/18/2008  Past Medical History:  Diagnosis Date   Anemia    Anxiety    Colon polyp 08/28/2012   Tubular adenoma   Dense breast    Depression    Genital warts    GERD (gastroesophageal reflux disease)    Heart murmur    Hemorrhoids    Hx of cardiac catheterization 2009   normal coronary arteries   Hx of menorrhagia    Novasure   Hypokalemia    Hypomagnesemia    Hypophosphatemia    Lactose intolerance in adult    Meniscus degeneration, right    torn r knee/ 10/2019    Mitral valve prolapse    Normal cardiac stress test 03/09/2012   no ischemia   Positive PPD    Rapid heart beat    States "irreg heart beat"   Thyroid disease    Thyroid nodules    Past Surgical History:  Procedure Laterality Date   CARDIAC CATHETERIZATION     x2 with normal results per pt   NOVASURE ABLATION     UPPER GASTROINTESTINAL ENDOSCOPY  May 2013    Social History   Socioeconomic History   Marital status: Divorced    Spouse name: Not on file   Number of children: 2   Years of education: 16   Highest education level: Not on file  Occupational History   Occupation: dialysis Engineer, production: [employer]  Tobacco Use   Smoking status: Never   Smokeless tobacco: Never  Vaping Use   Vaping Use: Never used  Substance and Sexual Activity   Alcohol use: No   Drug use: No   Sexual activity: Yes  Other Topics Concern   Not on file  Social History Narrative   [name] is a divorced [place] American female who works as a Engineer, 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 She lived in [place] and in [place] for a number of years her mom is from [place]. 54 years of education went to college at [employer] lives at home with her son who is in his 42s no pets   Neg ets tob etoh hx PA   6 hours of sleep   G2P2    TD2010  colonoscopy 2009   Now running  own business    HH of 2    Left handed    Caffeine use: tea sometimes         Social Determinants of Health   Financial Resource Strain: Not on file  Food Insecurity: Not on file  Transportation Needs: Not on file  Physical Activity: Not on file  Stress: Not on file  Social Connections: Not on file  Intimate Partner Violence: Not on file    Family History  Problem Relation Age of Onset   Heart disease Father        died Mi 35   Hypertension Maternal Grandfather    Diabetes Maternal Grandfather    Heart disease Maternal Grandfather    Colon  cancer Maternal Grandfather    Rectal cancer Maternal Grandfather    Diabetes Maternal Grandmother    Prostate cancer Neg Hx    Breast cancer Neg Hx      Review of Systems  Constitutional: Negative.  Negative for chills and fever.  HENT:  Positive for congestion. Negative for sore throat.   Respiratory: Negative.  Negative for cough and shortness of breath.   Cardiovascular: Negative.  Negative for chest pain  and palpitations.  Gastrointestinal: Negative.  Negative for abdominal pain, diarrhea, nausea and vomiting.  Genitourinary: Negative.  Negative for dysuria and hematuria.  Skin: Negative.  Negative for rash.  Neurological: Negative.  Negative for dizziness and headaches.  All other systems reviewed and are negative.   Physical Exam Vitals reviewed.  Constitutional:      Appearance: Normal appearance.  HENT:     Head: Normocephalic.     Right Ear: Tympanic membrane, ear canal and external ear normal.     Left Ear: Tympanic membrane, ear canal and external ear normal.     Nose: Nose normal.     Mouth/Throat:     Mouth: Mucous membranes are moist.     Pharynx: Oropharynx is clear.  Eyes:     Extraocular Movements: Extraocular movements intact.     Conjunctiva/sclera: Conjunctivae normal.     Pupils: Pupils are equal, round, and reactive to light.  Cardiovascular:     Rate and Rhythm: Normal rate and regular rhythm.     Pulses: Normal pulses.     Heart sounds: Normal heart sounds.  Pulmonary:     Effort: Pulmonary effort is normal.     Breath sounds: Normal breath sounds.  Abdominal:     General: There is no distension.     Palpations: Abdomen is soft.     Tenderness: There is no abdominal tenderness.  Musculoskeletal:        General: Normal range of motion.     Cervical back: Normal range of motion and neck supple. No tenderness.     Right lower leg: No edema.     Left lower leg: No edema.  Lymphadenopathy:     Cervical: No cervical adenopathy.  Skin:     General: Skin is warm and dry.     Capillary Refill: Capillary refill takes less than 2 seconds.  Neurological:     General: No focal deficit present.     Mental Status: She is alert and oriented to person, place, and time.  Psychiatric:        Mood and Affect: Mood normal.        Behavior: Behavior normal.     ASSESSMENT & PLAN: Problem List Items Addressed This Visit       Respiratory   Chronic sinusitis - Primary    No signs of active infection.  No need for antibiotics. Recommended to use saline nasal sprays and Flonase as needed.      Relevant Orders   CBC with Differential/Platelet     Other   Prediabetes    Diet and nutrition discussed.  Advised to decrease amount of daily carbohydrate intake.      Relevant Orders   Comprehensive metabolic panel   Hemoglobin A1c   Lipid panel   Patient Instructions  Health Maintenance, Female Adopting a healthy lifestyle and getting preventive care are important in promoting health and wellness. Ask your health care provider about: The right schedule for you to have regular tests and exams. Things you can do on your own to prevent diseases and keep yourself healthy. What should I know about diet, weight, and exercise? Eat a healthy diet  Eat a diet that includes plenty of vegetables, fruits, low-fat dairy products, and lean protein. Do not eat a lot of foods that are high in solid fats, added sugars, or sodium. Maintain a healthy weight Body mass index (BMI) is used to identify weight problems. It estimates body fat based on height and weight. Your  health care provider can help determine your BMI and help you achieve or maintain a healthy weight. Get regular exercise Get regular exercise. This is one of the most important things you can do for your health. Most adults should: Exercise for at least 150 minutes each week. The exercise should increase your heart rate and make you sweat (moderate-intensity exercise). Do  strengthening exercises at least twice a week. This is in addition to the moderate-intensity exercise. Spend less time sitting. Even light physical activity can be beneficial. Watch cholesterol and blood lipids Have your blood tested for lipids and cholesterol at 62 years of age, then have this test every 5 years. Have your cholesterol levels checked more often if: Your lipid or cholesterol levels are high. You are older than 62 years of age. You are at high risk for heart disease. What should I know about cancer screening? Depending on your health history and family history, you may need to have cancer screening at various ages. This may include screening for: Breast cancer. Cervical cancer. Colorectal cancer. Skin cancer. Lung cancer. What should I know about heart disease, diabetes, and high blood pressure? Blood pressure and heart disease High blood pressure causes heart disease and increases the risk of stroke. This is more likely to develop in people who have high blood pressure readings or are overweight. Have your blood pressure checked: Every 3-5 years if you are 107-20 years of age. Every year if you are 69 years old or older. Diabetes Have regular diabetes screenings. This checks your fasting blood sugar level. Have the screening done: Once every three years after age 38 if you are at a normal weight and have a low risk for diabetes. More often and at a younger age if you are overweight or have a high risk for diabetes. What should I know about preventing infection? Hepatitis B If you have a higher risk for hepatitis B, you should be screened for this virus. Talk with your health care provider to find out if you are at risk for hepatitis B infection. Hepatitis C Testing is recommended for: Everyone born from [birth year] through 1967. Anyone with known risk factors for hepatitis C. Sexually transmitted infections (STIs) Get screened for STIs, including gonorrhea and chlamydia,  if: You are sexually active and are younger than 62 years of age. You are older than 62 years of age and your health care provider tells you that you are at risk for this type of infection. Your sexual activity has changed since you were last screened, and you are at increased risk for chlamydia or gonorrhea. Ask your health care provider if you are at risk. Ask your health care provider about whether you are at high risk for HIV. Your health care provider may recommend a prescription medicine to help prevent HIV infection. If you choose to take medicine to prevent HIV, you should first get tested for HIV. You should then be tested every 3 months for as long as you are taking the medicine. Pregnancy If you are about to stop having your period (premenopausal) and you may become pregnant, seek counseling before you get pregnant. Take 400 to 800 micrograms (mcg) of folic acid every day if you become pregnant. Ask for birth control (contraception) if you want to prevent pregnancy. Osteoporosis and menopause Osteoporosis is a disease in which the bones lose minerals and strength with aging. This can result in bone fractures. If you are 45 years old or older, or if you are at  risk for osteoporosis and fractures, ask your health care provider if you should: Be screened for bone loss. Take a calcium or vitamin D supplement to lower your risk of fractures. Be given hormone replacement therapy (HRT) to treat symptoms of menopause. Follow these instructions at home: Alcohol use Do not drink alcohol if: Your health care provider tells you not to drink. You are pregnant, may be pregnant, or are planning to become pregnant. If you drink alcohol: Limit how much you have to: 0-1 drink a day. Know how much alcohol is in your drink. In the [place] one drink equals one 12 oz bottle of beer (355 mL), one 5 oz glass of wine (148 mL), or one 1 oz glass of hard liquor (44 mL). Lifestyle Do not use any products that  contain nicotine or tobacco. These products include cigarettes, chewing tobacco, and vaping devices, such as e-cigarettes. If you need help quitting, ask your health care provider. Do not use street drugs. Do not share needles. Ask your health care provider for help if you need support or information about quitting drugs. General instructions Schedule regular health, dental, and eye exams. Stay current with your vaccines. Tell your health care provider if: You often feel depressed. You have ever been abused or do not feel safe at home. Summary Adopting a healthy lifestyle and getting preventive care are important in promoting health and wellness. Follow your health care provider's instructions about healthy diet, exercising, and getting tested or screened for diseases. Follow your health care provider's instructions on monitoring your cholesterol and blood pressure. This information is not intended to replace advice given to you by your health care provider. Make sure you discuss any questions you have with your health care provider. Document Revised: 01/19/2021 Document Reviewed: 01/19/2021 Elsevier Patient Education  2022 [place], MD [facility]

## 2021-08-25 NOTE — Assessment & Plan Note (Signed)
Diet and nutrition discussed.  Advised to decrease amount of daily carbohydrate intake. 

## 2021-08-26 LAB — CBC WITH DIFFERENTIAL/PLATELET
Basophils Absolute: 0.1 10*3/uL (ref 0.0–0.1)
Basophils Relative: 0.9 % (ref 0.0–3.0)
Eosinophils Absolute: 0.1 10*3/uL (ref 0.0–0.7)
Eosinophils Relative: 2 % (ref 0.0–5.0)
HCT: 40.6 % (ref 36.0–46.0)
Hemoglobin: 12.7 g/dL (ref 12.0–15.0)
Lymphocytes Relative: 24 % (ref 12.0–46.0)
Lymphs Abs: 1.5 10*3/uL (ref 0.7–4.0)
MCHC: 31.3 g/dL (ref 30.0–36.0)
MCV: 76.1 fl — ABNORMAL LOW (ref 78.0–100.0)
Monocytes Absolute: 0.7 10*3/uL (ref 0.1–1.0)
Monocytes Relative: 11.5 % (ref 3.0–12.0)
Neutro Abs: 3.8 10*3/uL (ref 1.4–7.7)
Neutrophils Relative %: 61.6 % (ref 43.0–77.0)
Platelets: 205 10*3/uL (ref 150.0–400.0)
RBC: 5.34 Mil/uL — ABNORMAL HIGH (ref 3.87–5.11)
RDW: 15 % (ref 11.5–15.5)
WBC: 6.2 10*3/uL (ref 4.0–10.5)

## 2021-08-26 LAB — LIPID PANEL
Cholesterol: 137 mg/dL (ref 0–200)
HDL: 46.5 mg/dL (ref >39.00)
LDL Cholesterol: 63 mg/dL (ref 0–99)
NonHDL: 90.88
Total CHOL/HDL Ratio: 3
Triglycerides: 141 mg/dL (ref 0.0–149.0)
VLDL: 28.2 mg/dL (ref 0.0–40.0)

## 2021-08-26 LAB — COMPREHENSIVE METABOLIC PANEL
ALT: 20 U/L (ref 0–35)
AST: 22 U/L (ref 0–37)
Albumin: 4.2 g/dL (ref 3.5–5.2)
Alkaline Phosphatase: 56 U/L (ref 39–117)
BUN: 11 mg/dL (ref 6–23)
CO2: 31 mEq/L (ref 19–32)
Calcium: 9.5 mg/dL (ref 8.4–10.5)
Chloride: 105 mEq/L (ref 96–112)
Creatinine, Ser: 0.96 mg/dL (ref 0.40–1.20)
GFR: 63.54 mL/min (ref >60.00)
Glucose, Bld: 77 mg/dL (ref 70–99)
Potassium: 4.1 mEq/L (ref 3.5–5.1)
Sodium: 140 mEq/L (ref 135–145)
Total Bilirubin: 0.4 mg/dL (ref 0.2–1.2)
Total Protein: 6.8 g/dL (ref 6.0–8.3)

## 2021-08-26 LAB — HEMOGLOBIN A1C: Hgb A1c MFr Bld: 6.1 % (ref 4.6–6.5)

## 2021-09-29 DIAGNOSIS — M542 Cervicalgia: Secondary | ICD-10-CM | POA: Diagnosis not present

## 2021-10-14 ENCOUNTER — Other Ambulatory Visit: Payer: Self-pay | Admitting: Student

## 2021-10-14 DIAGNOSIS — M542 Cervicalgia: Secondary | ICD-10-CM

## 2021-10-27 DIAGNOSIS — R69 Illness, unspecified: Secondary | ICD-10-CM | POA: Diagnosis not present

## 2021-10-27 DIAGNOSIS — F411 Generalized anxiety disorder: Secondary | ICD-10-CM | POA: Diagnosis not present

## 2021-10-27 DIAGNOSIS — F4310 Post-traumatic stress disorder, unspecified: Secondary | ICD-10-CM | POA: Diagnosis not present

## 2021-10-31 ENCOUNTER — Other Ambulatory Visit: Payer: Self-pay

## 2021-10-31 ENCOUNTER — Ambulatory Visit
Admission: RE | Admit: 2021-10-31 | Discharge: 2021-10-31 | Disposition: A | Discharge status: Home / Self Care | Payer: PRIVATE HEALTH INSURANCE | Source: Ambulatory Visit | Attending: Student | Admitting: Student

## 2021-10-31 DIAGNOSIS — M542 Cervicalgia: Secondary | ICD-10-CM

## 2021-10-31 DIAGNOSIS — M2578 Osteophyte, vertebrae: Secondary | ICD-10-CM | POA: Diagnosis not present

## 2021-10-31 DIAGNOSIS — R200 Anesthesia of skin: Secondary | ICD-10-CM | POA: Diagnosis not present

## 2021-10-31 DIAGNOSIS — M4802 Spinal stenosis, cervical region: Secondary | ICD-10-CM | POA: Diagnosis not present

## 2021-11-09 ENCOUNTER — Ambulatory Visit (INDEPENDENT_AMBULATORY_CARE_PROVIDER_SITE_OTHER): Payer: 59 | Admitting: Podiatry

## 2021-11-09 ENCOUNTER — Other Ambulatory Visit: Payer: Self-pay

## 2021-11-09 DIAGNOSIS — M7742 Metatarsalgia, left foot: Secondary | ICD-10-CM | POA: Diagnosis not present

## 2021-11-09 DIAGNOSIS — M7741 Metatarsalgia, right foot: Secondary | ICD-10-CM | POA: Diagnosis not present

## 2021-11-12 ENCOUNTER — Emergency Department (HOSPITAL_COMMUNITY): Admission: EM | Admit: 2021-11-12 | Payer: 59 | Source: Home / Self Care

## 2021-11-12 DIAGNOSIS — Z1389 Encounter for screening for other disorder: Secondary | ICD-10-CM | POA: Diagnosis not present

## 2021-11-12 DIAGNOSIS — Z01419 Encounter for gynecological examination (general) (routine) without abnormal findings: Secondary | ICD-10-CM | POA: Diagnosis not present

## 2021-11-12 DIAGNOSIS — Z124 Encounter for screening for malignant neoplasm of cervix: Secondary | ICD-10-CM | POA: Diagnosis not present

## 2021-11-12 DIAGNOSIS — R69 Illness, unspecified: Secondary | ICD-10-CM | POA: Diagnosis not present

## 2021-11-12 DIAGNOSIS — Z1151 Encounter for screening for human papillomavirus (HPV): Secondary | ICD-10-CM | POA: Diagnosis not present

## 2021-11-13 ENCOUNTER — Other Ambulatory Visit: Payer: 59

## 2021-11-13 ENCOUNTER — Other Ambulatory Visit: Payer: Self-pay

## 2021-11-13 ENCOUNTER — Telehealth (INDEPENDENT_AMBULATORY_CARE_PROVIDER_SITE_OTHER): Payer: 59 | Admitting: Nurse Practitioner

## 2021-11-13 VITALS — BP 102/65 | HR 85

## 2021-11-13 DIAGNOSIS — K529 Noninfective gastroenteritis and colitis, unspecified: Secondary | ICD-10-CM

## 2021-11-13 NOTE — Progress Notes (Signed)
? ?An audio/visual tele-health visit was completed today for this patient. I connected with  [name] on 11/13/21 utilizing audio/visual technology and verified that I am speaking with the correct person using two identifiers. The patient was located at their place of employment, and I was located at the office of [facility] during the encounter. I discussed the limitations of evaluation and management by telemedicine. The patient expressed understanding and agreed to proceed.  ? ? ? ?Subjective:  ?Patient ID: [name], female    DOB: 1960/11/04  Age: 61 y.o. MRN: [id number] ? ?CC:  ?Chief Complaint  ?Patient presents with  ? Abdominal Pain  ?  ? ? ?HPI  ?This patient arrives today for the above. ? ?She tells me she started experiencing crampy abdominal pain yesterday around 3 PM.  About 2 days before that her and her son went out to eat at a [employer], and then they both started experiencing abdominal pain a couple days later.  From 3 PM to 11:45 PM the pain seemed to discontinue and be crampy in seem to slightly increase in intensity until she had a bowel movement.  She tells me the bowel movement was loose and diarrhea and after that the pain started to get a little bit better.  She ended up going to the emergency department but after having her bowel movement and noticing some relief with her symptoms she ended up leaving without being evaluated.  She tells me this morning she had another episode of loose stools, but since then has not had any more loose stool.  She also tells me that her pain in her stomach is getting better.  She denies any nausea.  She is been eating easily to digest food like chicken little soup.  She denies any fever or chills. ? ? ? ?Past Medical History:  ?Diagnosis Date  ? Anemia   ? Anxiety   ? Colon polyp 08/28/2012  ? Tubular adenoma  ? Dense breast   ? Depression   ? Genital warts   ? GERD (gastroesophageal reflux  disease)   ? Heart murmur   ? Hemorrhoids   ? Hx of cardiac catheterization 2009  ? normal coronary arteries  ? Hx of menorrhagia   ? Novasure  ? Hypokalemia   ? Hypomagnesemia   ? Hypophosphatemia   ? Lactose intolerance in adult   ? Meniscus degeneration, right   ? torn r knee/ 10/2019  ? Mitral valve prolapse   ? Normal cardiac stress test 03/09/2012  ? no ischemia  ? Positive PPD   ? Rapid heart beat   ? States "irreg heart beat"  ? Thyroid disease   ? Thyroid nodules  ? ? ? ? ?Family History  ?Problem Relation Age of Onset  ? Heart disease Father   ?     died Mi 50  ? Hypertension Maternal Grandfather   ? Diabetes Maternal Grandfather   ? Heart disease Maternal Grandfather   ? Colon cancer Maternal Grandfather   ? Rectal cancer Maternal Grandfather   ? Diabetes Maternal Grandmother   ? Prostate cancer Neg Hx   ? Breast cancer Neg Hx   ? ? ?Social History  ? ?Social History Narrative  ? 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 She lived in [place] and in [place] for a number of years  her mom is from [facility]. 16 years of education went to college at [employer] lives at home with her son who is in his 75s no pets  ? Neg ets tob etoh hx PA  ? 6 hours of sleep  ? G2P2   ? [id number]  colonoscopy 2009  ? Now running  own business   ? HH of 2   ? Left handed   ? Caffeine use: tea sometimes  ?   ?   ? ?Social History  ? ?Tobacco Use  ? Smoking status: Never  ? Smokeless tobacco: Never  ?Substance Use Topics  ? Alcohol use: No  ? ? ? ?Current Meds  ?Medication Sig  ? Ascorbic Acid (VITAMIN C) 1000 MG tablet Take 2,000 mg by mouth at bedtime.  ? aspirin EC 81 MG EC tablet Take 1 tablet (81 mg total) by mouth daily.  ? Calcium Carb-Cholecalciferol (OYSTER SHELL CALCIUM 250+D PO) Take 2 tablets by mouth at bedtime.  ? Cholecalciferol (VITAMIN D) 50 MCG (2000 UT) tablet Take 4,000 Units by mouth at bedtime.    ? cyclobenzaprine (FLEXERIL) 5 MG tablet Take 1 tablet (5 mg total) by mouth at bedtime as needed for muscle spasms.  ? fluticasone (FLONASE) 50 MCG/ACT nasal spray Place 1 spray into both nostrils at bedtime as needed for allergies or rhinitis.  ? loratadine (CLARITIN) 10 MG tablet Take 10 mg by mouth daily as needed for allergies or rhinitis.  ? losartan (COZAAR) 50 MG tablet Take 25 mg by mouth 2 (two) times daily.  ? metoprolol succinate (TOPROL-XL) 25 MG 24 hr tablet Take 25 mg by mouth 2 (two) times daily.   ? Multiple Vitamins-Minerals (ZINC PO) Take 1 tablet by mouth at bedtime.  ? PARoxetine (PAXIL-CR) 25 MG 24 hr tablet Take 25 mg by mouth daily.  ? Polyethyl Glycol-Propyl Glycol (SYSTANE OP) Place 2 drops into both eyes daily.  ? potassium chloride SA (KLOR-CON M20) 20 MEQ tablet TAKE 2 TABLETS BY MOUTH EVERY DAY  ? TURMERIC PO Take 1 capsule by mouth daily as needed (pain/inflammation).  ? [DISCONTINUED] meloxicam (MOBIC) 15 MG tablet Take 1 tablet (15 mg total) by mouth daily.  ? ? ?ROS:  ?Review of Systems  ?Constitutional:  Positive for chills.  ?Cardiovascular:  Negative for chest pain.  ?Gastrointestinal:  Positive for abdominal pain (cramping) and diarrhea (2 episodes). Negative for heartburn and nausea.  ?Neurological:  Positive for headaches.  ? ? ?Objective:  ? ?Today's Vitals: BP 102/65   Pulse 85   LMP 09/18/2016 (Exact Date)  ?Vitals with BMI 11/13/2021 08/25/2021 07/25/2021  ?Height - 5\' 6"  -  ?Weight - 207 lbs -  ?BMI - 33.43 -  ?Systolic [phone]  ?Diastolic 65 60 82  ?Pulse 85 78 85  ?  ? ?Physical Exam ?Comprehensive physical exam not completed today as office visit was conducted remotely.  Patient appeared well over video..  Patient was alert and oriented, and appeared to have appropriate judgment. ? ? ? ? ? ? ?Assessment and Plan  ? ?1. Gastroenteritis   ? ? ? ?Plan: ?1.  It appears she had gastroenteritis that seems to be improving.  Etiology viral versus food poisoning.  For now  recommend symptom management with over-the-counter Pepto-Bismol as well as as needed Imodium if diarrhea returns.  She is also encouraged to make sure she is hydrating regularly.  She was told if symptoms persist over the weekend and start to become worse she should call  our office Monday for additional evaluation.  She reports her understanding. ? ? ?Tests ordered ?No orders of the defined types were placed in this encounter. ? ? ? ? ?No orders of the defined types were placed in this encounter. ? ? ?Patient to follow-up as needed. ? ?Ailene Ards, NP ? ?

## 2021-11-16 ENCOUNTER — Other Ambulatory Visit: Payer: Self-pay

## 2021-11-16 ENCOUNTER — Ambulatory Visit (INDEPENDENT_AMBULATORY_CARE_PROVIDER_SITE_OTHER): Payer: 59

## 2021-11-16 DIAGNOSIS — F4310 Post-traumatic stress disorder, unspecified: Secondary | ICD-10-CM | POA: Diagnosis not present

## 2021-11-16 DIAGNOSIS — R69 Illness, unspecified: Secondary | ICD-10-CM | POA: Diagnosis not present

## 2021-11-16 DIAGNOSIS — G5763 Lesion of plantar nerve, bilateral lower limbs: Secondary | ICD-10-CM

## 2021-11-16 DIAGNOSIS — F411 Generalized anxiety disorder: Secondary | ICD-10-CM | POA: Diagnosis not present

## 2021-11-16 DIAGNOSIS — M7741 Metatarsalgia, right foot: Secondary | ICD-10-CM

## 2021-11-16 NOTE — Progress Notes (Signed)
SITUATION ?Reason for Consult: Evaluation for Bilateral Custom Foot Orthoses ?Patient / Caregiver Report: Patient is ready for foot orthotics ? ?OBJECTIVE DATA: ?Patient History / Diagnosis:  ?  ICD-10-CM   ?1. Morton's neuroma of both feet  G57.63   ?  ?2. Metatarsalgia of both feet  M77.41   ? M77.42   ?  ? ? ?Current or Previous Devices: None and no history ? ?Foot Examination: ?Skin presentation:   Intact ?Ulcers & Callousing:   None and no history ?Toe / Foot Deformities:  Prominent met heads ?Weight Bearing Presentation:  Rectus ?Sensation:    Intact ? ?Shoe size: 11 ? ?ORTHOTIC RECOMMENDATION ?Recommended Device: 1x pair of custom functional foot orthotics ? ?GOALS OF ORTHOSES ?- Reduce Pain ?- Prevent Foot Deformity ?- Prevent Progression of Further Foot Deformity ?- Relieve Pressure ?- Improve the Overall Biomechanical Function of the Foot and Lower Extremity. ? ?ACTIONS PERFORMED ?Patient was casted for Foot Orthoses via crush box. Procedure was explained and patient tolerated procedure well. All questions were answered and concerns addressed. ? ?PLAN ?Potential out of pocket cost was communicated to patient. Casts are to be sent to [facility] for fabrication. Patient is to be called for fitting when devices are ready.  ? ? ?

## 2021-11-17 ENCOUNTER — Telehealth: Payer: Self-pay

## 2021-11-17 NOTE — Progress Notes (Signed)
° °  HPI: 61 y.o. female presenting today for evaluation of bilateral foot and ankle pain has been going on for about 2 weeks now.  Patient states that her feet and ankles have been stiff for several months however it has been exacerbated over the last 2 weeks.  She denies any injury.  She says she has pain throughout the ball of her feet.  She presents for further treatment and evaluation  Past Medical History:  Diagnosis Date   Anemia    Anxiety    Colon polyp 08/28/2012   Tubular adenoma   Dense breast    Depression    Genital warts    GERD (gastroesophageal reflux disease)    Heart murmur    Hemorrhoids    Hx of cardiac catheterization 2009   normal coronary arteries   Hx of menorrhagia    Novasure   Hypokalemia    Hypomagnesemia    Hypophosphatemia    Lactose intolerance in adult    Meniscus degeneration, right    torn r knee/ 10/2019   Mitral valve prolapse    Normal cardiac stress test 03/09/2012   no ischemia   Positive PPD    Rapid heart beat    States "irreg heart beat"   Thyroid disease    Thyroid nodules    Past Surgical History:  Procedure Laterality Date   CARDIAC CATHETERIZATION     x2 with normal results per pt   NOVASURE ABLATION     UPPER GASTROINTESTINAL ENDOSCOPY  May 2013    Allergies  Allergen Reactions   Ceftriaxone Sodium Itching   Doxycycline Rash     Physical Exam: General: The patient is alert and oriented x3 in no acute distress.  Dermatology: Skin is warm, dry and supple bilateral lower extremities. Negative for open lesions or macerations.  Vascular: Palpable pedal pulses bilaterally. Capillary refill within normal limits.  Negative for any significant edema or erythema  Neurological: Light touch and protective threshold grossly intact  Musculoskeletal Exam: No pedal deformities noted.  There is some tenderness to palpation throughout the bilateral forefoot diffusely throughout the entire metatarsals.  There is no pinpoint  tenderness  Assessment: 1.  Metatarsalgia bilateral   Plan of Care:  1. Patient evaluated. 2.  Continue wearing new balance shoes and advised against going barefoot 3.  Appointment with Pedorthist for custom molded orthotics 4.  Return to clinic as needed      Edrick Kins, DPM [facility]  Dr. Edrick Kins, DPM    [address], [place] [address]                Office [phone]  Fax [phone]

## 2021-11-17 NOTE — Telephone Encounter (Signed)
Casts sent to central fabrication °

## 2021-11-30 DIAGNOSIS — F4310 Post-traumatic stress disorder, unspecified: Secondary | ICD-10-CM | POA: Diagnosis not present

## 2021-11-30 DIAGNOSIS — F411 Generalized anxiety disorder: Secondary | ICD-10-CM | POA: Diagnosis not present

## 2021-11-30 DIAGNOSIS — R69 Illness, unspecified: Secondary | ICD-10-CM | POA: Diagnosis not present

## 2021-12-02 ENCOUNTER — Telehealth: Payer: Self-pay

## 2021-12-02 NOTE — Telephone Encounter (Signed)
Endocrinology referral for what reason?  Please find out.  Thanks.

## 2021-12-02 NOTE — Telephone Encounter (Signed)
Pt is requesting a referral to Endocrinology. Pt hasn't been seen since 2018 so they stated that she would need a referral. ? ?[facility] is the preferred location ? ?Please advise ?

## 2021-12-02 NOTE — Telephone Encounter (Signed)
Sent pt a mychart message to ask why pt needs referral to Endo. ?

## 2021-12-07 ENCOUNTER — Ambulatory Visit (INDEPENDENT_AMBULATORY_CARE_PROVIDER_SITE_OTHER): Payer: 59

## 2021-12-07 ENCOUNTER — Other Ambulatory Visit: Payer: Self-pay

## 2021-12-07 ENCOUNTER — Ambulatory Visit (INDEPENDENT_AMBULATORY_CARE_PROVIDER_SITE_OTHER): Payer: 59 | Admitting: Emergency Medicine

## 2021-12-07 ENCOUNTER — Encounter: Payer: Self-pay | Admitting: Emergency Medicine

## 2021-12-07 VITALS — BP 104/62 | HR 81 | Temp 97.9°F | Ht 66.0 in | Wt 194.5 lb

## 2021-12-07 DIAGNOSIS — E042 Nontoxic multinodular goiter: Secondary | ICD-10-CM | POA: Diagnosis not present

## 2021-12-07 DIAGNOSIS — R7303 Prediabetes: Secondary | ICD-10-CM

## 2021-12-07 DIAGNOSIS — R5383 Other fatigue: Secondary | ICD-10-CM

## 2021-12-07 DIAGNOSIS — J329 Chronic sinusitis, unspecified: Secondary | ICD-10-CM

## 2021-12-07 DIAGNOSIS — R011 Cardiac murmur, unspecified: Secondary | ICD-10-CM | POA: Diagnosis not present

## 2021-12-07 DIAGNOSIS — R059 Cough, unspecified: Secondary | ICD-10-CM | POA: Diagnosis not present

## 2021-12-07 DIAGNOSIS — G9331 Postviral fatigue syndrome: Secondary | ICD-10-CM | POA: Diagnosis not present

## 2021-12-07 DIAGNOSIS — R634 Abnormal weight loss: Secondary | ICD-10-CM | POA: Diagnosis not present

## 2021-12-07 LAB — CBC WITH DIFFERENTIAL/PLATELET
Basophils Absolute: 0 10*3/uL (ref 0.0–0.1)
Basophils Relative: 0.4 % (ref 0.0–3.0)
Eosinophils Absolute: 0.1 10*3/uL (ref 0.0–0.7)
Eosinophils Relative: 1.8 % (ref 0.0–5.0)
HCT: 42.2 % (ref 36.0–46.0)
Hemoglobin: 13.4 g/dL (ref 12.0–15.0)
Lymphocytes Relative: 26.2 % (ref 12.0–46.0)
Lymphs Abs: 1.7 10*3/uL (ref 0.7–4.0)
MCHC: 31.7 g/dL (ref 30.0–36.0)
MCV: 75.6 fl — ABNORMAL LOW (ref 78.0–100.0)
Monocytes Absolute: 0.8 10*3/uL (ref 0.1–1.0)
Monocytes Relative: 11.8 % (ref 3.0–12.0)
Neutro Abs: 3.8 10*3/uL (ref 1.4–7.7)
Neutrophils Relative %: 59.8 % (ref 43.0–77.0)
Platelets: 229 10*3/uL (ref 150.0–400.0)
RBC: 5.59 Mil/uL — ABNORMAL HIGH (ref 3.87–5.11)
RDW: 14.4 % (ref 11.5–15.5)
WBC: 6.4 10*3/uL (ref 4.0–10.5)

## 2021-12-07 LAB — COMPREHENSIVE METABOLIC PANEL
ALT: 21 U/L (ref 0–35)
AST: 22 U/L (ref 0–37)
Albumin: 4.5 g/dL (ref 3.5–5.2)
Alkaline Phosphatase: 63 U/L (ref 39–117)
BUN: 14 mg/dL (ref 6–23)
CO2: 31 mEq/L (ref 19–32)
Calcium: 9.5 mg/dL (ref 8.4–10.5)
Chloride: 105 mEq/L (ref 96–112)
Creatinine, Ser: 0.86 mg/dL (ref 0.40–1.20)
GFR: 72.36 mL/min (ref >60.00)
Glucose, Bld: 94 mg/dL (ref 70–99)
Potassium: 3.5 mEq/L (ref 3.5–5.1)
Sodium: 143 mEq/L (ref 135–145)
Total Bilirubin: 0.5 mg/dL (ref 0.2–1.2)
Total Protein: 7.2 g/dL (ref 6.0–8.3)

## 2021-12-07 LAB — HEMOGLOBIN A1C: Hgb A1c MFr Bld: 6.3 % (ref 4.6–6.5)

## 2021-12-07 LAB — SEDIMENTATION RATE: Sed Rate: 28 mm/hr (ref 0–30)

## 2021-12-07 NOTE — Patient Instructions (Signed)
Fatigue ?If you have fatigue, you feel tired all the time and have a lack of energy or a lack of motivation. Fatigue may make it difficult to start or complete tasks because of exhaustion. In general, occasional or mild fatigue is often a normal response to activity or life. However, long-lasting (chronic) or extreme fatigue may be a symptom of a medical condition. ?Follow these instructions at home: ?General instructions ?Watch your fatigue for any changes. ?Go to bed and get up at the same time every day. ?Avoid fatigue by pacing yourself during the day and getting enough sleep at night. ?Maintain a healthy weight. ?Medicines ?Take over-the-counter and prescription medicines only as told by your health care provider. ?Take a multivitamin, if told by your health care provider.  ?Do not use herbal or dietary supplements unless they are approved by your health care provider. ?Activity ? ?Exercise regularly, as told by your health care provider. ?Use or practice techniques to help you relax, such as yoga, tai chi, meditation, or massage therapy. ?Eating and drinking ? ?Avoid heavy meals in the evening. ?Eat a well-balanced diet, which includes lean proteins, whole grains, plenty of fruits and vegetables, and low-fat dairy products. ?Avoid consuming too much caffeine. ?Avoid the use of alcohol. ?Drink enough fluid to keep your urine pale yellow. ?Lifestyle ?Change situations that cause you stress. Try to keep your work and personal schedule in balance. ?Do not use any products that contain nicotine or tobacco, such as cigarettes and e-cigarettes. If you need help quitting, ask your health care provider. ?Do not use drugs. ?Contact a health care provider if: ?Your fatigue does not get better. ?You have a fever. ?You suddenly lose or gain weight. ?You have headaches. ?You have trouble falling asleep or sleeping through the night. ?You feel angry, guilty, anxious, or sad. ?You are unable to have a bowel movement  (constipation). ?Your skin is dry. ?You have swelling in your legs or another part of your body. ?Get help right away if: ?You feel confused. ?Your vision is blurry. ?You feel faint or you pass out. ?You have a severe headache. ?You have severe pain in your abdomen, your back, or the area between your waist and hips (pelvis). ?You have chest pain, shortness of breath, or an irregular or fast heartbeat. ?You are unable to urinate, or you urinate less than normal. ?You have abnormal bleeding, such as bleeding from the rectum, vagina, nose, lungs, or nipples. ?You vomit blood. ?You have thoughts about hurting yourself or others. ?If you ever feel like you may hurt yourself or others, or have thoughts about taking your own life, get help right away. You can go to your nearest emergency department or call: ?Your local emergency services (911 in the [place] ?A suicide crisis helpline, such as the National Suicide Prevention Lifeline at 1-800-273-8255 or 988 in the [place] This is open 24 hours a day. ?Summary ?If you have fatigue, you feel tired all the time and have a lack of energy or a lack of motivation. ?Fatigue may make it difficult to start or complete tasks because of exhaustion. ?Long-lasting (chronic) or extreme fatigue may be a symptom of a medical condition. ?Exercise regularly, as told by your health care provider. ?Change situations that cause you stress. Try to keep your work and personal schedule in balance. ?This information is not intended to replace advice given to you by your health care provider. Make sure you discuss any questions you have with your health care provider. ?Document Revised:   03/25/2021 Document Reviewed: 07/10/2020 ?Elsevier Patient Education ? 2022 Elsevier Inc. ? ?

## 2021-12-07 NOTE — Progress Notes (Signed)
[name] ?61 y.o. ? ? ?Chief Complaint  ?Patient presents with  ? Fatigue  ?  X 1 month   ? Cough  ?  Coughing up blood, a little   ? Nasal Congestion  ? ? ?HISTORY OF PRESENT ILLNESS: ?This is a 61 y.o. female complaining of general fatigue for 1 month. ?It all started with flulike symptoms 1 month ago with a sore throat followed by persistent cough and nasal/chest congestion. ?Noticed small amounts of blood in the phlegm last week.  Not today. ?Fatigue even before viral syndrome. ?Has history of multinodular goiter. ?Requesting referral to endocrinologist. ? ?Cough ?Associated symptoms include a sore throat and weight loss. Pertinent negatives include no chest pain, chills, ear pain, fever, rash or shortness of breath.  ? ? ?Prior to Admission medications   ?Medication Sig Start Date End Date Taking? Authorizing Provider  ?Ascorbic Acid (VITAMIN C) 1000 MG tablet Take 2,000 mg by mouth at bedtime.   Yes [provider]  ?aspirin EC 81 MG EC tablet Take 1 tablet (81 mg total) by mouth daily. 10/21/19  Yes Dixie Dials, MD  ?Calcium Carb-Cholecalciferol (OYSTER SHELL CALCIUM 250+D PO) Take 2 tablets by mouth at bedtime.   Yes [provider]  ?Cholecalciferol (VITAMIN D) 50 MCG (2000 UT) tablet Take 4,000 Units by mouth at bedtime.    Yes [provider]  ?cyclobenzaprine (FLEXERIL) 5 MG tablet Take 1 tablet (5 mg total) by mouth at bedtime as needed for muscle spasms. 07/17/20  Yes Just, Laurita Quint, FNP  ?fluticasone (FLONASE) 50 MCG/ACT nasal spray Place 1 spray into both nostrils at bedtime as needed for allergies or rhinitis.   Yes [provider]  ?loratadine (CLARITIN) 10 MG tablet Take 10 mg by mouth daily as needed for allergies or rhinitis.   Yes [provider]  ?losartan (COZAAR) 50 MG tablet Take 25 mg by mouth 2 (two) times daily.   Yes [provider]  ?metoprolol succinate (TOPROL-XL) 25 MG 24 hr tablet Take 25 mg by mouth 2 (two) times  daily.    Yes [provider]  ?Multiple Vitamins-Minerals (ZINC PO) Take 1 tablet by mouth at bedtime.   Yes [provider]  ?PARoxetine (PAXIL-CR) 25 MG 24 hr tablet Take 25 mg by mouth daily.   Yes [provider]  ?Polyethyl Glycol-Propyl Glycol (SYSTANE OP) Place 2 drops into both eyes daily.   Yes [provider]  ?potassium chloride SA (KLOR-CON M20) 20 MEQ tablet TAKE 2 TABLETS BY MOUTH EVERY DAY 08/24/21  Yes Sylvestre Rathgeber, Ines Bloomer, MD  ?TURMERIC PO Take 1 capsule by mouth daily as needed (pain/inflammation).   Yes [provider]  ? ? ?Allergies  ?Allergen Reactions  ? Ceftriaxone Sodium Itching  ? Doxycycline Rash  ? ? ?Patient Active Problem List  ? Diagnosis Date Noted  ? Chronic sinusitis 08/25/2021  ? Prediabetes 08/25/2021  ? Paroxysmal SVT (supraventricular tachycardia) ([place]) 03/31/2021  ? Degenerative disc disease, cervical 10/13/2020  ? Stress incontinence of urine 10/31/2018  ? History of diverticulosis 05/04/2017  ? History of PSVT (paroxysmal supraventricular tachycardia) 06/24/2012  ? Multinodular goiter 05/12/2012  ? SLEEP APNEA 01/19/2008  ? IRRITABLE BOWEL SYNDROME 01/18/2008  ? ? ?Past Medical History:  ?Diagnosis Date  ? Anemia   ? Anxiety   ? Colon polyp 08/28/2012  ? Tubular adenoma  ? Dense breast   ? Depression   ? Genital warts   ? GERD (gastroesophageal reflux disease)   ?  Heart murmur   ? Hemorrhoids   ? Hx of cardiac catheterization 2009  ? normal coronary arteries  ? Hx of menorrhagia   ? Novasure  ? Hypokalemia   ? Hypomagnesemia   ? Hypophosphatemia   ? Lactose intolerance in adult   ? Meniscus degeneration, right   ? torn r knee/ 10/2019  ? Mitral valve prolapse   ? Normal cardiac stress test 03/09/2012  ? no ischemia  ? Positive PPD   ? Rapid heart beat   ? States "irreg heart beat"  ? Thyroid disease   ? Thyroid nodules  ? ? ?Past Surgical History:  ?Procedure Laterality Date  ? CARDIAC CATHETERIZATION    ? x2 with normal results  per pt  ? NOVASURE ABLATION    ? UPPER GASTROINTESTINAL ENDOSCOPY  May 2013  ? ? ?Social History  ? ?Socioeconomic History  ? Marital status: Divorced  ?  Spouse name: Not on file  ? Number of children: 2  ? Years of education: 98  ? Highest education level: Not on file  ?Occupational History  ? Occupation: dialysis tech  ?  Employer: [employer]  ?Tobacco Use  ? Smoking status: Never  ? Smokeless tobacco: Never  ?Vaping Use  ? Vaping Use: Never used  ?Substance and Sexual Activity  ? Alcohol use: No  ? Drug use: No  ? Sexual activity: Yes  ?Other Topics Concern  ? Not on file  ?Social History Narrative  ? Ms. 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 She lived in [place] and in [place] for a number of years her mom is from [place]. 16 years of education went to college at [employer] lives at home with her son who is in his 36s no pets  ? Neg ets tob etoh hx PA  ? 6 hours of sleep  ? G2P2   ? [id number]  colonoscopy 2009  ? Now running  own business   ? HH of 2   ? Left handed   ? Caffeine use: tea sometimes  ?   ?   ? ?Social Determinants of Health  ? ?Financial Resource Strain: Not on file  ?Food Insecurity: Not on file  ?Transportation Needs: Not on file  ?Physical Activity: Not on file  ?Stress: Not on file  ?Social Connections: Not on file  ?Intimate Partner Violence: Not on file  ? ? ?Family History  ?Problem Relation Age of Onset  ? Heart disease Father   ?     died Mi 53  ? Hypertension Maternal Grandfather   ? Diabetes Maternal Grandfather   ? Heart disease Maternal Grandfather   ? Colon cancer Maternal Grandfather   ? Rectal cancer Maternal Grandfather   ? Diabetes Maternal Grandmother   ? Prostate cancer Neg Hx   ? Breast cancer Neg Hx   ? ? ? ?Review of Systems  ?Constitutional:  Positive for malaise/fatigue and weight loss. Negative for chills and fever.  ?HENT:  Positive for  congestion and sore throat. Negative for ear pain.   ?Respiratory:  Positive for cough. Negative for shortness of breath.   ?Cardiovascular:  Negative for chest pain and palpitations.  ?Gastrointestinal:  Negative for abdominal pain, nausea and vomiting.  ?Genitourinary:  Negative for dysuria and hematuria.  ?Skin: Negative.  Negative for rash.  ?All other systems reviewed and are negative. ? ?Today's Vitals  ?  12/07/21 1433  ?BP: 104/62  ?Pulse: 81  ?Temp: 97.9 ?F (36.6 ?C)  ?SpO2: 96%  ?Weight: 194 lb 8 oz (88.2 kg)  ?Height: '5\' 6"'$  (1.676 m)  ? ?Body mass index is 31.39 kg/m?. ? ?Physical Exam ?Vitals reviewed.  ?Constitutional:   ?   Appearance: Normal appearance.  ?HENT:  ?   Head: Normocephalic.  ?   Mouth/Throat:  ?   Mouth: Mucous membranes are moist.  ?   Pharynx: Oropharynx is clear. No oropharyngeal exudate or posterior oropharyngeal erythema.  ?Eyes:  ?   Extraocular Movements: Extraocular movements intact.  ?   Conjunctiva/sclera: Conjunctivae normal.  ?   Pupils: Pupils are equal, round, and reactive to light.  ?Cardiovascular:  ?   Rate and Rhythm: Normal rate and regular rhythm.  ?   Pulses: Normal pulses.  ?   Heart sounds: Normal heart sounds.  ?Pulmonary:  ?   Effort: Pulmonary effort is normal.  ?   Breath sounds: Normal breath sounds.  ?Abdominal:  ?   Palpations: Abdomen is soft.  ?   Tenderness: There is no abdominal tenderness.  ?Musculoskeletal:     ?   General: Normal range of motion.  ?   Cervical back: No tenderness.  ?Lymphadenopathy:  ?   Cervical: No cervical adenopathy.  ?Skin: ?   General: Skin is warm and dry.  ?   Capillary Refill: Capillary refill takes less than 2 seconds.  ?Neurological:  ?   General: No focal deficit present.  ?   Mental Status: She is alert and oriented to person, place, and time.  ?Psychiatric:     ?   Mood and Affect: Mood normal.     ?   Behavior: Behavior normal.  ? ?DG Chest 2 View ? ?Result Date: 12/07/2021 ?CLINICAL DATA:  Patient c/o mild cough and  congestion x 3 weeks. Hx of HTN, mitral valve prolapse, and heart murmur. Nonsmoker. Recent viral infection, cough EXAM: CHEST - 2 VIEW COMPARISON:  None. FINDINGS: Normal mediastinum and cardiac silhouette. Nor

## 2021-12-07 NOTE — Assessment & Plan Note (Signed)
Differential diagnosis discussed.  Stable. ?Most likely related to recent viral infection. ?Blood work done today. ?

## 2021-12-07 NOTE — Assessment & Plan Note (Signed)
Differential diagnosis discussed with patient. ?Blood work done today. ?Stable.  Most likely related to recent viral infection. ?

## 2021-12-07 NOTE — Assessment & Plan Note (Signed)
Stable but still having residual symptoms.  Slowly improving. ?Fatigue most likely related to this. ?

## 2021-12-07 NOTE — Assessment & Plan Note (Signed)
Needs reevaluation by endocrinologist.  Referral placed today. ?Clinically euthyroid.  Blood work done today. ?

## 2021-12-08 LAB — THYROID PANEL WITH TSH
Free Thyroxine Index: 1.6 (ref 1.4–3.8)
T3 Uptake: 27 % (ref 22–35)
T4, Total: 6 ug/dL (ref 5.1–11.9)
TSH: 0.66 mIU/L (ref 0.40–4.50)

## 2021-12-17 ENCOUNTER — Encounter: Payer: Self-pay | Admitting: Internal Medicine

## 2021-12-17 ENCOUNTER — Ambulatory Visit (INDEPENDENT_AMBULATORY_CARE_PROVIDER_SITE_OTHER): Payer: 59 | Admitting: Internal Medicine

## 2021-12-17 VITALS — BP 118/60 | HR 62 | Resp 18 | Ht 66.0 in | Wt 194.8 lb

## 2021-12-17 DIAGNOSIS — E042 Nontoxic multinodular goiter: Secondary | ICD-10-CM | POA: Diagnosis not present

## 2021-12-17 NOTE — Patient Instructions (Signed)
We will check the ultrasound of the thyroid. ?

## 2021-12-17 NOTE — Assessment & Plan Note (Signed)
Review of records indicate last US done 2018 with recommendation for biopsy given 30% growth of a nodule which was not completed and no follow up. Concerning given compression type symptoms of dysphagia and weight loss and new symptoms related to possible thyroid. US thyroid ordered and depending on results with order biopsies.  ?

## 2021-12-17 NOTE — Progress Notes (Signed)
? ?  Subjective:  ? ?Patient ID: [name], female    DOB: [birth year], 61 y.o.   MRN: [id number] ? ?HPI ?The patient is a 61 YO female coming in for fatigue. ? ?Review of Systems  ?Constitutional:  Positive for fatigue and unexpected weight change.  ?HENT: Negative.    ?Eyes: Negative.   ?Respiratory:  Negative for cough, chest tightness and shortness of breath.   ?Cardiovascular:  Negative for chest pain, palpitations and leg swelling.  ?Gastrointestinal:  Negative for abdominal distention, abdominal pain, constipation, diarrhea, nausea and vomiting.  ?Endocrine: Positive for cold intolerance and heat intolerance.  ?Musculoskeletal: Negative.   ?Skin: Negative.   ?Neurological: Negative.   ?Psychiatric/Behavioral: Negative.    ? ?Objective:  ?Physical Exam ?Constitutional:   ?   Appearance: She is well-developed.  ?HENT:  ?   Head: Normocephalic and atraumatic.  ?Cardiovascular:  ?   Rate and Rhythm: Normal rate and regular rhythm.  ?Pulmonary:  ?   Effort: Pulmonary effort is normal. No respiratory distress.  ?   Breath sounds: Normal breath sounds. No wheezing or rales.  ?Abdominal:  ?   General: Bowel sounds are normal. There is no distension.  ?   Palpations: Abdomen is soft.  ?   Tenderness: There is no abdominal tenderness. There is no rebound.  ?Musculoskeletal:  ?   Cervical back: Normal range of motion.  ?Skin: ?   General: Skin is warm and dry.  ?Neurological:  ?   Mental Status: She is alert and oriented to person, place, and time.  ?   Coordination: Coordination normal.  ? ? ?Vitals:  ? 12/17/21 1057  ?BP: 118/60  ?Pulse: 62  ?Resp: 18  ?SpO2: 100%  ?Weight: 194 lb 12.8 oz (88.4 kg)  ?Height: '5\' 6"'$  (1.676 m)  ? ? ?This visit occurred during the SARS-CoV-2 public health emergency.  Safety protocols were in place, including screening questions prior to the visit, additional usage of staff PPE, and extensive cleaning of exam room while observing appropriate contact time as indicated for  disinfecting solutions.  ? ?Assessment & Plan:  ? ?

## 2021-12-18 ENCOUNTER — Other Ambulatory Visit: Payer: Self-pay | Admitting: Emergency Medicine

## 2021-12-18 DIAGNOSIS — E876 Hypokalemia: Secondary | ICD-10-CM

## 2021-12-21 ENCOUNTER — Ambulatory Visit
Admission: RE | Admit: 2021-12-21 | Discharge: 2021-12-21 | Disposition: A | Discharge status: Home / Self Care | Payer: 59 | Source: Ambulatory Visit | Attending: Internal Medicine | Admitting: Internal Medicine

## 2021-12-21 DIAGNOSIS — E042 Nontoxic multinodular goiter: Secondary | ICD-10-CM

## 2021-12-22 ENCOUNTER — Other Ambulatory Visit: Payer: Self-pay | Admitting: Internal Medicine

## 2021-12-22 DIAGNOSIS — E042 Nontoxic multinodular goiter: Secondary | ICD-10-CM

## 2021-12-25 ENCOUNTER — Ambulatory Visit: Payer: 59

## 2021-12-25 DIAGNOSIS — G5763 Lesion of plantar nerve, bilateral lower limbs: Secondary | ICD-10-CM

## 2021-12-25 DIAGNOSIS — M7741 Metatarsalgia, right foot: Secondary | ICD-10-CM

## 2021-12-25 NOTE — Progress Notes (Signed)
SITUATION: ?Reason for Visit: Fitting and Delivery of Custom Fabricated Foot Orthoses ?Patient Report: Patient reports comfort and is satisfied with device. ? ?OBJECTIVE DATA: ?Patient History / Diagnosis:   ?  ICD-10-CM   ?1. Morton's neuroma of both feet  G57.63   ?  ?2. Metatarsalgia of both feet  M77.41   ? M77.42   ?  ? ? ?Provided Device:  Custom Functional Foot Orthotics ?    [employer]: [id number] ? ?GOAL OF ORTHOSIS ?- Improve gait ?- Decrease energy expenditure ?- Improve Balance ?- Provide Triplanar stability of foot complex ?- Facilitate motion ? ?ACTIONS PERFORMED ?Patient was fit with foot orthotics trimmed to shoe last. Patient tolerated fittign procedure.  ? ?Patient was provided with verbal and written instruction and demonstration regarding donning, doffing, wear, care, proper fit, function, purpose, cleaning, and use of the orthosis and in all related precautions and risks and benefits regarding the orthosis. ? ?Patient was also provided with verbal instruction regarding how to report any failures or malfunctions of the orthosis and necessary follow up care. Patient was also instructed to contact our office regarding any change in status that may affect the function of the orthosis. ? ?Patient demonstrated independence with proper donning, doffing, and fit and verbalized understanding of all instructions. ? ?PLAN: ?Patient is to follow up in one week or as necessary (PRN). All questions were answered and concerns addressed. Plan of care was discussed with and agreed upon by the patient. ? ?

## 2021-12-31 ENCOUNTER — Ambulatory Visit
Admission: RE | Admit: 2021-12-31 | Discharge: 2021-12-31 | Disposition: A | Discharge status: Home / Self Care | Payer: 59 | Source: Ambulatory Visit | Attending: Internal Medicine | Admitting: Internal Medicine

## 2021-12-31 ENCOUNTER — Other Ambulatory Visit (HOSPITAL_COMMUNITY)
Admission: RE | Admit: 2021-12-31 | Discharge: 2021-12-31 | Disposition: A | Discharge status: Home / Self Care | Payer: 59 | Source: Ambulatory Visit | Attending: Internal Medicine | Admitting: Internal Medicine

## 2021-12-31 DIAGNOSIS — E042 Nontoxic multinodular goiter: Secondary | ICD-10-CM

## 2021-12-31 DIAGNOSIS — E041 Nontoxic single thyroid nodule: Secondary | ICD-10-CM | POA: Diagnosis not present

## 2021-12-31 DIAGNOSIS — D34 Benign neoplasm of thyroid gland: Secondary | ICD-10-CM | POA: Insufficient documentation

## 2022-01-01 LAB — CYTOLOGY - NON PAP

## 2022-01-04 ENCOUNTER — Encounter (HOSPITAL_COMMUNITY): Payer: Self-pay

## 2022-01-04 ENCOUNTER — Ambulatory Visit (HOSPITAL_COMMUNITY)
Admission: EM | Admit: 2022-01-04 | Discharge: 2022-01-04 | Disposition: A | Discharge status: Home / Self Care | Payer: 59 | Attending: Emergency Medicine | Admitting: Emergency Medicine

## 2022-01-04 DIAGNOSIS — Z20822 Contact with and (suspected) exposure to covid-19: Secondary | ICD-10-CM | POA: Diagnosis not present

## 2022-01-04 DIAGNOSIS — R350 Frequency of micturition: Secondary | ICD-10-CM | POA: Diagnosis not present

## 2022-01-04 DIAGNOSIS — J069 Acute upper respiratory infection, unspecified: Secondary | ICD-10-CM | POA: Diagnosis not present

## 2022-01-04 LAB — BASIC METABOLIC PANEL
Anion gap: 7 (ref 5–15)
BUN: 6 mg/dL — ABNORMAL LOW (ref 8–23)
CO2: 27 mmol/L (ref 22–32)
Calcium: 9 mg/dL (ref 8.9–10.3)
Chloride: 104 mmol/L (ref 98–111)
Creatinine, Ser: 0.85 mg/dL (ref 0.44–1.00)
GFR, Estimated: >60 mL/min (ref >60)
Glucose, Bld: 93 mg/dL (ref 70–99)
Potassium: 4.2 mmol/L (ref 3.5–5.1)
Sodium: 138 mmol/L (ref 135–145)

## 2022-01-04 LAB — POCT URINALYSIS DIPSTICK, ED / UC
Bilirubin Urine: NEGATIVE
Glucose, UA: NEGATIVE mg/dL
Hgb urine dipstick: NEGATIVE
Ketones, ur: NEGATIVE mg/dL
Nitrite: NEGATIVE
Protein, ur: NEGATIVE mg/dL
Specific Gravity, Urine: 1.01 (ref 1.005–1.030)
Urobilinogen, UA: 0.2 mg/dL (ref 0.0–1.0)
pH: 7 (ref 5.0–8.0)

## 2022-01-04 MED ORDER — MOLNUPIRAVIR EUA 200MG CAPSULE: 4.0000 | ORAL_CAPSULE | Freq: Two times a day (BID) | ORAL | 0 refills | Status: AC

## 2022-01-04 NOTE — ED Provider Notes (Signed)
[facility]    CSN: [id number] Arrival date & time: 01/04/22  4098      History   Chief Complaint Chief Complaint  Patient presents with   Sore Throat   Headache    HPI [name] is a 61 y.o. female.   Patient presents with fever, chills, fatigue, nasal congestion, rhinorrhea, sore throat, nonproductive cough, generalized headaches, bilateral flank pain and urinary frequency for 3 days.  Sore throat has resolved.  Decreased appetite but tolerating fluids.  No known exposure to COVID, home test negative.  Has attempted use of Mucinex which has been ineffective.  Denies shortness of breath, wheezing, ear pain, dysuria, hematuria, vaginal symptoms.  Past Medical History:  Diagnosis Date   Anemia    Anxiety    Arrhythmia 2013   Colon polyp 08/28/2012   Tubular adenoma   Dense breast    Depression    Genital warts    GERD (gastroesophageal reflux disease)    Heart murmur    Hemorrhoids    Hx of cardiac catheterization 2009   normal coronary arteries   Hx of menorrhagia    Novasure   Hypokalemia    Hypomagnesemia    Hypophosphatemia    Lactose intolerance in adult    Meniscus degeneration, right    torn r knee/ 10/2019   Mitral valve prolapse    Normal cardiac stress test 03/09/2012   no ischemia   Positive PPD    Rapid heart beat    States "irreg heart beat"   Thyroid disease    Thyroid nodules    Patient Active Problem List   Diagnosis Date Noted   Weight loss 12/07/2021   Fatigue 12/07/2021   Post viral syndrome 12/07/2021   Chronic sinusitis 08/25/2021   Prediabetes 08/25/2021   Paroxysmal SVT (supraventricular tachycardia) (HCC) 03/31/2021   Degenerative disc disease, cervical 10/13/2020   Stress incontinence of urine 10/31/2018   History of diverticulosis 05/04/2017   History of PSVT (paroxysmal supraventricular tachycardia) 06/24/2012   Multinodular goiter 05/12/2012   SLEEP APNEA 01/19/2008   IRRITABLE BOWEL SYNDROME  01/18/2008    Past Surgical History:  Procedure Laterality Date   CARDIAC CATHETERIZATION     x2 with normal results per pt   NOVASURE ABLATION     UPPER GASTROINTESTINAL ENDOSCOPY  May 2013    OB History   No obstetric history on file.      Home Medications    Prior to Admission medications   Medication Sig Start Date End Date Taking? Authorizing Provider  Ascorbic Acid (VITAMIN C) 1000 MG tablet Take 2,000 mg by mouth at bedtime.    [provider]  aspirin EC 81 MG EC tablet Take 1 tablet (81 mg total) by mouth daily. 10/21/19   Orpah Cobb, MD  Calcium Carb-Cholecalciferol (OYSTER SHELL CALCIUM 250+D PO) Take 2 tablets by mouth at bedtime.    [provider]  Cholecalciferol (VITAMIN D) 50 MCG (2000 UT) tablet Take 4,000 Units by mouth at bedtime.     [provider]  cyclobenzaprine (FLEXERIL) 5 MG tablet Take 1 tablet (5 mg total) by mouth at bedtime as needed for muscle spasms. 07/17/20   Just, Azalee Course, FNP  fluticasone (FLONASE) 50 MCG/ACT nasal spray Place 1 spray into both nostrils at bedtime as needed for allergies or rhinitis.    [provider]  loratadine (CLARITIN) 10 MG tablet Take 10 mg by mouth daily as needed for allergies or rhinitis.    [provider]  losartan (COZAAR) 50 MG tablet Take 25 mg by mouth 2 (two) times daily.    [provider]  metoprolol succinate (TOPROL-XL) 25 MG 24 hr tablet Take 25 mg by mouth 2 (two) times daily.     [provider]  Multiple Vitamins-Minerals (ZINC PO) Take 1 tablet by mouth at bedtime.    [provider]  PARoxetine (PAXIL-CR) 25 MG 24 hr tablet Take 25 mg by mouth daily.    [provider]  Polyethyl Glycol-Propyl Glycol (SYSTANE OP) Place 2 drops into both eyes daily.    [provider]  potassium chloride SA (KLOR-CON M20) 20 MEQ tablet TAKE 2 TABLETS BY MOUTH EVERY DAY 08/24/21   Georgina Quint, MD  TURMERIC PO Take 1  capsule by mouth daily as needed (pain/inflammation).    [provider]    Family History Family History  Problem Relation Age of Onset   Heart disease Father        died Mi 75   Hypertension Maternal Grandfather    Diabetes Maternal Grandfather    Heart disease Maternal Grandfather    Colon cancer Maternal Grandfather    Rectal cancer Maternal Grandfather    Diabetes Maternal Grandmother    Prostate cancer Neg Hx    Breast cancer Neg Hx     Social History Social History   Tobacco Use   Smoking status: Never   Smokeless tobacco: Never  Vaping Use   Vaping Use: Never used  Substance Use Topics   Alcohol use: No   Drug use: No     Allergies   Ceftriaxone sodium and Doxycycline   Review of Systems Review of Systems  Constitutional:  Positive for chills, fatigue and fever. Negative for activity change, appetite change, diaphoresis and unexpected weight change.  HENT:  Positive for congestion, rhinorrhea and sore throat. Negative for dental problem, drooling, ear discharge, ear pain, facial swelling, hearing loss, mouth sores, nosebleeds, postnasal drip, sinus pressure, sinus pain, sneezing, tinnitus, trouble swallowing and voice change.   Respiratory:  Positive for cough. Negative for apnea, choking, chest tightness, shortness of breath, wheezing and stridor.   Cardiovascular: Negative.   Gastrointestinal: Negative.   Genitourinary:  Positive for flank pain and frequency. Negative for decreased urine volume, difficulty urinating, dyspareunia, dysuria, enuresis, genital sores, hematuria, menstrual problem, pelvic pain, urgency, vaginal bleeding, vaginal discharge and vaginal pain.  Skin: Negative.   Neurological:  Positive for headaches. Negative for dizziness, tremors, seizures, syncope, facial asymmetry, speech difficulty, weakness, light-headedness and numbness.    Physical Exam Triage Vital Signs ED Triage Vitals  Enc Vitals Group     BP 01/04/22 1057  118/76     Pulse Rate 01/04/22 1057 82     Resp 01/04/22 1057 18     Temp 01/04/22 1057 99.2 F (37.3 C)     Temp Source 01/04/22 1057 Oral     SpO2 01/04/22 1057 99 %     Weight --      Height --      Head Circumference --      Peak Flow --      Pain Score 01/04/22 1055 6     Pain Loc --      Pain Edu? --      Excl. in [place]? --    No data found.  Updated Vital Signs BP 118/76 (BP Location: Right Arm)   Pulse 82   Temp 99.2 F (37.3 C) (Oral)   Resp  18   LMP 09/18/2016 (Exact Date)   SpO2 99%   Visual Acuity Right Eye Distance:   Left Eye Distance:   Bilateral Distance:    Right Eye Near:   Left Eye Near:    Bilateral Near:     Physical Exam Constitutional:      Appearance: Normal appearance. She is well-developed.  HENT:     Head: Normocephalic.     Right Ear: Tympanic membrane, ear canal and external ear normal.     Left Ear: Tympanic membrane, ear canal and external ear normal.     Nose: Nose normal.     Mouth/Throat:     Mouth: Mucous membranes are moist.     Pharynx: Oropharynx is clear.     Tonsils: No tonsillar exudate. 0 on the right. 0 on the left.  Eyes:     Extraocular Movements: Extraocular movements intact.  Cardiovascular:     Rate and Rhythm: Normal rate and regular rhythm.     Pulses: Normal pulses.     Heart sounds: Normal heart sounds.  Pulmonary:     Effort: Pulmonary effort is normal.     Breath sounds: Normal breath sounds.  Abdominal:     Tenderness: There is no right CVA tenderness or left CVA tenderness.  Musculoskeletal:     Cervical back: Normal range of motion and neck supple.  Skin:    General: Skin is warm and dry.  Neurological:     Mental Status: She is alert and oriented to person, place, and time. Mental status is at baseline.  Psychiatric:        Mood and Affect: Mood normal.        Behavior: Behavior normal.     UC Treatments / Results  Labs (all labs ordered are listed, but only abnormal results are  displayed) Labs Reviewed - No data to display  EKG   Radiology No results found.  Procedures Procedures (including critical care time)  Medications Ordered in UC Medications - No data to display  Initial Impression / Assessment and Plan / UC Course  I have reviewed the triage vital signs and the nursing notes.  Pertinent labs & imaging results that were available during my care of the patient were reviewed by me and considered in my medical decision making (see chart for details).  Viral URI Exposure to COVID Urinary frequency  Vital signs are stable, well ill-appearing patient is in no signs of distress, COVID test pending, will treat prophylactically with antiviral, discussed administration, recommended additional over-the-counter medications for supportive care, unknown etiology of urinary frequency, urinalysis negative, BMP pending, patient endorses that she generally expels potassium when this occurs, discussed that this urinary frequency and viral symptoms is typically not the cause of a depleted potassium level, discussed findings with patient, recommended watchful waiting for resolution of symptoms with follow-up with urgent care or PCP as needed Final Clinical Impressions(s) / UC Diagnoses   Final diagnoses:  None   Discharge Instructions   None    ED Prescriptions   None    PDMP not reviewed this encounter.   Valinda Hoar, [place] 01/04/22 1218

## 2022-01-04 NOTE — Discharge Instructions (Signed)
Your symptoms today are most likely being caused by a virus and should steadily improve in time it can take up to 7 to 10 days before you truly start to see a turnaround however things will get better ? ?COVID test is pending, you will be notified only if positive ? ?You will be started on antiviral treatment prophylactically, take 4 tablets every morning and evening for the next 5 days, this medication is to ideally reduce the severity and length of your illness ? ?Your lab work is pending, you will be notified of any concerning values ? ?Your urinalysis was negative for infection, continue to monitor urinary symptoms, if they persist you may follow-up with urgent care or your primary physician ? ?May attempt any of the following below in addition for management of your symptoms ?   ?You can take Tylenol and/or Ibuprofen as needed for fever reduction and pain relief. ?  ?For cough: honey 1/2 to 1 teaspoon (you can dilute the honey in water or another fluid).  You can also use guaifenesin and dextromethorphan for cough. You can use a humidifier for chest congestion and cough.  If you don't have a humidifier, you can sit in the bathroom with the hot shower running.    ?  ?For sore throat: try warm salt water gargles, cepacol lozenges, throat spray, warm tea or water with lemon/honey, popsicles or ice, or OTC cold relief medicine for throat discomfort. ?  ?For congestion: take a daily anti-histamine like Zyrtec, Claritin, and a oral decongestant, such as pseudoephedrine.  You can also use Flonase 1-2 sprays in each nostril daily. ?  ?It is important to stay hydrated: drink plenty of fluids (water, gatorade/powerade/pedialyte, juices, or teas) to keep your throat moisturized and help further relieve irritation/discomfort.  ?

## 2022-01-04 NOTE — ED Triage Notes (Signed)
Pt was exposed to COVID pt last week. Pt states sore throat, headache, fatigue, stomach pain, and urinary frequency. Pt states she took Mucinex this morning to alleviate symptoms. ?

## 2022-01-05 ENCOUNTER — Telehealth: Payer: 59 | Admitting: Emergency Medicine

## 2022-01-05 ENCOUNTER — Telehealth: Payer: Self-pay | Admitting: Emergency Medicine

## 2022-01-05 NOTE — Telephone Encounter (Signed)
Attempted to reach patient. No answer.

## 2022-01-12 DIAGNOSIS — Z6832 Body mass index (BMI) 32.0-32.9, adult: Secondary | ICD-10-CM | POA: Diagnosis not present

## 2022-01-12 DIAGNOSIS — M542 Cervicalgia: Secondary | ICD-10-CM | POA: Diagnosis not present

## 2022-01-12 DIAGNOSIS — M47812 Spondylosis without myelopathy or radiculopathy, cervical region: Secondary | ICD-10-CM | POA: Diagnosis not present

## 2022-01-23 ENCOUNTER — Ambulatory Visit (HOSPITAL_COMMUNITY)
Admission: EM | Admit: 2022-01-23 | Discharge: 2022-01-23 | Disposition: A | Discharge status: Home / Self Care | Payer: 59 | Attending: Physician Assistant | Admitting: Physician Assistant

## 2022-01-23 ENCOUNTER — Encounter (HOSPITAL_COMMUNITY): Payer: Self-pay | Admitting: *Deleted

## 2022-01-23 ENCOUNTER — Other Ambulatory Visit: Payer: Self-pay

## 2022-01-23 ENCOUNTER — Other Ambulatory Visit: Payer: Self-pay | Admitting: Emergency Medicine

## 2022-01-23 DIAGNOSIS — R051 Acute cough: Secondary | ICD-10-CM

## 2022-01-23 DIAGNOSIS — E876 Hypokalemia: Secondary | ICD-10-CM

## 2022-01-23 DIAGNOSIS — J3489 Other specified disorders of nose and nasal sinuses: Secondary | ICD-10-CM | POA: Diagnosis not present

## 2022-01-23 DIAGNOSIS — J0110 Acute frontal sinusitis, unspecified: Secondary | ICD-10-CM | POA: Diagnosis not present

## 2022-01-23 DIAGNOSIS — R0981 Nasal congestion: Secondary | ICD-10-CM

## 2022-01-23 MED ORDER — AZITHROMYCIN 250 MG PO TABS: 250.0000 mg | ORAL_TABLET | Freq: Once | ORAL | 0 refills | Status: AC

## 2022-01-23 NOTE — ED Provider Notes (Signed)
?[facility] ? ? ? ?CSN: [id number] ?Arrival date & time: 01/23/22  1035 ? ? ?  ? ?History   ?Chief Complaint ?Chief Complaint  ?Patient presents with  ? Sore Throat  ? Cough  ? ? ?HPI ?[name] is a 61 y.o. female.  ? ?61 year old female presents with sinus pain pressure, and cough.  Patient relates for the past 7 days she has been having persistent sinus pain, pressure, and frontal tenderness.  Patient relates that she has been having production, which is purulent, and thick.  Patient has been taking over-the-counter Mucinex, and a nasal spray, but minimal relief.  Patient has had associated postnasal drip, and mild sore throat, but relates no fever or chills.  Patient has been having some mild body aches and pain.  Has had some chest congestion with cough, but no shortness of breath. ? ? ?Sore Throat ? ?Cough ?Associated symptoms: rhinorrhea and sore throat   ? ?Past Medical History:  ?Diagnosis Date  ? Anemia   ? Anxiety   ? Arrhythmia 2013  ? Colon polyp 08/28/2012  ? Tubular adenoma  ? Dense breast   ? Depression   ? Genital warts   ? GERD (gastroesophageal reflux disease)   ? Heart murmur   ? Hemorrhoids   ? Hx of cardiac catheterization 2009  ? normal coronary arteries  ? Hx of menorrhagia   ? Novasure  ? Hypokalemia   ? Hypomagnesemia   ? Hypophosphatemia   ? Lactose intolerance in adult   ? Meniscus degeneration, right   ? torn r knee/ 10/2019  ? Mitral valve prolapse   ? Normal cardiac stress test 03/09/2012  ? no ischemia  ? Positive PPD   ? Rapid heart beat   ? States "irreg heart beat"  ? Thyroid disease   ? Thyroid nodules  ? ? ?Patient Active Problem List  ? Diagnosis Date Noted  ? Weight loss 12/07/2021  ? Fatigue 12/07/2021  ? Post viral syndrome 12/07/2021  ? Chronic sinusitis 08/25/2021  ? Prediabetes 08/25/2021  ? Paroxysmal SVT (supraventricular tachycardia) ([place]) 03/31/2021  ? Degenerative disc disease, cervical 10/13/2020  ? Stress incontinence of urine 10/31/2018  ?  History of diverticulosis 05/04/2017  ? History of PSVT (paroxysmal supraventricular tachycardia) 06/24/2012  ? Multinodular goiter 05/12/2012  ? SLEEP APNEA 01/19/2008  ? IRRITABLE BOWEL SYNDROME 01/18/2008  ? ? ?Past Surgical History:  ?Procedure Laterality Date  ? CARDIAC CATHETERIZATION    ? x2 with normal results per pt  ? NOVASURE ABLATION    ? UPPER GASTROINTESTINAL ENDOSCOPY  May 2013  ? ? ?OB History   ?No obstetric history on file. ?  ? ? ? ?Home Medications   ? ?Prior to Admission medications   ?Medication Sig Start Date End Date Taking? Authorizing Provider  ?azithromycin (ZITHROMAX Z-PAK) 250 MG tablet Take 1 tablet (250 mg total) by mouth once for 1 dose. 01/23/22 01/23/22 Yes Nyoka Lint, PA-C  ?Ascorbic Acid (VITAMIN C) 1000 MG tablet Take 2,000 mg by mouth at bedtime.    [provider]  ?aspirin EC 81 MG EC tablet Take 1 tablet (81 mg total) by mouth daily. 10/21/19   Dixie Dials, MD  ?Calcium Carb-Cholecalciferol (OYSTER SHELL CALCIUM 250+D PO) Take 2 tablets by mouth at bedtime.    [provider]  ?Cholecalciferol (VITAMIN D) 50 MCG (2000 UT) tablet Take 4,000 Units by mouth at bedtime.     [provider]  ?cyclobenzaprine (FLEXERIL) 5 MG tablet Take  1 tablet (5 mg total) by mouth at bedtime as needed for muscle spasms. 07/17/20   Just, Laurita Quint, FNP  ?fluticasone (FLONASE) 50 MCG/ACT nasal spray Place 1 spray into both nostrils at bedtime as needed for allergies or rhinitis.    [provider]  ?loratadine (CLARITIN) 10 MG tablet Take 10 mg by mouth daily as needed for allergies or rhinitis.    [provider]  ?losartan (COZAAR) 50 MG tablet Take 25 mg by mouth 2 (two) times daily.    [provider]  ?metoprolol succinate (TOPROL-XL) 25 MG 24 hr tablet Take 25 mg by mouth 2 (two) times daily.     [provider]  ?Multiple Vitamins-Minerals (ZINC PO) Take 1 tablet by mouth at bedtime.    [provider]  ?PARoxetine  (PAXIL-CR) 25 MG 24 hr tablet Take 25 mg by mouth daily.    [provider]  ?Polyethyl Glycol-Propyl Glycol (SYSTANE OP) Place 2 drops into both eyes daily.    [provider]  ?potassium chloride SA (KLOR-CON M20) 20 MEQ tablet TAKE 2 TABLETS BY MOUTH EVERY DAY 08/24/21   Horald Pollen, MD  ?TURMERIC PO Take 1 capsule by mouth daily as needed (pain/inflammation).    [provider]  ? ? ?Family History ?Family History  ?Problem Relation Age of Onset  ? Heart disease Father   ?     died Mi 66  ? Hypertension Maternal Grandfather   ? Diabetes Maternal Grandfather   ? Heart disease Maternal Grandfather   ? Colon cancer Maternal Grandfather   ? Rectal cancer Maternal Grandfather   ? Diabetes Maternal Grandmother   ? Prostate cancer Neg Hx   ? Breast cancer Neg Hx   ? ? ?Social History ?Social History  ? ?Tobacco Use  ? Smoking status: Never  ? Smokeless tobacco: Never  ?Vaping Use  ? Vaping Use: Never used  ?Substance Use Topics  ? Alcohol use: No  ? Drug use: No  ? ? ? ?Allergies   ?Ceftriaxone sodium and Doxycycline ? ? ?Review of Systems ?Review of Systems  ?HENT:  Positive for postnasal drip, rhinorrhea, sinus pressure, sinus pain and sore throat.   ?Respiratory:  Positive for cough.   ? ? ?Physical Exam ?Triage Vital Signs ?ED Triage Vitals  ?Enc Vitals Group  ?   BP 01/23/22 1116 103/72  ?   Pulse Rate 01/23/22 1116 62  ?   Resp 01/23/22 1116 18  ?   Temp 01/23/22 1116 98.6 ?F (37 ?C)  ?   Temp src --   ?   SpO2 01/23/22 1116 100 %  ?   Weight --   ?   Height --   ?   Head Circumference --   ?   Peak Flow --   ?   Pain Score 01/23/22 1114 1  ?   Pain Loc --   ?   Pain Edu? --   ?   Excl. in [place]? --   ? ?No data found. ? ?Updated Vital Signs ?BP 103/72   Pulse 62   Temp 98.6 ?F (37 ?C)   Resp 18   LMP 09/18/2016 (Exact Date)   SpO2 100%  ? ?Visual Acuity ?Right Eye Distance:   ?Left Eye Distance:   ?Bilateral Distance:   ? ?Right Eye Near:   ?Left Eye Near:    ?Bilateral  Near:    ? ?Physical Exam ?Constitutional:   ?   Appearance: She is  well-developed.  ?HENT:  ?   Right Ear: Tympanic membrane is injected.  ?   Left Ear: Tympanic membrane is injected.  ?   Ears:  ?   Comments: TMs: Fluid present bilaterally but no redness noted ?   Mouth/Throat:  ?   Mouth: Mucous membranes are moist.  ?   Pharynx: Oropharynx is clear.  ?   Comments: Mouth: Pharynx is clear without any redness or exudate. ?Sinus: Tenderness is palpated along the frontal maxillary areas. ?Neck:  ?   Comments: Neck: No lymphadenopathy noted bilaterally. ?Cardiovascular:  ?   Rate and Rhythm: Normal rate and regular rhythm.  ?Pulmonary:  ?   Effort: Pulmonary effort is normal.  ?   Breath sounds: Normal breath sounds and air entry.  ?   Comments: Lungs: Normal breath sounds without rales, rhonchi, or wheezes. ?Neurological:  ?   Mental Status: She is alert.  ? ? ? ?UC Treatments / Results  ?Labs ?(all labs ordered are listed, but only abnormal results are displayed) ?Labs Reviewed - No data to display ? ?EKG ? ? ?Radiology ?No results found. ? ?Procedures ?Procedures (including critical care time) ? ?Medications Ordered in UC ?Medications - No data to display ? ?Initial Impression / Assessment and Plan / UC Course  ?I have reviewed the triage vital signs and the nursing notes. ? ?Pertinent labs & imaging results that were available during my care of the patient were reviewed by me and considered in my medical decision making (see chart for details). ? ?  ?Plan: ?Advised to continue taking her over-the-counter medicine as directed. ?Patient advised to follow-up with PCP if symptoms fail to improve. ?Final Clinical Impressions(s) / UC Diagnoses  ? ?Final diagnoses:  ?Sinus congestion  ?Frontal sinus pain  ?Acute cough  ?Acute non-recurrent frontal sinusitis  ? ? ? ?Discharge Instructions   ? ?  ?Advised to continue with over-the-counter Mucinex, and ibuprofen for pain. ?Advised to follow-up with PCP if symptoms fail to  improve. ? ? ? ?ED Prescriptions   ? ? Medication Sig Dispense Auth. Provider  ? azithromycin (ZITHROMAX Z-PAK) 250 MG tablet Take 1 tablet (250 mg total) by mouth once for 1 dose. 6 tablet Nyoka Lint, PA-C  ? ?  ?

## 2022-01-23 NOTE — Discharge Instructions (Signed)
Advised to continue with over-the-counter Mucinex, and ibuprofen for pain. ?Advised to follow-up with PCP if symptoms fail to improve. ?

## 2022-01-23 NOTE — ED Triage Notes (Signed)
T reports having sore throat with lots of mucous productions. Pt reports she feels like she has a sinus infection. ?

## 2022-01-25 ENCOUNTER — Other Ambulatory Visit: Payer: Self-pay | Admitting: Emergency Medicine

## 2022-01-25 DIAGNOSIS — E876 Hypokalemia: Secondary | ICD-10-CM

## 2022-01-25 NOTE — Telephone Encounter (Signed)
Patient came into office requesting her Potassium refill. Patient requesting reason for refusal of her medication. If updated labs and OV is needed patient is requesting a short supply to be sent to her pharmacy.  ? ?

## 2022-01-25 NOTE — Telephone Encounter (Signed)
Thank you :)

## 2022-02-01 ENCOUNTER — Ambulatory Visit: Payer: 59 | Admitting: Emergency Medicine

## 2022-02-02 ENCOUNTER — Encounter: Payer: Self-pay | Admitting: Emergency Medicine

## 2022-02-02 ENCOUNTER — Ambulatory Visit (INDEPENDENT_AMBULATORY_CARE_PROVIDER_SITE_OTHER): Payer: 59 | Admitting: Emergency Medicine

## 2022-02-02 VITALS — BP 106/68 | HR 72 | Temp 97.6°F | Ht 66.0 in | Wt 198.0 lb

## 2022-02-02 DIAGNOSIS — R21 Rash and other nonspecific skin eruption: Secondary | ICD-10-CM | POA: Insufficient documentation

## 2022-02-02 DIAGNOSIS — E876 Hypokalemia: Secondary | ICD-10-CM

## 2022-02-02 DIAGNOSIS — E873 Alkalosis: Secondary | ICD-10-CM | POA: Diagnosis not present

## 2022-02-02 MED ORDER — POTASSIUM CHLORIDE CRYS ER 20 MEQ PO TBCR
40.0000 meq | EXTENDED_RELEASE_TABLET | Freq: Every day | ORAL | 3 refills | Status: DC
Start: 2022-02-02 — End: 2023-03-02

## 2022-02-02 NOTE — Assessment & Plan Note (Signed)
No red flag signs or symptoms. Could be drug related or contact dermatitis. Responding well to topical corticosteroid.  Continue hydrocortisone cream.

## 2022-02-02 NOTE — Patient Instructions (Signed)
Hypokalemia Hypokalemia means that the amount of potassium in the blood is lower than normal. Potassium is a mineral (electrolyte) that helps regulate the amount of fluid in the body. It also stimulates muscle tightening (contraction) and helps nerves work properly. Normally, most of the body's potassium is inside cells, and only a very small amount is in the blood. Because the amount in the blood is so small, minor changes to potassium levels in the blood can be life-threatening. What are the causes? This condition may be caused by: Antibiotic medicine. Diarrhea or vomiting. Taking too much of a medicine that helps you have a bowel movement (laxative) can cause diarrhea and lead to hypokalemia. Chronic kidney disease (CKD). Medicines that help the body get rid of excess fluid (diuretics). Eating disorders, such as anorexia or bulimia. Low magnesium levels in the body. Sweating a lot. What are the signs or symptoms? Symptoms of this condition include: Weakness. Constipation. Fatigue. Muscle cramps. Mental confusion. Skipped heartbeats or irregular heartbeat (palpitations). Tingling or numbness. How is this diagnosed? This condition is diagnosed with a blood test. How is this treated? This condition may be treated by: Taking potassium supplements. Adjusting the medicines that you take. Eating more foods that contain a lot of potassium. If your potassium level is very low, you may need to get potassium through an IV and be monitored in the hospital. Follow these instructions at home: Eating and drinking  Eat a healthy diet. A healthy diet includes fresh fruits and vegetables, whole grains, healthy fats, and lean proteins. If told, eat more foods that contain a lot of potassium. These include: Nuts, such as peanuts and pistachios. Seeds, such as sunflower seeds and pumpkin seeds. Peas, lentils, and lima beans. Whole grain and bran cereals and breads. Fresh fruits and vegetables,  such as apricots, avocado, bananas, cantaloupe, kiwi, oranges, tomatoes, asparagus, and potatoes. Juices, such as orange, tomato, and prune. Lean meats, including fish. Milk and milk products, such as yogurt. General instructions Take over-the-counter and prescription medicines only as told by your health care provider. This includes vitamins, natural food products, and supplements. Keep all follow-up visits. This is important. Contact a health care provider if: You have weakness that gets worse. You feel your heart pounding or racing. You vomit. You have diarrhea. You have diabetes and you have trouble keeping your blood sugar in your target range. Get help right away if: You have chest pain. You have shortness of breath. You have vomiting or diarrhea that lasts for more than 2 days. You faint. These symptoms may be an emergency. Get help right away. Call 911. Do not wait to see if the symptoms will go away. Do not drive yourself to the hospital. Summary Hypokalemia means that the amount of potassium in the blood is lower than normal. This condition is diagnosed with a blood test. Hypokalemia may be treated by taking potassium supplements, adjusting the medicines that you take, or eating more foods that are high in potassium. If your potassium level is very low, you may need to get potassium through an IV and be monitored in the hospital. This information is not intended to replace advice given to you by your health care provider. Make sure you discuss any questions you have with your health care provider. Document Revised: 05/14/2021 Document Reviewed: 05/14/2021 Elsevier Patient Education  2023 Elsevier Inc.  

## 2022-02-02 NOTE — Progress Notes (Signed)
[name] 61 y.o.   Chief Complaint  Patient presents with   Rash    Rash around neck, itchy, red    Medication Management    Potassium questions , needs lab     HISTORY OF PRESENT ILLNESS: This is a 61 y.o. female recently had viral infection.  Told she had COVID.  Was started on antiviral medication but only took it for 3 days.  Developed rash around the neck which responded to topical hydrocortisone is now better. Also has questions about potassium supplementation.  Patient has history of hypokalemia first diagnosed in 2013 when she presented to the emergency room with SVT.  Has been told she will need potassium supplementation for the rest of her life.  Patient has a history of familial hypokalemia.  HPI   Prior to Admission medications   Medication Sig Start Date End Date Taking? Authorizing Provider  Ascorbic Acid (VITAMIN C) 1000 MG tablet Take 2,000 mg by mouth at bedtime.   Yes [provider]  aspirin EC 81 MG EC tablet Take 1 tablet (81 mg total) by mouth daily. 10/21/19  Yes Dixie Dials, MD  Calcium Carb-Cholecalciferol (OYSTER SHELL CALCIUM 250+D PO) Take 2 tablets by mouth at bedtime.   Yes [provider]  Cholecalciferol (VITAMIN D) 50 MCG (2000 UT) tablet Take 4,000 Units by mouth at bedtime.    Yes [provider]  cyclobenzaprine (FLEXERIL) 5 MG tablet Take 1 tablet (5 mg total) by mouth at bedtime as needed for muscle spasms. 07/17/20  Yes Just, Laurita Quint, FNP  fluticasone (FLONASE) 50 MCG/ACT nasal spray Place 1 spray into both nostrils at bedtime as needed for allergies or rhinitis.   Yes [provider]  loratadine (CLARITIN) 10 MG tablet Take 10 mg by mouth daily as needed for allergies or rhinitis.   Yes [provider]  losartan (COZAAR) 50 MG tablet Take 25 mg by mouth 2 (two) times daily.   Yes [provider]  metoprolol succinate (TOPROL-XL) 25 MG 24 hr tablet Take 25 mg by mouth 2 (two) times  daily.    Yes [provider]  Multiple Vitamins-Minerals (ZINC PO) Take 1 tablet by mouth at bedtime.   Yes [provider]  PARoxetine (PAXIL-CR) 25 MG 24 hr tablet Take 25 mg by mouth daily.   Yes [provider]  Polyethyl Glycol-Propyl Glycol (SYSTANE OP) Place 2 drops into both eyes daily.   Yes [provider]  TURMERIC PO Take 1 capsule by mouth daily as needed (pain/inflammation).   Yes [provider]  potassium chloride SA (KLOR-CON M20) 20 MEQ tablet Take 2 tablets (40 mEq total) by mouth daily. TAKE 2 TABLETS BY MOUTH EVERY DAY 02/02/22 01/28/23  Horald Pollen, MD    Allergies  Allergen Reactions   Ceftriaxone Sodium Itching   Doxycycline Rash    Patient Active Problem List   Diagnosis Date Noted   Weight loss 12/07/2021   Fatigue 12/07/2021   Post viral syndrome 12/07/2021   Chronic sinusitis 08/25/2021   Prediabetes 08/25/2021   Paroxysmal SVT (supraventricular tachycardia) ([place]) 03/31/2021   Degenerative disc disease, cervical 10/13/2020   Stress incontinence of urine 10/31/2018   History of diverticulosis 05/04/2017   History of PSVT (paroxysmal supraventricular tachycardia) 06/24/2012   Multinodular goiter 05/12/2012   SLEEP APNEA 01/19/2008   IRRITABLE BOWEL SYNDROME 01/18/2008    Past Medical History:  Diagnosis Date   Anemia    Anxiety    Arrhythmia 2013  Colon polyp 08/28/2012   Tubular adenoma   Dense breast    Depression    Genital warts    GERD (gastroesophageal reflux disease)    Heart murmur    Hemorrhoids    Hx of cardiac catheterization 2009   normal coronary arteries   Hx of menorrhagia    Novasure   Hypokalemia    Hypomagnesemia    Hypophosphatemia    Lactose intolerance in adult    Meniscus degeneration, right    torn r knee/ 10/2019   Mitral valve prolapse    Normal cardiac stress test 03/09/2012   no ischemia   Positive PPD    Rapid heart beat    States "irreg heart beat"    Thyroid disease    Thyroid nodules    Past Surgical History:  Procedure Laterality Date   CARDIAC CATHETERIZATION     x2 with normal results per pt   NOVASURE ABLATION     UPPER GASTROINTESTINAL ENDOSCOPY  May 2013    Social History   Socioeconomic History   Marital status: Divorced    Spouse name: Not on file   Number of children: 2   Years of education: 16   Highest education level: Not on file  Occupational History   Occupation: dialysis Engineer, production: [employer]  Tobacco Use   Smoking status: Never   Smokeless tobacco: Never  Vaping Use   Vaping Use: Never used  Substance and Sexual Activity   Alcohol use: No   Drug use: No   Sexual activity: Yes  Other Topics Concern   Not on file  Social History Narrative   [name] is a divorced [place] American female who works as a Engineer, 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 She lived in [place] and in [place] for a number of years her mom is from [place]. 43 years of education went to college at [employer] lives at home with her son who is in his 27s no pets   Neg ets tob etoh hx PA   6 hours of sleep   G2P2    TD2010  colonoscopy 2009   Now running  own business    HH of 2    Left handed    Caffeine use: tea sometimes         Social Determinants of Health   Financial Resource Strain: Not on file  Food Insecurity: Not on file  Transportation Needs: Not on file  Physical Activity: Not on file  Stress: Not on file  Social Connections: Not on file  Intimate Partner Violence: Not on file    Family History  Problem Relation Age of Onset   Heart disease Father        died Mi 45   Hypertension Maternal Grandfather    Diabetes Maternal Grandfather    Heart disease Maternal Grandfather    Colon cancer Maternal Grandfather    Rectal cancer Maternal Grandfather    Diabetes Maternal Grandmother    Prostate cancer Neg Hx    Breast cancer Neg Hx       Review of Systems  Constitutional: Negative.  Negative for chills and fever.  HENT: Negative.  Negative for congestion and sore throat.   Respiratory: Negative.  Negative for cough and shortness of breath.   Cardiovascular: Negative.  Negative for chest pain and palpitations.  Gastrointestinal:  Negative for abdominal pain, diarrhea, nausea and vomiting.  Genitourinary: Negative.  Skin:  Positive for rash.  Neurological:  Negative for dizziness and headaches.  All other systems reviewed and are negative. Today's Vitals   02/02/22 1405  BP: 106/68  Pulse: 72  Temp: 97.6 F (36.4 C)  TempSrc: Oral  SpO2: 96%  Weight: 198 lb (89.8 kg)  Height: '5\' 6"'$  (1.676 m)   Body mass index is 31.96 kg/m.   Physical Exam Vitals reviewed.  Constitutional:      Appearance: Normal appearance.  HENT:     Head: Normocephalic.  Eyes:     Extraocular Movements: Extraocular movements intact.     Conjunctiva/sclera: Conjunctivae normal.     Pupils: Pupils are equal, round, and reactive to light.  Cardiovascular:     Rate and Rhythm: Normal rate and regular rhythm.     Pulses: Normal pulses.     Heart sounds: Normal heart sounds.  Pulmonary:     Effort: Pulmonary effort is normal.     Breath sounds: Normal breath sounds.  Musculoskeletal:        General: Normal range of motion.     Cervical back: No tenderness.  Lymphadenopathy:     Cervical: No cervical adenopathy.  Skin:    General: Skin is warm and dry.     Capillary Refill: Capillary refill takes less than 2 seconds.     Findings: Rash (Faint erythematous rash around the neck) present.  Neurological:     General: No focal deficit present.     Mental Status: She is alert and oriented to person, place, and time.  Psychiatric:        Mood and Affect: Mood normal.        Behavior: Behavior normal.     ASSESSMENT & PLAN: Problem List Items Addressed This Visit       Musculoskeletal and Integument   Rash and nonspecific  skin eruption    No red flag signs or symptoms. Could be drug related or contact dermatitis. Responding well to topical corticosteroid.  Continue hydrocortisone cream.         Genitourinary   Familial hypokalemia - Primary    Most likely tubular pathology familial like Bartter syndrome Needs daily supplementation       Relevant Medications   potassium chloride SA (KLOR-CON M20) 20 MEQ tablet   Other Visit Diagnoses     Hypokalemia due to excessive renal loss of potassium          Patient Instructions  Hypokalemia Hypokalemia means that the amount of potassium in the blood is lower than normal. Potassium is a mineral (electrolyte) that helps regulate the amount of fluid in the body. It also stimulates muscle tightening (contraction) and helps nerves work properly. Normally, most of the body's potassium is inside cells, and only a very small amount is in the blood. Because the amount in the blood is so small, minor changes to potassium levels in the blood can be life-threatening. What are the causes? This condition may be caused by: Antibiotic medicine. Diarrhea or vomiting. Taking too much of a medicine that helps you have a bowel movement (laxative) can cause diarrhea and lead to hypokalemia. Chronic kidney disease (CKD). Medicines that help the body get rid of excess fluid (diuretics). Eating disorders, such as anorexia or bulimia. Low magnesium levels in the body. Sweating a lot. What are the signs or symptoms? Symptoms of this condition include: Weakness. Constipation. Fatigue. Muscle cramps. Mental confusion. Skipped heartbeats or irregular heartbeat (palpitations). Tingling or numbness. How is this diagnosed? This  condition is diagnosed with a blood test. How is this treated? This condition may be treated by: Taking potassium supplements. Adjusting the medicines that you take. Eating more foods that contain a lot of potassium. If your potassium level is very  low, you may need to get potassium through an IV and be monitored in the hospital. Follow these instructions at home: Eating and drinking  Eat a healthy diet. A healthy diet includes fresh fruits and vegetables, whole grains, healthy fats, and lean proteins. If told, eat more foods that contain a lot of potassium. These include: Nuts, such as peanuts and pistachios. Seeds, such as sunflower seeds and pumpkin seeds. Peas, lentils, and lima beans. Whole grain and bran cereals and breads. Fresh fruits and vegetables, such as apricots, avocado, bananas, cantaloupe, kiwi, oranges, tomatoes, asparagus, and potatoes. Juices, such as orange, tomato, and prune. Lean meats, including fish. Milk and milk products, such as yogurt. General instructions Take over-the-counter and prescription medicines only as told by your health care provider. This includes vitamins, natural food products, and supplements. Keep all follow-up visits. This is important. Contact a health care provider if: You have weakness that gets worse. You feel your heart pounding or racing. You vomit. You have diarrhea. You have diabetes and you have trouble keeping your blood sugar in your target range. Get help right away if: You have chest pain. You have shortness of breath. You have vomiting or diarrhea that lasts for more than 2 days. You faint. These symptoms may be an emergency. Get help right away. Call 911. Do not wait to see if the symptoms will go away. Do not drive yourself to the hospital. Summary Hypokalemia means that the amount of potassium in the blood is lower than normal. This condition is diagnosed with a blood test. Hypokalemia may be treated by taking potassium supplements, adjusting the medicines that you take, or eating more foods that are high in potassium. If your potassium level is very low, you may need to get potassium through an IV and be monitored in the hospital. This information is not  intended to replace advice given to you by your health care provider. Make sure you discuss any questions you have with your health care provider. Document Revised: 05/14/2021 Document Reviewed: 05/14/2021 Elsevier Patient Education  [place], MD [facility]

## 2022-02-02 NOTE — Assessment & Plan Note (Signed)
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

## 2022-04-02 ENCOUNTER — Ambulatory Visit (INDEPENDENT_AMBULATORY_CARE_PROVIDER_SITE_OTHER): Payer: 59 | Admitting: Internal Medicine

## 2022-04-02 VITALS — BP 122/64 | HR 71 | Resp 18 | Ht 66.0 in | Wt 195.0 lb

## 2022-04-02 DIAGNOSIS — J309 Allergic rhinitis, unspecified: Secondary | ICD-10-CM

## 2022-04-02 DIAGNOSIS — R21 Rash and other nonspecific skin eruption: Secondary | ICD-10-CM | POA: Diagnosis not present

## 2022-04-02 DIAGNOSIS — J029 Acute pharyngitis, unspecified: Secondary | ICD-10-CM

## 2022-04-02 MED ORDER — DOXYCYCLINE HYCLATE 100 MG PO TABS: 100.0000 mg | ORAL_TABLET | Freq: Two times a day (BID) | ORAL | 0 refills | Status: DC

## 2022-04-02 MED ORDER — AZITHROMYCIN 250 MG PO TABS: ORAL_TABLET | ORAL | 1 refills | Status: AC

## 2022-04-02 MED ORDER — CLOTRIMAZOLE-BETAMETHASONE 1-0.05 % EX CREA: TOPICAL_CREAM | CUTANEOUS | 1 refills | Status: DC

## 2022-04-02 NOTE — Patient Instructions (Signed)
Please take all new medication as prescribed - the antibiotic, and the cream  Please continue all other medications as before, and refills have been done if requested.  Please have the pharmacy call with any other refills you may need.  Please keep your appointments with your specialists as you may have planned

## 2022-04-02 NOTE — Progress Notes (Unsigned)
Patient ID: [name], female   DOB: 09/02/1961, 61 y.o.   MRN: [id number]        Chief Complaint: follow up severe ST       HPI:  [name] is a 61 y.o. female here with c/o severe ST for 3 days with feeling warm at time, but no facial pain, pressure, headache, general weakness and malaise, or cough, and pt denies chest pain, wheezing, increased sob or doe, orthopnea, PND, increased LE swelling, palpitations, dizziness or syncope.  Close relative has proven strep.  Pt denies polydipsia, polyuria, or new focal neuro s/s.   Also incidentally has left itchy red axillary rash for 1 wk without swelling or drainage.  Does have several wks ongoing nasal allergy symptoms with clearish congestion, itch and sneezing, without fever, pain, ST, cough, swelling or wheezing.       Wt Readings from Last 3 Encounters:  04/02/22 195 lb (88.5 kg)  02/02/22 198 lb (89.8 kg)  12/17/21 194 lb 12.8 oz (88.4 kg)   BP Readings from Last 3 Encounters:  04/02/22 122/64  02/02/22 106/68  01/23/22 103/72         Past Medical History:  Diagnosis Date   Anemia    Anxiety    Arrhythmia 2013   Colon polyp 08/28/2012   Tubular adenoma   Dense breast    Depression    Genital warts    GERD (gastroesophageal reflux disease)    Heart murmur    Hemorrhoids    Hx of cardiac catheterization 2009   normal coronary arteries   Hx of menorrhagia    Novasure   Hypokalemia    Hypomagnesemia    Hypophosphatemia    Lactose intolerance in adult    Meniscus degeneration, right    torn r knee/ 10/2019   Mitral valve prolapse    Normal cardiac stress test 03/09/2012   no ischemia   Positive PPD    Rapid heart beat    States "irreg heart beat"   Thyroid disease    Thyroid nodules   Past Surgical History:  Procedure Laterality Date   CARDIAC CATHETERIZATION     x2 with normal results per pt   [place] GASTROINTESTINAL ENDOSCOPY  May 2013    reports that she has never  smoked. She has never used smokeless tobacco. She reports that she does not drink alcohol and does not use drugs. family history includes Colon cancer in her maternal grandfather; Diabetes in her maternal grandfather and maternal grandmother; Heart disease in her father and maternal grandfather; Hypertension in her maternal grandfather; Rectal cancer in her maternal grandfather. Allergies  Allergen Reactions   Ceftriaxone Sodium Itching   Doxycycline Rash   Current Outpatient Medications on File Prior to Visit  Medication Sig Dispense Refill   Ascorbic Acid (VITAMIN C) 1000 MG tablet Take 2,000 mg by mouth at bedtime.     aspirin EC 81 MG EC tablet Take 1 tablet (81 mg total) by mouth daily.     Calcium Carb-Cholecalciferol (OYSTER SHELL CALCIUM 250+D PO) Take 2 tablets by mouth at bedtime.     Cholecalciferol (VITAMIN D) 50 MCG (2000 UT) tablet Take 4,000 Units by mouth at bedtime.      cyclobenzaprine (FLEXERIL) 5 MG tablet Take 1 tablet (5 mg total) by mouth at bedtime as needed for muscle spasms. 10 tablet 1   fluticasone (FLONASE) 50 MCG/ACT nasal spray Place 1 spray into both nostrils at bedtime  as needed for allergies or rhinitis.     loratadine (CLARITIN) 10 MG tablet Take 10 mg by mouth daily as needed for allergies or rhinitis.     losartan (COZAAR) 50 MG tablet Take 25 mg by mouth 2 (two) times daily.     metoprolol succinate (TOPROL-XL) 25 MG 24 hr tablet Take 25 mg by mouth 2 (two) times daily.      Multiple Vitamins-Minerals (ZINC PO) Take 1 tablet by mouth at bedtime.     PARoxetine (PAXIL-CR) 25 MG 24 hr tablet Take 25 mg by mouth daily.     Polyethyl Glycol-Propyl Glycol (SYSTANE OP) Place 2 drops into both eyes daily.     potassium chloride SA (KLOR-CON M20) 20 MEQ tablet Take 2 tablets (40 mEq total) by mouth daily. TAKE 2 TABLETS BY MOUTH EVERY DAY 180 tablet 3   TURMERIC PO Take 1 capsule by mouth daily as needed (pain/inflammation).     No current facility-administered  medications on file prior to visit.        ROS:  All others reviewed and negative.  Objective        PE:  BP 122/64   Pulse 71   Resp 18   Ht '5\' 6"'$  (1.676 m)   Wt 195 lb (88.5 kg)   LMP 09/17/2016 (Approximate)   SpO2 98%   BMI 31.47 kg/m                 Constitutional: Pt appears in NAD               HENT: Head: NCAT.                Right Ear: External ear normal.                 Left Ear: External ear normal.                Eyes: . Pupils are equal, round, and reactive to light. Conjunctivae and EOM are normal               Nose: without d/c or deformity; pharynx with severe erythea and swelling uvula 2+               Neck: Neck supple. Gross normal ROM with bilateral mild submandibular tender LA               Cardiovascular: Normal rate and regular rhythm.                 Pulmonary/Chest: Effort normal and breath sounds without rales or wheezing.                              Neurological: Pt is alert. At baseline orientation, motor grossly intact               Skin: Skin is warm. No rashes, no other new lesions, LE edema - none               Psychiatric: Pt behavior is normal without agitation   Micro: none  Cardiac tracings I have personally interpreted today:  none  Pertinent Radiological findings (summarize): none   Lab Results  Component Value Date   WBC 6.4 12/07/2021   HGB 13.4 12/07/2021   HCT 42.2 12/07/2021   PLT 229.0 12/07/2021   GLUCOSE 93 01/04/2022   CHOL 137 08/25/2021   TRIG 141.0 08/25/2021   HDL 46.50 08/25/2021  LDLCALC 63 08/25/2021   ALT 21 12/07/2021   AST 22 12/07/2021   NA 138 01/04/2022   K 4.2 01/04/2022   CL 104 01/04/2022   CREATININE 0.85 01/04/2022   BUN 6 (L) 01/04/2022   CO2 27 01/04/2022   TSH 0.66 12/07/2021   INR 1.0 05/21/2021   HGBA1C 6.3 12/07/2021   Assessment/Plan:  [name] is a 61 y.o. Black or African American [2] female with  has a past medical history of Anemia, Anxiety, Arrhythmia (2013), Colon  polyp (08/28/2012), Dense breast, Depression, Genital warts, GERD (gastroesophageal reflux disease), Heart murmur, Hemorrhoids, cardiac catheterization (2009), menorrhagia, Hypokalemia, Hypomagnesemia, Hypophosphatemia, Lactose intolerance in adult, Meniscus degeneration, right, Mitral valve prolapse, Normal cardiac stress test (03/09/2012), Positive PPD, Rapid heart beat, and Thyroid disease.  Pharyngitis Severe with marked erythema, swelling but o/w asympt - for zpack asd,  to f/u any worsening symptoms or concerns  Allergic rhinitis Mild uncontrolled, ok for otc allegra 180 qd prn  Rash and nonspecific skin eruption Left axilla  - for lotrisone qd prn,  to f/u any worsening symptoms or concerns  Followup: Return if symptoms worsen or fail to improve.  Cathlean Cower, MD 04/04/2022 1:58 PM [facility]

## 2022-04-04 ENCOUNTER — Encounter: Payer: Self-pay | Admitting: Internal Medicine

## 2022-04-04 DIAGNOSIS — J029 Acute pharyngitis, unspecified: Secondary | ICD-10-CM | POA: Insufficient documentation

## 2022-04-04 DIAGNOSIS — J309 Allergic rhinitis, unspecified: Secondary | ICD-10-CM | POA: Insufficient documentation

## 2022-04-04 NOTE — Assessment & Plan Note (Signed)
Mild uncontrolled, ok for otc allegra 180 qd prn

## 2022-04-04 NOTE — Assessment & Plan Note (Signed)
Left axilla  - for lotrisone qd prn,  to f/u any worsening symptoms or concerns

## 2022-04-04 NOTE — Assessment & Plan Note (Signed)
Severe with marked erythema, swelling but o/w asympt - for zpack asd,  to f/u any worsening symptoms or concerns

## 2022-06-07 DIAGNOSIS — F411 Generalized anxiety disorder: Secondary | ICD-10-CM | POA: Diagnosis not present

## 2022-06-07 DIAGNOSIS — F4310 Post-traumatic stress disorder, unspecified: Secondary | ICD-10-CM | POA: Diagnosis not present

## 2022-06-07 DIAGNOSIS — R69 Illness, unspecified: Secondary | ICD-10-CM | POA: Diagnosis not present

## 2022-06-15 ENCOUNTER — Other Ambulatory Visit: Payer: Self-pay | Admitting: Obstetrics and Gynecology

## 2022-06-15 DIAGNOSIS — Z1231 Encounter for screening mammogram for malignant neoplasm of breast: Secondary | ICD-10-CM

## 2022-06-17 DIAGNOSIS — R69 Illness, unspecified: Secondary | ICD-10-CM | POA: Diagnosis not present

## 2022-06-17 DIAGNOSIS — F411 Generalized anxiety disorder: Secondary | ICD-10-CM | POA: Diagnosis not present

## 2022-06-17 DIAGNOSIS — F4310 Post-traumatic stress disorder, unspecified: Secondary | ICD-10-CM | POA: Diagnosis not present

## 2022-06-30 ENCOUNTER — Emergency Department (HOSPITAL_COMMUNITY): Payer: 59

## 2022-06-30 ENCOUNTER — Encounter (HOSPITAL_COMMUNITY): Payer: Self-pay

## 2022-06-30 ENCOUNTER — Emergency Department (HOSPITAL_COMMUNITY)
Admission: EM | Admit: 2022-06-30 | Discharge: 2022-06-30 | Disposition: A | Discharge status: Home / Self Care | Payer: 59 | Attending: Emergency Medicine | Admitting: Emergency Medicine

## 2022-06-30 DIAGNOSIS — Z7982 Long term (current) use of aspirin: Secondary | ICD-10-CM | POA: Diagnosis not present

## 2022-06-30 DIAGNOSIS — R5381 Other malaise: Secondary | ICD-10-CM | POA: Insufficient documentation

## 2022-06-30 DIAGNOSIS — R531 Weakness: Secondary | ICD-10-CM | POA: Insufficient documentation

## 2022-06-30 DIAGNOSIS — R002 Palpitations: Secondary | ICD-10-CM | POA: Diagnosis not present

## 2022-06-30 DIAGNOSIS — R5383 Other fatigue: Secondary | ICD-10-CM | POA: Diagnosis not present

## 2022-06-30 DIAGNOSIS — Z79899 Other long term (current) drug therapy: Secondary | ICD-10-CM | POA: Insufficient documentation

## 2022-06-30 DIAGNOSIS — M545 Low back pain, unspecified: Secondary | ICD-10-CM | POA: Diagnosis not present

## 2022-06-30 DIAGNOSIS — N3001 Acute cystitis with hematuria: Secondary | ICD-10-CM | POA: Insufficient documentation

## 2022-06-30 DIAGNOSIS — I10 Essential (primary) hypertension: Secondary | ICD-10-CM | POA: Diagnosis not present

## 2022-06-30 DIAGNOSIS — R079 Chest pain, unspecified: Secondary | ICD-10-CM | POA: Diagnosis not present

## 2022-06-30 LAB — URINALYSIS, ROUTINE W REFLEX MICROSCOPIC
Bilirubin Urine: NEGATIVE
Glucose, UA: NEGATIVE mg/dL
Ketones, ur: NEGATIVE mg/dL
Nitrite: NEGATIVE
Protein, ur: NEGATIVE mg/dL
Specific Gravity, Urine: 1.005 (ref 1.005–1.030)
WBC, UA: >50 WBC/hpf — ABNORMAL HIGH (ref 0–5)
pH: 5 (ref 5.0–8.0)

## 2022-06-30 LAB — CBC
HCT: 41.4 % (ref 36.0–46.0)
Hemoglobin: 13 g/dL (ref 12.0–15.0)
MCH: 23.7 pg — ABNORMAL LOW (ref 26.0–34.0)
MCHC: 31.4 g/dL (ref 30.0–36.0)
MCV: 75.4 fL — ABNORMAL LOW (ref 80.0–100.0)
Platelets: 210 10*3/uL (ref 150–400)
RBC: 5.49 MIL/uL — ABNORMAL HIGH (ref 3.87–5.11)
RDW: 14.7 % (ref 11.5–15.5)
WBC: 4.7 10*3/uL (ref 4.0–10.5)
nRBC: 0 % (ref 0.0–0.2)

## 2022-06-30 LAB — BASIC METABOLIC PANEL
Anion gap: 8 (ref 5–15)
BUN: 11 mg/dL (ref 8–23)
CO2: 27 mmol/L (ref 22–32)
Calcium: 9.1 mg/dL (ref 8.9–10.3)
Chloride: 104 mmol/L (ref 98–111)
Creatinine, Ser: 0.85 mg/dL (ref 0.44–1.00)
GFR, Estimated: >60 mL/min (ref >60)
Glucose, Bld: 101 mg/dL — ABNORMAL HIGH (ref 70–99)
Potassium: 4 mmol/L (ref 3.5–5.1)
Sodium: 139 mmol/L (ref 135–145)

## 2022-06-30 LAB — TROPONIN I (HIGH SENSITIVITY)
Troponin I (High Sensitivity): 6 ng/L (ref <18)
Troponin I (High Sensitivity): 13 ng/L (ref <18)

## 2022-06-30 MED ORDER — CEPHALEXIN 500 MG PO CAPS: 500.0000 mg | ORAL_CAPSULE | Freq: Two times a day (BID) | ORAL | 0 refills | Status: DC

## 2022-06-30 MED ORDER — CEPHALEXIN 500 MG PO CAPS: 500.0000 mg | ORAL_CAPSULE | Freq: Two times a day (BID) | ORAL | 0 refills | Status: AC

## 2022-06-30 NOTE — ED Notes (Signed)
Pt did not answer for v/s update will try again

## 2022-06-30 NOTE — ED Notes (Addendum)
Pt states her palpations come and go and is not having any right now. Pt denies CP, SOB, and dizziness at this time. Pt is axox4. VSS. Pt is in no apparent distress at this time.

## 2022-06-30 NOTE — ED Provider Triage Note (Addendum)
Emergency Medicine Provider Triage Evaluation Note  [name] , a 61 y.o. female  was evaluated in triage.  Pt complains of malaise, back pain, and "skipped heart beats" since yesterday.  Now with chest pain over the last 24 to 48 hours following leaving her cardiology appointment.  Was seen for same, was told "it may be due to portion sizes of meals" per patient.  Went to the grocery store after, had worsening intermittent malaise and back pain.  Hx of OSA, paroxysmal SVT, prediabetes, prior cardiac catheterization, anxiety, heart murmur.  Denies active chest pain, shortness of breath, fevers, lightheadedness, dizziness, or LOC.  Review of Systems  Positive:  Negative: See above  Physical Exam  BP 120/79 (BP Location: Right Arm)   Pulse 82   Temp 98.1 F (36.7 C) (Oral)   Resp 14   Ht '5\' 6"'$  (1.676 m)   Wt 88 kg   LMP 09/17/2016 (Approximate)   SpO2 100%   BMI 31.31 kg/m  Gen:   Awake, no distress   Resp:  Normal effort, equal chest rise, CTAB MSK:   Moves extremities without difficulty  Other:  Cardiac murmur appreciated.  RRR.  Radial and PT pulses 2+ bilaterally.  Gait and coordination appears grossly intact.  Mild lumbar right paraspinal muscle tenderness.  Medical Decision Making  Medically screening exam initiated at 12:18 PM.  Appropriate orders placed.  Lakeyta Davelyn Gwinn was informed that the remainder of the evaluation will be completed by another provider, this initial triage assessment does not replace that evaluation, and the importance of remaining in the ED until their evaluation is complete.  Cardiac work-up initiated   Candace Cruise 32/20/25 [id number]    Dorise Bullion [place], [place] 62/37/62 1227

## 2022-06-30 NOTE — ED Provider Notes (Signed)
[facility] EMERGENCY DEPARTMENT Provider Note   CSN: [id number] Arrival date & time: 06/30/22  1126     History [name], paroxysmal SVT, cardiac cath, heart murmur. Chief Complaint  Patient presents with   Fatigue   Palpitations    [name] is a 61 y.o. female.  61 year old female with a past medical history of valve prolapse, cardiac catheterization presents to the ED with a chief complaint of generalized malaise.  Patient reports she was having a normal day yesterday, went grocery shopping when she arrived at home she was unable to actually get all her groceries out of the car as they felt they were too heavy.  She reports overall malaise, began to have some "palpitations ", when all of this was occurring.  She also was complaining of some substernal chest pressure without any radiation, this was accompanied by some shortness of breath with exertion.  Today's visit, she continues to feel "rundown", however chest pain has now subsided.  She did not take any medication for improvement in her symptoms.  Last meal was while waiting for a room in the waiting room approximately 1 hour ago.  Is also endorsing some pain along her back.  She denies any fever, lightheadedness, dizziness, leg swelling.  The history is provided by the patient.  Palpitations Palpitations quality:  Irregular Onset quality:  Sudden Duration:  2 days Timing:  Intermittent Progression:  Partially resolved Chronicity:  New Context: anxiety   Context: not nicotine and not stimulant use   Relieved by:  Nothing Worsened by:  Nothing Associated symptoms: back pain   Associated symptoms: no nausea, no shortness of breath and no vomiting        Home Medications Prior to Admission medications   Medication Sig Start Date End Date Taking? Authorizing Provider  cephALEXin (KEFLEX) 500 MG capsule Take 1 capsule (500 mg total) by mouth 2 (two) times daily for 7 days. 06/30/22 07/07/22 Yes  Narely Nobles, Beverley Fiedler, PA-C  Ascorbic Acid (VITAMIN C) 1000 MG tablet Take 2,000 mg by mouth at bedtime.    [provider]  aspirin EC 81 MG EC tablet Take 1 tablet (81 mg total) by mouth daily. 10/21/19   Dixie Dials, MD  Calcium Carb-Cholecalciferol (OYSTER SHELL CALCIUM 250+D PO) Take 2 tablets by mouth at bedtime.    [provider]  Cholecalciferol (VITAMIN D) 50 MCG (2000 UT) tablet Take 4,000 Units by mouth at bedtime.     [provider]  clotrimazole-betamethasone (LOTRISONE) cream Use as directed once daily as needed 04/02/22   Biagio Borg, MD  cyclobenzaprine (FLEXERIL) 5 MG tablet Take 1 tablet (5 mg total) by mouth at bedtime as needed for muscle spasms. 07/17/20   Just, Laurita Quint, FNP  fluticasone (FLONASE) 50 MCG/ACT nasal spray Place 1 spray into both nostrils at bedtime as needed for allergies or rhinitis.    [provider]  loratadine (CLARITIN) 10 MG tablet Take 10 mg by mouth daily as needed for allergies or rhinitis.    [provider]  losartan (COZAAR) 50 MG tablet Take 25 mg by mouth 2 (two) times daily.    [provider]  metoprolol succinate (TOPROL-XL) 25 MG 24 hr tablet Take 25 mg by mouth 2 (two) times daily.     [provider]  Multiple Vitamins-Minerals (ZINC PO) Take 1 tablet by mouth at bedtime.    [provider]  PARoxetine (PAXIL-CR) 25 MG 24 hr tablet Take 25 mg by mouth  daily.    [provider]  Polyethyl Glycol-Propyl Glycol (SYSTANE OP) Place 2 drops into both eyes daily.    [provider]  potassium chloride SA (KLOR-CON M20) 20 MEQ tablet Take 2 tablets (40 mEq total) by mouth daily. TAKE 2 TABLETS BY MOUTH EVERY DAY 02/02/22 01/28/23  Horald Pollen, MD  TURMERIC PO Take 1 capsule by mouth daily as needed (pain/inflammation).    [provider]      Allergies    Ceftriaxone sodium and Doxycycline    Review of Systems   Review of Systems  Constitutional:   Positive for chills. Negative for fever.  HENT:  Negative for sore throat.   Respiratory:  Negative for shortness of breath.   Cardiovascular:  Positive for palpitations.  Gastrointestinal:  Negative for abdominal pain, nausea and vomiting.  Genitourinary:  Negative for flank pain.  Musculoskeletal:  Positive for back pain.  Skin:  Negative for pallor and wound.  Neurological:  Negative for light-headedness and headaches.  All other systems reviewed and are negative.   Physical Exam Updated Vital Signs BP 117/78   Pulse 66   Temp 98 F (36.7 C)   Resp 16   Ht '5\' 6"'$  (1.676 m)   Wt 88 kg   LMP 09/17/2016 (Approximate)   SpO2 100%   BMI 31.31 kg/m  Physical Exam Vitals and nursing note reviewed.  Constitutional:      Appearance: Normal appearance.  HENT:     Head: Normocephalic and atraumatic.     Mouth/Throat:     Mouth: Mucous membranes are moist.  Eyes:     Pupils: Pupils are equal, round, and reactive to light.  Cardiovascular:     Rate and Rhythm: Normal rate.  Pulmonary:     Effort: Pulmonary effort is normal.     Breath sounds: No wheezing.  Chest:     Chest wall: No tenderness.    Abdominal:     General: Abdomen is flat.     Tenderness: There is no abdominal tenderness.  Musculoskeletal:     Cervical back: Normal range of motion and neck supple.  Skin:    General: Skin is warm and dry.  Neurological:     Mental Status: She is alert and oriented to person, place, and time.     ED Results / Procedures / Treatments   Labs (all labs ordered are listed, but only abnormal results are displayed) Labs Reviewed  BASIC METABOLIC PANEL - Abnormal; Notable for the following components:      Result Value   Glucose, Bld 101 (*)    All other components within normal limits  CBC - Abnormal; Notable for the following components:   RBC 5.49 (*)    MCV 75.4 (*)    MCH 23.7 (*)    All other components within normal limits  URINALYSIS, ROUTINE W REFLEX  MICROSCOPIC - Abnormal; Notable for the following components:   APPearance HAZY (*)    Hgb urine dipstick SMALL (*)    Leukocytes,Ua LARGE (*)    WBC, UA >50 (*)    Bacteria, UA MANY (*)    Non Squamous Epithelial 0-5 (*)    All other components within normal limits  URINE CULTURE  CBG MONITORING, ED  TROPONIN I (HIGH SENSITIVITY)  TROPONIN I (HIGH SENSITIVITY)    EKG EKG Interpretation  Date/Time:  Wednesday June 30 2022 11:50:49 EDT Ventricular Rate:  81 PR Interval:  178 QRS Duration: 68 QT Interval:  374 QTC  Calculation: 434 R Axis:   43 Text Interpretation: Normal sinus rhythm Cannot rule out Anterior infarct , age undetermined Abnormal ECG When compared with ECG of 25-Jul-2021 19:30, No significant change since last tracing Confirmed by Margaretmary Eddy [id number]) on 06/30/2022 8:29:28 PM  Radiology DG Chest 2 View  Result Date: 06/30/2022 CLINICAL DATA:  Chest pain, SHOB, palp EXAM: CHEST - 2 VIEW COMPARISON:  12/07/21 CXR FINDINGS: The heart size and mediastinal contours are within normal limits. Both lungs are clear. The visualized skeletal structures are unremarkable. IMPRESSION: No active cardiopulmonary disease. Electronically Signed   By: Marin Roberts M.D.   On: 06/30/2022 12:36    Procedures Procedures    Medications Ordered in ED Medications - No data to display  ED Course/ Medical Decision Making/ A&P Clinical Course as of 06/30/22 2234  Wed Jun 30, 2022  1953 WBC, UA(!): >50 [JS]  1953 Bacteria, UA(!): MANY [JS]  1953 Leukocytes,Ua(!): LARGE [JS]    Clinical Course User Index [JS] Janeece Fitting, PA-C                           Medical Decision Making Amount and/or Complexity of Data Reviewed Labs: ordered. Decision-making details documented in ED Course. Radiology: ordered.    This patient presents to the ED for concern of weakness, this involves a number of treatment options, and is a complaint that carries with it a high risk of complications  and morbidity.  The differential diagnosis includes ACS, infection.    Co morbidities: Discussed in HPI   Brief History:  Patient here with palpitations, generalized weakness which has been ongoing for the past 2 days.  EMR reviewed including pt PMHx, past surgical history and past visits to ER.   See HPI for more details   Lab Tests:  I ordered and independently interpreted labs.  The pertinent results include:    I personally reviewed all laboratory work and imaging. Metabolic panel without any acute abnormality specifically kidney function within normal limits and no significant electrolyte abnormalities. CBC without leukocytosis or significant anemia.  Troponin obtained x2 which remained flat.  UA with large leukocytes, greater than 50 white blood cell count, many bacteria.   Imaging Studies:  NAD. I personally reviewed all imaging studies and no acute abnormality found. I agree with radiology interpretation.    Cardiac Monitoring:  The patient was maintained on a cardiac monitor.  I personally viewed and interpreted the cardiac monitored which showed an underlying rhythm of: NSR 81 EKG non-ischemic   Social Determinants of Health:  The patient's social determinants of health were a factor in the care of this patient    Problem List / ED Course:  Patient here with generalized weakness, chest pain, no malaise over the last 2 days.  Also felt her heart skipping a beat, she does have a prior history of paroxysmal SVT however this has not occurred in several years.  Arrival patient is overall in well appearance, her vitals are stable no hypoxia or tachycardia she does not have any increased work of breathing.  She does report the chest pain has now subsided after arrival in the emergency department.  Labs obtained and reviewed by me BMP with no electrolyte derangement, creatinine levels within normal limits.  CBC with no leukocytosis, hemoglobin is within normal limits.   Troponins x2 have remained flat, EKG appears to be nonischemic normal sinus rhythm.  Chest x-ray did not show any acute finding.  UA with large white blood cell count, many bacteria, large leukocytes, she is having some frequency this was sent for culture.  I do suspect urinary tract infection at this time.  Patient's urges were reviewed, she does have a prior history of allergic to cephalosporins, with the adverse effect of itching, however does report that she has taken them since and does not recall any signs of anaphylaxis aches. Patient is without any CVA tenderness, she does endorse some chills but she is afebrile here I do not suspect pyelonephritis at this time.  Pain has not subsided, I do have a low suspicion for ACS with a reassuring work-up.  I did discuss with patient going home on a short course of Keflex, she will be called if urine culture grows a different bacteria that is not sensitive to Keflex.  Patient is agreeable of plan and treatment at this time, she is hemodynamically stable for discharge.  Dispostion:  After consideration of the diagnostic results and the patients response to treatment, I feel that the patent would benefit from patient treatment of urinary tract infection with Keflex for the next 7 days.  Hemodynamically stable for discharge.    Portions of this note were generated with [employer]. Dictation errors may occur despite best attempts at proofreading.   Final Clinical Impression(s) / ED Diagnoses Final diagnoses:  Palpitations  Weakness  Acute cystitis with hematuria    Rx / DC Orders ED Discharge Orders          Ordered    cephALEXin (KEFLEX) 500 MG capsule  2 times daily        06/30/22 2223              Janeece Fitting, PA-C 06/30/22 2234    Fransico Meadow, MD 07/01/22 [phone]

## 2022-06-30 NOTE — Discharge Instructions (Addendum)
Prescribed antibiotics on today's visit to help treat your urinary tract infection.  You will need to take 1 tablet twice a day for the next 7 days.  If you experience any back pain, fever, nausea or vomiting you will need to return to the emergency department.

## 2022-06-30 NOTE — ED Triage Notes (Signed)
Pt states that since yesterday she has been feeling malaise, back pain, "skipped beats" in her heart, and now chest pain.

## 2022-07-01 LAB — URINE CULTURE: Culture: NO GROWTH

## 2022-07-05 ENCOUNTER — Ambulatory Visit (INDEPENDENT_AMBULATORY_CARE_PROVIDER_SITE_OTHER): Payer: 59 | Admitting: Emergency Medicine

## 2022-07-05 ENCOUNTER — Encounter: Payer: Self-pay | Admitting: Emergency Medicine

## 2022-07-05 VITALS — BP 116/72 | HR 60 | Temp 98.0°F | Ht 66.0 in | Wt 198.4 lb

## 2022-07-05 DIAGNOSIS — R310 Gross hematuria: Secondary | ICD-10-CM | POA: Diagnosis not present

## 2022-07-05 DIAGNOSIS — Z8052 Family history of malignant neoplasm of bladder: Secondary | ICD-10-CM

## 2022-07-05 DIAGNOSIS — R5381 Other malaise: Secondary | ICD-10-CM

## 2022-07-05 DIAGNOSIS — R7303 Prediabetes: Secondary | ICD-10-CM

## 2022-07-05 DIAGNOSIS — Z87440 Personal history of urinary (tract) infections: Secondary | ICD-10-CM | POA: Diagnosis not present

## 2022-07-05 NOTE — Assessment & Plan Note (Addendum)
Differential diagnosis discussed with patient. Possibly secondary to viral infection. Recent blood work results reviewed with patient.  Unremarkable.

## 2022-07-05 NOTE — Assessment & Plan Note (Signed)
Diet and nutrition discussed. Vies to decrease amount of daily carbohydrate intake and daily calories and increase amount of plant based protein in her diet.

## 2022-07-05 NOTE — Assessment & Plan Note (Signed)
With family history of bladder cancer.  Non-smoker. Needs urological evaluation. Referral to urology placed today.

## 2022-07-05 NOTE — Assessment & Plan Note (Signed)
And intermittent hematuria. Needs urological evaluation and possible cystoscopy.

## 2022-07-05 NOTE — Assessment & Plan Note (Signed)
Positive recent urinalysis but negative urine culture. Still taking cephalexin 500 mg twice a day. Urine culture repeated today. Probably a partially treated UTI.

## 2022-07-05 NOTE — Patient Instructions (Signed)

## 2022-07-05 NOTE — Progress Notes (Signed)
[name] 61 y.o.   Chief Complaint  Patient presents with   Emergency Contraception    ER f/u UTI, patient still feeling some symptoms from her UTI.     HISTORY OF PRESENT ILLNESS: This is a 61 y.o. female recently seen in emergency room on 06/30/2022 complaining of general malaise and palpitations. Normal EKG Normal blood work.  Positive urinalysis but negative urine culture. Took amoxicillin earlier this month for dental procedure. In recent months has seen intermittent bloody mucousy discharge either in the urine or vaginal Normal gynecological examination earlier this year Family history of bladder cancer.  Non-smoker. Today feeling about 60% better. Was given oral antibiotics in the ED for possible UTI.  Still taking cephalexin.  HPI   Prior to Admission medications   Medication Sig Start Date End Date Taking? Authorizing Provider  Ascorbic Acid (VITAMIN C) 1000 MG tablet Take 2,000 mg by mouth at bedtime.   Yes [provider]  aspirin EC 81 MG EC tablet Take 1 tablet (81 mg total) by mouth daily. 10/21/19  Yes Danielle Dials, MD  Calcium Carb-Cholecalciferol (OYSTER SHELL CALCIUM 250+D PO) Take 2 tablets by mouth at bedtime.   Yes [provider]  cephALEXin (KEFLEX) 500 MG capsule Take 1 capsule (500 mg total) by mouth 2 (two) times daily for 7 days. 06/30/22 07/07/22 Yes Hanson, Danielle Fiedler, PA-C  Cholecalciferol (VITAMIN D) 50 MCG (2000 UT) tablet Take 4,000 Units by mouth at bedtime.    Yes [provider]  cyclobenzaprine (FLEXERIL) 5 MG tablet Take 1 tablet (5 mg total) by mouth at bedtime as needed for muscle spasms. 07/17/20  Yes Hanson, Danielle Quint, FNP  losartan (COZAAR) 50 MG tablet Take 25 mg by mouth 2 (two) times daily.   Yes [provider]  metoprolol succinate (TOPROL-XL) 25 MG 24 hr tablet Take 25 mg by mouth 2 (two) times daily.    Yes [provider]  Multiple Vitamins-Minerals (ZINC PO) Take 1 tablet by mouth at  bedtime.   Yes [provider]  PARoxetine (PAXIL-CR) 25 MG 24 hr tablet Take 25 mg by mouth daily.   Yes [provider]  Polyethyl Glycol-Propyl Glycol (SYSTANE OP) Place 2 drops into both eyes daily.   Yes [provider]  potassium chloride SA (KLOR-CON M20) 20 MEQ tablet Take 2 tablets (40 mEq total) by mouth daily. TAKE 2 TABLETS BY MOUTH EVERY DAY 02/02/22 01/28/23 Yes [name], Danielle Bloomer, MD  TURMERIC PO Take 1 capsule by mouth daily as needed (pain/inflammation).   Yes [provider]    Allergies  Allergen Reactions   Doxycycline Rash    Patient Active Problem List   Diagnosis Date Noted   Pharyngitis 04/04/2022   Allergic rhinitis 04/04/2022   Rash and nonspecific skin eruption 02/02/2022   Weight loss 12/07/2021   Fatigue 12/07/2021   Post viral syndrome 12/07/2021   Chronic sinusitis 08/25/2021   Prediabetes 08/25/2021   Paroxysmal SVT (supraventricular tachycardia) 03/31/2021   Degenerative disc disease, cervical 10/13/2020   Stress incontinence of urine 10/31/2018   History of diverticulosis 05/04/2017   History of PSVT (paroxysmal supraventricular tachycardia) 06/24/2012   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   Multinodular goiter 05/12/2012   SLEEP APNEA 01/19/2008   IRRITABLE BOWEL SYNDROME 01/18/2008    Past Medical History:  Diagnosis Date   Anemia    Anxiety    Arrhythmia 2013   Colon polyp 08/28/2012   Tubular adenoma   Dense breast    Depression  Genital warts    GERD (gastroesophageal reflux disease)    Heart murmur    Hemorrhoids    Hx of cardiac catheterization 2009   normal coronary arteries   Hx of menorrhagia    Novasure   Hypokalemia    Hypomagnesemia    Hypophosphatemia    Lactose intolerance in adult    Meniscus degeneration, right    torn r knee/ 10/2019   Mitral valve prolapse    Normal cardiac stress test 03/09/2012   no ischemia   Positive PPD    Rapid heart beat    States "irreg heart  beat"   Thyroid disease    Thyroid nodules    Past Surgical History:  Procedure Laterality Date   CARDIAC CATHETERIZATION     x2 with normal results per pt   NOVASURE ABLATION     UPPER GASTROINTESTINAL ENDOSCOPY  May 2013    Social History   Socioeconomic History   Marital status: Divorced    Spouse name: Not on file   Number of children: 2   Years of education: 16   Highest education level: Not on file  Occupational History   Occupation: dialysis Engineer, production: [employer]  Tobacco Use   Smoking status: Never   Smokeless tobacco: Never  Vaping Use   Vaping Use: Never used  Substance and Sexual Activity   Alcohol use: No   Drug use: No   Sexual activity: Yes  Other Topics Concern   Not on file  Social History Narrative   [name] is a divorced [place] American female who works as a Engineer, 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 She lived in [place] and in [place] for a number of years her mom is from [place]. 79 years of education went to college at [employer] lives at home with her son who is in his 62s no pets   Neg ets tob etoh hx PA   6 hours of sleep   G2P2    TD2010  colonoscopy 2009   Now running  own business    HH of 2    Left handed    Caffeine use: tea sometimes         Social Determinants of Health   Financial Resource Strain: Not on file  Food Insecurity: Not on file  Transportation Needs: Not on file  Physical Activity: Not on file  Stress: Not on file  Social Connections: Not on file  Intimate Partner Violence: Not on file    Family History  Problem Relation Age of Onset   Heart disease Father        died Mi 48   Hypertension Maternal Grandfather    Diabetes Maternal Grandfather    Heart disease Maternal Grandfather    Colon cancer Maternal Grandfather    Rectal cancer Maternal Grandfather    Diabetes Maternal Grandmother    Prostate cancer Neg Hx    Breast  cancer Neg Hx      Review of Systems  Constitutional:  Positive for malaise/fatigue. Negative for chills and fever.  HENT: Negative.  Negative for congestion and sore throat.   Respiratory: Negative.  Negative for cough and shortness of breath.   Cardiovascular: Negative.  Negative for chest pain and palpitations.  Gastrointestinal:  Negative for abdominal pain, diarrhea, nausea and vomiting.  Genitourinary:  Negative for dysuria, flank pain, frequency, hematuria and urgency.  Skin: Negative.  Negative for rash.  Neurological: Negative.  Negative for dizziness and headaches.  All other systems reviewed and are negative.  Today's Vitals   07/05/22 1317  BP: 116/72  Pulse: 60  Temp: 98 F (36.7 C)  TempSrc: Oral  SpO2: 95%  Weight: 198 lb 6 oz (90 kg)  Height: '5\' 6"'$  (1.676 m)   Body mass index is 32.02 kg/m.  Physical Exam Vitals reviewed.  Constitutional:      Appearance: Normal appearance.  HENT:     Head: Normocephalic.     Mouth/Throat:     Mouth: Mucous membranes are moist.     Pharynx: Oropharynx is clear.  Eyes:     Extraocular Movements: Extraocular movements intact.     Pupils: Pupils are equal, round, and reactive to light.  Cardiovascular:     Rate and Rhythm: Normal rate and regular rhythm.     Pulses: Normal pulses.     Heart sounds: Normal heart sounds.  Pulmonary:     Effort: Pulmonary effort is normal.     Breath sounds: Normal breath sounds.  Abdominal:     Palpations: Abdomen is soft.     Tenderness: There is no abdominal tenderness. There is no right CVA tenderness or left CVA tenderness.  Musculoskeletal:        General: Normal range of motion.     Cervical back: No tenderness.  Lymphadenopathy:     Cervical: No cervical adenopathy.  Skin:    General: Skin is warm and dry.  Neurological:     General: No focal deficit present.     Mental Status: She is alert and oriented to person, place, and time.  Psychiatric:        Mood and Affect:  Mood normal.        Behavior: Behavior normal.    ASSESSMENT & PLAN: A total of 42 minutes was spent with the patient and counseling/coordination of care regarding preparing for this visit, review of most recent office visit notes, review of multiple chronic medical problems and their management, review of most recent emergency department visit notes, review of most recent blood work results, review of all medications, possibility of partially treated UTI, need for bladder cancer work-up, prognosis, documentation, and need for follow-up.  Problem List Items Addressed This Visit       Genitourinary   Intermittent gross hematuria - Primary    With family history of bladder cancer.  Non-smoker. Needs urological evaluation. Referral to urology placed today.      Relevant Orders   Ambulatory referral to Urology     Other   Malaise    Differential diagnosis discussed with patient. Possibly secondary to viral infection. Recent blood work results reviewed with patient.  Unremarkable.      Prediabetes    Diet and nutrition discussed. Vies to decrease amount of daily carbohydrate intake and daily calories and increase amount of plant based protein in her diet.      Family history of bladder cancer    And intermittent hematuria. Needs urological evaluation and possible cystoscopy.      Relevant Orders   Ambulatory referral to Urology   Recent urinary tract infection    Positive recent urinalysis but negative urine culture. Still taking cephalexin 500 mg twice a day. Urine culture repeated today. Probably a partially treated UTI.      Relevant Orders   Urine Culture   Patient Instructions  Health Maintenance, Female Adopting a healthy lifestyle and getting preventive care are important in promoting health  and wellness. Ask your health care provider about: The right schedule for you to have regular tests and exams. Things you can do on your own to prevent diseases and keep  yourself healthy. What should I know about diet, weight, and exercise? Eat a healthy diet  Eat a diet that includes plenty of vegetables, fruits, low-fat dairy products, and lean protein. Do not eat a lot of foods that are high in solid fats, added sugars, or sodium. Maintain a healthy weight Body mass index (BMI) is used to identify weight problems. It estimates body fat based on height and weight. Your health care provider can help determine your BMI and help you achieve or maintain a healthy weight. Get regular exercise Get regular exercise. This is one of the most important things you can do for your health. Most adults should: Exercise for at least 150 minutes each week. The exercise should increase your heart rate and make you sweat (moderate-intensity exercise). Do strengthening exercises at least twice a week. This is in addition to the moderate-intensity exercise. Spend less time sitting. Even light physical activity can be beneficial. Watch cholesterol and blood lipids Have your blood tested for lipids and cholesterol at 63 years of age, then have this test every 5 years. Have your cholesterol levels checked more often if: Your lipid or cholesterol levels are high. You are older than 63 years of age. You are at high risk for heart disease. What should I know about cancer screening? Depending on your health history and family history, you may need to have cancer screening at various ages. This may include screening for: Breast cancer. Cervical cancer. Colorectal cancer. Skin cancer. Lung cancer. What should I know about heart disease, diabetes, and high blood pressure? Blood pressure and heart disease High blood pressure causes heart disease and increases the risk of stroke. This is more likely to develop in people who have high blood pressure readings or are overweight. Have your blood pressure checked: Every 3-5 years if you are 74-48 years of age. Every year if you are 52  years old or older. Diabetes Have regular diabetes screenings. This checks your fasting blood sugar level. Have the screening done: Once every three years after age 53 if you are at a normal weight and have a low risk for diabetes. More often and at a younger age if you are overweight or have a high risk for diabetes. What should I know about preventing infection? Hepatitis B If you have a higher risk for hepatitis B, you should be screened for this virus. Talk with your health care provider to find out if you are at risk for hepatitis B infection. Hepatitis C Testing is recommended for: Everyone born from [birth year] through 1967. Anyone with known risk factors for hepatitis C. Sexually transmitted infections (STIs) Get screened for STIs, including gonorrhea and chlamydia, if: You are sexually active and are younger than 63 years of age. You are older than 63 years of age and your health care provider tells you that you are at risk for this type of infection. Your sexual activity has changed since you were last screened, and you are at increased risk for chlamydia or gonorrhea. Ask your health care provider if you are at risk. Ask your health care provider about whether you are at high risk for HIV. Your health care provider may recommend a prescription medicine to help prevent HIV infection. If you choose to take medicine to prevent HIV, you should first get tested  for HIV. You should then be tested every 3 months for as long as you are taking the medicine. Pregnancy If you are about to stop having your period (premenopausal) and you may become pregnant, seek counseling before you get pregnant. Take 400 to 800 micrograms (mcg) of folic acid every day if you become pregnant. Ask for birth control (contraception) if you want to prevent pregnancy. Osteoporosis and menopause Osteoporosis is a disease in which the bones lose minerals and strength with aging. This can result in bone fractures. If you are  86 years old or older, or if you are at risk for osteoporosis and fractures, ask your health care provider if you should: Be screened for bone loss. Take a calcium or vitamin D supplement to lower your risk of fractures. Be given hormone replacement therapy (HRT) to treat symptoms of menopause. Follow these instructions at home: Alcohol use Do not drink alcohol if: Your health care provider tells you not to drink. You are pregnant, may be pregnant, or are planning to become pregnant. If you drink alcohol: Limit how much you have to: 0-1 drink a day. Know how much alcohol is in your drink. In the [place] one drink equals one 12 oz bottle of beer (355 mL), one 5 oz glass of wine (148 mL), or one 1 oz glass of hard liquor (44 mL). Lifestyle Do not use any products that contain nicotine or tobacco. These products include cigarettes, chewing tobacco, and vaping devices, such as e-cigarettes. If you need help quitting, ask your health care provider. Do not use street drugs. Do not share needles. Ask your health care provider for help if you need support or information about quitting drugs. General instructions Schedule regular health, dental, and eye exams. Stay current with your vaccines. Tell your health care provider if: You often feel depressed. You have ever been abused or do not feel safe at home. Summary Adopting a healthy lifestyle and getting preventive care are important in promoting health and wellness. Follow your health care provider's instructions about healthy diet, exercising, and getting tested or screened for diseases. Follow your health care provider's instructions on monitoring your cholesterol and blood pressure. This information is not intended to replace advice given to you by your health care provider. Make sure you discuss any questions you have with your health care provider. Document Revised: 01/19/2021 Document Reviewed: 01/19/2021 Elsevier Patient Education  [place], MD [facility]

## 2022-07-06 LAB — URINE CULTURE: Result:: NO GROWTH

## 2022-07-08 ENCOUNTER — Ambulatory Visit
Admission: RE | Admit: 2022-07-08 | Discharge: 2022-07-08 | Disposition: A | Discharge status: Home / Self Care | Payer: 59 | Source: Ambulatory Visit | Attending: Obstetrics and Gynecology | Admitting: Obstetrics and Gynecology

## 2022-07-08 DIAGNOSIS — Z1231 Encounter for screening mammogram for malignant neoplasm of breast: Secondary | ICD-10-CM | POA: Diagnosis not present

## 2022-07-09 ENCOUNTER — Ambulatory Visit (HOSPITAL_COMMUNITY)
Admission: EM | Admit: 2022-07-09 | Discharge: 2022-07-09 | Disposition: A | Discharge status: Home / Self Care | Payer: 59 | Attending: Internal Medicine | Admitting: Internal Medicine

## 2022-07-09 ENCOUNTER — Ambulatory Visit: Payer: 59 | Admitting: Family Medicine

## 2022-07-09 ENCOUNTER — Encounter (HOSPITAL_COMMUNITY): Payer: Self-pay

## 2022-07-09 ENCOUNTER — Ambulatory Visit (INDEPENDENT_AMBULATORY_CARE_PROVIDER_SITE_OTHER): Payer: 59

## 2022-07-09 DIAGNOSIS — R109 Unspecified abdominal pain: Secondary | ICD-10-CM | POA: Diagnosis not present

## 2022-07-09 DIAGNOSIS — M545 Low back pain, unspecified: Secondary | ICD-10-CM

## 2022-07-09 LAB — POCT URINALYSIS DIPSTICK, ED / UC
Bilirubin Urine: NEGATIVE
Glucose, UA: NEGATIVE mg/dL
Ketones, ur: NEGATIVE mg/dL
Nitrite: NEGATIVE
Protein, ur: NEGATIVE mg/dL
Specific Gravity, Urine: 1.01 (ref 1.005–1.030)
Urobilinogen, UA: 0.2 mg/dL (ref 0.0–1.0)
pH: 5.5 (ref 5.0–8.0)

## 2022-07-09 MED ORDER — METHYLPREDNISOLONE 4 MG PO TBPK: ORAL_TABLET | ORAL | 0 refills | Status: DC

## 2022-07-09 NOTE — Discharge Instructions (Addendum)
Your urinalysis is positive for leukocytes. A culture has been sent.  Your xray continues to show; Mild-to-moderate L3-4 degenerative disc and endplate changes, similar to prior. The Medrol Dosepak is effective to help with the back pain that you are experiencing. It can also provide you with some increased energy and help to increase your appetite while in use.

## 2022-07-09 NOTE — ED Triage Notes (Signed)
Pt states dx'd and tx'd for UTI on 10/18. States her back pain and fatigue returned 3 days ago and now the pain is constant. Denies urinary sx's or injury.

## 2022-07-09 NOTE — ED Provider Notes (Signed)
[facility]    CSN: [id number] Arrival date & time: 07/09/22  1241      History   Chief Complaint Chief Complaint  Patient presents with   Back Pain    HPI [name] is a 61 y.o. female.   HPI She is in today with low back pain an fatigue. She denies any radiating pain. She was seen in the ED for UTI symptom and treated however her culture was negative for bacteria. She followed up with her PCP and was referred to urology due to her family history. She has not completed this appointment. She is also complaining of fatigue with a decreased appetite. She is just not herself and the previous symptoms have returned.  Denies headache, dizziness, visual changes, shortness of breath, dyspnea on exertion, chest pain, nausea, vomiting or any edema.    Past Medical History:  Diagnosis Date   Anemia    Anxiety    Arrhythmia 2013   Colon polyp 08/28/2012   Tubular adenoma   Dense breast    Depression    Genital warts    GERD (gastroesophageal reflux disease)    Heart murmur    Hemorrhoids    Hx of cardiac catheterization 2009   normal coronary arteries   Hx of menorrhagia    Novasure   Hypokalemia    Hypomagnesemia    Hypophosphatemia    Lactose intolerance in adult    Meniscus degeneration, right    torn r knee/ 10/2019   Mitral valve prolapse    Normal cardiac stress test 03/09/2012   no ischemia   Positive PPD    Rapid heart beat    States "irreg heart beat"   Thyroid disease    Thyroid nodules    Patient Active Problem List   Diagnosis Date Noted   Family history of bladder cancer 07/05/2022   Intermittent gross hematuria 07/05/2022   Recent urinary tract infection 07/05/2022   Weight loss 12/07/2021   Fatigue 12/07/2021   Post viral syndrome 12/07/2021   Chronic sinusitis 08/25/2021   Prediabetes 08/25/2021   Paroxysmal SVT (supraventricular tachycardia) 03/31/2021   Degenerative disc disease, cervical 10/13/2020   Stress  incontinence of urine 10/31/2018   History of diverticulosis 05/04/2017   Malaise 07/05/2013   History of PSVT (paroxysmal supraventricular tachycardia) 06/24/2012   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      Home Medications    Prior to Admission medications   Medication Sig Start Date End Date Taking? Authorizing Provider  methylPREDNISolone (MEDROL) 4 MG TBPK tablet Follow package instructions. 07/09/22 07/15/22 Yes Vevelyn Francois, NP  Ascorbic Acid (VITAMIN C) 1000 MG tablet Take 2,000 mg by mouth at bedtime.    [provider]  aspirin EC 81 MG EC tablet Take 1 tablet (81 mg total) by mouth daily. 10/21/19   Dixie Dials, MD  Calcium Carb-Cholecalciferol (OYSTER SHELL CALCIUM 250+D PO) Take 2 tablets by mouth at bedtime.    [provider]  Cholecalciferol (VITAMIN D) 50 MCG (2000 UT) tablet Take 4,000 Units by mouth at bedtime.     [provider]  cyclobenzaprine (  FLEXERIL) 5 MG tablet Take 1 tablet (5 mg total) by mouth at bedtime as needed for muscle spasms. 07/17/20   Just, Laurita Quint, FNP  losartan (COZAAR) 50 MG tablet Take 25 mg by mouth 2 (two) times daily.    [provider]  metoprolol succinate (TOPROL-XL) 25 MG 24 hr tablet Take 25 mg by mouth 2 (two) times daily.     [provider]  Multiple Vitamins-Minerals (ZINC PO) Take 1 tablet by mouth at bedtime.    [provider]  PARoxetine (PAXIL-CR) 25 MG 24 hr tablet Take 25 mg by mouth daily.    [provider]  Polyethyl Glycol-Propyl Glycol (SYSTANE OP) Place 2 drops into both eyes daily.    [provider]  potassium chloride SA  (KLOR-CON M20) 20 MEQ tablet Take 2 tablets (40 mEq total) by mouth daily. TAKE 2 TABLETS BY MOUTH EVERY DAY 02/02/22 01/28/23  Horald Pollen, MD  TURMERIC PO Take 1 capsule by mouth daily as needed (pain/inflammation).    [provider]    Family History Family History  Problem Relation Age of Onset   Heart disease Father        died Mi 75   Hypertension Maternal Grandfather    Diabetes Maternal Grandfather    Heart disease Maternal Grandfather    Colon cancer Maternal Grandfather    Rectal cancer Maternal Grandfather    Diabetes Maternal Grandmother    Prostate cancer Neg Hx    Breast cancer Neg Hx     Social History Social History   Tobacco Use   Smoking status: Never   Smokeless tobacco: Never  Vaping Use   Vaping Use: Never used  Substance Use Topics   Alcohol use: No   Drug use: No     Allergies   Doxycycline   Review of Systems Review of Systems   Physical Exam Triage Vital Signs ED Triage Vitals  Enc Vitals Group     BP 07/09/22 1405 115/70     Pulse Rate 07/09/22 1405 68     Resp 07/09/22 1405 18     Temp 07/09/22 1405 97.8 F (36.6 C)     Temp Source 07/09/22 1405 Oral     SpO2 07/09/22 1405 100 %     Weight --      Height --      Head Circumference --      Peak Flow --      Pain Score 07/09/22 1406 8     Pain Loc --      Pain Edu? --      Excl. in [place]? --    No data found.  Updated Vital Signs BP 115/70 (BP Location: Left Arm)   Pulse 68   Temp 97.8 F (36.6 C) (Oral)   Resp 18   LMP 09/17/2016 (Approximate)   SpO2 100%   Visual Acuity Right Eye Distance:   Left Eye Distance:   Bilateral Distance:    Right Eye Near:   Left Eye Near:    Bilateral Near:     Physical Exam Constitutional:      Appearance: She is obese.  HENT:     Head: Normocephalic and atraumatic.     Nose: Nose normal.     Mouth/Throat:     Mouth: Mucous membranes are moist.  Cardiovascular:     Rate and Rhythm: Normal rate.  Pulmonary:      Effort: Pulmonary effort is normal.  Musculoskeletal:  General: Normal range of motion.     Cervical back: Normal range of motion.     Lumbar back: Tenderness present. No swelling.  Skin:    General: Skin is warm.     Capillary Refill: Capillary refill takes less than 2 seconds.  Neurological:     General: No focal deficit present.     Mental Status: She is alert and oriented to person, place, and time.      UC Treatments / Results  Labs (all labs ordered are listed, but only abnormal results are displayed) Labs Reviewed  POCT URINALYSIS DIPSTICK, ED / UC - Abnormal; Notable for the following components:      Result Value   Hgb urine dipstick TRACE (*)    Leukocytes,Ua SMALL (*)    All other components within normal limits  URINE CULTURE    EKG   Radiology DG Lumbar Spine Complete  Result Date: 07/09/2022 CLINICAL DATA:  Lower back pain. History of degenerative joint disease. EXAM: LUMBAR SPINE - COMPLETE 4+ VIEW COMPARISON:  Lumbar spine radiographs 05/05/2018 FINDINGS: Normal frontal alignment. No sagittal spondylolisthesis. Vertebral body heights are maintained. Mild posterior L3-4 disc space narrowing and mild to moderate anterior endplate osteophytes, similar to prior. No pars defect is seen. Facet joint arthropathy is greatest at L4-5. IMPRESSION: Mild-to-moderate L3-4 degenerative disc and endplate changes, similar to prior. Electronically Signed   By: Yvonne Kendall M.D.   On: 07/09/2022 15:06   MM 3D SCREEN BREAST BILATERAL  Result Date: 07/09/2022 CLINICAL DATA:  Screening. EXAM: DIGITAL SCREENING BILATERAL MAMMOGRAM WITH TOMOSYNTHESIS AND CAD TECHNIQUE: Bilateral screening digital craniocaudal and mediolateral oblique mammograms were obtained. Bilateral screening digital breast tomosynthesis was performed. The images were evaluated with computer-aided detection. COMPARISON:  Previous exam(s). ACR Breast Density Category c: The breast tissue is  heterogeneously dense, which may obscure small masses. FINDINGS: There are no findings suspicious for malignancy. IMPRESSION: No mammographic evidence of malignancy. A result letter of this screening mammogram will be mailed directly to the patient. RECOMMENDATION: Screening mammogram in one year. (Code:SM-B-01Y) BI-RADS CATEGORY  1: Negative. Electronically Signed   By: Claudie Revering M.D.   On: 07/09/2022 13:08    Procedures Procedures (including critical care time)  Medications Ordered in UC Medications - No data to display  Initial Impression / Assessment and Plan / UC Course  I have reviewed the triage vital signs and the nursing notes.  Pertinent labs & imaging results that were available during my care of the patient were reviewed by me and considered in my medical decision making (see chart for details).     Low back pain Final Clinical Impressions(s) / UC Diagnoses   Final diagnoses:  Acute midline low back pain without sciatica  Right flank pain     Discharge Instructions      Your urinalysis is positive for leukocytes. A culture has been sent.  Your xray continues to show; Mild-to-moderate L3-4 degenerative disc and endplate changes, similar to prior. The Medrol Dosepak is effective to help with the back pain that you are experiencing. It can also provide you with some increased energy and help to increase your appetite while in use.       ED Prescriptions     Medication Sig Dispense Auth. Provider   methylPREDNISolone (MEDROL) 4 MG TBPK tablet Follow package instructions. 21 tablet Vevelyn Francois, NP      PDMP not reviewed this encounter.   Dionisio David [place], [place] 07/09/22 [phone]

## 2022-07-11 LAB — URINE CULTURE: Culture: NO GROWTH

## 2022-07-12 ENCOUNTER — Telehealth: Payer: Self-pay | Admitting: Emergency Medicine

## 2022-07-12 ENCOUNTER — Encounter: Payer: 59 | Admitting: Family Medicine

## 2022-07-12 NOTE — Telephone Encounter (Signed)
Pt was a no show for a NP app, she says her insu comp told her Dr. Ethelene Hal was a cardiologist, I sent her a letter.

## 2022-07-14 ENCOUNTER — Ambulatory Visit (HOSPITAL_COMMUNITY)
Admission: EM | Admit: 2022-07-14 | Discharge: 2022-07-14 | Disposition: A | Discharge status: Home / Self Care | Payer: 59 | Attending: Family Medicine | Admitting: Family Medicine

## 2022-07-14 ENCOUNTER — Encounter (HOSPITAL_COMMUNITY): Payer: Self-pay | Admitting: Emergency Medicine

## 2022-07-14 DIAGNOSIS — R5383 Other fatigue: Secondary | ICD-10-CM | POA: Diagnosis not present

## 2022-07-14 LAB — BASIC METABOLIC PANEL
Anion gap: 9 (ref 5–15)
BUN: 12 mg/dL (ref 8–23)
CO2: 27 mmol/L (ref 22–32)
Calcium: 9.3 mg/dL (ref 8.9–10.3)
Chloride: 108 mmol/L (ref 98–111)
Creatinine, Ser: 0.79 mg/dL (ref 0.44–1.00)
GFR, Estimated: >60 mL/min (ref >60)
Glucose, Bld: 102 mg/dL — ABNORMAL HIGH (ref 70–99)
Potassium: 3.7 mmol/L (ref 3.5–5.1)
Sodium: 144 mmol/L (ref 135–145)

## 2022-07-14 LAB — POCT URINALYSIS DIPSTICK, ED / UC
Bilirubin Urine: NEGATIVE
Glucose, UA: NEGATIVE mg/dL
Hgb urine dipstick: NEGATIVE
Ketones, ur: NEGATIVE mg/dL
Nitrite: NEGATIVE
Protein, ur: NEGATIVE mg/dL
Specific Gravity, Urine: 1.01 (ref 1.005–1.030)
Urobilinogen, UA: 0.2 mg/dL (ref 0.0–1.0)
pH: 5.5 (ref 5.0–8.0)

## 2022-07-14 LAB — T4, FREE: Free T4: 0.57 ng/dL — ABNORMAL LOW (ref 0.61–1.12)

## 2022-07-14 LAB — TSH: TSH: 0.963 u[IU]/mL (ref 0.350–4.500)

## 2022-07-14 NOTE — Telephone Encounter (Signed)
Fee waived. Pt did not reschedule.

## 2022-07-14 NOTE — ED Notes (Addendum)
Patient requesting MD provider.  Dr Mannie Stabile to see patient.  Patient is aware of delay in being seen due to request

## 2022-07-14 NOTE — ED Triage Notes (Signed)
Patient was seen on 10/27 for a uti that started 10/18.  Patient says by the end of the day she is so fatigue, having chills.  Denies urinary symptoms.

## 2022-07-14 NOTE — Discharge Instructions (Signed)
You have had labs (blood work) drawn today. We will call you with any significant abnormalities or if there is need to begin or change treatment or pursue further follow up.  You may also review your test results online through MyChart. If you do not have a MyChart account, instructions to sign up should be on your discharge paperwork.  

## 2022-07-15 ENCOUNTER — Ambulatory Visit: Payer: 59 | Admitting: Internal Medicine

## 2022-07-15 VITALS — BP 108/66 | HR 67 | Temp 98.2°F | Ht 66.0 in | Wt 195.0 lb

## 2022-07-15 DIAGNOSIS — R7303 Prediabetes: Secondary | ICD-10-CM

## 2022-07-15 DIAGNOSIS — G4730 Sleep apnea, unspecified: Secondary | ICD-10-CM

## 2022-07-15 DIAGNOSIS — R5383 Other fatigue: Secondary | ICD-10-CM

## 2022-07-15 DIAGNOSIS — G4710 Hypersomnia, unspecified: Secondary | ICD-10-CM

## 2022-07-15 LAB — URINE CULTURE: Culture: <10000 — AB

## 2022-07-15 NOTE — Patient Instructions (Signed)
You will be contacted regarding the referral for: Pulmonary - Dr Annamaria Boots  Please continue all other medications as before, and refills have been done if requested.  Please have the pharmacy call with any other refills you may need.  Please continue your efforts at being more active, low cholesterol diet, and weight control.  Please keep your appointments with your specialists as you may have planned

## 2022-07-15 NOTE — Progress Notes (Signed)
Patient ID: [name], female   DOB: 10-23-1960, 61 y.o.   MRN: [id number]        Chief Complaint: follow up exhaustion and fatigue,        HPI:  [name] is a 61 y.o. female here with hx of OSA taken off tx 10 years ago, now with start oct 18 persistent daytime exhaustion and fatigue, sleepiness, and admits she has been told she has terrible snoring recently.  Pt denies chest pain, increased sob or doe, wheezing, orthopnea, PND, increased LE swelling, palpitations, dizziness or syncope.   Pt denies polydipsia, polyuria, or new focal neuro s/s.    Pt denies fever, wt loss, night sweats, loss of appetite, or other constitutional symptoms  Not taking losartan bc seems to make her feel worse. Finished antibx for recent UTI - Denies urinary symptoms such as dysuria, frequency, urgency, flank pain, hematuria or n/v, fever, chills.   Had slightly low free t4 with normal TSH on recent albs.  Denies worsening depressive symptoms, suicidal ideation, or panic       Wt Readings from Last 3 Encounters:  07/15/22 195 lb (88.5 kg)  07/05/22 198 lb 6 oz (90 kg)  06/30/22 194 lb (88 kg)   BP Readings from Last 3 Encounters:  07/15/22 108/66  07/14/22 107/68  07/09/22 115/70         Past Medical History:  Diagnosis Date   Anemia    Anxiety    Arrhythmia 2013   Colon polyp 08/28/2012   Tubular adenoma   Dense breast    Depression    Genital warts    GERD (gastroesophageal reflux disease)    Heart murmur    Hemorrhoids    Hx of cardiac catheterization 2009   normal coronary arteries   Hx of menorrhagia    Novasure   Hypokalemia    Hypomagnesemia    Hypophosphatemia    Lactose intolerance in adult    Meniscus degeneration, right    torn r knee/ 10/2019   Mitral valve prolapse    Normal cardiac stress test 03/09/2012   no ischemia   Positive PPD    Rapid heart beat    States "irreg heart beat"   Thyroid disease    Thyroid nodules   Past Surgical History:   Procedure Laterality Date   CARDIAC CATHETERIZATION     x2 with normal results per pt   [facility]  May 2013    reports that she has never smoked. She has never used smokeless tobacco. She reports that she does not drink alcohol and does not use drugs. family history includes Colon cancer in her maternal grandfather; Diabetes in her maternal grandfather and maternal grandmother; Heart disease in her father and maternal grandfather; Hypertension in her maternal grandfather; Rectal cancer in her maternal grandfather. Allergies  Allergen Reactions   Doxycycline Rash   Current Outpatient Medications on File Prior to Visit  Medication Sig Dispense Refill   Ascorbic Acid (VITAMIN C) 1000 MG tablet Take 2,000 mg by mouth at bedtime.     aspirin EC 81 MG EC tablet Take 1 tablet (81 mg total) by mouth daily.     Calcium Carb-Cholecalciferol (OYSTER SHELL CALCIUM 250+D PO) Take 2 tablets by mouth at bedtime.     Cholecalciferol (VITAMIN D) 50 MCG (2000 UT) tablet Take 4,000 Units by mouth at bedtime.      losartan (COZAAR) 50 MG tablet Take 25 mg  by mouth 2 (two) times daily.     metoprolol succinate (TOPROL-XL) 25 MG 24 hr tablet Take 25 mg by mouth 2 (two) times daily.      metoprolol tartrate (LOPRESSOR) 25 MG tablet Take 25 mg by mouth 2 (two) times daily.     Multiple Vitamins-Minerals (ZINC PO) Take 1 tablet by mouth at bedtime.     PARoxetine (PAXIL-CR) 25 MG 24 hr tablet Take 25 mg by mouth daily.     Polyethyl Glycol-Propyl Glycol (SYSTANE OP) Place 2 drops into both eyes daily.     potassium chloride SA (KLOR-CON M20) 20 MEQ tablet Take 2 tablets (40 mEq total) by mouth daily. TAKE 2 TABLETS BY MOUTH EVERY DAY 180 tablet 3   TURMERIC PO Take 1 capsule by mouth daily as needed (pain/inflammation).     No current facility-administered medications on file prior to visit.        ROS:  All others reviewed and negative.  Objective        PE:   BP 108/66 (BP Location: Right Arm, Patient Position: Sitting, Cuff Size: Large)   Pulse 67   Temp 98.2 F (36.8 C) (Oral)   Ht '5\' 6"'$  (1.676 m)   Wt 195 lb (88.5 kg)   LMP 09/17/2016 (Approximate)   SpO2 95%   BMI 31.47 kg/m                 Constitutional: Pt appears in NAD               HENT: Head: NCAT.                Right Ear: External ear normal.                 Left Ear: External ear normal.                Eyes: . Pupils are equal, round, and reactive to light. Conjunctivae and EOM are normal               Nose: without d/c or deformity               Neck: Neck supple. Gross normal ROM               Cardiovascular: Normal rate and regular rhythm.                 Pulmonary/Chest: Effort normal and breath sounds without rales or wheezing.                Abd:  Soft, NT, ND, + BS, no organomegaly               Neurological: Pt is alert. At baseline orientation, motor grossly intact               Skin: Skin is warm. No rashes, no other new lesions, LE edema - none               Psychiatric: Pt behavior is normal without agitation   Micro: none  Cardiac tracings I have personally interpreted today:  none  Pertinent Radiological findings (summarize): none   Lab Results  Component Value Date   WBC 4.7 06/30/2022   HGB 13.0 06/30/2022   HCT 41.4 06/30/2022   PLT 210 06/30/2022   GLUCOSE 102 (H) 07/14/2022   CHOL 137 08/25/2021   TRIG 141.0 08/25/2021   HDL 46.50 08/25/2021   LDLCALC 63 08/25/2021   ALT 21 12/07/2021  AST 22 12/07/2021   NA 144 07/14/2022   K 3.7 07/14/2022   CL 108 07/14/2022   CREATININE 0.79 07/14/2022   BUN 12 07/14/2022   CO2 27 07/14/2022   TSH 0.963 07/14/2022   INR 1.0 05/21/2021   HGBA1C 6.3 12/07/2021   Assessment/Plan:  [name] is a 61 y.o. Black or African American [2] female with  has a past medical history of Anemia, Anxiety, Arrhythmia (2013), Colon polyp (08/28/2012), Dense breast, Depression, Genital warts, GERD  (gastroesophageal reflux disease), Heart murmur, Hemorrhoids, cardiac catheterization (2009), menorrhagia, Hypokalemia, Hypomagnesemia, Hypophosphatemia, Lactose intolerance in adult, Meniscus degeneration, right, Mitral valve prolapse, Normal cardiac stress test (03/09/2012), Positive PPD, Rapid heart beat, and Thyroid disease.  Sleep apnea Overall seems most likely explanation for symptoms - for refer pulmonary Dr Annamaria Boots whom she has seen in the past  Prediabetes Lab Results  Component Value Date   HGBA1C 6.3 12/07/2021   Stable, pt to continue current medical treatment - diet, wt control, excercise   Fatigue Doubtful the slightly low free t4 is clinically significant, recent labs reviewed with pt,  to f/u any worsening symptoms or concerns  Followup: Return if symptoms worsen or fail to improve.  Cathlean Cower, MD 07/17/2022 9:14 PM [facility]

## 2022-07-17 ENCOUNTER — Encounter: Payer: Self-pay | Admitting: Internal Medicine

## 2022-07-17 NOTE — Assessment & Plan Note (Signed)
Doubtful the slightly low free t4 is clinically significant, recent labs reviewed with pt,  to f/u any worsening symptoms or concerns

## 2022-07-17 NOTE — Assessment & Plan Note (Signed)
Lab Results  Component Value Date   HGBA1C 6.3 12/07/2021   Stable, pt to continue current medical treatment - diet, wt control, excercise

## 2022-07-17 NOTE — Assessment & Plan Note (Signed)
Overall seems most likely explanation for symptoms - for refer pulmonary Dr Annamaria Boots whom she has seen in the past

## 2022-07-17 NOTE — ED Provider Notes (Signed)
[place]   [id number] 07/14/22 Arrival Time: [place]:  1. Other fatigue    Unclear etiology of fatigue. Possible recent viral illness and recovery. No significant lab abnormalities to suggest specific cause. She plans to call PCP for f/u.  Labs Reviewed  URINE CULTURE - Abnormal; Notable for the following components:      Result Value   Culture   (*)    Value: <10,000 COLONIES/mL INSIGNIFICANT GROWTH Performed at [facility], [place] [address], [place], [place] [address]    All other components within normal limits  BASIC METABOLIC PANEL - Abnormal; Notable for the following components:   Glucose, Bld 102 (*)    All other components within normal limits  T4, FREE - Abnormal; Notable for the following components:   Free T4 0.57 (*)    All other components within normal limits  POCT URINALYSIS DIPSTICK, ED / UC - Abnormal; Notable for the following components:   Leukocytes,Ua SMALL (*)    All other components within normal limits  TSH     Follow-up Information     Schedule an appointment as soon as possible for a visit  with Horald Pollen, MD.   Specialty: Internal Medicine Contact information: [place] [address] [phone]                 Reviewed expectations re: course of current medical issues. Questions answered. Outlined signs and symptoms indicating need for more acute intervention. Understanding verbalized. After Visit Summary given.   SUBJECTIVE: History from: Patient. [name] is a 61 y.o. female. Complains of fatigue. Visit note from 10/27  reviewed. Treated in ED recently for potential UTI; appears culture was negative. Still with fatigue and weakness; more at end of day. No other specific symptoms. Slightly decreased appetitive. No abd/back/chest pain. No resp difficulties. Without n/v. Normal ambulation. No extremity sensation changes or weakness. No specific headaches.  Weight is stable.  OBJECTIVE:  Vitals:   07/14/22 1924  BP: 107/68  Pulse: 74  Resp: 20  Temp: 98 F (36.7 C)  TempSrc: Oral  SpO2: 98%    General appearance: alert; no distress Eyes: PERRLA; EOMI; conjunctiva normal HENT: [place]; AT; without nasal congestion Neck: supple  CV: RRR Lungs: speaks full sentences without difficulty; unlabored Extremities: no edema Skin: warm and dry Neurologic: normal gait; CN 2-12 grossly intact; normal extremity movements and strength Psychological: alert and cooperative; normal mood and affect  Labs: Results for orders placed or performed during the hospital encounter of 07/14/22  Urine Culture   Specimen: Urine, Clean Catch  Result Value Ref Range   Specimen Description URINE, CLEAN CATCH    Special Requests NONE    Culture (A)     <10,000 COLONIES/mL INSIGNIFICANT GROWTH Performed at [facility], [address], [place],  [address]    Report Status 07/15/2022 FINAL   Basic metabolic panel  Result Value Ref Range   Sodium 144 135 - 145 mmol/L   Potassium 3.7 3.5 - 5.1 mmol/L   Chloride 108 98 - 111 mmol/L   CO2 27 22 - 32 mmol/L   Glucose, Bld 102 (H) 70 - 99 mg/dL   BUN 12 8 - 23 mg/dL   Creatinine, Ser 0.79 0.44 - 1.00 mg/dL   Calcium 9.3 8.9 - 10.3 mg/dL   GFR, Estimated >60 >60 mL/min   Anion gap 9 5 - 15  TSH  Result Value Ref Range  TSH 0.963 0.350 - 4.500 uIU/mL  T4, free  Result Value Ref Range   Free T4 0.57 (L) 0.61 - 1.12 ng/dL  POC Urinalysis dipstick  Result Value Ref Range   Glucose, UA NEGATIVE NEGATIVE mg/dL   Bilirubin Urine NEGATIVE NEGATIVE   Ketones, ur NEGATIVE NEGATIVE mg/dL   Specific Gravity, Urine 1.010 1.005 - 1.030   Hgb urine dipstick NEGATIVE NEGATIVE   pH 5.5 5.0 - 8.0   Protein, ur NEGATIVE NEGATIVE mg/dL   Urobilinogen, UA 0.2 0.0 - 1.0 mg/dL   Nitrite NEGATIVE NEGATIVE   Leukocytes,Ua SMALL (A) NEGATIVE   Labs Reviewed  URINE CULTURE - Abnormal; Notable for the following  components:      Result Value   Culture   (*)    Value: <10,000 COLONIES/mL INSIGNIFICANT GROWTH Performed at [facility] [address], [place], [place] [address]    All other components within normal limits  BASIC METABOLIC PANEL - Abnormal; Notable for the following components:   Glucose, Bld 102 (*)    All other components within normal limits  T4, FREE - Abnormal; Notable for the following components:   Free T4 0.57 (*)    All other components within normal limits  POCT URINALYSIS DIPSTICK, ED / UC - Abnormal; Notable for the following components:   Leukocytes,Ua SMALL (*)    All other components within normal limits  TSH     Allergies  Allergen Reactions   Doxycycline Rash    Past Medical History:  Diagnosis Date   Anemia    Anxiety    Arrhythmia 2013   Colon polyp 08/28/2012   Tubular adenoma   Dense breast    Depression    Genital warts    GERD (gastroesophageal reflux disease)    Heart murmur    Hemorrhoids    Hx of cardiac catheterization 2009   normal coronary arteries   Hx of menorrhagia    Novasure   Hypokalemia    Hypomagnesemia    Hypophosphatemia    Lactose intolerance in adult    Meniscus degeneration, right    torn r knee/ 10/2019   Mitral valve prolapse    Normal cardiac stress test 03/09/2012   no ischemia   Positive PPD    Rapid heart beat    States "irreg heart beat"   Thyroid disease    Thyroid nodules   Social History   Socioeconomic History   Marital status: Divorced    Spouse name: Not on file   Number of children: 2   Years of education: 16   Highest education level: Not on file  Occupational History   Occupation: dialysis Engineer, production: [employer]  Tobacco Use   Smoking status: Never   Smokeless tobacco: Never  Vaping Use   Vaping Use: Never used  Substance and Sexual Activity   Alcohol use: No   Drug use: No   Sexual activity: Yes  Other Topics Concern   Not on file  Social History  Narrative   [name] is a divorced [place] American female who works as a Engineer, 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 She lived in [place] and in [place] for a number of years her mom is from [place]. 16 years of education went to college at [employer] lives at home with her son who is in his 29s no pets   Neg ets tob etoh hx PA  6 hours of sleep   G2P2    TD2010  colonoscopy 2009   Now running  own business    HH of 2    Left handed    Caffeine use: tea sometimes         Social Determinants of Health   Financial Resource Strain: Not on file  Food Insecurity: Not on file  Transportation Needs: Not on file  Physical Activity: Not on file  Stress: Not on file  Social Connections: Not on file  Intimate Partner Violence: Not on file   Family History  Problem Relation Age of Onset   Heart disease Father        died Mi 34   Hypertension Maternal Grandfather    Diabetes Maternal Grandfather    Heart disease Maternal Grandfather    Colon cancer Maternal Grandfather    Rectal cancer Maternal Grandfather    Diabetes Maternal Grandmother    Prostate cancer Neg Hx    Breast cancer Neg Hx    Past Surgical History:  Procedure Laterality Date   CARDIAC CATHETERIZATION     x2 with normal results per pt   [facility] ENDOSCOPY  May 2013     Vanessa Kick, MD 07/17/22 [phone]

## 2022-07-22 DIAGNOSIS — F4310 Post-traumatic stress disorder, unspecified: Secondary | ICD-10-CM | POA: Diagnosis not present

## 2022-07-22 DIAGNOSIS — R69 Illness, unspecified: Secondary | ICD-10-CM | POA: Diagnosis not present

## 2022-07-22 DIAGNOSIS — F411 Generalized anxiety disorder: Secondary | ICD-10-CM | POA: Diagnosis not present

## 2022-07-28 ENCOUNTER — Encounter: Payer: Self-pay | Admitting: Primary Care

## 2022-07-28 ENCOUNTER — Ambulatory Visit (INDEPENDENT_AMBULATORY_CARE_PROVIDER_SITE_OTHER): Payer: 59 | Admitting: Primary Care

## 2022-07-28 VITALS — BP 120/70 | HR 60 | Ht 66.0 in | Wt 199.4 lb

## 2022-07-28 DIAGNOSIS — G4730 Sleep apnea, unspecified: Secondary | ICD-10-CM | POA: Diagnosis not present

## 2022-07-28 NOTE — Progress Notes (Signed)
$'@Patient'f$  ID: [name], female    DOB: [birth year], 61 y.o.   MRN: [id number]  Chief Complaint  Patient presents with   Consult    Possible sleep apnea, fatigue, snoring    Referring provider: Biagio Borg, MD  HPI: 61 year old female, never smoked.  Past medical history significant for X asthma supraventricular tachycardia, sleep apnea, chronic sinusitis, IBS, multinodular goiter..  07/28/2022 Patient presents today for sleep consult.  She has symptoms of snoring and disrupted sleep. She reports waking up frequently throughout the night. Associated daytime sleepiness.  She had a sleep study in early 2000s, she tells me she had sleep apnea but had a hard time tolerating CPAP. Results are not available in chart .Typical bedtime is 11 PM.  It takes her 15 minutes to fall asleep.  She wakes up on average 3 times a night.  She starts her day at 8:30 AM.  Her weight is down 10 pounds.  She does not wear CPAP or oxygen.  Epworth score 4.  Denies symptoms of narcolepsy, cataplexy or sleep walking.    Allergies  Allergen Reactions   Doxycycline Rash    Immunization History  Administered Date(s) Administered   Influenza,inj,Quad PF,6+ Mos 06/13/2014   PFIZER(Purple Top)SARS-COV-2 Vaccination 02/26/2020, 03/25/2020   Tdap 09/14/2007, 07/17/2020    Past Medical History:  Diagnosis Date   Anemia    Anxiety    Arrhythmia 2013   Colon polyp 08/28/2012   Tubular adenoma   Dense breast    Depression    Genital warts    GERD (gastroesophageal reflux disease)    Heart murmur    Hemorrhoids    Hx of cardiac catheterization 2009   normal coronary arteries   Hx of menorrhagia    Novasure   Hypokalemia    Hypomagnesemia    Hypophosphatemia    Lactose intolerance in adult    Meniscus degeneration, right    torn r knee/ 10/2019   Mitral valve prolapse    Normal cardiac stress test 03/09/2012   no ischemia   Positive PPD    Rapid heart beat    States "irreg heart beat"    Thyroid disease    Thyroid nodules    Tobacco History: Social History   Tobacco Use  Smoking Status Never  Smokeless Tobacco Never   Counseling given: Not Answered   Outpatient Medications Prior to Visit  Medication Sig Dispense Refill   Ascorbic Acid (VITAMIN C) 1000 MG tablet Take 2,000 mg by mouth at bedtime.     aspirin EC 81 MG EC tablet Take 1 tablet (81 mg total) by mouth daily.     Calcium Carb-Cholecalciferol (OYSTER SHELL CALCIUM 250+D PO) Take 2 tablets by mouth at bedtime.     Cholecalciferol (VITAMIN D) 50 MCG (2000 UT) tablet Take 4,000 Units by mouth at bedtime.      losartan (COZAAR) 50 MG tablet Take 25 mg by mouth 2 (two) times daily.     metoprolol succinate (TOPROL-XL) 25 MG 24 hr tablet Take 25 mg by mouth 2 (two) times daily.      metoprolol tartrate (LOPRESSOR) 25 MG tablet Take 25 mg by mouth 2 (two) times daily.     Multiple Vitamins-Minerals (ZINC PO) Take 1 tablet by mouth at bedtime.     PARoxetine (PAXIL-CR) 25 MG 24 hr tablet Take 25 mg by mouth daily.     Polyethyl Glycol-Propyl Glycol (SYSTANE OP) Place 2 drops into both eyes daily.  potassium chloride SA (KLOR-CON M20) 20 MEQ tablet Take 2 tablets (40 mEq total) by mouth daily. TAKE 2 TABLETS BY MOUTH EVERY DAY 180 tablet 3   TURMERIC PO Take 1 capsule by mouth daily as needed (pain/inflammation).     No facility-administered medications prior to visit.    Review of Systems  Review of Systems  Constitutional:  Positive for fatigue.  HENT: Negative.    Respiratory: Negative.    Cardiovascular: Negative.   Psychiatric/Behavioral:  Positive for sleep disturbance.     Physical Exam  BP 120/70 (BP Location: Right Arm)   Pulse 60   Ht '5\' 6"'$  (1.676 m)   Wt 199 lb 6.4 oz (90.4 kg)   LMP 09/17/2016 (Approximate)   SpO2 99%   BMI 32.18 kg/m  Physical Exam Constitutional:      Appearance: Normal appearance.  HENT:     Head: Normocephalic and atraumatic.  Cardiovascular:     Rate  and Rhythm: Normal rate and regular rhythm.  Pulmonary:     Effort: Pulmonary effort is normal.     Breath sounds: Normal breath sounds.  Musculoskeletal:        General: Normal range of motion.  Skin:    General: Skin is warm and dry.  Neurological:     General: No focal deficit present.     Mental Status: She is alert and oriented to person, place, and time. Mental status is at baseline.  Psychiatric:        Mood and Affect: Mood normal.        Behavior: Behavior normal.        Thought Content: Thought content normal.        Judgment: Judgment normal.      Lab Results:  CBC    Component Value Date/Time   WBC 4.7 06/30/2022 1215   RBC 5.49 (H) 06/30/2022 1215   HGB 13.0 06/30/2022 1215   HGB 13.4 05/25/2018 1015   HCT 41.4 06/30/2022 1215   HCT 43.0 05/25/2018 1015   PLT 210 06/30/2022 1215   PLT 228 05/25/2018 1015   MCV 75.4 (L) 06/30/2022 1215   MCV 76.4 01/17/2020 1708   MCV 77 (L) 05/25/2018 1015   MCH 23.7 (L) 06/30/2022 1215   MCHC 31.4 06/30/2022 1215   RDW 14.7 06/30/2022 1215   RDW 14.7 05/25/2018 1015   LYMPHSABS 1.7 12/07/2021 1501   LYMPHSABS 1.2 05/25/2018 1015   MONOABS 0.8 12/07/2021 1501   EOSABS 0.1 12/07/2021 1501   EOSABS 0.1 05/25/2018 1015   BASOSABS 0.0 12/07/2021 1501   BASOSABS 0.0 05/25/2018 1015    BMET    Component Value Date/Time   NA 144 07/14/2022 1940   NA 142 09/23/2020 1113   K 3.7 07/14/2022 1940   CL 108 07/14/2022 1940   CO2 27 07/14/2022 1940   GLUCOSE 102 (H) 07/14/2022 1940   BUN 12 07/14/2022 1940   BUN 10 09/23/2020 1113   CREATININE 0.79 07/14/2022 1940   CALCIUM 9.3 07/14/2022 1940   CALCIUM 9.1 07/04/2012 1420   GFRNONAA >60 07/14/2022 1940   GFRAA 93 09/23/2020 1113    BNP    Component Value Date/Time   BNP 12.5 07/25/2021 2025    ProBNP    Component Value Date/Time   PROBNP 14.3 01/21/2013 2230    Imaging: DG Lumbar Spine Complete  Result Date: 07/09/2022 CLINICAL DATA:  Lower back pain.  History of degenerative joint disease. EXAM: LUMBAR SPINE - COMPLETE 4+ VIEW COMPARISON:  Lumbar  spine radiographs 05/05/2018 FINDINGS: Normal frontal alignment. No sagittal spondylolisthesis. Vertebral body heights are maintained. Mild posterior L3-4 disc space narrowing and mild to moderate anterior endplate osteophytes, similar to prior. No pars defect is seen. Facet joint arthropathy is greatest at L4-5. IMPRESSION: Mild-to-moderate L3-4 degenerative disc and endplate changes, similar to prior. Electronically Signed   By: Yvonne Kendall M.D.   On: 07/09/2022 15:06   MM 3D SCREEN BREAST BILATERAL  Result Date: 07/09/2022 CLINICAL DATA:  Screening. EXAM: DIGITAL SCREENING BILATERAL MAMMOGRAM WITH TOMOSYNTHESIS AND CAD TECHNIQUE: Bilateral screening digital craniocaudal and mediolateral oblique mammograms were obtained. Bilateral screening digital breast tomosynthesis was performed. The images were evaluated with computer-aided detection. COMPARISON:  Previous exam(s). ACR Breast Density Category c: The breast tissue is heterogeneously dense, which may obscure small masses. FINDINGS: There are no findings suspicious for malignancy. IMPRESSION: No mammographic evidence of malignancy. A result letter of this screening mammogram will be mailed directly to the patient. RECOMMENDATION: Screening mammogram in one year. (Code:SM-B-01Y) BI-RADS CATEGORY  1: Negative. Electronically Signed   By: Claudie Revering M.D.   On: 07/09/2022 13:08     Assessment & Plan:   Sleep apnea - Hx sleep apnea. Previously on CPAP but no longer using. She reports fragmented sleep, frequent nocturnal awakenings and daytime sleepiness. She has been told that she snores. Epworth score 4. Discussed risks of untreated sleep apnea including cardiac arrhythmias, pum HTN, stroke and DM. We reviewed treatment options including weight loss, oral appliance, CPAP therapy or referral to ENT for possible surgical options. Needs split night sleep  study in lab to re-assess degree of OSA and titrate CPAP settings.    Martyn Ehrich, NP 08/01/2022

## 2022-07-28 NOTE — Patient Instructions (Addendum)
Concern you have sleep apnea d/t snoring symptoms, fragmented sleep and daytime sleepiness We need to get updated sleep study to evaluate for OSA and severity Treatment options include weight loss, oral appliance, CPAP therapy or referral to ENT for possible surgical therapy   Orders: In-labs split night sleep study   Follow-up: Please schedule visit with me 1-2 weeks after sleep study to review results and treatment options (can be virtual)   Sleep Apnea Sleep apnea is a condition in which breathing pauses or becomes shallow during sleep. People with sleep apnea usually snore loudly. They may have times when they gasp and stop breathing for 10 seconds or more during sleep. This may happen many times during the night. Sleep apnea disrupts your sleep and keeps your body from getting the rest that it needs. This condition can increase your risk of certain health problems, including: Heart attack. Stroke. Obesity. Type 2 diabetes. Heart failure. Irregular heartbeat. High blood pressure. The goal of treatment is to help you breathe normally again. What are the causes?  The most common cause of sleep apnea is a collapsed or blocked airway. There are three kinds of sleep apnea: Obstructive sleep apnea. This kind is caused by a blocked or collapsed airway. Central sleep apnea. This kind happens when the part of the brain that controls breathing does not send the correct signals to the muscles that control breathing. Mixed sleep apnea. This is a combination of obstructive and central sleep apnea. What increases the risk? You are more likely to develop this condition if you: Are overweight. Smoke. Have a smaller than normal airway. Are older. Are female. Drink alcohol. Take sedatives or tranquilizers. Have a family history of sleep apnea. Have a tongue or tonsils that are larger than normal. What are the signs or symptoms? Symptoms of this condition include: Trouble staying  asleep. Loud snoring. Morning headaches. Waking up gasping. Dry mouth or sore throat in the morning. Daytime sleepiness and tiredness. If you have daytime fatigue because of sleep apnea, you may be more likely to have: Trouble concentrating. Forgetfulness. Irritability or mood swings. Personality changes. Feelings of depression. Sexual dysfunction. This may include loss of interest if you are female, or erectile dysfunction if you are female. How is this diagnosed? This condition may be diagnosed with: A medical history. A physical exam. A series of tests that are done while you are sleeping (sleep study). These tests are usually done in a sleep lab, but they may also be done at home. How is this treated? Treatment for this condition aims to restore normal breathing and to ease symptoms during sleep. It may involve managing health issues that can affect breathing, such as high blood pressure or obesity. Treatment may include: Sleeping on your side. Using a decongestant if you have nasal congestion. Avoiding the use of depressants, including alcohol, sedatives, and narcotics. Losing weight if you are overweight. Making changes to your diet. Quitting smoking. Using a device to open your airway while you sleep, such as: An oral appliance. This is a custom-made mouthpiece that shifts your lower jaw forward. A continuous positive airway pressure (CPAP) device. This device blows air through a mask when you breathe out (exhale). A nasal expiratory positive airway pressure (EPAP) device. This device has valves that you put into each nostril. A bi-level positive airway pressure (BIPAP) device. This device blows air through a mask when you breathe in (inhale) and breathe out (exhale). Having surgery if other treatments do not work. During surgery,  excess tissue is removed to create a wider airway. Follow these instructions at home: Lifestyle Make any lifestyle changes that your health care  provider recommends. Eat a healthy, well-balanced diet. Take steps to lose weight if you are overweight. Avoid using depressants, including alcohol, sedatives, and narcotics. Do not use any products that contain nicotine or tobacco. These products include cigarettes, chewing tobacco, and vaping devices, such as e-cigarettes. If you need help quitting, ask your health care provider. General instructions Take over-the-counter and prescription medicines only as told by your health care provider. If you were given a device to open your airway while you sleep, use it only as told by your health care provider. If you are having surgery, make sure to tell your health care provider you have sleep apnea. You may need to bring your device with you. Keep all follow-up visits. This is important. Contact a health care provider if: The device that you received to open your airway during sleep is uncomfortable or does not seem to be working. Your symptoms do not improve. Your symptoms get worse. Get help right away if: You develop: Chest pain. Shortness of breath. Discomfort in your back, arms, or stomach. You have: Trouble speaking. Weakness on one side of your body. Drooping in your face. These symptoms may represent a serious problem that is an emergency. Do not wait to see if the symptoms will go away. Get medical help right away. Call your local emergency services (911 in the [place] Do not drive yourself to the hospital. Summary Sleep apnea is a condition in which breathing pauses or becomes shallow during sleep. The most common cause is a collapsed or blocked airway. The goal of treatment is to restore normal breathing and to ease symptoms during sleep. This information is not intended to replace advice given to you by your health care provider. Make sure you discuss any questions you have with your health care provider. Document Revised: 04/08/2021 Document Reviewed: 08/08/2020 Elsevier Patient  Education  [name].

## 2022-08-01 NOTE — Assessment & Plan Note (Addendum)
-   Hx sleep apnea. Previously on CPAP but no longer using. She reports fragmented sleep, frequent nocturnal awakenings and daytime sleepiness. She has been told that she snores. Epworth score 4. Discussed risks of untreated sleep apnea including cardiac arrhythmias, pum HTN, stroke and DM. We reviewed treatment options including weight loss, oral appliance, CPAP therapy or referral to ENT for possible surgical options. Needs split night sleep study in lab to re-assess degree of OSA and titrate CPAP settings.

## 2022-08-03 NOTE — Progress Notes (Signed)
Reviewed and agree with assessment/plan.   Chesley Mires, MD [facility] Pulmonary/Critical Care 08/03/2022, 1:30 PM Pager:  [phone]

## 2022-08-16 DIAGNOSIS — R8281 Pyuria: Secondary | ICD-10-CM | POA: Diagnosis not present

## 2022-08-24 ENCOUNTER — Emergency Department (HOSPITAL_COMMUNITY)
Admission: EM | Admit: 2022-08-24 | Discharge: 2022-08-25 | Disposition: A | Discharge status: Home / Self Care | Payer: 59 | Attending: Emergency Medicine | Admitting: Emergency Medicine

## 2022-08-24 ENCOUNTER — Encounter (HOSPITAL_COMMUNITY): Payer: Self-pay

## 2022-08-24 ENCOUNTER — Other Ambulatory Visit: Payer: Self-pay

## 2022-08-24 DIAGNOSIS — R112 Nausea with vomiting, unspecified: Secondary | ICD-10-CM

## 2022-08-24 DIAGNOSIS — A084 Viral intestinal infection, unspecified: Secondary | ICD-10-CM | POA: Diagnosis not present

## 2022-08-24 DIAGNOSIS — Z1152 Encounter for screening for COVID-19: Secondary | ICD-10-CM | POA: Diagnosis not present

## 2022-08-24 LAB — CBC WITH DIFFERENTIAL/PLATELET
Abs Immature Granulocytes: 0.01 10*3/uL (ref 0.00–0.07)
Basophils Absolute: 0 10*3/uL (ref 0.0–0.1)
Basophils Relative: 0 %
Eosinophils Absolute: 0.1 10*3/uL (ref 0.0–0.5)
Eosinophils Relative: 1 %
HCT: 40.7 % (ref 36.0–46.0)
Hemoglobin: 12.4 g/dL (ref 12.0–15.0)
Immature Granulocytes: 0 %
Lymphocytes Relative: 23 %
Lymphs Abs: 1.3 10*3/uL (ref 0.7–4.0)
MCH: 23.4 pg — ABNORMAL LOW (ref 26.0–34.0)
MCHC: 30.5 g/dL (ref 30.0–36.0)
MCV: 76.9 fL — ABNORMAL LOW (ref 80.0–100.0)
Monocytes Absolute: 0.7 10*3/uL (ref 0.1–1.0)
Monocytes Relative: 12 %
Neutro Abs: 3.5 10*3/uL (ref 1.7–7.7)
Neutrophils Relative %: 64 %
Platelets: 192 10*3/uL (ref 150–400)
RBC: 5.29 MIL/uL — ABNORMAL HIGH (ref 3.87–5.11)
RDW: 15 % (ref 11.5–15.5)
WBC: 5.5 10*3/uL (ref 4.0–10.5)
nRBC: 0 % (ref 0.0–0.2)

## 2022-08-24 MED ORDER — DICYCLOMINE HCL 10 MG PO CAPS
10.0000 mg | ORAL_CAPSULE | Freq: Once | ORAL | Status: AC
  Administered 2022-08-25: 10 mg via ORAL
  Filled 2022-08-24: qty 1

## 2022-08-24 NOTE — ED Triage Notes (Signed)
Pt reports with vomiting, diarrhea, gas, and bloating since yesterday.

## 2022-08-24 NOTE — ED Provider Triage Note (Signed)
Emergency Medicine Provider Triage Evaluation Note  [name] , a 61 y.o. female  was evaluated in triage.  Pt complains of abdominal cramping, nausea, vomiting, diarrhea, abdominal bloating, flatus.  Patient reports yesterday she arrived home from a trip, she had eaten bacon eggs and waffles that day and then started having abdominal cramping nausea and vomiting.  Patient states the symptoms persisted throughout the day.  The patient denies any fevers, sore throat, cough.  Patient denies any dysuria.  Review of Systems  Positive:  Negative:   Physical Exam  BP 125/80   Pulse 76   Temp (!) 97.5 F (36.4 C) (Oral)   Resp 15   Ht '5\' 6"'$  (1.676 m)   Wt 88 kg   LMP 09/17/2016 (Approximate)   SpO2 100%   BMI 31.31 kg/m  Gen:   Awake, no distress   Resp:  Normal effort  MSK:   Moves extremities without difficulty  Other:    Medical Decision Making  Medically screening exam initiated at 11:26 PM.  Appropriate orders placed.  [name] was informed that the remainder of the evaluation will be completed by another provider, this initial triage assessment does not replace that evaluation, and the importance of remaining in the ED until their evaluation is complete.     Azucena Cecil, PA-C 08/24/22 2327

## 2022-08-25 LAB — COMPREHENSIVE METABOLIC PANEL
ALT: 22 U/L (ref 0–44)
AST: 23 U/L (ref 15–41)
Albumin: 4 g/dL (ref 3.5–5.0)
Alkaline Phosphatase: 57 U/L (ref 38–126)
Anion gap: 4 — ABNORMAL LOW (ref 5–15)
BUN: 8 mg/dL (ref 8–23)
CO2: 26 mmol/L (ref 22–32)
Calcium: 8 mg/dL — ABNORMAL LOW (ref 8.9–10.3)
Chloride: 111 mmol/L (ref 98–111)
Creatinine, Ser: 0.73 mg/dL (ref 0.44–1.00)
GFR, Estimated: >60 mL/min (ref >60)
Glucose, Bld: 99 mg/dL (ref 70–99)
Potassium: 3.2 mmol/L — ABNORMAL LOW (ref 3.5–5.1)
Sodium: 141 mmol/L (ref 135–145)
Total Bilirubin: 0.6 mg/dL (ref 0.3–1.2)
Total Protein: 6.8 g/dL (ref 6.5–8.1)

## 2022-08-25 LAB — RESP PANEL BY RT-PCR (RSV, FLU A&B, COVID)  RVPGX2
Influenza A by PCR: NEGATIVE
Influenza B by PCR: NEGATIVE
Resp Syncytial Virus by PCR: NEGATIVE
SARS Coronavirus 2 by RT PCR: NEGATIVE

## 2022-08-25 LAB — LIPASE, BLOOD: Lipase: 30 U/L (ref 11–51)

## 2022-08-25 MED ORDER — SODIUM CHLORIDE 0.9 % IV BOLUS
1000.0000 mL | Freq: Once | INTRAVENOUS | Status: AC
  Administered 2022-08-25: 1000 mL via INTRAVENOUS

## 2022-08-25 NOTE — ED Provider Notes (Signed)
[facility] DEPT Provider Note   CSN: [id number] Arrival date & time: 08/24/22  2256     History  Chief Complaint  Patient presents with   Emesis   Diarrhea   Bloated    [name] is a 61 y.o. female with history of mitral valve prolapse, hemorrhoids, GERD, thyroid nodules, anxiety, depression, IBS who presents the emergency department complaining of abdominal cramping, nausea, vomiting, diarrhea that started yesterday.  Patient states that she got home yesterday from a trip, ate bacon, eggs, and waffles.  Then started having abdominal cramping, nausea and vomiting.  She states the symptoms persisted throughout the day.  No known sick contacts.  Denies fever, cough, sore throat, dysuria.  Has been on antibiotics a few times this year for urinary tract infections and dental abscesses.  Denies any recent hospitalization.  Denies history of C. difficile.   Emesis Associated symptoms: chills and diarrhea   Associated symptoms: no fever   Diarrhea Associated symptoms: chills and vomiting   Associated symptoms: no fever        Home Medications Prior to Admission medications   Medication Sig Start Date End Date Taking? Authorizing Provider  Ascorbic Acid (VITAMIN C) 1000 MG tablet Take 2,000 mg by mouth at bedtime.    [provider]  aspirin EC 81 MG EC tablet Take 1 tablet (81 mg total) by mouth daily. 10/21/19   Dixie Dials, MD  Calcium Carb-Cholecalciferol (OYSTER SHELL CALCIUM 250+D PO) Take 2 tablets by mouth at bedtime.    [provider]  Cholecalciferol (VITAMIN D) 50 MCG (2000 UT) tablet Take 4,000 Units by mouth at bedtime.     [provider]  losartan (COZAAR) 50 MG tablet Take 25 mg by mouth 2 (two) times daily.    [provider]  metoprolol succinate (TOPROL-XL) 25 MG 24 hr tablet Take 25 mg by mouth 2 (two) times daily.     [provider]  metoprolol tartrate (LOPRESSOR) 25 MG  tablet Take 25 mg by mouth 2 (two) times daily. 06/29/22   [provider]  Multiple Vitamins-Minerals (ZINC PO) Take 1 tablet by mouth at bedtime.    [provider]  PARoxetine (PAXIL-CR) 25 MG 24 hr tablet Take 25 mg by mouth daily.    [provider]  Polyethyl Glycol-Propyl Glycol (SYSTANE OP) Place 2 drops into both eyes daily.    [provider]  potassium chloride SA (KLOR-CON M20) 20 MEQ tablet Take 2 tablets (40 mEq total) by mouth daily. TAKE 2 TABLETS BY MOUTH EVERY DAY 02/02/22 01/28/23  Horald Pollen, MD  TURMERIC PO Take 1 capsule by mouth daily as needed (pain/inflammation).    [provider]      Allergies    Doxycycline    Review of Systems   Review of Systems  Constitutional:  Positive for chills. Negative for fever.  Gastrointestinal:  Positive for abdominal distention, diarrhea, nausea and vomiting. Negative for blood in stool.  Genitourinary:  Negative for dysuria and hematuria.  All other systems reviewed and are negative.   Physical Exam Updated Vital Signs BP 119/69   Pulse 72   Temp (!) 97.5 F (36.4 C) (Oral)   Resp 13   Ht '5\' 6"'$  (1.676 m)   Wt 88 kg   LMP 09/17/2016 (Approximate)   SpO2 100%   BMI 31.31 kg/m  Physical Exam Vitals and nursing note reviewed.  Constitutional:      Appearance: Normal appearance.  HENT:     Head: Normocephalic and atraumatic.  Eyes:     Conjunctiva/sclera: Conjunctivae normal.  Cardiovascular:     Rate and Rhythm: Normal rate and regular rhythm.  Pulmonary:     Effort: Pulmonary effort is normal. No respiratory distress.     Breath sounds: Normal breath sounds.  Abdominal:     General: There is no distension.     Palpations: Abdomen is soft.     Tenderness: There is no abdominal tenderness.  Skin:    General: Skin is warm and dry.  Neurological:     General: No focal deficit present.     Mental Status: She is alert.    ED Results / Procedures /  Treatments   Labs (all labs ordered are listed, but only abnormal results are displayed) Labs Reviewed  COMPREHENSIVE METABOLIC PANEL - Abnormal; Notable for the following components:      Result Value   Potassium 3.2 (*)    Calcium 8.0 (*)    Anion gap 4 (*)    All other components within normal limits  CBC WITH DIFFERENTIAL/PLATELET - Abnormal; Notable for the following components:   RBC 5.29 (*)    MCV 76.9 (*)    MCH 23.4 (*)    All other components within normal limits  RESP PANEL BY RT-PCR (RSV, FLU A&B, COVID)  RVPGX2  LIPASE, BLOOD  URINALYSIS, ROUTINE W REFLEX MICROSCOPIC    EKG None  Radiology No results found.  Procedures Procedures    Medications Ordered in ED Medications  dicyclomine (BENTYL) capsule 10 mg (10 mg Oral Given 08/25/22 0102)  sodium chloride 0.9 % bolus 1,000 mL (1,000 mLs Intravenous New Bag/Given 08/25/22 0122)    ED Course/ Medical Decision Making/ A&P                           Medical Decision Making Amount and/or Complexity of Data Reviewed Labs: ordered.  This patient is a 61 y.o. female  who presents to the ED for concern of abdominal bloating, nausea, vomiting, and diarrhea.   Differential diagnoses prior to evaluation: The emergent differential diagnosis includes, but is not limited to,  Infectious causes such as: Viral, Bacterial, Parasitic; Toxin. Noninfectious causes such as: GI Bleed, Appendicitis, Mesenteric Ischemia, Diverticulitis, endocrine causes (adrenal, thyroid), Toxicologic exposures, Drug-associated, IBD.  This is not an exhaustive differential.   Past Medical History / Co-morbidities: mitral valve prolapse, hemorrhoids, GERD, thyroid nodules, anxiety, depression, IBS  Additional history: Chart reviewed. Pertinent results include: previously followed with [facility]  Physical Exam: Physical exam performed. The pertinent findings include: Normal vital signs, afebrile.  Abdomen soft, nontender.  Lab  Tests/Imaging studies: I personally interpreted labs/imaging and the pertinent results include: No leukocytosis, normal hemoglobin.  Potassium 3.2, patient states she is already on potassium replacement.  Normal lipase.  No urinary symptoms, so will defer UA at this time.  Respiratory panel pending.   As patient is clinically well-appearing, with normal vital signs, and nonsurgical abdomen, do not believe she is requiring abdominal imaging at this time.  Cardiac monitoring: EKG obtained and interpreted by my attending physician which shows: Normal sinus rhythm   Medications: I ordered medication including IV fluids.  Bentyl ordered from triage..  I have reviewed the patients home medicines and have made adjustments as needed.   Disposition: After consideration of the diagnostic results and the patients response to treatment, I feel that emergency department workup does not suggest an  emergent condition requiring admission or immediate intervention beyond what has been performed at this time. The plan is: Discharge home with symptomatic management of likely viral gastroenteritis.  Patient is hemodynamically stable, not septic.  Low concern for acute bacterial infection such as C. Difficile. The patient is safe for discharge and has been instructed to return immediately for worsening symptoms, change in symptoms or any other concerns.  Final Clinical Impression(s) / ED Diagnoses Final diagnoses:  Viral gastroenteritis  Nausea vomiting and diarrhea    Rx / DC Orders ED Discharge Orders     None      Portions of this report may have been transcribed using voice recognition software. Every effort was made to ensure accuracy; however, inadvertent computerized transcription errors may be present.    Estill Cotta 08/25/22 0211    Quintella Reichert, MD 08/25/22 [phone]

## 2022-08-25 NOTE — Discharge Instructions (Signed)
You were seen in the emergency department today for vomiting and diarrhea.  As we discussed that you likely have a viral gastrointestinal infection.  This is best treated with increased fluid intake, and nausea or diarrhea medication as needed.  You can take over-the-counter diarrhea medication while you continue to have loose stool.  I recommend letting your doctor know that you were seen in the ER, and following up with them if your symptoms persist you can discuss obtaining a stool sample.  However, if your symptoms worsen in any way, recommend returning to the ER for further evaluation.

## 2022-08-26 ENCOUNTER — Other Ambulatory Visit: Payer: Self-pay

## 2022-08-26 ENCOUNTER — Ambulatory Visit: Payer: 59 | Admitting: Family Medicine

## 2022-08-26 ENCOUNTER — Encounter (HOSPITAL_COMMUNITY): Payer: Self-pay

## 2022-08-26 ENCOUNTER — Encounter: Payer: Self-pay | Admitting: Family Medicine

## 2022-08-26 ENCOUNTER — Ambulatory Visit (INDEPENDENT_AMBULATORY_CARE_PROVIDER_SITE_OTHER): Payer: 59

## 2022-08-26 ENCOUNTER — Emergency Department (HOSPITAL_COMMUNITY)
Admission: EM | Admit: 2022-08-26 | Discharge: 2022-08-26 | Discharge status: Against Medical Advice | Payer: 59 | Attending: Emergency Medicine | Admitting: Emergency Medicine

## 2022-08-26 VITALS — BP 122/86 | HR 61 | Temp 97.6°F | Ht 66.0 in | Wt 199.0 lb

## 2022-08-26 DIAGNOSIS — E876 Hypokalemia: Secondary | ICD-10-CM | POA: Diagnosis not present

## 2022-08-26 DIAGNOSIS — R109 Unspecified abdominal pain: Secondary | ICD-10-CM | POA: Insufficient documentation

## 2022-08-26 DIAGNOSIS — R10814 Left lower quadrant abdominal tenderness: Secondary | ICD-10-CM

## 2022-08-26 DIAGNOSIS — R140 Abdominal distension (gaseous): Secondary | ICD-10-CM

## 2022-08-26 DIAGNOSIS — K581 Irritable bowel syndrome with constipation: Secondary | ICD-10-CM | POA: Diagnosis not present

## 2022-08-26 DIAGNOSIS — R1111 Vomiting without nausea: Secondary | ICD-10-CM | POA: Insufficient documentation

## 2022-08-26 DIAGNOSIS — R197 Diarrhea, unspecified: Secondary | ICD-10-CM | POA: Insufficient documentation

## 2022-08-26 DIAGNOSIS — R112 Nausea with vomiting, unspecified: Secondary | ICD-10-CM | POA: Diagnosis not present

## 2022-08-26 DIAGNOSIS — Z5321 Procedure and treatment not carried out due to patient leaving prior to being seen by health care provider: Secondary | ICD-10-CM | POA: Diagnosis not present

## 2022-08-26 LAB — CBC WITH DIFFERENTIAL/PLATELET
Basophils Absolute: 0 10*3/uL (ref 0.0–0.1)
Basophils Relative: 0.5 % (ref 0.0–3.0)
Eosinophils Absolute: 0.1 10*3/uL (ref 0.0–0.7)
Eosinophils Relative: 2.1 % (ref 0.0–5.0)
HCT: 40.3 % (ref 36.0–46.0)
Hemoglobin: 12.6 g/dL (ref 12.0–15.0)
Lymphocytes Relative: 25.1 % (ref 12.0–46.0)
Lymphs Abs: 1.2 10*3/uL (ref 0.7–4.0)
MCHC: 31.2 g/dL (ref 30.0–36.0)
MCV: 76 fl — ABNORMAL LOW (ref 78.0–100.0)
Monocytes Absolute: 0.9 10*3/uL (ref 0.1–1.0)
Monocytes Relative: 17.8 % — ABNORMAL HIGH (ref 3.0–12.0)
Neutro Abs: 2.7 10*3/uL (ref 1.4–7.7)
Neutrophils Relative %: 54.5 % (ref 43.0–77.0)
Platelets: 193 10*3/uL (ref 150.0–400.0)
RBC: 5.3 Mil/uL — ABNORMAL HIGH (ref 3.87–5.11)
RDW: 14.8 % (ref 11.5–15.5)
WBC: 4.9 10*3/uL (ref 4.0–10.5)

## 2022-08-26 LAB — LIPASE: Lipase: 8 U/L — ABNORMAL LOW (ref 11.0–59.0)

## 2022-08-26 LAB — COMPREHENSIVE METABOLIC PANEL
ALT: 18 U/L (ref 0–35)
AST: 20 U/L (ref 0–37)
Albumin: 4.1 g/dL (ref 3.5–5.2)
Alkaline Phosphatase: 57 U/L (ref 39–117)
BUN: 5 mg/dL — ABNORMAL LOW (ref 6–23)
CO2: 33 mEq/L — ABNORMAL HIGH (ref 19–32)
Calcium: 8.6 mg/dL (ref 8.4–10.5)
Chloride: 108 mEq/L (ref 96–112)
Creatinine, Ser: 0.71 mg/dL (ref 0.40–1.20)
GFR: 90.62 mL/min (ref >60.00)
Glucose, Bld: 87 mg/dL (ref 70–99)
Potassium: 3.7 mEq/L (ref 3.5–5.1)
Sodium: 142 mEq/L (ref 135–145)
Total Bilirubin: 0.5 mg/dL (ref 0.2–1.2)
Total Protein: 6.5 g/dL (ref 6.0–8.3)

## 2022-08-26 MED ORDER — DICYCLOMINE HCL 10 MG PO CAPS: 10.0000 mg | ORAL_CAPSULE | Freq: Three times a day (TID) | ORAL | 0 refills | Status: DC | PRN

## 2022-08-26 NOTE — ED Triage Notes (Signed)
C/o ongoing abdominal cramping, nausea, vomiting and diarrhea after eating some bacon and eggs. Suspected she had food poisoning.  Seen here yesterday for same sx.

## 2022-08-26 NOTE — Progress Notes (Signed)
Subjective:     Patient ID: [name], female    DOB: 03-Aug-1961, 61 y.o.   MRN: [id number]  Chief Complaint  Patient presents with   GI Problem    Starting Monday had vomiting and diarrhea, after that she felt fine. The next day she tried to eat but wasn't able to due to discomfort, gas, bloating and tightness/pain feeling. Usually happens at night    HPI Patient is in today for a 3 day hx of abdominal bloating, vomiting and diarrhea. She thought she had food poisoning but she is still not feeling well.  States she cannot eat without her stomach feeling bloating,  Watery diarrhea. Improving.   Hx of IBS- with constipation   Denies fever, chills, dizziness, chest pain, palpitations, shortness of breath, nausea, urinary symptoms, LE edema.       Health Maintenance Due  Topic Date Due   PAP SMEAR-Modifier  11/11/2020    Past Medical History:  Diagnosis Date   Anemia    Anxiety    Arrhythmia 2013   Colon polyp 08/28/2012   Tubular adenoma   Dense breast    Depression    Genital warts    GERD (gastroesophageal reflux disease)    Heart murmur    Hemorrhoids    Hx of cardiac catheterization 2009   normal coronary arteries   Hx of menorrhagia    Novasure   Hypokalemia    Hypomagnesemia    Hypophosphatemia    Lactose intolerance in adult    Meniscus degeneration, right    torn r knee/ 10/2019   Mitral valve prolapse    Normal cardiac stress test 03/09/2012   no ischemia   Positive PPD    Rapid heart beat    States "irreg heart beat"   Thyroid disease    Thyroid nodules    Past Surgical History:  Procedure Laterality Date   CARDIAC CATHETERIZATION     x2 with normal results per pt   NOVASURE ABLATION     UPPER GASTROINTESTINAL ENDOSCOPY  May 2013    Family History  Problem Relation Age of Onset   Heart disease Father        died Mi 49   Hypertension Maternal Grandfather    Diabetes Maternal Grandfather    Heart disease Maternal  Grandfather    Colon cancer Maternal Grandfather    Rectal cancer Maternal Grandfather    Diabetes Maternal Grandmother    Prostate cancer Neg Hx    Breast cancer Neg Hx     Social History   Socioeconomic History   Marital status: Divorced    Spouse name: Not on file   Number of children: 2   Years of education: 16   Highest education level: Not on file  Occupational History   Occupation: dialysis Engineer, production: [employer]  Tobacco Use   Smoking status: Never   Smokeless tobacco: Never  Vaping Use   Vaping Use: Never used  Substance and Sexual Activity   Alcohol use: No   Drug use: No   Sexual activity: Yes  Other Topics Concern   Not on file  Social History Narrative   Ms. Bour is a divorced [place] American female who works as a Engineer, 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 She lived in [place] and in [place] for a number of years her mom is from [place]. 16 years of education went to college at  [employer] lives at home with her son who is in his 20s no pets   Neg ets tob etoh hx PA   6 hours of sleep   G2P2    TD2010  colonoscopy 2009   Now running  own business    HH of 2    Left handed    Caffeine use: tea sometimes         Social Determinants of Health   Financial Resource Strain: Not on file  Food Insecurity: Not on file  Transportation Needs: Not on file  Physical Activity: Not on file  Stress: Not on file  Social Connections: Not on file  Intimate Partner Violence: Not on file    Outpatient Medications Prior to Visit  Medication Sig Dispense Refill   Ascorbic Acid (VITAMIN C) 1000 MG tablet Take 2,000 mg by mouth at bedtime.     aspirin EC 81 MG EC tablet Take 1 tablet (81 mg total) by mouth daily.     Calcium Carb-Cholecalciferol (OYSTER SHELL CALCIUM 250+D PO) Take 2 tablets by mouth at bedtime.     Cholecalciferol (VITAMIN D) 50 MCG (2000 UT) tablet Take 4,000 Units by mouth  at bedtime.      losartan (COZAAR) 50 MG tablet Take 25 mg by mouth 2 (two) times daily.     metoprolol succinate (TOPROL-XL) 25 MG 24 hr tablet Take 25 mg by mouth 2 (two) times daily.      metoprolol tartrate (LOPRESSOR) 25 MG tablet Take 25 mg by mouth 2 (two) times daily.     Multiple Vitamins-Minerals (ZINC PO) Take 1 tablet by mouth at bedtime.     PARoxetine (PAXIL-CR) 25 MG 24 hr tablet Take 25 mg by mouth daily.     Polyethyl Glycol-Propyl Glycol (SYSTANE OP) Place 2 drops into both eyes daily.     potassium chloride SA (KLOR-CON M20) 20 MEQ tablet Take 2 tablets (40 mEq total) by mouth daily. TAKE 2 TABLETS BY MOUTH EVERY DAY 180 tablet 3   TURMERIC PO Take 1 capsule by mouth daily as needed (pain/inflammation).     No facility-administered medications prior to visit.    Allergies  Allergen Reactions   Doxycycline Rash    ROS     Objective:    Physical Exam Constitutional:      General: She is not in acute distress.    Appearance: She is not ill-appearing.  HENT:     Mouth/Throat:     Mouth: Mucous membranes are moist.  Eyes:     Conjunctiva/sclera: Conjunctivae normal.  Cardiovascular:     Rate and Rhythm: Normal rate and regular rhythm.  Pulmonary:     Effort: Pulmonary effort is normal.     Breath sounds: Normal breath sounds.  Abdominal:     General: Bowel sounds are decreased.     Palpations: Abdomen is soft.     Tenderness: There is abdominal tenderness in the left lower quadrant. There is no right CVA tenderness, left CVA tenderness, guarding or rebound. Negative signs include Murphy's sign and McBurney's sign.  Skin:    General: Skin is warm and dry.  Neurological:     General: No focal deficit present.     Mental Status: She is alert and oriented to person, place, and time.  Psychiatric:        Mood and Affect: Mood normal.        Behavior: Behavior normal.        Thought Content: Thought content normal.  BP 122/86 (BP Location: Left Arm,  Patient Position: Sitting, Cuff Size: Large)   Pulse 61   Temp 97.6 F (36.4 C) (Temporal)   Ht '5\' 6"'$  (1.676 m)   Wt 199 lb (90.3 kg)   LMP 09/17/2016 (Approximate)   SpO2 99%   BMI 32.12 kg/m  Wt Readings from Last 3 Encounters:  08/26/22 199 lb (90.3 kg)  08/24/22 194 lb (88 kg)  07/28/22 199 lb 6.4 oz (90.4 kg)       Assessment & Plan:   Problem List Items Addressed This Visit       Digestive   IRRITABLE BOWEL SYNDROME   Relevant Medications   dicyclomine (BENTYL) 10 MG capsule   Vomiting without nausea   Relevant Orders   CBC with Differential/Platelet (Completed)   Comprehensive metabolic panel (Completed)   Lipase (Completed)   US Abdomen Limited RUQ (LIVER/GB)   DG Abd 2 Views (Completed)     Other   Diarrhea   Relevant Orders   CBC with Differential/Platelet (Completed)   Comprehensive metabolic panel (Completed)   Lipase (Completed)   US Abdomen Limited RUQ (LIVER/GB)   DG Abd 2 Views (Completed)   Left lower quadrant abdominal tenderness without rebound tenderness   Relevant Orders   CBC with Differential/Platelet (Completed)   Comprehensive metabolic panel (Completed)   Lipase (Completed)   US Abdomen Limited RUQ (LIVER/GB)   DG Abd 2 Views (Completed)   Postprandial abdominal bloating - Primary   Relevant Orders   CBC with Differential/Platelet (Completed)   Comprehensive metabolic panel (Completed)   Lipase (Completed)   US Abdomen Limited RUQ (LIVER/GB)   DG Abd 2 Views (Completed)   Other Visit Diagnoses     Abdominal spasms       Relevant Medications   dicyclomine (BENTYL) 10 MG capsule   Hypokalemia       Relevant Orders   Comprehensive metabolic panel (Completed)      No red flag symptoms.  Reviewed ED notes and lab results.  Advised her to take potassium supplement due to low potassium.  Bentyl prescribed for spasms.  Plan films of abdomen ordered to rule out obstruction.  Abdomen non surgical.  Check labs including lipase.   RUQ [place] ordered to rule out gallbladder disease. Recommend eating a bland, low fat diet and staying hydrated. Follow up if worsening or go to the ED if worsening and our office is closed.   I am having [name] start on dicyclomine. I am also having her maintain her Vitamin D, PARoxetine, losartan, metoprolol succinate, TURMERIC PO, Calcium Carb-Cholecalciferol (OYSTER SHELL CALCIUM 250+D PO), vitamin C, Multiple Vitamins-Minerals (ZINC PO), Polyethyl Glycol-Propyl Glycol (SYSTANE OP), aspirin EC, potassium chloride SA, and metoprolol tartrate.  Meds ordered this encounter  Medications   dicyclomine (BENTYL) 10 MG capsule    Sig: Take 1 capsule (10 mg total) by mouth 3 (three) times daily as needed for spasms.    Dispense:  30 capsule    Refill:  0    Order Specific Question:   Supervising Provider    Answer:   Pricilla Holm A [[id number]]

## 2022-08-26 NOTE — Patient Instructions (Addendum)
Please go downstairs for labs and x-ray of your abdomen before you leave today.  Continue staying well-hydrated and eat a bland diet.  Avoid spicy foods, acidic foods and foods high in fat.  You may take Bentyl as needed for abdominal spasms but no more than every 8 hours.  You will receive a call from [facility] imaging to schedule an ultrasound of your abdomen.  If you get worse while waiting for the ultrasound, you will need to go to the emergency department for further evaluation.   Bland Diet A bland diet may consist of soft foods or foods that are not high in fat or are not greasy, acidic, or spicy. Avoiding certain foods may cause less irritation to your mouth, throat, stomach, or gastrointestinal tract. Avoiding certain foods may make you feel better. Everyone's tolerances are different. A bland diet should be based on what you can tolerate and what may cause discomfort. What is my plan? Your health care provider or dietitian may recommend specific changes to your diet to treat your symptoms. These changes may include: Eating small meals frequently. Cooking food until it is soft enough to chew easily. Taking the time to chew your food thoroughly, so it is easy to swallow and digest. Avoiding foods that cause you discomfort. These may include spicy food, fried food, greasy foods, hard-to-chew foods, or citrus fruits and juices. Drinking slowly. What are tips for following this plan? Reading food labels To reduce fiber intake, look for food labels that say "whole," such as whole wheat or whole grain. Shopping Avoid food items that may have nuts or seeds. Avoid vegetables that may make you gassy or have a tough texture, such as broccoli, cauliflower, or corn. Cooking Cook foods thoroughly so they have a soft texture. Meal planning Make sure you include foods from all food groups to eat a balanced diet. Eat a variety of types of foods. Eat foods and drink beverages that do not  cause you discomfort. These may include soups and broths with cooked meats, pasta, and vegetables. Lifestyle Sit up after meals, avoid tight clothing, and take time to eat and chew your food slowly. Ask your health care provider whether you should take dietary supplements. General information Mildly season your foods. Some seasonings, such as cayenne pepper, vinegar, or hot sauce, may cause irritation. The foods, beverages, or seasonings to avoid should be based on individual tolerance. What foods should I eat? Fruits Canned or cooked fruit such as peaches, pears, or applesauce. Bananas. Vegetables Well-cooked vegetables. Canned or cooked vegetables such as carrots, green beans, beets, or spinach. Mashed or boiled potatoes. Grains  Hot cereals, such as cream of wheat and processed oatmeal. Rice. Bread, crackers, pasta, or tortillas made from refined white flour. Meats and other proteins  Eggs. Creamy peanut butter or other nut butters. Lean, well-cooked tender meats, such as beef, pork, chicken, or fish. Dairy Low-fat dairy products such as milk, cottage cheese, or yogurt. Beverages  Water. Herbal tea. Apple juice. Fats and oils Mild salad dressings. Canola or olive oil. Sweets and desserts Low-fat pudding, custard, or ice cream. Fruit gelatin. The items listed above may not be a complete list of foods and beverages you can eat. Contact a dietitian for more information. What foods should I avoid? Fruits Citrus fruits, such as oranges and grapefruit. Fruits with a stringy texture. Fruits that have lots of seeds, such as kiwi or strawberries. Dried fruits. Vegetables Raw, uncooked vegetables. Salads. Grains Whole grain breads, muffins, and cereals.  Meats and other proteins Tough, fibrous meats. Highly seasoned meat such as corned beef, smoked meats, or fish. Processed high-fat meats such as brats, hot dogs, or sausage. Dairy Full-fat dairy foods such as ice cream and  cheese. Beverages Caffeinated drinks. Alcohol. Seasonings and condiments Strongly flavored seasonings or condiments. Hot sauce. Salsa. Other foods Spicy foods. Fried or greasy foods. Sour foods, such as pickled or fermented foods like sauerkraut. Foods high in fiber. The items listed above may not be a complete list of foods and beverages you should avoid. Contact a dietitian for more information. Summary A bland diet should be based on individual tolerance. It may consist of foods that are soft textured and do not have a lot of fat, fiber, acid, or seasonings. A bland diet may be recommended because avoiding certain foods, beverages, or spices may make you feel better. This information is not intended to replace advice given to you by your health care provider. Make sure you discuss any questions you have with your health care provider. Document Revised: 07/20/2021 Document Reviewed: 07/20/2021 Elsevier Patient Education  [place].

## 2022-08-26 NOTE — ED Notes (Signed)
Reported leaving from waiting area.

## 2022-08-27 ENCOUNTER — Ambulatory Visit
Admission: RE | Admit: 2022-08-27 | Discharge: 2022-08-27 | Disposition: A | Discharge status: Home / Self Care | Payer: 59 | Source: Ambulatory Visit | Attending: Family Medicine | Admitting: Family Medicine

## 2022-08-27 DIAGNOSIS — R109 Unspecified abdominal pain: Secondary | ICD-10-CM | POA: Diagnosis not present

## 2022-08-27 DIAGNOSIS — R10814 Left lower quadrant abdominal tenderness: Secondary | ICD-10-CM

## 2022-08-27 DIAGNOSIS — R1111 Vomiting without nausea: Secondary | ICD-10-CM

## 2022-08-27 DIAGNOSIS — R197 Diarrhea, unspecified: Secondary | ICD-10-CM

## 2022-08-27 DIAGNOSIS — R140 Abdominal distension (gaseous): Secondary | ICD-10-CM

## 2022-08-30 DIAGNOSIS — F411 Generalized anxiety disorder: Secondary | ICD-10-CM | POA: Diagnosis not present

## 2022-08-30 DIAGNOSIS — R69 Illness, unspecified: Secondary | ICD-10-CM | POA: Diagnosis not present

## 2022-08-30 DIAGNOSIS — F4310 Post-traumatic stress disorder, unspecified: Secondary | ICD-10-CM | POA: Diagnosis not present

## 2022-09-17 DIAGNOSIS — R8281 Pyuria: Secondary | ICD-10-CM | POA: Diagnosis not present

## 2022-09-20 ENCOUNTER — Telehealth: Payer: Self-pay | Admitting: Emergency Medicine

## 2022-09-20 NOTE — Telephone Encounter (Signed)
Patient called needing a new referral for Cardiology due to her new insurance plan. Patient is requesting a referral to [facility]. Best callback number for patient is [phone].

## 2022-09-20 NOTE — Telephone Encounter (Signed)
Called patient and left message for patient to call office to set up an appointment to be seen for the referral to a Cardiology. I don't see any past referrals or OV to or from a cardiologist.

## 2022-09-24 DIAGNOSIS — R109 Unspecified abdominal pain: Secondary | ICD-10-CM | POA: Diagnosis not present

## 2022-10-05 DIAGNOSIS — M25562 Pain in left knee: Secondary | ICD-10-CM | POA: Diagnosis not present

## 2022-10-12 ENCOUNTER — Ambulatory Visit (HOSPITAL_BASED_OUTPATIENT_CLINIC_OR_DEPARTMENT_OTHER): Payer: 59 | Attending: Primary Care | Admitting: Pulmonary Disease

## 2022-10-12 DIAGNOSIS — I493 Ventricular premature depolarization: Secondary | ICD-10-CM | POA: Insufficient documentation

## 2022-10-12 DIAGNOSIS — R400 Somnolence: Secondary | ICD-10-CM | POA: Insufficient documentation

## 2022-10-12 DIAGNOSIS — R0683 Snoring: Secondary | ICD-10-CM | POA: Diagnosis not present

## 2022-10-12 DIAGNOSIS — I10 Essential (primary) hypertension: Secondary | ICD-10-CM | POA: Diagnosis not present

## 2022-10-12 DIAGNOSIS — R519 Headache, unspecified: Secondary | ICD-10-CM | POA: Insufficient documentation

## 2022-10-12 DIAGNOSIS — G4730 Sleep apnea, unspecified: Secondary | ICD-10-CM

## 2022-10-12 DIAGNOSIS — R5383 Other fatigue: Secondary | ICD-10-CM | POA: Diagnosis not present

## 2022-10-13 ENCOUNTER — Ambulatory Visit: Payer: 59 | Attending: Cardiovascular Disease | Admitting: Cardiovascular Disease

## 2022-10-13 ENCOUNTER — Ambulatory Visit (INDEPENDENT_AMBULATORY_CARE_PROVIDER_SITE_OTHER): Payer: 59

## 2022-10-13 ENCOUNTER — Encounter: Payer: Self-pay | Admitting: Cardiovascular Disease

## 2022-10-13 VITALS — BP 106/64 | HR 69 | Ht 66.0 in | Wt 200.0 lb

## 2022-10-13 DIAGNOSIS — R002 Palpitations: Secondary | ICD-10-CM

## 2022-10-13 DIAGNOSIS — G4730 Sleep apnea, unspecified: Secondary | ICD-10-CM

## 2022-10-13 NOTE — Progress Notes (Signed)
10/13/2022 [name]   [birth year]  [id number]  Primary Physician Sagardia, Ines Bloomer, MD Primary Cardiologist: Lorretta Harp MD Garret Reddish, [place], [place]  HPI:  [name] is a 62 y.o. 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  She is transferring care from Dr. Doylene Canard to me because of change in her insurance.  Her only risk factor is treated hypertension.  Her mother did have stents.  She is never had a heart attack or stroke.  She denies chest pain or shortness of breath.  Sequential history of mitral valve prolapse.  She had a cardiac catheterization performed in 2009 by Dr. Tamala Julian which was normal.  She also has obstructive sleep apnea.   Current Meds  Medication Sig   Ascorbic Acid (VITAMIN C) 1000 MG tablet Take 2,000 mg by mouth at bedtime.   aspirin EC 81 MG EC tablet Take 1 tablet (81 mg total) by mouth daily.   Calcium Carb-Cholecalciferol (OYSTER SHELL CALCIUM 250+D PO) Take 2 tablets by mouth at bedtime.   Cholecalciferol (VITAMIN D) 50 MCG (2000 UT) tablet Take 4,000 Units by mouth at bedtime.    dicyclomine (BENTYL) 10 MG capsule Take 1 capsule (10 mg total) by mouth 3 (three) times daily as needed for spasms.   losartan (COZAAR) 50 MG tablet Take 25 mg by mouth 2 (two) times daily.   metoprolol succinate (TOPROL-XL) 25 MG 24 hr tablet Take 25 mg by mouth 2 (two) times daily.    metoprolol tartrate (LOPRESSOR) 25 MG tablet Take 25 mg by mouth 2 (two) times daily.   Multiple Vitamins-Minerals (ZINC PO) Take 1 tablet by mouth at bedtime.   PARoxetine (PAXIL-CR) 25 MG 24 hr tablet Take 25 mg by mouth daily.   Polyethyl Glycol-Propyl Glycol (SYSTANE OP) Place 2 drops into both eyes daily.   potassium chloride SA (KLOR-CON M20) 20 MEQ tablet Take 2 tablets (40 mEq total) by mouth daily. TAKE 2 TABLETS  BY MOUTH EVERY DAY   TURMERIC PO Take 1 capsule by mouth daily as needed (pain/inflammation).     Allergies  Allergen Reactions   Doxycycline Rash    Social History   Socioeconomic History   Marital status: Divorced    Spouse name: Not on file   Number of children: 2   Years of education: 16   Highest education level: Not on file  Occupational History   Occupation: dialysis tech    Employer: [employer]  Tobacco Use   Smoking status: Never   Smokeless tobacco: Never  Vaping Use   Vaping Use: Never used  Substance and Sexual Activity   Alcohol use: No   Drug use: No   Sexual activity: Yes  Other Topics Concern   Not on file  Social History Narrative   [name] is a divorced [place] American female who works as a Engineer, 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 She lived in [place] and in [place] for a number of years her mom is from [place]. 16 years of education went to college at [employer] lives at home with her son who is in his 57s no pets   Neg ets tob etoh hx PA   6 hours of sleep   G2P2    TD2010  colonoscopy 2009   Now running  own business    HH of 2    Left handed    Caffeine use: tea sometimes         Social Determinants of Health   Financial Resource Strain: Not on file  Food Insecurity: Not on file  Transportation Needs: Not on file  Physical Activity: Not on file  Stress: Not on file  Social Connections: Not on file  Intimate Partner Violence: Not on file     Review of Systems: General: negative for chills, fever, night sweats or weight changes.  Cardiovascular: negative for chest pain, dyspnea on exertion, edema, orthopnea, palpitations, paroxysmal nocturnal dyspnea or shortness of breath Dermatological: negative for rash Respiratory: negative for cough or wheezing Urologic: negative for hematuria Abdominal: negative for nausea, vomiting, diarrhea, bright red blood per rectum,  melena, or hematemesis Neurologic: negative for visual changes, syncope, or dizziness All other systems reviewed and are otherwise negative except as noted above.    Blood pressure 106/64, pulse 69, height '5\' 6"'$  (1.676 m), weight 200 lb (90.7 kg), last menstrual period 09/17/2016, SpO2 99 %.  General appearance: alert and no distress Neck: no adenopathy, no carotid bruit, no JVD, supple, symmetrical, trachea midline, and thyroid not enlarged, symmetric, no tenderness/mass/nodules Lungs: clear to auscultation bilaterally Heart: regular rate and rhythm, S1, S2 normal, no murmur, click, rub or gallop Extremities: extremities normal, atraumatic, no cyanosis or edema Pulses: 2+ and symmetric Skin: Skin color, texture, turgor normal. No rashes or lesions Neurologic: Grossly normal  EKG sinus rhythm at 69 with poor R wave progression and nonspecific ST and T wave changes.  I personally reviewed this EKG.  ASSESSMENT AND PLAN:   Sleep apnea History of obstructive sleep apnea currently not on CPAP.  She apparently had a sleep study performed last night.  Palpitations History of palpitations that occur mostly after meals.  She does have history of PSVT.  I am going to get a 2-week Zio patch to further evaluate.     Lorretta Harp MD FACP,FACC,FAHA, [facility] 10/13/2022 2:08 PM

## 2022-10-13 NOTE — Patient Instructions (Signed)
Medication Instructions:  Your physician recommends that you continue on your current medications as directed. Please refer to the Current Medication list given to you today.  *If you need a refill on your cardiac medications before your next appointment, please call your pharmacy*   Testing/Procedures:  [facility] Monitor Instructions  Your physician has requested you wear a ZIO patch monitor for 14 days.  This is a single patch monitor. [employer] supplies one patch monitor per enrollment. Additional stickers are not available. Please do not apply patch if you will be having a Nuclear Stress Test,  Echocardiogram, Cardiac CT, MRI, or Chest Xray during the period you would be wearing the  monitor. The patch cannot be worn during these tests. You cannot remove and re-apply the  ZIO XT patch monitor.  Your ZIO patch monitor will be mailed 3 day USPS to your address on file. It may take 3-5 days  to receive your monitor after you have been enrolled.  Once you have received your monitor, please review the enclosed instructions. Your monitor  has already been registered assigning a specific monitor serial # to you.  Billing and Patient Assistance Program Information  We have supplied [employer] with any of your insurance information on file for billing purposes. [employer] offers a sliding scale Patient Assistance Program for patients that do not have  insurance, or whose insurance does not completely cover the cost of the ZIO monitor.  You must apply for the Patient Assistance Program to qualify for this discounted rate.  To apply, please call [employer] at [phone], select option 4, select option 2, ask to apply for  Patient Assistance Program. Theodore Demark will ask your household income, and how many people  are in your household. They will quote your out-of-pocket cost based on that information.  [employer] will also be able to set up a 74-month interest-free payment plan if needed.  Applying  the monitor   Shave hair from upper left chest.  Hold abrader disc by orange tab. Rub abrader in 40 strokes over the upper left chest as  indicated in your monitor instructions.  Clean area with 4 enclosed alcohol pads. Let dry.  Apply patch as indicated in monitor instructions. Patch will be placed under collarbone on left  side of chest with arrow pointing upward.  Rub patch adhesive wings for 2 minutes. Remove white label marked "1". Remove the white  label marked "2". Rub patch adhesive wings for 2 additional minutes.  While looking in a mirror, press and release button in center of patch. A small green light will  flash 3-4 times. This will be your only indicator that the monitor has been turned on.  Do not shower for the first 24 hours. You may shower after the first 24 hours.  Press the button if you feel a symptom. You will hear a small click. Record Date, Time and  Symptom in the Patient Logbook.  When you are ready to remove the patch, follow instructions on the last 2 pages of Patient  Logbook. Stick patch monitor onto the last page of Patient Logbook.  Place Patient Logbook in the blue and white box. Use locking tab on box and tape box closed  securely. The blue and white box has prepaid postage on it. Please place it in the mailbox as  soon as possible. Your physician should have your test results approximately 7 days after the  monitor [facility]  Call [employer]  [facility] at [phone] if you have questions regarding  your ZIO XT patch monitor. Call them immediately if you see an orange light blinking on your  monitor.  If your monitor falls off in less than 4 days, contact our Monitor department at [phone].  If your monitor becomes loose or falls off after 4 days call [employer] at [phone] for  suggestions on securing your monitor    Follow-Up: At [facility], you and your health needs are our priority.  As part  of our continuing mission to provide you with exceptional heart care, we have created designated Provider Care Teams.  These Care Teams include your primary Cardiologist (physician) and Advanced Practice Providers (APPs -  Physician Assistants and Nurse Practitioners) who all work together to provide you with the care you need, when you need it.  We recommend signing up for the patient portal called "MyChart".  Sign up information is provided on this After Visit Summary.  MyChart is used to connect with patients for Virtual Visits (Telemedicine).  Patients are able to view lab/test results, encounter notes, upcoming appointments, etc.  Non-urgent messages can be sent to your provider as well.   To learn more about what you can do with MyChart, go to [email or web address].    Your next appointment:   12 month(s)  Provider:   Quay Burow, MD

## 2022-10-13 NOTE — Assessment & Plan Note (Addendum)
History of obstructive sleep apnea currently not on CPAP.  She apparently had a sleep study performed last night.

## 2022-10-13 NOTE — Progress Notes (Unsigned)
Enrolled patient for a 14 day Zio XT  monitor to be mailed to patients home  °

## 2022-10-13 NOTE — Assessment & Plan Note (Signed)
History of palpitations that occur mostly after meals.  She does have history of PSVT.  I am going to get a 2-week Zio patch to further evaluate.

## 2022-10-16 DIAGNOSIS — G4730 Sleep apnea, unspecified: Secondary | ICD-10-CM

## 2022-10-16 DIAGNOSIS — R002 Palpitations: Secondary | ICD-10-CM

## 2022-10-16 NOTE — Procedures (Signed)
     Patient Name: [name], [name] Date: 10/12/2022 Gender: Female D.O.B: 20-Jan-1961 Age (years): 40 Referring Provider: Geraldo Pitter NP Height (inches): 66 Interpreting Physician: Chesley Mires MD, ABSM Weight (lbs): 194 RPSGT: Laren Everts BMI: 31 MRN: [id number] Neck Size: 14.75  CLINICAL INFORMATION Sleep Study Type: NPSG  Indication for sleep study: Excessive Daytime Sleepiness, Fatigue, Hypertension, Morning Headaches, OSA, Snoring, Witnessed Apneas  Epworth Sleepiness Score: 15  SLEEP STUDY TECHNIQUE As per the AASM Manual for the Scoring of Sleep and Associated Events v2.3 (April 2016) with a hypopnea requiring 4% desaturations.  The channels recorded and monitored were frontal, central and occipital EEG, electrooculogram (EOG), submentalis EMG (chin), nasal and oral airflow, thoracic and abdominal wall motion, anterior tibialis EMG, snore microphone, electrocardiogram, and pulse oximetry.  MEDICATIONS Medications self-administered by patient taken the night of the study : METOPROLOL, POTASSIUM CHLORIDE  SLEEP ARCHITECTURE The study was initiated at 11:02:33 PM and ended at 5:34:39 AM.  Sleep onset time was 28.4 minutes and the sleep efficiency was 78.2%. The total sleep time was 306.5 minutes.  Stage REM latency was 105.0 minutes.  The patient spent 14.7% of the night in stage N1 sleep, 70.3% in stage N2 sleep, 0.0% in stage N3 and 15% in REM.  Alpha intrusion was absent.  Supine sleep was 35.89%.  RESPIRATORY PARAMETERS The overall apnea/hypopnea index (AHI) was 2.3 per hour. There were 0 total apneas, including 0 obstructive, 0 central and 0 mixed apneas. There were 12 hypopneas and 36 RERAs.  The AHI during Stage REM sleep was 5.2 per hour.  AHI while supine was 4.4 per hour.  The mean oxygen saturation was 94.9%. The minimum SpO2 during sleep was 90.0%.  moderate snoring was noted during this study.  CARDIAC DATA The 2 lead EKG  demonstrated sinus rhythm. The mean heart rate was 61.2 beats per minute. Other EKG findings include: PVCs.  LEG MOVEMENT DATA The total PLMS were 0 with a resulting PLMS index of 0.0. Associated arousal with leg movement index was 0.0 .  IMPRESSIONS - No significant obstructive sleep apnea occurred during this study (AHI = 2.3/h). - The patient had minimal or no oxygen desaturation during the study (Min O2 = 90.0%) - The patient snored with moderate snoring volume. - EKG findings include PVCs. - Clinically significant periodic limb movements did not occur during sleep. No significant associated arousals.  DIAGNOSIS - Snoring.  RECOMMENDATIONS - Avoid alcohol, sedatives and other CNS depressants that may worsen sleep apnea and disrupt normal sleep architecture. - Sleep hygiene should be reviewed to assess factors that may improve sleep quality. - Weight management and regular exercise should be initiated or continued if appropriate.  [Electronically signed] 10/16/2022 04:48 PM  Chesley Mires MD, ABSM Diplomate, American Board of Sleep Medicine NPI: [id number]  [place] PH: [phone]   FX: [phone] [place]

## 2022-10-25 DIAGNOSIS — R8281 Pyuria: Secondary | ICD-10-CM | POA: Diagnosis not present

## 2022-11-01 DIAGNOSIS — G4730 Sleep apnea, unspecified: Secondary | ICD-10-CM | POA: Diagnosis not present

## 2022-11-01 DIAGNOSIS — F331 Major depressive disorder, recurrent, moderate: Secondary | ICD-10-CM | POA: Diagnosis not present

## 2022-11-01 DIAGNOSIS — F411 Generalized anxiety disorder: Secondary | ICD-10-CM | POA: Diagnosis not present

## 2022-11-01 DIAGNOSIS — F4310 Post-traumatic stress disorder, unspecified: Secondary | ICD-10-CM | POA: Diagnosis not present

## 2022-11-01 DIAGNOSIS — R002 Palpitations: Secondary | ICD-10-CM | POA: Diagnosis not present

## 2022-11-02 DIAGNOSIS — F411 Generalized anxiety disorder: Secondary | ICD-10-CM | POA: Diagnosis not present

## 2022-11-02 DIAGNOSIS — F4310 Post-traumatic stress disorder, unspecified: Secondary | ICD-10-CM | POA: Diagnosis not present

## 2022-11-02 DIAGNOSIS — F331 Major depressive disorder, recurrent, moderate: Secondary | ICD-10-CM | POA: Diagnosis not present

## 2022-11-08 ENCOUNTER — Other Ambulatory Visit: Payer: Self-pay | Admitting: Emergency Medicine

## 2022-11-08 ENCOUNTER — Telehealth: Payer: Self-pay | Admitting: *Deleted

## 2022-11-08 MED ORDER — PANTOPRAZOLE SODIUM 40 MG PO TBEC: 40.0000 mg | DELAYED_RELEASE_TABLET | Freq: Every day | ORAL | 3 refills | Status: DC

## 2022-11-08 NOTE — Telephone Encounter (Signed)
Please advise on med refill. Patient is requesting pantoprazole, not on patient current med list

## 2022-11-08 NOTE — Telephone Encounter (Signed)
New prescription for pantoprazole sent to pharmacy of record today.  Thanks

## 2022-11-11 ENCOUNTER — Encounter: Payer: Self-pay | Admitting: Internal Medicine

## 2022-11-18 ENCOUNTER — Encounter: Payer: Self-pay | Admitting: Internal Medicine

## 2022-11-30 ENCOUNTER — Telehealth: Payer: Self-pay | Admitting: Cardiovascular Disease

## 2022-11-30 NOTE — Telephone Encounter (Signed)
Left voicemail for patient to return call to office. 

## 2022-11-30 NOTE — Telephone Encounter (Signed)
Patient needs to be scheduled to discuss information with provider.   Patient called for results of heart monitor. She had already received these results but feels there is something else going on and does not understand why the monitor is not showing this.  She states she is still having "irregular heartbeats". She asked did the doctor see this or someone else and give her results.  I assurred her the doctor saw this information and that he is the one giving results.  He is adamant that there is something wrong.  She would like to speak to the doctor about this.  I let her know, that we would have scheduling reach out to set an appointment.  She also wants to know what to do for her high triglycerides.  She states they are very high.  I noted that I do not see these results, and she states "they were not done there".  She states she will send these lab results to be reviewed as well.

## 2022-11-30 NOTE — Telephone Encounter (Signed)
Pt would like a callback regarding her results from the monitor she had to wear and to see about her Triglycerides being 473 so she would like to know is there something she should be doing. Please advise.

## 2022-12-01 NOTE — Telephone Encounter (Signed)
Patient has appointment for April 15th

## 2022-12-01 NOTE — Telephone Encounter (Signed)
Pt called back to speak to RN. She states she wants to sch an appt, informed her first available is 03/18/23 and offered to place her on waitlist. She just asked to speak to RN

## 2022-12-20 ENCOUNTER — Ambulatory Visit (AMBULATORY_SURGERY_CENTER): Payer: 59 | Admitting: *Deleted

## 2022-12-20 VITALS — Ht 66.0 in | Wt 189.0 lb

## 2022-12-20 DIAGNOSIS — Z86010 Personal history of colonic polyps: Secondary | ICD-10-CM

## 2022-12-20 MED ORDER — NA SULFATE-K SULFATE-MG SULF 17.5-3.13-1.6 GM/177ML PO SOLN: 1.0000 | Freq: Once | ORAL | 0 refills | Status: AC

## 2022-12-20 NOTE — Progress Notes (Signed)
Pt's pre-visit is done over the phone and all paperwork (prep instructions) sent to patient. Pt's name and DOB verified at the beginning of the pre-visit. Pt denies any difficulty with ambulating.  No egg or soy allergy known to patient  No issues known to pt with past sedation with any surgeries or procedures Pt denies having issues being intubated Pt has no issues moving head neck or swallowing No FH of Malignant Hyperthermia Pt is not on diet pills Pt is not on home 02  Pt is not on blood thinners  Pt has frequent issues with constipation RN instructed pt to use Miralax per bottles instructions a week before prep days. Pt states they will Pt is not on dialysis Pt denies any upcoming cardiac testing Pt encouraged to use to use Singlecare or Goodrx to reduce cost  Patient's chart reviewed by Cathlyn Parsons CNRA prior to pre-visit and patient appropriate for the [facility].  Pre-visit completed and red dot placed by patient's name on their procedure day (on provider's schedule).  . Visit by phone Pt states weight is 189lb Instructions reviewed with pt and pt states understanding. Instructed to review again prior to procedure. Pt states they will.  Instructions sent by mail with coupon and by my chart

## 2022-12-25 NOTE — Progress Notes (Unsigned)
Cardiology Clinic Note   Date: 12/27/2022 ID: [name], DOB October 13, 1960, MRN [id number]  Primary Cardiologist:  Nanetta Batty, MD  Patient Profile    [name] is a 62 y.o. female who presents to the clinic today for follow-up with testing.  Past medical history significant for: Palpitations. History of PSVT.   7-day ZIO 11/01/2022: Min HR 46 bpm, max HR 122 bpm, average HR 70 bpm.  Predominant underlying rhythm was sinus rhythm.  No significant arrhythmias. Hypertension. MVP. OSA. Hypertriglyceridemia.   History of Present Illness    [name] was first evaluated by Dr. Allyson Sabal on 10/13/2022 to transfer care from Dr. Jodelle Green secondary to change in her insurance.  At that time she complained of palpitations that occur mostly after meals.  7-day ZIO showed predominant sinus rhythm without significant arrhythmias.  Today, patient is concerned about recent lipid panel drawn by her therapist which showed LDL 34, HDL 40, VLDL 58, TG 403.  She has been working on improving her diet by decreasing carbohydrates and fried foods and increasing protein and whole foods.  She reports feeling terrible on this diet and beginning to return to her old eating habits.  Her activity is limited by knee pain but she is able to complete all light to moderate household activities, grocery shopping, and all other ADLs.  She wonders if she should have a repeat stress test however she is not having any chest pain, tightness, pressure or shortness of breath at rest or DOE.  She is concerned about the results of her ZIO monitor as she does not understand how she is only having rare PVCs when she "feels them all of the time 30 minutes after eating."  She wonders if there is something wrong with her stomach that is affecting her heart and if she needs to undergo further testing.  Discussed ZIO findings at length sharing with her ZIO strips that show her triggering the monitor right  after having a PVC and how her PVCs were mostly isolated occurrences. She reports she will sometimes have episodes when she has a PVC and then "it feels like my heart is just shaking and I am worried it won't beat right again." She reports these episodes "take my breath and I almost passed out one time." Discussed the possibility of an anxiety component to her symptoms but she does not think this is the problem. She states she has mitral valve prolapse and a murmur that has not been checked on in a long period of time. She denies lower extremity edema, orthopnea or PND. She underwent a sleep study in January and was told she had apneic events but she has not received the final results yet.     ROS: All other systems reviewed and are otherwise negative except as noted in History of Present Illness.  Studies Reviewed    ECG is not ordered today.         Physical Exam    VS:  BP 110/68   Pulse 64   Ht  (1.676 m)   Wt 192 lb 12.8 oz (87.5 kg)   LMP 09/17/2016 (Approximate)   SpO2 99%   BMI 31.12 kg/m  , BMI Body mass index is 31.12 kg/m.  GEN: Well nourished, well developed, in no acute distress. Neck: No JVD or carotid bruits. Cardiac:  RRR. No murmurs. No rubs or gallops.   Respiratory:  Respirations regular and unlabored. Clear to auscultation without rales, wheezing or rhonchi.  GI: Soft, nontender, nondistended. Extremities: Radials/DP/PT 2+ and equal bilaterally. No clubbing or cyanosis. No edema.  Skin: Warm and dry, no rash. Neuro: Strength intact.  Assessment & Plan   Palpitations/PVCs.  7-day ZIO February 2024 showed predominant underlying rhythm sinus rhythm without significant arrhythmias.  Patient reports continued palpitations after she eats. She also reports episodes of feeling a skip then fluttering in her chest. Went over zio report in detail and tried to give reassurance. Encouraged her to get results of sleep study. I do not feel it is appropriate to start a BB at  this time, as she is not having sustained episodes. She is in agreement to defer at this time.  Hypertriglyceridemia.  Patient had a nonfasting lipid panel drawn by her therapist which showed triglycerides 403.  She tried improving her diet by reducing carbohydrates and fried foods and increasing protein and whole foods but felt terrible so she has slowly gone back to the way she was eating prior.  Will get a fasting lipid panel today.  Will refer to healthy weight and wellness for assistance with dietary changes and reducing her weight. Mitral valve prolapse.  Patient reports she was told she has mitral valve prolapse and a murmur since she was young.  I do not auscultate a murmur today.  I cannot find record of an echo having been performed.  Patient very much wants her valve checked on.  Will get an echo.  Disposition: Fasting lipid panel and LFTs today.  Echo for MVP.  Refer to healthy weight and wellness.  Return in 6 months with Dr. Allyson Sabal or sooner as needed.         Signed, Danielle Grandchild. Nijee Heatwole, DNP, NP-C

## 2022-12-27 ENCOUNTER — Ambulatory Visit: Payer: 59 | Attending: Student | Admitting: Student

## 2022-12-27 ENCOUNTER — Encounter: Payer: Self-pay | Admitting: Student

## 2022-12-27 VITALS — BP 110/68 | HR 64 | Ht 66.0 in | Wt 192.8 lb

## 2022-12-27 DIAGNOSIS — R002 Palpitations: Secondary | ICD-10-CM

## 2022-12-27 DIAGNOSIS — G4733 Obstructive sleep apnea (adult) (pediatric): Secondary | ICD-10-CM | POA: Diagnosis not present

## 2022-12-27 DIAGNOSIS — E781 Pure hyperglyceridemia: Secondary | ICD-10-CM

## 2022-12-27 DIAGNOSIS — I493 Ventricular premature depolarization: Secondary | ICD-10-CM

## 2022-12-27 DIAGNOSIS — Z8679 Personal history of other diseases of the circulatory system: Secondary | ICD-10-CM | POA: Diagnosis not present

## 2022-12-27 NOTE — Patient Instructions (Signed)
Medication Instructions:  Your physician recommends that you continue on your current medications as directed. Please refer to the Current Medication list given to you today.  *If you need a refill on your cardiac medications before your next appointment, please call your pharmacy*   Lab Work: Your physician recommends that you have the following lab drawn: Lipid panel  If you have labs (blood work) drawn today and your tests are completely normal, you will receive your results only by: MyChart Message (if you have MyChart) OR A paper copy in the mail If you have any lab test that is abnormal or we need to change your treatment, we will call you to review the results.   Testing/Procedures: Your physician has requested that you have an echocardiogram. Echocardiography is a painless test that uses sound waves to create images of your heart. It provides your doctor with information about the size and shape of your heart and how well your heart's chambers and valves are working. This procedure takes approximately one hour. There are no restrictions for this procedure. Please do NOT wear cologne, perfume, aftershave, or lotions (deodorant is allowed). Please arrive 15 minutes prior to your appointment time.    Follow-Up: At [facility], you and your health needs are our priority.  As part of our continuing mission to provide you with exceptional heart care, we have created designated Provider Care Teams.  These Care Teams include your primary Cardiologist (physician) and Advanced Practice Providers (APPs -  Physician Assistants and Nurse Practitioners) who all work together to provide you with the care you need, when you need it.  We recommend signing up for the patient portal called "MyChart".  Sign up information is provided on this After Visit Summary.  MyChart is used to connect with patients for Virtual Visits (Telemedicine).  Patients are able to view lab/test results, encounter  notes, upcoming appointments, etc.  Non-urgent messages can be sent to your provider as well.   To learn more about what you can do with MyChart, go to [email or web address].    Your next appointment:   6 month(s)  Provider:   Carlos Levering, NP Or Nanetta Batty, MD

## 2022-12-28 LAB — LIPID PANEL
Chol/HDL Ratio: 2.9 ratio (ref 0.0–4.4)
Cholesterol, Total: 141 mg/dL (ref 100–199)
HDL: 48 mg/dL
LDL Chol Calc (NIH): 76 mg/dL (ref 0–99)
Triglycerides: 91 mg/dL (ref 0–149)
VLDL Cholesterol Cal: 17 mg/dL (ref 5–40)

## 2022-12-31 ENCOUNTER — Telehealth: Payer: Self-pay | Admitting: Primary Care

## 2022-12-31 NOTE — Telephone Encounter (Signed)
I do tell all my patients to call and schedule a follow-up after having sleep study to review. This was written on her AVS Her sleep study was negative for OSA (AHI = 2.3/h). Minimal to no oxygen desaturations. She did have moderate snoring. Can be referred to orthodontics for oral appliance to help with this.

## 2022-12-31 NOTE — Telephone Encounter (Signed)
Beth can you please advise on patients sleep study results?

## 2022-12-31 NOTE — Telephone Encounter (Signed)
Patient is calling because she had a sleep study in January and she said she has not heard from anyone regarding her results.  Please advise and call patient to discuss.  Patient would like to hear from some asap.  CB# [phone]

## 2022-12-31 NOTE — Telephone Encounter (Signed)
Spoke with patient went over Sleep study results. Patient would like to come and see Beth before deciding to move forward with oral appliance. Apt has been made for next Friday. nfn

## 2023-01-03 ENCOUNTER — Telehealth: Payer: Self-pay | Admitting: Cardiovascular Disease

## 2023-01-03 ENCOUNTER — Encounter: Payer: Self-pay | Admitting: Internal Medicine

## 2023-01-03 ENCOUNTER — Other Ambulatory Visit: Payer: Self-pay

## 2023-01-03 MED ORDER — METOPROLOL TARTRATE 25 MG PO TABS: 25.0000 mg | ORAL_TABLET | Freq: Two times a day (BID) | ORAL | 3 refills | Status: DC

## 2023-01-03 NOTE — Telephone Encounter (Signed)
*  STAT* If patient is at the pharmacy, call can be transferred to refill team.   1. Which medications need to be refilled? (please list name of each medication and dose if known) metoprolol tartrate (LOPRESSOR) 25 MG tablet   2. Which pharmacy/location (including street and city if local pharmacy) is medication to be sent to?  [id number] - [place], [place] - [address]    3. Do they need a 30 day or 90 day supply? 30 day  Pt is completely out of medication, hasn't taken one today and leaving to go out of town tomorrow.

## 2023-01-04 MED ORDER — METOPROLOL TARTRATE 25 MG PO TABS: 25.0000 mg | ORAL_TABLET | Freq: Two times a day (BID) | ORAL | 3 refills | Status: DC

## 2023-01-07 ENCOUNTER — Telehealth: Payer: Self-pay | Admitting: Cardiovascular Disease

## 2023-01-07 ENCOUNTER — Ambulatory Visit: Payer: Self-pay | Admitting: Primary Care

## 2023-01-07 ENCOUNTER — Telehealth: Payer: Self-pay | Admitting: Cardiology

## 2023-01-07 NOTE — Progress Notes (Deleted)
@Patient  ID: [name], female    DOB: 15-Jul-1961, 62 y.o.   MRN: [id number]  No chief complaint on file.   Referring provider: Georgina Quint, *  HPI:  62 year old female, never smoked.  Past medical history significant for X asthma supraventricular tachycardia, sleep apnea, chronic sinusitis, IBS, multinodular goiter..  07/28/2022 Patient presents today for sleep consult.  She has symptoms of snoring and disrupted sleep. She reports waking up frequently throughout the night. Associated daytime sleepiness.  She had a sleep study in early 2000s, she tells me she had sleep apnea but had a hard time tolerating CPAP. Results are not available in chart.Typical bedtime is 11 PM.  It takes her 15 minutes to fall asleep.  She wakes up on average 3 times a night.  She starts her day at 8:30 AM.  Her weight is down 10 pounds.  She does not wear CPAP or oxygen.  Epworth score 4.  Denies symptoms of narcolepsy, cataplexy or sleep walking.  01/07/2023- Interim hx  Patient presents today to review sleep study results. She has symptoms of snoring and disrupted sleep. Patient had NPSG on 10/12/22 which was negative for OSA (AHI = 2.3/h). AHI during REM sleep was 5.2/hour. Minimal to no oxygen desaturations. SpO2 low 90% (average 94%). She did have moderate snoring. Can be referred to orthodontics for oral appliance to help with this.     Allergies  Allergen Reactions   Doxycycline Rash    Immunization History  Administered Date(s) Administered   Influenza,inj,Quad PF,6+ Mos 06/13/2014   PFIZER(Purple Top)SARS-COV-2 Vaccination 02/26/2020, 03/25/2020   Tdap 09/14/2007, 07/17/2020    Past Medical History:  Diagnosis Date   Anemia    Anxiety    Arrhythmia 2013   Colon polyp 08/28/2012   Tubular adenoma   Dense breast    Depression    Genital warts    GERD (gastroesophageal reflux disease)    Heart murmur    Hemorrhoids    Hx of cardiac catheterization 2009   normal  coronary arteries   Hx of menorrhagia    Novasure   Hypokalemia    Hypomagnesemia    Hypophosphatemia    Lactose intolerance in adult    Meniscus degeneration, right    torn r knee/ 10/2019   Mitral valve prolapse    Normal cardiac stress test 03/09/2012   no ischemia   Positive PPD    Rapid heart beat    States "irreg heart beat"   Sleep apnea    Thyroid disease    Thyroid nodules    Tobacco History: Social History   Tobacco Use  Smoking Status Never  Smokeless Tobacco Never   Counseling given: Not Answered   Outpatient Medications Prior to Visit  Medication Sig Dispense Refill   Ascorbic Acid (VITAMIN C) 1000 MG tablet Take 2,000 mg by mouth at bedtime.     aspirin EC 81 MG EC tablet Take 1 tablet (81 mg total) by mouth daily.     Calcium Carb-Cholecalciferol (OYSTER SHELL CALCIUM 250+D PO) Take 2 tablets by mouth at bedtime.     Cholecalciferol (VITAMIN D) 50 MCG (2000 UT) tablet Take 4,000 Units by mouth at bedtime.      dicyclomine (BENTYL) 10 MG capsule Take 1 capsule (10 mg total) by mouth 3 (three) times daily as needed for spasms. 30 capsule 0   losartan (COZAAR) 50 MG tablet Take 25 mg by mouth 2 (two) times daily.     metoprolol tartrate (LOPRESSOR)  25 MG tablet Take 1 tablet (25 mg total) by mouth 2 (two) times daily. 180 tablet 3   Multiple Vitamins-Minerals (ZINC PO) Take 1 tablet by mouth at bedtime.     pantoprazole (PROTONIX) 40 MG tablet Take 1 tablet (40 mg total) by mouth daily. 30 tablet 3   PARoxetine (PAXIL-CR) 25 MG 24 hr tablet Take 25 mg by mouth daily.     Polyethyl Glycol-Propyl Glycol (SYSTANE OP) Place 2 drops into both eyes daily.     potassium chloride SA (KLOR-CON M20) 20 MEQ tablet Take 2 tablets (40 mEq total) by mouth daily. TAKE 2 TABLETS BY MOUTH EVERY DAY 180 tablet 3   TURMERIC PO Take 1 capsule by mouth daily as needed (pain/inflammation).     No facility-administered medications prior to visit.      Review of  Systems  Review of Systems   Physical Exam  LMP 09/17/2016 (Approximate)  Physical Exam   Lab Results:  CBC    Component Value Date/Time   WBC 4.9 08/26/2022 1624   RBC 5.30 (H) 08/26/2022 1624   HGB 12.6 08/26/2022 1624   HGB 13.4 05/25/2018 1015   HCT 40.3 08/26/2022 1624   HCT 43.0 05/25/2018 1015   PLT 193.0 08/26/2022 1624   PLT 228 05/25/2018 1015   MCV 76.0 (L) 08/26/2022 1624   MCV 76.4 01/17/2020 1708   MCV 77 (L) 05/25/2018 1015   MCH 23.4 (L) 08/24/2022 2316   MCHC 31.2 08/26/2022 1624   RDW 14.8 08/26/2022 1624   RDW 14.7 05/25/2018 1015   LYMPHSABS 1.2 08/26/2022 1624   LYMPHSABS 1.2 05/25/2018 1015   MONOABS 0.9 08/26/2022 1624   EOSABS 0.1 08/26/2022 1624   EOSABS 0.1 05/25/2018 1015   BASOSABS 0.0 08/26/2022 1624   BASOSABS 0.0 05/25/2018 1015    BMET    Component Value Date/Time   NA 142 08/26/2022 1624   NA 142 09/23/2020 1113   K 3.7 08/26/2022 1624   CL 108 08/26/2022 1624   CO2 33 (H) 08/26/2022 1624   GLUCOSE 87 08/26/2022 1624   BUN 5 (L) 08/26/2022 1624   BUN 10 09/23/2020 1113   CREATININE 0.71 08/26/2022 1624   CALCIUM 8.6 08/26/2022 1624   CALCIUM 9.1 07/04/2012 1420   GFRNONAA >60 08/24/2022 2316   GFRAA 93 09/23/2020 1113    BNP    Component Value Date/Time   BNP 12.5 07/25/2021 2025    ProBNP    Component Value Date/Time   PROBNP 14.3 01/21/2013 2230    Imaging: No results found.   Assessment & Plan:   No problem-specific Assessment & Plan notes found for this encounter.     Glenford Bayley, NP 01/07/2023

## 2023-01-07 NOTE — Telephone Encounter (Signed)
I contacted patient a second time, and was able to get in touch with her. Patient reports that she had been taking metoprolol succinate 25 mg BID, but we refilled metoprolol tartrate 25 mg BID today. Per review of her med list, it does not appear that metoprolol succinate is on her list.    Metoprolol tartrate 25 mg BID is on her list and was refilled today. Discussed with patient that metoprolol tartrate is a short-acting form of metoprolol, and it is typically taken twice daily. Metoprolol succinate is an extended release medication that is usually taken once daily.   At this time, patient is willing to take metoprolol tartrate.   Jonita Albee, PA-C 01/07/2023 6:15 PM

## 2023-01-07 NOTE — Telephone Encounter (Signed)
Patient states she is supposed to be on metoprolol succinate 25 mg.  She is adamant that it is sent in.  Advised I cannot send as it is not listed on her medl ist or last OV note.  She wants me to go back through her chart from when she first came her.  Advised even if seen, I would have to verify with physician.  I advised I would send the message and if the provider sees this tonight or weekend , he may send it for her.  She is adamant she wants it now.  At this time she says she will call the on call doctor and disconnect.

## 2023-01-07 NOTE — Telephone Encounter (Signed)
I received a page from the answering service that patient called and said that the wrong medication script was sent to her pharmacy. I attempted to call patient back at 5:30 PM, but she did not answer.   Jonita Albee, PA-C 01/07/2023 5:35 PM

## 2023-01-07 NOTE — Telephone Encounter (Signed)
Pt c/o medication issue:  1. Name of Medication:    2. How are you currently taking this medication (dosage and times per day)?    3. Are you having a reaction (difficulty breathing--STAT)? no  4. What is your medication issue? Patient states that this medication should be on her current medication list. Stated that this is the mediatation she dont take anymore metoprolol tartrate (LOPRESSOR) 25 MG tablet . Ask that a refill be sent in for the above medication. She is completely out. Please advise

## 2023-01-07 NOTE — Telephone Encounter (Signed)
Please send it to [facility]

## 2023-01-10 ENCOUNTER — Encounter: Payer: Self-pay | Admitting: Internal Medicine

## 2023-01-10 ENCOUNTER — Ambulatory Visit (AMBULATORY_SURGERY_CENTER): Payer: 59 | Admitting: Internal Medicine

## 2023-01-10 VITALS — BP 91/52 | HR 65 | Temp 98.2°F | Resp 8 | Ht 66.0 in | Wt 189.0 lb

## 2023-01-10 DIAGNOSIS — G4733 Obstructive sleep apnea (adult) (pediatric): Secondary | ICD-10-CM | POA: Diagnosis not present

## 2023-01-10 DIAGNOSIS — Z860101 Personal history of adenomatous and serrated colon polyps: Secondary | ICD-10-CM

## 2023-01-10 DIAGNOSIS — Z86010 Personal history of colonic polyps: Secondary | ICD-10-CM | POA: Diagnosis not present

## 2023-01-10 DIAGNOSIS — Z09 Encounter for follow-up examination after completed treatment for conditions other than malignant neoplasm: Secondary | ICD-10-CM

## 2023-01-10 DIAGNOSIS — K219 Gastro-esophageal reflux disease without esophagitis: Secondary | ICD-10-CM | POA: Diagnosis not present

## 2023-01-10 DIAGNOSIS — Z1211 Encounter for screening for malignant neoplasm of colon: Secondary | ICD-10-CM | POA: Diagnosis not present

## 2023-01-10 DIAGNOSIS — D123 Benign neoplasm of transverse colon: Secondary | ICD-10-CM

## 2023-01-10 MED ORDER — SODIUM CHLORIDE 0.9 % IV SOLN
500.0000 mL | Freq: Once | INTRAVENOUS | Status: DC
Start: 2023-01-10 — End: 2023-01-10

## 2023-01-10 NOTE — Op Note (Signed)
[facility] Patient Name: [name] Procedure Date: 01/10/2023 1:20 PM MRN: [id number] Endoscopist: Beverley Fiedler , MD, [id number] Age: 62 Referring MD:  Date of Birth: 02-Feb-1961 Gender: Female Account #: [id number] Procedure:                Colonoscopy Indications:              High risk colon cancer surveillance: Personal                            history of sessile serrated colon polyp (less than                            10 mm in size) with no dysplasia, Last colonoscopy:                            January 2019 Medicines:                Monitored Anesthesia Care Procedure:                Pre-Anesthesia Assessment:                           - Prior to the procedure, a History and Physical                            was performed, and patient medications and                            allergies were reviewed. The patient's tolerance of                            previous anesthesia was also reviewed. The risks                            and benefits of the procedure and the sedation                            options and risks were discussed with the patient.                            All questions were answered, and informed consent                            was obtained. Prior Anticoagulants: The patient has                            taken no anticoagulant or antiplatelet agents. ASA                            Grade Assessment: II - A patient with mild systemic                            disease. After reviewing the risks and benefits,  the patient was deemed in satisfactory condition to                            undergo the procedure.                           After obtaining informed consent, the colonoscope                            was passed under direct vision. Throughout the                            procedure, the patient's blood pressure, pulse, and                            oxygen saturations were monitored continuously.  The                            [id number]-[id number] Colonoscope [id number] was introduced                            through the anus and advanced to the cecum,                            identified by appendiceal orifice and ileocecal                            valve. The colonoscopy was performed without                            difficulty. The patient tolerated the procedure                            well. The quality of the bowel preparation was                            good. The ileocecal valve, appendiceal orifice, and                            rectum were photographed. Scope In: 1:30:13 PM Scope Out: 1:43:33 PM Scope Withdrawal Time: 0 hours 9 minutes 23 seconds  Total Procedure Duration: 0 hours 13 minutes 20 seconds  Findings:                 The digital rectal exam was normal.                           A 5 mm polyp was found in the splenic flexure. The                            polyp was sessile. The polyp was removed with a                            cold snare. Resection and retrieval were complete.  Internal hemorrhoids were found during                            retroflexion. The hemorrhoids were small.                           The exam was otherwise without abnormality. Complications:            No immediate complications. Estimated Blood Loss:     Estimated blood loss: none. Impression:               - One 5 mm polyp at the splenic flexure, removed                            with a cold snare. Resected and retrieved.                           - Small internal hemorrhoids.                           - The examination was otherwise normal. Recommendation:           - Patient has a contact number available for                            emergencies. The signs and symptoms of potential                            delayed complications were discussed with the                            patient. Return to normal activities tomorrow.                             Written discharge instructions were provided to the                            patient.                           - Resume previous diet.                           - Continue present medications.                           - Await pathology results.                           - Repeat colonoscopy is recommended for                            surveillance. The colonoscopy date will be                            determined after pathology results from today's  exam become available for review. Beverley Fiedler, MD 01/10/2023 1:48:40 PM This report has been signed electronically.

## 2023-01-10 NOTE — Progress Notes (Signed)
Called to room to assist during endoscopic procedure.  Patient ID and intended procedure confirmed with present staff. Received instructions for my participation in the procedure from the performing physician.  

## 2023-01-10 NOTE — Progress Notes (Signed)
Pt resting comfortably. VSS. Airway intact. SBAR complete to RN. All questions answered.   

## 2023-01-10 NOTE — Patient Instructions (Signed)
Resume all of your previous medications as ordered.   YOU HAD AN ENDOSCOPIC PROCEDURE TODAY AT THE [facility]:   Refer to the procedure report that was given to you for any specific questions about what was found during the examination.  If the procedure report does not answer your questions, please call your gastroenterologist to clarify.  If you requested that your care partner not be given the details of your procedure findings, then the procedure report has been included in a sealed envelope for you to review at your convenience later.  YOU SHOULD EXPECT: Some feelings of bloating in the abdomen. Passage of more gas than usual.  Walking can help get rid of the air that was put into your GI tract during the procedure and reduce the bloating. If you had a lower endoscopy (such as a colonoscopy or flexible sigmoidoscopy) you may notice spotting of blood in your stool or on the toilet paper. If you underwent a bowel prep for your procedure, you may not have a normal bowel movement for a few days.  Please Note:  You might notice some irritation and congestion in your nose or some drainage.  This is from the oxygen used during your procedure.  There is no need for concern and it should clear up in a day or so.  SYMPTOMS TO REPORT IMMEDIATELY:  Following lower endoscopy (colonoscopy or flexible sigmoidoscopy):  Excessive amounts of blood in the stool  Significant tenderness or worsening of abdominal pains  Swelling of the abdomen that is new, acute  Fever of 100F or higher   For urgent or emergent issues, a gastroenterologist can be reached at any hour by calling [phone]. Do not use MyChart messaging for urgent concerns.    DIET:  We do recommend a small meal at first, but then you may proceed to your regular diet.  Drink plenty of fluids but you should avoid alcoholic beverages for 24 hours.  ACTIVITY:  You should plan to take it easy for the rest of today and you should  NOT DRIVE or use heavy machinery until tomorrow (because of the sedation medicines used during the test).    FOLLOW UP: Our staff will call the number listed on your records the next business day following your procedure.  We will call around 7:15- 8:00 am to check on you and address any questions or concerns that you may have regarding the information given to you following your procedure. If we do not reach you, we will leave a message.     If any biopsies were taken you will be contacted by phone or by letter within the next 1-3 weeks.  Please call us at [phone] if you have not heard about the biopsies in 3 weeks.    SIGNATURES/CONFIDENTIALITY: You and/or your care partner have signed paperwork which will be entered into your electronic medical record.  These signatures attest to the fact that that the information above on your After Visit Summary has been reviewed and is understood.  Full responsibility of the confidentiality of this discharge information lies with you and/or your care-partner. 

## 2023-01-10 NOTE — Progress Notes (Signed)
GASTROENTEROLOGY PROCEDURE H&P NOTE   Primary Care Physician: Georgina Quint, MD    Reason for Procedure:   Hx of colonic polyps  Plan:    colonoscopy  Patient is appropriate for endoscopic procedure(s) in the ambulatory ([facility]) setting.  The nature of the procedure, as well as the risks, benefits, and alternatives were carefully and thoroughly reviewed with the patient. Ample time for discussion and questions allowed. The patient understood, was satisfied, and agreed to proceed.     HPI: [name] is a 62 y.o. female who presents for surveillance colonoscopy.  Medical history as below.  Tolerated the prep.  No recent chest pain or shortness of breath.  No abdominal pain today.  Past Medical History:  Diagnosis Date   Anemia    Anxiety    Arrhythmia 2013   Colon polyp 08/28/2012   Tubular adenoma   Dense breast    Depression    Genital warts    GERD (gastroesophageal reflux disease)    Heart murmur    Hemorrhoids    Hx of cardiac catheterization 2009   normal coronary arteries   Hx of menorrhagia    Novasure   Hypokalemia    Hypomagnesemia    Hypophosphatemia    Lactose intolerance in adult    Meniscus degeneration, right    torn r knee/ 10/2019   Mitral valve prolapse    Normal cardiac stress test 03/09/2012   no ischemia   Positive PPD    Rapid heart beat    States "irreg heart beat"   Sleep apnea    Thyroid disease    Thyroid nodules    Past Surgical History:  Procedure Laterality Date   CARDIAC CATHETERIZATION     x2 with normal results per pt   NOVASURE ABLATION     UPPER GASTROINTESTINAL ENDOSCOPY  May 2013    Prior to Admission medications   Medication Sig Start Date End Date Taking? Authorizing Provider  Ascorbic Acid (VITAMIN C) 1000 MG tablet Take 2,000 mg by mouth at bedtime.   Yes [provider]  aspirin EC 81 MG EC tablet Take 1 tablet (81 mg total) by mouth daily. 10/21/19  Yes Orpah Cobb, MD  Calcium  Carb-Cholecalciferol (OYSTER SHELL CALCIUM 250+D PO) Take 2 tablets by mouth at bedtime.   Yes [provider]  Cholecalciferol (VITAMIN D) 50 MCG (2000 UT) tablet Take 4,000 Units by mouth at bedtime.    Yes [provider]  dicyclomine (BENTYL) 10 MG capsule Take 1 capsule (10 mg total) by mouth 3 (three) times daily as needed for spasms. 08/26/22  Yes Henson, Vickie L, NP-C  losartan (COZAAR) 50 MG tablet Take 25 mg by mouth 2 (two) times daily.   Yes [provider]  metoprolol tartrate (LOPRESSOR) 25 MG tablet Take 1 tablet (25 mg total) by mouth 2 (two) times daily. 01/04/23  Yes Runell Gess, MD  PARoxetine (PAXIL-CR) 25 MG 24 hr tablet Take 25 mg by mouth daily.   Yes [provider]  potassium chloride SA (KLOR-CON M20) 20 MEQ tablet Take 2 tablets (40 mEq total) by mouth daily. TAKE 2 TABLETS BY MOUTH EVERY DAY 02/02/22 01/28/23 Yes Sagardia, Eilleen Kempf, MD  Multiple Vitamins-Minerals (ZINC PO) Take 1 tablet by mouth at bedtime. Patient not taking: Reported on 01/10/2023    [provider]  pantoprazole (PROTONIX) 40 MG tablet Take 1 tablet (40 mg total) by mouth daily. Patient not taking: Reported on 01/10/2023 11/08/22  Georgina Quint, MD  Polyethyl Glycol-Propyl Glycol (SYSTANE OP) Place 2 drops into both eyes daily. Patient not taking: Reported on 01/10/2023    [provider]  TURMERIC PO Take 1 capsule by mouth daily as needed (pain/inflammation). Patient not taking: Reported on 01/10/2023    [provider]    Current Outpatient Medications  Medication Sig Dispense Refill   Ascorbic Acid (VITAMIN C) 1000 MG tablet Take 2,000 mg by mouth at bedtime.     aspirin EC 81 MG EC tablet Take 1 tablet (81 mg total) by mouth daily.     Calcium Carb-Cholecalciferol (OYSTER SHELL CALCIUM 250+D PO) Take 2 tablets by mouth at bedtime.     Cholecalciferol (VITAMIN D) 50 MCG (2000 UT) tablet Take 4,000 Units by mouth at  bedtime.      dicyclomine (BENTYL) 10 MG capsule Take 1 capsule (10 mg total) by mouth 3 (three) times daily as needed for spasms. 30 capsule 0   losartan (COZAAR) 50 MG tablet Take 25 mg by mouth 2 (two) times daily.     metoprolol tartrate (LOPRESSOR) 25 MG tablet Take 1 tablet (25 mg total) by mouth 2 (two) times daily. 180 tablet 3   PARoxetine (PAXIL-CR) 25 MG 24 hr tablet Take 25 mg by mouth daily.     potassium chloride SA (KLOR-CON M20) 20 MEQ tablet Take 2 tablets (40 mEq total) by mouth daily. TAKE 2 TABLETS BY MOUTH EVERY DAY 180 tablet 3   Multiple Vitamins-Minerals (ZINC PO) Take 1 tablet by mouth at bedtime. (Patient not taking: Reported on 01/10/2023)     pantoprazole (PROTONIX) 40 MG tablet Take 1 tablet (40 mg total) by mouth daily. (Patient not taking: Reported on 01/10/2023) 30 tablet 3   Polyethyl Glycol-Propyl Glycol (SYSTANE OP) Place 2 drops into both eyes daily. (Patient not taking: Reported on 01/10/2023)     TURMERIC PO Take 1 capsule by mouth daily as needed (pain/inflammation). (Patient not taking: Reported on 01/10/2023)     Current Facility-Administered Medications  Medication Dose Route Frequency Provider Last Rate Last Admin   0.9 %  sodium chloride infusion  500 mL Intravenous Once Shaddai Shapley, Carie Caddy, MD        Allergies as of 01/10/2023 - Review Complete 01/10/2023  Allergen Reaction Noted   Doxycycline Rash     Family History  Problem Relation Age of Onset   Heart disease Father        died Mi 34   Diabetes Maternal Grandmother    Hypertension Maternal Grandfather    Diabetes Maternal Grandfather    Heart disease Maternal Grandfather    Colon cancer Maternal Grandfather    Rectal cancer Maternal Grandfather    Prostate cancer Neg Hx    Breast cancer Neg Hx    Colon polyps Neg Hx    Esophageal cancer Neg Hx     Social History   Socioeconomic History   Marital status: Divorced    Spouse name: Not on file   Number of children: 2   Years of education:  16   Highest education level: Not on file  Occupational History   Occupation: dialysis Theme park manager: [employer]  Tobacco Use   Smoking status: Never   Smokeless tobacco: Never  Vaping Use   Vaping Use: Never used  Substance and Sexual Activity   Alcohol use: No   Drug use: No   Sexual activity: Yes    Birth control/protection: Post-menopausal  Other Topics  Concern   Not on file  Social History Narrative   Ms. Que is a divorced [place] American female who works as a Teacher, early years/pre who has a number of specialists but no primary care physician. She lived in [place] and in [place] for a number of years her mom is from [place]. 16 years of education went to college at [employer] lives at home with her son who is in his 48s no pets   Neg ets tob etoh hx PA   6 hours of sleep   G2P2    TD2010  colonoscopy 2009   Now running  own business    HH of 2    Left handed    Caffeine use: tea sometimes         Social Determinants of Health   Financial Resource Strain: Not on file  Food Insecurity: Not on file  Transportation Needs: Not on file  Physical Activity: Not on file  Stress: Not on file  Social Connections: Not on file  Intimate Partner Violence: Not on file    Physical Exam: Vital signs in last 24 hours: @BP  111/77   Pulse (!) 57   Temp 98.2 F (36.8 C) (Skin)   Resp 14   Ht 5\' 6"  (1.676 m)   Wt 189 lb (85.7 kg)   LMP 09/17/2016 (Approximate)   SpO2 100%   BMI 30.51 kg/m  GEN: NAD EYE: Sclerae anicteric ENT: MMM CV: Non-tachycardic Pulm: CTA b/l GI: Soft, NT/ND NEURO:  Alert & Oriented x 3   Erick Blinks, MD [facility]  01/10/2023 1:21 PM

## 2023-01-10 NOTE — Progress Notes (Signed)
Pt's states no medical or surgical changes since previsit or office visit. VS assessed by C.W 

## 2023-01-10 NOTE — Telephone Encounter (Signed)
Returned call to patient and made her aware- please see other telephone note. Nothing else needed at this time.   Advised patient to call back to office with any issues, questions, or concerns. Patient verbalized understanding.

## 2023-01-11 ENCOUNTER — Telehealth: Payer: Self-pay | Admitting: *Deleted

## 2023-01-11 NOTE — Telephone Encounter (Signed)
  Follow up Call-    Row Labels 01/10/2023   12:35 PM  Call back number   Section Header. No data exists in this row.   Post procedure Call Back phone  #   [phone]  Permission to leave phone message   Yes     Patient questions:  Do you have a fever, pain , or abdominal swelling? No. Pain Score  0 *  Have you tolerated food without any problems? Yes.    Have you been able to return to your normal activities? Yes.    Do you have any questions about your discharge instructions: Diet   No. Medications  No. Follow up visit  No.  Do you have questions or concerns about your Care? No.  Actions: * If pain score is 4 or above: No action needed, pain <4.

## 2023-01-13 ENCOUNTER — Encounter: Payer: Self-pay | Admitting: Internal Medicine

## 2023-01-24 ENCOUNTER — Encounter (INDEPENDENT_AMBULATORY_CARE_PROVIDER_SITE_OTHER): Payer: 59 | Admitting: Family Medicine

## 2023-01-24 ENCOUNTER — Ambulatory Visit (HOSPITAL_COMMUNITY): Payer: 59 | Attending: Student

## 2023-01-24 DIAGNOSIS — I083 Combined rheumatic disorders of mitral, aortic and tricuspid valves: Secondary | ICD-10-CM | POA: Diagnosis not present

## 2023-01-24 DIAGNOSIS — I517 Cardiomegaly: Secondary | ICD-10-CM

## 2023-01-24 DIAGNOSIS — Z8679 Personal history of other diseases of the circulatory system: Secondary | ICD-10-CM

## 2023-01-24 DIAGNOSIS — I361 Nonrheumatic tricuspid (valve) insufficiency: Secondary | ICD-10-CM

## 2023-01-24 LAB — ECHOCARDIOGRAM COMPLETE
Area-P 1/2: 3.31 cm2
S' Lateral: 2.8 cm

## 2023-01-25 ENCOUNTER — Telehealth: Payer: Self-pay | Admitting: Cardiovascular Disease

## 2023-01-25 NOTE — Telephone Encounter (Signed)
Patient is calling to follow-up on Echo results. 

## 2023-01-25 NOTE — Telephone Encounter (Signed)
Please review echo and result please.  She states had PVC and felt real weak. So asking for result to see if this is cause and recommendations

## 2023-01-26 ENCOUNTER — Telehealth: Payer: Self-pay | Admitting: Cardiovascular Disease

## 2023-01-26 NOTE — Telephone Encounter (Signed)
I may have talked to her about [employer] mobile. I can't remember if she had a smart watch.   Thank you!  DW

## 2023-01-26 NOTE — Telephone Encounter (Signed)
  Patient states that at her last visit with Carlos Levering, there was a mention of an APP that she could download on her phone to track her heart. She doesn't remember what the name of it was. Could someone please let her know.

## 2023-01-26 NOTE — Telephone Encounter (Signed)
Called patient with results, she states "something is going on"  She doesn't know what to do. She states the bottom of her stomach hurt, she was weak and dizzy.  Repeats the issues and I do state understanding and that an appt can be scheduled.  She discusses stomach issues and I suggest GI and she states already having colonoscopy and that she may need endoscopy too.  Advised that she could speak further to PCP for that issue.  She ask again what is going on and advised I could set an appt. For her.  She states yes that is what she wants because she needs to know what is happening.  Sent to scheduling to call her to get appt.

## 2023-01-26 NOTE — Telephone Encounter (Signed)
Patient will download [facility] and she stated she will be getting her a smart watch

## 2023-01-27 DIAGNOSIS — M62838 Other muscle spasm: Secondary | ICD-10-CM | POA: Diagnosis not present

## 2023-01-27 DIAGNOSIS — M9903 Segmental and somatic dysfunction of lumbar region: Secondary | ICD-10-CM | POA: Diagnosis not present

## 2023-01-27 DIAGNOSIS — M546 Pain in thoracic spine: Secondary | ICD-10-CM | POA: Diagnosis not present

## 2023-01-27 DIAGNOSIS — M9901 Segmental and somatic dysfunction of cervical region: Secondary | ICD-10-CM | POA: Diagnosis not present

## 2023-01-27 DIAGNOSIS — M545 Low back pain, unspecified: Secondary | ICD-10-CM | POA: Diagnosis not present

## 2023-01-27 DIAGNOSIS — M542 Cervicalgia: Secondary | ICD-10-CM | POA: Diagnosis not present

## 2023-01-27 DIAGNOSIS — M9902 Segmental and somatic dysfunction of thoracic region: Secondary | ICD-10-CM | POA: Diagnosis not present

## 2023-01-27 DIAGNOSIS — M62830 Muscle spasm of back: Secondary | ICD-10-CM | POA: Diagnosis not present

## 2023-01-31 ENCOUNTER — Encounter: Payer: Self-pay | Admitting: Primary Care

## 2023-01-31 ENCOUNTER — Ambulatory Visit: Payer: 59 | Admitting: Primary Care

## 2023-01-31 VITALS — BP 100/64 | HR 78 | Temp 97.8°F | Ht 66.0 in | Wt 195.4 lb

## 2023-01-31 DIAGNOSIS — G4730 Sleep apnea, unspecified: Secondary | ICD-10-CM | POA: Diagnosis not present

## 2023-01-31 DIAGNOSIS — M25561 Pain in right knee: Secondary | ICD-10-CM | POA: Diagnosis not present

## 2023-01-31 NOTE — Progress Notes (Signed)
@Patient  ID: [name], female    DOB: 01-23-1961, 62 y.o.   MRN: [id number]  Chief Complaint  Patient presents with   Follow-up    Review sleep study 10/12/2022    Referring provider: Georgina Quint, *  HPI: 62 year old female, never smoked.  Past medical history significant for asthma supraventricular tachycardia, sleep apnea, chronic sinusitis, IBS, multinodular goiter.  Previous [facility] pulmonary encounter: 07/28/2022 Patient presents today for sleep consult.  She has symptoms of snoring and disrupted sleep. She reports waking up frequently throughout the night. Associated daytime sleepiness.  She had a sleep study in early 2000s, she tells me she had sleep apnea but had a hard time tolerating CPAP. Results are not available in chart.Typical bedtime is 11 PM.  It takes her 15 minutes to fall asleep.  She wakes up on average 3 times a night.  She starts her day at 8:30 AM.  Her weight is down 10 pounds.  She does not wear CPAP or oxygen.  Epworth score 4.  Denies symptoms of narcolepsy, cataplexy or sleep walking.  01/31/2023- Interim hx  Patient presents today to review sleep study results. She has symptoms of snoring and disrupted sleep. Patient had NPSG on 10/12/22 which was negative for OSA (AHI = 2.3/h). AHI during REM sleep was 5.2/hour. Minimal to no oxygen desaturations. SpO2 low 90% (average 94%). She did have moderate snoring.  We reviewed sleep study results today.  She did not have frequent enough events to meet diagnostic criteria for obstructive sleep apnea.  No significant hypoxemia.  We discussed treatment options for snoring including weight loss, positional sleep and/or referral to orthodontic dentist for oral appliance.  At this time she would like to hold off on being fitted for oral device.  She will focus on side sleeping position and weight loss.  Feels her sleep is disrupted due to neck pain, she has an appointment with a specialist today.    Allergies   Allergen Reactions   Doxycycline Rash    Immunization History  Administered Date(s) Administered   Influenza,inj,Quad PF,6+ Mos 06/13/2014   PFIZER(Purple Top)SARS-COV-2 Vaccination 02/26/2020, 03/25/2020   Tdap 09/14/2007, 07/17/2020    Past Medical History:  Diagnosis Date   Anemia    Anxiety    Arrhythmia 2013   Colon polyp 08/28/2012   Tubular adenoma   Dense breast    Depression    Genital warts    GERD (gastroesophageal reflux disease)    Heart murmur    Hemorrhoids    Hx of cardiac catheterization 2009   normal coronary arteries   Hx of menorrhagia    Novasure   Hypokalemia    Hypomagnesemia    Hypophosphatemia    Lactose intolerance in adult    Meniscus degeneration, right    torn r knee/ 10/2019   Mitral valve prolapse    Normal cardiac stress test 03/09/2012   no ischemia   Positive PPD    Rapid heart beat    States "irreg heart beat"   Sleep apnea    Thyroid disease    Thyroid nodules    Tobacco History: Social History   Tobacco Use  Smoking Status Never  Smokeless Tobacco Never   Counseling given: Not Answered   Outpatient Medications Prior to Visit  Medication Sig Dispense Refill   Ascorbic Acid (VITAMIN C) 1000 MG tablet Take 2,000 mg by mouth at bedtime.     aspirin EC 81 MG EC tablet Take 1 tablet (81 mg  total) by mouth daily.     Calcium Carb-Cholecalciferol (OYSTER SHELL CALCIUM 250+D PO) Take 2 tablets by mouth at bedtime.     Cholecalciferol (VITAMIN D) 50 MCG (2000 UT) tablet Take 4,000 Units by mouth at bedtime.      dicyclomine (BENTYL) 10 MG capsule Take 1 capsule (10 mg total) by mouth 3 (three) times daily as needed for spasms. 30 capsule 0   losartan (COZAAR) 50 MG tablet Take 25 mg by mouth 2 (two) times daily.     metoprolol tartrate (LOPRESSOR) 25 MG tablet Take 1 tablet (25 mg total) by mouth 2 (two) times daily. 180 tablet 3   Multiple Vitamins-Minerals (ZINC PO) Take 1 tablet by mouth at bedtime.     pantoprazole  (PROTONIX) 40 MG tablet Take 1 tablet (40 mg total) by mouth daily. 30 tablet 3   PARoxetine (PAXIL-CR) 25 MG 24 hr tablet Take 25 mg by mouth daily.     Polyethyl Glycol-Propyl Glycol (SYSTANE OP) Place 2 drops into both eyes daily.     TURMERIC PO Take 1 capsule by mouth daily as needed (pain/inflammation).     potassium chloride SA (KLOR-CON M20) 20 MEQ tablet Take 2 tablets (40 mEq total) by mouth daily. TAKE 2 TABLETS BY MOUTH EVERY DAY 180 tablet 3   No facility-administered medications prior to visit.    Review of Systems  Review of Systems  Constitutional: Negative.   Respiratory: Negative.    Cardiovascular: Negative.   Musculoskeletal:  Positive for neck pain.  Psychiatric/Behavioral:  Positive for sleep disturbance.      Physical Exam  BP 100/64 (BP Location: Left Arm, Patient Position: Sitting, Cuff Size: Normal)   Pulse 78   Temp 97.8 F (36.6 C) (Oral)   Ht 5\' 6"  (1.676 m)   Wt 195 lb 6.4 oz (88.6 kg)   LMP 09/17/2016 (Approximate)   SpO2 100%   BMI 31.54 kg/m  Physical Exam Constitutional:      Appearance: Normal appearance.  HENT:     Head: Normocephalic and atraumatic.  Cardiovascular:     Rate and Rhythm: Normal rate and regular rhythm.     Comments: RRR Pulmonary:     Effort: Pulmonary effort is normal.     Breath sounds: Normal breath sounds.  Skin:    General: Skin is warm and dry.  Neurological:     General: No focal deficit present.     Mental Status: She is alert and oriented to person, place, and time. Mental status is at baseline.  Psychiatric:        Mood and Affect: Mood normal.        Behavior: Behavior normal.        Thought Content: Thought content normal.        Judgment: Judgment normal.      Lab Results:  CBC    Component Value Date/Time   WBC 4.9 08/26/2022 1624   RBC 5.30 (H) 08/26/2022 1624   HGB 12.6 08/26/2022 1624   HGB 13.4 05/25/2018 1015   HCT 40.3 08/26/2022 1624   HCT 43.0 05/25/2018 1015   PLT 193.0  08/26/2022 1624   PLT 228 05/25/2018 1015   MCV 76.0 (L) 08/26/2022 1624   MCV 76.4 01/17/2020 1708   MCV 77 (L) 05/25/2018 1015   MCH 23.4 (L) 08/24/2022 2316   MCHC 31.2 08/26/2022 1624   RDW 14.8 08/26/2022 1624   RDW 14.7 05/25/2018 1015   LYMPHSABS 1.2 08/26/2022 1624   LYMPHSABS 1.2  05/25/2018 1015   MONOABS 0.9 08/26/2022 1624   EOSABS 0.1 08/26/2022 1624   EOSABS 0.1 05/25/2018 1015   BASOSABS 0.0 08/26/2022 1624   BASOSABS 0.0 05/25/2018 1015    BMET    Component Value Date/Time   NA 142 08/26/2022 1624   NA 142 09/23/2020 1113   K 3.7 08/26/2022 1624   CL 108 08/26/2022 1624   CO2 33 (H) 08/26/2022 1624   GLUCOSE 87 08/26/2022 1624   BUN 5 (L) 08/26/2022 1624   BUN 10 09/23/2020 1113   CREATININE 0.71 08/26/2022 1624   CALCIUM 8.6 08/26/2022 1624   CALCIUM 9.1 07/04/2012 1420   GFRNONAA >60 08/24/2022 2316   GFRAA 93 09/23/2020 1113    BNP    Component Value Date/Time   BNP 12.5 07/25/2021 2025    ProBNP    Component Value Date/Time   PROBNP 14.3 01/21/2013 2230    Imaging: ECHOCARDIOGRAM COMPLETE  Result Date: 01/24/2023    ECHOCARDIOGRAM REPORT   Patient Name:   [facility] Date of Exam: 01/24/2023 Medical Rec #:  [id number]              Height:       66.0 in Accession #:    [id number]             Weight:       189.0 lb Date of Birth:  [birth year]              BSA:          1.952 m Patient Age:    61 years               BP:           110/68 mmHg Patient Gender: F                      HR:           58 bpm. Exam Location:  [address] Procedure: 2D Echo, Color Doppler, Cardiac Doppler and 3D Echo Indications:    History of Mitral Valve Prolapse Z86.79  History:        Patient has no prior history of Echocardiogram examinations.                 Signs/Symptoms:Murmur.  Sonographer:    Thurman Coyer RDCS Referring Phys: [id number] [facility]  Sonographer Comments: Global longitudinal strain was attempted. IMPRESSIONS  1. Left ventricular  ejection fraction, by estimation, is 55 to 60%. Left ventricular ejection fraction by 3D volume is 58 %. The left ventricle has normal function. The left ventricle has no regional wall motion abnormalities. Left ventricular diastolic  parameters are indeterminate.  2. Right ventricular systolic function is normal. The right ventricular size is mildly enlarged. There is normal pulmonary artery systolic pressure.  3. The mitral valve is degenerative. Trivial mitral valve regurgitation. No evidence of mitral stenosis.  4. The aortic valve is tricuspid. Aortic valve regurgitation is not visualized. Aortic valve sclerosis is present, with no evidence of aortic valve stenosis.  5. The inferior vena cava is normal in size with greater than 50% respiratory variability, suggesting right atrial pressure of 3 mmHg. FINDINGS  Left Ventricle: Left ventricular ejection fraction, by estimation, is 55 to 60%. Left ventricular ejection fraction by 3D volume is 58 %. The left ventricle has normal function. The left ventricle has no regional wall motion abnormalities. The left ventricular internal cavity size was normal in size. There is  no left ventricular hypertrophy. Left ventricular diastolic parameters are indeterminate. Normal left ventricular filling pressure. Right Ventricle: The right ventricular size is mildly enlarged. No increase in right ventricular wall thickness. Right ventricular systolic function is normal. There is normal pulmonary artery systolic pressure. The tricuspid regurgitant velocity is 1.92  m/s, and with an assumed right atrial pressure of 3 mmHg, the estimated right ventricular systolic pressure is 17.7 mmHg. Left Atrium: Left atrial size was normal in size. Right Atrium: Right atrial size was normal in size. Pericardium: There is no evidence of pericardial effusion. Mitral Valve: The mitral valve is degenerative in appearance. Mild mitral annular calcification. Trivial mitral valve regurgitation. No  evidence of mitral valve stenosis. Tricuspid Valve: The tricuspid valve is normal in structure. Tricuspid valve regurgitation is mild . No evidence of tricuspid stenosis. Aortic Valve: The aortic valve is tricuspid. Aortic valve regurgitation is not visualized. Aortic valve sclerosis is present, with no evidence of aortic valve stenosis. Pulmonic Valve: The pulmonic valve was normal in structure. Pulmonic valve regurgitation is trivial. No evidence of pulmonic stenosis. Aorta: The aortic root is normal in size and structure. Venous: The inferior vena cava is normal in size with greater than 50% respiratory variability, suggesting right atrial pressure of 3 mmHg. IAS/Shunts: No atrial level shunt detected by color flow Doppler.  LEFT VENTRICLE PLAX 2D LVIDd:         4.20 cm         Diastology LVIDs:         2.80 cm         LV e' medial:    5.00 cm/s LV PW:         0.90 cm         LV E/e' medial:  11.8 LV IVS:        0.90 cm         LV e' lateral:   9.68 cm/s LVOT diam:     2.00 cm         LV E/e' lateral: 6.1 LV SV:         67 LV SV Index:   34 LVOT Area:     3.14 cm        3D Volume EF                                LV 3D EF:    Left                                             ventricul                                             ar                                             ejection  fraction                                             by 3D                                             volume is                                             58 %.                                 3D Volume EF:                                3D EF:        58 %                                LV EDV:       89 ml                                LV ESV:       37 ml                                LV SV:        52 ml RIGHT VENTRICLE RV Basal diam:  4.30 cm RV Mid diam:    3.10 cm RV S prime:     10.80 cm/s TAPSE (M-mode): 2.6 cm LEFT ATRIUM             Index        RIGHT ATRIUM           Index LA  diam:        3.20 cm 1.64 cm/m   RA Area:     19.30 cm LA Vol (A2C):   45.1 ml 23.10 ml/m  RA Volume:   51.40 ml  26.33 ml/m LA Vol (A4C):   47.0 ml 24.07 ml/m LA Biplane Vol: 48.7 ml 24.94 ml/m  AORTIC VALVE LVOT Vmax:   91.30 cm/s LVOT Vmean:  57.800 cm/s LVOT VTI:    0.213 m  AORTA Ao Root diam: 2.90 cm Ao Asc diam:  2.90 cm MITRAL VALVE               TRICUSPID VALVE MV Area (PHT): 3.31 cm    TR Peak grad:   14.7 mmHg MV Decel Time: 229 msec    TR Vmax:        192.00 cm/s MV E velocity: 59.10 cm/s MV A velocity: 63.80 cm/s  SHUNTS MV E/A ratio:  0.93        Systemic VTI:  0.21 m  Systemic Diam: 2.00 cm Armanda Magic MD Electronically signed by Armanda Magic MD Signature Date/Time: 01/24/2023/6:05:42 PM    Final      Assessment & Plan:   Sleep apnea - Most recent sleep study was negative for OSA, average AHI 2.3/hour with SpO2 low 90%. She did have moderate snoring. We discussed treatment options for snoring including weight loss, positional sleep and/or referral to orthodontic dentist for oral appliance.  At this time she would like to hold off on being fitted for oral device.  She will focus on side sleeping position and weight loss. She will let us know if she would like referral to orthodontist to be fitted for oral appliance to help with snoring.  Recommendations:  Focus on side sleeping position and/or look at trying wedge pillow to elevate head Continue weight loss efforts Avoid alcohol and sedative prior to bedtime You can try melatonin 3-5mg  at bedtime to help maintain sleep   Follow-up As needed if symptoms worsen    Glenford Bayley, NP 01/31/2023

## 2023-01-31 NOTE — Patient Instructions (Signed)
Sleep study on 10/12/22 was negative for obstructive sleep apnea. Average apnea/hyponea index was 2.3/hr. AHI during REM sleep was 5.2/hour. Minimal to no oxygen desaturations. SpO2 low 90% with average 94%. She did have moderate snoring.  Focus on side sleeping position and/or look at trying wedge pillow to elevate head Continue weight loss efforts Avoid alcohol and sedative prior to bedtime You can try melatonin 3-5mg  at bedtime to help maintain sleep  If you want to be referred to orthodontics let use know; Dr. Althea Grimmer and Dr. Irene Limbo are two dentist in [place] that can treat sleep apnea and snoring with oral appliance   Follow-up As needed if symptoms worsen

## 2023-01-31 NOTE — Progress Notes (Signed)
Reviewed and agree with assessment/plan.   Coralyn Helling, MD [facility] 01/31/2023, 12:04 PM Pager:  [phone]

## 2023-01-31 NOTE — Assessment & Plan Note (Signed)
-   Most recent sleep study was negative for OSA, average AHI 2.3/hour with SpO2 low 90%. She did have moderate snoring. We discussed treatment options for snoring including weight loss, positional sleep and/or referral to orthodontic dentist for oral appliance.  At this time she would like to hold off on being fitted for oral device.  She will focus on side sleeping position and weight loss. She will let us know if she would like referral to orthodontist to be fitted for oral appliance to help with snoring.  Recommendations:  Focus on side sleeping position and/or look at trying wedge pillow to elevate head Continue weight loss efforts Avoid alcohol and sedative prior to bedtime You can try melatonin 3-5mg  at bedtime to help maintain sleep   Follow-up As needed if symptoms worsen

## 2023-02-04 ENCOUNTER — Ambulatory Visit: Payer: 59 | Admitting: Family Medicine

## 2023-02-04 ENCOUNTER — Encounter: Payer: Self-pay | Admitting: Family Medicine

## 2023-02-04 VITALS — BP 104/74 | HR 75 | Temp 97.8°F | Ht 66.0 in | Wt 195.0 lb

## 2023-02-04 DIAGNOSIS — N3000 Acute cystitis without hematuria: Secondary | ICD-10-CM

## 2023-02-04 DIAGNOSIS — R350 Frequency of micturition: Secondary | ICD-10-CM | POA: Diagnosis not present

## 2023-02-04 DIAGNOSIS — M545 Low back pain, unspecified: Secondary | ICD-10-CM

## 2023-02-04 LAB — POC URINALSYSI DIPSTICK (AUTOMATED)
Bilirubin, UA: POSITIVE
Glucose, UA: NEGATIVE
Ketones, UA: NEGATIVE
Nitrite, UA: NEGATIVE
Protein, UA: POSITIVE — AB
Spec Grav, UA: >=1.03 — AB (ref 1.010–1.025)
Urobilinogen, UA: 0.2 E.U./dL
pH, UA: 5.5 (ref 5.0–8.0)

## 2023-02-04 MED ORDER — NITROFURANTOIN MONOHYD MACRO 100 MG PO CAPS
100.0000 mg | ORAL_CAPSULE | Freq: Two times a day (BID) | ORAL | 0 refills | Status: DC
Start: 2023-02-04 — End: 2023-08-19

## 2023-02-04 NOTE — Patient Instructions (Signed)
Start the antibiotic as prescribed.  Drink plenty of water.  Take Tylenol or ibuprofen if needed for back ache. Also use a heating pad.   We will be in touch with your urine culture result.   Let us know if you have any worsening symptoms.

## 2023-02-04 NOTE — Progress Notes (Signed)
Subjective:  [name] is a 62 y.o. female who complains of possible urinary tract infection.  She has had symptoms for 3 days.  Symptoms include  low back pain, urinary frequency and urgency . Patient denies  fever, abdominal pain, N/V .  Last UTI was 08/2022.     No other aggravating or relieving factors.  No other c/o.  Past Medical History:  Diagnosis Date   Anemia    Anxiety    Arrhythmia 2013   Colon polyp 08/28/2012   Tubular adenoma   Dense breast    Depression    Genital warts    GERD (gastroesophageal reflux disease)    Heart murmur    Hemorrhoids    Hx of cardiac catheterization 2009   normal coronary arteries   Hx of menorrhagia    Novasure   Hypokalemia    Hypomagnesemia    Hypophosphatemia    Lactose intolerance in adult    Meniscus degeneration, right    torn r knee/ 10/2019   Mitral valve prolapse    Normal cardiac stress test 03/09/2012   no ischemia   Positive PPD    Rapid heart beat    States "irreg heart beat"   Sleep apnea    Thyroid disease    Thyroid nodules    ROS as in subjective  Reviewed allergies, medications, past medical, surgical, and social history.    Objective: Vitals:   02/04/23 1328  BP: 104/74  Pulse: 75  Temp: 97.8 F (36.6 C)  SpO2: 98%    General appearance: alert, no distress, WD/WN, female Abdomen:  soft, non tender, non distended Back: no CVA tenderness      Laboratory:  Urine dipstick: 3+ for leukocyte esterase.       Assessment:   Plan: Discussed symptoms, diagnosis, possible complications, and usual course of illness.  Macrobid.  Advised increased water intake, can use OTC Tylenol for pain.       Urine culture sent.  Call or return if worse or not improving.

## 2023-02-05 LAB — URINE CULTURE

## 2023-02-27 DIAGNOSIS — R110 Nausea: Secondary | ICD-10-CM | POA: Diagnosis not present

## 2023-02-27 DIAGNOSIS — I361 Nonrheumatic tricuspid (valve) insufficiency: Secondary | ICD-10-CM | POA: Diagnosis not present

## 2023-02-27 DIAGNOSIS — Z7982 Long term (current) use of aspirin: Secondary | ICD-10-CM | POA: Diagnosis not present

## 2023-02-27 DIAGNOSIS — E785 Hyperlipidemia, unspecified: Secondary | ICD-10-CM | POA: Diagnosis not present

## 2023-02-27 DIAGNOSIS — E876 Hypokalemia: Secondary | ICD-10-CM | POA: Insufficient documentation

## 2023-02-27 DIAGNOSIS — Z8679 Personal history of other diseases of the circulatory system: Secondary | ICD-10-CM | POA: Diagnosis not present

## 2023-02-27 DIAGNOSIS — R0789 Other chest pain: Secondary | ICD-10-CM | POA: Diagnosis not present

## 2023-02-27 DIAGNOSIS — I4719 Other supraventricular tachycardia: Secondary | ICD-10-CM | POA: Diagnosis not present

## 2023-02-27 DIAGNOSIS — R7989 Other specified abnormal findings of blood chemistry: Secondary | ICD-10-CM | POA: Insufficient documentation

## 2023-02-27 DIAGNOSIS — G4733 Obstructive sleep apnea (adult) (pediatric): Secondary | ICD-10-CM | POA: Diagnosis not present

## 2023-02-27 DIAGNOSIS — R55 Syncope and collapse: Secondary | ICD-10-CM | POA: Diagnosis not present

## 2023-02-27 DIAGNOSIS — Z79899 Other long term (current) drug therapy: Secondary | ICD-10-CM | POA: Diagnosis not present

## 2023-02-27 DIAGNOSIS — R42 Dizziness and giddiness: Secondary | ICD-10-CM | POA: Diagnosis not present

## 2023-02-27 DIAGNOSIS — I10 Essential (primary) hypertension: Secondary | ICD-10-CM | POA: Diagnosis not present

## 2023-02-27 DIAGNOSIS — R002 Palpitations: Secondary | ICD-10-CM | POA: Diagnosis not present

## 2023-02-27 DIAGNOSIS — Z743 Need for continuous supervision: Secondary | ICD-10-CM | POA: Diagnosis not present

## 2023-02-27 DIAGNOSIS — R079 Chest pain, unspecified: Secondary | ICD-10-CM | POA: Diagnosis not present

## 2023-02-27 DIAGNOSIS — I341 Nonrheumatic mitral (valve) prolapse: Secondary | ICD-10-CM | POA: Diagnosis not present

## 2023-02-27 DIAGNOSIS — R7303 Prediabetes: Secondary | ICD-10-CM | POA: Diagnosis not present

## 2023-02-27 DIAGNOSIS — H538 Other visual disturbances: Secondary | ICD-10-CM | POA: Diagnosis not present

## 2023-02-28 ENCOUNTER — Telehealth: Payer: Self-pay | Admitting: Cardiovascular Disease

## 2023-02-28 DIAGNOSIS — E876 Hypokalemia: Secondary | ICD-10-CM | POA: Diagnosis not present

## 2023-02-28 DIAGNOSIS — R0789 Other chest pain: Secondary | ICD-10-CM | POA: Diagnosis not present

## 2023-02-28 DIAGNOSIS — R55 Syncope and collapse: Secondary | ICD-10-CM | POA: Diagnosis not present

## 2023-02-28 DIAGNOSIS — R7989 Other specified abnormal findings of blood chemistry: Secondary | ICD-10-CM | POA: Diagnosis not present

## 2023-02-28 DIAGNOSIS — R079 Chest pain, unspecified: Secondary | ICD-10-CM | POA: Diagnosis not present

## 2023-02-28 NOTE — Telephone Encounter (Signed)
Called patient, she states that she is currently in the hospital in [place], notes are in care everywhere, labs- elevated troponin. They are discussing options of a CCTA, and doing a heart cath, patient states she would like Dr.Berry's opinion- she is not sure what to do and she does not know them. She request a message be sent to him to review.   Thanks!

## 2023-02-28 NOTE — Telephone Encounter (Signed)
Pt states she is currently in the hospital in [place]. She would like a callback regarding the hospital wanting her to have a CT HT and CATH. Please advise

## 2023-02-28 NOTE — Telephone Encounter (Signed)
Called patient, advised of the message below.   Patient would like for Dr.Berry to call her when he can. I advised that he would if he was able.  Patient verbalized understanding.  Patient thankful for call back.,

## 2023-03-02 ENCOUNTER — Other Ambulatory Visit: Payer: Self-pay | Admitting: Emergency Medicine

## 2023-03-02 DIAGNOSIS — E876 Hypokalemia: Secondary | ICD-10-CM

## 2023-03-04 ENCOUNTER — Ambulatory Visit: Payer: 59 | Attending: Physician Assistant | Admitting: Physician Assistant

## 2023-03-04 ENCOUNTER — Encounter: Payer: Self-pay | Admitting: Physician Assistant

## 2023-03-04 VITALS — BP 106/64 | HR 70 | Ht 66.0 in | Wt 196.0 lb

## 2023-03-04 DIAGNOSIS — R002 Palpitations: Secondary | ICD-10-CM

## 2023-03-04 MED ORDER — METOPROLOL SUCCINATE ER 25 MG PO TB24: 25.0000 mg | ORAL_TABLET | Freq: Two times a day (BID) | ORAL | 1 refills | Status: DC

## 2023-03-04 NOTE — Patient Instructions (Addendum)
Medication Instructions:   STOP Metoprolol Tartrate (Lopressor) START Metoprolol Succinate (Toprol-XL) 25 mg 2 times a day    *If you need a refill on your cardiac medications before your next appointment, please call your pharmacy*  Lab Work: NONE ordered at this time of appointment   If you have labs (blood work) drawn today and your tests are completely normal, you will receive your results only by: MyChart Message (if you have MyChart) OR A paper copy in the mail If you have any lab test that is abnormal or we need to change your treatment, we will call you to review the results.  Testing/Procedures:  [employer] Cardiac Event Monitor Instructions  Your physician has requested you wear your cardiac event monitor for __30___ days. [employer] may call or text to confirm a shipping address. The monitor will be sent to a land address via UPS. [employer] will not ship a monitor to a PO BOX. It typically takes 3-5 days to receive your monitor after it has been enrolled. [employer] will assist with USPS tracking if your package is delayed. The telephone number for [employer] is [phone]. Once you have received your monitor, please review the enclosed instructions. Instruction tutorials can also be viewed under help and settings on the enclosed cell phone. Your monitor has already been registered assigning a specific monitor serial # to you.  Billing and Self Pay Discount Information  [employer] has been provided the insurance information we had on file for you.  If your insurance has been updated, please call [employer] at [phone] to provide them with your updated insurance information.   [employer] offers a discounted Self Pay option for patients who have insurance that does not cover their cardiac event monitor or patients without insurance.  The discounted cost of a Self Pay Cardiac Event Monitor would be $225.00 , if the patient contacts [employer] at [phone] within 7  days of applying the monitor to make payment arrangements.  If the patient does not contact [employer] within 7 days of applying the monitor, the cost of the cardiac event monitor will be $350.00.  Applying the monitor  Remove cell phone from case and turn it on. The cell phone works as IT consultant and needs to be within [employer] of you at all times. The cell phone will need to be charged on a daily basis. We recommend you plug the cell phone into the enclosed charger at your bedside table every night.  Monitor batteries: You will receive two monitor batteries labelled #1 and #2. These are your recorders. Plug battery #2 onto the second connection on the enclosed charger. Keep one battery on the charger at all times. This will keep the monitor battery deactivated. It will also keep it fully charged for when you need to switch your monitor batteries. A small light will be blinking on the battery emblem when it is charging. The light on the battery emblem will remain on when the battery is fully charged.  Open package of a Monitor strip. Insert battery #1 into black hood on strip and gently squeeze monitor battery onto connection as indicated in instruction booklet. Set aside while preparing skin.  Choose location for your strip, vertical or horizontal, as indicated in the instruction booklet. Shave to remove all hair from location. There cannot be any lotions, oils, powders, or colognes on skin where monitor is to be applied. Wipe skin clean with enclosed Saline wipe. Dry skin completely.  Peel paper labeled #1 off the back of  the Monitor strip exposing the adhesive. Place the monitor on the chest in the vertical or horizontal position shown in the instruction booklet. One arrow on the monitor strip must be pointing upward. Carefully remove paper labeled #2, attaching remainder of strip to your skin. Try not to create any folds or wrinkles in the strip as you apply it.  Firmly press and  release the circle in the center of the monitor battery. You will hear a small beep. This is turning the monitor battery on. The heart emblem on the monitor battery will light up every 5 seconds if the monitor battery in turned on and connected to the patient securely. Do not push and hold the circle down as this turns the monitor battery off. The cell phone will locate the monitor battery. A screen will appear on the cell phone checking the connection of your monitor strip. This may read poor connection initially but change to good connection within the next minute. Once your monitor accepts the connection you will hear a series of 3 beeps followed by a climbing crescendo of beeps. A screen will appear on the cell phone showing the two monitor strip placement options. Touch the picture that demonstrates where you applied the monitor strip.  Your monitor strip and battery are waterproof. You are able to shower, bathe, or swim with the monitor on. They just ask you do not submerge deeper than 3 feet underwater. We recommend removing the monitor if you are swimming in a lake, river, or ocean.  Your monitor battery will need to be switched to a fully charged monitor battery approximately once a week. The cell phone will alert you of an action which needs to be made.  On the cell phone, tap for details to reveal connection status, monitor battery status, and cell phone battery status. The green dots indicates your monitor is in good status. A red dot indicates there is something that needs your attention.  To record a symptom, click the circle on the monitor battery. In 30-60 seconds a list of symptoms will appear on the cell phone. Select your symptom and tap save. Your monitor will record a sustained or significant arrhythmia regardless of you clicking the button. Some patients do not feel the heart rhythm irregularities. [employer] will notify us of any serious or critical events.  Refer to  instruction booklet for instructions on switching batteries, changing strips, the Do not disturb or Pause features, or any additional questions.  Call [employer] at [phone], to confirm your monitor is transmitting and record your baseline. They will answer any questions you may have regarding the monitor instructions at that time.  Returning the monitor to [employer]  Place all equipment back into blue box. Peel off strip of paper to expose adhesive and close box securely. There is a prepaid UPS shipping label on this box. Drop in a UPS drop box, or at a UPS facility like [employer]. You may also contact [employer] to arrange UPS to pick up monitor package at your home.   Follow-Up: At [facility], you and your health needs are our priority.  As part of our continuing mission to provide you with exceptional heart care, we have created designated Provider Care Teams.  These Care Teams include your primary Cardiologist (physician) and Advanced Practice Providers (APPs -  Physician Assistants and Nurse Practitioners) who all work together to provide you with the care you need, when you need it.  Your next appointment:   2 month(s)  Provider:   Azalee Course, PA-C or APP        Other Instructions

## 2023-03-04 NOTE — Progress Notes (Unsigned)
Cardiology Office Note:  .   Date:  03/04/2023  ID:  [name], DOB 10/20/1960, MRN [id number] PCP: Georgina Quint, MD  [facility] Providers Cardiologist:  Nanetta Batty, MD { Click to update primary MD,subspecialty MD or APP then REFRESH:1}   History of Present Illness: [name]   [name] is a 62 y.o. female with past medical history of OSA, palpitation/PSVT, mitral valve prolapse, and hypertension.  She has never had a heart attack or stroke.  Cardiac catheterization performed in 2009 by Dr. Katrinka Blazing was normal.  She was last seen by Dr. Gery Pray in January 2024.  Blood pressure well-controlled.  She just underwent a sleep study.  She was having some palpitation, therefore a 2-week ZIO monitor was placed.  Heart monitor showed minimal heart rate of 46, maximal heart rate 122, average heart rate 70 bpm.  No significant arrhythmia.  PVC burden less than 1%.  She was most recently seen by Gavin Pound on 12/27/2022 with multiple questions and concerns.  Echocardiogram was performed following the visit on 01/24/2023 showed EF 55 to 60%, no regional wall motion abnormality, mild RV enlargement, normal PA systolic pressure, degenerative mitral valve with trivial MR.  She was also referred for weight management.  Since the last visit, she was seen at [facility] ED on 02/27/2023 with atypical chest pain.  Her chest pain was felt to be due to tachyarrhythmia.  She had positive enzymes, serial troponin 66--219--247--50..  Cardiology service was consulted.  Limited echocardiogram showed EF 55 to 60%, no regional wall motion abnormality, mild TR, no pericardial effusion.  Coronary CT performed on 02/28/2023 showed normal coronary arteries, no coronary artery disease.  Coronary calcium score of 0.  Lipid panel obtained in the hospital showed a total cholesterol 137, triglyceride 44, HDL 49, LDL 79.  TSH was normal.  Outpatient heart monitor was recommended.  Patient presents today for follow-up.   She is very tearful today.  She says she has never experienced such intense palpitation in the past.  She was driving at the time when the palpitations started.  It lasted a total of 2 minutes.  She did not check her pulse as she was driving.  Since she left [facility] hospital, she has not had any further recurrence.  This is different from her previous palpitation which usually occur after food.  I will change her metoprolol to tartrate to metoprolol succinate 25 mg twice a day to give her more even coverage throughout the day.  I discussed the case with Dr. Gery Pray who recommended a 30-day monitor, if she continues to have symptoms despite 30-day monitor, we may have to consider loop recorder.  We will see the patient back in 50-month.  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  ROS: ***  Studies Reviewed: .        *** Risk Assessment/Calculations:   {Does this patient have ATRIAL FIBRILLATION?:[phone]}         Physical Exam:   VS:  BP 106/64   Pulse 70   Ht 5\' 6"  (1.676 m)   Wt 196 lb (88.9 kg)   LMP 09/17/2016 (Approximate)   SpO2 99%   BMI 31.64 kg/m    Wt Readings from Last 3 Encounters:  03/04/23 196 lb (88.9 kg)  02/04/23 195 lb (88.5 kg)  01/31/23 195 lb 6.4 oz (88.6 kg)    GEN: Well nourished, well developed in no acute distress NECK: No JVD; No carotid  bruits CARDIAC: ***RRR, no murmurs, rubs, gallops RESPIRATORY:  Clear to auscultation without rales, wheezing or rhonchi  ABDOMEN: Soft, non-tender, non-distended EXTREMITIES:  No edema; No deformity   ASSESSMENT AND PLAN: .   ***    {Are you ordering a CV Procedure (e.g. stress test, cath, DCCV, TEE, etc)?   Press F2        :[id number]}  Dispo: ***  Signed, Azalee Course, PA

## 2023-03-07 DIAGNOSIS — F4310 Post-traumatic stress disorder, unspecified: Secondary | ICD-10-CM | POA: Diagnosis not present

## 2023-03-07 DIAGNOSIS — F331 Major depressive disorder, recurrent, moderate: Secondary | ICD-10-CM | POA: Diagnosis not present

## 2023-03-07 DIAGNOSIS — F411 Generalized anxiety disorder: Secondary | ICD-10-CM | POA: Diagnosis not present

## 2023-03-14 ENCOUNTER — Telehealth: Payer: Self-pay | Admitting: Cardiovascular Disease

## 2023-03-14 DIAGNOSIS — R002 Palpitations: Secondary | ICD-10-CM

## 2023-03-14 NOTE — Telephone Encounter (Signed)
Pt states after she was told who her monitor was being ordered through, she called the company & they state they don't have an order. She would like to know where it is in the process as this past Friday made one week since the order put in. Please advise.

## 2023-03-14 NOTE — Telephone Encounter (Signed)
See heart monitor order but states completed.  Unsure if that means the order itself was "completed" or the monitor.  Which she has not received, so unsure if that was mistakenly completed.  Please advise so I can let patient know

## 2023-03-15 ENCOUNTER — Encounter: Payer: Self-pay | Admitting: *Deleted

## 2023-03-15 NOTE — Telephone Encounter (Signed)
Called pt back to let her know that new order for cardiac event monitor has been placed. It does like like somehow monitor order was completed. Pt understands that this will be a live monitor and cell phone should be kept within 10 feet of her.

## 2023-03-15 NOTE — Progress Notes (Signed)
Patient enrolled for [employer]/ [employer] to ship a 30 day cardiac event monitor to her address on file. 

## 2023-03-15 NOTE — Progress Notes (Unsigned)
Patient had cardiac event monitor order placed 03/04/23, however, [name] marked the status as normal instead of future.  This would link the order to the office visit that day and mark it complete when visit completed. A new order has been entered and will be processed today.

## 2023-03-23 DIAGNOSIS — F331 Major depressive disorder, recurrent, moderate: Secondary | ICD-10-CM | POA: Diagnosis not present

## 2023-03-23 DIAGNOSIS — F4310 Post-traumatic stress disorder, unspecified: Secondary | ICD-10-CM | POA: Diagnosis not present

## 2023-03-23 DIAGNOSIS — F411 Generalized anxiety disorder: Secondary | ICD-10-CM | POA: Diagnosis not present

## 2023-03-24 DIAGNOSIS — R002 Palpitations: Secondary | ICD-10-CM

## 2023-03-25 DIAGNOSIS — R002 Palpitations: Secondary | ICD-10-CM | POA: Diagnosis not present

## 2023-04-11 DIAGNOSIS — F4310 Post-traumatic stress disorder, unspecified: Secondary | ICD-10-CM | POA: Diagnosis not present

## 2023-04-11 DIAGNOSIS — F411 Generalized anxiety disorder: Secondary | ICD-10-CM | POA: Diagnosis not present

## 2023-04-11 DIAGNOSIS — F331 Major depressive disorder, recurrent, moderate: Secondary | ICD-10-CM | POA: Diagnosis not present

## 2023-04-14 DIAGNOSIS — M25561 Pain in right knee: Secondary | ICD-10-CM | POA: Diagnosis not present

## 2023-04-23 ENCOUNTER — Ambulatory Visit
Admission: EM | Admit: 2023-04-23 | Discharge: 2023-04-23 | Disposition: A | Discharge status: Home / Self Care | Payer: 59 | Attending: Internal Medicine | Admitting: Internal Medicine

## 2023-04-23 DIAGNOSIS — U071 COVID-19: Secondary | ICD-10-CM

## 2023-04-23 MED ORDER — MOLNUPIRAVIR EUA 200MG CAPSULE: 4.0000 | ORAL_CAPSULE | Freq: Two times a day (BID) | ORAL | 0 refills | Status: AC

## 2023-04-23 MED ORDER — BENZONATATE 100 MG PO CAPS: 100.0000 mg | ORAL_CAPSULE | Freq: Three times a day (TID) | ORAL | 0 refills | Status: DC | PRN

## 2023-04-23 NOTE — ED Triage Notes (Signed)
Pt states she had a positive covid test this morning.  States she has cough,congestion and headache for the past 2 days.  Has been taking mucinex at home with relief.

## 2023-04-23 NOTE — Discharge Instructions (Signed)
I have prescribed you COVID antiviral medication as well as a cough medication.  Ensure you are drinking plenty of water and getting plenty of rest.  Follow-up if any symptoms persist or worsen.

## 2023-04-23 NOTE — ED Provider Notes (Signed)
[facility]    CSN: [id number] Arrival date & time: 04/23/23  1003      History   Chief Complaint Chief Complaint  Patient presents with   covid    HPI [name] is a 62 y.o. female.   Patient presents for further evaluation after testing positive for COVID-19 with an at-home test this morning.  Patient states that she has had cough, nasal congestion, headache, sore throat for about 2 days.  Has taken Mucinex at home with some relief.  Denies any fever.  Reports that she was exposed to COVID at a recent family gathering.  Denies chest pain or shortness of breath.  Denies history of asthma or COPD and patient does not smoke cigarettes.     Past Medical History:  Diagnosis Date   Anemia    Anxiety    Arrhythmia 2013   Colon polyp 08/28/2012   Tubular adenoma   Dense breast    Depression    Genital warts    GERD (gastroesophageal reflux disease)    Heart murmur    Hemorrhoids    Hx of cardiac catheterization 2009   normal coronary arteries   Hx of menorrhagia    Novasure   Hypokalemia    Hypomagnesemia    Hypophosphatemia    Lactose intolerance in adult    Meniscus degeneration, right    torn r knee/ 10/2019   Mitral valve prolapse    Normal cardiac stress test 03/09/2012   no ischemia   Positive PPD    Rapid heart beat    States "irreg heart beat"   Sleep apnea    Thyroid disease    Thyroid nodules    Patient Active Problem List   Diagnosis Date Noted   Postprandial abdominal bloating 08/26/2022   Left lower quadrant abdominal tenderness without rebound tenderness 08/26/2022   Diarrhea 08/26/2022   Vomiting without nausea 08/26/2022   Family history of bladder cancer 07/05/2022   Intermittent gross hematuria 07/05/2022   Recent urinary tract infection 07/05/2022   Weight loss 12/07/2021   Fatigue 12/07/2021   Post viral syndrome 12/07/2021   Chronic sinusitis 08/25/2021   Prediabetes 08/25/2021   Paroxysmal SVT  (supraventricular tachycardia) 03/31/2021   Degenerative disc disease, cervical 10/13/2020   Stress incontinence of urine 10/31/2018   History of diverticulosis 05/04/2017   Malaise 07/05/2013   Palpitations 07/05/2013   History of PSVT (paroxysmal supraventricular tachycardia) 06/24/2012   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      Home Medications    Prior to Admission medications   Medication Sig Start Date End Date Taking? Authorizing Provider  benzonatate (TESSALON) 100 MG capsule Take 1 capsule (100 mg total) by mouth every 8 (eight) hours as needed for cough. 04/23/23  Yes , [place] E, FNP  molnupiravir EUA (LAGEVRIO) 200 mg CAPS capsule Take 4 capsules (800 mg total) by mouth 2 (two) times daily for 5 days. 04/23/23 04/28/23 Yes , Acie Fredrickson, FNP  Ascorbic Acid (VITAMIN C) 1000 MG tablet Take 2,000 mg by mouth at bedtime.    [provider]  aspirin EC 81 MG EC  tablet Take 1 tablet (81 mg total) by mouth daily. 10/21/19   Orpah Cobb, MD  Calcium Carb-Cholecalciferol (OYSTER SHELL CALCIUM 250+D PO) Take 2 tablets by mouth at bedtime.    [provider]  Cholecalciferol (VITAMIN D) 50 MCG (2000 UT) tablet Take 4,000 Units by mouth at bedtime.     [provider]  dicyclomine (BENTYL) 10 MG capsule Take 1 capsule (10 mg total) by mouth 3 (three) times daily as needed for spasms. 08/26/22   Henson, Vickie L, NP-C  KLOR-CON M20 20 MEQ tablet TAKE 2 TABLETS (40 MEQ TOTAL) BY MOUTH DAILY. TAKE 2 TABLETS BY MOUTH EVERY DAY 03/02/23   Georgina Quint, MD  losartan (COZAAR) 50 MG tablet Take 25 mg by mouth 2 (two) times daily.     [provider]  metoprolol succinate (TOPROL XL) 25 MG 24 hr tablet Take 1 tablet (25 mg total) by mouth in the morning and at bedtime. 03/04/23   Azalee Course, PA  Multiple Vitamins-Minerals (ZINC PO) Take 1 tablet by mouth at bedtime.    [provider]  nitrofurantoin, macrocrystal-monohydrate, (MACROBID) 100 MG capsule Take 1 capsule (100 mg total) by mouth 2 (two) times daily. 02/04/23   Henson, Vickie L, NP-C  pantoprazole (PROTONIX) 40 MG tablet Take 1 tablet (40 mg total) by mouth daily. 11/08/22   Georgina Quint, MD  PARoxetine (PAXIL-CR) 25 MG 24 hr tablet Take 25 mg by mouth daily.    [provider]  Polyethyl Glycol-Propyl Glycol (SYSTANE OP) Place 2 drops into both eyes daily.    [provider]  TURMERIC PO Take 1 capsule by mouth daily as needed (pain/inflammation).    [provider]    Family History Family History  Problem Relation Age of Onset   Heart disease Father        died Mi 103   Diabetes Maternal Grandmother    Hypertension Maternal Grandfather    Diabetes Maternal Grandfather    Heart disease Maternal Grandfather    Colon cancer Maternal Grandfather    Rectal cancer Maternal Grandfather    Prostate cancer Neg Hx    Breast cancer Neg Hx    Colon polyps Neg Hx    Esophageal cancer Neg Hx     Social History Social History   Tobacco Use   Smoking status: Never   Smokeless tobacco: Never  Vaping Use   Vaping status: Never Used  Substance Use Topics   Alcohol use: No   Drug use: No     Allergies   Doxycycline   Review of Systems Review of Systems Per HPI  Physical Exam Triage Vital Signs ED Triage Vitals  Encounter Vitals Group     BP 04/23/23 1110 111/69     Systolic BP Percentile --      Diastolic BP Percentile --      Pulse Rate 04/23/23 1110 89     Resp 04/23/23 1110 16     Temp 04/23/23 1110 99.1 F (37.3 C)     Temp Source 04/23/23 1110 Oral     SpO2 04/23/23 1110 96 %     Weight  --      Height --      Head Circumference --      Peak Flow --      Pain Score 04/23/23 1112 0     Pain Loc --      Pain Education --      Exclude from Growth Chart --  No data found.  Updated Vital Signs BP 111/69 (BP Location: Left Arm)   Pulse 89   Temp 99.1 F (37.3 C) (Oral)   Resp 16   LMP 09/17/2016 (Approximate)   SpO2 96%   Visual Acuity Right Eye Distance:   Left Eye Distance:   Bilateral Distance:    Right Eye Near:   Left Eye Near:    Bilateral Near:     Physical Exam Constitutional:      General: She is not in acute distress.    Appearance: Normal appearance. She is not toxic-appearing or diaphoretic.  HENT:     Head: Normocephalic and atraumatic.     Right Ear: Ear canal normal. A middle ear effusion is present. Tympanic membrane is not perforated, erythematous or bulging.     Left Ear: Ear canal normal. A middle ear effusion is present. Tympanic membrane is not perforated, erythematous or bulging.     Nose: Congestion present.     Mouth/Throat:     Mouth: Mucous membranes are moist.     Pharynx: Posterior oropharyngeal erythema present.  Eyes:     Extraocular Movements: Extraocular movements intact.     Conjunctiva/sclera: Conjunctivae normal.     Pupils: Pupils are equal, round, and reactive to light.  Cardiovascular:     Rate and Rhythm: Normal rate and regular rhythm.     Pulses: Normal pulses.     Heart sounds: Normal heart sounds.  Pulmonary:     Effort: Pulmonary effort is normal. No respiratory distress.     Breath sounds: Normal breath sounds. No stridor. No wheezing, rhonchi or rales.  Abdominal:     General: Abdomen is flat. Bowel sounds are normal.     Palpations: Abdomen is soft.  Musculoskeletal:        General: Normal range of motion.     Cervical back: Normal range of motion.  Skin:    General: Skin is warm and dry.  Neurological:     General: No focal deficit present.     Mental Status: She is alert and oriented to person,  place, and time. Mental status is at baseline.  Psychiatric:        Mood and Affect: Mood normal.        Behavior: Behavior normal.      UC Treatments / Results  Labs (all labs ordered are listed, but only abnormal results are displayed) Labs Reviewed - No data to display  EKG   Radiology No results found.  Procedures Procedures (including critical care time)  Medications Ordered in UC Medications - No data to display  Initial Impression / Assessment and Plan / UC Course  I have reviewed the triage vital signs and the nursing notes.  Pertinent labs & imaging results that were available during my care of the patient were reviewed by me and considered in my medical decision making (see chart for details).     Patient tested positive for COVID-19 with an at home test.  Discussed COVID antiviral therapy with patient and she wishes to proceed with COVID antivirals.  Will prescribe molnupiravir for patient.  Benzonatate prescribed to take as needed for cough as well.  Advised Flonase for fluid behind TMs and patient reports she already has this at home.  Advised strict follow-up precautions if any symptoms persist or worsen.  Patient verbalized understanding and was agreeable with plan. Final Clinical Impressions(s) / UC Diagnoses   Final diagnoses:  COVID-19     Discharge Instructions  I have prescribed you COVID antiviral medication as well as a cough medication.  Ensure you are drinking plenty of water and getting plenty of rest.  Follow-up if any symptoms persist or worsen.    ED Prescriptions     Medication Sig Dispense Auth. Provider   molnupiravir EUA (LAGEVRIO) 200 mg CAPS capsule Take 4 capsules (800 mg total) by mouth 2 (two) times daily for 5 days. 40 capsule [place], [place] E, [place]   benzonatate (TESSALON) 100 MG capsule Take 1 capsule (100 mg total) by mouth every 8 (eight) hours as needed for cough. 21 capsule [place], Acie Fredrickson, [place]      PDMP not reviewed  this encounter.   Gustavus Bryant, [place] 04/23/23 1134

## 2023-04-25 ENCOUNTER — Ambulatory Visit: Payer: 59 | Admitting: Physician Assistant

## 2023-04-25 ENCOUNTER — Ambulatory Visit: Payer: 59 | Attending: Physician Assistant

## 2023-04-25 DIAGNOSIS — R002 Palpitations: Secondary | ICD-10-CM

## 2023-04-25 NOTE — Progress Notes (Signed)
This encounter was created in error - please disregard.

## 2023-05-02 DIAGNOSIS — F4310 Post-traumatic stress disorder, unspecified: Secondary | ICD-10-CM | POA: Diagnosis not present

## 2023-05-02 DIAGNOSIS — F411 Generalized anxiety disorder: Secondary | ICD-10-CM | POA: Diagnosis not present

## 2023-05-02 DIAGNOSIS — F331 Major depressive disorder, recurrent, moderate: Secondary | ICD-10-CM | POA: Diagnosis not present

## 2023-05-03 ENCOUNTER — Ambulatory Visit: Payer: 59 | Admitting: Family Medicine

## 2023-05-05 ENCOUNTER — Encounter: Payer: Self-pay | Admitting: Internal Medicine

## 2023-05-05 ENCOUNTER — Ambulatory Visit: Payer: 59 | Admitting: Internal Medicine

## 2023-05-05 VITALS — BP 118/78 | HR 59 | Temp 99.2°F | Ht 66.0 in | Wt 195.0 lb

## 2023-05-05 DIAGNOSIS — S83281D Other tear of lateral meniscus, current injury, right knee, subsequent encounter: Secondary | ICD-10-CM

## 2023-05-05 DIAGNOSIS — M79662 Pain in left lower leg: Secondary | ICD-10-CM | POA: Diagnosis not present

## 2023-05-05 DIAGNOSIS — M7989 Other specified soft tissue disorders: Secondary | ICD-10-CM

## 2023-05-05 DIAGNOSIS — R7303 Prediabetes: Secondary | ICD-10-CM | POA: Diagnosis not present

## 2023-05-05 DIAGNOSIS — I10 Essential (primary) hypertension: Secondary | ICD-10-CM

## 2023-05-05 DIAGNOSIS — S83206A Unspecified tear of unspecified meniscus, current injury, right knee, initial encounter: Secondary | ICD-10-CM | POA: Insufficient documentation

## 2023-05-05 NOTE — Patient Instructions (Signed)
Please continue all other medications as before, and refills have been done if requested.  Please have the pharmacy call with any other refills you may need.  Please keep your appointments with your specialists as you may have planned  You will be contacted regarding the referral for: left leg venous doppler to make sure there is no blood clot involved  You will be contacted regarding the referral for: Sports Medicine

## 2023-05-05 NOTE — Progress Notes (Signed)
Patient ID: [name], female   DOB: Jun 28, 1961, 62 y.o.   MRN: [id number]        Chief Complaint: follow up recent covid infection, left leg pain       HPI:  [name] is a 62 y.o. female here with c/o known right knee lateral meniscal tearing now improved after cortisone, but now with worsening left pain pain and swelling below the knee without specific knee pain and swelling;admits to recent gait difficulty with trying to keep wt off the right knee but still walking, standing and working.  Now pain to the left achilles and calf with swelling and feeling of firmness she can feel at the post calf.  Pt denies chest pain, increased sob or doe, wheezing, orthopnea, PND, increased LE swelling, palpitations, dizziness or syncope.   Pt denies polydipsia, polyuria, or new focal neuro s/s.          Wt Readings from Last 3 Encounters:  05/05/23 195 lb (88.5 kg)  03/04/23 196 lb (88.9 kg)  02/04/23 195 lb (88.5 kg)   BP Readings from Last 3 Encounters:  05/05/23 118/78  04/23/23 111/69  03/04/23 106/64         Past Medical History:  Diagnosis Date   Anemia    Anxiety    Arrhythmia 2013   Colon polyp 08/28/2012   Tubular adenoma   Dense breast    Depression    Genital warts    GERD (gastroesophageal reflux disease)    Heart murmur    Hemorrhoids    Hx of cardiac catheterization 2009   normal coronary arteries   Hx of menorrhagia    Novasure   Hypokalemia    Hypomagnesemia    Hypophosphatemia    Lactose intolerance in adult    Meniscus degeneration, right    torn r knee/ 10/2019   Mitral valve prolapse    Normal cardiac stress test 03/09/2012   no ischemia   Positive PPD    Rapid heart beat    States "irreg heart beat"   Sleep apnea    Thyroid disease    Thyroid nodules   Past Surgical History:  Procedure Laterality Date   CARDIAC CATHETERIZATION     x2 with normal results per pt   NOVASURE ABLATION     UPPER GASTROINTESTINAL ENDOSCOPY  May 2013     reports that she has never smoked. She has never used smokeless tobacco. She reports that she does not drink alcohol and does not use drugs. family history includes Colon cancer in her maternal grandfather; Diabetes in her maternal grandfather and maternal grandmother; Heart disease in her father and maternal grandfather; Hypertension in her maternal grandfather; Rectal cancer in her maternal grandfather. Allergies  Allergen Reactions   Doxycycline Rash   Current Outpatient Medications on File Prior to Visit  Medication Sig Dispense Refill   Ascorbic Acid (VITAMIN C) 1000 MG tablet Take 2,000 mg by mouth at bedtime.     aspirin EC 81 MG EC tablet Take 1 tablet (81 mg total) by mouth daily.     benzonatate (TESSALON) 100 MG capsule Take 1 capsule (100 mg total) by mouth every 8 (eight) hours as needed for cough. 21 capsule 0   Calcium Carb-Cholecalciferol (OYSTER SHELL CALCIUM 250+D PO) Take 2 tablets by mouth at bedtime.     Cholecalciferol (VITAMIN D) 50 MCG (2000 UT) tablet Take 4,000 Units by mouth at bedtime.      dicyclomine (BENTYL) 10 MG capsule Take  1 capsule (10 mg total) by mouth 3 (three) times daily as needed for spasms. 30 capsule 0   KLOR-CON M20 20 MEQ tablet TAKE 2 TABLETS (40 MEQ TOTAL) BY MOUTH DAILY. TAKE 2 TABLETS BY MOUTH EVERY DAY 60 tablet 23   losartan (COZAAR) 50 MG tablet Take 25 mg by mouth 2 (two) times daily.     metoprolol succinate (TOPROL XL) 25 MG 24 hr tablet Take 1 tablet (25 mg total) by mouth in the morning and at bedtime. 180 tablet 1   Multiple Vitamins-Minerals (ZINC PO) Take 1 tablet by mouth at bedtime.     nitrofurantoin, macrocrystal-monohydrate, (MACROBID) 100 MG capsule Take 1 capsule (100 mg total) by mouth 2 (two) times daily. 10 capsule 0   pantoprazole (PROTONIX) 40 MG tablet Take 1 tablet (40 mg total) by mouth daily. 30 tablet 3   PARoxetine (PAXIL-CR) 25 MG 24 hr tablet Take 25 mg by mouth daily.     Polyethyl Glycol-Propyl Glycol  (SYSTANE OP) Place 2 drops into both eyes daily.     TURMERIC PO Take 1 capsule by mouth daily as needed (pain/inflammation).     No current facility-administered medications on file prior to visit.        ROS:  All others reviewed and negative.  Objective        PE:  BP 118/78 (BP Location: Left Arm, Patient Position: Sitting, Cuff Size: Normal)   Pulse (!) 59   Temp 99.2 F (37.3 C) (Oral)   Ht 5\' 6"  (1.676 m)   Wt 195 lb (88.5 kg)   LMP 09/17/2016 (Approximate)   SpO2 99%   BMI 31.47 kg/m                 Constitutional: Pt appears in NAD               HENT: Head: NCAT.                Right Ear: External ear normal.                 Left Ear: External ear normal.                Eyes: . Pupils are equal, round, and reactive to light. Conjunctivae and EOM are normal               Nose: without d/c or deformity               Neck: Neck supple. Gross normal ROM               Cardiovascular: Normal rate and regular rhythm.                 Pulmonary/Chest: Effort normal and breath sounds without rales or wheezing.                Abd:  Soft, NT, ND, + BS, no organomegaly               Neurological: Pt is alert. At baseline orientation, motor grossly intact               Skin: Skin is warm. No rashes, no other new lesions, LE edema - none, left calf with trace swelling and tender to leg below the knee               Psychiatric: Pt behavior is normal without agitation   Micro: none  Cardiac tracings I have personally interpreted today:  none  Pertinent Radiological findings (summarize): none   Lab Results  Component Value Date   WBC 4.9 08/26/2022   HGB 12.6 08/26/2022   HCT 40.3 08/26/2022   PLT 193.0 08/26/2022   GLUCOSE 87 08/26/2022   CHOL 141 12/27/2022   TRIG 91 12/27/2022   HDL 48 12/27/2022   LDLCALC 76 12/27/2022   ALT 18 08/26/2022   AST 20 08/26/2022   NA 142 08/26/2022   K 3.7 08/26/2022   CL 108 08/26/2022   CREATININE 0.71 08/26/2022   BUN 5 (L)  08/26/2022   CO2 33 (H) 08/26/2022   TSH 0.963 07/14/2022   INR 1.0 05/21/2021   HGBA1C 6.3 12/07/2021   Assessment/Plan:  [name] is a 62 y.o. Black or African American [2] female with  has a past medical history of Anemia, Anxiety, Arrhythmia (2013), Colon polyp (08/28/2012), Dense breast, Depression, Genital warts, GERD (gastroesophageal reflux disease), Heart murmur, Hemorrhoids, cardiac catheterization (2009), menorrhagia, Hypokalemia, Hypomagnesemia, Hypophosphatemia, Lactose intolerance in adult, Meniscus degeneration, right, Mitral valve prolapse, Normal cardiac stress test (03/09/2012), Positive PPD, Rapid heart beat, Sleep apnea, and Thyroid disease.  Pain and swelling of left lower leg I suspect possible achilles tendoniti and calf strain due to gait difficulty related to trying to keep wt off the right knee with walking and standing recently, but can't r/o dvt - for LLE venous doppler, also refer sports medicine  Prediabetes Lab Results  Component Value Date   HGBA1C 6.3 12/07/2021   Stable, pt to continue current medical treatment  - diet, wt control   Primary hypertension BP Readings from Last 3 Encounters:  05/05/23 118/78  04/23/23 111/69  03/04/23 106/64   Stable, pt to continue medical treatment losartan 50 every day, toprol xl 25 qd    Right knee meniscal tear Improved with cortisone, may need surgury if worsens again  Followup: Return if symptoms worsen or fail to improve.  Oliver Barre, MD 05/07/2023 3:50 PM [facility]

## 2023-05-07 ENCOUNTER — Encounter: Payer: Self-pay | Admitting: Internal Medicine

## 2023-05-07 NOTE — Assessment & Plan Note (Signed)
Lab Results  Component Value Date   HGBA1C 6.3 12/07/2021   Stable, pt to continue current medical treatment  - diet, wt control

## 2023-05-07 NOTE — Assessment & Plan Note (Signed)
BP Readings from Last 3 Encounters:  05/05/23 118/78  04/23/23 111/69  03/04/23 106/64   Stable, pt to continue medical treatment losartan 50 every day, toprol xl 25 qd

## 2023-05-07 NOTE — Assessment & Plan Note (Signed)
Improved with cortisone, may need surgury if worsens again

## 2023-05-07 NOTE — Assessment & Plan Note (Signed)
I suspect possible achilles tendoniti and calf strain due to gait difficulty related to trying to keep wt off the right knee with walking and standing recently, but can't r/o dvt - for LLE venous doppler, also refer sports medicine

## 2023-05-09 ENCOUNTER — Ambulatory Visit (HOSPITAL_COMMUNITY)
Admission: RE | Admit: 2023-05-09 | Discharge: 2023-05-09 | Disposition: A | Discharge status: Home / Self Care | Payer: 59 | Source: Ambulatory Visit | Attending: Cardiology | Admitting: Cardiology

## 2023-05-09 DIAGNOSIS — M7989 Other specified soft tissue disorders: Secondary | ICD-10-CM | POA: Diagnosis not present

## 2023-05-09 DIAGNOSIS — M79662 Pain in left lower leg: Secondary | ICD-10-CM | POA: Diagnosis not present

## 2023-05-10 DIAGNOSIS — F4310 Post-traumatic stress disorder, unspecified: Secondary | ICD-10-CM | POA: Diagnosis not present

## 2023-05-10 DIAGNOSIS — F411 Generalized anxiety disorder: Secondary | ICD-10-CM | POA: Diagnosis not present

## 2023-05-10 DIAGNOSIS — F331 Major depressive disorder, recurrent, moderate: Secondary | ICD-10-CM | POA: Diagnosis not present

## 2023-05-23 DIAGNOSIS — F411 Generalized anxiety disorder: Secondary | ICD-10-CM | POA: Diagnosis not present

## 2023-05-23 DIAGNOSIS — F4310 Post-traumatic stress disorder, unspecified: Secondary | ICD-10-CM | POA: Diagnosis not present

## 2023-05-23 DIAGNOSIS — F331 Major depressive disorder, recurrent, moderate: Secondary | ICD-10-CM | POA: Diagnosis not present

## 2023-06-06 ENCOUNTER — Ambulatory Visit: Payer: 59 | Attending: Physician Assistant | Admitting: Physician Assistant

## 2023-06-06 ENCOUNTER — Encounter: Payer: Self-pay | Admitting: Physician Assistant

## 2023-06-06 VITALS — BP 106/78 | HR 72 | Ht 66.0 in | Wt 192.8 lb

## 2023-06-06 DIAGNOSIS — I10 Essential (primary) hypertension: Secondary | ICD-10-CM | POA: Diagnosis not present

## 2023-06-06 DIAGNOSIS — R002 Palpitations: Secondary | ICD-10-CM | POA: Diagnosis not present

## 2023-06-06 NOTE — Patient Instructions (Signed)
Medication Instructions:  The current medical regimen is effective;  continue present plan and medications as directed. Please refer to the Current Medication list given to you today.  *If you need a refill on your cardiac medications before your next appointment, please call your pharmacy*  Lab Work: NONE If you have labs (blood work) drawn today and your tests are completely normal, you will receive your results only by: MyChart Message (if you have MyChart) OR A paper copy in the mail If you have any lab test that is abnormal or we need to change your treatment, we will call you to review the results.  Testing/Procedures: NONE  Follow-Up: At [facility], you and your health needs are our priority.  As part of our continuing mission to provide you with exceptional heart care, we have created designated Provider Care Teams.  These Care Teams include your primary Cardiologist (physician) and Advanced Practice Providers (APPs -  Physician Assistants and Nurse Practitioners) who all work together to provide you with the care you need, when you need it.  Your next appointment:   6 month(s)  Provider:   Nanetta Batty, MD     Other Instructions

## 2023-06-06 NOTE — Progress Notes (Unsigned)
Cardiology Office Note:  .   Date:  06/07/2023  ID:  [name], DOB [birth year], MRN [id number] PCP: Georgina Quint, MD  [facility] Providers Cardiologist:  Nanetta Batty, MD     History of Present Illness: [name]   [name] is a 62 y.o. female with past medical history of OSA, palpitation/PSVT, mitral valve prolapse, and hypertension.  She has never had a heart attack or stroke.  Cardiac catheterization performed in 2009 by Dr. Katrinka Blazing was normal.  She was last seen by Dr. Allyson Sabal in January 2024.  Blood pressure well-controlled.  She just underwent a sleep study.  She was having some palpitation, therefore a 2-week ZIO monitor was placed.  Heart monitor showed minimal heart rate of 46, maximal heart rate 122, average heart rate 70 bpm.  No significant arrhythmia.  PVC burden less than 1%.  She was most recently seen by Gavin Pound on 12/27/2022 with multiple questions and concerns.  Echocardiogram was performed following the visit on 01/24/2023 showed EF 55 to 60%, no regional wall motion abnormality, mild RV enlargement, normal PA systolic pressure, degenerative mitral valve with trivial MR.  She was also referred for weight management.    Since the last visit, she was seen at [facility] ED on 02/27/2023 with atypical chest pain.  Her chest pain was felt to be due to tachyarrhythmia.  She had positive enzymes, serial troponin 66-->219-->247-->50.[name]  Cardiology service was consulted.  Limited echocardiogram showed EF 55 to 60%, no regional wall motion abnormality, mild TR, no pericardial effusion.  Coronary CT performed on 02/28/2023 showed normal coronary arteries, no coronary artery disease.  Coronary calcium score of 0.  Lipid panel obtained in the hospital showed a total cholesterol 137, triglyceride 44, HDL 49, LDL 79.  TSH was normal.  Outpatient heart monitor was recommended.  I last saw the patient for follow-up.  I changed her metoprolol tartrate to metoprolol succinate  25 mg twice a day to give her more coverage.  I also recommended a 30-day heart monitor.  Heart monitor revealed PVCs but no sustained arrhythmia.  She has not had any further prolonged palpitation, she does feel occasional skipped heartbeat consistent with PVC.  I discussed with the patient possibility of consider a Kardia mobile device.  She can follow-up in 5 to 11-month with Dr. Allyson Sabal.    ROS:   She denies chest pain, dyspnea, pnd, orthopnea, n, v, dizziness, syncope, edema, weight gain, or early satiety. All other systems reviewed and are otherwise negative except as noted above.   She has occasional palpitation  Studies Reviewed: .        Cardiac Studies & Procedures     STRESS TESTS  NM MYOCAR MULTI W/SPECT W 10/20/2019  Narrative CLINICAL DATA:  62 year old who presented with chest pain and had an abnormal EKG demonstrating INFERIOR and LATERAL wall ischemia. Current history of supraventricular tachycardia. Family history of coronary artery disease.  EXAM: MYOCARDIAL IMAGING WITH SPECT (REST AND PHARMACOLOGIC-STRESS)  GATED LEFT VENTRICULAR WALL MOTION STUDY  LEFT VENTRICULAR EJECTION FRACTION  TECHNIQUE: Standard myocardial SPECT imaging was performed after resting intravenous injection of 10 mCi Tc-60m tetrofosmin. Subsequently, intravenous infusion of Lexiscan was performed under the supervision of the Cardiology staff. At peak effect of the drug, 31 mCi Tc-41m tetrofosmin was injected intravenously and standard myocardial SPECT imaging was performed. Quantitative gated imaging was also performed to evaluate left ventricular wall motion, and estimate left ventricular ejection fraction.  COMPARISON:  None.  FINDINGS: Perfusion:  Diminished perfusion in the apex on the post Lexiscan and resting images without evidence of reversibility. No perfusion defects elsewhere in the LEFT ventricular myocardium.  Wall Motion: Normal left ventricular wall motion. No  left ventricular dilation.  Left Ventricular Ejection Fraction: 75%  End diastolic volume 79 ml  End systolic volume 19 ml  IMPRESSION: 1. Apical thinning due to diaphragmatic and breast attenuation versus focal apical infarct. No evidence of myocardial ischemia.  2. Normal left ventricular wall motion.  3. Left ventricular ejection fraction 75%  4. Non invasive risk stratification*: Low.  *2012 Appropriate Use Criteria for Coronary Revascularization Focused Update: J Am Coll Cardiol. 2012;59(9):857-881. http://content.dementiazones.com.aspx?articleid=1201161   Electronically Signed By: Hulan Saas M.D. On: 10/20/2019 11:35   ECHOCARDIOGRAM  ECHOCARDIOGRAM COMPLETE 01/24/2023  Narrative ECHOCARDIOGRAM REPORT    Patient Name:   [name] Date of Exam: 01/24/2023 Medical Rec #:  [id number]              Height:       66.0 in Accession #:    [id number]             Weight:       189.0 lb Date of Birth:  1961-09-06              BSA:          1.952 m Patient Age:    61 years               BP:           110/68 mmHg Patient Gender: F                      HR:           58 bpm. Exam Location:  [address]  Procedure: 2D Echo, Color Doppler, Cardiac Doppler and 3D Echo  Indications:    History of Mitral Valve Prolapse Z86.79  History:        Patient has no prior history of Echocardiogram examinations. Signs/Symptoms:Murmur.  Sonographer:    Thurman Coyer RDCS Referring Phys: [id number] [facility]   Sonographer Comments: Global longitudinal strain was attempted. IMPRESSIONS   1. Left ventricular ejection fraction, by estimation, is 55 to 60%. Left ventricular ejection fraction by 3D volume is 58 %. The left ventricle has normal function. The left ventricle has no regional wall motion abnormalities. Left ventricular diastolic parameters are indeterminate. 2. Right ventricular systolic function is normal. The right ventricular size is  mildly enlarged. There is normal pulmonary artery systolic pressure. 3. The mitral valve is degenerative. Trivial mitral valve regurgitation. No evidence of mitral stenosis. 4. The aortic valve is tricuspid. Aortic valve regurgitation is not visualized. Aortic valve sclerosis is present, with no evidence of aortic valve stenosis. 5. The inferior vena cava is normal in size with greater than 50% respiratory variability, suggesting right atrial pressure of 3 mmHg.  FINDINGS Left Ventricle: Left ventricular ejection fraction, by estimation, is 55 to 60%. Left ventricular ejection fraction by 3D volume is 58 %. The left ventricle has normal function. The left ventricle has no regional wall motion abnormalities. The left ventricular internal cavity size was normal in size. There is no left ventricular hypertrophy. Left ventricular diastolic parameters are indeterminate. Normal left ventricular filling pressure.  Right Ventricle: The right ventricular size is mildly enlarged. No increase in right ventricular wall thickness. Right ventricular systolic function is normal. There is normal pulmonary artery systolic pressure. The tricuspid regurgitant velocity  is 1.92 m/s, and with an assumed right atrial pressure of 3 mmHg, the estimated right ventricular systolic pressure is 17.7 mmHg.  Left Atrium: Left atrial size was normal in size.  Right Atrium: Right atrial size was normal in size.  Pericardium: There is no evidence of pericardial effusion.  Mitral Valve: The mitral valve is degenerative in appearance. Mild mitral annular calcification. Trivial mitral valve regurgitation. No evidence of mitral valve stenosis.  Tricuspid Valve: The tricuspid valve is normal in structure. Tricuspid valve regurgitation is mild . No evidence of tricuspid stenosis.  Aortic Valve: The aortic valve is tricuspid. Aortic valve regurgitation is not visualized. Aortic valve sclerosis is present, with no evidence of aortic  valve stenosis.  Pulmonic Valve: The pulmonic valve was normal in structure. Pulmonic valve regurgitation is trivial. No evidence of pulmonic stenosis.  Aorta: The aortic root is normal in size and structure.  Venous: The inferior vena cava is normal in size with greater than 50% respiratory variability, suggesting right atrial pressure of 3 mmHg.  IAS/Shunts: No atrial level shunt detected by color flow Doppler.   LEFT VENTRICLE PLAX 2D LVIDd:         4.20 cm         Diastology LVIDs:         2.80 cm         LV e' medial:    5.00 cm/s LV PW:         0.90 cm         LV E/e' medial:  11.8 LV IVS:        0.90 cm         LV e' lateral:   9.68 cm/s LVOT diam:     2.00 cm         LV E/e' lateral: 6.1 LV SV:         67 LV SV Index:   34 LVOT Area:     3.14 cm        3D Volume EF LV 3D EF:    Left ventricul ar ejection fraction by 3D volume is 58 %.  3D Volume EF: 3D EF:        58 % LV EDV:       89 ml LV ESV:       37 ml LV SV:        52 ml  RIGHT VENTRICLE RV Basal diam:  4.30 cm RV Mid diam:    3.10 cm RV S prime:     10.80 cm/s TAPSE (M-mode): 2.6 cm  LEFT ATRIUM             Index        RIGHT ATRIUM           Index LA diam:        3.20 cm 1.64 cm/m   RA Area:     19.30 cm LA Vol (A2C):   45.1 ml 23.10 ml/m  RA Volume:   51.40 ml  26.33 ml/m LA Vol (A4C):   47.0 ml 24.07 ml/m LA Biplane Vol: 48.7 ml 24.94 ml/m AORTIC VALVE LVOT Vmax:   91.30 cm/s LVOT Vmean:  57.800 cm/s LVOT VTI:    0.213 m  AORTA Ao Root diam: 2.90 cm Ao Asc diam:  2.90 cm  MITRAL VALVE               TRICUSPID VALVE MV Area (PHT): 3.31 cm    TR Peak grad:   14.7  mmHg MV Decel Time: 229 msec    TR Vmax:        192.00 cm/s MV E velocity: 59.10 cm/s MV A velocity: 63.80 cm/s  SHUNTS MV E/A ratio:  0.93        Systemic VTI:  0.21 m Systemic Diam: 2.00 cm  Armanda Magic MD Electronically signed by Armanda Magic MD Signature Date/Time: 01/24/2023/6:05:42 PM    Final     MONITORS  CARDIAC EVENT MONITOR 04/25/2023  Narrative SR/SB/ST Occasional PVCs Rare short runs of NSVT           Risk Assessment/Calculations:             Physical Exam:   VS:  BP 106/78 (BP Location: Left Arm, Patient Position: Sitting, Cuff Size: Large)   Pulse 72   Ht 5\' 6"  (1.676 m)   Wt 192 lb 12.8 oz (87.5 kg)   LMP 09/17/2016 (Approximate)   BMI 31.12 kg/m    Wt Readings from Last 3 Encounters:  06/06/23 192 lb 12.8 oz (87.5 kg)  05/05/23 195 lb (88.5 kg)  03/04/23 196 lb (88.9 kg)    GEN: Well nourished, well developed in no acute distress NECK: No JVD; No carotid bruits CARDIAC: RRR, no murmurs, rubs, gallops RESPIRATORY:  Clear to auscultation without rales, wheezing or rhonchi  ABDOMEN: Soft, non-tender, non-distended EXTREMITIES:  No edema; No deformity   ASSESSMENT AND PLAN: .    Palpitation: During the last office visit, I changed her metoprolol to tartrate to metoprolol succinate to give her more coverage.  30-day heart monitor shows no significant sustained arrhythmia.  She was previously seen in [facility] ED in June 2024 due to sustained tachyarrhythmia.  Symptom has not recurred again.  She does have symptomatic PVC.  We discussed Kardia mobile device to continue to monitor  Hypertension: Blood pressure well-controlled       Dispo: Follow-up in 6 months  Signed, Azalee Course, [place]

## 2023-06-13 ENCOUNTER — Other Ambulatory Visit: Payer: Self-pay | Admitting: Obstetrics and Gynecology

## 2023-06-13 DIAGNOSIS — Z1231 Encounter for screening mammogram for malignant neoplasm of breast: Secondary | ICD-10-CM

## 2023-06-27 DIAGNOSIS — F4310 Post-traumatic stress disorder, unspecified: Secondary | ICD-10-CM | POA: Diagnosis not present

## 2023-06-27 DIAGNOSIS — F331 Major depressive disorder, recurrent, moderate: Secondary | ICD-10-CM | POA: Diagnosis not present

## 2023-06-27 DIAGNOSIS — F411 Generalized anxiety disorder: Secondary | ICD-10-CM | POA: Diagnosis not present

## 2023-07-04 ENCOUNTER — Telehealth: Payer: Self-pay | Admitting: *Deleted

## 2023-07-04 ENCOUNTER — Other Ambulatory Visit: Payer: Self-pay | Admitting: Orthopedic Surgery

## 2023-07-04 DIAGNOSIS — S83209A Unspecified tear of unspecified meniscus, current injury, unspecified knee, initial encounter: Secondary | ICD-10-CM

## 2023-07-04 DIAGNOSIS — M25561 Pain in right knee: Secondary | ICD-10-CM | POA: Diagnosis not present

## 2023-07-04 NOTE — Telephone Encounter (Signed)
   Pre-operative Risk Assessment    Patient Name: [name]  DOB: 03-29-1961 MRN: [id number]      Request for Surgical Clearance    Procedure:   RIGHT KNEE SCOPE  Date of Surgery:  Clearance TBD                                 Surgeon:  Margarita Rana, MD Surgeon's Group or Practice Name:  [facility] Phone number:  [phone] Fax number:  [phone]   Type of Clearance Requested:   - Medical  - Pharmacy:  Hold Aspirin NOT INDICATED   Type of Anesthesia:   CHOICE   Additional requests/questions:    Wilhemina Cash   07/04/2023, 12:22 PM

## 2023-07-04 NOTE — Telephone Encounter (Signed)
     Primary Cardiologist: Nanetta Batty, MD  Chart reviewed as part of pre-operative protocol coverage. Given past medical history and time since last visit, based on ACC/AHA guidelines, [name] would be at acceptable risk for the planned procedure without further cardiovascular testing.   Her RCRI is low risk, 0.9% risk of major cardiac event.  Patient's aspirin is not prescribed by cardiology.  Recommendations for holding aspirin will need to come from prescribing provider.  I will route this recommendation to the requesting party via Epic fax function and remove from pre-op pool.  Please call with questions.  Thomasene Ripple. Kamara Allan NP-C     07/04/2023, 1:02 PM [facility] [address] Office [phone] Fax [phone]

## 2023-07-07 ENCOUNTER — Encounter: Payer: Self-pay | Admitting: Orthopedic Surgery

## 2023-07-10 ENCOUNTER — Ambulatory Visit
Admission: RE | Admit: 2023-07-10 | Discharge: 2023-07-10 | Disposition: A | Discharge status: Home / Self Care | Payer: 59 | Source: Ambulatory Visit | Attending: Orthopedic Surgery | Admitting: Orthopedic Surgery

## 2023-07-10 DIAGNOSIS — M25561 Pain in right knee: Secondary | ICD-10-CM | POA: Diagnosis not present

## 2023-07-10 DIAGNOSIS — M1711 Unilateral primary osteoarthritis, right knee: Secondary | ICD-10-CM | POA: Diagnosis not present

## 2023-07-10 DIAGNOSIS — M23361 Other meniscus derangements, other lateral meniscus, right knee: Secondary | ICD-10-CM | POA: Diagnosis not present

## 2023-07-10 DIAGNOSIS — S83209A Unspecified tear of unspecified meniscus, current injury, unspecified knee, initial encounter: Secondary | ICD-10-CM

## 2023-07-10 DIAGNOSIS — M25461 Effusion, right knee: Secondary | ICD-10-CM | POA: Diagnosis not present

## 2023-07-11 ENCOUNTER — Ambulatory Visit
Admission: RE | Admit: 2023-07-11 | Discharge: 2023-07-11 | Disposition: A | Discharge status: Home / Self Care | Payer: 59 | Source: Ambulatory Visit | Attending: Obstetrics and Gynecology | Admitting: Obstetrics and Gynecology

## 2023-07-11 DIAGNOSIS — Z1231 Encounter for screening mammogram for malignant neoplasm of breast: Secondary | ICD-10-CM

## 2023-08-14 HISTORY — PX: KNEE ARTHROSCOPY W/ MENISCECTOMY: SHX1879

## 2023-08-15 DIAGNOSIS — H9313 Tinnitus, bilateral: Secondary | ICD-10-CM | POA: Diagnosis not present

## 2023-08-15 DIAGNOSIS — H90A31 Mixed conductive and sensorineural hearing loss, unilateral, right ear with restricted hearing on the contralateral side: Secondary | ICD-10-CM | POA: Insufficient documentation

## 2023-08-19 ENCOUNTER — Ambulatory Visit (INDEPENDENT_AMBULATORY_CARE_PROVIDER_SITE_OTHER): Payer: 59 | Admitting: Family Medicine

## 2023-08-19 ENCOUNTER — Ambulatory Visit (INDEPENDENT_AMBULATORY_CARE_PROVIDER_SITE_OTHER): Payer: 59

## 2023-08-19 ENCOUNTER — Encounter: Payer: Self-pay | Admitting: Family Medicine

## 2023-08-19 VITALS — BP 100/76 | HR 62 | Temp 97.9°F | Ht 66.0 in | Wt 191.6 lb

## 2023-08-19 DIAGNOSIS — R7303 Prediabetes: Secondary | ICD-10-CM

## 2023-08-19 DIAGNOSIS — M7752 Other enthesopathy of left foot: Secondary | ICD-10-CM | POA: Diagnosis not present

## 2023-08-19 DIAGNOSIS — M79672 Pain in left foot: Secondary | ICD-10-CM

## 2023-08-19 DIAGNOSIS — I10 Essential (primary) hypertension: Secondary | ICD-10-CM

## 2023-08-19 DIAGNOSIS — M19072 Primary osteoarthritis, left ankle and foot: Secondary | ICD-10-CM | POA: Diagnosis not present

## 2023-08-19 NOTE — Patient Instructions (Signed)
We are getting an xray today. We will be in contact with any abnormal results that require further attention.  We have placed a referral for you today.  Someone will be reaching out to get you scheduled.

## 2023-08-19 NOTE — Progress Notes (Signed)
   Acute Office Visit  Subjective:     Patient ID: [name], female    DOB: 06-Mar-1961, 62 y.o.   MRN: [id number]  Chief Complaint  Patient presents with   Foot Pain    Left foot     HPI Patient is in today for L foot pain for the last 2 days. Reports that she is caring for her mother with dementia, and stamped her foot in frustration, and has been having left heel pain since then. Reports increased pain with weightbearing, denies hearing any popping or cracking, denies bruising, erythema. Has taken OTC pain reliever with minimal relief. Denies any numbness, tingling, obvious swelling or deformity. Denies other concerns today. Medical history as outlined below.  Of note, patient has history of prediabetes, hypertension and states that blood sugars have been running in the 120s to 140s. She is not currently being treated with medications for this, she has never seen a dietitian, would like to try dietitian first.   ROS Per HPI      Objective:    BP 100/76 (BP Location: Left Arm, Patient Position: Sitting, Cuff Size: Normal)   Pulse 62   Temp 97.9 F (36.6 C) (Oral)   Ht 5\' 6"  (1.676 m)   Wt 191 lb 9.6 oz (86.9 kg)   LMP 09/17/2016 (Approximate)   SpO2 97%   BMI 30.93 kg/m    Physical Exam Vitals and nursing note reviewed.  Constitutional:      General: She is not in acute distress.    Appearance: Normal appearance.  HENT:     Head: Normocephalic and atraumatic.  Eyes:     Extraocular Movements: Extraocular movements intact.  Musculoskeletal:     Right foot: Normal range of motion. No deformity.     Left foot: Decreased range of motion. No deformity.       Feet:  Feet:     Comments: Area of mild swelling and tenderness.  No bruising, no erythema, full range of motion Neurological:     Mental Status: She is alert.     No results found for any visits on 08/19/23.      Assessment & Plan:  1. Primary hypertension  - Amb ref to Medical  Nutrition Therapy-MNT -Discussed stress management -Discussed the importance of getting good sleep to help control blood pressures -Continue current medication regimen  2. Left foot pain  - DG Foot Complete Left; Future -Education provided on plan for fasciitis -Will let me know if symptoms are not improving she would like to see sports medicine  3. Prediabetes  - Amb ref to Medical Nutrition Therapy-MNT -Discussed that nutritional therapy would be helpful in managing blood sugars and blood pressure   No orders of the defined types were placed in this encounter.   Return if symptoms worsen or fail to improve.  Moshe Cipro, FNP

## 2023-08-24 NOTE — Progress Notes (Signed)
I do not see anything obviously broken on your xray, nothing obviously out of place. I do see a little arthritis and it looks like you may have a bone spur on that heel that was probably aggravated with your injury. We are still waiting for the radiologist to read this for confirmation, but I wanted to let you know what I see.

## 2023-08-25 DIAGNOSIS — M2241 Chondromalacia patellae, right knee: Secondary | ICD-10-CM | POA: Diagnosis not present

## 2023-08-25 DIAGNOSIS — Y999 Unspecified external cause status: Secondary | ICD-10-CM | POA: Diagnosis not present

## 2023-08-25 DIAGNOSIS — S83281A Other tear of lateral meniscus, current injury, right knee, initial encounter: Secondary | ICD-10-CM | POA: Diagnosis not present

## 2023-08-25 DIAGNOSIS — X58XXXA Exposure to other specified factors, initial encounter: Secondary | ICD-10-CM | POA: Diagnosis not present

## 2023-08-25 DIAGNOSIS — G8918 Other acute postprocedural pain: Secondary | ICD-10-CM | POA: Diagnosis not present

## 2023-08-26 ENCOUNTER — Other Ambulatory Visit (HOSPITAL_COMMUNITY): Payer: Self-pay

## 2023-08-26 ENCOUNTER — Other Ambulatory Visit: Payer: Self-pay

## 2023-08-26 ENCOUNTER — Emergency Department (HOSPITAL_COMMUNITY)
Admission: EM | Admit: 2023-08-26 | Discharge: 2023-08-26 | Disposition: A | Discharge status: Home / Self Care | Payer: 59 | Attending: Emergency Medicine | Admitting: Emergency Medicine

## 2023-08-26 ENCOUNTER — Telehealth (HOSPITAL_COMMUNITY): Payer: Self-pay | Admitting: Emergency Medicine

## 2023-08-26 DIAGNOSIS — E860 Dehydration: Secondary | ICD-10-CM | POA: Insufficient documentation

## 2023-08-26 DIAGNOSIS — Z7982 Long term (current) use of aspirin: Secondary | ICD-10-CM | POA: Insufficient documentation

## 2023-08-26 DIAGNOSIS — Z79899 Other long term (current) drug therapy: Secondary | ICD-10-CM | POA: Diagnosis not present

## 2023-08-26 DIAGNOSIS — M25561 Pain in right knee: Secondary | ICD-10-CM | POA: Insufficient documentation

## 2023-08-26 DIAGNOSIS — I10 Essential (primary) hypertension: Secondary | ICD-10-CM | POA: Diagnosis not present

## 2023-08-26 DIAGNOSIS — R55 Syncope and collapse: Secondary | ICD-10-CM | POA: Diagnosis not present

## 2023-08-26 LAB — CBC WITH DIFFERENTIAL/PLATELET
Abs Immature Granulocytes: 0.06 10*3/uL (ref 0.00–0.07)
Basophils Absolute: 0 10*3/uL (ref 0.0–0.1)
Basophils Relative: 0 %
Eosinophils Absolute: 0 10*3/uL (ref 0.0–0.5)
Eosinophils Relative: 0 %
HCT: 41.5 % (ref 36.0–46.0)
Hemoglobin: 13.1 g/dL (ref 12.0–15.0)
Immature Granulocytes: 0 %
Lymphocytes Relative: 6 %
Lymphs Abs: 0.9 10*3/uL (ref 0.7–4.0)
MCH: 24.3 pg — ABNORMAL LOW (ref 26.0–34.0)
MCHC: 31.6 g/dL (ref 30.0–36.0)
MCV: 77.1 fL — ABNORMAL LOW (ref 80.0–100.0)
Monocytes Absolute: 1.5 10*3/uL — ABNORMAL HIGH (ref 0.1–1.0)
Monocytes Relative: 9 %
Neutro Abs: 13.7 10*3/uL — ABNORMAL HIGH (ref 1.7–7.7)
Neutrophils Relative %: 85 %
Platelets: 213 10*3/uL (ref 150–400)
RBC: 5.38 MIL/uL — ABNORMAL HIGH (ref 3.87–5.11)
RDW: 14.6 % (ref 11.5–15.5)
WBC: 16.3 10*3/uL — ABNORMAL HIGH (ref 4.0–10.5)
nRBC: 0 % (ref 0.0–0.2)

## 2023-08-26 LAB — COMPREHENSIVE METABOLIC PANEL
ALT: 26 U/L (ref 0–44)
AST: 29 U/L (ref 15–41)
Albumin: 4 g/dL (ref 3.5–5.0)
Alkaline Phosphatase: 62 U/L (ref 38–126)
Anion gap: 8 (ref 5–15)
BUN: 12 mg/dL (ref 8–23)
CO2: 25 mmol/L (ref 22–32)
Calcium: 8.9 mg/dL (ref 8.9–10.3)
Chloride: 104 mmol/L (ref 98–111)
Creatinine, Ser: 0.81 mg/dL (ref 0.44–1.00)
GFR, Estimated: >60 mL/min (ref >60)
Glucose, Bld: 143 mg/dL — ABNORMAL HIGH (ref 70–99)
Potassium: 3.8 mmol/L (ref 3.5–5.1)
Sodium: 137 mmol/L (ref 135–145)
Total Bilirubin: 0.6 mg/dL (ref <1.2)
Total Protein: 7.1 g/dL (ref 6.5–8.1)

## 2023-08-26 MED ORDER — HYDROMORPHONE HCL 1 MG/ML IJ SOLN
0.5000 mg | Freq: Once | INTRAMUSCULAR | Status: AC
  Administered 2023-08-26: 0.5 mg via INTRAVENOUS
  Filled 2023-08-26: qty 1

## 2023-08-26 MED ORDER — HYDROMORPHONE HCL 4 MG PO TABS: ORAL_TABLET | ORAL | 0 refills | Status: DC

## 2023-08-26 MED ORDER — OXYCODONE HCL 5 MG PO TABS
5.0000 mg | ORAL_TABLET | ORAL | 0 refills | Status: DC | PRN
  Filled 2023-08-26: qty 10, 3d supply, fill #0

## 2023-08-26 MED ORDER — SODIUM CHLORIDE 0.9 % IV BOLUS
1000.0000 mL | Freq: Once | INTRAVENOUS | Status: AC
  Administered 2023-08-26: 1000 mL via INTRAVENOUS

## 2023-08-26 NOTE — ED Provider Notes (Signed)
[place] EMERGENCY DEPARTMENT AT [facility] Provider Note   CSN: [id number] Arrival date & time: 08/26/23  4098     History {Add pertinent medical, surgical, social history, OB history to HPI:1} Chief Complaint  Patient presents with   Loss of Consciousness    [name] is a 62 y.o. female.  Patient had knee surgery yesterday and today she felt dizzy and passed out.  She also states her pain medicine does not seem to be helping.  Patient has a history of hypertension   Loss of Consciousness      Home Medications Prior to Admission medications   Medication Sig Start Date End Date Taking? Authorizing Provider  HYDROmorphone (DILAUDID) 4 MG tablet Take 1 every 6 hours for pain not relieved by Tylenol alone 08/26/23  Yes Bethann Berkshire, MD  Ascorbic Acid (VITAMIN C) 1000 MG tablet Take 2,000 mg by mouth at bedtime.    [provider]  aspirin EC 81 MG EC tablet Take 1 tablet (81 mg total) by mouth daily. 10/21/19   Orpah Cobb, MD  Calcium Carb-Cholecalciferol (OYSTER SHELL CALCIUM 250+D PO) Take 1 tablet by mouth at bedtime.    [provider]  Cholecalciferol (VITAMIN D) 50 MCG (2000 UT) tablet Take 3,000 Units by mouth at bedtime.    [provider]  KLOR-CON M20 20 MEQ tablet TAKE 2 TABLETS (40 MEQ TOTAL) BY MOUTH DAILY. TAKE 2 TABLETS BY MOUTH EVERY DAY 03/02/23   Georgina Quint, MD  losartan (COZAAR) 50 MG tablet Take 25 mg by mouth 2 (two) times daily.    [provider]  metoprolol succinate (TOPROL XL) 25 MG 24 hr tablet Take 1 tablet (25 mg total) by mouth in the morning and at bedtime. 03/04/23   Azalee Course, PA  pantoprazole (PROTONIX) 40 MG tablet Take 1 tablet (40 mg total) by mouth daily. Patient taking differently: Take 40 mg by mouth as needed. 11/08/22   Georgina Quint, MD  PARoxetine (PAXIL-CR) 25 MG 24 hr tablet Take 25 mg by mouth daily.    [provider]  Polyethyl Glycol-Propyl  Glycol (SYSTANE OP) Place 1 drop into both eyes as needed.    [provider]  TURMERIC PO Take 1 capsule by mouth daily as needed (pain/inflammation).    [provider]      Allergies    Doxycycline    Review of Systems   Review of Systems  Cardiovascular:  Positive for syncope.    Physical Exam Updated Vital Signs BP 125/73 (BP Location: Left Arm)   Pulse 65   Temp 98.2 F (36.8 C) (Oral)   Resp 16   Ht 5\' 6"  (1.676 m)   Wt 87.5 kg   LMP 09/17/2016 (Approximate)   SpO2 100%   BMI 31.15 kg/m  Physical Exam  ED Results / Procedures / Treatments   Labs (all labs ordered are listed, but only abnormal results are displayed) Labs Reviewed  CBC WITH DIFFERENTIAL/PLATELET - Abnormal; Notable for the following components:      Result Value   WBC 16.3 (*)    RBC 5.38 (*)    MCV 77.1 (*)    MCH 24.3 (*)    Neutro Abs 13.7 (*)    Monocytes Absolute 1.5 (*)    All other components within normal limits  COMPREHENSIVE METABOLIC PANEL - Abnormal; Notable for the following components:   Glucose, Bld 143 (*)    All other components within normal limits  EKG None  Radiology No results found.  Procedures Procedures  {Document cardiac monitor, telemetry assessment procedure when appropriate:1}  Medications Ordered in ED Medications  sodium chloride 0.9 % bolus 1,000 mL (0 mLs Intravenous Stopped 08/26/23 1011)  HYDROmorphone (DILAUDID) injection 0.5 mg (0.5 mg Intravenous Given 08/26/23 1010)    ED Course/ Medical Decision Making/ A&P   {   Click here for ABCD2, HEART and other calculatorsREFRESH Note before signing :1}                              Medical Decision Making Amount and/or Complexity of Data Reviewed Labs: ordered.  Risk Prescription drug management.   Patient with syncope and continued pain in her right knee.  Patient was mildly dehydrated and has been hydrated with saline.  She is alert and oriented and will be discharged home  on Percocets for pain for her knee instead of the Vicodin she has been taking  {Document critical care time when appropriate:1} {Document review of labs and clinical decision tools ie heart score, Chads2Vasc2 etc:1}  {Document your independent review of radiology images, and any outside records:1} {Document your discussion with family members, caretakers, and with consultants:1} {Document social determinants of health affecting pt's care:1} {Document your decision making why or why not admission, treatments were needed:1} Final Clinical Impression(s) / ED Diagnoses Final diagnoses:  Syncope and collapse  Dehydration    Rx / DC Orders ED Discharge Orders          Ordered    HYDROmorphone (DILAUDID) 4 MG tablet        08/26/23 1046

## 2023-08-26 NOTE — Telephone Encounter (Cosign Needed)
Patient states her pharmacy does not have the Dilaudid prescription sent in by previous provider.  She was also prescribed 10 mg Vicodin yesterday by orthopedics.  She states the Vicodin did not help.  4 mg of Dilaudid seems to be very high dosage for this patient.  Patient agreeable for 5 mg Roxicodone and she will combine this with Tylenol as needed.  Sent to [facility] pharmacy.

## 2023-08-26 NOTE — Discharge Instructions (Addendum)
Drink plenty of fluids and rest.  You have been given a new pain medicine.  Do not take both pain medicines.  Follow-up with your doctor as planned

## 2023-08-26 NOTE — ED Triage Notes (Signed)
PT BIB EMS coming from home c/o passing and hitting her head while going to the bathroom this morning. Pt is post-op, had an ultrascopic knee procedure done yesterday and had been having uncontrolled pain. VSS  BP 123/65, HR 97, Spo2 98% RA, CBG 166

## 2023-08-29 DIAGNOSIS — F331 Major depressive disorder, recurrent, moderate: Secondary | ICD-10-CM | POA: Diagnosis not present

## 2023-08-29 DIAGNOSIS — F4310 Post-traumatic stress disorder, unspecified: Secondary | ICD-10-CM | POA: Diagnosis not present

## 2023-08-29 DIAGNOSIS — F411 Generalized anxiety disorder: Secondary | ICD-10-CM | POA: Diagnosis not present

## 2023-08-31 DIAGNOSIS — S83271D Complex tear of lateral meniscus, current injury, right knee, subsequent encounter: Secondary | ICD-10-CM | POA: Diagnosis not present

## 2023-08-31 DIAGNOSIS — M6281 Muscle weakness (generalized): Secondary | ICD-10-CM | POA: Diagnosis not present

## 2023-08-31 DIAGNOSIS — M25661 Stiffness of right knee, not elsewhere classified: Secondary | ICD-10-CM | POA: Diagnosis not present

## 2023-08-31 DIAGNOSIS — R262 Difficulty in walking, not elsewhere classified: Secondary | ICD-10-CM | POA: Diagnosis not present

## 2023-09-09 ENCOUNTER — Other Ambulatory Visit: Payer: Self-pay | Admitting: Physician Assistant

## 2023-09-13 ENCOUNTER — Encounter: Payer: 59 | Attending: Family Medicine | Admitting: Skilled Nursing Facility1

## 2023-09-13 ENCOUNTER — Encounter: Payer: Self-pay | Admitting: Skilled Nursing Facility1

## 2023-09-13 ENCOUNTER — Ambulatory Visit: Payer: 59 | Admitting: Skilled Nursing Facility1

## 2023-09-13 VITALS — Ht 66.0 in | Wt 190.0 lb

## 2023-09-13 DIAGNOSIS — R7303 Prediabetes: Secondary | ICD-10-CM | POA: Insufficient documentation

## 2023-09-13 NOTE — Progress Notes (Signed)
 Medical Nutrition Therapy  Appointment Start time:  7:55  Appointment End time:  8:45  Primary concerns today: to be under 190 pounds and feel in balance   Referral diagnosis: HTN, prediabetes Preferred learning style: auditory Learning readiness: contemplating   NUTRITION ASSESSMENT    Clinical Medical Hx: prediabetes, HTN, GERD Medications: see list Labs: A1C 5.8 Notable Signs/Symptoms: pt states she has IBS-C (has gotten better since eating more vegetables)  Lifestyle & Dietary Hx  Pt states she wants to figure out why she is not losing weight and since cutting out sugar and carb has felt so weak and only eating vegetables and protein.  Pt states she loves southern food. 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   Body Composition Scale Date  Current Body Weight 190  Total Body Fat % 39.1  Visceral Fat 11  Fat-Free Mass % 60.8   Total Body Water % 44.9  Muscle-Mass lbs 29.9  BMI 30.5  Body Fat Displacement          Torso  lbs 46         Left Leg  lbs 9.2         Right Leg  lbs 9.2         Left Arm  lbs 4.6         Right Arm   lbs 4.6     Estimated daily fluid intake: 32+ oz Supplements: vitamin C, D, calcium , tumeric, sea moss Sleep: pt states not great due to neck pain; reporting about 6-7 hours Stress / self-care: states her mother has dementia whom is in a facility  Current average weekly physical activity: trying to walk and exercise her newly surgery knee which she is doing PT  24-Hr Dietary Recall: in bed around 11:30-12am First Meal: egg sandwich with bacon or oatmeal + sugar or honey and margarine with boiled egg Snack:  Second Meal: skipped  Snack 4pm: baked chicken + yams or sweet potato souffle or fried okra or green benas Third Meal:  Snack 9pm: sandwich or chips Beverages: water, grape juice, tea, sprite   Estimated Energy Needs Calories: 1500-1800   NUTRITION DIAGNOSIS  NB-1.1 Food and  nutrition-related knowledge deficit As related to newly diagnosed prediabetes.  As evidenced by pt referral.   NUTRITION INTERVENTION  Nutrition education (E-1) on the following topics:  Why should you not wait past 5 hours to eat?: Eating 3-5 hours after your meals will help control blood glucose (sugar) levels and helps control your hunger. Having too large of a gap between meals will cause you to be very hungry and eat too much; this can make you sick if you have had bariatric surgery and/or can cause larger fluctuations in your blood glucose levels if you have diabetes. Those who struggle with hypoglycemia (low blood glucose) will experience more hypoglycemic events if they do not have a consistent meal schedule.  NCM, [email or web address],  Educated pt on pathophysiology prediabetes  Why you need complex carbohydrates: Whole grains and other complex carbohydrates are required to have a healthy diet. Whole grains provide fiber which can help with blood glucose levels and help keep you satiated. Fruits and starchy vegetables provide essential vitamins and minerals required for immune function, eyesight support, brain support, bone density, wound healing and many other functions within the body. According to the current evidenced based 2020-2025 Dietary Guidelines for Americans, complex carbohydrates are part of a healthy eating pattern which is associated with  a decreased risk for type 2 diabetes, cancers, and cardiovascular disease.  To discuss next time: macronutrient distribution and plate building   Handouts Provided Include  Snack ideas list Body comp print out   Learning Style & Readiness for Change Teaching method utilized: Visual & Auditory  Demonstrated degree of understanding via: Teach Back  Barriers to learning/adherence to lifestyle change: reported stress level due to her difficulty in  saying no and being pulled in too many directions   Goals Established by Pt I am going to work on saying no I am going to work on choosing ONLY 1 project per day not starting the next one until the first is completed I am going to follow the structured eating plan of eating every 3-5 hours starting within 1-1.5 hours of waking  I am going to do some journaling to help clear my mind    MONITORING & EVALUATION Dietary intake, weekly physical activity  Next Steps  Patient is to follow up in 1 month.

## 2023-09-19 ENCOUNTER — Encounter: Payer: Self-pay | Admitting: Nurse Practitioner

## 2023-09-19 ENCOUNTER — Ambulatory Visit: Payer: 59 | Admitting: Nurse Practitioner

## 2023-09-19 VITALS — BP 100/70 | HR 72 | Ht 66.0 in | Wt 190.5 lb

## 2023-09-19 DIAGNOSIS — F4310 Post-traumatic stress disorder, unspecified: Secondary | ICD-10-CM | POA: Diagnosis not present

## 2023-09-19 DIAGNOSIS — F411 Generalized anxiety disorder: Secondary | ICD-10-CM | POA: Diagnosis not present

## 2023-09-19 DIAGNOSIS — Z860101 Personal history of adenomatous and serrated colon polyps: Secondary | ICD-10-CM

## 2023-09-19 DIAGNOSIS — K219 Gastro-esophageal reflux disease without esophagitis: Secondary | ICD-10-CM

## 2023-09-19 DIAGNOSIS — F331 Major depressive disorder, recurrent, moderate: Secondary | ICD-10-CM | POA: Diagnosis not present

## 2023-09-19 DIAGNOSIS — R1319 Other dysphagia: Secondary | ICD-10-CM

## 2023-09-19 DIAGNOSIS — R1310 Dysphagia, unspecified: Secondary | ICD-10-CM

## 2023-09-19 NOTE — Progress Notes (Signed)
 ASSESSMENT    Brief Narrative:  63 y.o.  female known to [name]  with a past medical history not limited to colon polyps, IBS, GERD, OSA, PSVT, hypertension, benign thyroid  nodules  GERD. Several month history of pyrosis and throat clearing.   Pill dysphagia  Postprandial irregular heart beats with transient shortness of breath. Describes skipped heart beats after eating Patient inquires about Roemheld syndrome. I am not familiar with this syndrome. Based on a quick literature search it is described as cardiovascular symptoms resulting from gastrointestinal compression of the heart.She reportedly discussed this with Cardiology and they were not familiar with this syndrome either.   History of adenomatous colon polyps.  Up-to-date on surveillance colonoscopy Small tubular adenoma removed April 2024.  See [facility] for any additional medical & surgical history   PLAN   - Discussed anti-reflux measures such as avoidance of late meals / bedtime snacks, HOB elevation (or use of wedge pillow), weight reduction ( if applicable)  / maintaining a healthy BMI ( body mass index),  and avoidance of trigger foods and caffeine.  - Continue pantoprazole  40 mg 30 minutes before breakfast - It is reasonable to give PPI  a little more time to work ( started only a week ago). However, she is concerned and wants to proceed with an EGD. The risks and benefits of EGD with possible biopsies were discussed with the patient who agrees to proceed.  --I will talk with patient's primary GI, [name] to see if he is familiar with Roemheld syndrome and how to go about evaluating for it.    HPI   Chief complaint : heartburn, throat clearing, pills sticking in esophagus  Brief GI History :  Patient was last seen at time of her surveillance colonoscopy in April 2024   Interval History ;  [name] gives a several month history of frequent throat clearing, heartburn, hoarse voice and also pill dysphagia.   About a week ago she resumed reflux medicine (had been off medication for a very long time).  She feels like there have been some changes in her esophagus and that something is not right.  She has a history of thyroid  nodules but feels symptoms are unrelated. She feels like her esophagus has become more narrow than it should be.  She does not have any problems swallowing food or liquids.   [name] mentions that for the last two year her heart starts skipping beats after eating. This can go on for hours. She gets transient associated SHOB.  She researched this and found out that there was a condition called Roemheld syndrome which trigers cardiovascular symptoms due to gastrointestinal compression of the heart. She discussed this with Cardiology who apparently wasn't familiar with this syndrome.   [name] has a history of PSVT followed by Cardiology. Most recent Cardiology office note from 05/06/23 was reviewed.  According to that note patient was seen in June 2024 at [facility] ED for atypical chest pain felt to be due to tachyarrhythmia.  She had positive enzymes.  Cardiology was consulted. Limited echocardiogram showed EF 55 to 60%, no regional wall motion abnormality, mild TR, no pericardial effusion. Coronary CT performed on 02/28/2023 showed normal coronary arteries, no coronary artery disease. Coronary calcium  score of 0.  TSH was normal.  Cardiology has since increased her beta-blocker . She had a 30-day heart monitor study which showed some PVCs but no sustained arrhythmia.    GI History / Pertinent GI Studies   **All endoscopic  studies may not be included here    Surveillance colonoscopy April 2024 -- One 5 mm splenic flexure polyp removed, small internal hemorrhoids Path : tubular adenoma  EGD 2013 for GERD and dysphagia -- Normal      Latest Ref Rng & Units 08/26/2023    7:57 AM 08/26/2022    4:24 PM 08/24/2022   11:16 PM  Hepatic Function  Total Protein 6.5 - 8.1 g/dL 7.1  6.5  6.8    Albumin 3.5 - 5.0 g/dL 4.0  4.1  4.0   AST 15 - 41 U/L 29  20  23    ALT 0 - 44 U/L 26  18  22    Alk Phosphatase 38 - 126 U/L 62  57  57   Total Bilirubin <1.2 mg/dL 0.6  0.5  0.6        Latest Ref Rng & Units 08/26/2023    7:57 AM 08/26/2022    4:24 PM 08/24/2022   11:16 PM  CBC  WBC 4.0 - 10.5 K/uL 16.3  4.9  5.5   Hemoglobin 12.0 - 15.0 g/dL 86.8  87.3  87.5   Hematocrit 36.0 - 46.0 % 41.5  40.3  40.7   Platelets 150 - 400 K/uL 213  193.0  192      Past Medical History:  Diagnosis Date   Anemia    Anxiety    Arrhythmia 2013   Colon polyp 08/28/2012   Tubular adenoma   Dense breast    Depression    Genital warts    GERD (gastroesophageal reflux disease)    Heart murmur    Hemorrhoids    Hx of cardiac catheterization 2009   normal coronary arteries   Hx of menorrhagia    Novasure   Hypokalemia    Hypomagnesemia    Hypophosphatemia    Lactose intolerance in adult    Meniscus degeneration, right    torn r knee/ 10/2019   Mitral valve prolapse    Normal cardiac stress test 03/09/2012   no ischemia   Positive PPD    Rapid heart beat    States irreg heart beat   Sleep apnea    Thyroid  disease    Thyroid  nodules    Past Surgical History:  Procedure Laterality Date   CARDIAC CATHETERIZATION     x2 with normal results per pt   NOVASURE ABLATION     UPPER GASTROINTESTINAL ENDOSCOPY  May 2013    Family History  Problem Relation Age of Onset   Heart disease Father        died Mi 24   Diabetes Maternal Grandmother    Hypertension Maternal Grandfather    Diabetes Maternal Grandfather    Heart disease Maternal Grandfather    Colon cancer Maternal Grandfather    Rectal cancer Maternal Grandfather    Prostate cancer Neg Hx    Breast cancer Neg Hx    Colon polyps Neg Hx    Esophageal cancer Neg Hx     Current Medications, Allergies, Family History and Social History were reviewed in [employer] medical record.     Current Outpatient  Medications  Medication Sig Dispense Refill   Ascorbic Acid (VITAMIN C) 1000 MG tablet Take 2,000 mg by mouth at bedtime.     aspirin  EC 81 MG EC tablet Take 1 tablet (81 mg total) by mouth daily.     Calcium  Carb-Cholecalciferol (OYSTER SHELL CALCIUM  250+D PO) Take 1 tablet by mouth at bedtime.  Cholecalciferol (VITAMIN D ) 50 MCG (2000 UT) tablet Take 3,000 Units by mouth at bedtime.     KLOR-CON  M20 20 MEQ tablet TAKE 2 TABLETS (40 MEQ TOTAL) BY MOUTH DAILY. TAKE 2 TABLETS BY MOUTH EVERY DAY 60 tablet 23   losartan  (COZAAR ) 50 MG tablet Take 25 mg by mouth 2 (two) times daily.     metoprolol  succinate (TOPROL -XL) 25 MG 24 hr tablet TAKE 1 TABLET (25 MG) BY MOUTH IN THE MORNING AND AT BEDTIME 60 tablet 11   oxyCODONE  (ROXICODONE ) 5 MG immediate release tablet Take 1 tablet (5 mg total) by mouth every 4 (four) hours as needed for severe pain (pain score 7-10). 10 tablet 0   pantoprazole  (PROTONIX ) 40 MG tablet Take 1 tablet (40 mg total) by mouth daily. (Patient taking differently: Take 40 mg by mouth as needed.) 30 tablet 3   PARoxetine  (PAXIL -CR) 25 MG 24 hr tablet Take 25 mg by mouth daily.     Polyethyl Glycol-Propyl Glycol (SYSTANE OP) Place 1 drop into both eyes as needed.     TURMERIC PO Take 1 capsule by mouth daily as needed (pain/inflammation).     No current facility-administered medications for this visit.    Review of Systems: No chest pain. No shortness of breath. No urinary complaints.    Physical Exam  Filed Weights   09/19/23 0911  Weight: 190 lb 8 oz (86.4 kg)   Wt Readings from Last 3 Encounters:  09/19/23 190 lb 8 oz (86.4 kg)  09/13/23 190 lb (86.2 kg)  08/26/23 193 lb (87.5 kg)    Ht 5' 6 (1.676 m)   Wt 190 lb 8 oz (86.4 kg)   LMP 09/17/2016 (Approximate)   BMI 30.75 kg/m  Constitutional:  Pleasant, generally well appearing female in no acute distress. Psychiatric: Normal mood and affect. Behavior is normal. EENT: Pupils normal.  Conjunctivae are  normal. No scleral icterus. Neck supple.  Cardiovascular: Normal rate, regular rhythm.  Pulmonary/chest: Effort normal and breath sounds normal. No wheezing, rales or rhonchi. Abdominal: Soft, nondistended, nontender. Bowel sounds active throughout. There are no masses palpable. No hepatomegaly. Neurological: Alert and oriented to person place and time.  Extremities: no edema  Vina Dasen, NP  09/19/2023, 9:14 AM  Cc:  Purcell Emil Schanz, *

## 2023-09-19 NOTE — Patient Instructions (Addendum)
 You have been scheduled for an endoscopy. Please follow written instructions given to you at your visit today.  If you use inhalers (even only as needed), please bring them with you on the day of your procedure.  If you take any of the following medications, they will need to be adjusted prior to your procedure:   DO NOT TAKE 7 DAYS PRIOR TO TEST- Trulicity (dulaglutide) Ozempic, Wegovy (semaglutide) Mounjaro (tirzepatide) Bydureon Bcise (exanatide extended release)  DO NOT TAKE 1 DAY PRIOR TO YOUR TEST Rybelsus (semaglutide) Adlyxin (lixisenatide) Victoza (liraglutide) Byetta (exanatide) ___________________________________________________________________________   Due to recent changes in healthcare laws, you may see the results of your imaging and laboratory studies on MyChart before your provider has had a chance to review them.  We understand that in some cases there may be results that are confusing or concerning to you. Not all laboratory results come back in the same time frame and the provider may be waiting for multiple results in order to interpret others.  Please give us  48 hours in order for your provider to thoroughly review all the results before contacting the office for clarification of your results.   Continue Pantoprazole   Due to recent changes in healthcare laws, you may see the results of your imaging and laboratory studies on MyChart before your provider has had a chance to review them.  We understand that in some cases there may be results that are confusing or concerning to you. Not all laboratory results come back in the same time frame and the provider may be waiting for multiple results in order to interpret others.  Please give us  48 hours in order for your provider to thoroughly review all the results before contacting the office for clarification of your results.   Thank you for choosing me and  Gastroenterology.  Vina Dasen, NP

## 2023-09-19 NOTE — Progress Notes (Signed)
 Addendum: Reviewed and agree with assessment and management plan. Asha Grumbine, Carie Caddy, MD

## 2023-09-22 DIAGNOSIS — A630 Anogenital (venereal) warts: Secondary | ICD-10-CM | POA: Diagnosis not present

## 2023-09-30 DIAGNOSIS — M6281 Muscle weakness (generalized): Secondary | ICD-10-CM | POA: Diagnosis not present

## 2023-09-30 DIAGNOSIS — S83271D Complex tear of lateral meniscus, current injury, right knee, subsequent encounter: Secondary | ICD-10-CM | POA: Diagnosis not present

## 2023-09-30 DIAGNOSIS — R262 Difficulty in walking, not elsewhere classified: Secondary | ICD-10-CM | POA: Diagnosis not present

## 2023-09-30 DIAGNOSIS — M25661 Stiffness of right knee, not elsewhere classified: Secondary | ICD-10-CM | POA: Diagnosis not present

## 2023-10-05 DIAGNOSIS — F331 Major depressive disorder, recurrent, moderate: Secondary | ICD-10-CM | POA: Diagnosis not present

## 2023-10-05 DIAGNOSIS — F411 Generalized anxiety disorder: Secondary | ICD-10-CM | POA: Diagnosis not present

## 2023-10-05 DIAGNOSIS — F4310 Post-traumatic stress disorder, unspecified: Secondary | ICD-10-CM | POA: Diagnosis not present

## 2023-10-05 DIAGNOSIS — M1711 Unilateral primary osteoarthritis, right knee: Secondary | ICD-10-CM | POA: Diagnosis not present

## 2023-10-06 ENCOUNTER — Ambulatory Visit: Payer: 59 | Admitting: Gastroenterology

## 2023-10-07 DIAGNOSIS — K529 Noninfective gastroenteritis and colitis, unspecified: Secondary | ICD-10-CM | POA: Diagnosis not present

## 2023-10-10 ENCOUNTER — Encounter: Payer: Self-pay | Admitting: Skilled Nursing Facility1

## 2023-10-10 ENCOUNTER — Encounter: Payer: 59 | Attending: Family Medicine | Admitting: Skilled Nursing Facility1

## 2023-10-10 VITALS — Ht 66.0 in | Wt 188.3 lb

## 2023-10-10 DIAGNOSIS — R7303 Prediabetes: Secondary | ICD-10-CM | POA: Diagnosis present

## 2023-10-10 NOTE — Progress Notes (Unsigned)
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 Pt states she got food poisoning lasting about 2 days.   Pt states she did add carbohydrates back in and is feeling better since adding them back in with increased energy.  Pt states she checks her blood sugars every now and then.   Pt wanted to discuss her blood sugars but will need to discuss at her next follow up appt as time did not allow.   Body Composition Scale Date 10/10/2023  Current Body Weight 190 188.3  Total Body Fat % 39.1 38.8  Visceral Fat 11   Fat-Free Mass % 60.8    Total Body Water % 44.9 45  Muscle-Mass lbs 29.9   BMI 30.5   Body Fat Displacement           Torso  lbs 46          Left Leg  lbs 9.2          Right Leg  lbs 9.2          Left Arm  lbs 4.6          Right Arm   lbs 4.6      Estimated daily fluid intake: 32+ oz Supplements: vitamin C, D, calcium, tumeric, sea moss Sleep: pt states not great due to neck pain; reporting about 6-7 hours Stress / self-care: states her mother has dementia whom is in a facility  Current average weekly physical activity: trying to walk and exercise her newly surgery knee which she is doing PT  24-Hr Dietary Recall: in bed around 11:30-12am First Meal: egg sandwich with bacon or oatmeal + sugar or honey and margarine with boiled egg Snack:  Second Meal: skipped  Snack 4pm: baked chicken + yams or sweet potato souffle or fried okra or green  benas Third Meal:  Snack 9pm: sandwich or chips Beverages: water, grape juice, tea, sprite   Estimated Energy Needs Calories: 1500-1800   NUTRITION DIAGNOSIS  NB-1.1 Food and nutrition-related knowledge deficit As related to newly diagnosed prediabetes.  As evidenced by pt referral.   NUTRITION INTERVENTION  Nutrition education (E-1) on the following topics: Continued  Why should you not wait past 5 hours to eat?: Eating 3-5 hours after your meals will help control blood glucose (sugar) levels and helps control your hunger. Having too large of a gap between meals will cause you to be very hungry and eat too much; this can make you sick if you have had bariatric surgery and/or can cause larger fluctuations in your blood glucose levels if you have diabetes. Those who struggle with hypoglycemia (low blood glucose) will experience more hypoglycemic events if they do not have a consistent meal schedule.  NCM, [email or web address],  Educated pt on pathophysiology prediabetes  Why you need complex carbohydrates: Whole grains and other complex carbohydrates are required to have a healthy diet. Whole grains provide fiber which can help with blood glucose levels and help keep you satiated. Fruits and starchy vegetables provide essential  vitamins and minerals required for immune function, eyesight support, brain support, bone density, wound healing and many other functions within the body. According to the current evidenced based 2020-2025 Dietary Guidelines for Americans, complex carbohydrates are part of a healthy eating pattern which is associated with a decreased risk for type 2 diabetes, cancers, and cardiovascular disease.  To discuss next time: macronutrient distribution and plate building   Handouts Previously Provided Include  Snack ideas list Body comp print out   Learning Style & Readiness for  Change Teaching method utilized: Visual & Auditory  Demonstrated degree of understanding via: Teach Back  Barriers to learning/adherence to lifestyle change: reported stress level due to her difficulty in saying no and being pulled in too many directions   Goals Established by Pt I am going to work on saying no: pt states that has been tougher to accomplish  Continue: I am going to work on choosing ONLY 1 project per day not starting the next one until the first is completed: pt states she is proud of this one and feels accomplished  I am going to follow the structured eating plan of eating every 3-5 hours starting within 1-1.5 hours of waking: Pt states she forgot about this one   Continue: I am going to do some journaling to help clear my mind: pt states she did not get to this one: revamped to specific journaling at night and recap things learned that day    MONITORING & EVALUATION Dietary intake, weekly physical activity  Next Steps  Patient is to follow up in 1 month.

## 2023-10-24 DIAGNOSIS — F331 Major depressive disorder, recurrent, moderate: Secondary | ICD-10-CM | POA: Diagnosis not present

## 2023-10-24 DIAGNOSIS — F4310 Post-traumatic stress disorder, unspecified: Secondary | ICD-10-CM | POA: Diagnosis not present

## 2023-10-24 DIAGNOSIS — F411 Generalized anxiety disorder: Secondary | ICD-10-CM | POA: Diagnosis not present

## 2023-11-01 ENCOUNTER — Encounter: Payer: Self-pay | Admitting: Internal Medicine

## 2023-11-09 ENCOUNTER — Encounter: Payer: Self-pay | Admitting: Internal Medicine

## 2023-11-09 ENCOUNTER — Ambulatory Visit (AMBULATORY_SURGERY_CENTER): Payer: 59 | Admitting: Internal Medicine

## 2023-11-09 VITALS — BP 94/61 | HR 63 | Temp 98.0°F | Resp 13 | Ht 66.0 in | Wt 190.0 lb

## 2023-11-09 DIAGNOSIS — K219 Gastro-esophageal reflux disease without esophagitis: Secondary | ICD-10-CM

## 2023-11-09 DIAGNOSIS — R1310 Dysphagia, unspecified: Secondary | ICD-10-CM | POA: Diagnosis not present

## 2023-11-09 DIAGNOSIS — K2289 Other specified disease of esophagus: Secondary | ICD-10-CM

## 2023-11-09 DIAGNOSIS — G4730 Sleep apnea, unspecified: Secondary | ICD-10-CM | POA: Diagnosis not present

## 2023-11-09 DIAGNOSIS — F32A Depression, unspecified: Secondary | ICD-10-CM | POA: Diagnosis not present

## 2023-11-09 DIAGNOSIS — K314 Gastric diverticulum: Secondary | ICD-10-CM | POA: Diagnosis not present

## 2023-11-09 DIAGNOSIS — R1319 Other dysphagia: Secondary | ICD-10-CM

## 2023-11-09 DIAGNOSIS — F419 Anxiety disorder, unspecified: Secondary | ICD-10-CM | POA: Diagnosis not present

## 2023-11-09 MED ORDER — PANTOPRAZOLE SODIUM 40 MG PO TBEC: 40.0000 mg | DELAYED_RELEASE_TABLET | Freq: Every day | ORAL | 3 refills | Status: AC

## 2023-11-09 MED ORDER — SODIUM CHLORIDE 0.9 % IV SOLN: 500.0000 mL | INTRAVENOUS | Status: DC

## 2023-11-09 NOTE — Op Note (Signed)
 [facility] Patient Name: [name] Procedure Date: 11/09/2023 10:05 AM MRN: [id number] Endoscopist: Beverley Fiedler , MD, [id number] Age: 63 Referring MD:  Date of Birth: 09-May-1961 Gender: Female Account #: [id number] Procedure:                Upper GI endoscopy Indications:              Dysphagia, Gastro-esophageal reflux disease with                            LPR symptoms (throat clearing) - intermittent                            pantoprazole use at present (pt reports benefit                            when taking) Medicines:                Monitored Anesthesia Care Procedure:                Pre-Anesthesia Assessment:                           - Prior to the procedure, a History and Physical                            was performed, and patient medications and                            allergies were reviewed. The patient's tolerance of                            previous anesthesia was also reviewed. The risks                            and benefits of the procedure and the sedation                            options and risks were discussed with the patient.                            All questions were answered, and informed consent                            was obtained. Prior Anticoagulants: The patient has                            taken no anticoagulant or antiplatelet agents. ASA                            Grade Assessment: II - A patient with mild systemic                            disease. After reviewing the risks and benefits,  the patient was deemed in satisfactory condition to                            undergo the procedure.                           After obtaining informed consent, the endoscope was                            passed under direct vision. Throughout the                            procedure, the patient's blood pressure, pulse, and                            oxygen saturations were monitored continuously.  The                            Olympus Scope [id number] was introduced through the                            mouth, and advanced to the second part of duodenum.                            The upper GI endoscopy was accomplished without                            difficulty. The patient tolerated the procedure                            well. Scope In: Scope Out: Findings:                 Mild mucosal changes characterized by granularity                            were found in the lower third of the esophagus.                            This is consistent with acid reflux change.                           No endoscopic abnormality was evident in the                            esophagus to explain the patient's complaint of                            dysphagia. It was decided, however, to proceed with                            dilation of the entire esophagus. The scope was                            withdrawn. Dilation  was performed with a Maloney                            dilator with mild resistance at 54 Fr.                           A small diverticulum was found in the gastric                            fundus.                           The exam of the stomach was otherwise normal.                           The examined duodenum was normal. Complications:            No immediate complications. Estimated Blood Loss:     Estimated blood loss: none. Impression:               - Granular mucosa in the esophagus related to acid                            reflux.                           - No endoscopic esophageal abnormality to explain                            patient's dysphagia. Esophagus dilated with 54 Fr                            Maloney.                           - Small gastric fundus diverticulum. Stomach                            otherwise normal.                           - Normal examined duodenum.                           - No specimens collected. Recommendation:            - Patient has a contact number available for                            emergencies. The signs and symptoms of potential                            delayed complications were discussed with the                            patient. Return to normal activities tomorrow.  Written discharge instructions were provided to the                            patient.                           - Post-dilation diet and then advance diet as                            tolerated.                           - Continue present medications. Pantoprazole 40 mg                            daily is recommended given reflux changes with LPR                            symptoms.                           - If swallowing trouble persists please contact my                            office for additional recommendations. Beverley Fiedler, MD 11/09/2023 10:33:17 AM This report has been signed electronically.

## 2023-11-09 NOTE — Progress Notes (Signed)
 Pt's states no medical or surgical changes since previsit or office visit.

## 2023-11-09 NOTE — Progress Notes (Signed)
 GASTROENTEROLOGY PROCEDURE H&P NOTE   Primary Care Physician: Georgina Quint, MD    Reason for Procedure:  GERD with pill dysphagia  Plan:    Upper endoscopy and possible dilation  Patient is appropriate for endoscopic procedure(s) in the ambulatory ([facility]) setting.  The nature of the procedure, as well as the risks, benefits, and alternatives were carefully and thoroughly reviewed with the patient. Ample time for discussion and questions allowed. The patient understood, was satisfied, and agreed to proceed.     HPI: [name] is a 63 y.o. female who presents for EGD with possible dilation.  Medical history as below.  No recent chest pain or shortness of breath.  No abdominal pain today.  Past Medical History:  Diagnosis Date   Anemia    Anxiety    Arrhythmia 2013   Colon polyp 08/28/2012   Tubular adenoma   Dense breast    Depression    Genital warts    GERD (gastroesophageal reflux disease)    Heart murmur    Hemorrhoids    Hx of cardiac catheterization 2009   normal coronary arteries   Hx of menorrhagia    Novasure   Hypokalemia    Hypomagnesemia    Hypophosphatemia    Lactose intolerance in adult    Meniscus degeneration, right    torn r knee/ 10/2019   Mitral valve prolapse    Normal cardiac stress test 03/09/2012   no ischemia   Positive PPD    Rapid heart beat    States "irreg heart beat"   Sleep apnea    Thyroid disease    Thyroid nodules    Past Surgical History:  Procedure Laterality Date   CARDIAC CATHETERIZATION     x2 with normal results per pt   KNEE ARTHROSCOPY W/ MENISCECTOMY Right 08/2023   NOVASURE ABLATION     UPPER GASTROINTESTINAL ENDOSCOPY  01/12/2012    Prior to Admission medications   Medication Sig Start Date End Date Taking? Authorizing Provider  Ascorbic Acid (VITAMIN C) 1000 MG tablet Take 2,000 mg by mouth at bedtime.   Yes [provider]  aspirin EC 81 MG EC tablet Take 1 tablet (81 mg  total) by mouth daily. 10/21/19  Yes Orpah Cobb, MD  Calcium Carb-Cholecalciferol (OYSTER SHELL CALCIUM 250+D PO) Take 1 tablet by mouth at bedtime.   Yes [provider]  Cholecalciferol (VITAMIN D) 50 MCG (2000 UT) tablet Take 3,000 Units by mouth at bedtime.   Yes [provider]  KLOR-CON M20 20 MEQ tablet TAKE 2 TABLETS (40 MEQ TOTAL) BY MOUTH DAILY. TAKE 2 TABLETS BY MOUTH EVERY DAY 03/02/23  Yes Sagardia, Eilleen Kempf, MD  losartan (COZAAR) 50 MG tablet Take 25 mg by mouth 2 (two) times daily.   Yes [provider]  metoprolol succinate (TOPROL-XL) 25 MG 24 hr tablet TAKE 1 TABLET (25 MG) BY MOUTH IN THE MORNING AND AT BEDTIME 09/09/23  Yes Meng, Wynema Birch, PA  pantoprazole (PROTONIX) 40 MG tablet Take 1 tablet (40 mg total) by mouth daily. Patient taking differently: Take 40 mg by mouth as needed. 11/08/22  Yes Georgina Quint, MD  PARoxetine (PAXIL-CR) 25 MG 24 hr tablet Take 25 mg by mouth daily.   Yes [provider]  Polyethyl Glycol-Propyl Glycol (SYSTANE OP) Place 1 drop into both eyes as needed.   Yes [provider]  TURMERIC PO Take 1 capsule by mouth daily as needed (pain/inflammation).   Yes [provider]  celecoxib (CELEBREX) 200 MG capsule Take 200 mg by mouth 2 (two) times daily. Patient not taking: Reported on 11/09/2023 08/25/23   [provider]  HYDROcodone-acetaminophen (NORCO) 10-325 MG tablet Take 1 tablet by mouth 4 (four) times daily as needed. Patient not taking: Reported on 11/09/2023 08/25/23   [provider]  oxyCODONE (ROXICODONE) 5 MG immediate release tablet Take 1 tablet (5 mg total) by mouth every 4 (four) hours as needed for severe pain (pain score 7-10). Patient not taking: Reported on 11/09/2023 08/26/23   Marita Kansas, PA-C    Current Outpatient Medications  Medication Sig Dispense Refill   Ascorbic Acid (VITAMIN C) 1000 MG tablet Take 2,000 mg by mouth at bedtime.     aspirin EC 81  MG EC tablet Take 1 tablet (81 mg total) by mouth daily.     Calcium Carb-Cholecalciferol (OYSTER SHELL CALCIUM 250+D PO) Take 1 tablet by mouth at bedtime.     Cholecalciferol (VITAMIN D) 50 MCG (2000 UT) tablet Take 3,000 Units by mouth at bedtime.     KLOR-CON M20 20 MEQ tablet TAKE 2 TABLETS (40 MEQ TOTAL) BY MOUTH DAILY. TAKE 2 TABLETS BY MOUTH EVERY DAY 60 tablet 23   losartan (COZAAR) 50 MG tablet Take 25 mg by mouth 2 (two) times daily.     metoprolol succinate (TOPROL-XL) 25 MG 24 hr tablet TAKE 1 TABLET (25 MG) BY MOUTH IN THE MORNING AND AT BEDTIME 60 tablet 11   pantoprazole (PROTONIX) 40 MG tablet Take 1 tablet (40 mg total) by mouth daily. (Patient taking differently: Take 40 mg by mouth as needed.) 30 tablet 3   PARoxetine (PAXIL-CR) 25 MG 24 hr tablet Take 25 mg by mouth daily.     Polyethyl Glycol-Propyl Glycol (SYSTANE OP) Place 1 drop into both eyes as needed.     TURMERIC PO Take 1 capsule by mouth daily as needed (pain/inflammation).     celecoxib (CELEBREX) 200 MG capsule Take 200 mg by mouth 2 (two) times daily. (Patient not taking: Reported on 11/09/2023)     HYDROcodone-acetaminophen (NORCO) 10-325 MG tablet Take 1 tablet by mouth 4 (four) times daily as needed. (Patient not taking: Reported on 11/09/2023)     oxyCODONE (ROXICODONE) 5 MG immediate release tablet Take 1 tablet (5 mg total) by mouth every 4 (four) hours as needed for severe pain (pain score 7-10). (Patient not taking: Reported on 11/09/2023) 10 tablet 0   Current Facility-Administered Medications  Medication Dose Route Frequency Provider Last Rate Last Admin   0.9 %  sodium chloride infusion  500 mL Intravenous Continuous Kayzen Kendzierski, Carie Caddy, MD        Allergies as of 11/09/2023 - Review Complete 11/09/2023  Allergen Reaction Noted   Doxycycline Rash     Family History  Problem Relation Age of Onset   Heart disease Father        died Mi 89   Diabetes Maternal Grandmother    Hypertension Maternal Grandfather     Diabetes Maternal Grandfather    Heart disease Maternal Grandfather    Colon cancer Maternal Grandfather    Rectal cancer Maternal Grandfather    Prostate cancer Neg Hx    Breast cancer Neg Hx    Colon polyps Neg Hx    Esophageal cancer Neg Hx     Social History   Socioeconomic History   Marital status: Divorced    Spouse name: Not on file   Number of children: 2   Years of education:  16   Highest education level: Not on file  Occupational History   Occupation: dialysis tech    Employer: [employer]  Tobacco Use   Smoking status: Never   Smokeless tobacco: Never  Vaping Use   Vaping status: Never Used  Substance and Sexual Activity   Alcohol use: No   Drug use: No   Sexual activity: Yes    Birth control/protection: Post-menopausal  Other Topics Concern   Not on file  Social History Narrative   Ms. Messman is a divorced [place] American female who works as a Teacher, early years/pre who has a number of specialists but no primary care physician. She lived in [place] and in [place] for a number of years her mom is from [place]. 16 years of education went to college at [employer] lives at home with her son who is in his 35s no pets   Neg ets tob etoh hx PA   6 hours of sleep   G2P2    TD2010  colonoscopy 2009   Now running  own business    HH of 2    Left handed    Caffeine use: tea sometimes         Social Drivers of Corporate investment banker Strain: Not on file  Food Insecurity: Not on file  Transportation Needs: Not on file  Physical Activity: Not on file  Stress: Not on file  Social Connections: Not on file  Intimate Partner Violence: Not on file    Physical Exam: Vital signs in last 24 hours: @BP  107/60   Pulse (!) 56   Temp 98 F (36.7 C) (Temporal)   Ht 5\' 6"  (1.676 m)   Wt 190 lb (86.2 kg)   LMP 09/17/2016 (Approximate)   SpO2 98%   BMI 30.67 kg/m  GEN: NAD EYE: Sclerae anicteric ENT: MMM CV:  Non-tachycardic Pulm: CTA b/l GI: Soft, NT/ND NEURO:  Alert & Oriented x 3   Erick Blinks, MD [facility]  11/09/2023 10:10 AM

## 2023-11-09 NOTE — Patient Instructions (Addendum)
 Post-dilation diet and then advance diet as tolerated. Continue present medications. Pantoprazole 40 mg daily is recommended.  Take 30 minutes before first meal of the day If swallowing trouble persists please contact my office for additional recommendations.                           Follow dilation diet!!!! See handout!  YOU HAD AN ENDOSCOPIC PROCEDURE TODAY AT THE [facility]:   Refer to the procedure report that was given to you for any specific questions about what was found during the examination.  If the procedure report does not answer your questions, please call your gastroenterologist to clarify.  If you requested that your care partner not be given the details of your procedure findings, then the procedure report has been included in a sealed envelope for you to review at your convenience later.  YOU SHOULD EXPECT: Some feelings of bloating in the abdomen. Passage of more gas than usual.  Walking can help get rid of the air that was put into your GI tract during the procedure and reduce the bloating.  Please Note:  You might notice some irritation and congestion in your nose or some drainage.  This is from the oxygen used during your procedure.  There is no need for concern and it should clear up in a day or so.  SYMPTOMS TO REPORT IMMEDIATELY:  Following upper endoscopy (EGD)  Vomiting of blood or coffee ground material  New chest pain or pain under the shoulder blades  Painful or persistently difficult swallowing  New shortness of breath  Fever of 100F or higher  Black, tarry-looking stools  For urgent or emergent issues, a gastroenterologist can be reached at any hour by calling [phone]. Do not use MyChart messaging for urgent concerns.    DIET:  We do recommend a small meal at first, but then you may proceed to your regular diet.  Drink plenty of fluids but you should avoid alcoholic beverages for 24 hours.  ACTIVITY:  You should plan to take it easy for  the rest of today and you should NOT DRIVE or use heavy machinery until tomorrow (because of the sedation medicines used during the test).    FOLLOW UP: Our staff will call the number listed on your records the next business day following your procedure.  We will call around 7:15- 8:00 am to check on you and address any questions or concerns that you may have regarding the information given to you following your procedure. If we do not reach you, we will leave a message.      SIGNATURES/CONFIDENTIALITY: You and/or your care partner have signed paperwork which will be entered into your electronic medical record.  These signatures attest to the fact that that the information above on your After Visit Summary has been reviewed and is understood.  Full responsibility of the confidentiality of this discharge information lies with you and/or your care-partner.

## 2023-11-09 NOTE — Progress Notes (Signed)
 Called to room to assist during endoscopic procedure.  Patient ID and intended procedure confirmed with present staff. Received instructions for my participation in the procedure from the performing physician.

## 2023-11-09 NOTE — Progress Notes (Signed)
 To pacu, VSS. Report to [email or web address]

## 2023-11-10 ENCOUNTER — Telehealth: Payer: Self-pay

## 2023-11-10 NOTE — Telephone Encounter (Signed)
 Attempted f/u call. No answer, left VM.

## 2023-11-21 ENCOUNTER — Encounter: Payer: 59 | Attending: Emergency Medicine | Admitting: Skilled Nursing Facility1

## 2023-11-21 ENCOUNTER — Encounter: Payer: Self-pay | Admitting: Skilled Nursing Facility1

## 2023-11-21 VITALS — Ht 66.0 in | Wt 188.6 lb

## 2023-11-21 DIAGNOSIS — R7303 Prediabetes: Secondary | ICD-10-CM | POA: Diagnosis present

## 2023-11-21 NOTE — Progress Notes (Signed)
 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 Pt states she does check her blood sugars sometimes randomly.  Pt states she is not as active due to her knee.  Pt states she is disappointed she still has not lost the amount of weight she desires.   Pt states she should not eat wheat because she heard wheat cuts the intestine and rips it open: Dietitian educated pt on whole wheat products and the benefits they offer to the gut.    Body Composition Scale Date 10/10/2023 11/21/2023  Current Body Weight 190 188.3 188.6  Total Body Fat % 39.1 38.8 38.9  Visceral Fat 11  11  Fat-Free Mass % 60.8  61   Total Body Water % 44.9 45 45  Muscle-Mass lbs 29.9  29.9  BMI 30.5  30.3  Body Fat Displacement            Torso  lbs 46  45.4         Left Leg  lbs 9.2  9         Right Leg  lbs 9.2  9         Left Arm  lbs 4.6  4.5         Right Arm   lbs 4.6  4.5     Estimated daily fluid intake: 32+ oz Supplements: vitamin C, D, calcium, tumeric, sea moss Sleep: pt states not great due to neck pain; reporting about 6-7 hours Stress / self-care: states her mother has dementia whom is in a facility  Current average weekly physical activity: ADL 24-Hr Dietary Recall: in bed around 11:30-12am First Meal: skipped Snack:  Second Meal: skipped  Snack 4pm: baked chicken + yams or sweet potato souffle or fried okra or green benas Third Meal: soup or salad Snack 9pm:  sandwich or chips Beverages: water, grape juice, tea, sprite   Estimated Energy Needs Calories: 1500-1800   NUTRITION DIAGNOSIS  NB-1.1 Food and nutrition-related knowledge deficit As related to newly diagnosed prediabetes.  As evidenced by pt referral.   NUTRITION INTERVENTION  Nutrition education (E-1) on the following topics: Creation of balanced and diverse meals to increase the intake of nutrient-rich foods that provide essential vitamins, minerals, fiber, and phytonutrients  Variety of Fruits and Vegetables:  Aim for a colorful array of fruits and vegetables to ensure a wide range of nutrients. Include a mix of leafy greens, berries, citrus fruits, cruciferous vegetables, and more. Whole Grains: Choose whole grains over refined grains. Examples include brown rice, quinoa, oats, whole wheat, and barley. Lean Proteins: Include lean sources of protein, such as poultry, fish, tofu, legumes, beans, lentils, and low-fat dairy products. Limit red and processed meats. Healthy Fats: Incorporate sources of healthy fats, including avocados, nuts, seeds, and olive oil. Limit saturated and trans fats found in fried and processed foods. Dairy or Dairy Alternatives: Choose low-fat or fat-free dairy products, or plant-based alternatives like almond or soy milk. Portion Control: Be mindful of portion sizes to avoid overeating.  Pay attention to hunger and satisfaction cues. Limit Added Sugars: Minimize the consumption of sugary beverages, snacks, and desserts. Check food labels for added sugars and opt for natural sources of sweetness such as whole fruits. Hydration: Drink plenty of water throughout the day. Limit sugary drinks and excessive caffeine intake. Moderate Sodium Intake: Reduce the consumption of high-sodium foods. Use herbs and spices for flavor instead of excessive salt. Meal Planning and Preparation: Plan and prepare meals ahead of time to make healthier choices more  convenient. Include a mix of food groups in each meal. Limit Processed Foods: Minimize the intake of highly processed and packaged foods that are often high in added sugars, salt, and unhealthy fats. Regular Physical Activity: Combine a healthy diet with regular physical activity for overall well-being. Aim for at least 150 minutes of moderate-intensity aerobic exercise per week, along with strength training. Moderation and Balance: Enjoy treats and indulgent foods in moderation, emphasizing balance rather than strict restriction.  Handouts Previously Provided Include  Snack ideas list Body comp print out  Detailed MyPlate  Learning Style & Readiness for Change Teaching method utilized: Visual & Auditory  Demonstrated degree of understanding via: Teach Back  Barriers to learning/adherence to lifestyle change: reported stress level due to her difficulty in saying no and being pulled in too many directions   Goals Established by Pt I am going to work on saying no: pt states that has been tougher to accomplish  Continue: I am going to work on choosing ONLY 1 project per day not starting the next one until the first is completed: pt states she is proud of this one and feels accomplished  I am going to follow the structured eating plan of eating every 3-5 hours starting within 1-1.5 hours of waking: Pt states she forgot about this one   Continue: I am going to do some journaling to help clear my mind: pt states she did not get to this one: revamped to specific journaling at night and recap things learned that day    MONITORING & EVALUATION Dietary intake, weekly physical activity  Next Steps  Patient is to follow up as needed

## 2023-11-28 DIAGNOSIS — F411 Generalized anxiety disorder: Secondary | ICD-10-CM | POA: Diagnosis not present

## 2023-11-28 DIAGNOSIS — F331 Major depressive disorder, recurrent, moderate: Secondary | ICD-10-CM | POA: Diagnosis not present

## 2023-11-28 DIAGNOSIS — F4310 Post-traumatic stress disorder, unspecified: Secondary | ICD-10-CM | POA: Diagnosis not present

## 2023-12-05 ENCOUNTER — Ambulatory Visit: Payer: 59 | Admitting: Cardiovascular Disease

## 2023-12-05 DIAGNOSIS — F411 Generalized anxiety disorder: Secondary | ICD-10-CM | POA: Diagnosis not present

## 2023-12-05 DIAGNOSIS — F4310 Post-traumatic stress disorder, unspecified: Secondary | ICD-10-CM | POA: Diagnosis not present

## 2023-12-05 DIAGNOSIS — F331 Major depressive disorder, recurrent, moderate: Secondary | ICD-10-CM | POA: Diagnosis not present

## 2023-12-12 DIAGNOSIS — Z13220 Encounter for screening for lipoid disorders: Secondary | ICD-10-CM | POA: Diagnosis not present

## 2023-12-12 DIAGNOSIS — Z130 Encounter for screening for diseases of the blood and blood-forming organs and certain disorders involving the immune mechanism: Secondary | ICD-10-CM | POA: Diagnosis not present

## 2023-12-12 DIAGNOSIS — Z13228 Encounter for screening for other metabolic disorders: Secondary | ICD-10-CM | POA: Diagnosis not present

## 2023-12-12 DIAGNOSIS — Z1389 Encounter for screening for other disorder: Secondary | ICD-10-CM | POA: Diagnosis not present

## 2023-12-12 DIAGNOSIS — N909 Noninflammatory disorder of vulva and perineum, unspecified: Secondary | ICD-10-CM | POA: Diagnosis not present

## 2023-12-12 DIAGNOSIS — Z01419 Encounter for gynecological examination (general) (routine) without abnormal findings: Secondary | ICD-10-CM | POA: Diagnosis not present

## 2023-12-12 DIAGNOSIS — E559 Vitamin D deficiency, unspecified: Secondary | ICD-10-CM | POA: Diagnosis not present

## 2023-12-12 DIAGNOSIS — E2839 Other primary ovarian failure: Secondary | ICD-10-CM | POA: Diagnosis not present

## 2023-12-13 ENCOUNTER — Ambulatory Visit: Payer: Self-pay

## 2023-12-13 ENCOUNTER — Other Ambulatory Visit (HOSPITAL_BASED_OUTPATIENT_CLINIC_OR_DEPARTMENT_OTHER): Payer: Self-pay | Admitting: Obstetrics and Gynecology

## 2023-12-13 DIAGNOSIS — E2839 Other primary ovarian failure: Secondary | ICD-10-CM

## 2023-12-13 NOTE — Telephone Encounter (Signed)
 Chief Complaint: Flank pain Symptoms: right sided flank pain, cramping and sharp pain, 7/10 Frequency: Comes and goes Pertinent Negatives: Patient denies fever, urinary symptoms, vomiting, chest pain  Disposition: [] ED /[] Urgent Care (no appt availability in office) / [x] Appointment(In office/virtual)/ []  [facility]/ [] Home Care/ [] Refused Recommended Disposition /[] [facility]/ []  Follow-up with PCP Additional Notes: Patient states she has been experiencing right sided flank pain unsure of the cause. Patient denies urinary symptoms. Patient states she does not like to take OTC medication. Care advice was given and patient has been scheduled for an appointment with PCP tomorrow afternoon.  Copied from CRM [id number]. Topic: Clinical - Red Word Triage >> Dec 13, 2023  1:09 PM Truddie Crumble wrote: Red Word that prompted transfer to Nurse Triage: sharp pain and cramping on right side for two weeks Reason for Disposition  MODERATE pain (e.g., interferes with normal activities or awakens from sleep)  Answer Assessment - Initial Assessment Questions 1. LOCATION: "Where does it hurt?" (e.g., left, right)     Right side 2. ONSET: "When did the pain start?"     2 weeks ago  3. SEVERITY: "How bad is the pain?" (e.g., Scale 1-10; mild, moderate, or severe)   - MILD (1-3): doesn't interfere with normal activities    - MODERATE (4-7): interferes with normal activities or awakens from sleep    - SEVERE (8-10): excruciating pain and patient unable to do normal activities (stays in bed)       7/10 4. PATTERN: "Does the pain come and go, or is it constant?"      Come and go 5. CAUSE: "What do you think is causing the pain?"     I have no idea  6. OTHER SYMPTOMS:  "Do you have any other symptoms?" (e.g., fever, abdomen pain, vomiting, leg weakness, burning with urination, blood in urine)     No  Protocols used: Flank Pain-A-AH

## 2023-12-14 ENCOUNTER — Ambulatory Visit: Admitting: Emergency Medicine

## 2023-12-15 ENCOUNTER — Encounter: Payer: Self-pay | Admitting: Emergency Medicine

## 2023-12-15 ENCOUNTER — Ambulatory Visit (INDEPENDENT_AMBULATORY_CARE_PROVIDER_SITE_OTHER): Admitting: Emergency Medicine

## 2023-12-15 VITALS — BP 108/70 | HR 74 | Temp 97.7°F | Ht 66.0 in | Wt 192.0 lb

## 2023-12-15 DIAGNOSIS — R1011 Right upper quadrant pain: Secondary | ICD-10-CM | POA: Diagnosis not present

## 2023-12-15 DIAGNOSIS — I10 Essential (primary) hypertension: Secondary | ICD-10-CM

## 2023-12-15 DIAGNOSIS — R7303 Prediabetes: Secondary | ICD-10-CM

## 2023-12-15 NOTE — Progress Notes (Signed)
 [name] 63 y.o.   Chief Complaint  Patient presents with   Flank Pain    Patient states the right flank pain has been going on for about 2 weeks. She states it is sharp intermittently she also has noticed she has been having a lot more gas than usual. She is taking tumeric to help. No bloating, she is using the bathroom fine.     HISTORY OF PRESENT ILLNESS: This is a 63 y.o. female complaining of right upper abdominal pain for the past 2 weeks No associated symptoms. Was recently seen by her gynecologist.  Blood work including CBC, hemoglobin A1c, CMP was within normal limits a couple days ago.  Flank Pain Pertinent negatives include no abdominal pain, chest pain, fever or headaches.     Prior to Admission medications   Medication Sig Start Date End Date Taking? Authorizing Provider  Ascorbic Acid (VITAMIN C) 1000 MG tablet Take 2,000 mg by mouth at bedtime.   Yes [provider]  aspirin EC 81 MG EC tablet Take 1 tablet (81 mg total) by mouth daily. 10/21/19  Yes Orpah Cobb, MD  Calcium Carb-Cholecalciferol (OYSTER SHELL CALCIUM 250+D PO) Take 1 tablet by mouth at bedtime.   Yes [provider]  Cholecalciferol (VITAMIN D) 50 MCG (2000 UT) tablet Take 3,000 Units by mouth at bedtime.   Yes [provider]  KLOR-CON M20 20 MEQ tablet TAKE 2 TABLETS (40 MEQ TOTAL) BY MOUTH DAILY. TAKE 2 TABLETS BY MOUTH EVERY DAY 03/02/23  Yes Roth Ress, Eilleen Kempf, MD  losartan (COZAAR) 50 MG tablet Take 25 mg by mouth 2 (two) times daily.   Yes [provider]  metoprolol succinate (TOPROL-XL) 25 MG 24 hr tablet TAKE 1 TABLET (25 MG) BY MOUTH IN THE MORNING AND AT BEDTIME 09/09/23  Yes Meng, Wynema Birch, PA  pantoprazole (PROTONIX) 40 MG tablet Take 1 tablet (40 mg total) by mouth daily. 11/09/23  Yes Pyrtle, Carie Caddy, MD  PARoxetine (PAXIL-CR) 25 MG 24 hr tablet Take 25 mg by mouth daily.   Yes [provider]  Polyethyl Glycol-Propyl Glycol (SYSTANE  OP) Place 1 drop into both eyes as needed.   Yes [provider]  TURMERIC PO Take 1 capsule by mouth daily as needed (pain/inflammation).   Yes [provider]  celecoxib (CELEBREX) 200 MG capsule Take 200 mg by mouth 2 (two) times daily. Patient not taking: Reported on 09/19/2023 08/25/23   [provider]  HYDROcodone-acetaminophen (NORCO) 10-325 MG tablet Take 1 tablet by mouth 4 (four) times daily as needed. Patient not taking: Reported on 09/19/2023 08/25/23   [provider]  oxyCODONE (ROXICODONE) 5 MG immediate release tablet Take 1 tablet (5 mg total) by mouth every 4 (four) hours as needed for severe pain (pain score 7-10). Patient not taking: Reported on 09/19/2023 08/26/23   Marita Kansas, PA-C    Allergies  Allergen Reactions   Doxycycline Rash    Patient Active Problem List   Diagnosis Date Noted   Mixed conductive and sensorineural hearing loss of right ear with restricted hearing of left ear 08/15/2023   Tinnitus of both ears 08/15/2023   Pain and swelling of left lower leg 05/05/2023   Right knee meniscal tear 05/05/2023   Elevated troponin 02/27/2023   Hypokalemia 02/27/2023   Primary hypertension 02/27/2023   Postprandial abdominal bloating 08/26/2022   Left lower quadrant abdominal tenderness without rebound tenderness 08/26/2022   Diarrhea 08/26/2022   Vomiting without nausea 08/26/2022  Family history of bladder cancer 07/05/2022   Intermittent gross hematuria 07/05/2022   Recent urinary tract infection 07/05/2022   Weight loss 12/07/2021   Fatigue 12/07/2021   Post viral syndrome 12/07/2021   Chronic sinusitis 08/25/2021   Prediabetes 08/25/2021   Paroxysmal SVT (supraventricular tachycardia) (HCC) 03/31/2021   Degenerative disc disease, cervical 10/13/2020   Stress incontinence of urine 10/31/2018   History of diverticulosis 05/04/2017   Malaise 07/05/2013   Palpitations 07/05/2013   History of PSVT (paroxysmal  supraventricular tachycardia) 06/24/2012   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   Multinodular goiter 05/12/2012   Sleep apnea 01/19/2008   IRRITABLE BOWEL SYNDROME 01/18/2008    Past Medical History:  Diagnosis Date   Anemia    Anxiety    Arrhythmia 2013   Colon polyp 08/28/2012   Tubular adenoma   Dense breast    Depression    Genital warts    GERD (gastroesophageal reflux disease)    Heart murmur    Hemorrhoids    Hx of cardiac catheterization 2009   normal coronary arteries   Hx of menorrhagia    Novasure   Hypokalemia    Hypomagnesemia    Hypophosphatemia    Lactose intolerance in adult    Meniscus degeneration, right    torn r knee/ 10/2019   Mitral valve prolapse    Normal cardiac stress test 03/09/2012   no ischemia   Positive PPD    Rapid heart beat    States "irreg heart beat"   Sleep apnea    Thyroid disease    Thyroid nodules    Past Surgical History:  Procedure Laterality Date   CARDIAC CATHETERIZATION     x2 with normal results per pt   KNEE ARTHROSCOPY W/ MENISCECTOMY Right 08/2023   NOVASURE ABLATION     UPPER GASTROINTESTINAL ENDOSCOPY  01/12/2012    Social History   Socioeconomic History   Marital status: Divorced    Spouse name: Not on file   Number of children: 2   Years of education: 16   Highest education level: Not on file  Occupational History   Occupation: dialysis Theme park manager: [employer]  Tobacco Use   Smoking status: Never   Smokeless tobacco: Never  Vaping Use   Vaping status: Never Used  Substance and Sexual Activity   Alcohol use: No   Drug use: No   Sexual activity: Yes    Birth control/protection: Post-menopausal  Other Topics Concern   Not on file  Social History Narrative   Ms. 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 She lived in [place] and in [place] for a number of years her  mom is from [place]. 16 years of education went to college at [employer]: [place] lives at home with her son who is in his 60s no pets   Neg ets tob etoh hx PA   6 hours of sleep   G2P2    TD2010  colonoscopy 2009   Now running  own business    HH of 2    Left handed    Caffeine use: tea sometimes         Social Drivers of Corporate investment banker Strain: Not on file  Food Insecurity: Not on file  Transportation Needs: Not on file  Physical Activity: Not on file  Stress: Not on file  Social Connections: Not on  file  Intimate Partner Violence: Not on file    Family History  Problem Relation Age of Onset   Heart disease Father        died Mi 62   Diabetes Maternal Grandmother    Hypertension Maternal Grandfather    Diabetes Maternal Grandfather    Heart disease Maternal Grandfather    Colon cancer Maternal Grandfather    Rectal cancer Maternal Grandfather    Prostate cancer Neg Hx    Breast cancer Neg Hx    Colon polyps Neg Hx    Esophageal cancer Neg Hx      Review of Systems  Constitutional: Negative.  Negative for chills and fever.  HENT: Negative.  Negative for congestion and sore throat.   Respiratory: Negative.  Negative for cough and shortness of breath.   Cardiovascular: Negative.  Negative for chest pain and palpitations.  Gastrointestinal:  Negative for abdominal pain, diarrhea, nausea and vomiting.  Genitourinary:  Positive for flank pain.  Skin: Negative.  Negative for rash.  Neurological: Negative.  Negative for dizziness and headaches.  All other systems reviewed and are negative.   Vitals:   12/15/23 1418  BP: 108/70  Pulse: 74  Temp: 97.7 F (36.5 C)  SpO2: 99%    Physical Exam Vitals reviewed.  Constitutional:      Appearance: Normal appearance.  HENT:     Head: Normocephalic.     Mouth/Throat:     Mouth: Mucous membranes are moist.     Pharynx: Oropharynx is clear.  Eyes:     Extraocular Movements: Extraocular movements intact.      Conjunctiva/sclera: Conjunctivae normal.     Pupils: Pupils are equal, round, and reactive to light.  Cardiovascular:     Rate and Rhythm: Normal rate and regular rhythm.     Pulses: Normal pulses.     Heart sounds: Normal heart sounds.  Pulmonary:     Effort: Pulmonary effort is normal.     Breath sounds: Normal breath sounds.  Abdominal:     Palpations: Abdomen is soft.     Tenderness: There is abdominal tenderness (Mild right upper quadrant tenderness).  Musculoskeletal:     Cervical back: No tenderness.  Lymphadenopathy:     Cervical: No cervical adenopathy.  Skin:    General: Skin is warm and dry.     Capillary Refill: Capillary refill takes less than 2 seconds.  Neurological:     General: No focal deficit present.     Mental Status: She is alert and oriented to person, place, and time.  Psychiatric:        Mood and Affect: Mood normal.        Behavior: Behavior normal.      ASSESSMENT & PLAN: A total of 43 minutes was spent with the patient and counseling/coordination of care regarding preparing for this visit, review of most recent office visit notes, review of multiple chronic medical conditions and their management, review of all medications, differential diagnosis of right upper abdominal pain and need for gallbladder ultrasound, review of most recent bloodwork results, review of health maintenance items, education on nutrition, prognosis, documentation, and need for follow up.   Problem List Items Addressed This Visit       Cardiovascular and Mediastinum   Primary hypertension   BP Readings from Last 3 Encounters:  12/15/23 108/70  11/09/23 94/61  09/19/23 100/70  Well-controlled hypertension Continue metoprolol succinate 25 mg daily along with losartan 50 mg daily Cardiovascular risks associated with hypertension  discussed Diet and nutrition discussed         Other   Prediabetes   Clinically stable. Diet and nutrition discussed      Right upper  quadrant abdominal pain - Primary   Clinically stable.  No red flag signs or symptoms. Recent blood work shows normal white cell count and normal liver function tests. Differential diagnosis discussed including possibility of gallstones Pain management discussed Recommend gallbladder ultrasound ED precautions given Advised to contact the office if no better or worse during the next several days      Relevant Orders   Urinalysis   US Abdomen Limited RUQ (LIVER/GB)   Patient Instructions  Abdominal Pain, Adult  Many things can cause belly (abdominal) pain. In most cases, belly pain is not a serious problem and can be watched and treated at home. But in some cases, it can be serious. Your doctor will try to find the cause of your belly pain. Follow these instructions at home: Medicines Take over-the-counter and prescription medicines only as told by your doctor. Do not take medicines that help you poop (laxatives) unless told by your doctor. General instructions Watch your belly pain for any changes. Tell your doctor if the pain gets worse. Drink enough fluid to keep your pee (urine) pale yellow. Contact a doctor if: Your belly pain changes or gets worse. You have very bad cramping or bloating in your belly. You vomit. Your pain gets worse with meals, after eating, or with certain foods. You have trouble pooping or have watery poop for more than 2-3 days. You are not hungry, or you lose weight without trying. You have signs of not getting enough fluid or water (dehydration). These may include: Dark pee, very little pee, or no pee. Cracked lips or dry mouth. Feeling sleepy or weak. You have pain when you pee or poop. Your belly pain wakes you up at night. You have blood in your pee. You have a fever. Get help right away if: You cannot stop vomiting. Your pain is only in one part of your belly, like on the right side. You have bloody or black poop, or poop that looks like  tar. You have trouble breathing. You have chest pain. These symptoms may be an emergency. Get help right away. Call 911. Do not wait to see if the symptoms will go away. Do not drive yourself to the hospital. This information is not intended to replace advice given to you by your health care provider. Make sure you discuss any questions you have with your health care provider. Document Revised: 06/16/2022 Document Reviewed: 06/16/2022 Elsevier Patient Education  2024 Elsevier Inc.    Edwina Barth, MD [facility]

## 2023-12-15 NOTE — Assessment & Plan Note (Signed)
 BP Readings from Last 3 Encounters:  12/15/23 108/70  11/09/23 94/61  09/19/23 100/70  Well-controlled hypertension Continue metoprolol succinate 25 mg daily along with losartan 50 mg daily Cardiovascular risks associated with hypertension discussed Diet and nutrition discussed

## 2023-12-15 NOTE — Assessment & Plan Note (Signed)
Clinically stable. Diet and nutrition discussed.

## 2023-12-15 NOTE — Assessment & Plan Note (Signed)
 Clinically stable.  No red flag signs or symptoms. Recent blood work shows normal white cell count and normal liver function tests. Differential diagnosis discussed including possibility of gallstones Pain management discussed Recommend gallbladder ultrasound ED precautions given Advised to contact the office if no better or worse during the next several days

## 2023-12-15 NOTE — Patient Instructions (Signed)
 Abdominal Pain, Adult    Many things can cause belly (abdominal) pain. In most cases, belly pain is not a serious problem and can be watched and treated at home. But in some cases, it can be serious.  Your doctor will try to find the cause of your belly pain.  Follow these instructions at home:  Medicines  Take over-the-counter and prescription medicines only as told by your doctor.  Do not take medicines that help you poop (laxatives) unless told by your doctor.  General instructions  Watch your belly pain for any changes. Tell your doctor if the pain gets worse.  Drink enough fluid to keep your pee (urine) pale yellow.  Contact a doctor if:  Your belly pain changes or gets worse.  You have very bad cramping or bloating in your belly.  You vomit.  Your pain gets worse with meals, after eating, or with certain foods.  You have trouble pooping or have watery poop for more than 2-3 days.  You are not hungry, or you lose weight without trying.  You have signs of not getting enough fluid or water (dehydration). These may include:  Dark pee, very Chastain pee, or no pee.  Cracked lips or dry mouth.  Feeling sleepy or weak.  You have pain when you pee or poop.  Your belly pain wakes you up at night.  You have blood in your pee.  You have a fever.  Get help right away if:  You cannot stop vomiting.  Your pain is only in one part of your belly, like on the right side.  You have bloody or black poop, or poop that looks like tar.  You have trouble breathing.  You have chest pain.  These symptoms may be an emergency. Get help right away. Call 911.  Do not wait to see if the symptoms will go away.  Do not drive yourself to the hospital.  This information is not intended to replace advice given to you by your health care provider. Make sure you discuss any questions you have with your health care provider.  Document Revised: 06/16/2022 Document Reviewed: 06/16/2022  Elsevier Patient Education  2024 [employer].

## 2023-12-16 LAB — URINALYSIS, ROUTINE W REFLEX MICROSCOPIC
Bilirubin Urine: NEGATIVE
Hgb urine dipstick: NEGATIVE
Ketones, ur: NEGATIVE
Nitrite: NEGATIVE
Specific Gravity, Urine: >=1.03 — AB (ref 1.000–1.030)
Total Protein, Urine: NEGATIVE
Urine Glucose: NEGATIVE
Urobilinogen, UA: 0.2 (ref 0.0–1.0)
pH: 5.5 (ref 5.0–8.0)

## 2023-12-19 ENCOUNTER — Encounter: Payer: Self-pay | Admitting: Emergency Medicine

## 2023-12-19 ENCOUNTER — Ambulatory Visit
Admission: RE | Admit: 2023-12-19 | Discharge: 2023-12-19 | Disposition: A | Discharge status: Home / Self Care | Source: Ambulatory Visit | Attending: Emergency Medicine | Admitting: Emergency Medicine

## 2023-12-19 DIAGNOSIS — R1011 Right upper quadrant pain: Secondary | ICD-10-CM

## 2023-12-20 ENCOUNTER — Ambulatory Visit: Attending: Cardiovascular Disease | Admitting: Cardiovascular Disease

## 2024-01-03 DIAGNOSIS — N9089 Other specified noninflammatory disorders of vulva and perineum: Secondary | ICD-10-CM | POA: Diagnosis not present

## 2024-01-09 ENCOUNTER — Ambulatory Visit (HOSPITAL_BASED_OUTPATIENT_CLINIC_OR_DEPARTMENT_OTHER)
Admission: RE | Admit: 2024-01-09 | Discharge: 2024-01-09 | Disposition: A | Discharge status: Home / Self Care | Source: Ambulatory Visit | Attending: Obstetrics and Gynecology | Admitting: Obstetrics and Gynecology

## 2024-01-09 DIAGNOSIS — E2839 Other primary ovarian failure: Secondary | ICD-10-CM | POA: Insufficient documentation

## 2024-01-09 DIAGNOSIS — F411 Generalized anxiety disorder: Secondary | ICD-10-CM | POA: Diagnosis not present

## 2024-01-27 ENCOUNTER — Encounter: Payer: Self-pay | Admitting: Family Medicine

## 2024-01-27 ENCOUNTER — Ambulatory Visit: Payer: Self-pay | Admitting: Family Medicine

## 2024-01-27 ENCOUNTER — Ambulatory Visit (INDEPENDENT_AMBULATORY_CARE_PROVIDER_SITE_OTHER)

## 2024-01-27 ENCOUNTER — Ambulatory Visit: Payer: Self-pay

## 2024-01-27 ENCOUNTER — Ambulatory Visit (INDEPENDENT_AMBULATORY_CARE_PROVIDER_SITE_OTHER): Admitting: Family Medicine

## 2024-01-27 VITALS — BP 104/66 | HR 70 | Temp 97.6°F | Ht 66.0 in | Wt 191.0 lb

## 2024-01-27 DIAGNOSIS — M545 Low back pain, unspecified: Secondary | ICD-10-CM

## 2024-01-27 DIAGNOSIS — R200 Anesthesia of skin: Secondary | ICD-10-CM | POA: Diagnosis not present

## 2024-01-27 DIAGNOSIS — M47816 Spondylosis without myelopathy or radiculopathy, lumbar region: Secondary | ICD-10-CM | POA: Diagnosis not present

## 2024-01-27 DIAGNOSIS — M79672 Pain in left foot: Secondary | ICD-10-CM | POA: Diagnosis not present

## 2024-01-27 DIAGNOSIS — M48061 Spinal stenosis, lumbar region without neurogenic claudication: Secondary | ICD-10-CM | POA: Diagnosis not present

## 2024-01-27 LAB — COMPREHENSIVE METABOLIC PANEL WITH GFR
ALT: 18 U/L (ref 0–35)
AST: 20 U/L (ref 0–37)
Albumin: 4.4 g/dL (ref 3.5–5.2)
Alkaline Phosphatase: 62 U/L (ref 39–117)
BUN: 14 mg/dL (ref 6–23)
CO2: 33 meq/L — ABNORMAL HIGH (ref 19–32)
Calcium: 9 mg/dL (ref 8.4–10.5)
Chloride: 104 meq/L (ref 96–112)
Creatinine, Ser: 0.96 mg/dL (ref 0.40–1.20)
GFR: 62.47 mL/min (ref >60.00)
Glucose, Bld: 83 mg/dL (ref 70–99)
Potassium: 3.9 meq/L (ref 3.5–5.1)
Sodium: 142 meq/L (ref 135–145)
Total Bilirubin: 0.6 mg/dL (ref 0.2–1.2)
Total Protein: 6.8 g/dL (ref 6.0–8.3)

## 2024-01-27 LAB — CBC WITH DIFFERENTIAL/PLATELET
Basophils Absolute: 0.1 10*3/uL (ref 0.0–0.1)
Basophils Relative: 0.9 % (ref 0.0–3.0)
Eosinophils Absolute: 0.1 10*3/uL (ref 0.0–0.7)
Eosinophils Relative: 1.5 % (ref 0.0–5.0)
HCT: 39.4 % (ref 36.0–46.0)
Hemoglobin: 12.4 g/dL (ref 12.0–15.0)
Lymphocytes Relative: 28.2 % (ref 12.0–46.0)
Lymphs Abs: 1.6 10*3/uL (ref 0.7–4.0)
MCHC: 31.5 g/dL (ref 30.0–36.0)
MCV: 74 fl — ABNORMAL LOW (ref 78.0–100.0)
Monocytes Absolute: 0.8 10*3/uL (ref 0.1–1.0)
Monocytes Relative: 13.2 % — ABNORMAL HIGH (ref 3.0–12.0)
Neutro Abs: 3.3 10*3/uL (ref 1.4–7.7)
Neutrophils Relative %: 56.2 % (ref 43.0–77.0)
Platelets: 193 10*3/uL (ref 150.0–400.0)
RBC: 5.32 Mil/uL — ABNORMAL HIGH (ref 3.87–5.11)
RDW: 15.1 % (ref 11.5–15.5)
WBC: 5.8 10*3/uL (ref 4.0–10.5)

## 2024-01-27 NOTE — Progress Notes (Signed)
 Subjective:     Patient ID: [name], female    DOB: March 03, 1961, 63 y.o.   MRN: [id number]  Chief Complaint  Patient presents with   leg numbness    Left lower leg numbess since last saturday    HPI   History of Present Illness         She is here with left anterior lower leg feeling numb x 6 days.  She also reports low back pain when questioned.  Denies injury.  Reports history of DJD.  Denies tingling.  Denies weakness.  No loss of control of bowels or bladder.  No saddle anesthesia. No fever, chills.   She also has left foot pain which is not new and a history of Morton's neuroma.     Health Maintenance Due  Topic Date Due   Zoster Vaccines- Shingrix (1 of 2) Never done   Cervical Cancer Screening (HPV/Pap Cotest)  09/14/2015   COVID-19 Vaccine (3 - 2024-25 season) 05/15/2023    Past Medical History:  Diagnosis Date   Anemia    Anxiety    Arrhythmia 2013   Colon polyp 08/28/2012   Tubular adenoma   Dense breast    Depression    Genital warts    GERD (gastroesophageal reflux disease)    Heart murmur    Hemorrhoids    Hx of cardiac catheterization 2009   normal coronary arteries   Hx of menorrhagia    Novasure   Hypokalemia    Hypomagnesemia    Hypophosphatemia    Lactose intolerance in adult    Meniscus degeneration, right    torn r knee/ 10/2019   Mitral valve prolapse    Normal cardiac stress test 03/09/2012   no ischemia   Positive PPD    Rapid heart beat    States "irreg heart beat"   Sleep apnea    Thyroid  disease    Thyroid  nodules    Past Surgical History:  Procedure Laterality Date   CARDIAC CATHETERIZATION     x2 with normal results per pt   KNEE ARTHROSCOPY W/ MENISCECTOMY Right 08/2023   NOVASURE ABLATION     UPPER GASTROINTESTINAL ENDOSCOPY  01/12/2012    Family History  Problem Relation Age of Onset   Heart disease Father        died Mi 56   Diabetes Maternal Grandmother    Hypertension Maternal Grandfather     Diabetes Maternal Grandfather    Heart disease Maternal Grandfather    Colon cancer Maternal Grandfather    Rectal cancer Maternal Grandfather    Prostate cancer Neg Hx    Breast cancer Neg Hx    Colon polyps Neg Hx    Esophageal cancer Neg Hx     Social History   Socioeconomic History   Marital status: Divorced    Spouse name: Not on file   Number of children: 2   Years of education: 16   Highest education level: Not on file  Occupational History   Occupation: dialysis Theme park manager: [employer]  Tobacco Use   Smoking status: Never   Smokeless tobacco: Never  Vaping Use   Vaping status: Never Used  Substance and Sexual Activity   Alcohol use: No   Drug use: No   Sexual activity: Yes    Birth control/protection: Post-menopausal  Other Topics Concern   Not on file  Social History Narrative   [name] is a divorced [place] American female who 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 She lived in [place]  [place]  and in [place] for a number of years her mom is from [place]. 16 years of education went to college at [employer] lives at home with her son who is in his 43s no pets   Neg ets tob etoh hx PA   6 hours of sleep   G2P2    TD2010  colonoscopy 2009   Now running  own business    HH of 2    Left handed    Caffeine use: tea sometimes         Social Drivers of Corporate investment banker Strain: Not on file  Food Insecurity: Not on file  Transportation Needs: Not on file  Physical Activity: Not on file  Stress: Not on file  Social Connections: Not on file  Intimate Partner Violence: Not on file    Outpatient Medications Prior to Visit  Medication Sig Dispense Refill   Ascorbic Acid (VITAMIN C) 1000 MG tablet Take 2,000 mg by mouth at bedtime.     aspirin  EC 81 MG EC tablet Take 1 tablet (81 mg total) by mouth daily.     Calcium  Carb-Cholecalciferol (OYSTER SHELL CALCIUM  250+D  PO) Take 1 tablet by mouth at bedtime.     Cholecalciferol (VITAMIN D ) 50 MCG (2000 UT) tablet Take 3,000 Units by mouth at bedtime.     HYDROcodone -acetaminophen  (NORCO) 10-325 MG tablet Take 1 tablet by mouth 4 (four) times daily as needed.     KLOR-CON  M20 20 MEQ tablet TAKE 2 TABLETS (40 MEQ TOTAL) BY MOUTH DAILY. TAKE 2 TABLETS BY MOUTH EVERY DAY 60 tablet 23   losartan  (COZAAR ) 50 MG tablet Take 25 mg by mouth 2 (two) times daily.     metoprolol  succinate (TOPROL -XL) 25 MG 24 hr tablet TAKE 1 TABLET (25 MG) BY MOUTH IN THE MORNING AND AT BEDTIME 60 tablet 11   oxyCODONE  (ROXICODONE ) 5 MG immediate release tablet Take 1 tablet (5 mg total) by mouth every 4 (four) hours as needed for severe pain (pain score 7-10). 10 tablet 0   pantoprazole  (PROTONIX ) 40 MG tablet Take 1 tablet (40 mg total) by mouth daily. 30 tablet 3   PARoxetine  (PAXIL -CR) 25 MG 24 hr tablet Take 25 mg by mouth daily.     Polyethyl Glycol-Propyl Glycol (SYSTANE OP) Place 1 drop into both eyes as needed.     TURMERIC PO Take 1 capsule by mouth daily as needed (pain/inflammation).     celecoxib (CELEBREX) 200 MG capsule Take 200 mg by mouth 2 (two) times daily. (Patient not taking: Reported on 01/27/2024)     No facility-administered medications prior to visit.    Allergies  Allergen Reactions   Doxycycline  Rash    Review of Systems  Constitutional:  Negative for chills, fever and malaise/fatigue.  Respiratory:  Negative for shortness of breath.   Cardiovascular:  Negative for chest pain, palpitations and leg swelling.  Gastrointestinal:  Negative for abdominal pain, constipation, diarrhea, nausea and vomiting.  Genitourinary:  Negative for dysuria, frequency and urgency.  Musculoskeletal:  Positive for back pain.  Neurological:  Positive for sensory change. Negative for dizziness, tingling, focal weakness and headaches.       Objective:     Physical Exam Constitutional:      General: She is not in acute  distress.    Appearance: She is not ill-appearing.  Eyes:  Extraocular Movements: Extraocular movements intact.     Conjunctiva/sclera: Conjunctivae normal.  Cardiovascular:     Rate and Rhythm: Normal rate and regular rhythm.  Pulmonary:     Effort: Pulmonary effort is normal.     Breath sounds: Normal breath sounds.  Musculoskeletal:     Cervical back: Normal range of motion and neck supple.     Right lower leg: No edema.     Left lower leg: Normal. No tenderness. No edema.     Left ankle: Normal. No swelling. Normal range of motion. Normal pulse.     Left foot: Normal range of motion and normal capillary refill. No swelling. Normal pulse.       Legs:     Comments: Subjective decreased sensation to light touch and monofilament. LLE is neurovascularly intact.  Back with good sensation and motion.   Skin:    General: Skin is warm and dry.     Findings: No rash.  Neurological:     General: No focal deficit present.     Mental Status: She is alert and oriented to person, place, and time.     Cranial Nerves: No cranial nerve deficit.     Motor: No weakness.     Coordination: Coordination normal.     Gait: Gait normal.  Psychiatric:        Mood and Affect: Mood normal.        Behavior: Behavior normal.        Thought Content: Thought content normal.      BP 104/66 (BP Location: Left Arm, Patient Position: Sitting)   Pulse 70   Temp 97.6 F (36.4 C) (Temporal)   Ht 5\' 6"  (1.676 m)   Wt 191 lb (86.6 kg)   LMP 09/17/2016 (Approximate)   SpO2 98%   BMI 30.83 kg/m  Wt Readings from Last 3 Encounters:  01/27/24 191 lb (86.6 kg)  12/15/23 192 lb (87.1 kg)  11/21/23 188 lb 9.6 oz (85.5 kg)       Assessment & Plan:   Problem List Items Addressed This Visit   None Visit Diagnoses       Numbness of left lower extremity    -  Primary   Relevant Orders   CBC with Differential/Platelet   Comprehensive metabolic panel with GFR   Vitamin B12   TSH   T4, free    Ferritin   DG Lumbar Spine 2-3 Views (Completed)     Left foot pain         Pain of lumbar spine       Relevant Orders   DG Lumbar Spine 2-3 Views (Completed)      New concern of numbness of anterior lower left leg x 6 days. No injury. Hx of chronic back pain. No red flag symptoms. She has good ROM and strength to bilateral LEs.  Chronic left foot pain.  Check labs and a lumbar spine X ray.  Manage back conservatively as she has in the past.  No request for additional pain medication.  She will follow up with PCP in 2 weeks approximately.   I am having Jhoana P. Lezama maintain her Vitamin D , PARoxetine , losartan , TURMERIC PO, Calcium  Carb-Cholecalciferol (OYSTER SHELL CALCIUM  250+D PO), vitamin C, Polyethyl Glycol-Propyl Glycol (SYSTANE OP), aspirin  EC, Klor-Con  M20, oxyCODONE , metoprolol  succinate, celecoxib, HYDROcodone -acetaminophen , and pantoprazole .  No orders of the defined types were placed in this encounter.

## 2024-01-27 NOTE — Telephone Encounter (Signed)
  Chief Complaint: numbness to the anterior  left lower leg and foot  Symptoms: no numbness to back of leg on the anterior side  Frequency: 6 days  Pertinent Negatives: Patient denies speech, vision or walking difficulty, weakness/numbness to face arms or right leg and posterior leg Disposition: [] ED /[] Urgent Care (no appt availability in office) / [x] Appointment(In office/virtual)/ []  [facility]/ [] Home Care/ [] Refused Recommended Disposition /[]  Mobile Bus/ []  Follow-up with PCP Additional Notes:  Copied from CRM #[id number]. Topic: Clinical - Red Word Triage >> Jan 27, 2024 12:36 PM Juleen Oakland F wrote: Red Word that prompted transfer to Nurse Triage: Patient feeling numbness in left leg, foot, toes since last Saturday Reason for Disposition  [1] Numbness (i.e., loss of sensation) of the face, arm / hand, or leg / foot on one side of the body AND [2] gradual onset (e.g., days to weeks) AND [3] present now  Answer Assessment - Initial Assessment Questions 1. SYMPTOM: "What is the main symptom you are concerned about?" (e.g., weakness, numbness)     Numbness to left leg foot goes to front thinks it starts to foot and goes 1 inch below knee decreased sensation  2. ONSET: "When did this start?" (minutes, hours, days; while sleeping)     Last Saturday  4. PATTERN "Does this come and go, or has it been constant since it started?"  "Is it present now?"     constant 5. CARDIAC SYMPTOMS: "Have you had any of the following symptoms: chest pain, difficulty breathing, palpitations?"     no 6. NEUROLOGIC SYMPTOMS: "Have you had any of the following symptoms: headache, dizziness, vision loss, double vision, changes in speech, unsteady on your feet?"     no 7. OTHER SYMPTOMS: "Do you have any other symptoms?"   Feels warmer and "duller"  Protocols used: Neurologic Deficit-A-AH

## 2024-01-27 NOTE — Patient Instructions (Signed)
 Please go downstairs for labs and x-ray before you leave  If you have any new or worsening symptoms over the weekend such as loss of control of your bowels or bladder or worsening numbness, or new symptom of weakness, please go to the emergency department.

## 2024-01-30 ENCOUNTER — Ambulatory Visit (INDEPENDENT_AMBULATORY_CARE_PROVIDER_SITE_OTHER): Admitting: Podiatrist

## 2024-01-30 DIAGNOSIS — M792 Neuralgia and neuritis, unspecified: Secondary | ICD-10-CM | POA: Diagnosis not present

## 2024-01-30 NOTE — Progress Notes (Unsigned)
 Chief Complaint  Patient presents with   Numbness    Pt presents for Left foot numbness     HPI: Patient is 63 y.o. female who presents today for left foot numbness. Relates the numbness has been present for several weeks.  Denies any numbness on the right. Denies any recent trauma or injury.   Patient Active Problem List   Diagnosis Date Noted   Right upper quadrant abdominal pain 12/15/2023   Mixed conductive and sensorineural hearing loss of right ear with restricted hearing of left ear 08/15/2023   Hypokalemia 02/27/2023   Primary hypertension 02/27/2023   Family history of bladder cancer 07/05/2022   Intermittent gross hematuria 07/05/2022   Chronic sinusitis 08/25/2021   Prediabetes 08/25/2021   Paroxysmal SVT (supraventricular tachycardia) (HCC) 03/31/2021   Degenerative disc disease, cervical 10/13/2020   Stress incontinence of urine 10/31/2018   History of diverticulosis 05/04/2017   History of PSVT (paroxysmal supraventricular tachycardia) 06/24/2012   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   Multinodular goiter 05/12/2012   Sleep apnea 01/19/2008   IRRITABLE BOWEL SYNDROME 01/18/2008    Current Outpatient Medications on File Prior to Visit  Medication Sig Dispense Refill   Ascorbic Acid (VITAMIN C) 1000 MG tablet Take 2,000 mg by mouth at bedtime.     aspirin  EC 81 MG EC tablet Take 1 tablet (81 mg total) by mouth daily.     Calcium  Carb-Cholecalciferol (OYSTER SHELL CALCIUM  250+D PO) Take 1 tablet by mouth at bedtime.     celecoxib (CELEBREX) 200 MG capsule Take 200 mg by mouth 2 (two) times daily. (Patient not taking: Reported on 01/27/2024)     Cholecalciferol (VITAMIN D ) 50 MCG (2000 UT) tablet Take 3,000 Units by mouth at bedtime.     HYDROcodone -acetaminophen  (NORCO) 10-325 MG tablet Take 1 tablet by mouth 4 (four) times daily as needed.     KLOR-CON  M20 20 MEQ tablet TAKE 2 TABLETS (40 MEQ TOTAL) BY MOUTH DAILY. TAKE 2 TABLETS BY MOUTH EVERY DAY 60 tablet 23    losartan  (COZAAR ) 50 MG tablet Take 25 mg by mouth 2 (two) times daily.     metoprolol  succinate (TOPROL -XL) 25 MG 24 hr tablet TAKE 1 TABLET (25 MG) BY MOUTH IN THE MORNING AND AT BEDTIME 60 tablet 11   oxyCODONE  (ROXICODONE ) 5 MG immediate release tablet Take 1 tablet (5 mg total) by mouth every 4 (four) hours as needed for severe pain (pain score 7-10). 10 tablet 0   pantoprazole  (PROTONIX ) 40 MG tablet Take 1 tablet (40 mg total) by mouth daily. 30 tablet 3   PARoxetine  (PAXIL -CR) 25 MG 24 hr tablet Take 25 mg by mouth daily.     Polyethyl Glycol-Propyl Glycol (SYSTANE OP) Place 1 drop into both eyes as needed.     TURMERIC PO Take 1 capsule by mouth daily as needed (pain/inflammation).     No current facility-administered medications on file prior to visit.    Allergies  Allergen Reactions   Doxycycline  Rash    Review of Systems No fevers, chills, nausea, muscle aches, no difficulty breathing, no calf pain, no chest pain or shortness of breath.   Physical Exam  GENERAL APPEARANCE: Alert, conversant. Appropriately groomed. No acute distress.   VASCULAR: Pedal pulses palpable 2/4 DP and 2/4 PT bilateral.  Capillary refill time is immediate to all digits,  Proximal to distal cooling is warm to warm.  Digital perfusion adequate.   NEUROLOGIC: sensation is diminshed  5.07 monofilament at 2/5 sites left vs 5/5 sites  right   Light touch is decreased left vs rightl, vibratory sensation decreased left vs right.  Negative tinels sign with tapping of the tarsal canal noted left .  Subjective decrease in sensaion left vs right noted.   MUSCULOSKELETAL: acceptable muscle strength, tone and stability bilateral.  No gross boney pedal deformities noted.  No pain, crepitus or limitation noted with foot and ankle range of motion bilateral. No foot drop noted left.   DERMATOLOGIC: skin is warm, supple, and dry.  Color, texture, and turgor of skin within normal limits.  No open wounds are noted.  No  preulcerative lesions are seen.  Digital nails are asymptomatic.      Assessment    ICD-10-CM   1. Neuritis  M79.2 Ambulatory referral to Neurology       Plan  Discussed exam findings and subjective concerns.  Discussed there is no foot issue I can find that would explain the numbness she is experiencing.  Recommended follow up with neurology for further evaluation and possible testing.  Referral ordered.

## 2024-01-31 DIAGNOSIS — F411 Generalized anxiety disorder: Secondary | ICD-10-CM | POA: Diagnosis not present

## 2024-01-31 LAB — FERRITIN: Ferritin: 219.4 ng/mL (ref 10.0–291.0)

## 2024-01-31 LAB — TSH: TSH: 0.79 u[IU]/mL (ref 0.35–5.50)

## 2024-01-31 LAB — T4, FREE: Free T4: 0.68 ng/dL (ref 0.60–1.60)

## 2024-01-31 LAB — VITAMIN B12: Vitamin B-12: 375 pg/mL (ref 211–911)

## 2024-02-05 DIAGNOSIS — Z8249 Family history of ischemic heart disease and other diseases of the circulatory system: Secondary | ICD-10-CM | POA: Diagnosis not present

## 2024-02-05 DIAGNOSIS — F325 Major depressive disorder, single episode, in full remission: Secondary | ICD-10-CM | POA: Diagnosis not present

## 2024-02-05 DIAGNOSIS — Z809 Family history of malignant neoplasm, unspecified: Secondary | ICD-10-CM | POA: Diagnosis not present

## 2024-02-05 DIAGNOSIS — E669 Obesity, unspecified: Secondary | ICD-10-CM | POA: Diagnosis not present

## 2024-02-05 DIAGNOSIS — I10 Essential (primary) hypertension: Secondary | ICD-10-CM | POA: Diagnosis not present

## 2024-02-05 DIAGNOSIS — K219 Gastro-esophageal reflux disease without esophagitis: Secondary | ICD-10-CM | POA: Diagnosis not present

## 2024-02-05 DIAGNOSIS — F411 Generalized anxiety disorder: Secondary | ICD-10-CM | POA: Diagnosis not present

## 2024-02-05 DIAGNOSIS — G4733 Obstructive sleep apnea (adult) (pediatric): Secondary | ICD-10-CM | POA: Diagnosis not present

## 2024-02-05 DIAGNOSIS — I471 Supraventricular tachycardia, unspecified: Secondary | ICD-10-CM | POA: Diagnosis not present

## 2024-02-05 DIAGNOSIS — E785 Hyperlipidemia, unspecified: Secondary | ICD-10-CM | POA: Diagnosis not present

## 2024-02-05 DIAGNOSIS — M1990 Unspecified osteoarthritis, unspecified site: Secondary | ICD-10-CM | POA: Diagnosis not present

## 2024-02-05 DIAGNOSIS — Z9181 History of falling: Secondary | ICD-10-CM | POA: Diagnosis not present

## 2024-02-09 ENCOUNTER — Encounter: Payer: Self-pay | Admitting: Neurology

## 2024-02-13 ENCOUNTER — Ambulatory Visit: Admitting: Emergency Medicine

## 2024-02-13 DIAGNOSIS — F411 Generalized anxiety disorder: Secondary | ICD-10-CM | POA: Diagnosis not present

## 2024-02-27 DIAGNOSIS — F411 Generalized anxiety disorder: Secondary | ICD-10-CM | POA: Diagnosis not present

## 2024-03-12 ENCOUNTER — Ambulatory Visit (INDEPENDENT_AMBULATORY_CARE_PROVIDER_SITE_OTHER): Admitting: Neurology

## 2024-03-12 VITALS — BP 112/67 | HR 66

## 2024-03-12 DIAGNOSIS — R292 Abnormal reflex: Secondary | ICD-10-CM | POA: Diagnosis not present

## 2024-03-12 DIAGNOSIS — R200 Anesthesia of skin: Secondary | ICD-10-CM | POA: Diagnosis not present

## 2024-03-12 DIAGNOSIS — M1711 Unilateral primary osteoarthritis, right knee: Secondary | ICD-10-CM | POA: Diagnosis not present

## 2024-03-12 DIAGNOSIS — G8929 Other chronic pain: Secondary | ICD-10-CM

## 2024-03-12 DIAGNOSIS — M545 Low back pain, unspecified: Secondary | ICD-10-CM

## 2024-03-12 MED ORDER — CYCLOBENZAPRINE HCL 5 MG PO TABS: 5.0000 mg | ORAL_TABLET | Freq: Every evening | ORAL | 2 refills | Status: AC | PRN

## 2024-03-12 NOTE — Patient Instructions (Addendum)
 For low back pain, you can take flexeril  5mg  at bedtime  We will refer you to physical therapy for low back strengthening  MRI lumbar spine will be ordered.  It takes several weeks to get the results back, but once we have them, we will contact you to let you know the next steps.

## 2024-03-12 NOTE — Progress Notes (Signed)
 [facility] Clinic Note - Initial Visit   Date: 03/12/2024   [name] MRN: [id number] DOB: Sep 22, 1960   Dear Dr. Scherrie:  Thank you for your kind referral of [name] for consultation of left leg numbness. Although her history is well known to you, please allow us  to reiterate it for the purpose of our medical record. The patient was accompanied to the clinic by self.    [name] is a 63 y.o. left-handed female with hypertension, prediabetes, GERD, and depression presenting for evaluation of left leg numbness.   IMPRESSION/PLAN: Left lower leg and foot numbness (?L5 radiculopathy).  She had chronic low back pain and exam shows brisk reflexes at the knees.  She may have lumbar canal stenosis and radiculopathy.   Her exam does not suggest neuropathy. Fortunately, her sensation is returning back to normal in the left lower leg.   - MRI lumbar spine wo contrast  - Start PT for low back strengthening  - Start flexeril  5mg  at bedtime for low back pain.   Further recommendations pending results.   ------------------------------------------------------------- History of present illness: About three weeks ago, she began having numbness involving the left lower leg and ankle.  She denies tingling, weakness, or radicular leg pain.  She denies numbness of the right foot.  The prior event she recalls going for a walk, but there was no associated fall or pain.  She has chronic low back pain and occasional cramps.   Nonsmoker.  She does not drink alcohol.  Out-side paper records, electronic medical record, and images have been reviewed where available and summarized as:  Lab Results  Component Value Date   HGBA1C 6.3 12/07/2021   Lab Results  Component Value Date   VITAMINB12 375 01/27/2024   Lab Results  Component Value Date   TSH 0.79 01/27/2024   Lab Results  Component Value Date   ESRSEDRATE 28 12/07/2021     Past Medical History:  Diagnosis Date   Anemia    Anxiety    Arrhythmia 2013   Colon polyp 08/28/2012   Tubular adenoma   Dense breast    Depression    Genital warts    GERD (gastroesophageal reflux disease)    Heart murmur    Hemorrhoids    Hx of cardiac catheterization 2009   normal coronary arteries   Hx of menorrhagia    Novasure   Hypokalemia    Hypomagnesemia    Hypophosphatemia    Lactose intolerance in adult    Meniscus degeneration, right    torn r knee/ 10/2019   Mitral valve prolapse    Normal cardiac stress test 03/09/2012   no ischemia   Positive PPD    Rapid heart beat    States irreg heart beat   Sleep apnea    Thyroid  disease    Thyroid  nodules    Past Surgical History:  Procedure Laterality Date   CARDIAC CATHETERIZATION     x2 with normal results per pt   KNEE ARTHROSCOPY W/ MENISCECTOMY Right 08/2023   NOVASURE ABLATION     UPPER GASTROINTESTINAL ENDOSCOPY  01/12/2012     Medications:  Outpatient Encounter Medications as of 03/12/2024  Medication Sig Note   Ascorbic Acid (VITAMIN C) 1000 MG tablet Take 2,000 mg by mouth at bedtime.    aspirin  EC 81 MG EC tablet Take 1 tablet (81 mg total) by mouth daily.    Calcium  Carb-Cholecalciferol (OYSTER SHELL CALCIUM  250+D PO) Take 1  tablet by mouth at bedtime.    Cholecalciferol (VITAMIN D ) 50 MCG (2000 UT) tablet Take 3,000 Units by mouth at bedtime.    cyclobenzaprine  (FLEXERIL ) 5 MG tablet Take 1 tablet (5 mg total) by mouth at bedtime as needed for muscle spasms (low back pain).    HYDROcodone -acetaminophen  (NORCO) 10-325 MG tablet Take 1 tablet by mouth 4 (four) times daily as needed. 09/19/2023: On hand   KLOR-CON  M20 20 MEQ tablet TAKE 2 TABLETS (40 MEQ TOTAL) BY MOUTH DAILY. TAKE 2 TABLETS BY MOUTH EVERY DAY    losartan  (COZAAR ) 50 MG tablet Take 25 mg by mouth 2 (two) times daily.    metoprolol  succinate (TOPROL -XL) 25 MG 24 hr tablet TAKE 1 TABLET (25 MG) BY MOUTH IN THE MORNING AND AT  BEDTIME    oxyCODONE  (ROXICODONE ) 5 MG immediate release tablet Take 1 tablet (5 mg total) by mouth every 4 (four) hours as needed for severe pain (pain score 7-10). 09/19/2023: On hand   pantoprazole  (PROTONIX ) 40 MG tablet Take 1 tablet (40 mg total) by mouth daily. (Patient taking differently: Take 40 mg by mouth daily as needed.)    PARoxetine  (PAXIL -CR) 25 MG 24 hr tablet Take 25 mg by mouth daily.    Polyethyl Glycol-Propyl Glycol (SYSTANE OP) Place 1 drop into both eyes as needed.    TURMERIC PO Take 1 capsule by mouth daily as needed (pain/inflammation).    [DISCONTINUED] celecoxib (CELEBREX) 200 MG capsule Take 200 mg by mouth 2 (two) times daily. (Patient not taking: Reported on 01/27/2024) 09/19/2023: On hand   No facility-administered encounter medications on file as of 03/12/2024.    Allergies:  Allergies  Allergen Reactions   Doxycycline  Rash    Family History: Family History  Problem Relation Age of Onset   Heart disease Father        died Mi 63   Diabetes Maternal Grandmother    Hypertension Maternal Grandfather    Diabetes Maternal Grandfather    Heart disease Maternal Grandfather    Colon cancer Maternal Grandfather    Rectal cancer Maternal Grandfather    Prostate cancer Neg Hx    Breast cancer Neg Hx    Colon polyps Neg Hx    Esophageal cancer Neg Hx     Social History: Social History   Tobacco Use   Smoking status: Never   Smokeless tobacco: Never  Vaping Use   Vaping status: Never Used  Substance Use Topics   Alcohol use: No   Drug use: No   Social History   Social History Narrative   Ms. 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 She lived in [place]  [place]  and in [place] for a number of years her mom is from [place]. 16 years of education went to college at [employer] lives at home with her son who is in his 24s no pets   Neg ets tob etoh hx  PA   6 hours of sleep   G2P2    TD2010  colonoscopy 2009   Now running  own business    [employer] of 2    Left handed    Caffeine use: tea sometimes          Vital Signs:  BP 112/67   Pulse 66   LMP 09/17/2016 (Approximate)   SpO2 99%    Neurological Exam: MENTAL STATUS including orientation to time, place, person,  recent and remote memory, attention span and concentration, language, and fund of knowledge is normal.  Speech is not dysarthric.  CRANIAL NERVES: II:  No visual field defects.     III-IV-VI: Pupils equal round and reactive to light.  Normal conjugate, extra-ocular eye movements in all directions of gaze.  No nystagmus.  No ptosis.   V:  Normal facial sensation.    VII:  Normal facial symmetry and movements.   VIII:  Normal hearing and vestibular function.   IX-X:  Normal palatal movement.   XI:  Normal shoulder shrug and head rotation.   XII:  Normal tongue strength and range of motion, no deviation or fasciculation.  MOTOR:  No atrophy, fasciculations or abnormal movements.  No pronator drift.   Upper Extremity:  Right  Left  Deltoid  5/5   5/5   Biceps  5/5   5/5   Triceps  5/5   5/5   Wrist extensors  5/5   5/5   Wrist flexors  5/5   5/5   Finger extensors  5/5   5/5   Finger flexors  5/5   5/5   Dorsal interossei  5/5   5/5   Abductor pollicis  5/5   5/5   Tone (Ashworth scale)  0  0   Lower Extremity:  Right  Left  Hip flexors  5/5   5/5   Knee flexors  5/5   5/5   Knee extensors  5/5   5/5   Dorsiflexors  5/5   5/5   Plantarflexors  5/5   5/5   Toe extensors  5/5   5/5   Toe flexors  5/5   5/5   Tone (Ashworth scale)  0  0   MSRs:                                           Right        Left brachioradialis 2+  2+  biceps 2+  2+  triceps 2+  2+  patellar 3+  3+  ankle jerk 2+  2+  Hoffman no  no  plantar response down  down   SENSORY:  Normal and symmetric perception of light touch, pinprick, vibration, and proprioception.  Romberg's sign  absent.   COORDINATION/GAIT: Normal finger-to- nose-finger.  Intact rapid alternating movements bilaterally.  Able to rise from a chair without using arms.  Gait narrow based and stable. Tandem and stressed gait intact.     Thank you for allowing me to participate in patient's care.  If I can answer any additional questions, I would be pleased to do so.    Sincerely,    Ronelle Smallman K. Tobie, DO

## 2024-03-21 ENCOUNTER — Other Ambulatory Visit: Payer: Self-pay | Admitting: Emergency Medicine

## 2024-03-21 DIAGNOSIS — E876 Hypokalemia: Secondary | ICD-10-CM

## 2024-03-21 NOTE — Progress Notes (Signed)
 Called patients insurance [employer] and initiated [place] for MRI Lumbar Spine WO for CPT [phone].  Location- [facility]  Instructed that CPT [phone] needs to be submitted through [employer]. Call [phone]  Per [place] website: This Out of Network request does not require Prior Authorization through [employer]. Please contact [employer] on the back of the member's card to determine whether Prior [place] is required through [employer].  Called patient to inform that this PA is out of network and will have a higher cost. Had to leave a message for a call back.

## 2024-03-22 NOTE — Addendum Note (Signed)
 Addended by: DASIE RHODY A on: 03/22/2024 03:53 PM   Modules accepted: Orders

## 2024-03-22 NOTE — Progress Notes (Signed)
 Patient has returned call and would like to have her MRI done at [facility] due to [facility] being out of network. Patient will call and let us  know if she needs something to calm her before her MRI.

## 2024-03-22 NOTE — Progress Notes (Signed)
 Called patient and informed her that I will have to send her MRI to [facility] due to being in the network. Patient aware and had no further questions or concerns.

## 2024-03-22 NOTE — Progress Notes (Signed)
 Patient will need to have imaging at [facility] as they are the reccommended site.

## 2024-03-26 ENCOUNTER — Encounter: Payer: Self-pay | Admitting: Neurology

## 2024-03-27 ENCOUNTER — Encounter: Payer: Self-pay | Admitting: Neurology

## 2024-03-30 ENCOUNTER — Encounter: Payer: Self-pay | Admitting: Neurology

## 2024-04-02 ENCOUNTER — Encounter: Payer: Self-pay | Admitting: Neurology

## 2024-04-04 ENCOUNTER — Encounter: Payer: Self-pay | Admitting: Neurology

## 2024-04-06 NOTE — Therapy (Incomplete)
 OUTPATIENT PHYSICAL THERAPY THORACOLUMBAR EVALUATION   Patient Name: [name] MRN: [id number] DOB:1961-01-08, 63 y.o., female Today's Date: 04/06/2024  END OF SESSION:   Past Medical History:  Diagnosis Date   Anemia    Anxiety    Arrhythmia 2013   Colon polyp 08/28/2012   Tubular adenoma   Dense breast    Depression    Genital warts    GERD (gastroesophageal reflux disease)    Heart murmur    Hemorrhoids    Hx of cardiac catheterization 2009   normal coronary arteries   Hx of menorrhagia    Novasure   Hypokalemia    Hypomagnesemia    Hypophosphatemia    Lactose intolerance in adult    Meniscus degeneration, right    torn r knee/ 10/2019   Mitral valve prolapse    Normal cardiac stress test 03/09/2012   no ischemia   Positive PPD    Rapid heart beat    States irreg heart beat   Sleep apnea    Thyroid  disease    Thyroid  nodules   Past Surgical History:  Procedure Laterality Date   CARDIAC CATHETERIZATION     x2 with normal results per pt   KNEE ARTHROSCOPY W/ MENISCECTOMY Right 08/2023   NOVASURE ABLATION     UPPER GASTROINTESTINAL ENDOSCOPY  01/12/2012   Patient Active Problem List   Diagnosis Date Noted   Right upper quadrant abdominal pain 12/15/2023   Mixed conductive and sensorineural hearing loss of right ear with restricted hearing of left ear 08/15/2023   Hypokalemia 02/27/2023   Primary hypertension 02/27/2023   Family history of bladder cancer 07/05/2022   Intermittent gross hematuria 07/05/2022   Chronic sinusitis 08/25/2021   Prediabetes 08/25/2021   Paroxysmal SVT (supraventricular tachycardia) (HCC) 03/31/2021   Degenerative disc disease, cervical 10/13/2020   Stress incontinence of urine 10/31/2018   History of diverticulosis 05/04/2017   History of PSVT (paroxysmal supraventricular tachycardia) 06/24/2012   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   Multinodular goiter 05/12/2012   Sleep apnea 01/19/2008   IRRITABLE BOWEL  SYNDROME 01/18/2008    PCP:  [facility]cell Emil Schanz, MD    REFERRING PROVIDER: Tobie Tonita POUR, DO   REFERRING DIAG: R29.2 (ICD-10-CM) - Hyperreflexia R20.0 (ICD-10-CM) - Left leg numbness M54.50,G89.29 (ICD-10-CM) - Chronic midline low back pain without sciatica  Rationale for Evaluation and Treatment: Rehabilitation  THERAPY DIAG:  No diagnosis found.  ONSET DATE: 03/12/2024  SUBJECTIVE:  SUBJECTIVE STATEMENT: ***  PERTINENT HISTORY:  PMH: hypertension, prediabetes, GERD, and depression   Per Dr. Tobie: Left lower leg and foot numbness (?L5 radiculopathy). She had chronic low back pain and exam shows brisk reflexes at the knees. She may have lumbar canal stenosis and radiculopathy. Her exam does not suggest neuropathy. Fortunately, her sensation is returning back to normal in the left lower leg.   PAIN:  Are you having pain? {OPRCPAIN:27236}  PRECAUTIONS: {Therapy precautions:24002}  RED FLAGS: {PT Red Flags:29287}   WEIGHT BEARING RESTRICTIONS: {Yes ***/No:24003}  FALLS:  Has patient fallen in last 6 months? {fallsyesno:27318}  LIVING ENVIRONMENT: Lives with: {OPRC lives with:25569::lives with their family} Lives in: {Lives in:25570} Stairs: {opstairs:27293} Has following equipment at home: {Assistive devices:23999}  OCCUPATION: ***  PLOF: {PLOF:24004}  PATIENT GOALS: ***  NEXT MD VISIT: ***  OBJECTIVE:  Note: Objective measures were completed at Evaluation unless otherwise noted.  DIAGNOSTIC FINDINGS:  Has MRI of lumbar spine scheduled 04/08/24  PATIENT SURVEYS:  {rehab surveys:24030}  COGNITION: Overall cognitive status: {cognition:24006}     SENSATION: {sensation:27233}  MUSCLE LENGTH: Hamstrings: Right *** deg; Left *** deg Debby test: Right *** deg; Left  *** deg  POSTURE: {posture:25561}  PALPATION: ***  LUMBAR ROM:   AROM eval  Flexion   Extension   Right lateral flexion   Left lateral flexion   Right rotation   Left rotation    (Blank rows = not tested)  LOWER EXTREMITY ROM:     {AROM/PROM:27142}  Right eval Left eval  Hip flexion    Hip extension    Hip abduction    Hip adduction    Hip internal rotation    Hip external rotation    Knee flexion    Knee extension    Ankle dorsiflexion    Ankle plantarflexion    Ankle inversion    Ankle eversion     (Blank rows = not tested)  LOWER EXTREMITY MMT:    MMT Right eval Left eval  Hip flexion    Hip extension    Hip abduction    Hip adduction    Hip internal rotation    Hip external rotation    Knee flexion    Knee extension    Ankle dorsiflexion    Ankle plantarflexion    Ankle inversion    Ankle eversion     (Blank rows = not tested)  LUMBAR SPECIAL TESTS:  {lumbar special test:25242}  FUNCTIONAL TESTS:  {Functional tests:24029}  GAIT: Distance walked: *** Assistive device utilized: {Assistive devices:23999} Level of assistance: {Levels of assistance:24026} Comments: ***  TREATMENT DATE: ***                                                                                                                                 PATIENT EDUCATION:  Education details: *** Person educated: {Person educated:25204} Education method: {Education Method:25205} Education comprehension: {Education Comprehension:25206}  HOME EXERCISE PROGRAM: ***  ASSESSMENT:  CLINICAL IMPRESSION: Patient is a *** y.o. *** who was seen today for physical therapy evaluation and treatment for ***.   OBJECTIVE IMPAIRMENTS: {opptimpairments:25111}.   ACTIVITY LIMITATIONS: {activitylimitations:27494}  PARTICIPATION LIMITATIONS: {participationrestrictions:25113}  PERSONAL FACTORS: {Personal factors:25162} are also affecting patient's functional outcome.   REHAB  POTENTIAL: {rehabpotential:25112}  CLINICAL DECISION MAKING: {clinical decision making:25114}  EVALUATION COMPLEXITY: {Evaluation complexity:25115}   GOALS: Goals reviewed with patient? {yes/no:20286}  SHORT TERM GOALS: Target date: ***  *** Baseline: Goal status: INITIAL  2.  *** Baseline:  Goal status: INITIAL  3.  *** Baseline:  Goal status: INITIAL  4.  *** Baseline:  Goal status: INITIAL  5.  *** Baseline:  Goal status: INITIAL  6.  *** Baseline:  Goal status: INITIAL  LONG TERM GOALS: Target date: ***  *** Baseline:  Goal status: INITIAL  2.  *** Baseline:  Goal status: INITIAL  3.  *** Baseline:  Goal status: INITIAL  4.  *** Baseline:  Goal status: INITIAL  5.  *** Baseline:  Goal status: INITIAL  6.  *** Baseline:  Goal status: INITIAL  PLAN:  PT FREQUENCY: {rehab frequency:25116}  PT DURATION: {rehab duration:25117}  PLANNED INTERVENTIONS: {rehab planned interventions:25118::97110-Therapeutic exercises,97530- Therapeutic [phone]- Neuromuscular re-education,97535- Self Rjmz,02859- Manual therapy}.  PLAN FOR NEXT SESSION: ***   [place] LOISE Senate, PT, DPT 04/06/2024, 1:21 PM

## 2024-04-08 ENCOUNTER — Other Ambulatory Visit

## 2024-04-09 ENCOUNTER — Ambulatory Visit: Admitting: Physical Therapy

## 2024-04-27 ENCOUNTER — Emergency Department (HOSPITAL_COMMUNITY): Payer: Self-pay

## 2024-04-27 ENCOUNTER — Other Ambulatory Visit: Payer: Self-pay

## 2024-04-27 ENCOUNTER — Observation Stay (HOSPITAL_COMMUNITY)
Admission: EM | Admit: 2024-04-27 | Discharge: 2024-04-29 | Disposition: A | Discharge status: Home / Self Care | Attending: Family Medicine | Admitting: Family Medicine

## 2024-04-27 ENCOUNTER — Encounter (HOSPITAL_COMMUNITY): Payer: Self-pay

## 2024-04-27 DIAGNOSIS — R42 Dizziness and giddiness: Secondary | ICD-10-CM | POA: Diagnosis not present

## 2024-04-27 DIAGNOSIS — R55 Syncope and collapse: Principal | ICD-10-CM | POA: Insufficient documentation

## 2024-04-27 DIAGNOSIS — Z8679 Personal history of other diseases of the circulatory system: Secondary | ICD-10-CM | POA: Diagnosis not present

## 2024-04-27 DIAGNOSIS — Z7982 Long term (current) use of aspirin: Secondary | ICD-10-CM | POA: Insufficient documentation

## 2024-04-27 DIAGNOSIS — R000 Tachycardia, unspecified: Secondary | ICD-10-CM | POA: Diagnosis present

## 2024-04-27 DIAGNOSIS — G4730 Sleep apnea, unspecified: Secondary | ICD-10-CM | POA: Diagnosis not present

## 2024-04-27 DIAGNOSIS — E042 Nontoxic multinodular goiter: Secondary | ICD-10-CM | POA: Diagnosis present

## 2024-04-27 DIAGNOSIS — I10 Essential (primary) hypertension: Secondary | ICD-10-CM | POA: Diagnosis not present

## 2024-04-27 DIAGNOSIS — Z79899 Other long term (current) drug therapy: Secondary | ICD-10-CM | POA: Diagnosis not present

## 2024-04-27 HISTORY — DX: Ventricular premature depolarization: I49.3

## 2024-04-27 HISTORY — DX: Supraventricular tachycardia, unspecified: I47.10

## 2024-04-27 LAB — CBC
HCT: 39.5 % (ref 36.0–46.0)
HCT: 38.6 % (ref 36.0–46.0)
Hemoglobin: 12.5 g/dL (ref 12.0–15.0)
Hemoglobin: 12 g/dL (ref 12.0–15.0)
MCH: 24 pg — ABNORMAL LOW (ref 26.0–34.0)
MCH: 23.4 pg — ABNORMAL LOW (ref 26.0–34.0)
MCHC: 31.6 g/dL (ref 30.0–36.0)
MCHC: 31.1 g/dL (ref 30.0–36.0)
MCV: 76 fL — ABNORMAL LOW (ref 80.0–100.0)
MCV: 75.4 fL — ABNORMAL LOW (ref 80.0–100.0)
Platelets: 203 K/uL (ref 150–400)
Platelets: 200 K/uL (ref 150–400)
RBC: 5.2 MIL/uL — ABNORMAL HIGH (ref 3.87–5.11)
RBC: 5.12 MIL/uL — ABNORMAL HIGH (ref 3.87–5.11)
RDW: 14.5 % (ref 11.5–15.5)
RDW: 14.4 % (ref 11.5–15.5)
WBC: 4.4 K/uL (ref 4.0–10.5)
WBC: 4.8 K/uL (ref 4.0–10.5)
nRBC: 0 % (ref 0.0–0.2)
nRBC: 0 % (ref 0.0–0.2)

## 2024-04-27 LAB — COMPREHENSIVE METABOLIC PANEL WITH GFR
ALT: 18 U/L (ref 0–44)
AST: 24 U/L (ref 15–41)
Albumin: 3.5 g/dL (ref 3.5–5.0)
Alkaline Phosphatase: 53 U/L (ref 38–126)
Anion gap: 9 (ref 5–15)
BUN: 8 mg/dL (ref 8–23)
CO2: 22 mmol/L (ref 22–32)
Calcium: 8.7 mg/dL — ABNORMAL LOW (ref 8.9–10.3)
Chloride: 111 mmol/L (ref 98–111)
Creatinine, Ser: 0.92 mg/dL (ref 0.44–1.00)
GFR, Estimated: >60 mL/min (ref >60)
Glucose, Bld: 123 mg/dL — ABNORMAL HIGH (ref 70–99)
Potassium: 3.5 mmol/L (ref 3.5–5.1)
Sodium: 142 mmol/L (ref 135–145)
Total Bilirubin: 0.7 mg/dL (ref 0.0–1.2)
Total Protein: 6.4 g/dL — ABNORMAL LOW (ref 6.5–8.1)

## 2024-04-27 LAB — URINALYSIS, ROUTINE W REFLEX MICROSCOPIC
Bilirubin Urine: NEGATIVE
Glucose, UA: NEGATIVE mg/dL
Hgb urine dipstick: NEGATIVE
Ketones, ur: NEGATIVE mg/dL
Nitrite: NEGATIVE
Protein, ur: NEGATIVE mg/dL
Specific Gravity, Urine: 1.014 (ref 1.005–1.030)
WBC, UA: >50 WBC/hpf (ref 0–5)
pH: 8 (ref 5.0–8.0)

## 2024-04-27 LAB — RAPID URINE DRUG SCREEN, HOSP PERFORMED
Amphetamines: NOT DETECTED
Barbiturates: NOT DETECTED
Benzodiazepines: NOT DETECTED
Cocaine: NOT DETECTED
Opiates: NOT DETECTED
Tetrahydrocannabinol: NOT DETECTED

## 2024-04-27 LAB — TROPONIN I (HIGH SENSITIVITY)
Troponin I (High Sensitivity): 23 ng/L — ABNORMAL HIGH (ref <18)
Troponin I (High Sensitivity): 81 ng/L — ABNORMAL HIGH (ref <18)

## 2024-04-27 LAB — MAGNESIUM: Magnesium: 1.8 mg/dL (ref 1.7–2.4)

## 2024-04-27 LAB — HIV ANTIBODY (ROUTINE TESTING W REFLEX): HIV Screen 4th Generation wRfx: NONREACTIVE

## 2024-04-27 LAB — D-DIMER, QUANTITATIVE: D-Dimer, Quant: <0.27 ug{FEU}/mL (ref 0.00–0.50)

## 2024-04-27 LAB — CREATININE, SERUM
Creatinine, Ser: 0.77 mg/dL (ref 0.44–1.00)
GFR, Estimated: >60 mL/min (ref >60)

## 2024-04-27 MED ORDER — ONDANSETRON HCL 4 MG PO TABS
4.0000 mg | ORAL_TABLET | Freq: Four times a day (QID) | ORAL | Status: DC | PRN
Start: 2024-04-27 — End: 2024-04-29

## 2024-04-27 MED ORDER — DOCUSATE SODIUM 100 MG PO CAPS
100.0000 mg | ORAL_CAPSULE | Freq: Two times a day (BID) | ORAL | Status: DC
  Administered 2024-04-27 – 2024-04-29 (×5): 100 mg via ORAL
  Filled 2024-04-27 (×5): qty 1

## 2024-04-27 MED ORDER — PANTOPRAZOLE SODIUM 40 MG PO TBEC
40.0000 mg | DELAYED_RELEASE_TABLET | Freq: Every day | ORAL | Status: DC
  Administered 2024-04-28 – 2024-04-29 (×2): 40 mg via ORAL
  Filled 2024-04-27 (×2): qty 1

## 2024-04-27 MED ORDER — METOPROLOL SUCCINATE ER 25 MG PO TB24
25.0000 mg | ORAL_TABLET | Freq: Every day | ORAL | Status: DC
  Administered 2024-04-28: 25 mg via ORAL
  Filled 2024-04-27: qty 1

## 2024-04-27 MED ORDER — CYCLOBENZAPRINE HCL 10 MG PO TABS: 5.0000 mg | ORAL_TABLET | Freq: Every evening | ORAL | Status: DC | PRN

## 2024-04-27 MED ORDER — SODIUM CHLORIDE 0.9 % IV SOLN: INTRAVENOUS | Status: AC

## 2024-04-27 MED ORDER — ACETAMINOPHEN 325 MG PO TABS: 650.0000 mg | ORAL_TABLET | Freq: Four times a day (QID) | ORAL | Status: DC | PRN

## 2024-04-27 MED ORDER — ASPIRIN 81 MG PO TBEC
81.0000 mg | DELAYED_RELEASE_TABLET | Freq: Every day | ORAL | Status: DC
  Administered 2024-04-27 – 2024-04-29 (×3): 81 mg via ORAL
  Filled 2024-04-27 (×3): qty 1

## 2024-04-27 MED ORDER — ONDANSETRON HCL 4 MG/2ML IJ SOLN
4.0000 mg | Freq: Four times a day (QID) | INTRAMUSCULAR | Status: DC | PRN
Start: 2024-04-27 — End: 2024-04-29

## 2024-04-27 MED ORDER — HEPARIN SODIUM (PORCINE) 5000 UNIT/ML IJ SOLN
5000.0000 [IU] | Freq: Three times a day (TID) | INTRAMUSCULAR | Status: DC
  Administered 2024-04-27 (×2): 5000 [IU] via SUBCUTANEOUS
  Filled 2024-04-27 (×4): qty 1

## 2024-04-27 MED ORDER — PAROXETINE HCL ER 12.5 MG PO TB24
25.0000 mg | ORAL_TABLET | Freq: Every day | ORAL | Status: DC
  Administered 2024-04-28 – 2024-04-29 (×2): 25 mg via ORAL
  Filled 2024-04-27 (×3): qty 2

## 2024-04-27 MED ORDER — LACTATED RINGERS IV BOLUS
1000.0000 mL | Freq: Once | INTRAVENOUS | Status: AC
  Administered 2024-04-27: 1000 mL via INTRAVENOUS

## 2024-04-27 MED ORDER — LOSARTAN POTASSIUM 25 MG PO TABS: 25.0000 mg | ORAL_TABLET | Freq: Two times a day (BID) | ORAL | Status: DC

## 2024-04-27 MED ORDER — ACETAMINOPHEN 650 MG RE SUPP: 650.0000 mg | Freq: Four times a day (QID) | RECTAL | Status: DC | PRN

## 2024-04-27 NOTE — H&P (Addendum)
 History and Physical    [name] FMW:[id number] DOB: April 18, 1961 DOA: 04/27/2024  PCP: Purcell Emil Schanz, MD   Patient coming from:  Home.  I have personally briefly reviewed patient's old medical records in [facility]  Chief Complaint:  Lightheadedness, presyncopal episode.  HPI: [name] is a 63 y.o. female with medical history significant of essential hypertension, anxiety, depression, GERD presented in the ED with lightheadedness. Patient reports she was going to the work today,  on the way to the work she felt severely lightheaded, felt like she is about to pass out.  She has made  stop at her neighbor's house and called the EMS.  Patient denies any chest pain, palpitations, shortness of breath.  When EMS arrived she was hypertensive and tachycardic,  her heart rate was 150.  Which spontaneously improved to 70s and 80s.  Patient was brought in the ED.  Patient  still reports lightheadedness but denies any chest pain or palpitations.  She denies any recent travel or, sick contacts. She reports having similar episode in 2023 where echocardiogram was done which was unremarkable.   ED Course: She was hemodynamically stable. Labs include sodium 142, potassium 3.5, chloride 111, bicarb 22, glucose 123, BUN 8, creatinine 0.92, calcium  8.7, anion gap 9, magnesium  1.8, alkaline phosphatase 53, AST 24, ALT 18, total protein 6.7, troponin 23 > 81, WBC 4.4, hemoglobin 12.5, hematocrit 39.5, platelet 203, EKG shows sinus rhythm. Chest X-ray no abnormality.   Review of Systems: Review of Systems  Constitutional: Negative.   HENT: Negative.    Respiratory: Negative.    Cardiovascular:  Positive for chest pain and palpitations.  Gastrointestinal: Negative.   Genitourinary: Negative.   Musculoskeletal: Negative.   Skin: Negative.   Neurological:  Positive for dizziness.  Endo/Heme/Allergies: Negative.   Psychiatric/Behavioral:  Positive for depression.       Past Medical History:  Diagnosis Date   Anemia    Anxiety    Arrhythmia 2013   Colon polyp 08/28/2012   Tubular adenoma   Dense breast    Depression    Genital warts    GERD (gastroesophageal reflux disease)    Heart murmur    Hemorrhoids    Hx of cardiac catheterization 2009   normal coronary arteries   Hx of menorrhagia    Novasure   Hypokalemia    Hypomagnesemia    Hypophosphatemia    Lactose intolerance in adult    Meniscus degeneration, right    torn r knee/ 10/2019   Mitral valve prolapse    Normal cardiac stress test 03/09/2012   no ischemia   Positive PPD    Rapid heart beat    States irreg heart beat   Sleep apnea    Thyroid  disease    Thyroid  nodules    Past Surgical History:  Procedure Laterality Date   CARDIAC CATHETERIZATION     x2 with normal results per pt   KNEE ARTHROSCOPY W/ MENISCECTOMY Right 08/2023   NOVASURE ABLATION     UPPER GASTROINTESTINAL ENDOSCOPY  01/12/2012     reports that she has never smoked. She has never used smokeless tobacco. She reports that she does not drink alcohol and does not use drugs.  Allergies  Allergen Reactions   Doxycycline  Rash    Family History  Problem Relation Age of Onset   Heart disease Father        died Mi 41   Diabetes Maternal Grandmother    Hypertension Maternal Grandfather  Diabetes Maternal Grandfather    Heart disease Maternal Grandfather    Colon cancer Maternal Grandfather    Rectal cancer Maternal Grandfather    Prostate cancer Neg Hx    Breast cancer Neg Hx    Colon polyps Neg Hx    Esophageal cancer Neg Hx     Family history reviewed and not pertinent .  Prior to Admission medications   Medication Sig Start Date End Date Taking? Authorizing Provider  Ascorbic Acid (VITAMIN C) 1000 MG tablet Take 2,000 mg by mouth at bedtime.    [provider]  aspirin  EC 81 MG EC tablet Take 1 tablet (81 mg total) by mouth daily. 10/21/19   Claudene Pacific, MD  Calcium   Carb-Cholecalciferol (OYSTER SHELL CALCIUM  250+D PO) Take 1 tablet by mouth at bedtime.    [provider]  Cholecalciferol (VITAMIN D ) 50 MCG (2000 UT) tablet Take 3,000 Units by mouth at bedtime.    [provider]  cyclobenzaprine  (FLEXERIL ) 5 MG tablet Take 1 tablet (5 mg total) by mouth at bedtime as needed for muscle spasms (low back pain). 03/12/24   Patel, Donika K, DO  HYDROcodone -acetaminophen  (NORCO) 10-325 MG tablet Take 1 tablet by mouth 4 (four) times daily as needed. 08/25/23   [provider]  losartan  (COZAAR ) 50 MG tablet Take 25 mg by mouth 2 (two) times daily.    [provider]  metoprolol  succinate (TOPROL -XL) 25 MG 24 hr tablet TAKE 1 TABLET (25 MG) BY MOUTH IN THE MORNING AND AT BEDTIME 09/09/23   Meng, Hao, PA  oxyCODONE  (ROXICODONE ) 5 MG immediate release tablet Take 1 tablet (5 mg total) by mouth every 4 (four) hours as needed for severe pain (pain score 7-10). 08/26/23   Hildegard Loge, PA-C  pantoprazole  (PROTONIX ) 40 MG tablet Take 1 tablet (40 mg total) by mouth daily. Patient taking differently: Take 40 mg by mouth daily as needed. 11/09/23   Pyrtle, Gordy HERO, MD  PARoxetine  (PAXIL -CR) 25 MG 24 hr tablet Take 25 mg by mouth daily.    [provider]  Polyethyl Glycol-Propyl Glycol (SYSTANE OP) Place 1 drop into both eyes as needed.    [provider]  potassium chloride  SA (KLOR-CON  M) 20 MEQ tablet TAKE 2 TABLETS (40 MEQ TOTAL) BY MOUTH DAILY. 03/21/24   Purcell Emil Schanz, MD  TURMERIC PO Take 1 capsule by mouth daily as needed (pain/inflammation).    [provider]    Physical Exam: Vitals:   04/27/24 1019 04/27/24 1245 04/27/24 1411  BP: 117/84 112/70   Pulse: 98 69   Resp: 18 15   Temp: 99 F (37.2 C)  98.6 F (37 C)  TempSrc: Oral  Temporal  SpO2: 100% 100%   Weight: 83 kg    Height: 5' 6 (1.676 m)      Constitutional: NAD, calm, comfortable, not in any acute distress. Vitals:   04/27/24  1019 04/27/24 1245 04/27/24 1411  BP: 117/84 112/70   Pulse: 98 69   Resp: 18 15   Temp: 99 F (37.2 C)  98.6 F (37 C)  TempSrc: Oral  Temporal  SpO2: 100% 100%   Weight: 83 kg    Height: 5' 6 (1.676 m)     Eyes: PERRL, lids and conjunctivae normal ENMT: Mucous membranes are moist. Posterior pharynx clear of any exudate or lesions.Normal dentition.  Neck: normal, supple, no masses, no thyromegaly Respiratory: clear to auscultation bilaterally, no wheezing, no crackles. Normal respiratory effort. No accessory muscle  use.  Cardiovascular: S1 S2 heard, regular rate and rhythm, no murmurs / rubs / gallops. No extremity edema.  Abdomen: no tenderness, no masses palpated. No hepatosplenomegaly. Bowel sounds positive.  Musculoskeletal: no clubbing / cyanosis. No joint deformity upper and lower extremities. Good ROM, no contractures. Normal muscle tone.  Skin: no rashes, lesions, ulcers. No induration Neurologic: CN 2-12 grossly intact. Sensation intact, DTR normal. Strength 5/5 in all 4.  Psychiatric: Normal judgment and insight. Alert and oriented x 3. Normal mood.    Labs on Admission: I have personally reviewed following labs and imaging studies  CBC: Recent Labs  Lab 04/27/24 1040  WBC 4.4  HGB 12.5  HCT 39.5  MCV 76.0*  PLT 203   Basic Metabolic Panel: Recent Labs  Lab 04/27/24 1040  NA 142  K 3.5  CL 111  CO2 22  GLUCOSE 123*  BUN 8  CREATININE 0.92  CALCIUM  8.7*  MG 1.8   GFR: Estimated Creatinine Clearance: 67.1 mL/min (by C-G formula based on SCr of 0.92 mg/dL). Liver Function Tests: Recent Labs  Lab 04/27/24 1040  AST 24  ALT 18  ALKPHOS 53  BILITOT 0.7  PROT 6.4*  ALBUMIN 3.5   No results for input(s): LIPASE, AMYLASE in the last 168 hours. No results for input(s): AMMONIA in the last 168 hours. Coagulation Profile: No results for input(s): INR, PROTIME in the last 168 hours. Cardiac Enzymes: No results for input(s): CKTOTAL,  CKMB, CKMBINDEX, TROPONINI in the last 168 hours. BNP (last 3 results) No results for input(s): PROBNP in the last 8760 hours. HbA1C: No results for input(s): HGBA1C in the last 72 hours. CBG: No results for input(s): GLUCAP in the last 168 hours. Lipid Profile: No results for input(s): CHOL, HDL, LDLCALC, TRIG, CHOLHDL, LDLDIRECT in the last 72 hours. Thyroid  Function Tests: No results for input(s): TSH, T4TOTAL, FREET4, T3FREE, THYROIDAB in the last 72 hours. Anemia Panel: No results for input(s): VITAMINB12, FOLATE, FERRITIN, TIBC, IRON, RETICCTPCT in the last 72 hours. Urine analysis:    Component Value Date/Time   COLORURINE YELLOW 12/15/2023 1459   APPEARANCEUR CLEAR 12/15/2023 1459   LABSPEC >=1.030 (A) 12/15/2023 1459   PHURINE 5.5 12/15/2023 1459   GLUCOSEU NEGATIVE 12/15/2023 1459   HGBUR NEGATIVE 12/15/2023 1459   BILIRUBINUR NEGATIVE 12/15/2023 1459   BILIRUBINUR positive 02/04/2023 1334   KETONESUR NEGATIVE 12/15/2023 1459   PROTEINUR Positive (A) 02/04/2023 1334   PROTEINUR NEGATIVE 07/14/2022 2000   UROBILINOGEN 0.2 12/15/2023 1459   NITRITE NEGATIVE 12/15/2023 1459   LEUKOCYTESUR SMALL (A) 12/15/2023 1459    Radiological Exams on Admission: DG Chest Portable 1 View Result Date: 04/27/2024 CLINICAL DATA:  Chest pain EXAM: PORTABLE CHEST 1 VIEW COMPARISON:  06/30/2022. FINDINGS: The heart size and mediastinal contours are within normal limits. No focal consolidation, pleural effusion, or pneumothorax. No acute osseous abnormality. IMPRESSION: No acute cardiopulmonary findings. Electronically Signed   By: Harrietta Sherry M.D.   On: 04/27/2024 10:47    EKG: Independently reviewed. Normal sinus rhythm  Assessment/Plan Principal Problem:   Postural dizziness with presyncope Active Problems:   Sleep apnea   Multinodular goiter   History of PSVT (paroxysmal supraventricular tachycardia)   Primary hypertension  Pre-  Syncope: Patient presented with severe lightheadedness while going towards the work. She denies any chest pain, palpitations, chest pain, shortness of breath. EKG > normal sinus rhythm. Troponin slightly trending up 23 >81. Continue aspirin , Cozaar , metoprolol . Obtain TSH, lipid profile. Cardiology is consulted.  Awaiting recommendation.  D-dimer normal,  less likely PE or acute DVT.  Elevated troponin: Could be due to demand ischemia,  Patient denies any chest pain, palpitations, shortness of breath. Continue to trend troponin and start heparin  if continues to remain elevated. Obtain 2D echocardiogram Follow-up cardiology  Essential hypertension Continue metoprolol  and losartan .  Depression/anxiety Continue Paxil   GERD Continue pantoprazole     DVT prophylaxis: Heparin  Code Status: Full code Family Communication: No family at bed side Disposition Plan:    Status is: Observation The patient remains OBS appropriate and will d/c before 2 midnights.   Consults called: Cardiology Admission status: Observation   Darcel Dawley MD [facility]   If 7PM-7AM, please contact night-coverage   04/27/2024, 3:58 PM

## 2024-04-27 NOTE — ED Triage Notes (Signed)
 Pt BIB [facility] from neighbors house with c/io generalized weakness and palpitations. EMS reports HR at 144-150. Hx of Mitral valve prolapse. Denies SOB or chest pain.   Ems - 20g LAC 500 NS bolus

## 2024-04-27 NOTE — ED Notes (Signed)
 CCMD called.

## 2024-04-27 NOTE — ED Provider Notes (Signed)
 [facility] DEPART[facility] AT [facility] Provider Note   CSN: [id number] Arrival date & time: 04/27/24  1007     Patient presents with: Tachycardia and Weakness   [name] is a 63 y.o. female.   This is a 63 year old female presenting emergency department with near syncope.  Was driving to work, felt lightheaded, was diaphoretic, noted she had some discomfort in her central chest as well.  No associated nausea vomiting.  Had some palpitations with history of the same.  Reports feeling some generalized weakness.  Denies URI symptoms.  Low risk for PE based on Wells criteria.   Weakness      Prior to Admission medications   Medication Sig Start Date End Date Taking? Authorizing Provider  Ascorbic Acid (VITAMIN C) 1000 MG tablet Take 2,000 mg by mouth at bedtime.    [provider]  aspirin  EC 81 MG EC tablet Take 1 tablet (81 mg total) by mouth daily. 10/21/19   Claudene Pacific, MD  Calcium  Carb-Cholecalciferol (OYSTER SHELL CALCIUM  250+D PO) Take 1 tablet by mouth at bedtime.    [provider]  Cholecalciferol (VITAMIN D ) 50 MCG (2000 UT) tablet Take 3,000 Units by mouth at bedtime.    [provider]  cyclobenzaprine  (FLEXERIL ) 5 MG tablet Take 1 tablet (5 mg total) by mouth at bedtime as needed for muscle spasms (low back pain). 03/12/24   Patel, Donika K, DO  HYDROcodone -acetaminophen  (NORCO) 10-325 MG tablet Take 1 tablet by mouth 4 (four) times daily as needed. 08/25/23   [provider]  losartan  (COZAAR ) 50 MG tablet Take 25 mg by mouth 2 (two) times daily.    [provider]  metoprolol  succinate (TOPROL -XL) 25 MG 24 hr tablet TAKE 1 TABLET (25 MG) BY MOUTH IN THE MORNING AND AT BEDTIME 09/09/23   Meng, Hao, PA  oxyCODONE  (ROXICODONE ) 5 MG immediate release tablet Take 1 tablet (5 mg total) by mouth every 4 (four) hours as needed for severe pain (pain score 7-10). 08/26/23   Hildegard Loge, PA-C  pantoprazole   (PROTONIX ) 40 MG tablet Take 1 tablet (40 mg total) by mouth daily. Patient taking differently: Take 40 mg by mouth daily as needed. 11/09/23   Pyrtle, Gordy HERO, MD  PARoxetine  (PAXIL -CR) 25 MG 24 hr tablet Take 25 mg by mouth daily.    [provider]  Polyethyl Glycol-Propyl Glycol (SYSTANE OP) Place 1 drop into both eyes as needed.    [provider]  potassium chloride  SA (KLOR-CON  M) 20 MEQ tablet TAKE 2 TABLETS (40 MEQ TOTAL) BY MOUTH DAILY. 03/21/24   Purcell Emil Schanz, MD  TURMERIC PO Take 1 capsule by mouth daily as needed (pain/inflammation).    [provider]    Allergies: Doxycycline     Review of Systems  Neurological:  Positive for weakness.    Updated Vital Signs BP 112/70   Pulse 69   Temp 98.6 F (37 C) (Temporal)   Resp 15   Ht 5' 6 (1.676 m)   Wt 83 kg   LMP 09/17/2016 (Approximate)   SpO2 100%   BMI 29.54 kg/m   Physical Exam Vitals and nursing note reviewed.  Constitutional:      General: She is not in acute distress.    Appearance: She is not toxic-appearing.  HENT:     Head: Normocephalic and atraumatic.     Nose: Nose normal.     Mouth/Throat:     Mouth: Mucous membranes are moist.  Eyes:     Conjunctiva/sclera: Conjunctivae normal.  Cardiovascular:     Rate and Rhythm: Normal rate and regular rhythm.  Pulmonary:     Effort: Pulmonary effort is normal.     Breath sounds: Normal breath sounds.  Abdominal:     General: Abdomen is flat. There is no distension.     Palpations: Abdomen is soft.     Tenderness: There is no abdominal tenderness. There is no guarding or rebound.  Musculoskeletal:     Right lower leg: No edema.     Left lower leg: No edema.  Skin:    General: Skin is warm and dry.     Capillary Refill: Capillary refill takes less than 2 seconds.  Neurological:     Mental Status: She is alert and oriented to person, place, and time.  Psychiatric:        Mood and Affect: Mood normal.        Behavior:  Behavior normal.     (all labs ordered are listed, but only abnormal results are displayed) Labs Reviewed  CBC - Abnormal; Notable for the following components:      Result Value   RBC 5.20 (*)    MCV 76.0 (*)    MCH 24.0 (*)    All other components within normal limits  COMPREHENSIVE METABOLIC PANEL WITH GFR - Abnormal; Notable for the following components:   Glucose, Bld 123 (*)    Calcium  8.7 (*)    Total Protein 6.4 (*)    All other components within normal limits  TROPONIN I (HIGH SENSITIVITY) - Abnormal; Notable for the following components:   Troponin I (High Sensitivity) 23 (*)    All other components within normal limits  TROPONIN I (HIGH SENSITIVITY) - Abnormal; Notable for the following components:   Troponin I (High Sensitivity) 81 (*)    All other components within normal limits  MAGNESIUM   D-DIMER, QUANTITATIVE  RAPID URINE DRUG SCREEN, HOSP PERFORMED  URINALYSIS, ROUTINE W REFLEX MICROSCOPIC    EKG: None  Radiology: DG Chest Portable 1 View Result Date: 04/27/2024 CLINICAL DATA:  Chest pain EXAM: PORTABLE CHEST 1 VIEW COMPARISON:  06/30/2022. FINDINGS: The heart size and mediastinal contours are within normal limits. No focal consolidation, pleural effusion, or pneumothorax. No acute osseous abnormality. IMPRESSION: No acute cardiopulmonary findings. Electronically Signed   By: Harrietta Sherry M.D.   On: 04/27/2024 10:47     Procedures   Medications Ordered in the ED  lactated ringers  bolus 1,000 mL (0 mLs Intravenous Stopped 04/27/24 1422)    Clinical Course as of 04/27/24 1520  Fri Apr 27, 2024  1132 D-Dimer, Quant: <0.27 PE less likely [TY]  1132 Troponin I (High Sensitivity)(!): 23 New elevation; will get delta trop. Suspect demand; ecg without ischemic changes.  [TY]  1438 Saw cardiology in 06/06/23: 1. Palpitation: During the last office visit, I changed her metoprolol  to tartrate to metoprolol  succinate to give her more coverage.  30-day heart  monitor shows no significant sustained arrhythmia.  She was previously seen in [facility] ED in June 2024 due to sustained tachyarrhythmia.  Symptom has not recurred again.  She does have symptomatic PVC.  We discussed Kardia mobile device to continue to monitor   2. Hypertension: Blood pressure well-controlled  [TY]  1438 Troponin I (High Sensitivity)(!): 81 Uptrending; plan for admission. Cardiology consulted.  [TY]    Clinical Course User Index [TY] Neysa Caron PARAS, DO  Medical Decision Making This is a 63 year old female presenting emergency department for chest pain, near syncope.  She is afebrile nontachycardic, normotensive.  Maintaining oxygen saturation on room air.  EKG appears to be sinus rhythm without overt ischemic changes.  Initial troponin with mild elevation with rise up to 81.  Appears to be sinus rhythm on the monitor.  Concern for possible PE however low risks.  D-dimer negative.  Acute DVT/PE less likely.  No fever tachycardia or leukocytosis to suggest infectious process.  No anemia.  Electrolytes without significant abnormality.  Patient continues to complain of some generalized weakness and feeling of generalized malaise.  Not having active chest pain.  Given her rising troponin we will plan to admit for observation  Amount and/or Complexity of Data Reviewed Labs: ordered. Decision-making details documented in ED Course. Radiology: ordered.  Risk Decision regarding hospitalization.      Final diagnoses:  Near syncope    ED Discharge Orders     None          Neysa Caron PARAS, DO 04/27/24 1520

## 2024-04-28 ENCOUNTER — Encounter (HOSPITAL_COMMUNITY): Payer: Self-pay | Admitting: Family Medicine

## 2024-04-28 ENCOUNTER — Observation Stay (HOSPITAL_BASED_OUTPATIENT_CLINIC_OR_DEPARTMENT_OTHER)

## 2024-04-28 ENCOUNTER — Inpatient Hospital Stay: Admission: RE | Admit: 2024-04-28 | Source: Ambulatory Visit

## 2024-04-28 ENCOUNTER — Other Ambulatory Visit: Payer: Self-pay | Admitting: Internal Medicine

## 2024-04-28 DIAGNOSIS — R55 Syncope and collapse: Secondary | ICD-10-CM

## 2024-04-28 DIAGNOSIS — R1319 Other dysphagia: Secondary | ICD-10-CM

## 2024-04-28 DIAGNOSIS — R002 Palpitations: Secondary | ICD-10-CM

## 2024-04-28 DIAGNOSIS — K219 Gastro-esophageal reflux disease without esophagitis: Secondary | ICD-10-CM

## 2024-04-28 DIAGNOSIS — R42 Dizziness and giddiness: Secondary | ICD-10-CM | POA: Diagnosis not present

## 2024-04-28 LAB — CBC
HCT: 37.3 % (ref 36.0–46.0)
Hemoglobin: 11.6 g/dL — ABNORMAL LOW (ref 12.0–15.0)
MCH: 23.7 pg — ABNORMAL LOW (ref 26.0–34.0)
MCHC: 31.1 g/dL (ref 30.0–36.0)
MCV: 76.1 fL — ABNORMAL LOW (ref 80.0–100.0)
Platelets: 199 K/uL (ref 150–400)
RBC: 4.9 MIL/uL (ref 3.87–5.11)
RDW: 14.6 % (ref 11.5–15.5)
WBC: 6.3 K/uL (ref 4.0–10.5)
nRBC: 0 % (ref 0.0–0.2)

## 2024-04-28 LAB — COMPREHENSIVE METABOLIC PANEL WITH GFR
ALT: 17 U/L (ref 0–44)
AST: 18 U/L (ref 15–41)
Albumin: 3.2 g/dL — ABNORMAL LOW (ref 3.5–5.0)
Alkaline Phosphatase: 46 U/L (ref 38–126)
Anion gap: 8 (ref 5–15)
BUN: 11 mg/dL (ref 8–23)
CO2: 24 mmol/L (ref 22–32)
Calcium: 8.7 mg/dL — ABNORMAL LOW (ref 8.9–10.3)
Chloride: 109 mmol/L (ref 98–111)
Creatinine, Ser: 0.79 mg/dL (ref 0.44–1.00)
GFR, Estimated: >60 mL/min (ref >60)
Glucose, Bld: 94 mg/dL (ref 70–99)
Potassium: 3.9 mmol/L (ref 3.5–5.1)
Sodium: 141 mmol/L (ref 135–145)
Total Bilirubin: 0.3 mg/dL (ref 0.0–1.2)
Total Protein: 5.8 g/dL — ABNORMAL LOW (ref 6.5–8.1)

## 2024-04-28 LAB — TROPONIN I (HIGH SENSITIVITY): Troponin I (High Sensitivity): 26 ng/L — ABNORMAL HIGH (ref <18)

## 2024-04-28 LAB — PHOSPHORUS: Phosphorus: 3.8 mg/dL (ref 2.5–4.6)

## 2024-04-28 LAB — MAGNESIUM: Magnesium: 1.9 mg/dL (ref 1.7–2.4)

## 2024-04-28 LAB — ECHOCARDIOGRAM COMPLETE
Area-P 1/2: 4.02 cm2
Calc EF: 65.7 %
Height: 66 in
S' Lateral: 2.64 cm
Single Plane A2C EF: 68 %
Single Plane A4C EF: 64.9 %
Weight: 3113.6 [oz_av]

## 2024-04-28 LAB — TSH: TSH: 1.371 u[IU]/mL (ref 0.350–4.500)

## 2024-04-28 MED ORDER — MAGNESIUM OXIDE -MG SUPPLEMENT 400 (240 MG) MG PO TABS
400.0000 mg | ORAL_TABLET | Freq: Two times a day (BID) | ORAL | Status: DC
Start: 2024-04-29 — End: 2024-04-29
  Administered 2024-04-29: 400 mg via ORAL
  Filled 2024-04-28: qty 1

## 2024-04-28 MED ORDER — POTASSIUM CHLORIDE CRYS ER 10 MEQ PO TBCR
10.0000 meq | EXTENDED_RELEASE_TABLET | Freq: Once | ORAL | Status: AC
  Administered 2024-04-28: 10 meq via ORAL
  Filled 2024-04-28: qty 1

## 2024-04-28 MED ORDER — MAGNESIUM SULFATE IN D5W 1-5 GM/100ML-% IV SOLN
1.0000 g | Freq: Once | INTRAVENOUS | Status: AC
  Administered 2024-04-28: 1 g via INTRAVENOUS
  Filled 2024-04-28: qty 100

## 2024-04-28 MED ORDER — POTASSIUM CHLORIDE CRYS ER 10 MEQ PO TBCR
10.0000 meq | EXTENDED_RELEASE_TABLET | Freq: Every day | ORAL | Status: DC
  Administered 2024-04-29: 10 meq via ORAL
  Filled 2024-04-28: qty 1

## 2024-04-28 MED ORDER — METOPROLOL SUCCINATE ER 25 MG PO TB24
25.0000 mg | ORAL_TABLET | Freq: Two times a day (BID) | ORAL | Status: DC
  Administered 2024-04-28 – 2024-04-29 (×2): 25 mg via ORAL
  Filled 2024-04-28 (×2): qty 1

## 2024-04-28 NOTE — Progress Notes (Addendum)
 Patient complained of feeling heart rate increase. Patient expressed concerns over toprol  xl being just once per day (states she takes twice daily at home) and potassium supplement not being ordered.   Dr Leotis notified, new order to add on troponin to this AMs labs received.  MD states he will review medications with patient when he rounds.

## 2024-04-28 NOTE — Consult Note (Addendum)
 Cardiology Consultation   Patient ID: [name] MRN: [id number]; DOB: 10-10-1960  Admit date: 04/27/2024 Date of Consult: 04/28/2024  PCP:  Danielle Danielle Schanz, MD   [facility] Providers Cardiologist:  Danielle Lesches, MD        Patient Profile: [name] is a 63 y.o. female with a hx of palpitations, PSVT, rare PACs/PVCs/brief NSVT, borderline HTN, anxiety, depression, GERD, reported sleep apnea (repeat study 2024 negative for OSA), thyroid  nodules, anemia hypokalemia, hypomagnesemia, hypophosphatemia who is being seen 04/28/2024 for the evaluation of elevated troponin, palpitations and near-syncope at the request of Dr. Leotis.  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 She had remote history of PSVT and mitral valve prolapse and was seen by Dr. Claudene before transitioning care to [facility] in 2024. Remote stress testing was normal. Cardiac cath 2007 and 2009 with normal coronaries. Plasma catecholamines 2009 unrevealing, had not had urine testing before. Monitor 10/2022 showed NSR, rare PACs/PVCs. Echo 01/2023 showed EF 55-60%, mildly enlarged RV, normal PASP, trivial MR. She had an admission at [facility] in 02/2023 for atypical chest pain/palpitations and dizziness suspected due to transient tachy-arrhythmia and elevated hs-troponins up to 247. Limited echo showed EF 55-60%, no RWMA, mild TR, no pericardial effusion. Cor CT during admission showed normal coronaries. Repeat monitor 04/2023 showed rare PACs/PVCs and one 5 beat run NSVT - patient triggers strongly correlated with PVCs. Metoprolol  was transitioned to succinate BID dosing for improved coverage. The most common trigger seems to be 30 minutes after eating a meal.  She has been in USOH recently except having some back issues and poor sleep. She would occasionally awaken with racing heart without provocation, though does feel in  general the BID dosing of metoprolol  has helped her general palpitations. Yesterday she was driving to work and suddenly felt very dizzy as if she might pass out for 15 seconds. Chest pain reported in initial H/P though she now denies this, reports symptom was primarily dizziness.  She pulled over and called EMS. Initial BP 200/90, HR 140 - documented as sinus tach in EMS run sheet. In the past when she had this in 02/2023 it was accompanied by rapid palpitations but this time it was not. She was transported to the hospital where initia EKG here showed  EKG shows NSR 92bpm, TW changes inferiorly and V3-V6, QTc wnl with varying degrees of TW changes in these leads in the past as well. hsTroponins 23-81-26. D-dimer wnl. K 3.5, Mg 1.8. UDS neg. CXR NAD. SBP 100/53-119/68.  She recognizes that since arrival to the ED she has had rare instances of recurrent tachypalpitations - she will feel a goosebumps type of feeling, then notice the monitor pinging for elevated HR. These episodes appear to correlate only to very transient low level sinus tach 1teens with gradual onset/offset, no SVT, atrial fib/flutter seen.    Past Medical History:  Diagnosis Date   Anemia    Anxiety    Arrhythmia 2013   Colon polyp 08/28/2012   Tubular adenoma   Dense breast    Depression    Genital warts    GERD (gastroesophageal reflux disease)    Heart murmur    Hemorrhoids    Hx of cardiac catheterization 2009   normal coronary arteries   Hx of menorrhagia    Novasure   Hypokalemia    Hypomagnesemia    Hypophosphatemia    Lactose intolerance in adult  Meniscus degeneration, right    torn r knee/ 10/2019   Mitral valve prolapse    Normal cardiac stress test 03/09/2012   no ischemia   Positive PPD    Rapid heart beat    States irreg heart beat   Sleep apnea    Thyroid  disease    Thyroid  nodules    Past Surgical History:  Procedure Laterality Date   CARDIAC CATHETERIZATION     x2 with normal results per  pt   KNEE ARTHROSCOPY W/ MENISCECTOMY Right 08/2023   NOVASURE ABLATION     UPPER GASTROINTESTINAL ENDOSCOPY  01/12/2012     Home Medications:  Prior to Admission medications   Medication Sig Start Date End Date Taking? Authorizing Provider  ALPRAZolam  (XANAX ) 0.5 MG tablet Take 0.5 mg by mouth at bedtime as needed for anxiety. For MRI   Yes [provider]  Ascorbic Acid (VITAMIN C) 1000 MG tablet Take 2,000 mg by mouth at bedtime.   Yes [provider]  Calcium  Carb-Cholecalciferol (OYSTER SHELL CALCIUM  250+D PO) Take 1 tablet by mouth at bedtime.   Yes [provider]  cholecalciferol (VITAMIN D3) 25 MCG (1000 UNIT) tablet Take 3,000 Units by mouth in the morning.   Yes [provider]  Cyanocobalamin  (VITAMIN B 12 PO) Take 1,000 mcg by mouth in the morning.   Yes [provider]  cyclobenzaprine  (FLEXERIL ) 5 MG tablet Take 1 tablet (5 mg total) by mouth at bedtime as needed for muscle spasms (low back pain). 03/12/24  Yes Hanson, Danielle K, DO  losartan  (COZAAR ) 25 MG tablet Take 25 mg by mouth at bedtime as needed.   Yes [provider]  magnesium  oxide (MAG-OX) 400 (240 Mg) MG tablet Take 400 mg by mouth daily.   Yes [provider]  metoprolol  succinate (TOPROL -XL) 25 MG 24 hr tablet TAKE 1 TABLET (25 MG) BY MOUTH IN THE MORNING AND AT BEDTIME Patient taking differently: Take 25 mg by mouth in the morning and at bedtime. 09/09/23  Yes Meng, Hao, PA  pantoprazole  (PROTONIX ) 40 MG tablet Take 1 tablet (40 mg total) by mouth daily. Patient taking differently: Take 40 mg by mouth daily as needed. 11/09/23  Yes Pyrtle, Gordy HERO, MD  PARoxetine  (PAXIL -CR) 25 MG 24 hr tablet Take 25 mg by mouth daily.   Yes [provider]  potassium chloride  SA (KLOR-CON  M) 20 MEQ tablet TAKE 2 TABLETS (40 MEQ TOTAL) BY MOUTH DAILY. Patient taking differently: Take 20 mEq by mouth in the morning and at bedtime. 03/21/24  Yes Sagardia, Danielle Schanz,  MD  TURMERIC PO Take 1 capsule by mouth daily as needed (pain/inflammation).   Yes [provider]  amphetamine-dextroamphetamine (ADDERALL) 5 MG tablet Take 5 mg by mouth in the morning and at bedtime. Patient not taking: Reported on 04/27/2024    [provider]  aspirin  EC 81 MG EC tablet Take 1 tablet (81 mg total) by mouth daily. Patient not taking: Reported on 04/27/2024 10/21/19   [name] [place], MD    Scheduled Meds:  aspirin  EC  81 mg Oral Daily   docusate sodium   100 mg Oral BID   heparin   5,000 Units Subcutaneous Q8H   metoprolol  succinate  25 mg Oral Daily   pantoprazole   40 mg Oral Daily   PARoxetine   25 mg Oral Daily   Continuous Infusions:  sodium chloride  50 mL/hr at 04/27/24 1627   PRN Meds: acetaminophen  **OR** acetaminophen , cyclobenzaprine , ondansetron  **OR** ondansetron  (ZOFRAN ) IV  Allergies:    Allergies  Allergen Reactions   Doxycycline  Rash    Social History:   Social History   Socioeconomic History   Marital status: Divorced    Spouse name: Not on file   Number of children: 2   Years of education: 16   Highest education level: Not on file  Occupational History   Occupation: dialysis tech    Employer: [employer]  Tobacco Use   Smoking status: Never   Smokeless tobacco: Never  Vaping Use   Vaping status: Never Used  Substance and Sexual Activity   Alcohol use: No   Drug use: No   Sexual activity: Yes    Birth control/protection: Post-menopausal  Other Topics Concern   Not on file  Social History Narrative   Ms. Chambers is a divorced [place] American female who works as a Teacher, early years/pre who has a number of specialists but no primary care physician. She lived in [place]  [place]  and in [place] for a number of years her mom is from [place]. 16 years of education went to college at [employer] lives at home with her son who is in his 27s no pets   Neg ets tob etoh hx PA   6 hours of sleep   G2P2     TD2010  colonoscopy 2009   Now running  own business    HH of 2    Left handed    Caffeine use: tea sometimes         Social Drivers of Corporate investment banker Strain: Not on file  Food Insecurity: Not on file  Transportation Needs: Not on file  Physical Activity: Not on file  Stress: Not on file  Social Connections: Not on file  Intimate Partner Violence: Not on file    Family History:   Family History  Problem Relation Age of Onset   Heart disease Father        died Mi 44   Diabetes Maternal Grandmother    Hypertension Maternal Grandfather    Diabetes Maternal Grandfather    Heart disease Maternal Grandfather    Colon cancer Maternal Grandfather    Rectal cancer Maternal Grandfather    Prostate cancer Neg Hx    Breast cancer Neg Hx    Colon polyps Neg Hx    Esophageal cancer Neg Hx      ROS:  Please see the history of present illness.  All other ROS reviewed and negative.     Physical Exam/Data: Vitals:   04/27/24 2000 04/27/24 2342 04/28/24 0401 04/28/24 0827  BP: 113/69 114/69 (!) 100/53 119/68  Pulse:  64 62 63  Resp:  16 15 16   Temp:  98.1 F (36.7 C) 98.3 F (36.8 C) 97.9 F (36.6 C)  TempSrc:  Oral Oral Oral  SpO2:  100% 98% 100%  Weight:      Height:        Intake/Output Summary (Last 24 hours) at 04/28/2024 1005 Last data filed at 04/27/2024 1422 Gross per 24 hour  Intake 1000 ml  Output --  Net 1000 ml      04/27/2024    7:29 PM 04/27/2024   10:19 AM 01/27/2024    3:32 PM  Last 3 Weights  Weight (lbs) 194 lb 9.6 oz 183 lb 191 lb  Weight (kg) 88.27 kg 83.008 kg 86.637 kg     Body mass index is 31.41 kg/m.  General: Well developed, well nourished AAF, in no acute distress.  Head: Normocephalic, atraumatic, sclera non-icteric, no xanthomas, nares are without discharge. Neck: Negative for carotid bruits. JVP not elevated. Lungs: Clear bilaterally to auscultation without wheezes, rales, or rhonchi. Breathing is unlabored. Heart: RRR S1  S2 without murmurs, rubs, or gallops.  Abdomen: Soft, non-tender, non-distended with normoactive bowel sounds. No rebound/guarding. Extremities: No clubbing or cyanosis. No edema. Distal pedal pulses are 2+ and equal bilaterally. Neuro: Alert and oriented X 3. Moves all extremities spontaneously. Psych:  Responds to questions appropriately with a normal affect.   EKG:  The EKG was personally reviewed and demonstrates:  NSR 92bpm, TW changes inferiorly and V3-V6, QTc wnl  Telemetry:  Telemetry was personally reviewed and demonstrates:  NSR, brief SVT with gradual onset/offset  Relevant CV Studies: 2d echo 01/2023   1. Left ventricular ejection fraction, by estimation, is 55 to 60%. Left  ventricular ejection fraction by 3D volume is 58 %. The left ventricle has  normal function. The left ventricle has no regional wall motion  abnormalities. Left ventricular diastolic   parameters are indeterminate.   2. Right ventricular systolic function is normal. The right ventricular  size is mildly enlarged. There is normal pulmonary artery systolic  pressure.   3. The mitral valve is degenerative. Trivial mitral valve regurgitation.  No evidence of mitral stenosis.   4. The aortic valve is tricuspid. Aortic valve regurgitation is not  visualized. Aortic valve sclerosis is present, with no evidence of aortic  valve stenosis.   5. The inferior vena cava is normal in size with greater than 50%  respiratory variability, suggesting right atrial pressure of 3 mmHg.   Laboratory Data: High Sensitivity Troponin:   Recent Labs  Lab 04/27/24 1040 04/27/24 1218 04/28/24 0837  TROPONINIHS 23* 81* 26*     Chemistry Recent Labs  Lab 04/27/24 1040 04/27/24 1613 04/28/24 0423  NA 142  --  141  K 3.5  --  3.9  CL 111  --  109  CO2 22  --  24  GLUCOSE 123*  --  94  BUN 8  --  11  CREATININE 0.92 0.77 0.79  CALCIUM  8.7*  --  8.7*  MG 1.8  --  1.9  GFRNONAA >60 >60 >60  ANIONGAP 9  --  8     Recent Labs  Lab 04/27/24 1040 04/28/24 0423  PROT 6.4* 5.8*  ALBUMIN 3.5 3.2*  AST 24 18  ALT 18 17  ALKPHOS 53 46  BILITOT 0.7 0.3   Lipids No results for input(s): CHOL, TRIG, HDL, LABVLDL, LDLCALC, CHOLHDL in the last 168 hours.  Hematology Recent Labs  Lab 04/27/24 1040 04/27/24 1613 04/28/24 0423  WBC 4.4 4.8 6.3  RBC 5.20* 5.12* 4.90  HGB 12.5 12.0 11.6*  HCT 39.5 38.6 37.3  MCV 76.0* 75.4* 76.1*  MCH 24.0* 23.4* 23.7*  MCHC 31.6 31.1 31.1  RDW 14.5 14.4 14.6  PLT 203 200 199   Thyroid  No results for input(s): TSH, FREET4 in the last 168 hours.  BNPNo results for input(s): BNP, PROBNP in the last 168 hours.  DDimer  Recent Labs  Lab 04/27/24 1040  DDIMER <0.27    Radiology/Studies:  DG Chest Portable 1 View Result Date: 04/27/2024 CLINICAL DATA:  Chest pain EXAM: PORTABLE CHEST 1 VIEW COMPARISON:  06/30/2022. FINDINGS: The heart size and mediastinal contours are within normal limits. No focal consolidation, pleural effusion, or pneumothorax. No acute osseous abnormality. IMPRESSION: No acute cardiopulmonary findings. Electronically Signed   By: Harrietta Marcelino HERO.D.  On: 04/27/2024 10:47     Assessment and Plan:  1. Dizziness, palpitations, remote hx PSVT, more recently rare PACs/PVCs, 5 beat NSVT on 2024 monitor - recent monitors in 2024 suggest she is most symptomatic with her rare PVCs, on Toprol  25mg  BID at home - this admission, episode of dizziness was very transient x15 sec without palpitations, but has since had recurrent palpitations while on telemetry with only brief episodes of transient sinus tachycardia with gradual onset/offset without provocation. She also describes episodes of palpitations that sometimes wake her from sleep, though sleep study 2024 was negative for OSA - the sinus tach and elevated BP upon EMS arrival seem suggestive of stress response - agree with echocardiogram - will check TSH - can consider  outpatient urinary 5HIAA, catecholamines, metanephrines to exclude metabolic cause for paroxysms of tachycardia (remotely mentioned as well) - await input re: titration of BB per MD, hold losartan  for now to allow for BP room  2. Elevated troponin of unclear significance, abnormal EKG with diffuse TWI - etiology unclear, doubt ACS given historical normal coronaries (cath 2007, 2009, cor CT 02/2023) - question related to stress-induced versus transient arrhythmia - would not expect sinus tach to provoke this - echo is pending, question whether she needs consideration of cMRI as outpatient  3. Borderline HTN - hold losartan  for now given dizziness to allow for increased BB titration if MD agrees  4. History of hypomagnesemia, hypokalemia - goal K 4.0 or greater, Mg 2.0 or greater - will give 10meq KCl + 1g Mag Sulfate now - start KCl 10meq daily starting tomorrow - takes OTC mag oxide 400mg  daily at home, will increase home dose to 400mg  BID - can f/u as outpatient   Risk Assessment/Risk Scores:  For questions or updates, please contact [facility] Please consult [email or web address] for contact info under    Signed, Dalaysia Harms N Shardae Kleinman, PA-C  04/28/2024 10:05 AM

## 2024-04-28 NOTE — Progress Notes (Signed)
 PROGRESS NOTE    [name]  FMW:[id number] DOB: 1961-02-10 DOA: 04/27/2024 PCP: Purcell Emil Schanz, MD    Brief Narrative:  This 63 y.o. female with medical history significant of essential hypertension, anxiety, depression, GERD presented in the ED with lightheadedness. Patient reports she was going to the work today,  on the way to the work she felt severely lightheaded, felt like she is about to pass out.  She has made  stop at her neighbor's house and called the EMS.  Patient denies any chest pain, palpitations, shortness of breath.  When EMS arrived she was hypertensive and tachycardic,  her heart rate was 150.  Which spontaneously improved to 70s and 80s.  Patient was brought in the ED.  Patient  still reports lightheadedness but denies any chest pain or palpitations.  She denies any recent travel or, sick contacts. She reports having similar episode in 2023 where echocardiogram was done which was unremarkable.  Cardiology was consulted.  Assessment & Plan:   Principal Problem:   Postural dizziness with presyncope Active Problems:   Sleep apnea   Multinodular goiter   History of PSVT (paroxysmal supraventricular tachycardia)   Primary hypertension   Pre- Syncope: History of paroxysmal SVT: PACs/PVCs. Patient presented with severe lightheadedness while going towards the work. She denies any chest pain, chest pain, shortness of breath. EKG > normal sinus rhythm. Troponin slightly trending up 23 >81 >26. Continue aspirin , metoprolol . Hold Cozaar  Obtain TSH, lipid profile. Cardiology is consulted.  Awaiting recommendation. D-dimer normal,  less likely PE or acute DVT. Obtain 2D echocardiogram.   Elevated troponin: Could be due to demand ischemia,  Patient denies any chest pain, palpitations, shortness of breath. Continue to trend troponin and start heparin  if continues to remain elevated. Obtain 2D echocardiogram Follow-up cardiology   Essential  hypertension: Continue metoprolol  and losartan .   Depression/anxiety: Continue Paxil .   GERD: Continue pantoprazole .     DVT prophylaxis:Heparin  sq Code Status:Full code Family Communication:No family at bed side Disposition Plan:  Status is: Observation The patient remains OBS appropriate and will d/c before 2 midnights.   Admitted for presyncopal episode,  cardiology is consulted , Echo is ordered.  Consultants:  Cardiology  Procedures: Echocardiogram  Antimicrobials: None  Subjective: Patient was seen and examined at bedside.  Overnight events noted. Patient denies any chest pain, shortness of breath but reports intermittent palpitations and dizzy spells.  Objective: Vitals:   04/27/24 2342 04/28/24 0401 04/28/24 0827 04/28/24 1118  BP: 114/69 (!) 100/53 119/68 126/84  Pulse: 64 62 63 71  Resp: 16 15 16    Temp: 98.1 F (36.7 C) 98.3 F (36.8 C) 97.9 F (36.6 C)   TempSrc: Oral Oral Oral   SpO2: 100% 98% 100%   Weight:      Height:        Intake/Output Summary (Last 24 hours) at 04/28/2024 1134 Last data filed at 04/27/2024 1422 Gross per 24 hour  Intake 1000 ml  Output --  Net 1000 ml   Filed Weights   04/27/24 1019 04/27/24 1929  Weight: 83 kg 88.3 kg    Examination:  General exam: Appears calm and comfortable, not in any acute distress. Respiratory system: Clear to auscultation. Respiratory effort normal.  RR 15 Cardiovascular system: S1 & S2 heard, RRR. No JVD, murmurs, rubs, gallops or clicks. No pedal edema. Gastrointestinal system: Abdomen is non distended, soft and non tender.  Normal bowel sounds heard. Central nervous system: Alert and oriented x 3. No focal  neurological deficits. Extremities: No edema, no cyanosis, no clubbing Skin: No rashes, lesions or ulcers Psychiatry: Judgement and insight appear normal. Mood & affect appropriate.   Data Reviewed: I have personally reviewed following labs and imaging studies  CBC: Recent Labs   Lab 04/27/24 1040 04/27/24 1613 04/28/24 0423  WBC 4.4 4.8 6.3  HGB 12.5 12.0 11.6*  HCT 39.5 38.6 37.3  MCV 76.0* 75.4* 76.1*  PLT 203 200 199   Basic Metabolic Panel: Recent Labs  Lab 04/27/24 1040 04/27/24 1613 04/28/24 0423  NA 142  --  141  K 3.5  --  3.9  CL 111  --  109  CO2 22  --  24  GLUCOSE 123*  --  94  BUN 8  --  11  CREATININE 0.92 0.77 0.79  CALCIUM  8.7*  --  8.7*  MG 1.8  --  1.9  PHOS  --   --  3.8   GFR: Estimated Creatinine Clearance: 79.5 mL/min (by C-G formula based on SCr of 0.79 mg/dL). Liver Function Tests: Recent Labs  Lab 04/27/24 1040 04/28/24 0423  AST 24 18  ALT 18 17  ALKPHOS 53 46  BILITOT 0.7 0.3  PROT 6.4* 5.8*  ALBUMIN 3.5 3.2*   No results for input(s): LIPASE, AMYLASE in the last 168 hours. No results for input(s): AMMONIA in the last 168 hours. Coagulation Profile: No results for input(s): INR, PROTIME in the last 168 hours. Cardiac Enzymes: No results for input(s): CKTOTAL, CKMB, CKMBINDEX, TROPONINI in the last 168 hours. BNP (last 3 results) No results for input(s): PROBNP in the last 8760 hours. HbA1C: No results for input(s): HGBA1C in the last 72 hours. CBG: No results for input(s): GLUCAP in the last 168 hours. Lipid Profile: No results for input(s): CHOL, HDL, LDLCALC, TRIG, CHOLHDL, LDLDIRECT in the last 72 hours. Thyroid  Function Tests: No results for input(s): TSH, T4TOTAL, FREET4, T3FREE, THYROIDAB in the last 72 hours. Anemia Panel: No results for input(s): VITAMINB12, FOLATE, FERRITIN, TIBC, IRON, RETICCTPCT in the last 72 hours. Sepsis Labs: No results for input(s): PROCALCITON, LATICACIDVEN in the last 168 hours.  No results found for this or any previous visit (from the past 240 hours).   Radiology Studies: DG Chest Portable 1 View Result Date: 04/27/2024 CLINICAL DATA:  Chest pain EXAM: PORTABLE CHEST 1 VIEW COMPARISON:  06/30/2022.  FINDINGS: The heart size and mediastinal contours are within normal limits. No focal consolidation, pleural effusion, or pneumothorax. No acute osseous abnormality. IMPRESSION: No acute cardiopulmonary findings. Electronically Signed   By: Harrietta Sherry M.D.   On: 04/27/2024 10:47   Scheduled Meds:  aspirin  EC  81 mg Oral Daily   docusate sodium   100 mg Oral BID   heparin   5,000 Units Subcutaneous Q8H   [START ON 04/29/2024] magnesium  oxide  400 mg Oral BID   metoprolol  succinate  25 mg Oral BID   pantoprazole   40 mg Oral Daily   PARoxetine   25 mg Oral Daily   [START ON 04/29/2024] potassium chloride   10 mEq Oral Daily   Continuous Infusions:  sodium chloride  50 mL/hr at 04/27/24 1627   magnesium  sulfate bolus IVPB       LOS: 0 days    Time spent: 35 mins    Darcel Dawley, MD [facility]   If 7PM-7AM, please contact night-coverage

## 2024-04-28 NOTE — Plan of Care (Signed)
   Problem: Education: Goal: Knowledge of General Education information will improve Description Including pain rating scale, medication(s)/side effects and non-pharmacologic comfort measures Outcome: Progressing

## 2024-04-28 NOTE — Progress Notes (Signed)
  Echocardiogram 2D Echocardiogram has been performed.  [name] 04/28/2024, 10:58 AM

## 2024-04-29 DIAGNOSIS — R55 Syncope and collapse: Secondary | ICD-10-CM | POA: Diagnosis not present

## 2024-04-29 DIAGNOSIS — R42 Dizziness and giddiness: Secondary | ICD-10-CM | POA: Diagnosis not present

## 2024-04-29 NOTE — Discharge Summary (Signed)
 Physician Discharge Summary  [name] FMW:[id number] DOB: [birth year] DOA: 04/27/2024  PCP: Danielle Emil Schanz, MD  Admit date: 04/27/2024  Discharge date: 04/29/2024  Admitted From: Home  Disposition:  Home  Recommendations for Outpatient Follow-up:  Follow up with PCP in 1-2 weeks. Please obtain BMP/CBC in one week. Advised to follow-up with Cardiology for outpatient loop recorder monitoring.  Home Health:None Equipment/Devices:None  Discharge Condition: Stable CODE STATUS:Full code Diet recommendation: Heart Healthy   Brief [name] Course: This 63 y.o. female with medical history significant of essential hypertension, anxiety, depression, GERD presented in the ED with lightheadedness. Patient reports she was going to the work today,  on the way to the work she felt severely lightheaded, felt like she is about to pass out.  She has made  stop at her neighbor's house and called the EMS.  Patient denies any chest pain, palpitations, shortness of breath.  When EMS arrived she was hypertensive and tachycardic,  her heart rate was 150.  Which spontaneously improved to 70s and 80s.  Patient was brought in the ED.  Patient  still reports lightheadedness but denies any chest pain or palpitations.  She denies any recent travel or, sick contacts. She reports having similar episode in 2023 where echocardiogram was done which was unremarkable.  Cardiology was consulted.  Troponin trended down.  Does not look like ACS.  Patient has resolution of symptoms.  Cardiology recommended outpatient follow-up,  outpatient loop recorder monitoring.  Patient wants to be discharged home.   Discharge Diagnoses:  Principal Problem:   Postural dizziness with presyncope Active Problems:   Sleep apnea   Multinodular goiter   History of PSVT (paroxysmal supraventricular tachycardia)   Primary hypertension  Pre- Syncope: History of paroxysmal SVT : PACs / PVCs. Patient presented with  severe lightheadedness while going towards the work. She denies any chest pain, chest pain, shortness of breath. EKG > normal sinus rhythm. Troponin slightly trending up 23 >81 >26. Continue aspirin , metoprolol . Hold Cozaar  Cardiology is consulted.  Awaiting recommendation. D-dimer normal,  less likely PE or acute DVT. Echocardiogram unremarkable. Cardiology signed off , recommended outpatient loop recorder monitoring.   Elevated troponin: Could be due to demand ischemia,  Patient denies any chest pain, palpitations, shortness of breath. Continue to trend troponin and start heparin  if continues to remain elevated. Obtain 2D echocardiogram > unremarkable. Follow-up cardiology > outpatient loop recorder monitoring.   Essential hypertension: Continue metoprolol  and losartan .   Depression/anxiety: Continue Paxil .   GERD: Continue pantoprazole .  Discharge Instructions  Discharge Instructions     Call MD for:  difficulty breathing, headache or visual disturbances   Complete by: As directed    Call MD for:  persistant dizziness or light-headedness   Complete by: As directed    Call MD for:  persistant nausea and vomiting   Complete by: As directed    Diet - low sodium heart healthy   Complete by: As directed    Diet general   Complete by: As directed    Discharge instructions   Complete by: As directed    Advised to follow-up with primary care physician in 1 week. Advised to follow-up with cardiology for outpatient loop recorder monitoring.   Increase activity slowly   Complete by: As directed       Allergies as of 04/29/2024       Reactions   Doxycycline  Rash        Medication List     STOP taking these medications  amphetamine-dextroamphetamine 5 MG tablet Commonly known as: ADDERALL       TAKE these medications    ALPRAZolam  0.5 MG tablet Commonly known as: XANAX  Take 0.5 mg by mouth at bedtime as needed for anxiety. For MRI   aspirin  EC 81 MG  tablet Take 1 tablet (81 mg total) by mouth daily.   cholecalciferol 25 MCG (1000 UNIT) tablet Commonly known as: VITAMIN D3 Take 3,000 Units by mouth in the morning.   cyclobenzaprine  5 MG tablet Commonly known as: FLEXERIL  Take 1 tablet (5 mg total) by mouth at bedtime as needed for muscle spasms (low back pain).   losartan  25 MG tablet Commonly known as: COZAAR  Take 25 mg by mouth at bedtime as needed.   magnesium  oxide 400 (240 Mg) MG tablet Commonly known as: MAG-OX Take 400 mg by mouth daily.   metoprolol  succinate 25 MG 24 hr tablet Commonly known as: TOPROL -XL TAKE 1 TABLET (25 MG) BY MOUTH IN THE MORNING AND AT BEDTIME What changed: See the new instructions.   OYSTER SHELL CALCIUM  250+D PO Take 1 tablet by mouth at bedtime.   pantoprazole  40 MG tablet Commonly known as: Protonix  Take 1 tablet (40 mg total) by mouth daily. What changed:  when to take this reasons to take this   PARoxetine  25 MG 24 hr tablet Commonly known as: PAXIL -CR Take 25 mg by mouth daily.   potassium chloride  SA 20 MEQ tablet Commonly known as: KLOR-CON  M TAKE 2 TABLETS (40 MEQ TOTAL) BY MOUTH DAILY. What changed: See the new instructions.   TURMERIC PO Take 1 capsule by mouth daily as needed (pain/inflammation).   VITAMIN B 12 PO Take 1,000 mcg by mouth in the morning.   vitamin C 1000 MG tablet Take 2,000 mg by mouth at bedtime.        Follow-up Information     Janene Boer, [place] Follow up.   Specialties: Cardiology, Radiology Why: [name] - cardiology follow-up arranged Thursday May 17, 2024 at 8:50 AM (Arrive by 8:30 AM) Contact information: [address] [place] [address] [phone]         Danielle Emil Schanz, MD Follow up.   Specialty: Internal Medicine Contact information: [address] [place] [address] [phone]                Allergies  Allergen Reactions   Doxycycline  Rash     Consultations: Cardiology   Procedures/Studies: ECHOCARDIOGRAM COMPLETE Result Date: 04/28/2024    ECHOCARDIOGRAM REPORT   Patient Name:   [name] Date of Exam: 04/28/2024 Medical Rec #:  [id number]              Height:       66.0 in Accession #:    [id number]             Weight:       194.6 lb Date of Birth:  December 23, 1960              BSA:          1.977 m Patient Age:    62 years               BP:           100/53 mmHg Patient Gender: F                      HR:           72 bpm. Exam Location:  Inpatient Procedure: 2D Echo, Cardiac Doppler and Color Doppler (Both Spectral and Color            Flow Doppler were utilized during procedure). Indications:    R55 Syncope  History:        Patient has prior history of Echocardiogram examinations, most                 recent 01/24/2023. Abnormal ECG, Arrythmias:SVT,                 Signs/Symptoms:Dizziness/Lightheadedness and Murmur; Risk                 Factors:Sleep Apnea and Hypertension.  Sonographer:    Ellouise Mose RDCS Referring Phys: [id number] [facility]  Sonographer Comments: Image acquisition challenging due to respiratory motion. Some images repeated after consult during study. IMPRESSIONS  1. Left ventricular ejection fraction, by estimation, is 60 to 65%. The left ventricle has normal function. The left ventricle has no regional wall motion abnormalities. Left ventricular diastolic parameters were normal.  2. Right ventricular systolic function is normal. The right ventricular size is normal. There is normal pulmonary artery systolic pressure. The estimated right ventricular systolic pressure is 26.4 mmHg.  3. The mitral valve is grossly normal. Trivial mitral valve regurgitation. No evidence of mitral stenosis.  4. The aortic valve is tricuspid. Aortic valve regurgitation is not visualized. No aortic stenosis is present.  5. The inferior vena cava is normal in size with greater than 50% respiratory variability, suggesting right atrial  pressure of 3 mmHg. Conclusion(s)/Recommendation(s): Normal biventricular function without evidence of hemodynamically significant valvular heart disease. FINDINGS  Left Ventricle: Left ventricular ejection fraction, by estimation, is 60 to 65%. The left ventricle has normal function. The left ventricle has no regional wall motion abnormalities. The left ventricular internal cavity size was normal in size. There is  no left ventricular hypertrophy. Left ventricular diastolic parameters were normal. Right Ventricle: The right ventricular size is normal. No increase in right ventricular wall thickness. Right ventricular systolic function is normal. There is normal pulmonary artery systolic pressure. The tricuspid regurgitant velocity is 2.42 m/s, and  with an assumed right atrial pressure of 3 mmHg, the estimated right ventricular systolic pressure is 26.4 mmHg. Left Atrium: Left atrial size was normal in size. Right Atrium: Right atrial size was normal in size. Pericardium: Trivial pericardial effusion is present. Mitral Valve: The mitral valve is grossly normal. Trivial mitral valve regurgitation. No evidence of mitral valve stenosis. Tricuspid Valve: The tricuspid valve is grossly normal. Tricuspid valve regurgitation is trivial. No evidence of tricuspid stenosis. Aortic Valve: The aortic valve is tricuspid. Aortic valve regurgitation is not visualized. No aortic stenosis is present. Pulmonic Valve: The pulmonic valve was normal in structure. Pulmonic valve regurgitation is trivial. No evidence of pulmonic stenosis. Aorta: The aortic root and ascending aorta are structurally normal, with no evidence of dilitation. Venous: The inferior vena cava is normal in size with greater than 50% respiratory variability, suggesting right atrial pressure of 3 mmHg. IAS/Shunts: The atrial septum is grossly normal.  LEFT VENTRICLE PLAX 2D LVIDd:         4.03 cm     Diastology LVIDs:         2.64 cm     LV e' medial:    8.70 cm/s  LV PW:         1.39 cm     LV E/e' medial:  10.9 LV IVS:  0.94 cm     LV e' lateral:   11.30 cm/s LVOT diam:     1.88 cm     LV E/e' lateral: 8.4 LV SV:         67 LV SV Index:   34 LVOT Area:     2.78 cm  LV Volumes (MOD) LV vol d, MOD A2C: 67.2 ml LV vol d, MOD A4C: 72.3 ml LV vol s, MOD A2C: 21.5 ml LV vol s, MOD A4C: 25.4 ml LV SV MOD A2C:     45.7 ml LV SV MOD A4C:     72.3 ml LV SV MOD BP:      46.2 ml RIGHT VENTRICLE             IVC RV S prime:     13.70 cm/s  IVC diam: 1.32 cm TAPSE (M-mode): 2.4 cm LEFT ATRIUM             Index        RIGHT ATRIUM           Index LA diam:        2.86 cm 1.45 cm/m   RA Area:     16.50 cm LA Vol (A2C):   34.7 ml 17.55 ml/m  RA Volume:   48.30 ml  24.43 ml/m LA Vol (A4C):   31.7 ml 16.04 ml/m LA Biplane Vol: 36.0 ml 18.21 ml/m  AORTIC VALVE LVOT Vmax:   118.00 cm/s LVOT Vmean:  79.200 cm/s LVOT VTI:    0.241 m  AORTA Ao Root diam: 2.94 cm Ao Asc diam:  2.97 cm MITRAL VALVE                TRICUSPID VALVE MV Area (PHT): 4.02 cm     TR Peak grad:   23.4 mmHg MV Decel Time: 189 msec     TR Vmax:        242.00 cm/s MV E velocity: 94.85 cm/s MV A velocity: 118.00 cm/s  SHUNTS MV E/A ratio:  0.80         Systemic VTI:  0.24 m                             Systemic Diam: 1.88 cm Darryle Decent MD Electronically signed by Darryle Decent MD Signature Date/Time: 04/28/2024/11:40:10 AM    Final    DG Chest Portable 1 View Result Date: 04/27/2024 CLINICAL DATA:  Chest pain EXAM: PORTABLE CHEST 1 VIEW COMPARISON:  06/30/2022. FINDINGS: The heart size and mediastinal contours are within normal limits. No focal consolidation, pleural effusion, or pneumothorax. No acute osseous abnormality. IMPRESSION: No acute cardiopulmonary findings. Electronically Signed   By: Harrietta Sherry M.D.   On: 04/27/2024 10:47     Subjective: Patient was seen and examined at bedside.  Overnight events noted. Patient reports feeling much improved,  denies any symptoms.  Wants to be discharged  home.  Discharge Exam: Vitals:   04/29/24 0749 04/29/24 0901  BP: 118/60   Pulse: (!) 52 61  Resp: 18   Temp: 98.1 F (36.7 C)   SpO2: 99%    Vitals:   04/28/24 2307 04/29/24 0354 04/29/24 0749 04/29/24 0901  BP: 116/74 110/69 118/60   Pulse: 61 71 (!) 52 61  Resp: 18 20 18    Temp: 97.8 F (36.6 C) 97.9 F (36.6 C) 98.1 F (36.7 C)   TempSrc: Oral Oral Oral   SpO2: 99% 100% 99%  Weight:      Height:        General: Pt is alert, awake, not in acute distress Cardiovascular: RRR, S1/S2 +, no rubs, no gallops Respiratory: CTA bilaterally, no wheezing, no rhonchi Abdominal: Soft, NT, ND, bowel sounds + Extremities: no edema, no cyanosis    The results of significant diagnostics from this hospitalization (including imaging, microbiology, ancillary and laboratory) are listed below for reference.     Microbiology: No results found for this or any previous visit (from the past 240 hours).   Labs: BNP (last 3 results) No results for input(s): BNP in the last 8760 hours. Basic Metabolic Panel: Recent Labs  Lab 04/27/24 1040 04/27/24 1613 04/28/24 0423  NA 142  --  141  K 3.5  --  3.9  CL 111  --  109  CO2 22  --  24  GLUCOSE 123*  --  94  BUN 8  --  11  CREATININE 0.92 0.77 0.79  CALCIUM  8.7*  --  8.7*  MG 1.8  --  1.9  PHOS  --   --  3.8   Liver Function Tests: Recent Labs  Lab 04/27/24 1040 04/28/24 0423  AST 24 18  ALT 18 17  ALKPHOS 53 46  BILITOT 0.7 0.3  PROT 6.4* 5.8*  ALBUMIN 3.5 3.2*   No results for input(s): LIPASE, AMYLASE in the last 168 hours. No results for input(s): AMMONIA in the last 168 hours. CBC: Recent Labs  Lab 04/27/24 1040 04/27/24 1613 04/28/24 0423  WBC 4.4 4.8 6.3  HGB 12.5 12.0 11.6*  HCT 39.5 38.6 37.3  MCV 76.0* 75.4* 76.1*  PLT 203 200 199   Cardiac Enzymes: No results for input(s): CKTOTAL, CKMB, CKMBINDEX, TROPONINI in the last 168 hours. BNP: Invalid input(s): POCBNP CBG: No results  for input(s): GLUCAP in the last 168 hours. D-Dimer Recent Labs    04/27/24 1040  DDIMER <0.27   Hgb A1c No results for input(s): HGBA1C in the last 72 hours. Lipid Profile No results for input(s): CHOL, HDL, LDLCALC, TRIG, CHOLHDL, LDLDIRECT in the last 72 hours. Thyroid  function studies Recent Labs    04/28/24 0423  TSH 1.371   Anemia work up No results for input(s): VITAMINB12, FOLATE, FERRITIN, TIBC, IRON, RETICCTPCT in the last 72 hours. Urinalysis    Component Value Date/Time   COLORURINE YELLOW 04/27/2024 2121   APPEARANCEUR CLEAR 04/27/2024 2121   LABSPEC 1.014 04/27/2024 2121   PHURINE 8.0 04/27/2024 2121   GLUCOSEU NEGATIVE 04/27/2024 2121   GLUCOSEU NEGATIVE 12/15/2023 1459   HGBUR NEGATIVE 04/27/2024 2121   BILIRUBINUR NEGATIVE 04/27/2024 2121   BILIRUBINUR positive 02/04/2023 1334   KETONESUR NEGATIVE 04/27/2024 2121   PROTEINUR NEGATIVE 04/27/2024 2121   UROBILINOGEN 0.2 12/15/2023 1459   NITRITE NEGATIVE 04/27/2024 2121   LEUKOCYTESUR LARGE (A) 04/27/2024 2121   Sepsis Labs Recent Labs  Lab 04/27/24 1040 04/27/24 1613 04/28/24 0423  WBC 4.4 4.8 6.3   Microbiology No results found for this or any previous visit (from the past 240 hours).   Time coordinating discharge: Over 30 minutes  SIGNED:   Darcel Dawley, MD  [facility] 04/29/2024, 1:27 PM Pager   If 7PM-7AM, please contact night-coverage

## 2024-04-29 NOTE — Plan of Care (Signed)
  Problem: Education: Goal: Knowledge of General Education information will improve Description: Including pain rating scale, medication(s)/side effects and non-pharmacologic comfort measures Outcome: Completed/Met   Problem: Health Behavior/Discharge Planning: Goal: Ability to manage health-related needs will improve Outcome: Completed/Met   Problem: Activity: Goal: Risk for activity intolerance will decrease Outcome: Adequate for Discharge   Problem: Nutrition: Goal: Adequate nutrition will be maintained Outcome: Completed/Met   Problem: Coping: Goal: Level of anxiety will decrease Outcome: Completed/Met   Problem: Elimination: Goal: Will not experience complications related to bowel motility Outcome: Completed/Met Goal: Will not experience complications related to urinary retention Outcome: Completed/Met

## 2024-04-29 NOTE — Discharge Instructions (Signed)
 Advised to follow-up with primary care physician in 1 week. Advised to follow-up with cardiology for outpatient loop recorder monitoring.

## 2024-04-29 NOTE — Plan of Care (Signed)
  Problem: Education: Goal: Knowledge of General Education information will improve Description: Including pain rating scale, medication(s)/side effects and non-pharmacologic comfort measures Outcome: Completed/Met   Problem: Health Behavior/Discharge Planning: Goal: Ability to manage health-related needs will improve Outcome: Completed/Met   Problem: Clinical Measurements: Goal: Ability to maintain clinical measurements within normal limits will improve Outcome: Completed/Met Goal: Will remain free from infection Outcome: Completed/Met Goal: Diagnostic test results will improve Outcome: Completed/Met Goal: Respiratory complications will improve Outcome: Completed/Met Goal: Cardiovascular complication will be avoided 04/29/2024 1148 by Harvey Tresia SAUNDERS, RN Outcome: Completed/Met 04/29/2024 1147 by Harvey Tresia SAUNDERS, RN Outcome: Adequate for Discharge   Problem: Activity: Goal: Risk for activity intolerance will decrease 04/29/2024 1148 by Harvey Tresia SAUNDERS, RN Outcome: Completed/Met 04/29/2024 1147 by Harvey Tresia SAUNDERS, RN Outcome: Adequate for Discharge   Problem: Coping: Goal: Level of anxiety will decrease Outcome: Completed/Met   Problem: Elimination: Goal: Will not experience complications related to bowel motility Outcome: Completed/Met Goal: Will not experience complications related to urinary retention Outcome: Completed/Met   Problem: Pain Managment: Goal: General experience of comfort will improve and/or be controlled Outcome: Completed/Met   Problem: Safety: Goal: Ability to remain free from injury will improve Outcome: Completed/Met   Problem: Skin Integrity: Goal: Risk for impaired skin integrity will decrease Outcome: Completed/Met

## 2024-04-30 ENCOUNTER — Telehealth: Payer: Self-pay

## 2024-04-30 NOTE — Transitions of Care (Post Inpatient/ED Visit) (Unsigned)
   04/30/2024  Name: Michalina Calbert MRN: [id number] DOB: 08-Dec-1960  Today's TOC FU Call Status: Today's TOC FU Call Status:: Unsuccessful Call (1st Attempt) Unsuccessful Call (1st Attempt) Date: 04/30/24  Attempted to reach the patient regarding the most recent Inpatient/ED visit.  Follow Up Plan: Additional outreach attempts will be made to reach the patient to complete the Transitions of Care (Post Inpatient/ED visit) call.   Signature Julian Lemmings, LPN [facility] Nurse Health Advisor Direct Dial [phone]

## 2024-05-01 ENCOUNTER — Ambulatory Visit (INDEPENDENT_AMBULATORY_CARE_PROVIDER_SITE_OTHER): Admitting: Emergency Medicine

## 2024-05-01 ENCOUNTER — Encounter: Payer: Self-pay | Admitting: Emergency Medicine

## 2024-05-01 VITALS — BP 102/68 | HR 66 | Temp 98.0°F | Ht 66.0 in | Wt 192.0 lb

## 2024-05-01 DIAGNOSIS — R42 Dizziness and giddiness: Secondary | ICD-10-CM | POA: Diagnosis not present

## 2024-05-01 DIAGNOSIS — R7303 Prediabetes: Secondary | ICD-10-CM

## 2024-05-01 DIAGNOSIS — I10 Essential (primary) hypertension: Secondary | ICD-10-CM

## 2024-05-01 DIAGNOSIS — I471 Supraventricular tachycardia, unspecified: Secondary | ICD-10-CM

## 2024-05-01 DIAGNOSIS — R55 Syncope and collapse: Secondary | ICD-10-CM

## 2024-05-01 DIAGNOSIS — Z09 Encounter for follow-up examination after completed treatment for conditions other than malignant neoplasm: Secondary | ICD-10-CM | POA: Diagnosis not present

## 2024-05-01 DIAGNOSIS — E042 Nontoxic multinodular goiter: Secondary | ICD-10-CM

## 2024-05-01 NOTE — Assessment & Plan Note (Signed)
Clinically stable. Diet and nutrition discussed.

## 2024-05-01 NOTE — Progress Notes (Signed)
 [name] 63 y.o.   Chief Complaint  Patient presents with   Hospitalization Follow-up    Patient here for HFU. Was in the ED 04/27/24 for syncope. Patient is having issues with her bowel movements, states its every 4-5 days when she goes and its not much, she says she has tried prune juice and other otc to help move her bowels. She states she feels fine today.     HISTORY OF PRESENT ILLNESS: This is a 63 y.o. female here for hospital discharge follow-up Overall feeling well.  Has no complaints or medical concerns today.  Feeling much improved. Hospital discharge summary as follows:  Physician Discharge Summary  Lizzeth Meder FMW:[id number] DOB: [birth year] DOA: 04/27/2024   PCP: Purcell Emil Schanz, MD   Admit date: 04/27/2024   Discharge date: 04/29/2024   Admitted From: Home   Disposition:  Home   Recommendations for Outpatient Follow-up:  Follow up with PCP in 1-2 weeks. Please obtain BMP/CBC in one week. Advised to follow-up with Cardiology for outpatient loop recorder monitoring.   Home Health:None Equipment/Devices:None   Discharge Condition: Stable CODE STATUS:Full code Diet recommendation: Heart Healthy    Brief [facility] Course: This 63 y.o. female with medical history significant of essential hypertension, anxiety, depression, GERD presented in the ED with lightheadedness. Patient reports she was going to the work today,  on the way to the work she felt severely lightheaded, felt like she is about to pass out.  She has made  stop at her neighbor's house and called the EMS.  Patient denies any chest pain, palpitations, shortness of breath.  When EMS arrived she was hypertensive and tachycardic,  her heart rate was 150.  Which spontaneously improved to 70s and 80s.  Patient was brought in the ED.  Patient  still reports lightheadedness but denies any chest pain or palpitations.  She denies any recent travel or, sick contacts. She reports  having similar episode in 2023 where echocardiogram was done which was unremarkable.  Cardiology was consulted.  Troponin trended down.  Does not look like ACS.  Patient has resolution of symptoms.  Cardiology recommended outpatient follow-up,  outpatient loop recorder monitoring.  Patient wants to be discharged home.     Discharge Diagnoses:  Principal Problem:   Postural dizziness with presyncope Active Problems:   Sleep apnea   Multinodular goiter   History of PSVT (paroxysmal supraventricular tachycardia)   Primary hypertension   Pre- Syncope: History of paroxysmal SVT : PACs / PVCs. Patient presented with severe lightheadedness while going towards the work. She denies any chest pain, chest pain, shortness of breath. EKG > normal sinus rhythm. Troponin slightly trending up 23 >81 >26. Continue aspirin , metoprolol . Hold Cozaar  Cardiology is consulted.  Awaiting recommendation. D-dimer normal,  less likely PE or acute DVT. Echocardiogram unremarkable. Cardiology signed off , recommended outpatient loop recorder monitoring.  Temporary episode.   Elevated troponin: Could be due to demand ischemia,  Patient denies any chest pain, palpitations, shortness of breath. Continue to trend troponin and start heparin  if continues to remain elevated. Obtain 2D echocardiogram > unremarkable. Follow-up cardiology > outpatient loop recorder monitoring.   Essential hypertension: Continue metoprolol  and losartan .   Depression/anxiety: Continue Paxil .   GERD: Continue pantoprazole . Getting HPI   Prior to Admission medications   Medication Sig Start Date End Date Taking? Authorizing Provider  ALPRAZolam  (XANAX ) 0.5 MG tablet Take 0.5 mg by mouth at bedtime as needed for anxiety. For MRI   Yes [provider]  Ascorbic Acid (VITAMIN C) 1000 MG tablet Take 2,000 mg by mouth at bedtime.   Yes [provider]  Calcium  Carb-Cholecalciferol (OYSTER SHELL CALCIUM  250+D PO)  Take 1 tablet by mouth at bedtime.   Yes [provider]  cholecalciferol (VITAMIN D3) 25 MCG (1000 UNIT) tablet Take 3,000 Units by mouth in the morning.   Yes [provider]  Cyanocobalamin  (VITAMIN B 12 PO) Take 1,000 mcg by mouth in the morning.   Yes [provider]  cyclobenzaprine  (FLEXERIL ) 5 MG tablet Take 1 tablet (5 mg total) by mouth at bedtime as needed for muscle spasms (low back pain). 03/12/24  Yes Patel, Donika K, DO  losartan  (COZAAR ) 25 MG tablet Take 25 mg by mouth at bedtime as needed.   Yes [provider]  magnesium  oxide (MAG-OX) 400 (240 Mg) MG tablet Take 400 mg by mouth daily.   Yes [provider]  PARoxetine  (PAXIL -CR) 25 MG 24 hr tablet Take 25 mg by mouth daily.   Yes [provider]  TURMERIC PO Take 1 capsule by mouth daily as needed (pain/inflammation).   Yes [provider]  aspirin  EC 81 MG EC tablet Take 1 tablet (81 mg total) by mouth daily. Patient not taking: Reported on 05/01/2024 10/21/19   Claudene Pacific, MD  metoprolol  succinate (TOPROL -XL) 25 MG 24 hr tablet TAKE 1 TABLET (25 MG) BY MOUTH IN THE MORNING AND AT BEDTIME Patient not taking: Reported on 05/01/2024 09/09/23   Meng, Hao, PA  pantoprazole  (PROTONIX ) 40 MG tablet Take 1 tablet (40 mg total) by mouth daily. Patient not taking: Reported on 05/01/2024 11/09/23   Albertus Gordy HERO, MD  potassium chloride  SA (KLOR-CON  M) 20 MEQ tablet TAKE 2 TABLETS (40 MEQ TOTAL) BY MOUTH DAILY. Patient not taking: Reported on 05/01/2024 03/21/24   Purcell Emil Schanz, MD    Allergies  Allergen Reactions   Doxycycline  Rash    Patient Active Problem List   Diagnosis Date Noted   Postural dizziness with presyncope 04/27/2024   Right upper quadrant abdominal pain 12/15/2023   Mixed conductive and sensorineural hearing loss of right ear with restricted hearing of left ear 08/15/2023   Hypokalemia 02/27/2023   Primary hypertension 02/27/2023   Family history  of bladder cancer 07/05/2022   Intermittent gross hematuria 07/05/2022   Chronic sinusitis 08/25/2021   Prediabetes 08/25/2021   Paroxysmal SVT (supraventricular tachycardia) (HCC) 03/31/2021   Degenerative disc disease, cervical 10/13/2020   Stress incontinence of urine 10/31/2018   History of diverticulosis 05/04/2017   History of PSVT (paroxysmal supraventricular tachycardia) 06/24/2012   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   Multinodular goiter 05/12/2012   Sleep apnea 01/19/2008   IRRITABLE BOWEL SYNDROME 01/18/2008    Past Medical History:  Diagnosis Date   Anemia    Anxiety    Colon polyp 08/28/2012   Tubular adenoma   Dense breast    Depression    Genital warts    GERD (gastroesophageal reflux disease)    Heart murmur    Hemorrhoids    Hx of cardiac catheterization 2009   normal coronary arteries   Hx of menorrhagia    Novasure   Hypokalemia    Hypomagnesemia    Hypophosphatemia    Lactose intolerance in adult    Meniscus degeneration, right    torn r knee/ 10/2019   Mitral valve prolapse    Positive PPD    PSVT (paroxysmal supraventricular tachycardia) (HCC)    PVC's (premature ventricular contractions)  Sleep apnea    Thyroid  disease    Thyroid  nodules    Past Surgical History:  Procedure Laterality Date   CARDIAC CATHETERIZATION     x2 with normal results per pt   KNEE ARTHROSCOPY W/ MENISCECTOMY Right 08/2023   NOVASURE ABLATION     UPPER GASTROINTESTINAL ENDOSCOPY  01/12/2012    Social History   Socioeconomic History   Marital status: Divorced    Spouse name: Not on file   Number of children: 2   Years of education: 16   Highest education level: Not on file  Occupational History   Occupation: dialysis Theme park manager: [employer]  Tobacco Use   Smoking status: Never   Smokeless tobacco: Never  Vaping Use   Vaping status: Never Used  Substance and Sexual Activity   Alcohol use: No   Drug use: No   Sexual activity: Yes     Birth control/protection: Post-menopausal  Other Topics Concern   Not on file  Social History Narrative   Ms. 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 She lived in [place]  [place]  and in [place] for a number of years her mom is from [place]. 16 years of education went to college at [employer] lives at home with her son who is in his 32s no pets   Neg ets tob etoh hx PA   6 hours of sleep   G2P2    TD2010  colonoscopy 2009   Now running  own business    HH of 2    Left handed    Caffeine use: tea sometimes         Social Drivers of Corporate investment banker Strain: Not on file  Food Insecurity: No Food Insecurity (04/28/2024)   Hunger Vital Sign    Worried About Running Out of Food in the Last Year: Never true    Ran Out of Food in the Last Year: Never true  Transportation Needs: No Transportation Needs (04/28/2024)   PRAPARE - Administrator, Civil Service (Medical): No    Lack of Transportation (Non-Medical): No  Physical Activity: Not on file  Stress: Not on file  Social Connections: Not on file  Intimate Partner Violence: Not At Risk (04/28/2024)   Humiliation, Afraid, Rape, and Kick questionnaire    Fear of Current or Ex-Partner: No    Emotionally Abused: No    Physically Abused: No    Sexually Abused: No    Family History  Problem Relation Age of Onset   Heart disease Father        died Mi 62   Diabetes Maternal Grandmother    Hypertension Maternal Grandfather    Diabetes Maternal Grandfather    Heart disease Maternal Grandfather    Colon cancer Maternal Grandfather    Rectal cancer Maternal Grandfather    CAD Paternal Grandfather        CABG   Prostate cancer Neg Hx    Breast cancer Neg Hx    Colon polyps Neg Hx    Esophageal cancer Neg Hx      Review of Systems  Constitutional: Negative.  Negative for chills and fever.  HENT:  Negative.  Negative for congestion and sore throat.   Respiratory: Negative.  Negative for cough and shortness of breath.   Cardiovascular: Negative.  Negative for chest pain and palpitations.  Gastrointestinal:  Negative for abdominal pain, diarrhea, nausea and vomiting.  Genitourinary: Negative.  Negative for dysuria and hematuria.  Skin: Negative.  Negative for rash.  Neurological: Negative.  Negative for dizziness and headaches.  All other systems reviewed and are negative.   Vitals:   05/01/24 1515  BP: 102/68  Pulse: 66  Temp: 98 F (36.7 C)  SpO2: 98%    Physical Exam Vitals reviewed.  Constitutional:      Appearance: Normal appearance.  HENT:     Head: Normocephalic.     Mouth/Throat:     Mouth: Mucous membranes are moist.     Pharynx: Oropharynx is clear.  Eyes:     Extraocular Movements: Extraocular movements intact.     Conjunctiva/sclera: Conjunctivae normal.     Pupils: Pupils are equal, round, and reactive to light.  Cardiovascular:     Rate and Rhythm: Normal rate and regular rhythm.     Pulses: Normal pulses.     Heart sounds: Normal heart sounds.  Pulmonary:     Effort: Pulmonary effort is normal.     Breath sounds: Normal breath sounds.  Musculoskeletal:     Cervical back: No tenderness.  Lymphadenopathy:     Cervical: No cervical adenopathy.  Skin:    General: Skin is warm and dry.     Capillary Refill: Capillary refill takes less than 2 seconds.  Neurological:     General: No focal deficit present.     Mental Status: She is alert and oriented to person, place, and time.  Psychiatric:        Mood and Affect: Mood normal.        Behavior: Behavior normal.      ASSESSMENT & PLAN: A total of 42 minutes was spent with the patient and counseling/coordination of care regarding preparing for this visit, review of most recent office visit notes, review of most recent hospital discharge summary, review of multiple chronic medical conditions and their  management, review of all medications, review of most recent bloodwork results, review of health maintenance items, education on nutrition, prognosis, documentation, and need for follow up.   Problem List Items Addressed This Visit       Cardiovascular and Mediastinum   Paroxysmal SVT (supraventricular tachycardia) (HCC) - Primary   Clinically stable.  Asymptomatic.  Temporary episode. Was evaluated by cardiology who recommend outpatient loop recorder monitoring.      Primary hypertension   BP Readings from Last 3 Encounters:  05/01/24 102/68  04/29/24 118/60  03/12/24 112/67  Losartan  was discontinued Not taking metoprolol  succinate either       Postural dizziness with presyncope   Much improved.  No concerns.        Endocrine   Multinodular goiter   Clinically euthyroid No history of thyroid  cancer Biopsies negative in the past        Other   Prediabetes   Clinically stable. Diet and nutrition discussed      Other Visit Diagnoses       Hospital discharge follow-up          Patient Instructions  Health Maintenance, Female Adopting a healthy lifestyle and getting preventive care are important in promoting health and wellness. Ask your health care provider about: The right schedule for you to have regular tests and exams. Things you can do on your own to prevent diseases and keep yourself healthy. What should I know about diet, weight, and exercise? Eat a healthy diet  Eat a diet that includes plenty  of vegetables, fruits, low-fat dairy products, and lean protein. Do not eat a lot of foods that are high in solid fats, added sugars, or sodium. Maintain a healthy weight Body mass index (BMI) is used to identify weight problems. It estimates body fat based on height and weight. Your health care provider can help determine your BMI and help you achieve or maintain a healthy weight. Get regular exercise Get regular exercise. This is one of the most important  things you can do for your health. Most adults should: Exercise for at least 150 minutes each week. The exercise should increase your heart rate and make you sweat (moderate-intensity exercise). Do strengthening exercises at least twice a week. This is in addition to the moderate-intensity exercise. Spend less time sitting. Even light physical activity can be beneficial. Watch cholesterol and blood lipids Have your blood tested for lipids and cholesterol at 65 years of age, then have this test every 5 years. Have your cholesterol levels checked more often if: Your lipid or cholesterol levels are high. You are older than 65 years of age. You are at high risk for heart disease. What should I know about cancer screening? Depending on your health history and family history, you may need to have cancer screening at various ages. This may include screening for: Breast cancer. Cervical cancer. Colorectal cancer. Skin cancer. Lung cancer. What should I know about heart disease, diabetes, and high blood pressure? Blood pressure and heart disease High blood pressure causes heart disease and increases the risk of stroke. This is more likely to develop in people who have high blood pressure readings or are overweight. Have your blood pressure checked: Every 3-5 years if you are 29-45 years of age. Every year if you are 41 years old or older. Diabetes Have regular diabetes screenings. This checks your fasting blood sugar level. Have the screening done: Once every three years after age 55 if you are at a normal weight and have a low risk for diabetes. More often and at a younger age if you are overweight or have a high risk for diabetes. What should I know about preventing infection? Hepatitis B If you have a higher risk for hepatitis B, you should be screened for this virus. Talk with your health care provider to find out if you are at risk for hepatitis B infection. Hepatitis C Testing is  recommended for: Everyone born from [birth year] through 1967. Anyone with known risk factors for hepatitis C. Sexually transmitted infections (STIs) Get screened for STIs, including gonorrhea and chlamydia, if: You are sexually active and are younger than 65 years of age. You are older than 65 years of age and your health care provider tells you that you are at risk for this type of infection. Your sexual activity has changed since you were last screened, and you are at increased risk for chlamydia or gonorrhea. Ask your health care provider if you are at risk. Ask your health care provider about whether you are at high risk for HIV. Your health care provider may recommend a prescription medicine to help prevent HIV infection. If you choose to take medicine to prevent HIV, you should first get tested for HIV. You should then be tested every 3 months for as long as you are taking the medicine. Pregnancy If you are about to stop having your period (premenopausal) and you may become pregnant, seek counseling before you get pregnant. Take 400 to 800 micrograms (mcg) of folic acid every day if  you become pregnant. Ask for birth control (contraception) if you want to prevent pregnancy. Osteoporosis and menopause Osteoporosis is a disease in which the bones lose minerals and strength with aging. This can result in bone fractures. If you are 62 years old or older, or if you are at risk for osteoporosis and fractures, ask your health care provider if you should: Be screened for bone loss. Take a calcium  or vitamin D  supplement to lower your risk of fractures. Be given hormone replacement therapy (HRT) to treat symptoms of menopause. Follow these instructions at home: Alcohol use Do not drink alcohol if: Your health care provider tells you not to drink. You are pregnant, may be pregnant, or are planning to become pregnant. If you drink alcohol: Limit how much you have to: 0-1 drink a day. Know how much  alcohol is in your drink. In the [place] one drink equals one 12 oz bottle of beer (355 mL), one 5 oz glass of wine (148 mL), or one 1 oz glass of hard liquor (44 mL). Lifestyle Do not use any products that contain nicotine or tobacco. These products include cigarettes, chewing tobacco, and vaping devices, such as e-cigarettes. If you need help quitting, ask your health care provider. Do not use street drugs. Do not share needles. Ask your health care provider for help if you need support or information about quitting drugs. General instructions Schedule regular health, dental, and eye exams. Stay current with your vaccines. Tell your health care provider if: You often feel depressed. You have ever been abused or do not feel safe at home. Summary Adopting a healthy lifestyle and getting preventive care are important in promoting health and wellness. Follow your health care provider's instructions about healthy diet, exercising, and getting tested or screened for diseases. Follow your health care provider's instructions on monitoring your cholesterol and blood pressure. This information is not intended to replace advice given to you by your health care provider. Make sure you discuss any questions you have with your health care provider. Document Revised: 01/19/2021 Document Reviewed: 01/19/2021 Elsevier Patient Education  2024 Elsevier Inc.    Emil Schaumann, MD [facility]

## 2024-05-01 NOTE — Assessment & Plan Note (Signed)
 Much improved.  No concerns.

## 2024-05-01 NOTE — Assessment & Plan Note (Signed)
 Clinically euthyroid No history of thyroid  cancer Biopsies negative in the past

## 2024-05-01 NOTE — Assessment & Plan Note (Signed)
 Clinically stable.  Asymptomatic.  Temporary episode. Was evaluated by cardiology who recommend outpatient loop recorder monitoring.

## 2024-05-01 NOTE — Patient Instructions (Signed)

## 2024-05-01 NOTE — Transitions of Care (Post Inpatient/ED Visit) (Signed)
 05/01/2024  Name: [name] MRN: [id number] DOB: 11-27-1960  Today's TOC FU Call Status: Today's TOC FU Call Status:: Successful TOC FU Call Completed Unsuccessful Call (1st Attempt) Date: 04/30/24 [facility] FU Call Complete Date: 05/01/24 Patient's Name and Date of Birth confirmed.  Transition Care Management Follow-up Telephone Call Date of Discharge: 04/29/24 Discharge Facility: [facility]) Type of Discharge: Inpatient Admission Primary Inpatient Discharge Diagnosis:: syncope How have you been since you were released from the hospital?: Better Any questions or concerns?: No  Items Reviewed: Did you receive and understand the discharge instructions provided?: Yes Medications obtained,verified, and reconciled?: Yes (Medications Reviewed) Any new allergies since your discharge?: No Dietary orders reviewed?: Yes Do you have support at home?: Yes People in Home [RPT]: child(ren), adult  Medications Reviewed Today: Medications Reviewed Today     Reviewed by Emmitt Pan, LPN (Licensed Practical Nurse) on 05/01/24 at 1222  Med List Status: <None>   Medication Order Taking? Sig Documenting Provider Last Dose Status Informant  ALPRAZolam  (XANAX ) 0.5 MG tablet [id number] Yes Take 0.5 mg by mouth at bedtime as needed for anxiety. For MRI [provider]  Active Self, Pharmacy Records           Med Note EFRAIM, ALFREIDA CROME   Fri Apr 27, 2024  6:42 PM) LF: 12/05/23 for a 15ds  Ascorbic Acid (VITAMIN C) 1000 MG tablet [id number] Yes Take 2,000 mg by mouth at bedtime. [provider]  Active Self, Pharmacy Records  aspirin  EC 81 MG EC tablet [id number]  Take 1 tablet (81 mg total) by mouth daily.  Patient not taking: Reported on 05/01/2024   Claudene Pacific, MD  Active Self, Pharmacy Records           Med Note [facility], ALFREIDA CROME   Fri Apr 27, 2024  6:45 PM) Patient stated she is supposed to be on this medication but confirmed she does not take it.   Calcium  Carb-Cholecalciferol (OYSTER SHELL CALCIUM  250+D PO) [id number] Yes Take 1 tablet by mouth at bedtime. [provider]  Active Self, Pharmacy Records  cholecalciferol (VITAMIN D3) 25 MCG (1000 UNIT) tablet [id number] Yes Take 3,000 Units by mouth in the morning. [provider]  Active Self, Pharmacy Records           Med Note RUMINSKI, CRYSTAL D   Thu Nov 30, 2018  4:22 AM)    Cyanocobalamin  (VITAMIN B 12 PO) [id number] Yes Take 1,000 mcg by mouth in the morning. [provider]  Active Self, Pharmacy Records  cyclobenzaprine  (FLEXERIL ) 5 MG tablet [id number] Yes Take 1 tablet (5 mg total) by mouth at bedtime as needed for muscle spasms (low back pain). Patel, Donika K, DO  Active Self, Pharmacy Records           Med Note EFRAIM, ALFREIDA CROME   Fri Apr 27, 2024  6:47 PM) Patient stated she does not like to take this medication because it makes her sleepy  losartan  (COZAAR ) 25 MG tablet [id number] Yes Take 25 mg by mouth at bedtime as needed. [provider]  Active Self, Pharmacy Records           Med Note EFRAIM, ALFREIDA CROME   Fri Apr 27, 2024  6:49 PM) Patient stated she was taking this medication daily but due to bp being low at nights she has been hold off on this medication and been more so taking if she feels she needs it.  magnesium  oxide (MAG-OX) 400 (240 Mg)  MG tablet [id number] Yes Take 400 mg by mouth daily. [provider]  Active Self, Pharmacy Records  metoprolol  succinate (TOPROL -XL) 25 MG 24 hr tablet [id number]  TAKE 1 TABLET (25 MG) BY MOUTH IN THE MORNING AND AT BEDTIME  Patient not taking: Reported on 05/01/2024   Meng, Hao, PA  Active Self, Pharmacy Records           Med Note EFRAIM ALFREIDA CROME   Fri Apr 27, 2024  6:39 PM) LF: 04/23/24 for a 30ds  pantoprazole  (PROTONIX ) 40 MG tablet [id number] Yes Take 1 tablet (40 mg total) by mouth daily.  Patient taking differently: Take 40 mg by mouth daily as needed.   Albertus Gordy HERO, MD   Active Self, Pharmacy Records           Med Note EFRAIM, ALFREIDA CROME   Fri Apr 27, 2024  6:40 PM) LF: 03/28/24 for a 30ds  PARoxetine  (PAXIL -CR) 25 MG 24 hr tablet [id number] Yes Take 25 mg by mouth daily. [provider]  Active Self, Pharmacy Records           Med Note EFRAIM, ALFREIDA CROME   Fri Apr 27, 2024  6:40 PM) LF: 04/22/24 for a 30ds  potassium chloride  SA (KLOR-CON  M) 20 MEQ tablet [id number]  TAKE 2 TABLETS (40 MEQ TOTAL) BY MOUTH DAILY.  Patient not taking: Reported on 05/01/2024   Sagardia, Miguel Jose, MD  Active Self, Pharmacy Records           Med Note [facility], ALFREIDA CROME   Fri Apr 27, 2024  6:56 PM) Patient confirmed if she forgets to take her first one of this medication then she takes 2 at one time.  TURMERIC PO [id number] Yes Take 1 capsule by mouth daily as needed (pain/inflammation). [provider]  Active Self, Pharmacy Records           Med Note MYLO, POWELL CROME   Fri Oct 19, 2019  8:16 PM)              Home Care and Equipment/Supplies: Were Home Health Services Ordered?: NA Any new equipment or medical supplies ordered?: NA  Functional Questionnaire: Do you need assistance with bathing/showering or dressing?: No Do you need assistance with meal preparation?: No Do you need assistance with eating?: No Do you have difficulty maintaining continence: No Do you need assistance with getting out of bed/getting out of a chair/moving?: No Do you have difficulty managing or taking your medications?: No  Follow up appointments reviewed: PCP Follow-up appointment confirmed?: Yes Date of PCP follow-up appointment?: 05/01/24 Follow-up Provider: [facility] Follow-up appointment confirmed?: Yes Date of Specialist follow-up appointment?: 05/17/24 Follow-Up Specialty Provider:: cardio Do you need transportation to your follow-up appointment?: No Do you understand care options if your condition(s) worsen?: Yes-patient verbalized  understanding    SIGNATURE Julian Lemmings, LPN [facility] Nurse Health Advisor Direct Dial [phone]

## 2024-05-01 NOTE — Assessment & Plan Note (Signed)
 BP Readings from Last 3 Encounters:  05/01/24 102/68  04/29/24 118/60  03/12/24 112/67  Losartan  was discontinued Not taking metoprolol  succinate either

## 2024-05-12 ENCOUNTER — Ambulatory Visit
Admission: RE | Admit: 2024-05-12 | Discharge: 2024-05-12 | Disposition: A | Discharge status: Home / Self Care | Source: Ambulatory Visit | Attending: Neurology | Admitting: Neurology

## 2024-05-12 DIAGNOSIS — G8929 Other chronic pain: Secondary | ICD-10-CM

## 2024-05-12 DIAGNOSIS — R200 Anesthesia of skin: Secondary | ICD-10-CM

## 2024-05-12 DIAGNOSIS — R292 Abnormal reflex: Secondary | ICD-10-CM

## 2024-05-15 ENCOUNTER — Encounter (HOSPITAL_BASED_OUTPATIENT_CLINIC_OR_DEPARTMENT_OTHER): Payer: Self-pay

## 2024-05-17 ENCOUNTER — Ambulatory Visit: Attending: Physician Assistant | Admitting: Physician Assistant

## 2024-05-17 VITALS — BP 110/68 | HR 66 | Ht 66.0 in | Wt 194.0 lb

## 2024-05-17 DIAGNOSIS — R55 Syncope and collapse: Secondary | ICD-10-CM

## 2024-05-17 DIAGNOSIS — R002 Palpitations: Secondary | ICD-10-CM | POA: Diagnosis not present

## 2024-05-17 DIAGNOSIS — R42 Dizziness and giddiness: Secondary | ICD-10-CM

## 2024-05-17 NOTE — Patient Instructions (Signed)
 Medication Instructions:  Your physician recommends that you continue on your current medications as directed. Please refer to the Current Medication list given to you today.  *If you need a refill on your cardiac medications before your next appointment, please call your pharmacy*  Lab Work: None ordered.  If you have labs (blood work) drawn today and your tests are completely normal, you will receive your results only by: MyChart Message (if you have MyChart) OR A paper copy in the mail If you have any lab test that is abnormal or we need to change your treatment, we will call you to review the results.  Testing/Procedures: None ordered.   Follow-Up: At [facility], you and your health needs are our priority.  As part of our continuing mission to provide you with exceptional heart care, our providers are all part of one team.  This team includes your primary Cardiologist (physician) and Advanced Practice Providers or APPs (Physician Assistants and Nurse Practitioners) who all work together to provide you with the care you need, when you need it.  Your next appointment:   Referred to Dr Cindie for consideration of Internal Loop Recorder

## 2024-05-17 NOTE — Progress Notes (Signed)
 Cardiology Office Note   Date:  05/17/2024  ID:  [name], [name] 02-Jun-1961, MRN [id number] PCP: Purcell Emil Schanz, MD  [facility] Providers Cardiologist:  Dorn Lesches, MD     History of Present Illness Montrice Amica Harron is a 63 y.o. female with past medical history of OSA, palpitation/PSVT, mitral valve prolapse, and hypertension.  She has never had a heart attack or stroke.  Cardiac catheterization performed in 2009 by Dr. Claudene was normal.  She was last seen by Dr. Lesches in January 2024.  Blood pressure well-controlled.  She just underwent a sleep study.  She was having some palpitation, therefore a 2-week ZIO monitor was placed.  Heart monitor showed minimal heart rate of 46, maximal heart rate 122, average heart rate 70 bpm.  No significant arrhythmia.  PVC burden less than 1%.  She was seen by Barnie Hila NP on 12/27/2022 with multiple questions and concerns.  Echocardiogram was performed following the visit on 01/24/2023 showed EF 55 to 60%, no regional wall motion abnormality, mild RV enlargement, normal PA systolic pressure, degenerative mitral valve with trivial MR.  She was also referred for weight management. She was seen at [facility] ED on 02/27/2023 with atypical chest pain. Her chest pain was felt to be due to tachyarrhythmia. She had positive enzymes, serial troponin 66-->219-->247-->50.SABRA Cardiology service was consulted. Limited echocardiogram showed EF 55 to 60%, no regional wall motion abnormality, mild TR, no pericardial effusion. Coronary CT performed on 02/28/2023 showed normal coronary arteries, no coronary artery disease. Coronary calcium  score of 0. Lipid panel obtained in the hospital showed a total cholesterol 137, triglyceride 44, HDL 49, LDL 79. TSH was normal. Outpatient heart monitor was recommended.  When I saw her for follow-up, I changed metoprolol  to tartrate to metoprolol  succinate 25 mg twice a day to give her more coverage.  I also  ordered a 30-day heart monitor that showed PVCs but no sustained arrhythmia.  I discussed with the patient possibility of considering a Kardia mobile device.  I last saw the patient in September 2024.  She was seen in the ED for syncope in December 2024 the day after her knee surgery.  She was mildly dehydrated.  More recently, patient was seen in the hospital on 04/27/2024 with presyncope.  She complained of increased PVCs after eating meal.  When EMS arrived and found her to be hypertensive, tachycardic.  Heart rate was up to 140s.  Patient was seen by Dr. Cindie of EP service who felt her symptom is not arrhythmic in origin.  She had no sustained arrhythmia on the telemetry either.  If symptom persist, may consider outpatient loop recorder.  DVT was normal.  High-sensitivity troponin mildly elevated up to 81 before trending back down.  This was felt to be demand ischemia.  Echocardiogram obtained 04/28/2024 showed EF 60 to 65%, RVSP 26.4 mmHg, no significant valvular disease.  Patient presents today for follow-up.  She previously had a similar event in July of last year.  She is quite concerned the reason behind her near passing out spells.  She says they occurred at different times than the palpitation.  She does not think the near passing out spell and the palpitations are truly related.  I do not have a great explanation why her heart rate went up to the 140s.  She is interested in outpatient loop recorder that was mentioned by Dr. Cindie in the hospital.  I will have her follow-up with the EP  ROS:   She complains of occasional palpitation and presyncope.  She denies any chest pain or shortness of breath.  Studies Reviewed      Cardiac Studies & Procedures   ______________________________________________________________________________________________   STRESS TESTS  NM MYOCAR MULTI W/SPECT W 10/20/2019  Narrative CLINICAL DATA:  63 year old who presented with chest pain and had  an abnormal EKG demonstrating INFERIOR and LATERAL wall ischemia. Current history of supraventricular tachycardia. Family history of coronary artery disease.  EXAM: MYOCARDIAL IMAGING WITH SPECT (REST AND PHARMACOLOGIC-STRESS)  GATED LEFT VENTRICULAR WALL MOTION STUDY  LEFT VENTRICULAR EJECTION FRACTION  TECHNIQUE: Standard myocardial SPECT imaging was performed after resting intravenous injection of 10 mCi Tc-30m tetrofosmin . Subsequently, intravenous infusion of Lexiscan  was performed under the supervision of the Cardiology staff. At peak effect of the drug, 31 mCi Tc-84m tetrofosmin  was injected intravenously and standard myocardial SPECT imaging was performed. Quantitative gated imaging was also performed to evaluate left ventricular wall motion, and estimate left ventricular ejection fraction.  COMPARISON:  None.  FINDINGS: Perfusion: Diminished perfusion in the apex on the post Lexiscan  and resting images without evidence of reversibility. No perfusion defects elsewhere in the LEFT ventricular myocardium.  Wall Motion: Normal left ventricular wall motion. No left ventricular dilation.  Left Ventricular Ejection Fraction: 75%  End diastolic volume 79 ml  End systolic volume 19 ml  IMPRESSION: 1. Apical thinning due to diaphragmatic and breast attenuation versus focal apical infarct. No evidence of myocardial ischemia.  2. Normal left ventricular wall motion.  3. Left ventricular ejection fraction 75%  4. Non invasive risk stratification*: Low.  *2012 Appropriate Use Criteria for Coronary Revascularization Focused Update: J Am Coll Cardiol. 2012;59(9):857-881. http://content.dementiazones.com.aspx?articleid=1201161   Electronically Signed By: Debby Satterfield M.D. On: 10/20/2019 11:35   ECHOCARDIOGRAM  ECHOCARDIOGRAM COMPLETE 04/28/2024  Narrative ECHOCARDIOGRAM REPORT    Patient Name:   JAILEIGH WEIMER Kauffmann Date of Exam: 04/28/2024 Medical  Rec #:  [id number]              Height:       66.0 in Accession #:    [id number]             Weight:       194.6 lb Date of Birth:  Sep 21, 1960              BSA:          1.977 m Patient Age:    63 years               BP:           100/53 mmHg Patient Gender: F                      HR:           72 bpm. Exam Location:  Inpatient  Procedure: 2D Echo, Cardiac Doppler and Color Doppler (Both Spectral and Color Flow Doppler were utilized during procedure).  Indications:    R55 Syncope  History:        Patient has prior history of Echocardiogram examinations, most recent 01/24/2023. Abnormal ECG, Arrythmias:SVT, Signs/Symptoms:Dizziness/Lightheadedness and Murmur; Risk Factors:Sleep Apnea and Hypertension.  Sonographer:    Ellouise Mose RDCS Referring Phys: [id number] [facility]   Sonographer Comments: Image acquisition challenging due to respiratory motion. Some images repeated after consult during study. IMPRESSIONS   1. Left ventricular ejection fraction, by estimation, is 60 to 65%. The left ventricle has normal function. The left ventricle has no regional wall  motion abnormalities. Left ventricular diastolic parameters were normal. 2. Right ventricular systolic function is normal. The right ventricular size is normal. There is normal pulmonary artery systolic pressure. The estimated right ventricular systolic pressure is 26.4 mmHg. 3. The mitral valve is grossly normal. Trivial mitral valve regurgitation. No evidence of mitral stenosis. 4. The aortic valve is tricuspid. Aortic valve regurgitation is not visualized. No aortic stenosis is present. 5. The inferior vena cava is normal in size with greater than 50% respiratory variability, suggesting right atrial pressure of 3 mmHg.  Conclusion(s)/Recommendation(s): Normal biventricular function without evidence of hemodynamically significant valvular heart disease.  FINDINGS Left Ventricle: Left ventricular ejection fraction, by estimation, is  60 to 65%. The left ventricle has normal function. The left ventricle has no regional wall motion abnormalities. The left ventricular internal cavity size was normal in size. There is no left ventricular hypertrophy. Left ventricular diastolic parameters were normal.  Right Ventricle: The right ventricular size is normal. No increase in right ventricular wall thickness. Right ventricular systolic function is normal. There is normal pulmonary artery systolic pressure. The tricuspid regurgitant velocity is 2.42 m/s, and with an assumed right atrial pressure of 3 mmHg, the estimated right ventricular systolic pressure is 26.4 mmHg.  Left Atrium: Left atrial size was normal in size.  Right Atrium: Right atrial size was normal in size.  Pericardium: Trivial pericardial effusion is present.  Mitral Valve: The mitral valve is grossly normal. Trivial mitral valve regurgitation. No evidence of mitral valve stenosis.  Tricuspid Valve: The tricuspid valve is grossly normal. Tricuspid valve regurgitation is trivial. No evidence of tricuspid stenosis.  Aortic Valve: The aortic valve is tricuspid. Aortic valve regurgitation is not visualized. No aortic stenosis is present.  Pulmonic Valve: The pulmonic valve was normal in structure. Pulmonic valve regurgitation is trivial. No evidence of pulmonic stenosis.  Aorta: The aortic root and ascending aorta are structurally normal, with no evidence of dilitation.  Venous: The inferior vena cava is normal in size with greater than 50% respiratory variability, suggesting right atrial pressure of 3 mmHg.  IAS/Shunts: The atrial septum is grossly normal.   LEFT VENTRICLE PLAX 2D LVIDd:         4.03 cm     Diastology LVIDs:         2.64 cm     LV e' medial:    8.70 cm/s LV PW:         1.39 cm     LV E/e' medial:  10.9 LV IVS:        0.94 cm     LV e' lateral:   11.30 cm/s LVOT diam:     1.88 cm     LV E/e' lateral: 8.4 LV SV:         67 LV SV Index:    34 LVOT Area:     2.78 cm  LV Volumes (MOD) LV vol d, MOD A2C: 67.2 ml LV vol d, MOD A4C: 72.3 ml LV vol s, MOD A2C: 21.5 ml LV vol s, MOD A4C: 25.4 ml LV SV MOD A2C:     45.7 ml LV SV MOD A4C:     72.3 ml LV SV MOD BP:      46.2 ml  RIGHT VENTRICLE             IVC RV S prime:     13.70 cm/s  IVC diam: 1.32 cm TAPSE (M-mode): 2.4 cm  LEFT ATRIUM  Index        RIGHT ATRIUM           Index LA diam:        2.86 cm 1.45 cm/m   RA Area:     16.50 cm LA Vol (A2C):   34.7 ml 17.55 ml/m  RA Volume:   48.30 ml  24.43 ml/m LA Vol (A4C):   31.7 ml 16.04 ml/m LA Biplane Vol: 36.0 ml 18.21 ml/m AORTIC VALVE LVOT Vmax:   118.00 cm/s LVOT Vmean:  79.200 cm/s LVOT VTI:    0.241 m  AORTA Ao Root diam: 2.94 cm Ao Asc diam:  2.97 cm  MITRAL VALVE                TRICUSPID VALVE MV Area (PHT): 4.02 cm     TR Peak grad:   23.4 mmHg MV Decel Time: 189 msec     TR Vmax:        242.00 cm/s MV E velocity: 94.85 cm/s MV A velocity: 118.00 cm/s  SHUNTS MV E/A ratio:  0.80         Systemic VTI:  0.24 m Systemic Diam: 1.88 cm  Darryle Decent MD Electronically signed by Darryle Decent MD Signature Date/Time: 04/28/2024/11:40:10 AM    Final    MONITORS  CARDIAC EVENT MONITOR 04/25/2023  Narrative SR/SB/ST Occasional PVCs Rare short runs of NSVT       ______________________________________________________________________________________________      Risk Assessment/Calculations           Physical Exam VS:  BP 110/68   Pulse 66   Ht 5' 6 (1.676 m)   Wt 194 lb (88 kg)   LMP 09/17/2016 (Approximate)   SpO2 95%   BMI 31.31 kg/m        Wt Readings from Last 3 Encounters:  05/17/24 194 lb (88 kg)  05/01/24 192 lb (87.1 kg)  04/27/24 194 lb 9.6 oz (88.3 kg)    GEN: Well nourished, well developed in no acute distress NECK: No JVD; No carotid bruits CARDIAC: RRR, no murmurs, rubs, gallops RESPIRATORY:  Clear to auscultation without rales, wheezing or rhonchi   ABDOMEN: Soft, non-tender, non-distended EXTREMITIES:  No edema; No deformity   ASSESSMENT AND PLAN  Presyncope: Seen by Dr. Cindie during the recent hospitalization, previous workup including echocardiogram and heart monitor has been reassuring.  I reviewed the recent heart monitor with the patient.  She is interested in loop recorder as mentioned by Dr. Inocencio.  Will have her follow-up with the EP service.  Palpitation: No significant finding on the recent heart monitor.       Dispo: Follow-up with Dr. Cindie.  Signed, Scot Ford, PA

## 2024-05-23 ENCOUNTER — Ambulatory Visit: Payer: Self-pay | Admitting: Neurology

## 2024-05-23 DIAGNOSIS — G8929 Other chronic pain: Secondary | ICD-10-CM

## 2024-05-23 DIAGNOSIS — M5416 Radiculopathy, lumbar region: Secondary | ICD-10-CM

## 2024-05-23 DIAGNOSIS — R200 Anesthesia of skin: Secondary | ICD-10-CM

## 2024-05-23 DIAGNOSIS — M51369 Other intervertebral disc degeneration, lumbar region without mention of lumbar back pain or lower extremity pain: Secondary | ICD-10-CM

## 2024-05-28 ENCOUNTER — Ambulatory Visit: Attending: Neurology

## 2024-05-28 VITALS — BP 104/63 | HR 65

## 2024-05-28 DIAGNOSIS — R200 Anesthesia of skin: Secondary | ICD-10-CM | POA: Insufficient documentation

## 2024-05-28 DIAGNOSIS — R2681 Unsteadiness on feet: Secondary | ICD-10-CM | POA: Insufficient documentation

## 2024-05-28 DIAGNOSIS — M6281 Muscle weakness (generalized): Secondary | ICD-10-CM | POA: Insufficient documentation

## 2024-05-28 DIAGNOSIS — R293 Abnormal posture: Secondary | ICD-10-CM | POA: Diagnosis not present

## 2024-05-28 DIAGNOSIS — M545 Low back pain, unspecified: Secondary | ICD-10-CM | POA: Diagnosis not present

## 2024-05-28 DIAGNOSIS — M542 Cervicalgia: Secondary | ICD-10-CM | POA: Diagnosis not present

## 2024-05-28 DIAGNOSIS — R292 Abnormal reflex: Secondary | ICD-10-CM | POA: Insufficient documentation

## 2024-05-28 DIAGNOSIS — M9903 Segmental and somatic dysfunction of lumbar region: Secondary | ICD-10-CM | POA: Diagnosis not present

## 2024-05-28 DIAGNOSIS — M9901 Segmental and somatic dysfunction of cervical region: Secondary | ICD-10-CM | POA: Diagnosis not present

## 2024-05-28 DIAGNOSIS — M546 Pain in thoracic spine: Secondary | ICD-10-CM | POA: Diagnosis not present

## 2024-05-28 DIAGNOSIS — G8929 Other chronic pain: Secondary | ICD-10-CM | POA: Diagnosis not present

## 2024-05-28 DIAGNOSIS — M9902 Segmental and somatic dysfunction of thoracic region: Secondary | ICD-10-CM | POA: Diagnosis not present

## 2024-05-28 NOTE — Therapy (Unsigned)
 OUTPATIENT PHYSICAL THERAPY NEURO EVALUATION   Patient Name: [name] MRN: [id number] DOB:1960/12/12, 63 y.o., female Today's Date: 05/28/2024   PCP: Emil Aloysius Schaumann, MD REFERRING PROVIDER: Tonita Blanch, DO  END OF SESSION:  PT End of Session - 05/28/24 1530     Visit Number 1    Number of Visits 5    Date for PT Re-Evaluation 06/29/24    Authorization Type [employer] medicare- auth required    PT Start Time 1532    PT Stop Time 1614    PT Time Calculation (min) 42 min    Activity Tolerance Patient tolerated treatment well    Behavior During Therapy [facility] for tasks assessed/performed          Past Medical History:  Diagnosis Date   Anemia    Anxiety    Colon polyp 08/28/2012   Tubular adenoma   Dense breast    Depression    Genital warts    GERD (gastroesophageal reflux disease)    Heart murmur    Hemorrhoids    Hx of cardiac catheterization 2009   normal coronary arteries   Hx of menorrhagia    Novasure   Hypokalemia    Hypomagnesemia    Hypophosphatemia    Lactose intolerance in adult    Meniscus degeneration, right    torn r knee/ 10/2019   Mitral valve prolapse    Positive PPD    PSVT (paroxysmal supraventricular tachycardia) (HCC)    PVC's (premature ventricular contractions)    Sleep apnea    Thyroid  disease    Thyroid  nodules   Past Surgical History:  Procedure Laterality Date   CARDIAC CATHETERIZATION     x2 with normal results per pt   KNEE ARTHROSCOPY W/ MENISCECTOMY Right 08/2023   NOVASURE ABLATION     UPPER GASTROINTESTINAL ENDOSCOPY  01/12/2012   Patient Active Problem List   Diagnosis Date Noted   Postural dizziness with presyncope 04/27/2024   Mixed conductive and sensorineural hearing loss of right ear with restricted hearing of left ear 08/15/2023   Hypokalemia 02/27/2023   Primary hypertension 02/27/2023   Family history of bladder cancer 07/05/2022   Intermittent gross hematuria 07/05/2022   Chronic sinusitis  08/25/2021   Prediabetes 08/25/2021   Paroxysmal SVT (supraventricular tachycardia) (HCC) 03/31/2021   Degenerative disc disease, cervical 10/13/2020   Stress incontinence of urine 10/31/2018   History of diverticulosis 05/04/2017   History of PSVT (paroxysmal supraventricular tachycardia) 06/24/2012   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   Multinodular goiter 05/12/2012   Sleep apnea 01/19/2008   IRRITABLE BOWEL SYNDROME 01/18/2008    ONSET DATE: 03/12/24 referral   REFERRING DIAG:  R29.2 (ICD-10-CM) - Hyperreflexia  R20.0 (ICD-10-CM) - Left leg numbness  M54.50,G89.29 (ICD-10-CM) - Chronic midline low back pain without sciatica    THERAPY DIAG:  Unsteadiness on feet  Abnormal posture  Muscle weakness (generalized)  Rationale for Evaluation and Treatment: Rehabilitation  SUBJECTIVE:  SUBJECTIVE STATEMENT: Patient arrives to clinic alone, no AD. She endorses pain in her lower back and neck. She reports a long history of back pain, but this has gotten worse in the past 2 months. Denies any overt MOI, but states she may have slept funny. 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 She does have an appt with neurosurgery on Wednesday this week.  Pt accompanied by: self  PERTINENT HISTORY: anemia, anxiety, depression, GERD, hypokalemia, hypomagnesemia, hypophosphatemia, PSVT, PVCs, OSA, thyroid  dz  PAIN:  Are you having pain? Yes: NPRS scale: 5/10 Pain location: SIJ, radiating down L LE Pain description: jab stab Aggravating factors: bending forward, pulling her patient up  Relieving factors: stretches that looks like piriformis stretches, spinal decompression   PRECAUTIONS: Fall  RED FLAGS: Endorses changes in bowel habits, less often  WEIGHT  BEARING RESTRICTIONS: No  FALLS: Has patient fallen in last 6 months? No  LIVING ENVIRONMENT: Lives with: lives alone Lives in: House/apartment Stairs: Yes: External: 5 steps; on right going up Has following equipment at home: Crutches and Shower bench  PLOF: Independent part time healthcare provider  PATIENT GOALS: unable to state  OBJECTIVE:  Note: Objective measures were completed at Evaluation unless otherwise noted.  DIAGNOSTIC FINDINGS: 05/12/24 lumbar MRI  IMPRESSION: 1. Mildly transitional lumbosacral anatomy with partially sacralized L5 level. Correlation with radiographs is recommended prior to any operative intervention.   2. Dominant lumbar spine degenerative finding is widespread facet arthropathy. And a degenerative synovial cyst on the right at L4-L5 projects toward the right lateral recess, with moderate multifactorial right lateral recess and moderate to severe right foraminal stenosis at that level (right L5 and L4 nerve levels, respectively). But with regard to left side radiating pain the symptomatic level might be L3-L4, where a small left paracentral disc protrusion superimposed on the posterior element hypertrophy results in moderate spinal and left lateral recess stenosis. Query Left L4 radiculitis. Mild multifactorial spinal stenosis also at L2-L3.  COGNITION: Overall cognitive status: Within functional limits for tasks assessed   SENSATION: Hyposensate in L4/L5 distribution in L LE  COORDINATION: WFL B LE with heel/shin and figure 8   POSTURE: rounded shoulders, forward head, increased thoracic kyphosis, posterior pelvic tilt, and flexed trunk    LOWER EXTREMITY MMT:    MMT Right Eval Left Eval  Hip flexion 5 4+  Hip extension    Hip abduction 5 5  Hip adduction 5 5  Hip internal rotation    Hip external rotation    Knee flexion 5 4+  Knee extension 5 4+  Ankle dorsiflexion 5 4+  Ankle plantarflexion    Ankle inversion     Ankle eversion    (Blank rows = not tested) GAIT: Findings: Gait Characteristics: step through pattern, trendelenburg, and wide BOS, Distance walked: clinic, and Assistive device utilized:None  FUNCTIONAL TESTS:  {Functional tests:24029}  LUMBAR SPECIAL TESTS:  Slump test: Positive, Quadrant test: Positive, and Single leg stance test: Negative   PATIENT SURVEYS:  Modified Oswestry:  MODIFIED OSWESTRY DISABILITY SCALE  Date: 05/28/24 Score  Pain intensity {ODI 1:32962}  2. Personal care (washing, dressing, etc.) {ODI 2:32963}  3. Lifting {ODI 3:32964}  4. Walking {ODI 4:32965}  5. Sitting {ODI 5:32966}  6. Standing {ODI 6:32967}  7. Sleeping {ODI 7:32968}  8. Social Life {ODI 8:32969}  9. Traveling {ODI 9:32970}  10. Employment/ Homemaking {ODI [id number]  Total ***/50   Interpretation of scores: Score Category Description  0-20% Minimal  Disability The patient can cope with most living activities. Usually no treatment is indicated apart from advice on lifting, sitting and exercise  21-40% Moderate Disability The patient experiences more pain and difficulty with sitting, lifting and standing. Travel and social life are more difficult and they may be disabled from work. Personal care, sexual activity and sleeping are not grossly affected, and the patient can usually be managed by conservative means  41-60% Severe Disability Pain remains the main problem in this group, but activities of daily living are affected. These patients require a detailed investigation  61-80% Crippled Back pain impinges on all aspects of the patient's life. Positive intervention is required  81-100% Bed-bound  These patients are either bed-bound or exaggerating their symptoms  Bluford FORBES Zoe DELENA Karon DELENA, et al. Surgery versus conservative management of stable thoracolumbar fracture: the PRESTO feasibility RCT. [place]): [employer]; 2021 Nov. [facility] [employer] Assessment, No. 25.62.)  Appendix 3, Oswestry Disability Index category descriptors. Available from: FindJewel[employer]cz  Minimally Clinically Important Difference (MCID) = 12.8%                                                                                                                              TREATMENT DATE: ***    PATIENT EDUCATION: Education details: *** Person educated: {Person educated:25204} Education method: {Education Method:25205} Education comprehension: {Education Comprehension:25206}  HOME EXERCISE PROGRAM: ***  GOALS: Goals reviewed with patient? {yes/no:20286}  SHORT TERM GOALS: Target date: ***  *** Baseline: Goal status: INITIAL  2.  *** Baseline:  Goal status: INITIAL  3.  *** Baseline:  Goal status: INITIAL  4.  *** Baseline:  Goal status: INITIAL  5.  *** Baseline:  Goal status: INITIAL  6.  *** Baseline:  Goal status: INITIAL  LONG TERM GOALS: Target date: ***  *** Baseline:  Goal status: INITIAL  2.  *** Baseline:  Goal status: INITIAL  3.  *** Baseline:  Goal status: INITIAL  4.  *** Baseline:  Goal status: INITIAL  5.  *** Baseline:  Goal status: INITIAL  6.  *** Baseline:  Goal status: INITIAL  ASSESSMENT:  CLINICAL IMPRESSION: Patient is a *** y.o. *** who was seen today for physical therapy evaluation and treatment for ***.   OBJECTIVE IMPAIRMENTS: {opptimpairments:25111}.   ACTIVITY LIMITATIONS: {activitylimitations:27494}  PARTICIPATION LIMITATIONS: {participationrestrictions:25113}  PERSONAL FACTORS: {Personal factors:25162} are also affecting patient's functional outcome.   REHAB POTENTIAL: {rehabpotential:25112}  CLINICAL DECISION MAKING: {clinical decision making:25114}  EVALUATION COMPLEXITY: {Evaluation complexity:25115}  PLAN:  PT FREQUENCY: {rehab frequency:25116}  PT DURATION: {rehab duration:25117}  PLANNED INTERVENTIONS: {rehab planned  interventions:25118::97110-Therapeutic exercises,97530- Therapeutic [phone]- Neuromuscular re-education,97535- Self Rjmz,02859- Manual therapy,Patient/Family education}  PLAN FOR NEXT SESSION: ***   Delon DELENA Pop, PT Delon DELENA Pop, PT, DPT, CBIS  05/28/2024, 4:17 PM

## 2024-05-30 DIAGNOSIS — M4802 Spinal stenosis, cervical region: Secondary | ICD-10-CM | POA: Diagnosis not present

## 2024-06-04 ENCOUNTER — Ambulatory Visit

## 2024-06-04 DIAGNOSIS — M9902 Segmental and somatic dysfunction of thoracic region: Secondary | ICD-10-CM | POA: Diagnosis not present

## 2024-06-04 DIAGNOSIS — M542 Cervicalgia: Secondary | ICD-10-CM | POA: Diagnosis not present

## 2024-06-04 DIAGNOSIS — M9903 Segmental and somatic dysfunction of lumbar region: Secondary | ICD-10-CM | POA: Diagnosis not present

## 2024-06-04 DIAGNOSIS — M546 Pain in thoracic spine: Secondary | ICD-10-CM | POA: Diagnosis not present

## 2024-06-04 DIAGNOSIS — M9901 Segmental and somatic dysfunction of cervical region: Secondary | ICD-10-CM | POA: Diagnosis not present

## 2024-06-04 DIAGNOSIS — M545 Low back pain, unspecified: Secondary | ICD-10-CM | POA: Diagnosis not present

## 2024-06-11 ENCOUNTER — Ambulatory Visit

## 2024-06-11 DIAGNOSIS — R293 Abnormal posture: Secondary | ICD-10-CM

## 2024-06-11 DIAGNOSIS — R2681 Unsteadiness on feet: Secondary | ICD-10-CM | POA: Diagnosis not present

## 2024-06-11 DIAGNOSIS — M545 Low back pain, unspecified: Secondary | ICD-10-CM | POA: Diagnosis not present

## 2024-06-11 DIAGNOSIS — R200 Anesthesia of skin: Secondary | ICD-10-CM | POA: Diagnosis not present

## 2024-06-11 DIAGNOSIS — M6281 Muscle weakness (generalized): Secondary | ICD-10-CM

## 2024-06-11 DIAGNOSIS — G8929 Other chronic pain: Secondary | ICD-10-CM | POA: Diagnosis not present

## 2024-06-11 DIAGNOSIS — R292 Abnormal reflex: Secondary | ICD-10-CM | POA: Diagnosis not present

## 2024-06-11 NOTE — Therapy (Signed)
 OUTPATIENT PHYSICAL THERAPY NEURO TREATMENT   Patient Name: [name] MRN: [id number] DOB:1961/04/27, 63 y.o., female Today's Date: 06/11/2024   PCP: Emil Aloysius Schaumann, MD REFERRING PROVIDER: Tonita Blanch, DO  END OF SESSION:  PT End of Session - 06/11/24 1314     Visit Number 2    Number of Visits 5    Date for Recertification  06/29/24    Authorization Type [employer]HC medicare- auth required    PT Start Time 1317    PT Stop Time 1358    PT Time Calculation (min) 41 min    Activity Tolerance Patient tolerated treatment well    Behavior During Therapy [facility] for tasks assessed/performed          Past Medical History:  Diagnosis Date   Anemia    Anxiety    Colon polyp 08/28/2012   Tubular adenoma   Dense breast    Depression    Genital warts    GERD (gastroesophageal reflux disease)    Heart murmur    Hemorrhoids    Hx of cardiac catheterization 2009   normal coronary arteries   Hx of menorrhagia    Novasure   Hypokalemia    Hypomagnesemia    Hypophosphatemia    Lactose intolerance in adult    Meniscus degeneration, right    torn r knee/ 10/2019   Mitral valve prolapse    Positive PPD    PSVT (paroxysmal supraventricular tachycardia)    PVC's (premature ventricular contractions)    Sleep apnea    Thyroid  disease    Thyroid  nodules   Past Surgical History:  Procedure Laterality Date   CARDIAC CATHETERIZATION     x2 with normal results per pt   KNEE ARTHROSCOPY W/ MENISCECTOMY Right 08/2023   NOVASURE ABLATION     UPPER GASTROINTESTINAL ENDOSCOPY  01/12/2012   Patient Active Problem List   Diagnosis Date Noted   Postural dizziness with presyncope 04/27/2024   Mixed conductive and sensorineural hearing loss of right ear with restricted hearing of left ear 08/15/2023   Hypokalemia 02/27/2023   Primary hypertension 02/27/2023   Family history of bladder cancer 07/05/2022   Intermittent gross hematuria 07/05/2022   Chronic sinusitis 08/25/2021    Prediabetes 08/25/2021   Paroxysmal SVT (supraventricular tachycardia) 03/31/2021   Degenerative disc disease, cervical 10/13/2020   Stress incontinence of urine 10/31/2018   History of diverticulosis 05/04/2017   History of PSVT (paroxysmal supraventricular tachycardia) 06/24/2012   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   Multinodular goiter 05/12/2012   Sleep apnea 01/19/2008   IRRITABLE BOWEL SYNDROME 01/18/2008    ONSET DATE: 03/12/24 referral   REFERRING DIAG:  R29.2 (ICD-10-CM) - Hyperreflexia  R20.0 (ICD-10-CM) - Left leg numbness  M54.50,G89.29 (ICD-10-CM) - Chronic midline low back pain without sciatica    THERAPY DIAG:  Unsteadiness on feet  Abnormal posture  Muscle weakness (generalized)  Rationale for Evaluation and Treatment: Rehabilitation  SUBJECTIVE:  SUBJECTIVE STATEMENT: Patient arrives to clinic alone. Denies falls. 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 Had to bend down a lot.  Pt accompanied by: self  PERTINENT HISTORY: anemia, anxiety, depression, GERD, hypokalemia, hypomagnesemia, hypophosphatemia, PSVT, PVCs, OSA, thyroid  dz  PAIN:  Are you having pain? Yes: NPRS scale: 5/10 Pain location: SIJ, radiating down L LE Pain description: jab stab Aggravating factors: bending forward, pulling her patient up  Relieving factors: stretches that looks like piriformis stretches, spinal decompression   PRECAUTIONS: Fall   PATIENT GOALS: unable to state  OBJECTIVE:  Note: Objective measures were completed at Evaluation unless otherwise noted.  DIAGNOSTIC FINDINGS: 05/12/24 lumbar MRI  IMPRESSION: 1. Mildly transitional lumbosacral anatomy with partially sacralized L5 level. Correlation with radiographs is recommended prior to  any operative intervention.   2. Dominant lumbar spine degenerative finding is widespread facet arthropathy. And a degenerative synovial cyst on the right at L4-L5 projects toward the right lateral recess, with moderate multifactorial right lateral recess and moderate to severe right foraminal stenosis at that level (right L5 and L4 nerve levels, respectively). But with regard to left side radiating pain the symptomatic level might be L3-L4, where a small left paracentral disc protrusion superimposed on the posterior element hypertrophy results in moderate spinal and left lateral recess stenosis. Query Left L4 radiculitis. Mild multifactorial spinal stenosis also at L2-L3.                                                                         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 Education method: Explanation Education comprehension: verbalized understanding and needs further education  HOME EXERCISE PROGRAM: Access Code: [email or web address] URL: [email or web address] Date: 06/11/2024 Prepared by: Delon Pop  Exercises - Seated Slump Nerve Glide  - 1 x daily - 7 x weekly - 3 sets - 8-10 reps - Seated Sciatic Tensioner  - 1 x daily - 7 x weekly - 3 sets - 8-10 reps - Supine Transversus Abdominis Bracing - Hands on Thighs  - 1 x daily - 7 x weekly - 3 sets - 15 reps - Supine Lower Trunk Rotation  - 1 x daily - 7 x weekly - 3 sets - 15 reps - Dead Bug  - 1 x daily - 7 x weekly - 3 sets - 10 reps - Prone Press Up On Elbows  - 1 x daily - 7 x weekly - 3 sets - 15 reps - Standing Anti-Rotation Press with Anchored Resistance  - 1 x daily - 7 x weekly - 3  sets - 8-10 reps  GOALS: Goals reviewed with patient? Yes  SHORT TERM GOALS: = LTG based on PT POC length   LONG TERM GOALS: Target date: 06/29/2024  Pt will be independent with final HEP for improved functional strength  Baseline: to be provided Goal status: INITIAL  2.  Patient will demonstrate ability to lift at least  30lbs from the floor with no increase in symptoms  Baseline: 20lbs with 1 point increase in pain from baseline Goal status: INITIAL  ASSESSMENT:  CLINICAL IMPRESSION: Patient seen for skilled PT session with emphasis on establishing initial HEP. Majority exercises targeting core endurance to minimize excessive strain while completing her work duties. Reported most relief from gentle low back stretch with physioball. Continue POC.   OBJECTIVE IMPAIRMENTS: decreased knowledge of condition, decreased strength, increased muscle spasms, impaired flexibility, impaired sensation, improper body mechanics, postural dysfunction, and pain.   ACTIVITY LIMITATIONS: carrying, lifting, bending, transfers, bed mobility, bathing, locomotion level, and caring for others  PARTICIPATION LIMITATIONS: interpersonal relationship, driving, shopping, community activity, and occupation  PERSONAL FACTORS: Age, Fitness, Past/current experiences, Profession, and Time since onset of injury/illness/exacerbation are also affecting patient's functional outcome.   REHAB POTENTIAL: Fair time since onset  CLINICAL DECISION MAKING: Stable/uncomplicated  EVALUATION COMPLEXITY: Low  PLAN:  PT FREQUENCY: 1x/week  PT DURATION: 4 weeks  PLANNED INTERVENTIONS: [id number]- PT Re-evaluation, 97750- Physical Performance Testing, 97110-Therapeutic exercises, 97530- Therapeutic activity, [id number]- Neuromuscular re-education, 97535- Self Care, 02859- Manual therapy, [id number]- Gait training, [phone]- Orthotic Initial, [phone]- Orthotic/Prosthetic subsequent, [phone]- Canalith repositioning, [phone]- Aquatic Therapy, [phone]  Electrical stimulation (unattended), [phone]- Electrical stimulation (manual), [phone] (1-2 muscles), [phone] (3+ muscles)- Dry Needling, Patient/Family education, Balance training, Stair training, Taping, Joint mobilization, Spinal mobilization, Vestibular training, Visual/preceptual remediation/compensation, DME instructions, Cryotherapy, and Moist heat  PLAN FOR NEXT SESSION:  lifting mechanics, core stability, low back stretches, posture retraining, wants to add 1 additional visit to make up at the end   Delon DELENA Pop, PT Delon DELENA Pop, PT, DPT, CBIS  06/11/2024, 2:04 PM

## 2024-06-13 DIAGNOSIS — M546 Pain in thoracic spine: Secondary | ICD-10-CM | POA: Diagnosis not present

## 2024-06-13 DIAGNOSIS — M542 Cervicalgia: Secondary | ICD-10-CM | POA: Diagnosis not present

## 2024-06-13 DIAGNOSIS — M9902 Segmental and somatic dysfunction of thoracic region: Secondary | ICD-10-CM | POA: Diagnosis not present

## 2024-06-13 DIAGNOSIS — M9901 Segmental and somatic dysfunction of cervical region: Secondary | ICD-10-CM | POA: Diagnosis not present

## 2024-06-13 DIAGNOSIS — M545 Low back pain, unspecified: Secondary | ICD-10-CM | POA: Diagnosis not present

## 2024-06-13 DIAGNOSIS — M9903 Segmental and somatic dysfunction of lumbar region: Secondary | ICD-10-CM | POA: Diagnosis not present

## 2024-06-16 DIAGNOSIS — M9903 Segmental and somatic dysfunction of lumbar region: Secondary | ICD-10-CM | POA: Diagnosis not present

## 2024-06-16 DIAGNOSIS — M542 Cervicalgia: Secondary | ICD-10-CM | POA: Diagnosis not present

## 2024-06-16 DIAGNOSIS — M545 Low back pain, unspecified: Secondary | ICD-10-CM | POA: Diagnosis not present

## 2024-06-16 DIAGNOSIS — M546 Pain in thoracic spine: Secondary | ICD-10-CM | POA: Diagnosis not present

## 2024-06-16 DIAGNOSIS — M9902 Segmental and somatic dysfunction of thoracic region: Secondary | ICD-10-CM | POA: Diagnosis not present

## 2024-06-16 DIAGNOSIS — M9901 Segmental and somatic dysfunction of cervical region: Secondary | ICD-10-CM | POA: Diagnosis not present

## 2024-06-18 ENCOUNTER — Ambulatory Visit: Attending: Neurology

## 2024-06-18 DIAGNOSIS — M9901 Segmental and somatic dysfunction of cervical region: Secondary | ICD-10-CM | POA: Diagnosis not present

## 2024-06-18 DIAGNOSIS — R2681 Unsteadiness on feet: Secondary | ICD-10-CM | POA: Diagnosis present

## 2024-06-18 DIAGNOSIS — M9903 Segmental and somatic dysfunction of lumbar region: Secondary | ICD-10-CM | POA: Diagnosis not present

## 2024-06-18 DIAGNOSIS — M546 Pain in thoracic spine: Secondary | ICD-10-CM | POA: Diagnosis not present

## 2024-06-18 DIAGNOSIS — M6281 Muscle weakness (generalized): Secondary | ICD-10-CM | POA: Diagnosis present

## 2024-06-18 DIAGNOSIS — M545 Low back pain, unspecified: Secondary | ICD-10-CM | POA: Diagnosis not present

## 2024-06-18 DIAGNOSIS — M542 Cervicalgia: Secondary | ICD-10-CM | POA: Diagnosis not present

## 2024-06-18 DIAGNOSIS — R293 Abnormal posture: Secondary | ICD-10-CM | POA: Diagnosis present

## 2024-06-18 DIAGNOSIS — M9902 Segmental and somatic dysfunction of thoracic region: Secondary | ICD-10-CM | POA: Diagnosis not present

## 2024-06-18 NOTE — Therapy (Signed)
 OUTPATIENT PHYSICAL THERAPY NEURO TREATMENT   Patient Name: [name] MRN: [id number] DOB:12-02-1960, 63 y.o., female Today's Date: 06/18/2024   PCP: Emil Aloysius Schaumann, MD REFERRING PROVIDER: Tonita Blanch, DO  END OF SESSION:  PT End of Session - 06/18/24 1408     Visit Number 3    Number of Visits 5    Date for Recertification  06/29/24    Authorization Type U[employer] medicare- auth required    PT Start Time 1408   patient late   PT Stop Time 1440    PT Time Calculation (min) 32 min    Activity Tolerance Patient tolerated treatment well    Behavior During Therapy [facility] for tasks assessed/performed          Past Medical History:  Diagnosis Date   Anemia    Anxiety    Colon polyp 08/28/2012   Tubular adenoma   Dense breast    Depression    Genital warts    GERD (gastroesophageal reflux disease)    Heart murmur    Hemorrhoids    Hx of cardiac catheterization 2009   normal coronary arteries   Hx of menorrhagia    Novasure   Hypokalemia    Hypomagnesemia    Hypophosphatemia    Lactose intolerance in adult    Meniscus degeneration, right    torn r knee/ 10/2019   Mitral valve prolapse    Positive PPD    PSVT (paroxysmal supraventricular tachycardia)    PVC's (premature ventricular contractions)    Sleep apnea    Thyroid  disease    Thyroid  nodules   Past Surgical History:  Procedure Laterality Date   CARDIAC CATHETERIZATION     x2 with normal results per pt   KNEE ARTHROSCOPY W/ MENISCECTOMY Right 08/2023   NOVASURE ABLATION     UPPER GASTROINTESTINAL ENDOSCOPY  01/12/2012   Patient Active Problem List   Diagnosis Date Noted   Postural dizziness with presyncope 04/27/2024   Mixed conductive and sensorineural hearing loss of right ear with restricted hearing of left ear 08/15/2023   Hypokalemia 02/27/2023   Primary hypertension 02/27/2023   Family history of bladder cancer 07/05/2022   Intermittent gross hematuria 07/05/2022   Chronic  sinusitis 08/25/2021   Prediabetes 08/25/2021   Paroxysmal SVT (supraventricular tachycardia) 03/31/2021   Degenerative disc disease, cervical 10/13/2020   Stress incontinence of urine 10/31/2018   History of diverticulosis 05/04/2017   History of PSVT (paroxysmal supraventricular tachycardia) 06/24/2012   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   Multinodular goiter 05/12/2012   Sleep apnea 01/19/2008   IRRITABLE BOWEL SYNDROME 01/18/2008    ONSET DATE: 03/12/24 referral   REFERRING DIAG:  R29.2 (ICD-10-CM) - Hyperreflexia  R20.0 (ICD-10-CM) - Left leg numbness  M54.50,G89.29 (ICD-10-CM) - Chronic midline low back pain without sciatica    THERAPY DIAG:  Unsteadiness on feet  Abnormal posture  Muscle weakness (generalized)  Rationale for Evaluation and Treatment: Rehabilitation  SUBJECTIVE:  SUBJECTIVE STATEMENT: Patient arrives to clinic alone. States she is having some pain in her low back s/p chiropractor adjustments on Saturday. Denies falls.  Pt accompanied by: self  PERTINENT HISTORY: anemia, anxiety, depression, GERD, hypokalemia, hypomagnesemia, hypophosphatemia, PSVT, PVCs, OSA, thyroid  dz  PAIN:  Are you having pain? Yes: NPRS scale: 5/10 Pain location: SIJ, radiating down L LE Pain description: jab stab Aggravating factors: bending forward, pulling her patient up  Relieving factors: stretches that looks like piriformis stretches, spinal decompression   PRECAUTIONS: Fall   PATIENT GOALS: unable to state  OBJECTIVE:  Note: Objective measures were completed at Evaluation unless otherwise noted.  DIAGNOSTIC FINDINGS: 05/12/24 lumbar MRI  IMPRESSION: 1. Mildly transitional lumbosacral anatomy with partially sacralized L5 level. Correlation with radiographs is recommended prior  to any operative intervention.   2. Dominant lumbar spine degenerative finding is widespread facet arthropathy. And a degenerative synovial cyst on the right at L4-L5 projects toward the right lateral recess, with moderate multifactorial right lateral recess and moderate to severe right foraminal stenosis at that level (right L5 and L4 nerve levels, respectively). But with regard to left side radiating pain the symptomatic level might be L3-L4, where a small left paracentral disc protrusion superimposed on the posterior element hypertrophy results in moderate spinal and left lateral recess stenosis. Query Left L4 radiculitis. Mild multifactorial spinal stenosis also at L2-L3.                                                                         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: Patient Education method: Explanation Education comprehension: verbalized understanding and needs further education  HOME EXERCISE PROGRAM: Access Code: [email or web address] URL: [email or web address] Date: 06/11/2024 Prepared by: Delon Pop  Exercises - Seated Slump Nerve Glide  - 1 x daily - 7 x weekly - 3 sets - 8-10 reps - Seated Sciatic Tensioner  - 1 x daily - 7 x weekly - 3 sets - 8-10 reps - Supine Transversus Abdominis Bracing - Hands on Thighs  - 1 x daily - 7 x weekly - 3 sets - 15 reps - Supine Lower Trunk Rotation  - 1 x daily - 7 x weekly - 3 sets - 15 reps - Dead Bug  - 1 x daily - 7 x weekly - 3 sets - 10 reps - Prone Press Up On Elbows  - 1 x daily - 7 x weekly - 3 sets - 15 reps - Standing Anti-Rotation Press with Anchored Resistance  - 1 x daily - 7 x weekly - 3 sets - 8-10  reps  GOALS: Goals reviewed with patient? Yes  SHORT TERM GOALS: = LTG based on PT POC length   LONG TERM GOALS: Target date: 06/29/2024  Pt will be independent with final HEP for improved functional strength  Baseline: to be provided Goal status: INITIAL  2.  Patient will demonstrate ability to lift at least 30lbs from the  floor with no increase in symptoms  Baseline: 20lbs with 1 point increase in pain from baseline Goal status: INITIAL  ASSESSMENT:  CLINICAL IMPRESSION: Patient seen for skilled PT session with emphasis on pain relief and core strengthening. Patient noted to have appropriate joint play in SIJ after manual therapy with decreased reports of pain. Thoracic spine remained with increased tension, though improved. 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 Continue POC.   OBJECTIVE IMPAIRMENTS: decreased knowledge of condition, decreased strength, increased muscle spasms, impaired flexibility, impaired sensation, improper body mechanics, postural dysfunction, and pain.   ACTIVITY LIMITATIONS: carrying, lifting, bending, transfers, bed mobility, bathing, locomotion level, and caring for others  PARTICIPATION LIMITATIONS: interpersonal relationship, driving, shopping, community activity, and occupation  PERSONAL FACTORS: Age, Fitness, Past/current experiences, Profession, and Time since onset of injury/illness/exacerbation are also affecting patient's functional outcome.   REHAB POTENTIAL: Fair time since onset  CLINICAL DECISION MAKING: Stable/uncomplicated  EVALUATION COMPLEXITY: Low  PLAN:  PT FREQUENCY: 1x/week  PT DURATION: 4 weeks  PLANNED INTERVENTIONS: 97164- PT Re-evaluation, 97750- Physical Performance Testing, 97110-Therapeutic exercises, 97530- Therapeutic activity, [id number]- Neuromuscular re-education, 97535- Self Care, [id number]- Manual therapy, [id number]- Gait training, [phone]- Orthotic Initial, [phone]- Orthotic/Prosthetic subsequent, [phone]-  Canalith repositioning, [phone]- Aquatic Therapy, [phone]- Electrical stimulation (unattended), [phone]- Electrical stimulation (manual), [phone] (1-2 muscles), [phone] (3+ muscles)- Dry Needling, Patient/Family education, Balance training, Stair training, Taping, Joint mobilization, Spinal mobilization, Vestibular training, Visual/preceptual remediation/compensation, DME instructions, Cryotherapy, and Moist heat  PLAN FOR NEXT SESSION:  lifting mechanics, core stability, low back stretches, posture retraining, wants to add 1 additional visit to make up at the end, have patient practice lifting mechanics   Delon DELENA Pop, PT Delon DELENA Pop, PT, DPT, CBIS  06/18/2024, 3:08 PM

## 2024-06-25 ENCOUNTER — Ambulatory Visit

## 2024-06-25 VITALS — BP 101/64 | HR 73

## 2024-06-25 DIAGNOSIS — R293 Abnormal posture: Secondary | ICD-10-CM

## 2024-06-25 DIAGNOSIS — R2681 Unsteadiness on feet: Secondary | ICD-10-CM | POA: Diagnosis not present

## 2024-06-25 DIAGNOSIS — M6281 Muscle weakness (generalized): Secondary | ICD-10-CM

## 2024-06-25 NOTE — Therapy (Unsigned)
 OUTPATIENT PHYSICAL THERAPY NEURO TREATMENT   Patient Name: [name] MRN: [id number] DOB:[birth year], 63 y.o., female Today's Date: 06/25/2024   PCP: Emil Aloysius Schaumann, MD REFERRING PROVIDER: Tonita Blanch, DO  PHYSICAL THERAPY DISCHARGE SUMMARY  Visits from Start of Care: ***  Current functional level related to goals / functional outcomes: ***   Remaining deficits: ***   Education / Equipment: ***   Patient agrees to discharge. Patient goals were {OP Goals:25702::met}. Patient is being discharged due to {OP Discharge Reasons:25703::meeting the stated rehab goals.}  END OF SESSION:  PT End of Session - 06/25/24 1620     Visit Number 4    Number of Visits 5    Date for Recertification  06/29/24    Authorization Type UHC medicare- auth required    PT Start Time 1530    PT Stop Time 1610    PT Time Calculation (min) 40 min    Activity Tolerance Patient tolerated treatment well;No increased pain    Behavior During Therapy [facility] for tasks assessed/performed           Past Medical History:  Diagnosis Date   Anemia    Anxiety    Colon polyp 08/28/2012   Tubular adenoma   Dense breast    Depression    Genital warts    GERD (gastroesophageal reflux disease)    Heart murmur    Hemorrhoids    Hx of cardiac catheterization 2009   normal coronary arteries   Hx of menorrhagia    Novasure   Hypokalemia    Hypomagnesemia    Hypophosphatemia    Lactose intolerance in adult    Meniscus degeneration, right    torn r knee/ 10/2019   Mitral valve prolapse    Positive PPD    PSVT (paroxysmal supraventricular tachycardia)    PVC's (premature ventricular contractions)    Sleep apnea    Thyroid  disease    Thyroid  nodules   Past Surgical History:  Procedure Laterality Date   CARDIAC CATHETERIZATION     x2 with normal results per pt   KNEE ARTHROSCOPY W/ MENISCECTOMY Right 08/2023   NOVASURE ABLATION     UPPER GASTROINTESTINAL ENDOSCOPY   01/12/2012   Patient Active Problem List   Diagnosis Date Noted   Postural dizziness with presyncope 04/27/2024   Mixed conductive and sensorineural hearing loss of right ear with restricted hearing of left ear 08/15/2023   Hypokalemia 02/27/2023   Primary hypertension 02/27/2023   Family history of bladder cancer 07/05/2022   Intermittent gross hematuria 07/05/2022   Chronic sinusitis 08/25/2021   Prediabetes 08/25/2021   Paroxysmal SVT (supraventricular tachycardia) 03/31/2021   Degenerative disc disease, cervical 10/13/2020   Stress incontinence of urine 10/31/2018   History of diverticulosis 05/04/2017   History of PSVT (paroxysmal supraventricular tachycardia) 06/24/2012   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   Multinodular goiter 05/12/2012   Sleep apnea 01/19/2008   IRRITABLE BOWEL SYNDROME 01/18/2008    ONSET DATE: 03/12/24 referral   REFERRING DIAG:  R29.2 (ICD-10-CM) - Hyperreflexia  R20.0 (ICD-10-CM) - Left leg numbness  M54.50,G89.29 (ICD-10-CM) - Chronic midline low back pain without sciatica    THERAPY DIAG:  No diagnosis found.  Rationale for Evaluation and Treatment: Rehabilitation  SUBJECTIVE:  SUBJECTIVE STATEMENT: 06/25/2024: Patients states feeling good today and does not have any pain, 0/10. Patient states feeling 85% better than when they started physical therapy.   Patient arrives to clinic alone. States she is having some pain in her low back s/p chiropractor adjustments on Saturday. Denies falls.  Pt accompanied by: self  PERTINENT HISTORY: anemia, anxiety, depression, GERD, hypokalemia, hypomagnesemia, hypophosphatemia, PSVT, PVCs, OSA, thyroid  dz  PAIN:  Are you having pain? Yes: NPRS scale: 5/10 Pain location: SIJ, radiating down L LE Pain description: jab  stab Aggravating factors: bending forward, pulling her patient up  Relieving factors: stretches that looks like piriformis stretches, spinal decompression   PRECAUTIONS: Fall   PATIENT GOALS: unable to state  OBJECTIVE:  Note: Objective measures were completed at Evaluation unless otherwise noted.  DIAGNOSTIC FINDINGS: 05/12/24 lumbar MRI  IMPRESSION: 1. Mildly transitional lumbosacral anatomy with partially sacralized L5 level. Correlation with radiographs is recommended prior to any operative intervention.   2. Dominant lumbar spine degenerative finding is widespread facet arthropathy. And a degenerative synovial cyst on the right at L4-L5 projects toward the right lateral recess, with moderate multifactorial right lateral recess and moderate to severe right foraminal stenosis at that level (right L5 and L4 nerve levels, respectively). But with regard to left side radiating pain the symptomatic level might be L3-L4, where a small left paracentral disc protrusion superimposed on the posterior element hypertrophy results in moderate spinal and left lateral recess stenosis. Query Left L4 radiculitis. Mild multifactorial spinal stenosis also at L2-L3.                                                                         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: Patient Education method: Explanation Education comprehension: verbalized understanding and needs further education  HOME EXERCISE PROGRAM: Access Code:  [email or web address] URL: [email or web address] Date: 06/11/2024 Prepared by: Delon Pop  Exercises - Seated Slump Nerve Glide  - 1 x daily - 7 x weekly - 3 sets - 8-10 reps - Seated Sciatic Tensioner  - 1 x daily - 7 x weekly - 3 sets - 8-10 reps - Supine Transversus Abdominis Bracing - Hands on Thighs  - 1 x daily - 7 x weekly - 3 sets - 15 reps - Supine Lower Trunk Rotation  - 1 x daily - 7 x weekly - 3 sets - 15 reps - Dead Bug  - 1 x daily - 7 x weekly - 3 sets - 10 reps - Prone Press Up On Elbows  - 1 x daily - 7 x weekly - 3 sets - 15 reps - Standing Anti-Rotation Press with Anchored Resistance  - 1 x daily - 7 x weekly - 3 sets - 8-10 reps  GOALS: Goals reviewed with patient? Yes  SHORT TERM  GOALS: = LTG based on PT POC length   LONG TERM GOALS: Target date: 06/29/2024  Pt will be independent with final HEP for improved functional strength  Baseline: to be provided Goal status: INITIAL  2.  Patient will demonstrate ability to lift at least 30lbs from the floor with no increase in symptoms  Baseline: 20lbs with 1 point increase in pain from baseline Goal status: INITIAL  ASSESSMENT:  CLINICAL IMPRESSION: Patient presents to clinic...  seen for skilled PT session with emphasis on pain relief and core strengthening. Patient noted to have appropriate joint play in SIJ after manual therapy with decreased reports of pain. Thoracic spine remained with increased tension, though improved. 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 Continue POC.   OBJECTIVE IMPAIRMENTS: decreased knowledge of condition, decreased strength, increased muscle spasms, impaired flexibility, impaired sensation, improper body mechanics, postural dysfunction, and pain.   ACTIVITY LIMITATIONS: carrying, lifting, bending, transfers, bed mobility, bathing, locomotion level, and caring for others  PARTICIPATION LIMITATIONS: interpersonal relationship, driving, shopping, community  activity, and occupation  PERSONAL FACTORS: Age, Fitness, Past/current experiences, Profession, and Time since onset of injury/illness/exacerbation are also affecting patient's functional outcome.   REHAB POTENTIAL: Fair time since onset  CLINICAL DECISION MAKING: Stable/uncomplicated  EVALUATION COMPLEXITY: Low  PLAN:  PT FREQUENCY: 1x/week  PT DURATION: 4 weeks  PLANNED INTERVENTIONS: 97164- PT Re-evaluation, 97750- Physical Performance Testing, 97110-Therapeutic exercises, 97530- Therapeutic activity, [id number]- Neuromuscular re-education, 97535- Self Care, 02859- Manual therapy, [phone]- Gait training, [phone]- Orthotic Initial, [phone]- Orthotic/Prosthetic subsequent, [phone]- Canalith repositioning, [phone]- Aquatic Therapy, [phone]- Electrical stimulation (unattended), [phone]- Electrical stimulation (manual), [phone] (1-2 muscles), [phone] (3+ muscles)- Dry Needling, Patient/Family education, Balance training, Stair training, Taping, Joint mobilization, Spinal mobilization, Vestibular training, Visual/preceptual remediation/compensation, DME instructions, Cryotherapy, and Moist heat  PLAN FOR NEXT SESSION:  lifting mechanics, core stability, low back stretches, posture retraining, wants to add 1 additional visit to make up at the end, have patient practice lifting mechanics   Emmalene Sherry, Student-PT Delon DELENA Pop, PT, DPT, CBIS  06/25/2024, 4:21 PM

## 2024-07-04 DIAGNOSIS — M542 Cervicalgia: Secondary | ICD-10-CM | POA: Diagnosis not present

## 2024-07-04 DIAGNOSIS — M9903 Segmental and somatic dysfunction of lumbar region: Secondary | ICD-10-CM | POA: Diagnosis not present

## 2024-07-04 DIAGNOSIS — M546 Pain in thoracic spine: Secondary | ICD-10-CM | POA: Diagnosis not present

## 2024-07-04 DIAGNOSIS — M545 Low back pain, unspecified: Secondary | ICD-10-CM | POA: Diagnosis not present

## 2024-07-04 DIAGNOSIS — M9902 Segmental and somatic dysfunction of thoracic region: Secondary | ICD-10-CM | POA: Diagnosis not present

## 2024-07-04 DIAGNOSIS — M9901 Segmental and somatic dysfunction of cervical region: Secondary | ICD-10-CM | POA: Diagnosis not present

## 2024-07-05 DIAGNOSIS — M9903 Segmental and somatic dysfunction of lumbar region: Secondary | ICD-10-CM | POA: Diagnosis not present

## 2024-07-05 DIAGNOSIS — M542 Cervicalgia: Secondary | ICD-10-CM | POA: Diagnosis not present

## 2024-07-05 DIAGNOSIS — M545 Low back pain, unspecified: Secondary | ICD-10-CM | POA: Diagnosis not present

## 2024-07-05 DIAGNOSIS — M546 Pain in thoracic spine: Secondary | ICD-10-CM | POA: Diagnosis not present

## 2024-07-05 DIAGNOSIS — M9902 Segmental and somatic dysfunction of thoracic region: Secondary | ICD-10-CM | POA: Diagnosis not present

## 2024-07-05 DIAGNOSIS — M9901 Segmental and somatic dysfunction of cervical region: Secondary | ICD-10-CM | POA: Diagnosis not present

## 2024-07-09 DIAGNOSIS — M9902 Segmental and somatic dysfunction of thoracic region: Secondary | ICD-10-CM | POA: Diagnosis not present

## 2024-07-09 DIAGNOSIS — M545 Low back pain, unspecified: Secondary | ICD-10-CM | POA: Diagnosis not present

## 2024-07-09 DIAGNOSIS — M9901 Segmental and somatic dysfunction of cervical region: Secondary | ICD-10-CM | POA: Diagnosis not present

## 2024-07-09 DIAGNOSIS — M542 Cervicalgia: Secondary | ICD-10-CM | POA: Diagnosis not present

## 2024-07-09 DIAGNOSIS — M546 Pain in thoracic spine: Secondary | ICD-10-CM | POA: Diagnosis not present

## 2024-07-09 DIAGNOSIS — M9903 Segmental and somatic dysfunction of lumbar region: Secondary | ICD-10-CM | POA: Diagnosis not present

## 2024-07-12 ENCOUNTER — Institutional Professional Consult (permissible substitution): Admitting: Cardiology

## 2024-07-16 ENCOUNTER — Ambulatory Visit (HOSPITAL_COMMUNITY)
Admission: RE | Admit: 2024-07-16 | Discharge: 2024-07-16 | Disposition: A | Discharge status: Home / Self Care | Source: Ambulatory Visit | Attending: Surgery | Admitting: Surgery

## 2024-07-16 ENCOUNTER — Other Ambulatory Visit (HOSPITAL_COMMUNITY): Payer: Self-pay | Admitting: Orthopedic Surgery

## 2024-07-16 DIAGNOSIS — M25571 Pain in right ankle and joints of right foot: Secondary | ICD-10-CM | POA: Diagnosis not present

## 2024-07-16 DIAGNOSIS — M25471 Effusion, right ankle: Secondary | ICD-10-CM | POA: Diagnosis present

## 2024-08-06 ENCOUNTER — Encounter: Payer: Self-pay | Admitting: Cardiovascular Disease

## 2024-08-06 ENCOUNTER — Ambulatory Visit: Payer: Self-pay | Attending: Cardiovascular Disease | Admitting: Cardiovascular Disease

## 2024-08-06 VITALS — BP 116/70 | HR 76 | Ht 66.0 in | Wt 168.0 lb

## 2024-08-06 DIAGNOSIS — R002 Palpitations: Secondary | ICD-10-CM | POA: Diagnosis not present

## 2024-08-06 DIAGNOSIS — R55 Syncope and collapse: Secondary | ICD-10-CM

## 2024-08-06 DIAGNOSIS — I493 Ventricular premature depolarization: Secondary | ICD-10-CM | POA: Diagnosis not present

## 2024-08-06 NOTE — Patient Instructions (Signed)
 Medication Instructions:  Your physician recommends that you continue on your current medications as directed. Please refer to the Current Medication list given to you today.  *If you need a refill on your cardiac medications before your next appointment, please call your pharmacy*  Lab Work: None ordered.  If you have labs (blood work) drawn today and your tests are completely normal, you will receive your results only by: MyChart Message (if you have MyChart) OR A paper copy in the mail If you have any lab test that is abnormal or we need to change your treatment, we will call you to review the results.  Testing/Procedures: None ordered.   Follow-Up: At [facility], you and your health needs are our priority.  As part of our continuing mission to provide you with exceptional heart care, our providers are all part of one team.  This team includes your primary Cardiologist (physician) and Advanced Practice Providers or APPs (Physician Assistants and Nurse Practitioners) who all work together to provide you with the care you need, when you need it.  Your next appointment:   Please contact us  if you decide to move forward with loop recorder implant

## 2024-08-06 NOTE — Progress Notes (Signed)
  Electrophysiology Office Note:    Date:  08/06/2024   ID:  [name], DOB Sep 12, 1961, MRN [id number]  PCP:  [facility] Emil Schanz, MD   [facility] Providers Cardiologist:  Dorn Lesches, MD     Referring MD: Janene Boer, [place]   History of Present Illness:    [name] is a 63 y.o. female with a medical history significant for palpitations, mitral valve prolapse, sleep apnea, referred for arrhythmia management.      History of Present Illness She was hospitalized in August 2025 with symptoms of presyncope and palpitations.  She has had numerous other presentations with the symptoms.  On the day of her admission she had sudden onset lightheadedness and dizziness.  She did not have loss of consciousness. she had multiple recurrences of the symptoms during her admission and telemetry did not show any sustained abnormal rhythm. She wore a monitor in July 2020 1:24 of these episodes.  Did not show any rhythm that would explain her syncope, but she did have several symptom episodes associated with PVCs.         Today, she reports that she is at baseline and has no acute complaints.  EKGs/Labs/Other Studies Reviewed Today:     Echocardiogram:  TTE April 28, 2024 LVEF 60 to 65%.  Normal RV systolic function.  No evidence of significant valvular disease.   Monitors:  30 day monitor July 2024-- my interpretation Sinus rhythm with normal heart rate range.  Rare ectopy.  There were many symptom triggered events associated with PVCs  Stress testing:  EKG:   EKG Interpretation Date/Time:  Monday August 06 2024 14:08:35 EST Ventricular Rate:  76 PR Interval:  154 QRS Duration:  66 QT Interval:  376 QTC Calculation: 423 R Axis:   53  Text Interpretation: Normal sinus rhythm Normal ECG When compared with ECG of 27-Apr-2024 10:18, No significant change was found Confirmed by Nancey Scotts [phone]) on 08/06/2024 2:13:22 PM     Physical  Exam:    VS:  BP 116/70 (BP Location: Right Arm, Patient Position: Sitting, Cuff Size: Large)   Pulse 76   Ht 5' 6 (1.676 m)   Wt 168 lb (76.2 kg)   LMP 09/17/2016 (Approximate)   SpO2 96%   BMI 27.12 kg/m     Wt Readings from Last 3 Encounters:  08/06/24 168 lb (76.2 kg)  05/17/24 194 lb (88 kg)  05/01/24 192 lb (87.1 kg)     GEN: Well nourished, well developed in no acute distress CARDIAC: RRR, no murmurs, rubs, gallops RESPIRATORY:  Normal work of breathing MUSCULOSKELETAL: no edema    ASSESSMENT & PLAN:     Recurrent lightheadedness and presyncope Uncertain if this is associated with arrhythmia We discussed the indication for prolonged monitoring.  I explained the logistics of the loop recorder and risks including infection, bleeding, dislodgment.  She is not ready to commit to having the procedure and will call us  back if she plans to proceed.    Signed, Scotts FORBES Nancey, MD  08/06/2024 2:15 PM    [facility]

## 2024-08-20 ENCOUNTER — Ambulatory Visit: Admitting: Emergency Medicine

## 2024-08-20 ENCOUNTER — Ambulatory Visit: Payer: Self-pay | Admitting: Emergency Medicine

## 2024-08-20 ENCOUNTER — Encounter: Payer: Self-pay | Admitting: Emergency Medicine

## 2024-08-20 VITALS — BP 124/82 | HR 72 | Temp 98.1°F | Ht 66.0 in | Wt 190.0 lb

## 2024-08-20 DIAGNOSIS — K760 Fatty (change of) liver, not elsewhere classified: Secondary | ICD-10-CM | POA: Insufficient documentation

## 2024-08-20 DIAGNOSIS — I10 Essential (primary) hypertension: Secondary | ICD-10-CM

## 2024-08-20 DIAGNOSIS — L299 Pruritus, unspecified: Secondary | ICD-10-CM | POA: Insufficient documentation

## 2024-08-20 LAB — COMPREHENSIVE METABOLIC PANEL WITH GFR
ALT: 24 U/L (ref 0–35)
AST: 22 U/L (ref 0–37)
Albumin: 4 g/dL (ref 3.5–5.2)
Alkaline Phosphatase: 54 U/L (ref 39–117)
BUN: 10 mg/dL (ref 6–23)
CO2: 31 meq/L (ref 19–32)
Calcium: 9 mg/dL (ref 8.4–10.5)
Chloride: 106 meq/L (ref 96–112)
Creatinine, Ser: 0.71 mg/dL (ref 0.40–1.20)
GFR: 89.36 mL/min (ref >60.00)
Glucose, Bld: 87 mg/dL (ref 70–99)
Potassium: 3.8 meq/L (ref 3.5–5.1)
Sodium: 143 meq/L (ref 135–145)
Total Bilirubin: 0.4 mg/dL (ref 0.2–1.2)
Total Protein: 6.5 g/dL (ref 6.0–8.3)

## 2024-08-20 LAB — CBC WITH DIFFERENTIAL/PLATELET
Basophils Absolute: 0 K/uL (ref 0.0–0.1)
Basophils Relative: 0.7 % (ref 0.0–3.0)
Eosinophils Absolute: 0.1 K/uL (ref 0.0–0.7)
Eosinophils Relative: 2.2 % (ref 0.0–5.0)
HCT: 39.9 % (ref 36.0–46.0)
Hemoglobin: 12.5 g/dL (ref 12.0–15.0)
Lymphocytes Relative: 28.1 % (ref 12.0–46.0)
Lymphs Abs: 1.3 K/uL (ref 0.7–4.0)
MCHC: 31.2 g/dL (ref 30.0–36.0)
MCV: 75.4 fl — ABNORMAL LOW (ref 78.0–100.0)
Monocytes Absolute: 0.7 K/uL (ref 0.1–1.0)
Monocytes Relative: 14 % — ABNORMAL HIGH (ref 3.0–12.0)
Neutro Abs: 2.6 K/uL (ref 1.4–7.7)
Neutrophils Relative %: 55 % (ref 43.0–77.0)
Platelets: 170 K/uL (ref 150.0–400.0)
RBC: 5.29 Mil/uL — ABNORMAL HIGH (ref 3.87–5.11)
RDW: 14.6 % (ref 11.5–15.5)
WBC: 4.8 K/uL (ref 4.0–10.5)

## 2024-08-20 LAB — HEMOGLOBIN A1C: Hgb A1c MFr Bld: 6 % (ref 4.6–6.5)

## 2024-08-20 LAB — TSH: TSH: 0.66 u[IU]/mL (ref 0.35–5.50)

## 2024-08-20 LAB — VITAMIN B12: Vitamin B-12: 497 pg/mL (ref 211–911)

## 2024-08-20 LAB — VITAMIN D 25 HYDROXY (VIT D DEFICIENCY, FRACTURES): VITD: 22.98 ng/mL — ABNORMAL LOW (ref 30.00–100.00)

## 2024-08-20 NOTE — Progress Notes (Signed)
 [name] 63 y.o.   Chief Complaint  Patient presents with   Follow-up     itching and pain on right side that comes and goes Pt states that this has been going on for about 3 weeks and she states that the pain radiates in her face arms sometimes chest     HISTORY OF PRESENT ILLNESS: This is a 63 y.o. female complaining of generalized itching that started about 3 weeks ago Mostly neck area with some radiation into her chest and face Denies visible rash No other associated symptoms No other complaints or medical concerns today.  HPI   Prior to Admission medications   Medication Sig Start Date End Date Taking? Authorizing Provider  ALPRAZolam  (XANAX ) 0.5 MG tablet Take 0.5 mg by mouth at bedtime as needed for anxiety. For MRI   Yes [provider]  Ascorbic Acid (VITAMIN C) 1000 MG tablet Take 2,000 mg by mouth at bedtime.   Yes [provider]  aspirin  EC 81 MG EC tablet Take 1 tablet (81 mg total) by mouth daily. 10/21/19  Yes Claudene Pacific, MD  Calcium  Carb-Cholecalciferol (OYSTER SHELL CALCIUM  250+D PO) Take 1 tablet by mouth at bedtime.   Yes [provider]  cholecalciferol (VITAMIN D3) 25 MCG (1000 UNIT) tablet Take 3,000 Units by mouth in the morning.   Yes [provider]  Cyanocobalamin  (VITAMIN B 12 PO) Take 1,000 mcg by mouth in the morning.   Yes [provider]  cyclobenzaprine  (FLEXERIL ) 5 MG tablet Take 1 tablet (5 mg total) by mouth at bedtime as needed for muscle spasms (low back pain). 03/12/24  Yes Patel, Donika K, DO  losartan  (COZAAR ) 25 MG tablet Take 25 mg by mouth at bedtime as needed.   Yes [provider]  magnesium  oxide (MAG-OX) 400 (240 Mg) MG tablet Take 400 mg by mouth daily.   Yes [provider]  metoprolol  succinate (TOPROL -XL) 25 MG 24 hr tablet TAKE 1 TABLET (25 MG) BY MOUTH IN THE MORNING AND AT BEDTIME Patient taking differently: 25 mg in the morning and at bedtime. 09/09/23   Yes Meng, Hao, PA  pantoprazole  (PROTONIX ) 40 MG tablet Take 1 tablet (40 mg total) by mouth daily. 11/09/23  Yes Pyrtle, Gordy HERO, MD  PARoxetine  (PAXIL -CR) 25 MG 24 hr tablet Take 25 mg by mouth daily.   Yes [provider]  potassium chloride  SA (KLOR-CON  M) 20 MEQ tablet TAKE 2 TABLETS (40 MEQ TOTAL) BY MOUTH DAILY. 03/21/24  Yes Nasreen Goedecke, Emil Schanz, MD  TURMERIC PO Take 1 capsule by mouth daily as needed (pain/inflammation).   Yes [provider]    Allergies  Allergen Reactions   Doxycycline  Rash    Patient Active Problem List   Diagnosis Date Noted   Postural dizziness with presyncope 04/27/2024   Mixed conductive and sensorineural hearing loss of right ear with restricted hearing of left ear 08/15/2023   Hypokalemia 02/27/2023   Primary hypertension 02/27/2023   Family history of bladder cancer 07/05/2022   Intermittent gross hematuria 07/05/2022   Chronic sinusitis 08/25/2021   Prediabetes 08/25/2021   Paroxysmal SVT (supraventricular tachycardia) 03/31/2021   Degenerative disc disease, cervical 10/13/2020   Stress incontinence of urine 10/31/2018   History of diverticulosis 05/04/2017   History of PSVT (paroxysmal supraventricular tachycardia) 06/24/2012   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   Multinodular goiter 05/12/2012   Sleep apnea 01/19/2008   IRRITABLE BOWEL SYNDROME 01/18/2008    Past Medical History:  Diagnosis Date  Anemia    Anxiety    Colon polyp 08/28/2012   Tubular adenoma   Dense breast    Depression    Genital warts    GERD (gastroesophageal reflux disease)    Heart murmur    Hemorrhoids    Hx of cardiac catheterization 2009   normal coronary arteries   Hx of menorrhagia    Novasure   Hypokalemia    Hypomagnesemia    Hypophosphatemia    Lactose intolerance in adult    Meniscus degeneration, right    torn r knee/ 10/2019   Mitral valve prolapse    Positive PPD    PSVT (paroxysmal supraventricular tachycardia)    PVC's  (premature ventricular contractions)    Sleep apnea    Thyroid  disease    Thyroid  nodules    Past Surgical History:  Procedure Laterality Date   CARDIAC CATHETERIZATION     x2 with normal results per pt   KNEE ARTHROSCOPY W/ MENISCECTOMY Right 08/2023   NOVASURE ABLATION     UPPER GASTROINTESTINAL ENDOSCOPY  01/12/2012    Social History   Socioeconomic History   Marital status: Divorced    Spouse name: Not on file   Number of children: 2   Years of education: 16   Highest education level: Not on file  Occupational History   Occupation: dialysis Theme Park Manager: [employer]  Tobacco Use   Smoking status: Never   Smokeless tobacco: Never  Vaping Use   Vaping status: Never Used  Substance and Sexual Activity   Alcohol use: No   Drug use: No   Sexual activity: Yes    Birth control/protection: Post-menopausal  Other Topics Concern   Not on file  Social History Narrative   Ms. Clauson is a divorced African American female who works as a teacher, early years/pre who has a number of specialists but no primary care physician. She lived in [place]  [place]  and in [place] for a number of years her mom is from [place]. 16 years of education went to college at [employer] lives at home with her son who is in his 73s no pets   Neg ets tob etoh hx PA   6 hours of sleep   G2P2    TD2010  colonoscopy 2009   Now running  own business    HH of 2    Left handed    Caffeine use: tea sometimes         Social Drivers of Corporate Investment Banker Strain: Not on file  Food Insecurity: No Food Insecurity (04/28/2024)   Hunger Vital Sign    Worried About Running Out of Food in the Last Year: Never true    Ran Out of Food in the Last Year: Never true  Transportation Needs: No Transportation Needs (04/28/2024)   PRAPARE - Administrator, Civil Service (Medical): No    Lack of Transportation (Non-Medical): No  Physical Activity: Not on file  Stress: Not on  file  Social Connections: Not on file  Intimate Partner Violence: Not At Risk (04/28/2024)   Humiliation, Afraid, Rape, and Kick questionnaire    Fear of Current or Ex-Partner: No    Emotionally Abused: No    Physically Abused: No    Sexually Abused: No    Family History  Problem Relation Age of Onset   Heart disease Father        died Mi 29   Diabetes Maternal Grandmother  Hypertension Maternal Grandfather    Diabetes Maternal Grandfather    Heart disease Maternal Grandfather    Colon cancer Maternal Grandfather    Rectal cancer Maternal Grandfather    CAD Paternal Grandfather        CABG   Prostate cancer Neg Hx    Breast cancer Neg Hx    Colon polyps Neg Hx    Esophageal cancer Neg Hx      Review of Systems  Constitutional: Negative.   HENT: Negative.  Negative for congestion and sore throat.   Respiratory: Negative.  Negative for cough and shortness of breath.   Cardiovascular: Negative.  Negative for chest pain and palpitations.  Gastrointestinal:  Negative for abdominal pain, diarrhea, nausea and vomiting.  Genitourinary: Negative.  Negative for dysuria and hematuria.  Skin:  Positive for itching. Negative for rash.  Neurological: Negative.  Negative for dizziness and headaches.    Today's Vitals   08/20/24 1324  BP: 124/82  Pulse: 72  Temp: 98.1 F (36.7 C)  TempSrc: Oral  SpO2: 97%  Weight: 190 lb (86.2 kg)  Height: 5' 6 (1.676 m)   Body mass index is 30.67 kg/m.   Physical Exam Vitals reviewed.  Constitutional:      Appearance: Normal appearance.  HENT:     Head: Normocephalic.     Mouth/Throat:     Mouth: Mucous membranes are moist.     Pharynx: Oropharynx is clear.  Eyes:     Extraocular Movements: Extraocular movements intact.     Pupils: Pupils are equal, round, and reactive to light.  Cardiovascular:     Rate and Rhythm: Normal rate and regular rhythm.     Pulses: Normal pulses.     Heart sounds: Normal heart sounds.  Pulmonary:      Effort: Pulmonary effort is normal.     Breath sounds: Normal breath sounds.  Abdominal:     Palpations: Abdomen is soft.     Tenderness: There is no abdominal tenderness.  Musculoskeletal:     Cervical back: No tenderness.  Lymphadenopathy:     Cervical: No cervical adenopathy.  Skin:    General: Skin is warm and dry.     Capillary Refill: Capillary refill takes less than 2 seconds.  Neurological:     General: No focal deficit present.     Mental Status: She is alert and oriented to person, place, and time.  Psychiatric:        Mood and Affect: Mood normal.        Behavior: Behavior normal.      ASSESSMENT & PLAN: Problem List Items Addressed This Visit       Cardiovascular and Mediastinum   Primary hypertension   BP Readings from Last 3 Encounters:  08/20/24 124/82  08/06/24 116/70  06/25/24 101/64  Well-controlled hypertension Continues metoprolol  succinate 25 mg twice a day Not taking losartan  at present time         Digestive   Hepatic steatosis   Mild as per ultrasound done last April 2025 May be contributing to bile salts accumulation leading to pruritus Recommend blood work today        Musculoskeletal and Integument   Pruritus - Primary   Clinically stable.  No red flag signs or symptoms Unknown triggers Differential diagnosis discussed Recommend blood work today Symptom management discussed Advised to contact the office if clinical picture changes or gets worse over the next several days or weeks.      Relevant Orders  Comprehensive metabolic panel with GFR   CBC with Differential/Platelet   Hemoglobin A1c   TSH   Vitamin B12   VITAMIN D  25 Hydroxy (Vit-D Deficiency, Fractures)   Mitochondrial Antibodies   Patient Instructions  Pruritus Pruritus is an itchy feeling on the skin. One of the most common causes is dry skin, but many different things can cause itching. Most cases of itching do not require medical attention. Sometimes  itchy skin can turn into a rash or a secondary infection. Follow these instructions at home: Skin care  Do not use scented soaps, detergents, perfumes, and cosmetic products. Instead, use gentle, unscented versions of these items. Apply moisturizing creams to your skin frequently, at least twice daily. Apply immediately after bathing while skin is still wet. Take medicines or apply medicated creams only as told by your health care provider. This may include: Corticosteroid cream or topical calcineurin inhibitor. Anti-itch lotions containing urea, camphor, or menthol. Oral antihistamines. Do not take hot showers or baths, which can make itching worse. A short, cool shower may help with itching as long as you apply moisturizing lotion after the shower. Apply a cool, wet cloth (cool compress) to the affected areas. You may take lukewarm baths with one of the following: Epsom salts. You can get these at your local pharmacy or grocery store. Follow the instructions on the packaging. Baking soda. Pour a small amount into the bath as told by your health care provider. Colloidal oatmeal. You can get this at your local pharmacy or grocery store. Follow the instructions on the packaging. Do not scratch your skin. General instructions Avoid wearing tight clothes. Keep a journal to help find out what is causing your itching. Write down: What you eat and drink. What cosmetic products you use. What soaps or detergents you use. What you wear, including jewelry. Use a humidifier. This keeps the air moist, which helps to prevent dry skin. Be aware of any changes in your itchiness. Tell your health care provider about any changes. Contact a health care provider if: The itching does not go away after several days. You notice redness, warmth, or drainage on the skin where you have scratched. You are unusually thirsty or urinating more than normal. Your skin tingles or feels numb. Your skin or the white  parts of your eyes turn yellow (jaundice). You feel weak. You have any of the following: Night sweats. Tiredness (fatigue). Weight loss. Abdominal pain. Summary Pruritus is an itchy feeling on the skin. One of the most common causes is dry skin, but many different conditions and factors can cause itching. Apply moisturizing creams to your skin frequently, at least twice daily. Apply immediately after bathing while skin is still wet. Take medicines or apply medicated creams only as told by your health care provider. Do not take hot showers or baths. Do not use scented soaps, detergents, perfumes, or cosmetic products. Keep a journal to help find out what is causing your itching. This information is not intended to replace advice given to you by your health care provider. Make sure you discuss any questions you have with your health care provider. Document Revised: 10/07/2021 Document Reviewed: 10/07/2021 Elsevier Patient Education  2024 Elsevier Inc.    Emil Schaumann, MD [facility]

## 2024-08-20 NOTE — Assessment & Plan Note (Signed)
 BP Readings from Last 3 Encounters:  08/20/24 124/82  08/06/24 116/70  06/25/24 101/64  Well-controlled hypertension Continues metoprolol  succinate 25 mg twice a day Not taking losartan  at present time

## 2024-08-20 NOTE — Assessment & Plan Note (Signed)
 Clinically stable.  No red flag signs or symptoms Unknown triggers Differential diagnosis discussed Recommend blood work today Symptom management discussed Advised to contact the office if clinical picture changes or gets worse over the next several days or weeks.

## 2024-08-20 NOTE — Assessment & Plan Note (Signed)
 Mild as per ultrasound done last April 2025 May be contributing to bile salts accumulation leading to pruritus Recommend blood work today

## 2024-08-20 NOTE — Patient Instructions (Signed)
 Pruritus Pruritus is an itchy feeling on the skin. One of the most common causes is dry skin, but many different things can cause itching. Most cases of itching do not require medical attention. Sometimes itchy skin can turn into a rash or a secondary infection. Follow these instructions at home: Skin care  Do not use scented soaps, detergents, perfumes, and cosmetic products. Instead, use gentle, unscented versions of these items. Apply moisturizing creams to your skin frequently, at least twice daily. Apply immediately after bathing while skin is still wet. Take medicines or apply medicated creams only as told by your health care provider. This may include: Corticosteroid cream or topical calcineurin inhibitor. Anti-itch lotions containing urea, camphor, or menthol. Oral antihistamines. Do not take hot showers or baths, which can make itching worse. A short, cool shower may help with itching as long as you apply moisturizing lotion after the shower. Apply a cool, wet cloth (cool compress) to the affected areas. You may take lukewarm baths with one of the following: Epsom salts. You can get these at your local pharmacy or grocery store. Follow the instructions on the packaging. Baking soda. Pour a small amount into the bath as told by your health care provider. Colloidal oatmeal. You can get this at your local pharmacy or grocery store. Follow the instructions on the packaging. Do not scratch your skin. General instructions Avoid wearing tight clothes. Keep a journal to help find out what is causing your itching. Write down: What you eat and drink. What cosmetic products you use. What soaps or detergents you use. What you wear, including jewelry. Use a humidifier. This keeps the air moist, which helps to prevent dry skin. Be aware of any changes in your itchiness. Tell your health care provider about any changes. Contact a health care provider if: The itching does not go away after  several days. You notice redness, warmth, or drainage on the skin where you have scratched. You are unusually thirsty or urinating more than normal. Your skin tingles or feels numb. Your skin or the white parts of your eyes turn yellow (jaundice). You feel weak. You have any of the following: Night sweats. Tiredness (fatigue). Weight loss. Abdominal pain. Summary Pruritus is an itchy feeling on the skin. One of the most common causes is dry skin, but many different conditions and factors can cause itching. Apply moisturizing creams to your skin frequently, at least twice daily. Apply immediately after bathing while skin is still wet. Take medicines or apply medicated creams only as told by your health care provider. Do not take hot showers or baths. Do not use scented soaps, detergents, perfumes, or cosmetic products. Keep a journal to help find out what is causing your itching. This information is not intended to replace advice given to you by your health care provider. Make sure you discuss any questions you have with your health care provider. Document Revised: 10/07/2021 Document Reviewed: 10/07/2021 Elsevier Patient Education  2024 [employer].

## 2024-08-22 LAB — MITOCHONDRIAL ANTIBODIES: Mitochondrial M2 Ab, IgG: <=20 U (ref <=20.0)

## 2024-09-16 ENCOUNTER — Other Ambulatory Visit: Payer: Self-pay | Admitting: Physician Assistant

## 2024-10-02 ENCOUNTER — Other Ambulatory Visit: Payer: Self-pay | Admitting: Obstetrics and Gynecology

## 2024-10-02 DIAGNOSIS — Z1231 Encounter for screening mammogram for malignant neoplasm of breast: Secondary | ICD-10-CM

## 2024-10-08 ENCOUNTER — Ambulatory Visit

## 2024-10-22 ENCOUNTER — Ambulatory Visit
